# Patient Record
Sex: Male | Born: 1965 | Race: Black or African American | Hispanic: No | Marital: Single | Smoking: Former smoker
Health system: Southern US, Community
[De-identification: ages and names within clinical notes are randomized; demographics above are authoritative.]

## PROBLEM LIST (undated history)

## (undated) DIAGNOSIS — M1990 Unspecified osteoarthritis, unspecified site: Secondary | ICD-10-CM

## (undated) DIAGNOSIS — E119 Type 2 diabetes mellitus without complications: Secondary | ICD-10-CM

## (undated) DIAGNOSIS — M109 Gout, unspecified: Secondary | ICD-10-CM

## (undated) DIAGNOSIS — I10 Essential (primary) hypertension: Secondary | ICD-10-CM

## (undated) DIAGNOSIS — G4730 Sleep apnea, unspecified: Secondary | ICD-10-CM

## (undated) HISTORY — PX: MASS EXCISION: SHX2000

---

## 2000-11-27 ENCOUNTER — Emergency Department (HOSPITAL_COMMUNITY): Admission: EM | Admit: 2000-11-27 | Discharge: 2000-11-27 | Payer: Self-pay | Admitting: Emergency Medicine

## 2001-06-06 ENCOUNTER — Emergency Department (HOSPITAL_COMMUNITY): Admission: EM | Admit: 2001-06-06 | Discharge: 2001-06-06 | Payer: Self-pay | Admitting: Internal Medicine

## 2003-01-07 ENCOUNTER — Encounter: Payer: Self-pay | Admitting: *Deleted

## 2003-01-07 ENCOUNTER — Emergency Department (HOSPITAL_COMMUNITY): Admission: EM | Admit: 2003-01-07 | Discharge: 2003-01-07 | Payer: Self-pay | Admitting: *Deleted

## 2003-10-27 ENCOUNTER — Emergency Department (HOSPITAL_COMMUNITY): Admission: EM | Admit: 2003-10-27 | Discharge: 2003-10-27 | Payer: Self-pay | Admitting: Emergency Medicine

## 2004-01-07 ENCOUNTER — Emergency Department (HOSPITAL_COMMUNITY): Admission: EM | Admit: 2004-01-07 | Discharge: 2004-01-07 | Payer: Self-pay | Admitting: Emergency Medicine

## 2004-01-15 ENCOUNTER — Ambulatory Visit: Admission: RE | Admit: 2004-01-15 | Discharge: 2004-01-15 | Payer: Self-pay | Admitting: Internal Medicine

## 2004-01-16 ENCOUNTER — Ambulatory Visit (HOSPITAL_COMMUNITY): Admission: RE | Admit: 2004-01-16 | Discharge: 2004-01-16 | Payer: Self-pay | Admitting: Internal Medicine

## 2005-01-22 ENCOUNTER — Ambulatory Visit (HOSPITAL_COMMUNITY): Admission: RE | Admit: 2005-01-22 | Discharge: 2005-01-22 | Payer: Self-pay | Admitting: General Surgery

## 2005-06-14 ENCOUNTER — Ambulatory Visit: Payer: Self-pay | Admitting: Pulmonary Disease

## 2005-06-16 ENCOUNTER — Ambulatory Visit (HOSPITAL_BASED_OUTPATIENT_CLINIC_OR_DEPARTMENT_OTHER): Admission: RE | Admit: 2005-06-16 | Discharge: 2005-06-16 | Payer: Self-pay | Admitting: Pulmonary Disease

## 2005-06-21 ENCOUNTER — Ambulatory Visit: Payer: Self-pay | Admitting: Pulmonary Disease

## 2005-08-24 ENCOUNTER — Emergency Department (HOSPITAL_COMMUNITY): Admission: EM | Admit: 2005-08-24 | Discharge: 2005-08-24 | Payer: Self-pay | Admitting: Emergency Medicine

## 2005-11-17 ENCOUNTER — Emergency Department (HOSPITAL_COMMUNITY): Admission: EM | Admit: 2005-11-17 | Discharge: 2005-11-17 | Payer: Self-pay | Admitting: Emergency Medicine

## 2006-05-19 ENCOUNTER — Emergency Department (HOSPITAL_COMMUNITY): Admission: EM | Admit: 2006-05-19 | Discharge: 2006-05-19 | Payer: Self-pay | Admitting: Emergency Medicine

## 2006-09-06 HISTORY — PX: HERNIA REPAIR: SHX51

## 2007-01-05 ENCOUNTER — Emergency Department (HOSPITAL_COMMUNITY): Admission: EM | Admit: 2007-01-05 | Discharge: 2007-01-05 | Payer: Self-pay | Admitting: Emergency Medicine

## 2008-04-10 ENCOUNTER — Encounter: Admission: RE | Admit: 2008-04-10 | Discharge: 2008-04-10 | Payer: Self-pay | Admitting: Family Medicine

## 2008-05-24 ENCOUNTER — Ambulatory Visit (HOSPITAL_COMMUNITY): Admission: RE | Admit: 2008-05-24 | Discharge: 2008-05-24 | Payer: Self-pay | Admitting: Orthopaedic Surgery

## 2008-05-31 ENCOUNTER — Observation Stay (HOSPITAL_COMMUNITY): Admission: RE | Admit: 2008-05-31 | Discharge: 2008-06-01 | Payer: Self-pay | Admitting: General Surgery

## 2009-08-05 ENCOUNTER — Emergency Department (HOSPITAL_COMMUNITY): Admission: EM | Admit: 2009-08-05 | Discharge: 2009-08-06 | Payer: Self-pay | Admitting: Emergency Medicine

## 2010-03-07 ENCOUNTER — Emergency Department (HOSPITAL_COMMUNITY): Admission: EM | Admit: 2010-03-07 | Discharge: 2010-03-07 | Payer: Self-pay | Admitting: Emergency Medicine

## 2010-09-16 ENCOUNTER — Emergency Department (HOSPITAL_COMMUNITY)
Admission: EM | Admit: 2010-09-16 | Discharge: 2010-09-16 | Payer: Self-pay | Source: Home / Self Care | Admitting: Emergency Medicine

## 2011-01-19 NOTE — H&P (Signed)
[name], [name]                   ACCOUNT NO.:  [id number]   MEDICAL RECORD NO.:  [id number]          PATIENT TYPE:  AMB   LOCATION:  DAY                           FACILITY:  [facility]   PHYSICIAN:  Dalia Heading, M.D.  DATE OF BIRTH:  [birth year]   DATE OF ADMISSION:  DATE OF DISCHARGE:  LH                              HISTORY & PHYSICAL   CHIEF COMPLAINT:  Incisional hernia.   HISTORY:  The patient is a 45 year old black male who is referred for  evaluation and treatment of an incisional hernia.  It has been present  for some time.  He had a double-walled mass removed by Dr. Elpidio Anis  in the past.  No fever or chills have been noted.  It is made worse with  straining.  It is enlarging.   PAST MEDICAL HISTORY:  Includes sleep apnea, though he does not use his  CPAP machine all the time, and morbid obesity.   PAST SURGICAL HISTORY:  As noted above.   CURRENT MEDICATIONS:  None.   ALLERGIES:  No known drug allergies.   SOCIAL HISTORY:  The patient smokes half a pack of cigarettes a day.  He  drinks alcohol socially.   REVIEW OF SYSTEMS:  He denies any chest pain, MI, CVA, and diabetes  mellitus.  He denies any bleeding disorders.   PHYSICAL EXAMINATION:  GENERAL:  The patient is a morbidly obese black  male in no acute distress.  LUNGS:  Clear to auscultation with equal breath sounds bilaterally.  HEART:  Regular rate and rhythm without S3, S4 or murmurs.  ABDOMEN:  Soft, nontender, and nondistended.  No hepatosplenomegaly or  masses noted.  A paraumbilical hernia is noted below a surgical scar.  It is greater than 6 cm in size.  It is reducible.   IMPRESSION:  Incisional hernia.   PLAN:  The patient is scheduled for a laparoscopic incisional  herniorrhaphy with mesh on May 31, 2008.  The risks and benefits  of the procedure including bleeding, infection, recurrence of the  hernia, and the possibility of an open procedure were fully explained to  the patient, gave  informed consent.      Dalia Heading, M.D.  Electronically Signed     MAJ/MEDQ  D:  05/16/2008  T:  05/17/2008  Job:  [id number]   cc:   Annia Friendly. Loleta Chance, MD  Fax: [phone]

## 2011-01-19 NOTE — Op Note (Signed)
[name], [name]                   ACCOUNT NO.:  [id number]   MEDICAL RECORD NO.:  [id number]          PATIENT TYPE:  OBV   LOCATION:  IC12                          FACILITY:  [facility]   PHYSICIAN:  Dalia Heading, M.D.  DATE OF BIRTH:  06/03/1966   DATE OF PROCEDURE:  05/31/2008  DATE OF DISCHARGE:                               OPERATIVE REPORT   PREOPERATIVE DIAGNOSIS:  Incisional hernia.   POSTOPERATIVE DIAGNOSIS:  Incisional hernia.   PROCEDURE:  Laparoscopic incisional herniorrhaphy with mesh.   SURGEON:  Dalia Heading, MD   ANESTHESIA:  General endotracheal.   INDICATIONS:  The patient is a 45 year old morbidly obese black male who  presents with an incisional hernia around the umbilicus.  The risks and  benefits of the procedure including bleeding, infection, bowel injury,  and the possibly of recurrence of the hernia were fully explained to the  patient, gave informed consent.   PROCEDURE NOTE:  The patient was placed in the supine position.  After  induction of general endotracheal anesthesia, the abdomen was prepped  and draped using the usual sterile technique with Betadine.  Surgical  site confirmation was performed.   A Veress needle was inserted into the epigastric region without  difficulty.  The abdomen was then insufflated to 15 mmHg pressure.  An  11-mm trocar was placed in the left upper quadrant under direct  visualization without difficulty.  Additional 5-mm trocar was placed in  the right lower quadrant and left lower quadrants as well as a 10-mm  placed in the right upper quadrant.  The patient did have a loop of  small bowel within the hernia.  Omentum was also reduced from the hernia  sac.  A significant amount of greater omentum was excised and removed  from the abdominal cavity using an EndoCatch bag without difficulty.  Once the hernia contents were reduced, the actual diameter of the hernia  was approximately 7-8 cm in its greatest diameter.  A 15-  x 20-cm  Proceed mesh was then inserted.  0-Prolene stay sutures were placed in  four quadrants.  The mesh was then tacked to the abdominal wall using a  protractor.  The bowel was then inspected and no abnormal bleeding or  evidence of perforation was noted.  All air was then evacuated from the  abdominal cavity prior to removal of the trocars.   All wounds were irrigated with normal saline.  All wounds were injected  with 0.5 mL Sensorcaine.  These stab wounds were closed using Dermabond.  The trocar sites were closed using staples.  Betadine ointment and dry  sterile dressings were applied.   All tape and needle counts were correct at the end of the procedure.  The patient was extubated in the operating room and went back to  recovery room awake in stable condition.   COMPLICATIONS:  None.   SPECIMEN:  None.   BLOOD LOSS:  Minimal.      Dalia Heading, M.D.  Electronically Signed     MAJ/MEDQ  D:  05/31/2008  T:  06/01/2008  Job:  [id number]   cc:   Annia Friendly. Loleta Chance, MD  Fax: [phone]

## 2011-01-22 NOTE — H&P (Signed)
[name], [name]                   ACCOUNT NO.:  [id number]   MEDICAL RECORD NO.:  [id number]          PATIENT TYPE:  AMB   LOCATION:  DAY                           FACILITY:  [facility]   PHYSICIAN:  Jerolyn Shin C. Katrinka Blazing, M.D.   DATE OF BIRTH:  [birth year]   DATE OF ADMISSION:  DATE OF DISCHARGE:  LH                                HISTORY & PHYSICAL   HISTORY OF PRESENT ILLNESS:  A 45 year old male with history of a chronic  mass of the left medial thigh which has gradually increased in size.  The  patient has been followed with this for many months.  He was felt to have  significant inflammation and he has been treated with cephalosporins with  some improvement.  The major inflammation has dissolved.  The patient is  scheduled to have the mass primarily excised and the area closed.  The  irritation that he has is felt to be caused by his large size and the  pressure from his thighs rubbing together.   PAST MEDICAL HISTORY:  1.  Hypertension.  2.  Gouty arthritis.  3.  Morbid obesity.   MEDICATIONS:  1.  Lotrel 5/10 daily.  2.  Indocin 25 mg t.i.d.  3.  Vicodin 5/500 q.i.d. p.r.n.   His hypertension has been poorly controlled because of patient non-  compliance.   PAST SURGICAL HISTORY:  None.   ALLERGIES:  None.   PHYSICAL EXAMINATION:  VITAL SIGNS:  Blood pressure 148/100, pulse 96,  respirations 20.  HEENT:  Unremarkable.  NECK:  Supple.  No JVD, bruits, adenopathy, or thyromegaly.  CHEST:  Clear.  HEART:  Regular rate and rhythm without murmur, gallop or rub.  ABDOMEN:  Obese.  Soft, nontender.  No masses.  EXTREMITIES:  Large fleshy mass of the left medial thigh with irritation of  the tip but without cellulitis.  2+ edema of the legs.   IMPRESSION:  1.  Soft tissue mass, left medial thigh.  2.  Hypertension.  3.  Gouty arthritis.   PLAN:  Excision of mass under anesthesia.      LCS/MEDQ  D:  01/21/2005  T:  01/22/2005  Job:  [id number]

## 2011-01-22 NOTE — Op Note (Signed)
NAMEJATIN, NAUMANN                   ACCOUNT NO.:  [id number]   MEDICAL RECORD NO.:  [id number]          PATIENT TYPE:  AMB   LOCATION:  DAY                           FACILITY:  [facility]   PHYSICIAN:  Jerolyn Shin C. Katrinka Blazing, M.D.   DATE OF BIRTH:  [birth year]   DATE OF PROCEDURE:  01/22/2005  DATE OF DISCHARGE:                                 OPERATIVE REPORT   PREOPERATIVE DIAGNOSIS:  Soft tissue mass, left upper medial thigh with  secondary inflammation.   POSTOPERATIVE DIAGNOSIS:  Soft tissue mass, left upper medial thigh with  secondary inflammation.   PROCEDURE:  Wide excision and primary closure of soft tissue mass, left  upper medial thigh, 4 x 5 cm.   SURGEON:  Dr. Katrinka Blazing.   DESCRIPTION:  The patient was ______________ with monitored anesthesia care  with heavy IV sedation. He was placed in high lithotomy position and was  prepped and draped in a sterile field. Local infiltration with 1% Xylocaine  with epinephrine was carried out. Wide excision around the mass was carried  out in an elliptical pattern, extending into the deep subcutaneous tissue.  Subcutaneous tissue was then closed using running 3-0 Monocryl in layers.  The skin was closed with 2-0 chromic because of the need to not have sutures  in this area because of the maceration from sweat. The patient tolerated the  procedure well. Dressing was placed. He was transferred to a bed and taken  to the post-anesthesia care unit for monitoring.      LCS/MEDQ  D:  01/22/2005  T:  01/22/2005  Job:  [id number]

## 2011-01-22 NOTE — Procedures (Signed)
NAME:  [name], [name] NO.:  [id number]   MEDICAL RECORD NO.:  [id number]          PATIENT TYPE:  OUT   LOCATION:  SLEEP CENTER                 FACILITY:  [facility]   PHYSICIAN:  Coralyn Helling, M.D.      DATE OF BIRTH:  Jan 22, 1966   DATE OF STUDY:  06/16/2005                          MAINTENANCE OF WAKEFULNESS TEST   INDICATION FOR STUDY:  The patient is here with severe obstructive sleep  apnea on CPAP therapy to document any evidence of residual excessive daytime  somnolence.   The patient underwent Maintenance Wakefulness Test which consisted of a  series of 4 nap sessions which were each 20 minutes in duration.   Nap #1 was from 8 a.m. to 8:20 a.m.: No sleep was observed.   Nap #2 was from 10 a.m. to 10:20 a.m.: No sleep was observed.   Nap #3 was from 12 p.m. to 12:20 p.m.: No sleep was observed.   Nap #4 was from 2 p.m. to 2:20 p.m.: No sleep was observed.   IMPRESSION:  This Maintenance Wakefulness Test does not show evidence of  excessive daytime sleep as no sleep was observed in any of the nap sessions.  Clinical correlation will be required.      Coralyn Helling, M.D.  Diplomat, Biomedical engineer of Sleep Medicine  Electronically Signed     VS/MEDQ  D:  06/21/2005 14:50:36  T:  06/21/2005 15:20:25  Job:  [id number]

## 2011-06-07 LAB — CBC
HCT: 42.2
Hemoglobin: 13.8
Hemoglobin: 15
RBC: 4.94
RBC: 5.41
RDW: 15.3
RDW: 15.5

## 2011-06-07 LAB — BASIC METABOLIC PANEL
Calcium: 9.2
GFR calc Af Amer: >60
GFR calc Af Amer: >60
GFR calc non Af Amer: >60
GFR calc non Af Amer: >60
Glucose, Bld: 112 — ABNORMAL HIGH
Glucose, Bld: 93
Potassium: 4
Sodium: 138
Sodium: 138

## 2011-06-07 LAB — DIFFERENTIAL
Basophils Absolute: 0
Eosinophils Relative: 1
Lymphocytes Relative: 19
Lymphs Abs: 2.9
Monocytes Absolute: 1.8 — ABNORMAL HIGH
Monocytes Relative: 12

## 2012-10-16 ENCOUNTER — Other Ambulatory Visit (HOSPITAL_COMMUNITY): Payer: Self-pay | Admitting: Family Medicine

## 2012-10-16 ENCOUNTER — Ambulatory Visit (HOSPITAL_COMMUNITY)
Admission: RE | Admit: 2012-10-16 | Discharge: 2012-10-16 | Disposition: A | Discharge status: Home / Self Care | Payer: BC Managed Care – PPO | Source: Ambulatory Visit | Attending: Family Medicine | Admitting: Family Medicine

## 2012-10-16 DIAGNOSIS — R0602 Shortness of breath: Secondary | ICD-10-CM

## 2012-10-16 LAB — BLOOD GAS, ARTERIAL
Acid-base deficit: 1.5 mmol/L (ref 0.0–2.0)
Bicarbonate: 22.6 mEq/L (ref 20.0–24.0)
O2 Saturation: 95.3 %
TCO2: 19.8 mmol/L (ref 0–100)
pCO2 arterial: 37.4 mmHg (ref 35.0–45.0)
pO2, Arterial: 75.8 mmHg — ABNORMAL LOW (ref 80.0–100.0)

## 2013-05-24 ENCOUNTER — Encounter (HOSPITAL_COMMUNITY): Payer: Self-pay | Admitting: *Deleted

## 2013-05-24 ENCOUNTER — Emergency Department (HOSPITAL_COMMUNITY): Payer: BC Managed Care – PPO

## 2013-05-24 ENCOUNTER — Emergency Department (HOSPITAL_COMMUNITY)
Admission: EM | Admit: 2013-05-24 | Discharge: 2013-05-24 | Disposition: A | Discharge status: Home / Self Care | Payer: BC Managed Care – PPO | Attending: Emergency Medicine | Admitting: Emergency Medicine

## 2013-05-24 DIAGNOSIS — Z862 Personal history of diseases of the blood and blood-forming organs and certain disorders involving the immune mechanism: Secondary | ICD-10-CM | POA: Insufficient documentation

## 2013-05-24 DIAGNOSIS — Z8639 Personal history of other endocrine, nutritional and metabolic disease: Secondary | ICD-10-CM | POA: Insufficient documentation

## 2013-05-24 DIAGNOSIS — M25569 Pain in unspecified knee: Secondary | ICD-10-CM | POA: Insufficient documentation

## 2013-05-24 DIAGNOSIS — M2559 Pain in other specified joint: Secondary | ICD-10-CM | POA: Insufficient documentation

## 2013-05-24 DIAGNOSIS — I10 Essential (primary) hypertension: Secondary | ICD-10-CM | POA: Insufficient documentation

## 2013-05-24 DIAGNOSIS — IMO0001 Reserved for inherently not codable concepts without codable children: Secondary | ICD-10-CM | POA: Insufficient documentation

## 2013-05-24 DIAGNOSIS — G8929 Other chronic pain: Secondary | ICD-10-CM | POA: Insufficient documentation

## 2013-05-24 DIAGNOSIS — E669 Obesity, unspecified: Secondary | ICD-10-CM | POA: Insufficient documentation

## 2013-05-24 DIAGNOSIS — Z79899 Other long term (current) drug therapy: Secondary | ICD-10-CM | POA: Insufficient documentation

## 2013-05-24 HISTORY — DX: Unspecified osteoarthritis, unspecified site: M19.90

## 2013-05-24 HISTORY — DX: Essential (primary) hypertension: I10

## 2013-05-24 HISTORY — DX: Gout, unspecified: M10.9

## 2013-05-24 LAB — CBC WITH DIFFERENTIAL/PLATELET
Basophils Relative: 0 % (ref 0–1)
Eosinophils Relative: 1 % (ref 0–5)
Hemoglobin: 14.7 g/dL (ref 13.0–17.0)
Lymphs Abs: 3.6 10*3/uL (ref 0.7–4.0)
MCH: 27.6 pg (ref 26.0–34.0)
MCV: 86.5 fL (ref 78.0–100.0)
Monocytes Absolute: 1.1 10*3/uL — ABNORMAL HIGH (ref 0.1–1.0)
Monocytes Relative: 8 % (ref 3–12)
RBC: 5.33 MIL/uL (ref 4.22–5.81)
WBC: 13.2 10*3/uL — ABNORMAL HIGH (ref 4.0–10.5)

## 2013-05-24 LAB — BASIC METABOLIC PANEL
CO2: 29 mEq/L (ref 19–32)
Calcium: 9.5 mg/dL (ref 8.4–10.5)
Chloride: 101 mEq/L (ref 96–112)
Creatinine, Ser: 0.83 mg/dL (ref 0.50–1.35)
Glucose, Bld: 132 mg/dL — ABNORMAL HIGH (ref 70–99)
Sodium: 138 mEq/L (ref 135–145)

## 2013-05-24 LAB — URIC ACID: Uric Acid, Serum: 8.6 mg/dL — ABNORMAL HIGH (ref 4.0–7.8)

## 2013-05-24 MED ORDER — IBUPROFEN 800 MG PO TABS: 800.0000 mg | ORAL_TABLET | Freq: Three times a day (TID) | ORAL | Status: DC

## 2013-05-24 MED ORDER — IBUPROFEN 800 MG PO TABS
800.0000 mg | ORAL_TABLET | Freq: Once | ORAL | Status: AC
  Administered 2013-05-24: 800 mg via ORAL
  Filled 2013-05-24: qty 1

## 2013-05-24 MED ORDER — OXYCODONE-ACETAMINOPHEN 5-325 MG PO TABS: 2.0000 | ORAL_TABLET | ORAL | Status: DC | PRN

## 2013-05-24 MED ORDER — OXYCODONE-ACETAMINOPHEN 5-325 MG PO TABS
2.0000 | ORAL_TABLET | Freq: Once | ORAL | Status: AC
  Administered 2013-05-24: 2 via ORAL
  Filled 2013-05-24: qty 2

## 2013-05-24 NOTE — ED Notes (Signed)
Pt states right knee pain x 2 days with hx of arthritis and gout to the knee.

## 2013-05-24 NOTE — ED Provider Notes (Signed)
CSN: [id number]     Arrival date & time 05/24/13  1512 History   First MD Initiated Contact with Patient 05/24/13 1551     Chief Complaint  Patient presents with  . Knee Pain  . Hypertension   (Consider location/radiation/quality/duration/timing/severity/associated sxs/prior Treatment) HPI Comments: R knee pain x 3 days without fall or injury.  Patient is obese, states he has gout and arthritis and this feels similar.  Last "flare" was several months ago when he got a cortisone shot.  Has pain with movement and weight bearing.  No fever or vomiting.  No chest pain, SOB.  States has "borderline" hypertension and has never been on BP meds.  Denies headache or visual changes.  The history is provided by the patient.    Past Medical History  Diagnosis Date  . Gout   . Arthritis   . Hypertension     boarderline   Past Surgical History  Procedure Laterality Date  . Hernia repair     No family history on file. History  Substance Use Topics  . Smoking status: Never Smoker   . Smokeless tobacco: Not on file  . Alcohol Use: No    Review of Systems  Constitutional: Negative for fever, activity change and appetite change.  HENT: Negative for congestion and rhinorrhea.   Respiratory: Negative for cough, chest tightness and shortness of breath.   Cardiovascular: Negative for chest pain.  Gastrointestinal: Negative for nausea, vomiting and abdominal pain.  Genitourinary: Negative for dysuria.  Musculoskeletal: Positive for myalgias and arthralgias. Negative for back pain.  Skin: Negative for rash.  Neurological: Negative for dizziness, weakness and headaches.  A complete 10 system review of systems was obtained and all systems are negative except as noted in the HPI and PMH.    Allergies  Review of patient's allergies indicates no known allergies.  Home Medications   Current Outpatient Rx  Name  Route  Sig  Dispense  Refill  . ibuprofen (ADVIL,MOTRIN) 800 MG tablet   Oral    Take 1 tablet (800 mg total) by mouth 3 (three) times daily.   21 tablet   0   . oxyCODONE-acetaminophen (PERCOCET/ROXICET) 5-325 MG per tablet   Oral   Take 2 tablets by mouth every 4 (four) hours as needed for pain.   15 tablet   0    BP 166/90  Pulse 80  Temp(Src) 98.3 F (36.8 C) (Oral)  Resp 20  Ht 5\' 8"  (1.727 m)  Wt 370 lb (167.831 kg)  BMI 56.27 kg/m2  SpO2 99% Physical Exam  Constitutional: He is oriented to person, place, and time. He appears well-developed and well-nourished. No distress.  HENT:  Head: Normocephalic and atraumatic.  Mouth/Throat: Oropharynx is clear and moist. No oropharyngeal exudate.  Eyes: EOM are normal. Pupils are equal, round, and reactive to light.  Neck: Normal range of motion. Neck supple.  Cardiovascular: Normal rate, regular rhythm and normal heart sounds.   No murmur heard. Pulmonary/Chest: Effort normal and breath sounds normal. No respiratory distress.  Abdominal: Soft. There is no tenderness. There is no rebound and no guarding.  Musculoskeletal: He exhibits no edema and no tenderness.  No warmth or erythema to R knee.  Slightly reduced ROM 2/2 pain.  Intact DP and PT pulses.  Ankle flexion and extension intact.  Neurological: He is oriented to person, place, and time. No cranial nerve deficit. He exhibits normal muscle tone. Coordination normal.  Skin: Skin is warm.    ED Course  Procedures (including critical care time) Labs Review Labs Reviewed  CBC WITH DIFFERENTIAL - Abnormal; Notable for the following:    WBC 13.2 (*)    Neutro Abs 8.4 (*)    Monocytes Absolute 1.1 (*)    All other components within normal limits  BASIC METABOLIC PANEL - Abnormal; Notable for the following:    Glucose, Bld 132 (*)    All other components within normal limits  URIC ACID - Abnormal; Notable for the following:    Uric Acid, Serum 8.6 (*)    All other components within normal limits   Imaging Review Dg Knee Complete 4 Views  Right  05/24/2013   CLINICAL DATA:  Knee pain  EXAM: RIGHT KNEE - COMPLETE 4+ VIEW  COMPARISON:  None.  FINDINGS: Negative for fracture. Medial lateral joint spaces are normal. There is patellar spurring without joint space narrowing. Negative for effusion.  IMPRESSION: Mild patellar spurring. No acute abnormality.   Electronically Signed   By: Marlan Palau M.D.   On: 05/24/2013 16:34    MDM   1. Knee pain, chronic, right   2. Hypertension    Knee pain x 3 days, history of gout and arthritis. Denies trauma, fever, vomiting.  Feels similar to previous gout attacks.  X-rays negative for effusion. Patient's exam shows no warmth, erythema or cellulitis. Range of motion of the knee is mildly reduced. Do not suspect septic joint.  Patient's blood pressure has improved spontaneously to 160/90 systolic. Advised to followup with PCP regarding elevated blood pressure. We'll treat knee pain with short course of pain medication and anti-inflammatories. Return precautions discussed   Glynn Octave, MD 05/24/13 1730

## 2014-05-28 ENCOUNTER — Emergency Department (HOSPITAL_COMMUNITY)
Admission: EM | Admit: 2014-05-28 | Discharge: 2014-05-28 | Disposition: A | Discharge status: Home / Self Care | Payer: 59 | Attending: Emergency Medicine | Admitting: Emergency Medicine

## 2014-05-28 ENCOUNTER — Emergency Department (HOSPITAL_COMMUNITY): Payer: 59

## 2014-05-28 ENCOUNTER — Encounter (HOSPITAL_COMMUNITY): Payer: Self-pay | Admitting: Emergency Medicine

## 2014-05-28 DIAGNOSIS — M10072 Idiopathic gout, left ankle and foot: Secondary | ICD-10-CM

## 2014-05-28 DIAGNOSIS — Z79899 Other long term (current) drug therapy: Secondary | ICD-10-CM | POA: Insufficient documentation

## 2014-05-28 DIAGNOSIS — M109 Gout, unspecified: Secondary | ICD-10-CM | POA: Insufficient documentation

## 2014-05-28 DIAGNOSIS — M129 Arthropathy, unspecified: Secondary | ICD-10-CM | POA: Diagnosis not present

## 2014-05-28 DIAGNOSIS — M79609 Pain in unspecified limb: Secondary | ICD-10-CM | POA: Insufficient documentation

## 2014-05-28 DIAGNOSIS — I10 Essential (primary) hypertension: Secondary | ICD-10-CM | POA: Diagnosis not present

## 2014-05-28 MED ORDER — HYDROMORPHONE HCL 1 MG/ML IJ SOLN
1.0000 mg | Freq: Once | INTRAMUSCULAR | Status: AC
  Administered 2014-05-28: 1 mg via INTRAMUSCULAR
  Filled 2014-05-28: qty 1

## 2014-05-28 MED ORDER — LISINOPRIL 20 MG PO TABS: 20.0000 mg | ORAL_TABLET | Freq: Every day | ORAL | Status: DC

## 2014-05-28 MED ORDER — LISINOPRIL 10 MG PO TABS
20.0000 mg | ORAL_TABLET | Freq: Once | ORAL | Status: AC
  Administered 2014-05-28: 20 mg via ORAL
  Filled 2014-05-28: qty 2

## 2014-05-28 MED ORDER — PREDNISONE 10 MG PO TABS: 20.0000 mg | ORAL_TABLET | Freq: Every day | ORAL | Status: DC

## 2014-05-28 MED ORDER — PREDNISONE 50 MG PO TABS
60.0000 mg | ORAL_TABLET | Freq: Once | ORAL | Status: AC
  Administered 2014-05-28: 60 mg via ORAL
  Filled 2014-05-28 (×2): qty 1

## 2014-05-28 MED ORDER — OXYCODONE-ACETAMINOPHEN 5-325 MG PO TABS: 1.0000 | ORAL_TABLET | Freq: Four times a day (QID) | ORAL | Status: DC | PRN

## 2014-05-28 NOTE — ED Notes (Addendum)
Explained importance of prompt follow up with pcp concerning bp.  Pt verbalized understanding.  Discussed risks of untreated high bp and discussed symptoms that would mean pt needs to return to ER immediately.  EDP aware of bp.

## 2014-05-28 NOTE — ED Notes (Addendum)
Patient complaining of right foot pain since yesterday. States "I think it is gout." Patient's B/P 141/124 at triage.

## 2014-05-28 NOTE — ED Provider Notes (Signed)
CSN: [id number]     Arrival date & time 05/28/14  1324 History   First MD Initiated Contact with Patient 05/28/14 1347     Chief Complaint  Patient presents with  . Foot Pain     (Consider location/radiation/quality/duration/timing/severity/associated sxs/prior Treatment) Patient is a 48 y.o. male presenting with lower extremity pain. The history is provided by the patient (pt complains of left foot pain).  Foot Pain This is a recurrent problem. The current episode started more than 2 days ago. The problem occurs constantly. The problem has not changed since onset.Pertinent negatives include no chest pain, no abdominal pain and no headaches. Nothing aggravates the symptoms. Nothing relieves the symptoms.    Past Medical History  Diagnosis Date  . Gout   . Arthritis   . Hypertension     boarderline   Past Surgical History  Procedure Laterality Date  . Hernia repair     History reviewed. No pertinent family history. History  Substance Use Topics  . Smoking status: Never Smoker   . Smokeless tobacco: Not on file  . Alcohol Use: No    Review of Systems  Constitutional: Negative for appetite change and fatigue.  HENT: Negative for congestion, ear discharge and sinus pressure.   Eyes: Negative for discharge.  Respiratory: Negative for cough.   Cardiovascular: Negative for chest pain.  Gastrointestinal: Negative for abdominal pain and diarrhea.  Genitourinary: Negative for frequency and hematuria.  Musculoskeletal: Negative for back pain.       Pain left foot  Skin: Negative for rash.  Neurological: Negative for seizures and headaches.  Psychiatric/Behavioral: Negative for hallucinations.      Allergies  Review of patient's allergies indicates no known allergies.  Home Medications   Prior to Admission medications   Medication Sig Start Date End Date Taking? Authorizing Provider  allopurinol (ZYLOPRIM) 300 MG tablet Take 300 mg by mouth daily.   Yes Historical  Provider, MD  indomethacin (INDOCIN) 50 MG capsule Take 50 mg by mouth 2 (two) times daily with a meal.   Yes Historical Provider, MD  lisinopril (PRINIVIL,ZESTRIL) 20 MG tablet Take 1 tablet (20 mg total) by mouth daily. 05/28/14   Maudry Diego, MD  oxyCODONE-acetaminophen (PERCOCET/ROXICET) 5-325 MG per tablet Take 1 tablet by mouth every 6 (six) hours as needed. 05/28/14   Maudry Diego, MD  predniSONE (DELTASONE) 10 MG tablet Take 2 tablets (20 mg total) by mouth daily. 05/28/14   Maudry Diego, MD   BP 181/123  Pulse 81  Temp(Src) 97.8 F (36.6 C) (Oral)  Resp 16  Ht 5\' 8"  (1.727 m)  Wt 350 lb (158.759 kg)  BMI 53.23 kg/m2  SpO2 98% Physical Exam  Constitutional: He is oriented to person, place, and time. He appears well-developed.  HENT:  Head: Normocephalic.  Eyes: Conjunctivae are normal.  Neck: No tracheal deviation present.  Cardiovascular:  No murmur heard. Musculoskeletal:  Tender right ankle  Neurological: He is oriented to person, place, and time.  Skin: Skin is warm.  Psychiatric: He has a normal mood and affect.    ED Course  Procedures (including critical care time) Labs Review Labs Reviewed - No data to display  Imaging Review Dg Ankle Complete Right  05/28/2014   CLINICAL DATA:  Right ankle pain and swelling.  EXAM: RIGHT ANKLE - COMPLETE 3+ VIEW  COMPARISON:  None.  FINDINGS: There is no evidence of fracture, dislocation, or joint effusion. No significant joint space narrowing is noted. Minimal osteophyte formation  is noted posteriorly in the talotibial joint. Talar dome appears intact. Soft tissues are unremarkable.  IMPRESSION: Minimal degenerative changes are noted. No acute abnormality seen in the right ankle.   Electronically Signed   By: Sabino Dick M.D.   On: 05/28/2014 15:03     EKG Interpretation None      MDM   Final diagnoses:  Acute idiopathic gout of left ankle  Essential hypertension    Gout,   Prednisone,  Percocet       Maudry Diego, MD 05/28/14 1554

## 2014-05-28 NOTE — Discharge Instructions (Signed)
Follow up with your md in one week.   Get seen sooner if problems

## 2014-08-06 ENCOUNTER — Encounter (HOSPITAL_COMMUNITY): Payer: Self-pay | Admitting: Emergency Medicine

## 2014-08-06 ENCOUNTER — Emergency Department (HOSPITAL_COMMUNITY)
Admission: EM | Admit: 2014-08-06 | Discharge: 2014-08-06 | Disposition: A | Discharge status: Home / Self Care | Payer: 59 | Attending: Emergency Medicine | Admitting: Emergency Medicine

## 2014-08-06 DIAGNOSIS — I10 Essential (primary) hypertension: Secondary | ICD-10-CM | POA: Insufficient documentation

## 2014-08-06 DIAGNOSIS — M1990 Unspecified osteoarthritis, unspecified site: Secondary | ICD-10-CM | POA: Diagnosis not present

## 2014-08-06 DIAGNOSIS — M10072 Idiopathic gout, left ankle and foot: Secondary | ICD-10-CM

## 2014-08-06 DIAGNOSIS — M109 Gout, unspecified: Secondary | ICD-10-CM | POA: Diagnosis present

## 2014-08-06 DIAGNOSIS — Z792 Long term (current) use of antibiotics: Secondary | ICD-10-CM | POA: Insufficient documentation

## 2014-08-06 MED ORDER — INDOMETHACIN 50 MG PO CAPS: 50.0000 mg | ORAL_CAPSULE | Freq: Three times a day (TID) | ORAL | Status: DC

## 2014-08-06 MED ORDER — OXYCODONE-ACETAMINOPHEN 5-325 MG PO TABS: 1.0000 | ORAL_TABLET | Freq: Three times a day (TID) | ORAL | Status: DC | PRN

## 2014-08-06 NOTE — ED Notes (Addendum)
Pt reports history of gout. Pt denies any new or recent injury. Pt reports "it feels like my gout flaring up." pt reports bilateral knee pain and right ankle pain.moderate swelling noted to right ankle.

## 2014-08-06 NOTE — Discharge Instructions (Signed)
Gout °Gout is when your joints become red, sore, and swell (inflamed). This is caused by the buildup of uric acid crystals in the joints. Uric acid is a chemical that is normally in the blood. If the level of uric acid gets too high in the blood, these crystals form in your joints and tissues. Over time, these crystals can form into masses near the joints and tissues. These masses can destroy bone and cause the bone to look misshapen (deformed). °HOME CARE  °· Do not take aspirin for pain. °· Only take medicine as told by your doctor. °· Rest the joint as much as you can. When in bed, keep sheets and blankets off painful areas. °· Keep the sore joints raised (elevated). °· Put warm or cold packs on painful joints. Use of warm or cold packs depends on which works best for you. °· Use crutches if the painful joint is in your leg. °· Drink enough fluids to keep your pee (urine) clear or pale yellow. Limit alcohol, sugary drinks, and drinks with fructose in them. °· Follow your diet instructions. Pay careful attention to how much protein you eat. Include fruits, vegetables, whole grains, and fat-free or low-fat milk products in your daily diet. Talk to your doctor or dietitian about the use of coffee, vitamin C, and cherries. These may help lower uric acid levels. °· Keep a healthy body weight. °GET HELP RIGHT AWAY IF:  °· You have watery poop (diarrhea), throw up (vomit), or have any side effects from medicines. °· You do not feel better in 24 hours, or you are getting worse. °· Your joint becomes suddenly more tender, and you have chills or a fever. °MAKE SURE YOU:  °· Understand these instructions. °· Will watch your condition. °· Will get help right away if you are not doing well or get worse. °Document Released: 06/01/2008 Document Revised: 01/07/2014 Document Reviewed: 04/05/2012 °[employer]® Patient Information ©2015 [employer]. This information is not intended to replace advice given to you by your health care  provider. Make sure you discuss any questions you have with your health care provider. ° °

## 2014-08-06 NOTE — ED Provider Notes (Signed)
CSN: [id number]     Arrival date & time 08/06/14  1400 History   First MD Initiated Contact with Patient 08/06/14 1434     Chief Complaint  Patient presents with  . Gout     (Consider location/radiation/quality/duration/timing/severity/associated sxs/prior Treatment) HPI Comments: Pt comes in today with complaint of flare up of gout in the right ankle. Pt states that he has been having pain and swelling to the area times a couple of days. Hasn't taken anything for the symptoms. Pt is also complaining of bilateral knee pain. He doesn't have a pcp.  The history is provided by the patient. No language interpreter was used.    Past Medical History  Diagnosis Date  . Gout   . Arthritis   . Hypertension     boarderline   Past Surgical History  Procedure Laterality Date  . Hernia repair     History reviewed. No pertinent family history. History  Substance Use Topics  . Smoking status: Never Smoker   . Smokeless tobacco: Not on file  . Alcohol Use: No    Review of Systems  All other systems reviewed and are negative.     Allergies  Review of patient's allergies indicates no known allergies.  Home Medications   Prior to Admission medications   Medication Sig Start Date End Date Taking? Authorizing Provider  indomethacin (INDOCIN) 50 MG capsule Take 1 capsule (50 mg total) by mouth 3 (three) times daily with meals. 08/06/14   Glendell Docker, NP  oxyCODONE-acetaminophen (PERCOCET/ROXICET) 5-325 MG per tablet Take 1-2 tablets by mouth every 8 (eight) hours as needed for moderate pain or severe pain. 08/06/14   Glendell Docker, NP   BP 161/103 mmHg  Pulse 98  Temp(Src) 97.9 F (36.6 C) (Oral)  Resp 20  Ht 5\' 8"  (1.727 m)  Wt 360 lb (163.295 kg)  BMI 54.75 kg/m2  SpO2 98% Physical Exam  Constitutional: He is oriented to person, place, and time. He appears well-developed and well-nourished.  Cardiovascular: Normal rate and regular rhythm.   Musculoskeletal:  Swelling  noted to the right ankle. No redness or warmth noted. No swelling redness or warmth noted to knees bilaterally  Neurological: He is alert and oriented to person, place, and time.  Skin: Skin is warm and dry.  Nursing note and vitals reviewed.   ED Course  Procedures (including critical care time) Labs Review Labs Reviewed - No data to display  Imaging Review No results found.   EKG Interpretation None      MDM   Final diagnoses:  Acute idiopathic gout of left ankle    Pt treated for gout with indocin and hydrocodone;no fevers:discussed high bp with pt    Glendell Docker, NP 08/06/14 [place], MD 08/06/14 1610

## 2014-11-15 ENCOUNTER — Emergency Department (HOSPITAL_COMMUNITY)
Admission: EM | Admit: 2014-11-15 | Discharge: 2014-11-15 | Disposition: A | Discharge status: Home / Self Care | Payer: 59 | Attending: Emergency Medicine | Admitting: Emergency Medicine

## 2014-11-15 ENCOUNTER — Encounter (HOSPITAL_COMMUNITY): Payer: Self-pay | Admitting: *Deleted

## 2014-11-15 DIAGNOSIS — M1990 Unspecified osteoarthritis, unspecified site: Secondary | ICD-10-CM | POA: Insufficient documentation

## 2014-11-15 DIAGNOSIS — W458XXA Other foreign body or object entering through skin, initial encounter: Secondary | ICD-10-CM | POA: Insufficient documentation

## 2014-11-15 DIAGNOSIS — S60351A Superficial foreign body of right thumb, initial encounter: Secondary | ICD-10-CM | POA: Diagnosis not present

## 2014-11-15 DIAGNOSIS — Y9389 Activity, other specified: Secondary | ICD-10-CM | POA: Insufficient documentation

## 2014-11-15 DIAGNOSIS — I10 Essential (primary) hypertension: Secondary | ICD-10-CM | POA: Diagnosis not present

## 2014-11-15 DIAGNOSIS — Y998 Other external cause status: Secondary | ICD-10-CM | POA: Diagnosis not present

## 2014-11-15 DIAGNOSIS — Z79899 Other long term (current) drug therapy: Secondary | ICD-10-CM | POA: Diagnosis not present

## 2014-11-15 DIAGNOSIS — Y9289 Other specified places as the place of occurrence of the external cause: Secondary | ICD-10-CM | POA: Insufficient documentation

## 2014-11-15 DIAGNOSIS — M795 Residual foreign body in soft tissue: Secondary | ICD-10-CM

## 2014-11-15 MED ORDER — LIDOCAINE HCL (PF) 1 % IJ SOLN
INTRAMUSCULAR | Status: AC
  Filled 2014-11-15: qty 5

## 2014-11-15 MED ORDER — CEPHALEXIN 500 MG PO CAPS
500.0000 mg | ORAL_CAPSULE | Freq: Once | ORAL | Status: AC
  Administered 2014-11-15: 500 mg via ORAL

## 2014-11-15 MED ORDER — CEPHALEXIN 500 MG PO CAPS
ORAL_CAPSULE | ORAL | Status: AC
  Filled 2014-11-15: qty 1

## 2014-11-15 MED ORDER — CEPHALEXIN 250 MG PO CAPS: 250.0000 mg | ORAL_CAPSULE | Freq: Two times a day (BID) | ORAL | Status: DC

## 2014-11-15 MED ORDER — CEPHALEXIN 250 MG PO CAPS
250.0000 mg | ORAL_CAPSULE | ORAL | Status: DC
Start: 2014-11-15 — End: 2014-11-15
  Filled 2014-11-15: qty 1

## 2014-11-15 MED ORDER — LIDOCAINE HCL (PF) 1 % IJ SOLN
5.0000 mL | Freq: Once | INTRAMUSCULAR | Status: AC
  Administered 2014-11-15: 5 mL

## 2014-11-15 NOTE — Discharge Instructions (Signed)
Small superficial abscess where the splinter was festering this is been opened and drained been started on antibiotic to prevent further infection.  Please follow-up with your primary care physician Please soaking finger as follows take 1/4 teaspoon of salt and placed in a cup of warm water when the water is typical.  Please soaking finger for approximately 10 minutes, 3-4 times a day

## 2014-11-15 NOTE — ED Notes (Signed)
Pt states splinter to right thumb on Tuesday. Pt states increased pain to the thumb.

## 2014-11-15 NOTE — ED Notes (Signed)
Wooden splinter of rt thumb since Tuesday.  Pt tried to remove without success.

## 2014-11-15 NOTE — ED Provider Notes (Signed)
CSN: [id number]     Arrival date & time 11/15/14  1516 History   First MD Initiated Contact with Patient 11/15/14 1559     Chief Complaint  Patient presents with  . Finger Injury     (Consider location/radiation/quality/duration/timing/severity/associated sxs/prior Treatment) HPI Comments: This patient has a wood splinter in the DIP joint of his right thumb since Tuesday.  He did initially try to remove the splinter with tweezers but was unsuccessful.  Since that time, no treatment has been tried  The history is provided by the patient.    Past Medical History  Diagnosis Date  . Gout   . Arthritis   . Hypertension     boarderline   Past Surgical History  Procedure Laterality Date  . Hernia repair     No family history on file. History  Substance Use Topics  . Smoking status: Never Smoker   . Smokeless tobacco: Not on file  . Alcohol Use: No    Review of Systems  Constitutional: Negative for fever.  Musculoskeletal: Negative for joint swelling.  Skin: Positive for wound.  All other systems reviewed and are negative.     Allergies  Review of patient's allergies indicates no known allergies.  Home Medications   Prior to Admission medications   Medication Sig Start Date End Date Taking? Authorizing Provider  traMADol (ULTRAM) 50 MG tablet Take 50 mg by mouth 2 (two) times daily. 11/07/14  Yes Historical Provider, MD  cephALEXin (KEFLEX) 250 MG capsule Take 1 capsule (250 mg total) by mouth 2 (two) times daily. 11/15/14   Junius Creamer, NP  indomethacin (INDOCIN) 50 MG capsule Take 1 capsule (50 mg total) by mouth 3 (three) times daily with meals. Patient not taking: Reported on 11/15/2014 08/06/14   Glendell Docker, NP  lisinopril (PRINIVIL,ZESTRIL) 5 MG tablet Take 5 mg by mouth daily. 11/07/14   Historical Provider, MD  oxyCODONE-acetaminophen (PERCOCET) 7.5-325 MG per tablet Take 1 tablet by mouth every 4 (four) hours as needed. 10/18/14   Historical Provider, MD   oxyCODONE-acetaminophen (PERCOCET/ROXICET) 5-325 MG per tablet Take 1-2 tablets by mouth every 8 (eight) hours as needed for moderate pain or severe pain. Patient not taking: Reported on 11/15/2014 08/06/14   Glendell Docker, NP  zolpidem (AMBIEN) 10 MG tablet Take 10 mg by mouth at bedtime. 7 day prescribed on 11/07/2014 11/07/14   Historical Provider, MD   BP 133/86 mmHg  Pulse 110  Temp(Src) 98.4 F (36.9 C) (Oral)  Resp 20  Ht 5\' 7"  (1.702 m)  Wt 378 lb (171.46 kg)  BMI 59.19 kg/m2  SpO2 99% Physical Exam  Constitutional: He appears well-developed and well-nourished.  Morbidly obese  HENT:  Head: Normocephalic.  Eyes: Pupils are equal, round, and reactive to light.  Neck: Normal range of motion.  Cardiovascular: Normal rate and regular rhythm.   Pulmonary/Chest: Effort normal.  Musculoskeletal: He exhibits tenderness. He exhibits no edema.  Small festering area medial palmar surface of the right thumb in the DIP joint  Neurological: He is alert.  Skin: Skin is warm. There is erythema.  Nursing note and vitals reviewed.   ED Course  INCISION AND DRAINAGE Date/Time: 11/15/2014 4:48 PM Performed by: Junius Creamer Authorized by: Junius Creamer Consent: Verbal consent obtained. Written consent not obtained. Risks and benefits: risks, benefits and alternatives were discussed Consent given by: patient Patient understanding: patient states understanding of the procedure being performed Patient identity confirmed: verbally with patient Time out: Immediately prior to procedure a "time  out" was called to verify the correct patient, procedure, equipment, support staff and site/side marked as required. Type: abscess Body area: upper extremity Location details: right thumb Anesthesia: local infiltration Local anesthetic: lidocaine 1% without epinephrine Anesthetic total: 1 ml Patient sedated: no Scalpel size: 11 Incision type: single straight Complexity: simple Drainage:  purulent Drainage amount: moderate Wound treatment: wound left open   (including critical care time) Labs Review Labs Reviewed - No data to display  Imaging Review No results found.   EKG Interpretation None     Patient small abscess of his IND with moderate amount of purulent drainage.  He has been started on Keflex, abscess.  Has been present for 5 days after a foreign body splinter.  The patient tried to remove initially after the incident with a straight pin MDM   Final diagnoses:  Foreign body (FB) in soft tissue       Junius Creamer, NP 11/15/14 [place], MD 11/15/14 2004

## 2015-02-12 ENCOUNTER — Emergency Department (HOSPITAL_COMMUNITY)
Admission: EM | Admit: 2015-02-12 | Discharge: 2015-02-12 | Disposition: A | Discharge status: Home / Self Care | Payer: 59 | Attending: Emergency Medicine | Admitting: Emergency Medicine

## 2015-02-12 ENCOUNTER — Encounter (HOSPITAL_COMMUNITY): Payer: Self-pay | Admitting: *Deleted

## 2015-02-12 DIAGNOSIS — Z79899 Other long term (current) drug therapy: Secondary | ICD-10-CM | POA: Insufficient documentation

## 2015-02-12 DIAGNOSIS — M109 Gout, unspecified: Secondary | ICD-10-CM | POA: Insufficient documentation

## 2015-02-12 DIAGNOSIS — M79605 Pain in left leg: Secondary | ICD-10-CM | POA: Diagnosis present

## 2015-02-12 DIAGNOSIS — Y9389 Activity, other specified: Secondary | ICD-10-CM | POA: Insufficient documentation

## 2015-02-12 DIAGNOSIS — M7052 Other bursitis of knee, left knee: Secondary | ICD-10-CM | POA: Diagnosis not present

## 2015-02-12 DIAGNOSIS — I10 Essential (primary) hypertension: Secondary | ICD-10-CM | POA: Insufficient documentation

## 2015-02-12 DIAGNOSIS — M719 Bursopathy, unspecified: Secondary | ICD-10-CM

## 2015-02-12 DIAGNOSIS — M1990 Unspecified osteoarthritis, unspecified site: Secondary | ICD-10-CM | POA: Insufficient documentation

## 2015-02-12 MED ORDER — IBUPROFEN 600 MG PO TABS: 600.0000 mg | ORAL_TABLET | Freq: Four times a day (QID) | ORAL | Status: DC | PRN

## 2015-02-12 MED ORDER — IBUPROFEN 400 MG PO TABS
800.0000 mg | ORAL_TABLET | Freq: Once | ORAL | Status: AC
  Administered 2015-02-12: 800 mg via ORAL
  Filled 2015-02-12: qty 2

## 2015-02-12 NOTE — ED Notes (Signed)
Pain to left leg x past week. No known injury. Pain radiating from lateral side of knee down shin to ankle. Reports as sharp pain.

## 2015-02-12 NOTE — Discharge Instructions (Signed)
Bursitis Bursitis is a swelling and soreness (inflammation) of a fluid-filled sac (bursa) that overlies and protects a joint. It can be caused by injury, overuse of the joint, arthritis or infection. The joints most likely to be affected are the elbows, shoulders, hips and knees. HOME CARE INSTRUCTIONS   Apply ice to the affected area for 15-20 minutes each hour while awake for 2 days. Put the ice in a plastic bag and place a towel between the bag of ice and your skin.  Rest the injured joint as much as possible, but continue to put the joint through a full range of motion, 4 times per day. (The shoulder joint especially becomes rapidly "frozen" if not used.) When the pain lessens, begin normal slow movements and usual activities.  Only take over-the-counter or prescription medicines for pain, discomfort or fever as directed by your caregiver.  Your caregiver may recommend draining the bursa and injecting medicine into the bursa. This may help the healing process.  Follow all instructions for follow-up with your caregiver. This includes any orthopedic referrals, physical therapy and rehabilitation. Any delay in obtaining necessary care could result in a delay or failure of the bursitis to heal and chronic pain. SEEK IMMEDIATE MEDICAL CARE IF:   Your pain increases even during treatment.  You develop an oral temperature above 102 F (38.9 C) and have heat and inflammation over the involved bursa. MAKE SURE YOU:   Understand these instructions.  Will watch your condition.  Will get help right away if you are not doing well or get worse. Document Released: 08/20/2000 Document Revised: 11/15/2011 Document Reviewed: 11/12/2013 [facility] Patient Information 2015 [place], [place]. This information is not intended to replace advice given to you by your health care provider. Make sure you discuss any questions you have with your health care provider.  Please follow instructions  as stated above,  return if new worsening signs or symptoms present. Symptoms continue to persist please follow-up with your primary care provider in one week.

## 2015-02-12 NOTE — ED Provider Notes (Signed)
CSN: [id number]     Arrival date & time 02/12/15  1746 History  This chart was scribed for [name] Sink, PA-C working with No att. providers found by Mercy Moore, ED Scribe. This patient was seen in room TR09C/TR09C and the patient's care was started at 7:26 PM.   Chief Complaint  Patient presents with  . Leg Pain   The history is provided by the patient. No language interpreter was used.   HPI Comments: [name] is a 49 y.o. male with PMHx of gout, arthritis, and Hypertension who presents to the Emergency Department complaining of sharp lower left leg pain ongoing for one week. Patient locates pain at lateral knee which radiates inferiorly to mid lateral calf. Patient reports pain specially with ambulation/bearing weight stating he feels sharp shooting pain on each step. Patient reports pain in his knee with plantar and dorsiflexion of foot. Patient denies direct injury or trauma as causation. Patient reports history of gout in his feet, never in his knee; his last flare was one month ago. Patient states he takes allopurinol for his gout; he admits that he does not take it as directed, but consistently enough. Patient reports that his current knee pain his not consistent with his typical pain. Patient receives steriodal injections in his knees, but states he has not had treatment in approximately four months.  Patient denies fever, chills, warmth or redness at the site, or back pain.   Past Medical History  Diagnosis Date  . Gout   . Arthritis   . Hypertension     boarderline   Past Surgical History  Procedure Laterality Date  . Hernia repair     No family history on file. History  Substance Use Topics  . Smoking status: Never Smoker   . Smokeless tobacco: Not on file  . Alcohol Use: No    Review of Systems  All other systems reviewed and are negative.  Allergies  Review of patient's allergies indicates no known allergies.  Home Medications   Prior to Admission medications    Medication Sig Start Date End Date Taking? Authorizing Provider  ibuprofen (ADVIL,MOTRIN) 600 MG tablet Take 1 tablet (600 mg total) by mouth every 6 (six) hours as needed. 02/12/15   Okey Regal, PA-C  indomethacin (INDOCIN) 50 MG capsule Take 1 capsule (50 mg total) by mouth 3 (three) times daily with meals. Patient not taking: Reported on 11/15/2014 08/06/14   Glendell Docker, NP  lisinopril (PRINIVIL,ZESTRIL) 5 MG tablet Take 5 mg by mouth daily. 11/07/14   Historical Provider, MD  oxyCODONE-acetaminophen (PERCOCET) 7.5-325 MG per tablet Take 1 tablet by mouth every 4 (four) hours as needed. 10/18/14   Historical Provider, MD  oxyCODONE-acetaminophen (PERCOCET/ROXICET) 5-325 MG per tablet Take 1-2 tablets by mouth every 8 (eight) hours as needed for moderate pain or severe pain. Patient not taking: Reported on 11/15/2014 08/06/14   Glendell Docker, NP  traMADol (ULTRAM) 50 MG tablet Take 50 mg by mouth 2 (two) times daily. 11/07/14   Historical Provider, MD  zolpidem (AMBIEN) 10 MG tablet Take 10 mg by mouth at bedtime. 7 day prescribed on 11/07/2014 11/07/14   Historical Provider, MD   Triage Vitals: BP 158/98 mmHg  Pulse 95  Temp(Src) 98 F (36.7 C) (Oral)  Resp 18  Ht 5\' 8"  (1.727 m)  Wt 404 lb (183.253 kg)  BMI 61.44 kg/m2  SpO2 98% Physical Exam  Constitutional: He is oriented to person, place, and time. He appears well-developed and well-nourished.  No distress.  HENT:  Head: Normocephalic and atraumatic.  Eyes: EOM are normal.  Neck: Neck supple. No tracheal deviation present.  Cardiovascular: Normal rate.   Pulmonary/Chest: Effort normal. No respiratory distress.  Musculoskeletal: Normal range of motion.  Left knee tender to palpation lateral aspect at the insertion of the biceps femoris. Morbidly obese difficult exam. Full active range of motion to the left knee, equal bilateral, no numbness or tingling to the distal aspect of the leg knee. No obvious joint effusions. No signs of  infection, knee nontender to palpation. Patient able to ambulate with limited difficulty  Neurological: He is alert and oriented to person, place, and time.  Skin: Skin is warm and dry.  Psychiatric: He has a normal mood and affect. His behavior is normal.  Nursing note and vitals reviewed.   ED Course  Procedures (including critical care time)  COORDINATION OF CARE: 7:43 PM- Patient advised to follow up with PCP if his pain has improved after treatment at home. Discussed treatment plan with patient at bedside and patient agreed to plan.   Labs Review Labs Reviewed - No data to display  Imaging Review No results found.   EKG Interpretation None      MDM   Final diagnoses:  Bursitis    Labs: None indicated  Imaging: None indicated  Consults: None indicated  Therapeutics:  ibuprofen  Assessment: Bursitis  Plan: Patient presented with bursitis to the lateral aspect of his left knee. Patient is morbidly obese making physical exam difficult, but is point tender over the insertion of his biceps. No obvious signs of swelling, redness, decreased range of motion of the joint that would indicate either gout or septic arthritis at this time. No history of trauma that would indicate diagnostic imaging. Patient able to ambulate with limited difficulty or pain. Patient is instructed to use ibuprofen for the next 5 days, follow-up with [facility] her primary care provider for further evaluation if symptoms continue to persist. Instructed to return to the ED if new or worsening signs or symptoms present.    I personally performed the services described in this documentation, which was scribed in my presence. The recorded information has been reviewed and is accurate.    Okey Regal, PA-C 02/13/15 0010  Leonard Schwartz, MD 02/13/15 [phone]

## 2015-02-12 NOTE — ED Notes (Signed)
Pt A&OX4, ambulatory at d/c with steady gait, NAD 

## 2015-10-23 ENCOUNTER — Telehealth: Payer: Self-pay | Admitting: Orthopaedic Surgery

## 2015-10-23 MED ORDER — OXYCODONE HCL 10 MG PO TABS: ORAL_TABLET | ORAL | Status: DC

## 2015-10-23 NOTE — Telephone Encounter (Signed)
[facility] called and states patient just filled oxy 10/325 #150 on 1/23 and brought in oxy 10 script today... Advised to hold script until due date

## 2015-10-23 NOTE — Telephone Encounter (Signed)
Prescription available, patient aware  

## 2015-10-23 NOTE — Telephone Encounter (Signed)
Rx printed

## 2015-10-23 NOTE — Telephone Encounter (Signed)
Patient requesting refill on Percocet 10/325mg  Qty 150 Tablets

## 2015-11-24 ENCOUNTER — Telehealth: Payer: Self-pay | Admitting: Orthopaedic Surgery

## 2015-11-24 MED ORDER — OXYCODONE HCL 10 MG PO TABS: ORAL_TABLET | ORAL | Status: DC

## 2015-11-24 NOTE — Telephone Encounter (Signed)
Patient requesting refill of Oxycodone HCI Qty 150 Tablets

## 2015-11-24 NOTE — Telephone Encounter (Signed)
Rx done. 

## 2015-12-03 ENCOUNTER — Other Ambulatory Visit (HOSPITAL_COMMUNITY): Payer: Self-pay | Admitting: Family Medicine

## 2015-12-03 ENCOUNTER — Ambulatory Visit (HOSPITAL_COMMUNITY)
Admission: RE | Admit: 2015-12-03 | Discharge: 2015-12-03 | Disposition: A | Discharge status: Home / Self Care | Payer: BLUE CROSS/BLUE SHIELD | Source: Ambulatory Visit | Attending: Family Medicine | Admitting: Family Medicine

## 2015-12-03 DIAGNOSIS — R05 Cough: Secondary | ICD-10-CM

## 2015-12-03 DIAGNOSIS — R059 Cough, unspecified: Secondary | ICD-10-CM

## 2015-12-03 DIAGNOSIS — R062 Wheezing: Secondary | ICD-10-CM

## 2015-12-24 ENCOUNTER — Telehealth: Payer: Self-pay | Admitting: Orthopaedic Surgery

## 2015-12-24 MED ORDER — OXYCODONE HCL 10 MG PO TABS: ORAL_TABLET | ORAL | Status: DC

## 2015-12-24 NOTE — Telephone Encounter (Signed)
Oxycodone HCI  10mg   Qty 150 Tablets

## 2015-12-24 NOTE — Telephone Encounter (Signed)
Rx done. 

## 2015-12-31 ENCOUNTER — Ambulatory Visit: Payer: Self-pay | Admitting: Orthopaedic Surgery

## 2016-01-08 ENCOUNTER — Ambulatory Visit (INDEPENDENT_AMBULATORY_CARE_PROVIDER_SITE_OTHER): Payer: BLUE CROSS/BLUE SHIELD | Admitting: Orthopaedic Surgery

## 2016-01-08 ENCOUNTER — Encounter: Payer: Self-pay | Admitting: Orthopaedic Surgery

## 2016-01-08 VITALS — BP 143/102 | HR 98 | Temp 97.0°F | Ht 68.0 in | Wt >= 6400 oz

## 2016-01-08 DIAGNOSIS — M25562 Pain in left knee: Secondary | ICD-10-CM | POA: Diagnosis not present

## 2016-01-08 DIAGNOSIS — M25561 Pain in right knee: Secondary | ICD-10-CM

## 2016-01-08 NOTE — Progress Notes (Signed)
Patient [name]:[id number] [name], male DOB:[birth year], 50 y.o. [id number]  Chief Complaint  Patient presents with  . Follow-up    Bilateral knee pain    HPI  [name] is a 50 y.o. male who is having bilateral knee pain.  He says he has about the same pain as he had had. He has swelling and popping but no new trauma.  He has no giving way and no locking.  He is trying to be active.  He has been taking his medicine.    We had a good talk about his consideration of bariatric surgery for his massive obesity.  His BMI is over 62.  He has pain in his back and his hips.  He has contacted the surgery group in [place] and is signing up for classes.  His family support this.  He has tried all other things and he cannot get his weight down.  I told him the surgery has complications which he needs to be aware of.  But if he sincerely and honestly wants to try it and he has a good attitude and family support, he should go to the classes and then re-assess things.  He will take weight off his knees if he loses the weight.  His blood pressure is well controlled.  He may not need much medicine if he loses the weight.  His diabetes is under control and again it would improve if he loses the weight.   HPI  Body mass index is 62.96 kg/(m^2).  Review of Systems  HENT: Negative for congestion.   Respiratory: Positive for shortness of breath. Negative for cough.   Cardiovascular: Positive for leg swelling. Negative for chest pain.  Endocrine: Negative for cold intolerance.  Musculoskeletal: Positive for myalgias, back pain, joint swelling, arthralgias and gait problem.  Allergic/Immunologic: Positive for environmental allergies.  Neurological: Negative for numbness.    Past Medical History  Diagnosis Date  . Gout   . Arthritis   . Hypertension     boarderline    Past Surgical History  Procedure Laterality Date  . Hernia repair      History reviewed. No pertinent family history.  Social  History Social History  Substance Use Topics  . Smoking status: Never Smoker   . Smokeless tobacco: None  . Alcohol Use: No    No Known Allergies  Current Outpatient Prescriptions  Medication Sig Dispense Refill  . cyclobenzaprine (FLEXERIL) 10 MG tablet Take 10 mg by mouth 3 (three) times daily as needed for muscle spasms.    [name] ibuprofen (ADVIL,MOTRIN) 600 MG tablet Take 1 tablet (600 mg total) by mouth every 6 (six) hours as needed. 30 tablet 0  . indomethacin (INDOCIN) 50 MG capsule Take 1 capsule (50 mg total) by mouth 3 (three) times daily with meals. 21 capsule 0  . lisinopril (PRINIVIL,ZESTRIL) 5 MG tablet Take 5 mg by mouth daily.  0  . metFORMIN (GLUCOPHAGE) 500 MG tablet   2  . naproxen (NAPROSYN) 500 MG tablet Take 500 mg by mouth 2 (two) times daily with a meal.    . Oxycodone HCl 10 MG TABS One every four to six hours as need for pain.  Must last 30 days.  Do not drive a car or operate machinery when taking this medicine. 150 tablet 0  . zolpidem (AMBIEN) 10 MG tablet Take 10 mg by mouth at bedtime. 7 day prescribed on 11/07/2014  0   No current facility-administered medications for this visit.  Physical Exam  Blood pressure 143/102, pulse 98, temperature 97 F (36.1 C), height 5\' 8"  (1.727 m), weight 414 lb (187.789 kg).  Constitutional: overall normal hygiene, normal nutrition, well developed, normal grooming, normal body habitus. Assistive device:none  Musculoskeletal: gait and station Limp left, muscle tone and strength are normal, no tremors or atrophy is present.  .  Neurological: coordination overall normal.  Deep tendon reflex/nerve stretch intact.  Sensation normal.  Cranial nerves II-XII intact.   Skin:   normal overall no scars, lesions, ulcers or rashes. No psoriasis.  Psychiatric: Alert and oriented x 3.  Recent memory intact, remote memory unclear.  Normal mood and affect. Well groomed.  Good eye contact.  Cardiovascular: overall no swelling, no  varicosities, no edema bilaterally, normal temperatures of the legs and arms, no clubbing, cyanosis and good capillary refill.  Lymphatic: palpation is normal. Right Knee Exam   Tenderness  The patient is experiencing tenderness in the medial joint line.  Range of Motion  Extension: 0  Flexion: 100   Muscle Strength   The patient has normal right knee strength.  Tests  McMurray:  Medial - negative  Lachman:  Anterior - negative      Other  Erythema: absent Scars: absent Sensation: normal Pulse: present Swelling: mild   Left Knee Exam   Tenderness  The patient is experiencing tenderness in the medial joint line.  Range of Motion  Extension: 0  Flexion: 90   Muscle Strength   The patient has normal left knee strength.  Tests  McMurray:  Medial - negative  Lachman:  Anterior - negative      Other  Erythema: absent Scars: absent Sensation: normal Pulse: present Swelling: mild      The patient has been educated about the nature of the problem(s) and counseled on treatment options.  The patient appeared to understand what I have discussed and is in agreement with it.  Encounter Diagnoses  Name Primary?  . Right knee pain Yes  . Left knee pain   . Morbid obesity due to excess calories [facility])     PLAN Call if any problems.  Precautions discussed.  Continue current medications.   Return to clinic 3 months

## 2016-01-21 ENCOUNTER — Telehealth: Payer: Self-pay | Admitting: Orthopaedic Surgery

## 2016-01-21 MED ORDER — OXYCODONE HCL 10 MG PO TABS: ORAL_TABLET | ORAL | Status: DC

## 2016-01-21 NOTE — Telephone Encounter (Signed)
Patient called and requested a refill on Oxycodone HCI 10 mgs.  Qty 150   Oxycodone HCI 10 mgs.  Qty   150

## 2016-01-21 NOTE — Telephone Encounter (Signed)
Rx done. 

## 2016-02-18 ENCOUNTER — Telehealth: Payer: Self-pay | Admitting: Orthopaedic Surgery

## 2016-02-18 MED ORDER — OXYCODONE HCL 10 MG PO TABS: ORAL_TABLET | ORAL | Status: DC

## 2016-02-18 NOTE — Telephone Encounter (Signed)
Rx done. 

## 2016-02-18 NOTE — Telephone Encounter (Signed)
Oxycodone HCI 10 mg   Qty 150 Tablets

## 2016-03-16 ENCOUNTER — Ambulatory Visit (HOSPITAL_COMMUNITY)
Admission: RE | Admit: 2016-03-16 | Discharge: 2016-03-16 | Disposition: A | Discharge status: Home / Self Care | Payer: BLUE CROSS/BLUE SHIELD | Source: Ambulatory Visit | Attending: Orthopaedic Surgery | Admitting: Orthopaedic Surgery

## 2016-03-16 ENCOUNTER — Encounter: Payer: Self-pay | Admitting: Orthopaedic Surgery

## 2016-03-16 ENCOUNTER — Ambulatory Visit (INDEPENDENT_AMBULATORY_CARE_PROVIDER_SITE_OTHER): Payer: BLUE CROSS/BLUE SHIELD | Admitting: Orthopaedic Surgery

## 2016-03-16 VITALS — BP 160/103 | HR 92 | Temp 96.8°F | Ht 66.0 in | Wt 365.0 lb

## 2016-03-16 DIAGNOSIS — M25551 Pain in right hip: Secondary | ICD-10-CM | POA: Insufficient documentation

## 2016-03-16 NOTE — Progress Notes (Signed)
Patient AL:[id number] [name], male DOB:02-14-1966, 50 y.o. [id number]  Chief Complaint  Patient presents with  . New Problem    Right Hip pain    HPI  GEDDY PALUZZI is a 50 y.o. male who has right hip pain.  It is over the right trochanteric area and hurts when standing and rolling on it in bed.  He has no redness, no trauma, no paresthesias.  He has used ice and heat and pain medicine with only slight help.  HPI  Body mass index is 58.94 kg/(m^2).  ROS  Review of Systems  HENT: Negative for congestion.   Respiratory: Positive for shortness of breath. Negative for cough.   Cardiovascular: Positive for leg swelling. Negative for chest pain.  Endocrine: Negative for cold intolerance.  Musculoskeletal: Positive for myalgias, back pain, joint swelling, arthralgias and gait problem.  Allergic/Immunologic: Positive for environmental allergies.  Neurological: Negative for numbness.    Past Medical History  Diagnosis Date  . Gout   . Arthritis   . Hypertension     boarderline    Past Surgical History  Procedure Laterality Date  . Hernia repair      No family history on file.  Social History Social History  Substance Use Topics  . Smoking status: Never Smoker   . Smokeless tobacco: Not on file  . Alcohol Use: No    No Known Allergies  Current Outpatient Prescriptions  Medication Sig Dispense Refill  . cyclobenzaprine (FLEXERIL) 10 MG tablet Take 10 mg by mouth 3 (three) times daily as needed for muscle spasms.    [name] lisinopril (PRINIVIL,ZESTRIL) 5 MG tablet Take 5 mg by mouth daily.  0  . metFORMIN (GLUCOPHAGE) 500 MG tablet   2  . naproxen (NAPROSYN) 500 MG tablet Take 500 mg by mouth 2 (two) times daily with a meal.    . Oxycodone HCl 10 MG TABS One every four to six hours as need for pain.  Must last 30 days.  Do not drive a car or operate machinery when taking this medicine. 150 tablet 0   No current facility-administered medications for this visit.     Physical  Exam  Blood pressure 160/103, pulse 92, temperature 96.8 F (36 C), height 5\' 6"  (1.676 m), weight 365 lb (165.563 kg).  Constitutional: overall normal hygiene, normal nutrition, well developed, normal grooming, normal body habitus. Assistive device:none  Musculoskeletal: gait and station Limp right, muscle tone and strength are normal, no tremors or atrophy is present.  .  Neurological: coordination overall normal.  Deep tendon reflex/nerve stretch intact.  Sensation normal.  Cranial nerves II-XII intact.   Skin:   normal overall no scars, lesions, ulcers or rashes. No psoriasis.  Psychiatric: Alert and oriented x 3.  Recent memory intact, remote memory unclear.  Normal mood and affect. Well groomed.  Good eye contact.  Cardiovascular: overall no swelling, no varicosities, no edema bilaterally, normal temperatures of the legs and arms, no clubbing, cyanosis and good capillary refill.  Lymphatic: palpation is normal.  The right hip is tender over the lateral trochanteric bursa area.  He has no redness.  NV intact.  ROM of the hip is full.  Left hip negative.  The patient has been educated about the nature of the problem(s) and counseled on treatment options.  The patient appeared to understand what I have discussed and is in agreement with it.  Encounter Diagnosis  Name Primary?  . Right hip pain Yes   PROCEDURE NOTE:  The  patient request injection, verbal consent was obtained.  The right trochanteric area of the hip was prepped appropriately after time out was performed.   Sterile technique was observed and injection of 1 cc of Depo-Medrol 40 mg with several cc's of plain xylocaine. Anesthesia was provided by ethyl chloride and a 20-gauge needle was used to inject the hip area. The injection was tolerated well.  A band aid dressing was applied.  The patient was advised to apply ice later today and tomorrow to the injection sight as needed.  PLAN Call if any problems.   Precautions discussed.  Continue current medications.   Return to clinic tomorrow.  I will get x-rays from the hospital.  His body habitus limits x-rays here in the office with the portable machine.  He understands.   Electronically Signed Sanjuana Kava, MD 7/11/20172:49 PM

## 2016-03-17 ENCOUNTER — Encounter: Payer: Self-pay | Admitting: Orthopaedic Surgery

## 2016-03-17 ENCOUNTER — Ambulatory Visit (INDEPENDENT_AMBULATORY_CARE_PROVIDER_SITE_OTHER): Payer: BLUE CROSS/BLUE SHIELD | Admitting: Orthopaedic Surgery

## 2016-03-17 VITALS — BP 164/85 | HR 87 | Temp 96.8°F | Ht 66.0 in | Wt 365.0 lb

## 2016-03-17 DIAGNOSIS — M25551 Pain in right hip: Secondary | ICD-10-CM

## 2016-03-17 MED ORDER — OXYCODONE HCL 10 MG PO TABS: ORAL_TABLET | ORAL | Status: DC

## 2016-03-17 NOTE — Progress Notes (Signed)
Patient [name], male DOB:November 19, 1965, 50 y.o. [id number]  Chief Complaint  Patient presents with  . Follow-up    right hip pain    HPI  [name] is a 50 y.o. male who has right hip trochanteric bursitis.  He had injection yesterday and is better today. I sent him to the hospital for x-rays of the right hip which are negative.  I have informed him of the findings.  He is to use ice to the hip and I will see him in one week.  HPI  Body mass index is 58.94 kg/(m^2).  ROS  Review of Systems  HENT: Negative for congestion.   Respiratory: Positive for shortness of breath. Negative for cough.   Cardiovascular: Positive for leg swelling. Negative for chest pain.  Endocrine: Negative for cold intolerance.  Musculoskeletal: Positive for myalgias, back pain, joint swelling, arthralgias and gait problem.  Allergic/Immunologic: Positive for environmental allergies.  Neurological: Negative for numbness.    Past Medical History  Diagnosis Date  . Gout   . Arthritis   . Hypertension     boarderline    Past Surgical History  Procedure Laterality Date  . Hernia repair      History reviewed. No pertinent family history.  Social History Social History  Substance Use Topics  . Smoking status: Never Smoker   . Smokeless tobacco: None  . Alcohol Use: No    No Known Allergies  Current Outpatient Prescriptions  Medication Sig Dispense Refill  . cyclobenzaprine (FLEXERIL) 10 MG tablet Take 10 mg by mouth 3 (three) times daily as needed for muscle spasms.    [name] lisinopril (PRINIVIL,ZESTRIL) 5 MG tablet Take 5 mg by mouth daily.  0  . metFORMIN (GLUCOPHAGE) 500 MG tablet   2  . naproxen (NAPROSYN) 500 MG tablet Take 500 mg by mouth 2 (two) times daily with a meal.    . Oxycodone HCl 10 MG TABS One every four to six hours as need for pain.  Must last 30 days.  Do not drive a car or operate machinery when taking this medicine. 150 tablet 0   No current facility-administered  medications for this visit.     Physical Exam  Blood pressure 164/85, pulse 87, temperature 96.8 F (36 C), height 5\' 6"  (1.676 m), weight 365 lb (165.563 kg).  Constitutional: overall normal hygiene, normal nutrition, well developed, normal grooming, normal body habitus. Assistive device:none  Musculoskeletal: gait and station Limp right, muscle tone and strength are normal, no tremors or atrophy is present.  .  Neurological: coordination overall normal.  Deep tendon reflex/nerve stretch intact.  Sensation normal.  Cranial nerves II-XII intact.   Skin:   normal overall no scars, lesions, ulcers or rashes. No psoriasis.  Psychiatric: Alert and oriented x 3.  Recent memory intact, remote memory unclear.  Normal mood and affect. Well groomed.  Good eye contact.  Cardiovascular: overall no swelling, no varicosities, no edema bilaterally, normal temperatures of the legs and arms, no clubbing, cyanosis and good capillary refill.  Lymphatic: palpation is normal.  His right hip trochanteric area laterally is tender but not as much as yesterday.  ROM of the right hip is full. Left hip negative.  The patient has been educated about the nature of the problem(s) and counseled on treatment options.  The patient appeared to understand what I have discussed and is in agreement with it.  Encounter Diagnoses  Name Primary?  . Right hip pain Yes  . Morbid obesity  due to excess calories [facility])     PLAN Call if any problems.  Precautions discussed.  Continue current medications.   Return to clinic 1 week   Electronically Signed Sanjuana Kava, MD 7/12/201710:58 AM

## 2016-03-24 ENCOUNTER — Ambulatory Visit: Payer: BLUE CROSS/BLUE SHIELD | Admitting: Orthopaedic Surgery

## 2016-03-28 ENCOUNTER — Encounter (HOSPITAL_COMMUNITY): Payer: Self-pay | Admitting: *Deleted

## 2016-03-28 ENCOUNTER — Emergency Department (HOSPITAL_COMMUNITY)
Admission: EM | Admit: 2016-03-28 | Discharge: 2016-03-29 | Disposition: A | Discharge status: Home / Self Care | Payer: BLUE CROSS/BLUE SHIELD | Attending: Emergency Medicine | Admitting: Emergency Medicine

## 2016-03-28 DIAGNOSIS — Z791 Long term (current) use of non-steroidal anti-inflammatories (NSAID): Secondary | ICD-10-CM | POA: Diagnosis not present

## 2016-03-28 DIAGNOSIS — R42 Dizziness and giddiness: Secondary | ICD-10-CM | POA: Diagnosis not present

## 2016-03-28 DIAGNOSIS — Z7984 Long term (current) use of oral hypoglycemic drugs: Secondary | ICD-10-CM | POA: Diagnosis not present

## 2016-03-28 DIAGNOSIS — M1990 Unspecified osteoarthritis, unspecified site: Secondary | ICD-10-CM | POA: Insufficient documentation

## 2016-03-28 DIAGNOSIS — I10 Essential (primary) hypertension: Secondary | ICD-10-CM | POA: Insufficient documentation

## 2016-03-28 DIAGNOSIS — E1165 Type 2 diabetes mellitus with hyperglycemia: Secondary | ICD-10-CM | POA: Diagnosis not present

## 2016-03-28 DIAGNOSIS — Z79899 Other long term (current) drug therapy: Secondary | ICD-10-CM | POA: Diagnosis not present

## 2016-03-28 DIAGNOSIS — R739 Hyperglycemia, unspecified: Secondary | ICD-10-CM

## 2016-03-28 DIAGNOSIS — R197 Diarrhea, unspecified: Secondary | ICD-10-CM | POA: Insufficient documentation

## 2016-03-28 HISTORY — DX: Type 2 diabetes mellitus without complications: E11.9

## 2016-03-28 NOTE — ED Triage Notes (Signed)
Pt c/o dizziness, blurred vision, high blood sugar, reports that symptoms started two days ago and his cbg machine was reading high yesterday,

## 2016-03-29 LAB — COMPREHENSIVE METABOLIC PANEL
ALBUMIN: 3.1 g/dL — AB (ref 3.5–5.0)
ALT: 22 U/L (ref 17–63)
ANION GAP: 5 (ref 5–15)
AST: 16 U/L (ref 15–41)
Alkaline Phosphatase: 113 U/L (ref 38–126)
BILIRUBIN TOTAL: 0.4 mg/dL (ref 0.3–1.2)
BUN: 7 mg/dL (ref 6–20)
CHLORIDE: 103 mmol/L (ref 101–111)
CO2: 24 mmol/L (ref 22–32)
Calcium: 8.3 mg/dL — ABNORMAL LOW (ref 8.9–10.3)
Creatinine, Ser: 0.66 mg/dL (ref 0.61–1.24)
GFR calc Af Amer: >60 mL/min (ref >60)
GLUCOSE: 277 mg/dL — AB (ref 65–99)
POTASSIUM: 3.5 mmol/L (ref 3.5–5.1)
Sodium: 132 mmol/L — ABNORMAL LOW (ref 135–145)
TOTAL PROTEIN: 7 g/dL (ref 6.5–8.1)

## 2016-03-29 LAB — CBC WITH DIFFERENTIAL/PLATELET
BASOS ABS: 0 10*3/uL (ref 0.0–0.1)
BASOS PCT: 0 %
EOS PCT: 1 %
Eosinophils Absolute: 0.1 10*3/uL (ref 0.0–0.7)
HCT: 43.1 % (ref 39.0–52.0)
Hemoglobin: 13.9 g/dL (ref 13.0–17.0)
Lymphocytes Relative: 24 %
Lymphs Abs: 3.2 10*3/uL (ref 0.7–4.0)
MCH: 26.8 pg (ref 26.0–34.0)
MCHC: 32.3 g/dL (ref 30.0–36.0)
MCV: 83.2 fL (ref 78.0–100.0)
MONO ABS: 1.2 10*3/uL — AB (ref 0.1–1.0)
MONOS PCT: 9 %
Neutro Abs: 8.8 10*3/uL — ABNORMAL HIGH (ref 1.7–7.7)
Neutrophils Relative %: 66 %
PLATELETS: 270 10*3/uL (ref 150–400)
RBC: 5.18 MIL/uL (ref 4.22–5.81)
RDW: 14.3 % (ref 11.5–15.5)
WBC: 13.4 10*3/uL — ABNORMAL HIGH (ref 4.0–10.5)

## 2016-03-29 LAB — CBG MONITORING, ED
GLUCOSE-CAPILLARY: 273 mg/dL — AB (ref 65–99)
Glucose-Capillary: 219 mg/dL — ABNORMAL HIGH (ref 65–99)

## 2016-03-29 LAB — URINALYSIS, ROUTINE W REFLEX MICROSCOPIC
Glucose, UA: >1000 mg/dL — AB
LEUKOCYTES UA: NEGATIVE
Nitrite: NEGATIVE
SPECIFIC GRAVITY, URINE: 1.025 (ref 1.005–1.030)
pH: 5.5 (ref 5.0–8.0)

## 2016-03-29 LAB — URINE MICROSCOPIC-ADD ON

## 2016-03-29 MED ORDER — SODIUM CHLORIDE 0.9 % IV BOLUS (SEPSIS)
1000.0000 mL | Freq: Once | INTRAVENOUS | Status: AC
  Administered 2016-03-29: 1000 mL via INTRAVENOUS

## 2016-03-29 NOTE — ED Provider Notes (Signed)
TIME SEEN: 12:20 AM 03/29/2016  CHIEF COMPLAINT:  Chief Complaint  Patient presents with  . Dizziness    HPI:  HPI Comments: [name] is a 50 y.o. male with h/o DM and HTN who presents to the Emergency Department complaining of sudden onset, constant lightheadedness onset two days ago. Pt also has associated blurred vision, diarrhea, and diaphoresis.  He also reports high blood sugar and notes that his CBG machine was reading "high" Intermittently over the past 3 days. Pt has took Metformin PTA with mild relief. He is not on insulin. States he took a dose of metformin at 9 PM. Does report he has had polydipsia, urinary frequency. No dysuria. Pt denies fever, cough, vomiting, nausea, hematochezia, numbness, weakness, abdominal pain, chest pain and shortness of breath.   ROS: See HPI Constitutional: no fever  Eyes: no drainage  ENT: no runny nose   Cardiovascular:  no chest pain  Resp: no SOB  GI: no vomiting GU: no dysuria Integumentary: no rash  Allergy: no hives  Musculoskeletal: no leg swelling  Neurological: no slurred speech ROS otherwise negative  PAST MEDICAL HISTORY/PAST SURGICAL HISTORY:  Past Medical History:  Diagnosis Date  . Arthritis   . Diabetes mellitus without complication ([place])   . Gout   . Hypertension    boarderline    MEDICATIONS:  Prior to Admission medications   Medication Sig Start Date End Date Taking? Authorizing Provider  cyclobenzaprine (FLEXERIL) 10 MG tablet Take 10 mg by mouth 3 (three) times daily as needed for muscle spasms.   Yes Historical Provider, MD  lisinopril (PRINIVIL,ZESTRIL) 5 MG tablet Take 5 mg by mouth daily. 11/07/14  Yes Historical Provider, MD  metFORMIN (GLUCOPHAGE) 500 MG tablet 500 mg 2 (two) times daily with a meal.  01/05/16  Yes Historical Provider, MD  naproxen (NAPROSYN) 500 MG tablet Take 500 mg by mouth 2 (two) times daily with a meal.   Yes Historical Provider, MD  Oxycodone HCl 10 MG TABS One every four to six hours  as need for pain.  Must last 30 days.  Do not drive a car or operate machinery when taking this medicine. 03/17/16  Yes Sanjuana Kava, MD    ALLERGIES:  No Known Allergies  SOCIAL HISTORY:  Social History  Substance Use Topics  . Smoking status: Never Smoker  . Smokeless tobacco: Never Used  . Alcohol use No    FAMILY HISTORY: No family history on file.  EXAM: BP (!) 145/101   Pulse 85   Temp 97.6 F (36.4 C)   Resp 20   Ht 5\' 8"  (1.727 m)   Wt (!) 360 lb (163.3 kg)   SpO2 98%   BMI 54.74 kg/m  CONSTITUTIONAL: Alert and oriented and responds appropriately to questions. Well-appearing; well-nourished. Morbid obesity. HEAD: Normocephalic EYES: Conjunctivae clear, PERRL ENT: normal nose; no rhinorrhea; moist mucous membranes NECK: Supple, no meningismus, no LAD  CARD: RRR; S1 and S2 appreciated; no murmurs, no clicks, no rubs, no gallops RESP: Normal chest excursion without splinting or tachypnea; breath sounds clear and equal bilaterally; no wheezes, no rhonchi, no rales, no hypoxia or respiratory distress, speaking full sentences ABD/GI: Normal bowel sounds; non-distended; soft, non-tender, no rebound, no guarding, no peritoneal signs BACK:  The back appears normal and is non-tender to palpation, there is no CVA tenderness EXT: Normal ROM in all joints; non-tender to palpation; no edema; normal capillary refill; no cyanosis, no calf tenderness or swelling    SKIN: Normal color  for age and race; warm; no rash NEURO: Moves all extremities equally, sensation to light touch intact diffusely, cranial nerves II through XII intact PSYCH: The patient's mood and manner are appropriate. Grooming and personal hygiene are appropriate.  MEDICAL DECISION MAKING: Patient here with blurred vision, lightheadedness, hyperglycemia. Symptoms improving since taking his metformin. Does report 2-3 days of diarrhea but no abdominal pain or vomiting. Denies to me any chest pain or shortness of breath.  No neurologic deficits. Reports he's had similar symptoms with hyperglycemia in the past. Will check labs, urine. EKG shows no ischemic abnormality, arrhythmia, interval changes. We'll check orthostatic vital signs and give IV fluids.  ED PROGRESS: Patient's labs are unremarkable other than hyperglycemia without DKA. Urine shows no sign of infection. Orthostatic vital signs normal.   Patient reports feeling better after IV fluids. No further episodes of diarrhea while in the emergency department. Blood sugar has improved. I recommend close follow-up with his PCP and foramina regular check his blood sugars that he can keep a log of this so his PCP can help decide if he needs to be on anything other than metformin. Have recommended diet changes. He is comfortable with this plan.   At this time, I do not feel there is any life-threatening condition present. I have reviewed and discussed all results (EKG, imaging, lab, urine as appropriate), exam findings with patient. I have reviewed nursing notes and appropriate previous records.  I feel the patient is safe to be discharged home without further emergent workup. Discussed usual and customary return precautions. Patient and family (if present) verbalize understanding and are comfortable with this plan.  Patient will follow-up with their primary care provider. If they do not have a primary care provider, information for follow-up has been provided to them. All questions have been answered.     EKG Interpretation  Date/Time:  Monday March 29 2016 00:02:30 EDT Ventricular Rate:  85 PR Interval:    QRS Duration: 93 QT Interval:  352 QTC Calculation: 419 R Axis:   76 Text Interpretation:  Sinus rhythm Prominent P waves, nondiagnostic Borderline repolarization abnormality Borderline ST elevation, anterior leads No significant change since last tracing since 2008 Confirmed by Tylesha Gibeault,  DO, Lexiana Spindel [phone]) on 03/29/2016 12:14:25 AM        I personally  performed the services described in this documentation, which was scribed in my presence. The recorded information has been reviewed and is accurate.    [place], DO 03/29/16 [phone]

## 2016-03-29 NOTE — Discharge Instructions (Signed)
You may use over-the-counter Imodium for diarrhea. Please return the emergency department if you began having blood in your stool, black stools, abdominal pain, fever.   I recommend that you obtain a new glucometer and check your blood sugar regularly and keep a log of this for your primary care physician so they can decide if you need to be on a new medication or if you need to have your metformin increase. Please avoid fatty meals, foods with a large amount of sugar, sodas.

## 2016-04-13 ENCOUNTER — Encounter: Payer: Self-pay | Admitting: Orthopaedic Surgery

## 2016-04-13 ENCOUNTER — Ambulatory Visit (INDEPENDENT_AMBULATORY_CARE_PROVIDER_SITE_OTHER): Payer: BLUE CROSS/BLUE SHIELD | Admitting: Orthopaedic Surgery

## 2016-04-13 DIAGNOSIS — M25562 Pain in left knee: Secondary | ICD-10-CM | POA: Diagnosis not present

## 2016-04-13 DIAGNOSIS — M25561 Pain in right knee: Secondary | ICD-10-CM

## 2016-04-13 MED ORDER — OXYCODONE HCL 10 MG PO TABS: ORAL_TABLET | ORAL | 0 refills | Status: DC

## 2016-04-13 NOTE — Progress Notes (Signed)
Patient [name], male DOB:06-30-1966, 50 y.o. [name]  Chief Complaint  Patient presents with  . Follow-up    Bilateral knee pain    HPI  [name] is a 50 y.o. male who has chronic pain in both knees.  He has no locking or giving way.  He has no trauma.  He has swelling and popping.  He is severely obese. HPI  Body mass index is 61.18 kg/m.  ROS  Review of Systems  HENT: Negative for congestion.   Respiratory: Positive for shortness of breath. Negative for cough.   Cardiovascular: Positive for leg swelling. Negative for chest pain.  Endocrine: Negative for cold intolerance.  Musculoskeletal: Positive for arthralgias, back pain, gait problem, joint swelling and myalgias.  Allergic/Immunologic: Positive for environmental allergies.  Neurological: Negative for numbness.    Past Medical History:  Diagnosis Date  . Arthritis   . Diabetes mellitus without complication ([place])   . Gout   . Hypertension    boarderline    Past Surgical History:  Procedure Laterality Date  . HERNIA REPAIR      History reviewed. No pertinent family history.  Social History Social History  Substance Use Topics  . Smoking status: Never Smoker  . Smokeless tobacco: Never Used  . Alcohol use No    No Known Allergies  Current Outpatient Prescriptions  Medication Sig Dispense Refill  . cyclobenzaprine (FLEXERIL) 10 MG tablet Take 10 mg by mouth 3 (three) times daily as needed for muscle spasms.    [name] lisinopril (PRINIVIL,ZESTRIL) 5 MG tablet Take 5 mg by mouth daily.  0  . metFORMIN (GLUCOPHAGE) 500 MG tablet 500 mg 2 (two) times daily with a meal.   2  . naproxen (NAPROSYN) 500 MG tablet Take 500 mg by mouth 2 (two) times daily with a meal.    . Oxycodone HCl 10 MG TABS One every four to six hours as need for pain.  Must last 30 days.  Do not drive a car or operate machinery when taking this medicine. 150 tablet 0   No current facility-administered medications for this visit.       Physical Exam  Blood pressure (!) 163/109, pulse (!) 121, temperature 97.3 F (36.3 C), height 5\' 8"  (1.727 m), weight (!) 402 lb 6.4 oz (182.5 kg).  Constitutional: overall normal hygiene, normal nutrition, well developed, normal grooming, normal body habitus. Assistive device:none  Musculoskeletal: gait and station Limp right, muscle tone and strength are normal, no tremors or atrophy is present.  .  Neurological: coordination overall normal.  Deep tendon reflex/nerve stretch intact.  Sensation normal.  Cranial nerves II-XII intact.   Skin:   normal overall no scars, lesions, ulcers or rashes. No psoriasis.  Psychiatric: Alert and oriented x 3.  Recent memory intact, remote memory unclear.  Normal mood and affect. Well groomed.  Good eye contact.  Cardiovascular: overall no swelling, no varicosities, no edema bilaterally, normal temperatures of the legs and arms, no clubbing, cyanosis and good capillary refill.  Lymphatic: palpation is normal.  The bilateral lower extremity is examined:  Inspection:  Thigh:  Non-tender and no defects  Knee has swelling 1+ effusion.                        Joint tenderness is present                        Patient is tender over the medial  joint line  Lower Leg:  Has normal appearance and no tenderness or defects  Ankle:  Non-tender and no defects  Foot:  Non-tender and no defects Range of Motion:  Knee:  Range of motion is: 0-95 right, 0 to 100 left                        Crepitus is  present  Ankle:  Range of motion is normal. Strength and Tone:  The bilateral lower extremity has normal strength and tone. Stability:  Knee:  The knee is stable.  Ankle:  The ankle is stable.    The patient has been educated about the nature of the problem(s) and counseled on treatment options.  The patient appeared to understand what I have discussed and is in agreement with it.  Encounter Diagnoses  Name Primary?  . Morbid obesity due to excess  calories ([place]) Yes  . Right knee pain   . Left knee pain     PLAN Call if any problems.  Precautions discussed.  Continue current medications.   Return to clinic 1 month   Electronically Signed Sanjuana Kava, MD 8/8/20172:47 PM

## 2016-04-23 ENCOUNTER — Ambulatory Visit (INDEPENDENT_AMBULATORY_CARE_PROVIDER_SITE_OTHER): Payer: BLUE CROSS/BLUE SHIELD | Admitting: Urology

## 2016-04-23 DIAGNOSIS — R350 Frequency of micturition: Secondary | ICD-10-CM

## 2016-04-23 DIAGNOSIS — N476 Balanoposthitis: Secondary | ICD-10-CM | POA: Diagnosis not present

## 2016-04-23 DIAGNOSIS — N3941 Urge incontinence: Secondary | ICD-10-CM

## 2016-05-11 ENCOUNTER — Encounter: Payer: Self-pay | Admitting: Orthopaedic Surgery

## 2016-05-11 ENCOUNTER — Ambulatory Visit (INDEPENDENT_AMBULATORY_CARE_PROVIDER_SITE_OTHER): Payer: BLUE CROSS/BLUE SHIELD | Admitting: Orthopaedic Surgery

## 2016-05-11 DIAGNOSIS — M25562 Pain in left knee: Secondary | ICD-10-CM

## 2016-05-11 DIAGNOSIS — M25561 Pain in right knee: Secondary | ICD-10-CM

## 2016-05-11 MED ORDER — OXYCODONE HCL 10 MG PO TABS: ORAL_TABLET | ORAL | 0 refills | Status: DC

## 2016-05-11 NOTE — Progress Notes (Signed)
Patient AL:[id number] [name], male DOB:[birth year], 50 y.o. [id number]  Chief Complaint  Patient presents with  . Follow-up    Bilateral knee pain    HPI  [name] is a 50 y.o. male who has chronic pain of the knees bilaterally.  He has more pain on the right knee.  He has no giving way, no locking.  He has swelling and popping.  He is markedly overweight.  I have talked to him about losing weight gradually.  He says he has an appointment with his family doctor soon for possible gastric bypass evaluation. HPI  Body mass index is 58.54 kg/m.  ROS  Review of Systems  HENT: Negative for congestion.   Respiratory: Positive for shortness of breath. Negative for cough.   Cardiovascular: Positive for leg swelling. Negative for chest pain.  Endocrine: Negative for cold intolerance.  Musculoskeletal: Positive for arthralgias, back pain, gait problem, joint swelling and myalgias.  Allergic/Immunologic: Positive for environmental allergies.  Neurological: Negative for numbness.    Past Medical History:  Diagnosis Date  . Arthritis   . Diabetes mellitus without complication ([place])   . Gout   . Hypertension    boarderline    Past Surgical History:  Procedure Laterality Date  . HERNIA REPAIR      History reviewed. No pertinent family history.  Social History Social History  Substance Use Topics  . Smoking status: Never Smoker  . Smokeless tobacco: Never Used  . Alcohol use No    No Known Allergies  Current Outpatient Prescriptions  Medication Sig Dispense Refill  . cyclobenzaprine (FLEXERIL) 10 MG tablet Take 10 mg by mouth 3 (three) times daily as needed for muscle spasms.    [name] lisinopril (PRINIVIL,ZESTRIL) 5 MG tablet Take 5 mg by mouth daily.  0  . metFORMIN (GLUCOPHAGE) 500 MG tablet 500 mg 2 (two) times daily with a meal.   2  . naproxen (NAPROSYN) 500 MG tablet Take 500 mg by mouth 2 (two) times daily with a meal.    . Oxycodone HCl 10 MG TABS One every four to six  hours as need for pain.  Must last 30 days.  Do not drive a car or operate machinery when taking this medicine. 150 tablet 0   No current facility-administered medications for this visit.      Physical Exam  Blood pressure (!) 178/113, pulse 82, height 5\' 8"  (1.727 m), weight (!) 385 lb (174.6 kg).  Constitutional: overall normal hygiene, normal nutrition, well developed, normal grooming, normal body habitus. Assistive device:none  Musculoskeletal: gait and station Limp right, muscle tone and strength are normal, no tremors or atrophy is present.  .  Neurological: coordination overall normal.  Deep tendon reflex/nerve stretch intact.  Sensation normal.  Cranial nerves II-XII intact.   Skin:   normal overall no scars, lesions, ulcers or rashes. No psoriasis.  Psychiatric: Alert and oriented x 3.  Recent memory intact, remote memory unclear.  Normal mood and affect. Well groomed.  Good eye contact.  Cardiovascular: overall no swelling, no varicosities, no edema bilaterally, normal temperatures of the legs and arms, no clubbing, cyanosis and good capillary refill.  Lymphatic: palpation is normal.  The right lower extremity is examined:  Inspection:  Thigh:  Non-tender and no defects  Knee has swelling 1+ effusion.                        Joint tenderness is present  Patient is tender over the medial joint line  Lower Leg:  Has normal appearance and no tenderness or defects  Ankle:  Non-tender and no defects  Foot:  Non-tender and no defects Range of Motion:  Knee:  Range of motion is: 0-100                        Crepitus is  present  Ankle:  Range of motion is normal. Strength and Tone:  The right lower extremity has normal strength and tone. Stability:  Knee:  The knee is stable.  Ankle:  The ankle is stable.    The patient has been educated about the nature of the problem(s) and counseled on treatment options.  The patient appeared to understand  what I have discussed and is in agreement with it.  Encounter Diagnoses  Name Primary?  . Morbid obesity due to excess calories ([place]) Yes  . Right knee pain   . Left knee pain     PLAN Call if any problems.  Precautions discussed.  Continue current medications.   Return to clinic 1 month   Electronically Signed Sanjuana Kava, MD 9/5/20173:36 PM

## 2016-05-28 ENCOUNTER — Ambulatory Visit (INDEPENDENT_AMBULATORY_CARE_PROVIDER_SITE_OTHER): Payer: BLUE CROSS/BLUE SHIELD | Admitting: Urology

## 2016-05-28 DIAGNOSIS — N5201 Erectile dysfunction due to arterial insufficiency: Secondary | ICD-10-CM | POA: Diagnosis not present

## 2016-05-28 DIAGNOSIS — R350 Frequency of micturition: Secondary | ICD-10-CM

## 2016-05-28 DIAGNOSIS — N481 Balanitis: Secondary | ICD-10-CM | POA: Diagnosis not present

## 2016-06-02 ENCOUNTER — Ambulatory Visit (INDEPENDENT_AMBULATORY_CARE_PROVIDER_SITE_OTHER): Payer: BLUE CROSS/BLUE SHIELD | Admitting: Orthopaedic Surgery

## 2016-06-02 ENCOUNTER — Encounter: Payer: Self-pay | Admitting: Orthopaedic Surgery

## 2016-06-02 DIAGNOSIS — M25562 Pain in left knee: Secondary | ICD-10-CM

## 2016-06-02 DIAGNOSIS — M25561 Pain in right knee: Secondary | ICD-10-CM | POA: Diagnosis not present

## 2016-06-02 MED ORDER — OXYCODONE HCL 10 MG PO TABS: ORAL_TABLET | ORAL | 0 refills | Status: DC

## 2016-06-02 NOTE — Progress Notes (Signed)
Patient [name]:[id number] QUINTARIUS DRYSDALE, male DOB:12-May-1966, 50 y.o. [id number]  Chief Complaint  Patient presents with  . Follow-up    bilateral knee pain    HPI  [name] is a 50 y.o. male who has bilateral knee pain.  He has no new trauma.  He has popping and swelling but no giving way or locking. He is very morbidly obese.  He declines to get on scale today. HPI  There is no height or weight on file to calculate BMI.  ROS  Review of Systems  Past Medical History:  Diagnosis Date  . Arthritis   . Diabetes mellitus without complication ([place])   . Gout   . Hypertension    boarderline    Past Surgical History:  Procedure Laterality Date  . HERNIA REPAIR      No family history on file.  Social History Social History  Substance Use Topics  . Smoking status: Never Smoker  . Smokeless tobacco: Never Used  . Alcohol use No    No Known Allergies  Current Outpatient Prescriptions  Medication Sig Dispense Refill  . cyclobenzaprine (FLEXERIL) 10 MG tablet Take 10 mg by mouth 3 (three) times daily as needed for muscle spasms.    [name] lisinopril (PRINIVIL,ZESTRIL) 5 MG tablet Take 5 mg by mouth daily.  0  . metFORMIN (GLUCOPHAGE) 500 MG tablet 500 mg 2 (two) times daily with a meal.   2  . naproxen (NAPROSYN) 500 MG tablet Take 500 mg by mouth 2 (two) times daily with a meal.    . Oxycodone HCl 10 MG TABS One every four to six hours as need for pain.  Must last 30 days.  Do not drive a car or operate machinery when taking this medicine. 150 tablet 0   No current facility-administered medications for this visit.      Physical Exam  Blood pressure 126/77, pulse 86, temperature (!) 96.8 F (36 C), height 5\' 8"  (1.727 m).  Constitutional: overall normal hygiene, normal nutrition, well developed, normal grooming, normal body habitus. Assistive device:none  Musculoskeletal: gait and station Limp more to the right with a waddling type gait, muscle tone and strength are normal, no  tremors or atrophy is present.  .  Neurological: coordination overall normal.  Deep tendon reflex/nerve stretch intact.  Sensation normal.  Cranial nerves II-XII intact.   Skin:   Normal overall no scars, lesions, ulcers or rashes. No psoriasis.  Psychiatric: Alert and oriented x 3.  Recent memory intact, remote memory unclear.  Normal mood and affect. Well groomed.  Good eye contact.  Cardiovascular: overall no swelling, no varicosities, no edema bilaterally, normal temperatures of the legs and arms, no clubbing, cyanosis and good capillary refill.  Lymphatic: palpation is normal.  The bilateral lower extremity is examined:  Inspection:  Thigh:  Non-tender and no defects  Knee has swelling 1+ effusion.                        Joint tenderness is present                        Patient is tender over the medial joint line  Lower Leg:  Has normal appearance and no tenderness or defects  Ankle:  Non-tender and no defects  Foot:  Non-tender and no defects Range of Motion:  Knee:  Range of motion is: 0-100 right, 0 to 105 left  Crepitus is  present  Ankle:  Range of motion is normal. Strength and Tone:  The bilateral lower extremity has normal strength and tone. Stability:  Knee:  The knee is stable.  Ankle:  The ankle is stable.    The patient has been educated about the nature of the problem(s) and counseled on treatment options.  The patient appeared to understand what I have discussed and is in agreement with it.  Encounter Diagnoses  Name Primary?  . Right knee pain   . Left knee pain   . Morbid obesity due to excess calories ([place]) Yes    PLAN Call if any problems.  Precautions discussed.  Continue current medications.   Return to clinic 1 month   Electronically [place], MD 9/27/20172:38 PM

## 2016-06-03 ENCOUNTER — Ambulatory Visit: Payer: BLUE CROSS/BLUE SHIELD | Admitting: Orthopaedic Surgery

## 2016-06-09 ENCOUNTER — Ambulatory Visit: Payer: BLUE CROSS/BLUE SHIELD | Admitting: Orthopaedic Surgery

## 2016-06-30 ENCOUNTER — Encounter: Payer: Self-pay | Admitting: Orthopaedic Surgery

## 2016-06-30 ENCOUNTER — Ambulatory Visit (INDEPENDENT_AMBULATORY_CARE_PROVIDER_SITE_OTHER): Payer: BLUE CROSS/BLUE SHIELD | Admitting: Orthopaedic Surgery

## 2016-06-30 VITALS — BP 148/98 | HR 87 | Temp 97.5°F | Ht 68.0 in

## 2016-06-30 DIAGNOSIS — G8929 Other chronic pain: Secondary | ICD-10-CM

## 2016-06-30 DIAGNOSIS — M25562 Pain in left knee: Secondary | ICD-10-CM

## 2016-06-30 DIAGNOSIS — M25561 Pain in right knee: Secondary | ICD-10-CM | POA: Diagnosis not present

## 2016-06-30 MED ORDER — OXYCODONE HCL 10 MG PO TABS: ORAL_TABLET | ORAL | 0 refills | Status: DC

## 2016-06-30 NOTE — Progress Notes (Signed)
Patient [name], male DOB:[birth year], 50 y.o. [id number]  Chief Complaint  Patient presents with  . Knee Pain    bilateral knee pain    HPI  [name] is a 50 y.o. male who has chronic pain of both knees.  He has no new trauma.  He has no giving way.  He has swelling and popping.  He has no redness.  He wants some knee braces and I have given Rx for it. HPI  There is no height or weight on file to calculate BMI.  ROS  Review of Systems  HENT: Negative for congestion.   Respiratory: Positive for shortness of breath. Negative for cough.   Cardiovascular: Positive for leg swelling. Negative for chest pain.  Endocrine: Negative for cold intolerance.  Musculoskeletal: Positive for arthralgias, back pain, gait problem, joint swelling and myalgias.  Allergic/Immunologic: Positive for environmental allergies.  Neurological: Negative for numbness.    Past Medical History:  Diagnosis Date  . Arthritis   . Diabetes mellitus without complication ([place])   . Gout   . Hypertension    boarderline    Past Surgical History:  Procedure Laterality Date  . HERNIA REPAIR      No family history on file.  Social History Social History  Substance Use Topics  . Smoking status: Never Smoker  . Smokeless tobacco: Never Used  . Alcohol use No    No Known Allergies  Current Outpatient Prescriptions  Medication Sig Dispense Refill  . cyclobenzaprine (FLEXERIL) 10 MG tablet Take 10 mg by mouth 3 (three) times daily as needed for muscle spasms.    [name] lisinopril (PRINIVIL,ZESTRIL) 5 MG tablet Take 5 mg by mouth daily.  0  . metFORMIN (GLUCOPHAGE) 500 MG tablet 500 mg 2 (two) times daily with a meal.   2  . naproxen (NAPROSYN) 500 MG tablet Take 500 mg by mouth 2 (two) times daily with a meal.    . Oxycodone HCl 10 MG TABS One every four to six hours as need for pain.  Must last 30 days.  Do not drive a car or operate machinery when taking this medicine. 150 tablet 0   No current  facility-administered medications for this visit.      Physical Exam  Blood pressure (!) 148/98, pulse 87, temperature 97.5 F (36.4 C), height 5\' 8"  (1.727 m).  Constitutional: overall normal hygiene, normal nutrition, well developed, normal grooming, normal body habitus. Assistive device:none  Musculoskeletal: gait and station Limp right, muscle tone and strength are normal, no tremors or atrophy is present.  .  Neurological: coordination overall normal.  Deep tendon reflex/nerve stretch intact.  Sensation normal.  Cranial nerves II-XII intact.   Skin:   Normal overall no scars, lesions, ulcers or rashes. No psoriasis.  Psychiatric: Alert and oriented x 3.  Recent memory intact, remote memory unclear.  Normal mood and affect. Well groomed.  Good eye contact.  Cardiovascular: overall no swelling, no varicosities, no edema bilaterally, normal temperatures of the legs and arms, no clubbing, cyanosis and good capillary refill.  Lymphatic: palpation is normal.  The bilateral lower extremity is examined:  Inspection:  Thigh:  Non-tender and no defects  Knee has swelling 1+ effusion.                        Joint tenderness is present  Patient is tender over the medial joint line  Lower Leg:  Has normal appearance and no tenderness or defects  Ankle:  Non-tender and no defects  Foot:  Non-tender and no defects Range of Motion:  Knee:  Range of motion is: 0-95 right, 0-100 left                        Crepitus is  present  Ankle:  Range of motion is normal. Strength and Tone:  The bilateral lower extremity has normal strength and tone. Stability:  Knee:  The knee is stable.  Ankle:  The ankle is stable.    The patient has been educated about the nature of the problem(s) and counseled on treatment options.  The patient appeared to understand what I have discussed and is in agreement with it.  Encounter Diagnoses  Name Primary?  . Chronic pain of right  knee Yes  . Chronic pain of left knee   . Morbid obesity due to excess calories [facility])     PLAN Call if any problems.  Precautions discussed.  Continue current medications.   Return to clinic 1 month   Electronically Signed Sanjuana Kava, MD 10/25/20172:30 PM

## 2016-08-04 ENCOUNTER — Encounter: Payer: Self-pay | Admitting: Orthopaedic Surgery

## 2016-08-04 ENCOUNTER — Ambulatory Visit (INDEPENDENT_AMBULATORY_CARE_PROVIDER_SITE_OTHER): Payer: BLUE CROSS/BLUE SHIELD | Admitting: Orthopaedic Surgery

## 2016-08-04 VITALS — BP 133/86 | HR 107 | Temp 97.5°F

## 2016-08-04 DIAGNOSIS — M25561 Pain in right knee: Secondary | ICD-10-CM

## 2016-08-04 DIAGNOSIS — M25562 Pain in left knee: Secondary | ICD-10-CM | POA: Diagnosis not present

## 2016-08-04 DIAGNOSIS — G8929 Other chronic pain: Secondary | ICD-10-CM

## 2016-08-04 MED ORDER — OXYCODONE HCL 10 MG PO TABS: ORAL_TABLET | ORAL | 0 refills | Status: DC

## 2016-08-04 NOTE — Progress Notes (Signed)
Patient [name], male DOB:20-Aug-1966, 50 y.o. [id number]  Chief Complaint  Patient presents with  . Follow-up    chronic knee pain    HPI  [name] is a 50 y.o. male who has bilateral knee pain. He has swelling and popping but no giving way or locking.  He has no redness or numbness.  He is markedly obese. HPI  There is no height or weight on file to calculate BMI.  ROS  Review of Systems  HENT: Negative for congestion.   Respiratory: Positive for shortness of breath. Negative for cough.   Cardiovascular: Positive for leg swelling. Negative for chest pain.  Endocrine: Negative for cold intolerance.  Musculoskeletal: Positive for arthralgias, back pain, gait problem, joint swelling and myalgias.  Allergic/Immunologic: Positive for environmental allergies.  Neurological: Negative for numbness.    Past Medical History:  Diagnosis Date  . Arthritis   . Diabetes mellitus without complication ([place])   . Gout   . Hypertension    boarderline    Past Surgical History:  Procedure Laterality Date  . HERNIA REPAIR      No family history on file.  Social History Social History  Substance Use Topics  . Smoking status: Never Smoker  . Smokeless tobacco: Never Used  . Alcohol use No    No Known Allergies  Current Outpatient Prescriptions  Medication Sig Dispense Refill  . cyclobenzaprine (FLEXERIL) 10 MG tablet Take 10 mg by mouth 3 (three) times daily as needed for muscle spasms.    [name] lisinopril (PRINIVIL,ZESTRIL) 5 MG tablet Take 5 mg by mouth daily.  0  . metFORMIN (GLUCOPHAGE) 500 MG tablet 500 mg 2 (two) times daily with a meal.   2  . naproxen (NAPROSYN) 500 MG tablet Take 500 mg by mouth 2 (two) times daily with a meal.    . Oxycodone HCl 10 MG TABS One every four to six hours as need for pain.  Must last 30 days.  Do not drive a car or operate machinery when taking this medicine. 150 tablet 0   No current facility-administered medications for this  visit.      Physical Exam  Blood pressure 133/86, pulse (!) 107, temperature 97.5 F (36.4 C).  Constitutional: overall normal hygiene, normal nutrition, well developed, normal grooming, normal body habitus. Assistive device:none  Musculoskeletal: gait and station Limp right, muscle tone and strength are normal, no tremors or atrophy is present.  .  Neurological: coordination overall normal.  Deep tendon reflex/nerve stretch intact.  Sensation normal.  Cranial nerves II-XII intact.   Skin:   Normal overall no scars, lesions, ulcers or rashes. No psoriasis.  Psychiatric: Alert and oriented x 3.  Recent memory intact, remote memory unclear.  Normal mood and affect. Well groomed.  Good eye contact.  Cardiovascular: overall no swelling, no varicosities, no edema bilaterally, normal temperatures of the legs and arms, no clubbing, cyanosis and good capillary refill.  Lymphatic: palpation is normal.  The bilateral lower extremity is examined:  Inspection:  Thigh:  Non-tender and no defects  Knee has swelling 1+ effusion.                        Joint tenderness is present                        Patient is tender over the medial joint line  Lower Leg:  Has normal appearance and no tenderness or  defects  Ankle:  Non-tender and no defects  Foot:  Non-tender and no defects Range of Motion:  Knee:  Range of motion is: 0-100 right, 0 to 105 left                        Crepitus is  present  Ankle:  Range of motion is normal. Strength and Tone:  The bilateral lower extremity has normal strength and tone. Stability:  Knee:  The knee is stable.  Ankle:  The ankle is stable.    The patient has been educated about the nature of the problem(s) and counseled on treatment options.  The patient appeared to understand what I have discussed and is in agreement with it.  Encounter Diagnoses  Name Primary?  . Chronic pain of right knee Yes  . Chronic pain of left knee   . Morbid obesity due  to excess calories [facility])     PLAN Call if any problems.  Precautions discussed.  Continue current medications.   Return to clinic 6 weeks   Electronically Signed Sanjuana Kava, MD 11/29/20172:36 PM

## 2016-08-24 ENCOUNTER — Ambulatory Visit (INDEPENDENT_AMBULATORY_CARE_PROVIDER_SITE_OTHER): Payer: BLUE CROSS/BLUE SHIELD

## 2016-08-24 ENCOUNTER — Ambulatory Visit (INDEPENDENT_AMBULATORY_CARE_PROVIDER_SITE_OTHER): Payer: BLUE CROSS/BLUE SHIELD | Admitting: Orthopaedic Surgery

## 2016-08-24 VITALS — BP 154/99 | HR 98 | Temp 97.7°F | Ht 68.0 in

## 2016-08-24 DIAGNOSIS — M25562 Pain in left knee: Secondary | ICD-10-CM | POA: Diagnosis not present

## 2016-08-24 DIAGNOSIS — G8929 Other chronic pain: Secondary | ICD-10-CM | POA: Diagnosis not present

## 2016-08-24 DIAGNOSIS — M25561 Pain in right knee: Secondary | ICD-10-CM | POA: Diagnosis not present

## 2016-08-24 DIAGNOSIS — M542 Cervicalgia: Secondary | ICD-10-CM

## 2016-08-24 MED ORDER — PREDNISONE 10 MG (21) PO TBPK: ORAL_TABLET | ORAL | 1 refills | Status: DC

## 2016-08-24 MED ORDER — OXYCODONE HCL 10 MG PO TABS: ORAL_TABLET | ORAL | 0 refills | Status: DC

## 2016-08-24 NOTE — Progress Notes (Signed)
Patient [name], male DOB:August 01, 1966, 50 y.o. [id number]  Chief Complaint  Patient presents with  . Follow-up    Left Arm and shoulder pain    HPI  [name] is a 50 y.o. male who has developed paresthesias of the left hand and arm.  He has no new trauma.  He has slight weakness at times he says of the left hand.  He has taken his pain medicine and Naprosyn with no help. I will have him stop the Naprosyn and begin prednisone dose pack.  His knee pain is the same with no new events.  He has chronic pain of both knees. HPI  There is no height or weight on file to calculate BMI.  ROS  Review of Systems  HENT: Negative for congestion.   Respiratory: Positive for shortness of breath. Negative for cough.   Cardiovascular: Positive for leg swelling. Negative for chest pain.  Endocrine: Negative for cold intolerance.  Musculoskeletal: Positive for arthralgias, back pain, gait problem, joint swelling and myalgias.  Allergic/Immunologic: Positive for environmental allergies.  Neurological: Negative for numbness.    Past Medical History:  Diagnosis Date  . Arthritis   . Diabetes mellitus without complication ([place])   . Gout   . Hypertension    boarderline    Past Surgical History:  Procedure Laterality Date  . HERNIA REPAIR      No family history on file.  Social History Social History  Substance Use Topics  . Smoking status: Never Smoker  . Smokeless tobacco: Never Used  . Alcohol use No    No Known Allergies  Current Outpatient Prescriptions  Medication Sig Dispense Refill  . cyclobenzaprine (FLEXERIL) 10 MG tablet Take 10 mg by mouth 3 (three) times daily as needed for muscle spasms.    [name] lisinopril (PRINIVIL,ZESTRIL) 5 MG tablet Take 5 mg by mouth daily.  0  . metFORMIN (GLUCOPHAGE) 500 MG tablet 500 mg 2 (two) times daily with a meal.   2  . naproxen (NAPROSYN) 500 MG tablet Take 500 mg by mouth 2 (two) times daily with a meal.    . Oxycodone HCl 10 MG  TABS One every four to six hours as need for pain.  Must last 30 days.  Do not drive a car or operate machinery when taking this medicine. 150 tablet 0  . predniSONE (STERAPRED UNI-PAK 21 TAB) 10 MG (21) TBPK tablet Take six pills the first day;5 pills the next day;4 pills the next day;3 pills the next day; 2 pills the next day,one the final day. 21 tablet 1   No current facility-administered medications for this visit.      Physical Exam  Blood pressure (!) 154/99, pulse 98, temperature 97.7 F (36.5 C), height 5\' 8"  (1.727 m).  Constitutional: overall normal hygiene, normal nutrition, well developed, normal grooming, normal body habitus. Assistive device:none  Musculoskeletal: gait and station Limp right, muscle tone and strength are normal, no tremors or atrophy is present.  .  Neurological: coordination overall normal.  Deep tendon reflex/nerve stretch intact.  Sensation normal.  Cranial nerves II-XII intact.   Skin:   Normal overall no scars, lesions, ulcers or rashes. No psoriasis.  Psychiatric: Alert and oriented x 3.  Recent memory intact, remote memory unclear.  Normal mood and affect. Well groomed.  Good eye contact.  Cardiovascular: overall no swelling, no varicosities, no edema bilaterally, normal temperatures of the legs and arms, no clubbing, cyanosis and good capillary refill.  Lymphatic: palpation is  normal.  He has full motion of the left shoulder put has pain running down to the index finger and thumb from his neck and shoulder area.  Grip is good.  NV intact.  The bilateral lower extremity is examined:  Inspection:  Thigh:  Non-tender and no defects  Knee has swelling 1+ effusion.                        Joint tenderness is present                        Patient is tender over the medial joint line  Lower Leg:  Has normal appearance and no tenderness or defects  Ankle:  Non-tender and no defects  Foot:  Non-tender and no defects Range of Motion:  Knee:  Range  of motion is: 0-100 left and 0 to 95 right                        Crepitus is  present  Ankle:  Range of motion is normal. Strength and Tone:  The bilateral lower extremity has normal strength and tone. Stability:  Knee:  The knee is stable.  Ankle:  The ankle is stable.    The patient has been educated about the nature of the problem(s) and counseled on treatment options.  The patient appeared to understand what I have discussed and is in agreement with it.  Encounter Diagnoses  Name Primary?  . Cervicalgia Yes  . Chronic pain of right knee   . Chronic pain of left knee     PLAN Call if any problems.  Precautions discussed.  Continue current medications.   Return to clinic 2 weeks   Electronically [place], MD 12/19/20172:38 PM

## 2016-08-26 ENCOUNTER — Other Ambulatory Visit (HOSPITAL_COMMUNITY): Payer: Self-pay | Admitting: Family Medicine

## 2016-08-26 DIAGNOSIS — R109 Unspecified abdominal pain: Secondary | ICD-10-CM

## 2016-08-27 ENCOUNTER — Ambulatory Visit (INDEPENDENT_AMBULATORY_CARE_PROVIDER_SITE_OTHER): Payer: BLUE CROSS/BLUE SHIELD | Admitting: Urology

## 2016-08-27 DIAGNOSIS — N481 Balanitis: Secondary | ICD-10-CM

## 2016-08-27 DIAGNOSIS — N3941 Urge incontinence: Secondary | ICD-10-CM | POA: Diagnosis not present

## 2016-08-27 DIAGNOSIS — N5201 Erectile dysfunction due to arterial insufficiency: Secondary | ICD-10-CM

## 2016-08-31 ENCOUNTER — Ambulatory Visit (HOSPITAL_COMMUNITY)
Admission: RE | Admit: 2016-08-31 | Discharge: 2016-08-31 | Disposition: A | Discharge status: Home / Self Care | Payer: BLUE CROSS/BLUE SHIELD | Source: Ambulatory Visit | Attending: Family Medicine | Admitting: Family Medicine

## 2016-08-31 DIAGNOSIS — K760 Fatty (change of) liver, not elsewhere classified: Secondary | ICD-10-CM | POA: Insufficient documentation

## 2016-08-31 DIAGNOSIS — R109 Unspecified abdominal pain: Secondary | ICD-10-CM | POA: Diagnosis present

## 2016-09-07 ENCOUNTER — Ambulatory Visit: Payer: BLUE CROSS/BLUE SHIELD | Admitting: Orthopaedic Surgery

## 2016-09-07 ENCOUNTER — Encounter: Payer: Self-pay | Admitting: Orthopaedic Surgery

## 2016-09-10 ENCOUNTER — Other Ambulatory Visit: Payer: Self-pay | Admitting: Orthopaedic Surgery

## 2016-09-13 NOTE — Telephone Encounter (Signed)
Denied.  Long term use of anti-spasm medicine not recommended.

## 2016-09-15 ENCOUNTER — Ambulatory Visit: Payer: BLUE CROSS/BLUE SHIELD | Admitting: Orthopaedic Surgery

## 2016-09-22 ENCOUNTER — Ambulatory Visit: Payer: BLUE CROSS/BLUE SHIELD | Admitting: Orthopaedic Surgery

## 2016-09-23 ENCOUNTER — Ambulatory Visit: Payer: BLUE CROSS/BLUE SHIELD | Admitting: Orthopaedic Surgery

## 2016-09-24 ENCOUNTER — Ambulatory Visit: Payer: BLUE CROSS/BLUE SHIELD | Admitting: Orthopaedic Surgery

## 2016-09-28 ENCOUNTER — Encounter: Payer: Self-pay | Admitting: Orthopaedic Surgery

## 2016-09-28 ENCOUNTER — Ambulatory Visit (INDEPENDENT_AMBULATORY_CARE_PROVIDER_SITE_OTHER): Payer: BLUE CROSS/BLUE SHIELD | Admitting: Orthopaedic Surgery

## 2016-09-28 VITALS — BP 156/85 | HR 92 | Temp 97.5°F | Ht 68.0 in | Wt 387.0 lb

## 2016-09-28 DIAGNOSIS — G5602 Carpal tunnel syndrome, left upper limb: Secondary | ICD-10-CM | POA: Diagnosis not present

## 2016-09-28 DIAGNOSIS — M542 Cervicalgia: Secondary | ICD-10-CM

## 2016-09-28 MED ORDER — OXYCODONE HCL 10 MG PO TABS: ORAL_TABLET | ORAL | 0 refills | Status: DC

## 2016-09-28 NOTE — Progress Notes (Signed)
Patient [name], male DOB:Dec 09, 1965, 51 y.o. [id number]  Chief Complaint  Patient presents with  . Follow-up    neck and bilateral knee pain    HPI  [name] is a 51 y.o. male who has chronic pain of his neck and knees.  He has developed pain now of the left hand with nocturnal numbness and pain in the median nerve distribution.  I will get EMG  I renewed his pain medicine after first checking the state narcotic site. HPI  Body mass index is 58.84 kg/m.  ROS  Review of Systems  HENT: Negative for congestion.   Respiratory: Positive for shortness of breath. Negative for cough.   Cardiovascular: Positive for leg swelling. Negative for chest pain.  Endocrine: Negative for cold intolerance.  Musculoskeletal: Positive for arthralgias, back pain, gait problem, joint swelling and myalgias.  Allergic/Immunologic: Positive for environmental allergies.  Neurological: Negative for numbness.    Past Medical History:  Diagnosis Date  . Arthritis   . Diabetes mellitus without complication ([place])   . Gout   . Hypertension    boarderline    Past Surgical History:  Procedure Laterality Date  . HERNIA REPAIR      No family history on file.  Social History Social History  Substance Use Topics  . Smoking status: Never Smoker  . Smokeless tobacco: Never Used  . Alcohol use No    No Known Allergies  Current Outpatient Prescriptions  Medication Sig Dispense Refill  . cyclobenzaprine (FLEXERIL) 10 MG tablet Take 10 mg by mouth 3 (three) times daily as needed for muscle spasms.    [name] lisinopril (PRINIVIL,ZESTRIL) 5 MG tablet Take 5 mg by mouth daily.  0  . metFORMIN (GLUCOPHAGE) 500 MG tablet 500 mg 2 (two) times daily with a meal.   2  . naproxen (NAPROSYN) 500 MG tablet Take 500 mg by mouth 2 (two) times daily with a meal.    . Oxycodone HCl 10 MG TABS One every four to six hours as need for pain.  Must last 30 days.  Do not drive a car or operate machinery when  taking this medicine. 150 tablet 0  . predniSONE (STERAPRED UNI-PAK 21 TAB) 10 MG (21) TBPK tablet Take six pills the first day;5 pills the next day;4 pills the next day;3 pills the next day; 2 pills the next day,one the final day. 21 tablet 1   No current facility-administered medications for this visit.      Physical Exam  Blood pressure (!) 156/85, pulse 92, temperature 97.5 F (36.4 C), height 5\' 8"  (1.727 m), weight (!) 387 lb (175.5 kg).  Constitutional: overall normal hygiene, normal nutrition, well developed, normal grooming, normal body habitus. Assistive device:none  Musculoskeletal: gait and station Limp right, muscle tone and strength are normal, no tremors or atrophy is present.  .  Neurological: coordination overall normal.  Deep tendon reflex/nerve stretch intact.  Sensation normal.  Cranial nerves II-XII intact.   Skin:   Normal overall no scars, lesions, ulcers or rashes. No psoriasis.  Psychiatric: Alert and oriented x 3.  Recent memory intact, remote memory unclear.  Normal mood and affect. Well groomed.  Good eye contact.  Cardiovascular: overall no swelling, no varicosities, no edema bilaterally, normal temperatures of the legs and arms, no clubbing, cyanosis and good capillary refill.  Lymphatic: palpation is normal.  The right lower extremity is examined:  Inspection:  Thigh:  Non-tender and no defects  Knee has swelling 1+ effusion.  Joint tenderness is present                        Patient is tender over the medial joint line  Lower Leg:  Has normal appearance and no tenderness or defects  Ankle:  Non-tender and no defects  Foot:  Non-tender and no defects Range of Motion:  Knee:  Range of motion is: 0-100                        Crepitus is  present  Ankle:  Range of motion is normal. Strength and Tone:  The right lower extremity has normal strength and tone. Stability:  Knee:  The knee is stable.  Ankle:  The ankle is  stable.  His left hand has positive Phalen and Positive Tinels with median nerve decreased sensitivity.  The patient has been educated about the nature of the problem(s) and counseled on treatment options.  The patient appeared to understand what I have discussed and is in agreement with it.  Encounter Diagnoses  Name Primary?  . Cervicalgia Yes  . Morbid obesity due to excess calories ([name])   . Carpal tunnel syndrome on left     PLAN Call if any problems.  Precautions discussed.  Continue current medications.   Return to clinic 1 month   Get EMGs.  Electronically Signed Sanjuana Kava, MD 1/23/20183:41 PM

## 2016-09-28 NOTE — Addendum Note (Signed)
Addended by: Glory Buff on: 09/28/2016 03:47 PM   Modules accepted: Orders

## 2016-10-06 ENCOUNTER — Other Ambulatory Visit (HOSPITAL_COMMUNITY): Payer: Self-pay | Admitting: Family Medicine

## 2016-10-06 DIAGNOSIS — R109 Unspecified abdominal pain: Secondary | ICD-10-CM

## 2016-10-12 ENCOUNTER — Ambulatory Visit: Payer: BLUE CROSS/BLUE SHIELD | Admitting: Orthopaedic Surgery

## 2016-10-13 ENCOUNTER — Encounter: Payer: Self-pay | Admitting: Orthopaedic Surgery

## 2016-10-13 ENCOUNTER — Telehealth: Payer: Self-pay | Admitting: Orthopedic Surgery

## 2016-10-13 ENCOUNTER — Ambulatory Visit (INDEPENDENT_AMBULATORY_CARE_PROVIDER_SITE_OTHER): Payer: BLUE CROSS/BLUE SHIELD | Admitting: Orthopaedic Surgery

## 2016-10-13 VITALS — BP 153/105 | HR 84 | Temp 97.7°F | Ht 68.0 in | Wt 387.0 lb

## 2016-10-13 DIAGNOSIS — G5602 Carpal tunnel syndrome, left upper limb: Secondary | ICD-10-CM

## 2016-10-13 DIAGNOSIS — G5601 Carpal tunnel syndrome, right upper limb: Secondary | ICD-10-CM

## 2016-10-13 NOTE — Progress Notes (Signed)
Patient [name], male DOB:11/13/1965, 51 y.o. [id number]  Chief Complaint  Patient presents with  . Follow-up    EMG RESULT REVIEW    HPI  [name] is a 51 y.o. male who has had carpal tunnel symptoms bilaterally getting worse.  He had EMGs done which show bilateral carpal tunnel syndrome, worse on the right.  These were done 10-05-16 in [place].  I have explained the findings to him and recommended surgery.  He agrees and will see Dr. Aline Brochure for the procedure. HPI  Body mass index is 58.84 kg/m.  ROS  Review of Systems  HENT: Negative for congestion.   Respiratory: Positive for shortness of breath. Negative for cough.   Cardiovascular: Positive for leg swelling. Negative for chest pain.  Endocrine: Negative for cold intolerance.  Musculoskeletal: Positive for arthralgias, back pain, gait problem, joint swelling and myalgias.  Allergic/Immunologic: Positive for environmental allergies.  Neurological: Negative for numbness.    Past Medical History:  Diagnosis Date  . Arthritis   . Diabetes mellitus without complication ([place])   . Gout   . Hypertension    boarderline    Past Surgical History:  Procedure Laterality Date  . HERNIA REPAIR      No family history on file.  Social History Social History  Substance Use Topics  . Smoking status: Never Smoker  . Smokeless tobacco: Never Used  . Alcohol use No    No Known Allergies  Current Outpatient Prescriptions  Medication Sig Dispense Refill  . cyclobenzaprine (FLEXERIL) 10 MG tablet Take 10 mg by mouth 3 (three) times daily as needed for muscle spasms.    [name] lisinopril (PRINIVIL,ZESTRIL) 5 MG tablet Take 5 mg by mouth daily.  0  . metFORMIN (GLUCOPHAGE) 500 MG tablet 500 mg 2 (two) times daily with a meal.   2  . naproxen (NAPROSYN) 500 MG tablet Take 500 mg by mouth 2 (two) times daily with a meal.    . Oxycodone HCl 10 MG TABS One every four to six hours as need for pain.  Must last 30 days.  Do  not drive a car or operate machinery when taking this medicine. 150 tablet 0  . predniSONE (STERAPRED UNI-PAK 21 TAB) 10 MG (21) TBPK tablet Take six pills the first day;5 pills the next day;4 pills the next day;3 pills the next day; 2 pills the next day,one the final day. 21 tablet 1   No current facility-administered medications for this visit.      Physical Exam  Blood pressure (!) 153/105, pulse 84, temperature 97.7 F (36.5 C), height 5\' 8"  (1.727 m), weight (!) 387 lb (175.5 kg).  Constitutional: overall normal hygiene, normal nutrition, well developed, normal grooming, normal body habitus. Assistive device:none  Musculoskeletal: gait and station Limp none, muscle tone and strength are normal, no tremors or atrophy is present.  .  Neurological: coordination overall normal.  Deep tendon reflex/nerve stretch intact.  Sensation normal.  Cranial nerves II-XII intact.   Skin:   Normal overall no scars, lesions, ulcers or rashes. No psoriasis.  Psychiatric: Alert and oriented x 3.  Recent memory intact, remote memory unclear.  Normal mood and affect. Well groomed.  Good eye contact.  Cardiovascular: overall no swelling, no varicosities, no edema bilaterally, normal temperatures of the legs and arms, no clubbing, cyanosis and good capillary refill.  Lymphatic: palpation is normal.  He has bilateral positive Tinel and Phalen signs of both wrists and numbness in the median nerve distributions.  The patient has been educated about the nature of the problem(s) and counseled on treatment options.  The patient appeared to understand what I have discussed and is in agreement with it.  Encounter Diagnoses  Name Primary?  . Carpal tunnel syndrome on left Yes  . Carpal tunnel syndrome on right     PLAN Call if any problems.  Precautions discussed.  Continue current medications.   Return to clinic to see Dr. Aline Brochure   Electronically Signed [name] Kava, MD 2/7/20184:05 PM

## 2016-10-13 NOTE — Telephone Encounter (Signed)
Patient of Dr Brooke Bonito, per office visit 10/13/16 - please advise of  appiontment to discuss CT surgery

## 2016-10-14 NOTE — Telephone Encounter (Signed)
Called patient; scheduled accordingly.

## 2016-10-14 NOTE — Telephone Encounter (Signed)
Feb 26th

## 2016-10-15 ENCOUNTER — Encounter (HOSPITAL_COMMUNITY): Payer: Self-pay

## 2016-10-15 ENCOUNTER — Ambulatory Visit (HOSPITAL_COMMUNITY): Payer: BLUE CROSS/BLUE SHIELD

## 2016-10-22 ENCOUNTER — Ambulatory Visit (INDEPENDENT_AMBULATORY_CARE_PROVIDER_SITE_OTHER): Payer: BLUE CROSS/BLUE SHIELD | Admitting: Urology

## 2016-10-22 DIAGNOSIS — N5201 Erectile dysfunction due to arterial insufficiency: Secondary | ICD-10-CM

## 2016-10-22 DIAGNOSIS — E291 Testicular hypofunction: Secondary | ICD-10-CM

## 2016-10-26 ENCOUNTER — Ambulatory Visit: Payer: BLUE CROSS/BLUE SHIELD | Admitting: Orthopaedic Surgery

## 2016-10-27 ENCOUNTER — Telehealth: Payer: Self-pay | Admitting: Orthopaedic Surgery

## 2016-10-27 MED ORDER — OXYCODONE HCL 10 MG PO TABS: ORAL_TABLET | ORAL | 0 refills | Status: DC

## 2016-10-27 NOTE — Telephone Encounter (Signed)
Patient called (states still working on MyChart access) to request refill:  Oxycodone HCl 10 MG TABS  0 09/28/2016    Sig: One every four to six hours as need for pain.     - states to see Dr Aline Brochure forconsult 11/01/16.

## 2016-10-29 ENCOUNTER — Telehealth: Payer: Self-pay | Admitting: Orthopedic Surgery

## 2016-10-29 NOTE — Telephone Encounter (Signed)
[name] of Dr. Luna Glasgow) was scheduled for Monday, 11-01-16 to be seen for discussion of CT surgery.  He called today and had to cancel this appointment due to a death in his family.  He said the funeral is on Monday and he needed to attend that.  When can he be rescheduled to see you?

## 2016-10-29 NOTE — Telephone Encounter (Signed)
Wednesday 28TH

## 2016-11-01 ENCOUNTER — Ambulatory Visit: Payer: BLUE CROSS/BLUE SHIELD | Admitting: Orthopedic Surgery

## 2016-11-03 ENCOUNTER — Encounter: Payer: Self-pay | Admitting: Orthopedic Surgery

## 2016-11-03 ENCOUNTER — Ambulatory Visit (INDEPENDENT_AMBULATORY_CARE_PROVIDER_SITE_OTHER): Payer: BLUE CROSS/BLUE SHIELD | Admitting: Orthopedic Surgery

## 2016-11-03 VITALS — BP 130/81 | HR 95 | Ht 68.0 in | Wt 387.0 lb

## 2016-11-03 DIAGNOSIS — G5602 Carpal tunnel syndrome, left upper limb: Secondary | ICD-10-CM

## 2016-11-03 DIAGNOSIS — G5601 Carpal tunnel syndrome, right upper limb: Secondary | ICD-10-CM | POA: Diagnosis not present

## 2016-11-03 MED ORDER — GABAPENTIN 100 MG PO CAPS: 100.0000 mg | ORAL_CAPSULE | Freq: Every day | ORAL | 0 refills | Status: DC

## 2016-11-03 NOTE — Progress Notes (Signed)
Patient ID: [name], male   DOB: Feb 28, 1966, 51 y.o.   MRN: [id number]  Chief Complaint  Patient presents with  . Follow-up    Discuss CT surgery, Dr. Brooke Bonito patient.    HPI [name] is a 51 y.o. male.  PRESENTS FOR POSSIBLE CARPAL TUNNEL RELEASE  Carpal Tunnel Syndrome: Patient presents for presents evaluation of pain in hands, hand paresthesias and possible carpal tunnel syndrome.  Onset of the symptoms was several months ago. Prior symptoms include tingling involving the left thumb index long and part of the ring finger. However, he says that since he took the dose pack and some time is gone by that his symptoms are not as bad. He initially had some neck symptoms which suggested a lesion in the cervical spine but that proved out negative and he would like to try an alternative treatment    Review of Systems Review of Systems  Respiratory: Negative for shortness of breath.   Cardiovascular: Negative for chest pain.   (2 MINIMUM)  Past Medical History:  Diagnosis Date  . Arthritis   . Diabetes mellitus without complication ([place])   . Gout   . Hypertension    boarderline    Past Surgical History:  Procedure Laterality Date  . HERNIA REPAIR      Social History Social History  Substance Use Topics  . Smoking status: Never Smoker  . Smokeless tobacco: Never Used  . Alcohol use No    No Known Allergies  No outpatient prescriptions have been marked as taking for the 11/03/16 encounter (Appointment) with Carole Civil, MD.      Physical Exam Physical Exam 1.There were no vitals taken for this visit.  2. Gen. appearance. The patient is well-developed and well-nourished, grooming and hygiene are normal. There are no gross congenital abnormalities  3. The patient is alert and oriented to person place and time  4. Mood and affect are normal  5. Ambulation Normal ambulation   Examination reveals the following: 6. On inspection we find no tenderness over  the carpal tunnel no swelling of the hand  He continues to have full range of motion  Strength tests were normal. 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

## 2016-11-23 ENCOUNTER — Ambulatory Visit (INDEPENDENT_AMBULATORY_CARE_PROVIDER_SITE_OTHER): Payer: BLUE CROSS/BLUE SHIELD | Admitting: Orthopaedic Surgery

## 2016-11-23 VITALS — BP 153/99 | HR 84 | Temp 97.3°F | Ht 68.0 in | Wt 373.0 lb

## 2016-11-23 DIAGNOSIS — G8929 Other chronic pain: Secondary | ICD-10-CM

## 2016-11-23 DIAGNOSIS — M25562 Pain in left knee: Secondary | ICD-10-CM

## 2016-11-23 DIAGNOSIS — M25561 Pain in right knee: Secondary | ICD-10-CM

## 2016-11-23 MED ORDER — OXYCODONE HCL 10 MG PO TABS: ORAL_TABLET | ORAL | 0 refills | Status: DC

## 2016-11-23 NOTE — Progress Notes (Signed)
Patient [name], male DOB:1966-01-26, 51 y.o. [id number]  Chief Complaint  Patient presents with  . Follow-up    Bilateral knee pain    HPI  [name] is a 51 y.o. male who has chronic bilateral knee pain.  He has swelling and popping but no locking or giving way.  He is trying to be active.   HPI  Body mass index is 56.71 kg/m.  ROS  Review of Systems  HENT: Negative for congestion.   Respiratory: Positive for shortness of breath. Negative for cough.   Cardiovascular: Positive for leg swelling. Negative for chest pain.  Endocrine: Negative for cold intolerance.  Musculoskeletal: Positive for arthralgias, back pain, gait problem, joint swelling and myalgias.  Allergic/Immunologic: Positive for environmental allergies.  Neurological: Negative for numbness.    Past Medical History:  Diagnosis Date  . Arthritis   . Diabetes mellitus without complication ([place])   . Gout   . Hypertension    boarderline    Past Surgical History:  Procedure Laterality Date  . HERNIA REPAIR      No family history on file.  Social History Social History  Substance Use Topics  . Smoking status: Never Smoker  . Smokeless tobacco: Never Used  . Alcohol use No    No Known Allergies  Current Outpatient Prescriptions  Medication Sig Dispense Refill  . cyclobenzaprine (FLEXERIL) 10 MG tablet Take 10 mg by mouth 3 (three) times daily as needed for muscle spasms.    [name] gabapentin (NEURONTIN) 100 MG capsule Take 1 capsule (100 mg total) by mouth at bedtime. 42 capsule 0  . lisinopril (PRINIVIL,ZESTRIL) 5 MG tablet Take 5 mg by mouth daily.  0  . metFORMIN (GLUCOPHAGE) 500 MG tablet 500 mg 2 (two) times daily with a meal.   2  . naproxen (NAPROSYN) 500 MG tablet Take 500 mg by mouth 2 (two) times daily with a meal.    . Oxycodone HCl 10 MG TABS One every four to six hours as need for pain.  Must last 30 days.  Do not drive a car or operate machinery when taking this medicine. 150  tablet 0  . predniSONE (STERAPRED UNI-PAK 21 TAB) 10 MG (21) TBPK tablet Take six pills the first day;5 pills the next day;4 pills the next day;3 pills the next day; 2 pills the next day,one the final day. 21 tablet 1   No current facility-administered medications for this visit.      Physical Exam  Blood pressure (!) 153/99, pulse 84, temperature 97.3 F (36.3 C), height 5\' 8"  (1.727 m), weight (!) 373 lb (169.2 kg).  Constitutional: overall normal hygiene, normal nutrition, well developed, normal grooming, normal body habitus. Assistive device:none  Musculoskeletal: gait and station Limp right, muscle tone and strength are normal, no tremors or atrophy is present.  .  Neurological: coordination overall normal.  Deep tendon reflex/nerve stretch intact.  Sensation normal.  Cranial nerves II-XII intact.   Skin:   Normal overall no scars, lesions, ulcers or rashes. No psoriasis.  Psychiatric: Alert and oriented x 3.  Recent memory intact, remote memory unclear.  Normal mood and affect. Well groomed.  Good eye contact.  Cardiovascular: overall no swelling, no varicosities, no edema bilaterally, normal temperatures of the legs and arms, no clubbing, cyanosis and good capillary refill.  Lymphatic: palpation is normal.  The bilateral lower extremity is examined:  Inspection:  Thigh:  Non-tender and no defects  Knee has swelling 1+ effusion.  Joint tenderness is present                        Patient is tender over the medial joint line  Lower Leg:  Has normal appearance and no tenderness or defects  Ankle:  Non-tender and no defects  Foot:  Non-tender and no defects Range of Motion:  Knee:  Range of motion is: 0-100 right, 0 to 105 left                        Crepitus is  present  Ankle:  Range of motion is normal. Strength and Tone:  The bilateral lower extremity has normal strength and tone. Stability:  Knee:  The knee is stable.  Ankle:  The ankle is  stable.    The patient has been educated about the nature of the problem(s) and counseled on treatment options.  The patient appeared to understand what I have discussed and is in agreement with it.  Encounter Diagnoses  Name Primary?  . Chronic pain of right knee Yes  . Chronic pain of left knee   . Morbid obesity due to excess calories [facility])     PLAN Call if any problems.  Precautions discussed.  Continue current medications.   Return to clinic 1 month   I have reviewed the [place] web site prior to prescribing narcotic medicine for this patient.  Electronically Signed Sanjuana Kava, MD 3/20/20183:11 PM

## 2016-12-21 ENCOUNTER — Ambulatory Visit (INDEPENDENT_AMBULATORY_CARE_PROVIDER_SITE_OTHER): Payer: BLUE CROSS/BLUE SHIELD | Admitting: Orthopaedic Surgery

## 2016-12-21 ENCOUNTER — Encounter: Payer: Self-pay | Admitting: Orthopaedic Surgery

## 2016-12-21 VITALS — BP 155/96 | HR 104 | Temp 97.9°F | Ht 68.0 in | Wt 374.0 lb

## 2016-12-21 DIAGNOSIS — G8929 Other chronic pain: Secondary | ICD-10-CM

## 2016-12-21 DIAGNOSIS — M25562 Pain in left knee: Secondary | ICD-10-CM | POA: Diagnosis not present

## 2016-12-21 DIAGNOSIS — M25561 Pain in right knee: Secondary | ICD-10-CM

## 2016-12-21 MED ORDER — OXYCODONE HCL 10 MG PO TABS: ORAL_TABLET | ORAL | 0 refills | Status: DC

## 2016-12-21 NOTE — Progress Notes (Signed)
Patient [name], male DOB:[birth year], 51 y.o. [id number]  Chief Complaint  Patient presents with  . Follow-up    bilateral knee pain    HPI  [name] is a 51 y.o. male who has bilateral knee pain.  He has no giving way, no locking.  He has swelling and popping.  He is massively overweight. HPI  Body mass index is 56.87 kg/m.  ROS  Review of Systems  HENT: Negative for congestion.   Respiratory: Positive for shortness of breath. Negative for cough.   Cardiovascular: Positive for leg swelling. Negative for chest pain.  Endocrine: Negative for cold intolerance.  Musculoskeletal: Positive for arthralgias, back pain, gait problem, joint swelling and myalgias.  Allergic/Immunologic: Positive for environmental allergies.  Neurological: Negative for numbness.    Past Medical History:  Diagnosis Date  . Arthritis   . Diabetes mellitus without complication ([place])   . Gout   . Hypertension    boarderline    Past Surgical History:  Procedure Laterality Date  . HERNIA REPAIR      No family history on file.  Social History Social History  Substance Use Topics  . Smoking status: Never Smoker  . Smokeless tobacco: Never Used  . Alcohol use No    No Known Allergies  Current Outpatient Prescriptions  Medication Sig Dispense Refill  . cyclobenzaprine (FLEXERIL) 10 MG tablet Take 10 mg by mouth 3 (three) times daily as needed for muscle spasms.    [name] gabapentin (NEURONTIN) 100 MG capsule Take 1 capsule (100 mg total) by mouth at bedtime. 42 capsule 0  . lisinopril (PRINIVIL,ZESTRIL) 5 MG tablet Take 5 mg by mouth daily.  0  . metFORMIN (GLUCOPHAGE) 500 MG tablet 500 mg 2 (two) times daily with a meal.   2  . naproxen (NAPROSYN) 500 MG tablet Take 500 mg by mouth 2 (two) times daily with a meal.    . Oxycodone HCl 10 MG TABS One every four to six hours as need for pain.  Must last 30 days.  Do not drive a car or operate machinery when taking this medicine. 150 tablet  0  . predniSONE (STERAPRED UNI-PAK 21 TAB) 10 MG (21) TBPK tablet Take six pills the first day;5 pills the next day;4 pills the next day;3 pills the next day; 2 pills the next day,one the final day. 21 tablet 1   No current facility-administered medications for this visit.      Physical Exam  Blood pressure (!) 155/96, pulse (!) 104, temperature 97.9 F (36.6 C), height 5\' 8"  (1.727 m), weight (!) 374 lb (169.6 kg).  Constitutional: overall normal hygiene, normal nutrition, well developed, normal grooming, normal body habitus. Assistive device:none  Musculoskeletal: gait and station Limp right, muscle tone and strength are normal, no tremors or atrophy is present.  .  Neurological: coordination overall normal.  Deep tendon reflex/nerve stretch intact.  Sensation normal.  Cranial nerves II-XII intact.   Skin:   Normal overall no scars, lesions, ulcers or rashes. No psoriasis.  Psychiatric: Alert and oriented x 3.  Recent memory intact, remote memory unclear.  Normal mood and affect. Well groomed.  Good eye contact.  Cardiovascular: overall no swelling, no varicosities, no edema bilaterally, normal temperatures of the legs and arms, no clubbing, cyanosis and good capillary refill.  Lymphatic: palpation is normal.  The bilateral lower extremity is examined:  Inspection:  Thigh:  Non-tender and no defects  Knee has swelling 1+ effusion.  Joint tenderness is present                        Patient is tender over the medial joint line  Lower Leg:  Has normal appearance and no tenderness or defects  Ankle:  Non-tender and no defects  Foot:  Non-tender and no defects Range of Motion:  Knee:  Range of motion is: 0-100 right; 0 to 105 left                        Crepitus is  present  Ankle:  Range of motion is normal. Strength and Tone:  The bilateral lower extremity has normal strength and tone. Stability:  Knee:  The knee is stable.  Ankle:  The ankle is  stable.    The patient has been educated about the nature of the problem(s) and counseled on treatment options.  The patient appeared to understand what I have discussed and is in agreement with it.  Encounter Diagnoses  Name Primary?  . Chronic pain of right knee Yes  . Chronic pain of left knee   . Morbid obesity due to excess calories [facility])     PLAN Call if any problems.  Precautions discussed.  Continue current medications.   Return to clinic 1 month   I have reviewed the [place] web site prior to prescribing narcotic medicine for this patient.  Electronically Signed Sanjuana Kava, MD 4/17/20183:59 PM

## 2017-01-14 ENCOUNTER — Ambulatory Visit: Payer: BLUE CROSS/BLUE SHIELD | Admitting: Urology

## 2017-01-18 ENCOUNTER — Ambulatory Visit (INDEPENDENT_AMBULATORY_CARE_PROVIDER_SITE_OTHER): Payer: BLUE CROSS/BLUE SHIELD | Admitting: Orthopaedic Surgery

## 2017-01-18 ENCOUNTER — Encounter: Payer: Self-pay | Admitting: Orthopaedic Surgery

## 2017-01-18 VITALS — BP 163/103 | HR 85 | Ht 68.0 in

## 2017-01-18 DIAGNOSIS — M25562 Pain in left knee: Secondary | ICD-10-CM

## 2017-01-18 DIAGNOSIS — M25561 Pain in right knee: Secondary | ICD-10-CM | POA: Diagnosis not present

## 2017-01-18 DIAGNOSIS — G8929 Other chronic pain: Secondary | ICD-10-CM

## 2017-01-18 MED ORDER — OXYCODONE HCL 10 MG PO TABS: ORAL_TABLET | ORAL | 0 refills | Status: DC

## 2017-01-18 NOTE — Progress Notes (Signed)
Patient [name], male DOB:December 01, 1965, 51 y.o. [name]  Chief Complaint  Patient presents with  . Follow-up    Chronic pain bilateral knees    HPI  [name] is a 51 y.o. male who has chronic knee pain.  He is stable.  He has no giving way or locking.  He has swelling and popping.  He is taking his medicine and being active.  He is morbidly obese. HPI  There is no height or weight on file to calculate BMI.  ROS  Review of Systems  HENT: Negative for congestion.   Respiratory: Positive for shortness of breath. Negative for cough.   Cardiovascular: Positive for leg swelling. Negative for chest pain.  Endocrine: Negative for cold intolerance.  Musculoskeletal: Positive for arthralgias, back pain, gait problem, joint swelling and myalgias.  Allergic/Immunologic: Positive for environmental allergies.  Neurological: Negative for numbness.    Past Medical History:  Diagnosis Date  . Arthritis   . Diabetes mellitus without complication ([place])   . Gout   . Hypertension    boarderline    Past Surgical History:  Procedure Laterality Date  . HERNIA REPAIR      History reviewed. No pertinent family history.  Social History Social History  Substance Use Topics  . Smoking status: Never Smoker  . Smokeless tobacco: Never Used  . Alcohol use No    No Known Allergies  Current Outpatient Prescriptions  Medication Sig Dispense Refill  . cyclobenzaprine (FLEXERIL) 10 MG tablet Take 10 mg by mouth 3 (three) times daily as needed for muscle spasms.    [name] gabapentin (NEURONTIN) 100 MG capsule Take 1 capsule (100 mg total) by mouth at bedtime. 42 capsule 0  . lisinopril (PRINIVIL,ZESTRIL) 5 MG tablet Take 5 mg by mouth daily.  0  . metFORMIN (GLUCOPHAGE) 500 MG tablet 500 mg 2 (two) times daily with a meal.   2  . naproxen (NAPROSYN) 500 MG tablet Take 500 mg by mouth 2 (two) times daily with a meal.    . Oxycodone HCl 10 MG TABS One every four to six hours as need for  pain.  Must last 30 days.  Do not drive a car or operate machinery when taking this medicine. 150 tablet 0  . predniSONE (STERAPRED UNI-PAK 21 TAB) 10 MG (21) TBPK tablet Take six pills the first day;5 pills the next day;4 pills the next day;3 pills the next day; 2 pills the next day,one the final day. 21 tablet 1   No current facility-administered medications for this visit.      Physical Exam  Blood pressure (!) 163/103, pulse 85, height 5\' 8"  (1.727 m).  He refused to use scales.  Constitutional: overall normal hygiene, normal nutrition, well developed, normal grooming, normal body habitus. Assistive device:none  Musculoskeletal: gait and station Limp right, muscle tone and strength are normal, no tremors or atrophy is present.  .  Neurological: coordination overall normal.  Deep tendon reflex/nerve stretch intact.  Sensation normal.  Cranial nerves II-XII intact.   Skin:   Normal overall no scars, lesions, ulcers or rashes. No psoriasis.  Psychiatric: Alert and oriented x 3.  Recent memory intact, remote memory unclear.  Normal mood and affect. Well groomed.  Good eye contact.  Cardiovascular: overall no swelling, no varicosities, no edema bilaterally, normal temperatures of the legs and arms, no clubbing, cyanosis and good capillary refill.  Lymphatic: palpation is normal.  The bilateral lower extremity is examined:  Inspection:  Thigh:  Non-tender  and no defects  Knee has swelling 1+ effusion.                        Joint tenderness is present                        Patient is tender over the medial joint line  Lower Leg:  Has normal appearance and no tenderness or defects  Ankle:  Non-tender and no defects  Foot:  Non-tender and no defects Range of Motion:  Knee:  Range of motion is: 0-105 right, 0 - 110 left                        Crepitus is  present  Ankle:  Range of motion is normal. Strength and Tone:  The bilateral lower extremity has normal strength and  tone. Stability:  Knee:  The knee is stable.  Ankle:  The ankle is stable.    The patient has been educated about the nature of the problem(s) and counseled on treatment options.  The patient appeared to understand what I have discussed and is in agreement with it.  Encounter Diagnoses  Name Primary?  . Chronic pain of right knee Yes  . Chronic pain of left knee   . Morbid obesity due to excess calories [facility])     PLAN Call if any problems.  Precautions discussed.  Continue current medications.   Return to clinic 2 months   I have reviewed the [place] web site prior to prescribing narcotic medicine for this patient.  Electronically Signed Sanjuana Kava, MD 5/15/20183:22 PM

## 2017-02-16 ENCOUNTER — Telehealth: Payer: Self-pay | Admitting: Orthopaedic Surgery

## 2017-02-16 MED ORDER — OXYCODONE HCL 10 MG PO TABS: ORAL_TABLET | ORAL | 0 refills | Status: DC

## 2017-02-16 NOTE — Telephone Encounter (Signed)
Patient called for refill:  Oxycodone HCl 10 MG TABS 150 tablet

## 2017-03-22 ENCOUNTER — Ambulatory Visit (INDEPENDENT_AMBULATORY_CARE_PROVIDER_SITE_OTHER): Payer: Self-pay | Admitting: Orthopaedic Surgery

## 2017-03-22 ENCOUNTER — Encounter: Payer: Self-pay | Admitting: Orthopaedic Surgery

## 2017-03-22 VITALS — BP 145/93 | HR 76 | Temp 97.1°F | Ht 68.0 in | Wt 390.0 lb

## 2017-03-22 DIAGNOSIS — M25562 Pain in left knee: Secondary | ICD-10-CM

## 2017-03-22 DIAGNOSIS — G8929 Other chronic pain: Secondary | ICD-10-CM

## 2017-03-22 DIAGNOSIS — M25561 Pain in right knee: Secondary | ICD-10-CM

## 2017-03-22 MED ORDER — OXYCODONE HCL 10 MG PO TABS: ORAL_TABLET | ORAL | 0 refills | Status: DC

## 2017-03-22 NOTE — Progress Notes (Signed)
Patient [name], male DOB:1965/11/14, 51 y.o. YIF:[id number]  Chief Complaint  Patient presents with  . Follow-up    Knee Pain    HPI  [name] is a 51 y.o. male who has bilateral knee pain.  He has popping and swelling but no giving way. He is morbidly obese.  He has no new trauma, no locking.  He is active. HPI  Body mass index is 59.3 kg/m.  ROS  Review of Systems  HENT: Negative for congestion.   Respiratory: Positive for shortness of breath. Negative for cough.   Cardiovascular: Positive for leg swelling. Negative for chest pain.  Endocrine: Negative for cold intolerance.  Musculoskeletal: Positive for arthralgias, back pain, gait problem, joint swelling and myalgias.  Allergic/Immunologic: Positive for environmental allergies.  Neurological: Negative for numbness.    Past Medical History:  Diagnosis Date  . Arthritis   . Diabetes mellitus without complication ([place])   . Gout   . Hypertension    boarderline    Past Surgical History:  Procedure Laterality Date  . HERNIA REPAIR      History reviewed. No pertinent family history.  Social History Social History  Substance Use Topics  . Smoking status: Never Smoker  . Smokeless tobacco: Never Used  . Alcohol use No    No Known Allergies  Current Outpatient Prescriptions  Medication Sig Dispense Refill  . cyclobenzaprine (FLEXERIL) 10 MG tablet Take 10 mg by mouth 3 (three) times daily as needed for muscle spasms.    [name] gabapentin (NEURONTIN) 100 MG capsule Take 1 capsule (100 mg total) by mouth at bedtime. 42 capsule 0  . lisinopril (PRINIVIL,ZESTRIL) 5 MG tablet Take 5 mg by mouth daily.  0  . metFORMIN (GLUCOPHAGE) 500 MG tablet 500 mg 2 (two) times daily with a meal.   2  . naproxen (NAPROSYN) 500 MG tablet Take 500 mg by mouth 2 (two) times daily with a meal.    . Oxycodone HCl 10 MG TABS One every four to six hours as need for pain.  Must last 30 days.  Do not drive a car or operate machinery  when taking this medicine. 150 tablet 0  . predniSONE (STERAPRED UNI-PAK 21 TAB) 10 MG (21) TBPK tablet Take six pills the first day;5 pills the next day;4 pills the next day;3 pills the next day; 2 pills the next day,one the final day. 21 tablet 1   No current facility-administered medications for this visit.      Physical Exam  Blood pressure (!) 145/93, pulse 76, temperature (!) 97.1 F (36.2 C), height 5\' 8"  (1.727 m), weight (!) 390 lb (176.9 kg).  Constitutional: overall normal hygiene, normal nutrition, well developed, normal grooming, normal body habitus. Assistive device:none  Musculoskeletal: gait and station Limp right, muscle tone and strength are normal, no tremors or atrophy is present.  .  Neurological: coordination overall normal.  Deep tendon reflex/nerve stretch intact.  Sensation normal.  Cranial nerves II-XII intact.   Skin:   Normal overall no scars, lesions, ulcers or rashes. No psoriasis.  Psychiatric: Alert and oriented x 3.  Recent memory intact, remote memory unclear.  Normal mood and affect. Well groomed.  Good eye contact.  Cardiovascular: overall no swelling, no varicosities, no edema bilaterally, normal temperatures of the legs and arms, no clubbing, cyanosis and good capillary refill.  Lymphatic: palpation is normal.  The bilateral lower extremity is examined:  Inspection:  Thigh:  Non-tender and no defects  Knee has swelling  1+ effusion.                        Joint tenderness is present                        Patient is tender over the medial joint line  Lower Leg:  Has normal appearance and no tenderness or defects  Ankle:  Non-tender and no defects  Foot:  Non-tender and no defects Range of Motion:  Knee:  Range of motion is: 0-100 right, 0 to 110 left                        Crepitus is  present  Ankle:  Range of motion is normal. Strength and Tone:  The bilateral lower extremity has normal strength and tone. Stability:  Knee:  The knee  is stable.  Ankle:  The ankle is stable.    The patient has been educated about the nature of the problem(s) and counseled on treatment options.  The patient appeared to understand what I have discussed and is in agreement with it.  Encounter Diagnoses  Name Primary?  . Chronic pain of right knee Yes  . Chronic pain of left knee   . Morbid obesity due to excess calories [facility])     PLAN Call if any problems.  Precautions discussed.  Continue current medications.   Return to clinic 3 months   I have reviewed the [place] web site prior to prescribing narcotic medicine for this patient.  Electronically Signed Sanjuana Kava, MD 7/17/20183:06 PM

## 2017-04-19 ENCOUNTER — Telehealth: Payer: Self-pay | Admitting: Orthopaedic Surgery

## 2017-04-19 MED ORDER — OXYCODONE HCL 10 MG PO TABS: ORAL_TABLET | ORAL | 0 refills | Status: DC

## 2017-04-19 NOTE — Telephone Encounter (Signed)
Patient requests refill on Oxycodone HCI 10 mgs.    Qty  150    Sig: One every four to six hours as need for pain. Must last 30 days. Do not drive a car or operate machinery when taking this medicine.

## 2017-05-17 MED ORDER — OXYCODONE HCL 10 MG PO TABS: ORAL_TABLET | ORAL | 0 refills | Status: DC

## 2017-05-17 NOTE — Telephone Encounter (Signed)
Patient called today requesting his prescription to be refilled.

## 2017-06-10 ENCOUNTER — Encounter (HOSPITAL_COMMUNITY): Payer: Self-pay | Admitting: Emergency Medicine

## 2017-06-10 ENCOUNTER — Emergency Department (HOSPITAL_COMMUNITY): Payer: Self-pay

## 2017-06-10 ENCOUNTER — Emergency Department (HOSPITAL_COMMUNITY)
Admission: EM | Admit: 2017-06-10 | Discharge: 2017-06-10 | Disposition: A | Discharge status: Home / Self Care | Payer: Self-pay | Attending: Emergency Medicine | Admitting: Emergency Medicine

## 2017-06-10 DIAGNOSIS — I10 Essential (primary) hypertension: Secondary | ICD-10-CM | POA: Insufficient documentation

## 2017-06-10 DIAGNOSIS — R05 Cough: Secondary | ICD-10-CM | POA: Insufficient documentation

## 2017-06-10 DIAGNOSIS — Z79899 Other long term (current) drug therapy: Secondary | ICD-10-CM | POA: Insufficient documentation

## 2017-06-10 DIAGNOSIS — E119 Type 2 diabetes mellitus without complications: Secondary | ICD-10-CM | POA: Insufficient documentation

## 2017-06-10 DIAGNOSIS — R059 Cough, unspecified: Secondary | ICD-10-CM

## 2017-06-10 LAB — COMPREHENSIVE METABOLIC PANEL
ALBUMIN: 2.9 g/dL — AB (ref 3.5–5.0)
ALK PHOS: 150 U/L — AB (ref 38–126)
ALT: 32 U/L (ref 17–63)
AST: 23 U/L (ref 15–41)
Anion gap: 9 (ref 5–15)
BILIRUBIN TOTAL: 0.9 mg/dL (ref 0.3–1.2)
BUN: 5 mg/dL — AB (ref 6–20)
CALCIUM: 8.7 mg/dL — AB (ref 8.9–10.3)
CO2: 25 mmol/L (ref 22–32)
Chloride: 97 mmol/L — ABNORMAL LOW (ref 101–111)
Creatinine, Ser: 0.81 mg/dL (ref 0.61–1.24)
GFR calc Af Amer: >60 mL/min (ref >60)
GLUCOSE: 349 mg/dL — AB (ref 65–99)
POTASSIUM: 4.1 mmol/L (ref 3.5–5.1)
Sodium: 131 mmol/L — ABNORMAL LOW (ref 135–145)
TOTAL PROTEIN: 7.7 g/dL (ref 6.5–8.1)

## 2017-06-10 LAB — URINALYSIS, ROUTINE W REFLEX MICROSCOPIC
BACTERIA UA: NONE SEEN
Bilirubin Urine: NEGATIVE
Glucose, UA: >=500 mg/dL — AB
KETONES UR: NEGATIVE mg/dL
Leukocytes, UA: NEGATIVE
Nitrite: NEGATIVE
PH: 5 (ref 5.0–8.0)
Protein, ur: 30 mg/dL — AB
SPECIFIC GRAVITY, URINE: 1.023 (ref 1.005–1.030)

## 2017-06-10 LAB — CBC WITH DIFFERENTIAL/PLATELET
BASOS ABS: 0 10*3/uL (ref 0.0–0.1)
BASOS PCT: 0 %
EOS PCT: 0 %
Eosinophils Absolute: 0 10*3/uL (ref 0.0–0.7)
HEMATOCRIT: 42.5 % (ref 39.0–52.0)
Hemoglobin: 13.8 g/dL (ref 13.0–17.0)
Lymphocytes Relative: 11 %
Lymphs Abs: 1.9 10*3/uL (ref 0.7–4.0)
MCH: 27.3 pg (ref 26.0–34.0)
MCHC: 32.5 g/dL (ref 30.0–36.0)
MCV: 84.2 fL (ref 78.0–100.0)
MONOS PCT: 14 %
Monocytes Absolute: 2.4 10*3/uL — ABNORMAL HIGH (ref 0.1–1.0)
NEUTROS ABS: 12.8 10*3/uL — AB (ref 1.7–7.7)
NEUTROS PCT: 75 %
PLATELETS: 261 10*3/uL (ref 150–400)
RBC: 5.05 MIL/uL (ref 4.22–5.81)
RDW: 14.1 % (ref 11.5–15.5)
WBC: 17.1 10*3/uL — AB (ref 4.0–10.5)

## 2017-06-10 LAB — CBG MONITORING, ED: Glucose-Capillary: 289 mg/dL — ABNORMAL HIGH (ref 65–99)

## 2017-06-10 MED ORDER — DOXYCYCLINE HYCLATE 100 MG PO CAPS: 100.0000 mg | ORAL_CAPSULE | Freq: Two times a day (BID) | ORAL | 0 refills | Status: DC

## 2017-06-10 MED ORDER — TRAMADOL HCL 50 MG PO TABS: 50.0000 mg | ORAL_TABLET | Freq: Four times a day (QID) | ORAL | 0 refills | Status: DC | PRN

## 2017-06-10 MED ORDER — ONDANSETRON HCL 4 MG/2ML IJ SOLN
4.0000 mg | Freq: Once | INTRAMUSCULAR | Status: AC
  Administered 2017-06-10: 4 mg via INTRAVENOUS
  Filled 2017-06-10: qty 2

## 2017-06-10 MED ORDER — SODIUM CHLORIDE 0.9 % IV BOLUS (SEPSIS)
1000.0000 mL | Freq: Once | INTRAVENOUS | Status: AC
  Administered 2017-06-10: 1000 mL via INTRAVENOUS

## 2017-06-10 MED ORDER — KETOROLAC TROMETHAMINE 30 MG/ML IJ SOLN
30.0000 mg | Freq: Once | INTRAMUSCULAR | Status: AC
  Administered 2017-06-10: 30 mg via INTRAVENOUS
  Filled 2017-06-10: qty 1

## 2017-06-10 NOTE — ED Triage Notes (Addendum)
Patient c/o cough with body aches and fevers x3 days. Per patient productive cough with thick clear sputum. Patient states short of breath with cough and exertion. Patient reports "coughing so hard he is vomiting." Denies any chest or abd pain.

## 2017-06-10 NOTE — ED Provider Notes (Signed)
[facility] DEPT Provider Note   CSN: [id number] Arrival date & time: 06/10/17  1417     History   Chief Complaint Chief Complaint  Patient presents with  . Cough    HPI [name] is a 51 y.o. male.  Patient complains of cough congestion fevers and aches   The history is provided by the patient.  Cough  This is a new problem. The problem occurs constantly. The cough is non-productive. The maximum temperature recorded prior to his arrival was 100 to 100.9 F. Associated symptoms include chills. Pertinent negatives include no chest pain and no headaches. He has tried nothing for the symptoms. The treatment provided no relief.    Past Medical History:  Diagnosis Date  . Arthritis   . Diabetes mellitus without complication ([place])   . Gout   . Hypertension    boarderline    Patient Active Problem List   Diagnosis Date Noted  . Right knee pain 01/08/2016  . Left knee pain 01/08/2016  . Morbid obesity due to excess calories ([place]) 01/08/2016    Past Surgical History:  Procedure Laterality Date  . HERNIA REPAIR         Home Medications    Prior to Admission medications   Medication Sig Start Date End Date Taking? Authorizing Provider  ibuprofen (ADVIL,MOTRIN) 200 MG tablet Take 200 mg by mouth every 6 (six) hours as needed for mild pain or moderate pain.   Yes [provider]  doxycycline (VIBRAMYCIN) 100 MG capsule Take 1 capsule (100 mg total) by mouth 2 (two) times daily. One po bid x 7 days 06/10/17   Milton Ferguson, MD  traMADol (ULTRAM) 50 MG tablet Take 1 tablet (50 mg total) by mouth every 6 (six) hours as needed. 06/10/17   Milton Ferguson, MD    Family History No family history on file.  Social History Social History  Substance Use Topics  . Smoking status: Never Smoker  . Smokeless tobacco: Never Used  . Alcohol use No     Allergies   Patient has no known allergies.   Review of Systems Review of Systems  Constitutional: Positive  for chills. Negative for appetite change and fatigue.  HENT: Negative for congestion, ear discharge and sinus pressure.   Eyes: Negative for discharge.  Respiratory: Positive for cough.   Cardiovascular: Negative for chest pain.  Gastrointestinal: Negative for abdominal pain and diarrhea.  Genitourinary: Negative for frequency and hematuria.  Musculoskeletal: Negative for back pain.  Skin: Negative for rash.  Neurological: Negative for seizures and headaches.  Psychiatric/Behavioral: Negative for hallucinations.     Physical Exam Updated Vital Signs BP 117/83 (BP Location: Left Wrist)   Pulse (!) 106   Temp 99.8 F (37.7 C) (Oral)   Resp (!) 24   Ht 5\' 8"  (1.727 m)   Wt (!) 165.6 kg (365 lb)   SpO2 98%   BMI 55.50 kg/m   Physical Exam  Constitutional: He is oriented to person, place, and time. He appears well-developed.  HENT:  Head: Normocephalic.  Eyes: Conjunctivae and EOM are normal. No scleral icterus.  Neck: Neck supple. No thyromegaly present.  Cardiovascular: Normal rate and regular rhythm.  Exam reveals no gallop and no friction rub.   No murmur heard. Pulmonary/Chest: No stridor. He has no wheezes. He has no rales. He exhibits no tenderness.  Abdominal: He exhibits no distension. There is no tenderness. There is no rebound.  Musculoskeletal: Normal range of motion. He exhibits no edema.  Lymphadenopathy:    He has no cervical adenopathy.  Neurological: He is oriented to person, place, and time. He exhibits normal muscle tone. Coordination normal.  Skin: No rash noted. No erythema.  Psychiatric: He has a normal mood and affect. His behavior is normal.     ED Treatments / Results  Labs (all labs ordered are listed, but only abnormal results are displayed) Labs Reviewed  CBC WITH DIFFERENTIAL/PLATELET - Abnormal; Notable for the following:       Result Value   WBC 17.1 (*)    Neutro Abs 12.8 (*)    Monocytes Absolute 2.4 (*)    All other components within  normal limits  COMPREHENSIVE METABOLIC PANEL - Abnormal; Notable for the following:    Sodium 131 (*)    Chloride 97 (*)    Glucose, Bld 349 (*)    BUN 5 (*)    Calcium 8.7 (*)    Albumin 2.9 (*)    Alkaline Phosphatase 150 (*)    All other components within normal limits  URINALYSIS, ROUTINE W REFLEX MICROSCOPIC - Abnormal; Notable for the following:    Color, Urine AMBER (*)    Glucose, UA >=500 (*)    Hgb urine dipstick SMALL (*)    Protein, ur 30 (*)    Squamous Epithelial / LPF 0-5 (*)    All other components within normal limits  CBG MONITORING, ED - Abnormal; Notable for the following:    Glucose-Capillary 289 (*)    All other components within normal limits    EKG  EKG Interpretation None       Radiology Dg Chest 2 View  Result Date: 06/10/2017 CLINICAL DATA:  Cough and congestion. EXAM: CHEST  2 VIEW COMPARISON:  Two-view chest x-ray 12/03/2015 FINDINGS: The heart size is exaggerated by low lung volumes. The visualized soft tissues and bony thorax are unremarkable. The lungs are poorly inflated on the lateral view. The visualized soft tissues and bony thorax are on remarkable. IMPRESSION: 1. Low lung volumes. 2. No acute cardiopulmonary disease peer Electronically Signed   By: San Morelle M.D.   On: 06/10/2017 14:52    Procedures Procedures (including critical care time)  Medications Ordered in ED Medications  sodium chloride 0.9 % bolus 1,000 mL (1,000 mLs Intravenous New Bag/Given 06/10/17 1725)  ondansetron (ZOFRAN) injection 4 mg (4 mg Intravenous Given 06/10/17 1725)  ketorolac (TORADOL) 30 MG/ML injection 30 mg (30 mg Intravenous Given 06/10/17 1725)     Initial Impression / Assessment and Plan / ED Course  I have reviewed the triage vital signs and the nursing notes.  Pertinent labs & imaging results that were available during my care of the patient were reviewed by me and considered in my medical decision making (see chart for details).      Patient with upper respiratory infection. Patient also dehydrated with poorly controlled diabetes. He was placed on doxycycline and Ultram will drink plenty of fluids rest follow-up with PCP  Final Clinical Impressions(s) / ED Diagnoses   Final diagnoses:  Cough    New Prescriptions New Prescriptions   DOXYCYCLINE (VIBRAMYCIN) 100 MG CAPSULE    Take 1 capsule (100 mg total) by mouth 2 (two) times daily. One po bid x 7 days   TRAMADOL (ULTRAM) 50 MG TABLET    Take 1 tablet (50 mg total) by mouth every 6 (six) hours as needed.     Milton Ferguson, MD 06/10/17 [phone]

## 2017-06-10 NOTE — Discharge Instructions (Signed)
Drink plenty of fluids. Take Tylenol for fever. Follow-up with your family doctor next week not improving

## 2017-06-15 ENCOUNTER — Encounter: Payer: Self-pay | Admitting: Orthopaedic Surgery

## 2017-06-15 ENCOUNTER — Ambulatory Visit (INDEPENDENT_AMBULATORY_CARE_PROVIDER_SITE_OTHER): Payer: Self-pay | Admitting: Orthopaedic Surgery

## 2017-06-15 VITALS — BP 137/89 | HR 88 | Temp 97.6°F

## 2017-06-15 DIAGNOSIS — G8929 Other chronic pain: Secondary | ICD-10-CM

## 2017-06-15 DIAGNOSIS — M25562 Pain in left knee: Secondary | ICD-10-CM

## 2017-06-15 DIAGNOSIS — M25561 Pain in right knee: Secondary | ICD-10-CM

## 2017-06-15 MED ORDER — NAPROXEN 500 MG PO TABS: 500.0000 mg | ORAL_TABLET | Freq: Two times a day (BID) | ORAL | 5 refills | Status: DC

## 2017-06-15 MED ORDER — OXYCODONE-ACETAMINOPHEN 10-325 MG PO TABS: 1.0000 | ORAL_TABLET | ORAL | 0 refills | Status: DC | PRN

## 2017-06-15 NOTE — Addendum Note (Signed)
Addended by: Willette Pa on: 06/15/2017 04:28 PM   Modules accepted: Orders

## 2017-06-15 NOTE — Progress Notes (Signed)
Patient [name], male DOB:Oct 04, 1965, 51 y.o. FAO:[id number]  Chief Complaint  Patient presents with  . Follow-up    bilateral knee pain    HPI  [name] is a 51 y.o. male who has chronic bilateral knee pain.  He has no giving way.  He has swelling and popping and more pain in the right knee.  He has no trauma.  He is active.  He is not losing weight. HPI  There is no height or weight on file to calculate BMI.  ROS  Review of Systems  HENT: Negative for congestion.   Respiratory: Positive for shortness of breath. Negative for cough.   Cardiovascular: Positive for leg swelling. Negative for chest pain.  Endocrine: Negative for cold intolerance.  Musculoskeletal: Positive for arthralgias, back pain, gait problem, joint swelling and myalgias.  Allergic/Immunologic: Positive for environmental allergies.  Neurological: Negative for numbness.    Past Medical History:  Diagnosis Date  . Arthritis   . Diabetes mellitus without complication ([place])   . Gout   . Hypertension    boarderline    Past Surgical History:  Procedure Laterality Date  . HERNIA REPAIR      No family history on file.  Social History Social History  Substance Use Topics  . Smoking status: Never Smoker  . Smokeless tobacco: Never Used  . Alcohol use No    No Known Allergies  Current Outpatient Prescriptions  Medication Sig Dispense Refill  . doxycycline (VIBRAMYCIN) 100 MG capsule Take 1 capsule (100 mg total) by mouth 2 (two) times daily. One po bid x 7 days 14 capsule 0  . ibuprofen (ADVIL,MOTRIN) 200 MG tablet Take 200 mg by mouth every 6 (six) hours as needed for mild pain or moderate pain.    [name] oxyCODONE-acetaminophen (PERCOCET) 10-325 MG tablet Take 1 tablet by mouth every 4 (four) hours as needed for pain. 150 tablet 0   No current facility-administered medications for this visit.      Physical Exam  Blood pressure 137/89, pulse 88, temperature 97.6 F (36.4  C).  Constitutional: overall normal hygiene, normal nutrition, well developed, normal grooming, normal body habitus. Assistive device:none  Musculoskeletal: gait and station Limp right, muscle tone and strength are normal, no tremors or atrophy is present.  .  Neurological: coordination overall normal.  Deep tendon reflex/nerve stretch intact.  Sensation normal.  Cranial nerves II-XII intact.   Skin:   Normal overall no scars, lesions, ulcers or rashes. No psoriasis.  Psychiatric: Alert and oriented x 3.  Recent memory intact, remote memory unclear.  Normal mood and affect. Well groomed.  Good eye contact.  Cardiovascular: overall no swelling, no varicosities, no edema bilaterally, normal temperatures of the legs and arms, no clubbing, cyanosis and good capillary refill.  Lymphatic: palpation is normal.  All other systems reviewed and are negative   Both knees have effusion of 1+.  Right knee ROM is 0 to 100 and the left is 0 to 110.  Crepitus is present in both knees.  Both knees have medial joint line pain. Both are stable.  NV intact.  The patient has been educated about the nature of the problem(s) and counseled on treatment options.  The patient appeared to understand what I have discussed and is in agreement with it.  Encounter Diagnoses  Name Primary?  . Chronic pain of right knee Yes  . Chronic pain of left knee   . Morbid obesity due to excess calories ([place])  PLAN Call if any problems.  Precautions discussed.  Continue current medications.   Return to clinic 3 months   I have reviewed the [place] web site prior to prescribing narcotic medicine for this patient.  Electronically Signed Sanjuana Kava, MD 10/10/20184:24 PM

## 2017-06-21 ENCOUNTER — Ambulatory Visit: Payer: Self-pay | Admitting: Orthopaedic Surgery

## 2017-06-22 DIAGNOSIS — Z139 Encounter for screening, unspecified: Secondary | ICD-10-CM

## 2017-06-22 LAB — POCT URINALYSIS DIPSTICK
GLUCOSE UA: 100
Ketones, UA: NEGATIVE
Leukocytes, UA: NEGATIVE
Nitrite, UA: NEGATIVE
Protein, UA: 100
SPEC GRAV UA: 1.025 (ref 1.010–1.025)
Urobilinogen, UA: 2 E.U./dL — AB
pH, UA: 6 (ref 5.0–8.0)

## 2017-06-22 LAB — POCT GLYCOSYLATED HEMOGLOBIN (HGB A1C): Hemoglobin A1C: 13.4

## 2017-06-22 LAB — GLUCOSE, POCT (MANUAL RESULT ENTRY): POC GLUCOSE: 251 mg/dL — AB (ref 70–99)

## 2017-06-23 NOTE — Congregational Nurse Program (Signed)
Congregational Nurse Program Note  Date of Encounter: 06/22/2017  Past Medical History: Past Medical History:  Diagnosis Date  . Arthritis   . Diabetes mellitus without complication ([place])   . Gout   . Hypertension    boarderline    Encounter Details:     CNP Questionnaire - 06/22/17 1430      Patient Demographics   Is this a new or existing patient? New   Patient is considered a/an Not Applicable   Race African-American/Black     Patient Assistance   Location of Patient [place]   Patient's financial/insurance status Low Income;Self-Pay (Uninsured)   Uninsured Patient ([place]) Yes   Interventions Counseled to make appt. with provider;Assisted patient in making appt.   Patient referred to apply for the following financial assistance [place] insecurities addressed Referred to food bank or resource   Transportation assistance No   Assistance securing medications No   Educational health offerings Hypertension;Navigating the healthcare system;Diabetes;Chronic disease;Nutrition     Encounter Details   Primary purpose of visit Chronic Illness/Condition Visit;Education/Health Concerns;Navigating the Healthcare System   Was an Emergency Department visit averted? Not Applicable   Does patient have a medical provider? No   Patient referred to Area Agency;Clinic;Establish PCP   Was a mental health screening completed? (GAINS tool) No   Does patient have dental issues? No   Does patient have vision issues? Yes   Was a vision referral made? No   Does your patient have an abnormal blood pressure today? Yes   Since previous encounter, have you referred patient for abnormal blood pressure that resulted in a new diagnosis or medication change? No   Does your patient have an abnormal blood glucose today? Yes   Since previous encounter, have you referred patient for abnormal blood glucose that resulted in a new diagnosis or  medication change? No   Was there a life-saving intervention made? No     New Client to [employer] today. Client seeking assistance with navigation into a primary care provider. Client currently has no source of income and has no health insurance. He has a brother that lives with him that helps with the bills and also a sister that helps out occasionally with financial needs. Client is currently paying out of pocket to see Dr. Luna Glasgow for arthritis and bilateral knee issues. He has been receiving injections there as well as medications for pain as needed. Client was previously a patient of Dr Iona Beard in [place], but states he has not seen him since March and has not had diabetes medications nor antihypertensives in about 9 months.   Past Medical History: Arthritis Obesity Diabetes Hypertension ? Cholesterol elevation  Current Medications: OxyContin 10 mg/325 mg 1 tab every 4 hours as needed.   Alert and oriented to person and place, answers questions appropriately. Complains of bilateral knee pain. Client states he has been on an unknown blood pressure medication and is "borderline diabetic" complains of blurry vision, dark colored urine, excessive thirst and frequent night time urination. Client states he was recently treated for upper respiratory infection in [place] and was treated with antibiotics. Today lungs are clear, pulse rapid and regular 112, respirations 12 saturations 99%. Random glucose 251, A1C performed in office 13.4%. RN asked client about his diet and activity, he states he drinks regular sodas, not a lot of water, and states he eats several small meals a day. Client states he was supposed  to see Jearld Fenton for diabetes education and was not able to attend due to loss of income and insurance. RN educated client on cutting out all sugary beverages and sodas and following a more nutritious diet, such as increasing green vegetables, lean baked or grilled meats,  decreasing amounts of breads, pastas and rice. Talked to client about what considered as borderline diabetes as far as A1C and where he is and how uncontrolled diabetes affects his organs, heart, eyes, kidneys, circulation. Client states understanding. Client complained of "orange urine" no pain or burning. Dipstick urine performed: Urine dark amber, glucose 100 mg/dl, Bil moderate, Ketones negative, sp gravity 1.025, blood small amt, pH 6.0, Protein 100 mg/dl, Uro 2.0 E.U./dl, Nitrates Negative, LEU Negative. Discussed with client importance of drinking water 6 to 8 ounces of water 4 to 6 times a day, avoid caffeine an sugary drinks and the reasons increased sugar affect hydration. Handouts given on diabetes, blood pressure, healthy eating and my plate. Client denies suicidal thoughts and no prior thoughts or attempts. Discussed with client regarding options for care and that any narcotic pain medications will need to continue through his orthopedist. He states understanding.  Client referred into the [facility] and appointment secured for 06/28/17 at 11:00 am.   Referral into [facility] [facility] diabetic nutrition classes. Sheet given with dates and times.  Referral given to food pantrys in area with names and locations  Reviewed again, importance of stopping intake of sugary beverages, increasing water intake as well as a balanced diet, handouts given.   Will follow up with client after initial primary care visit and as needed.

## 2017-06-28 ENCOUNTER — Emergency Department (HOSPITAL_COMMUNITY): Payer: Self-pay

## 2017-06-28 ENCOUNTER — Encounter: Payer: Self-pay | Admitting: Physician Assistant

## 2017-06-28 ENCOUNTER — Other Ambulatory Visit (HOSPITAL_COMMUNITY)
Admission: RE | Admit: 2017-06-28 | Discharge: 2017-06-28 | Disposition: A | Discharge status: Home / Self Care | Payer: Self-pay | Source: Ambulatory Visit | Attending: Physician Assistant | Admitting: Physician Assistant

## 2017-06-28 ENCOUNTER — Encounter (HOSPITAL_COMMUNITY): Payer: Self-pay | Admitting: *Deleted

## 2017-06-28 ENCOUNTER — Inpatient Hospital Stay (HOSPITAL_COMMUNITY)
Admission: EM | Admit: 2017-06-28 | Discharge: 2017-07-13 | DRG: 871 | Disposition: A | Discharge status: Home Health | Payer: Self-pay | Attending: Family Medicine | Admitting: Family Medicine

## 2017-06-28 ENCOUNTER — Ambulatory Visit (HOSPITAL_COMMUNITY)
Admission: RE | Admit: 2017-06-28 | Discharge: 2017-06-28 | Disposition: A | Discharge status: Home / Self Care | Payer: Self-pay | Source: Ambulatory Visit | Attending: Physician Assistant | Admitting: Physician Assistant

## 2017-06-28 ENCOUNTER — Ambulatory Visit: Payer: Self-pay | Admitting: Physician Assistant

## 2017-06-28 VITALS — BP 136/70 | HR 90 | Temp 96.7°F | Ht 65.5 in | Wt 375.8 lb

## 2017-06-28 DIAGNOSIS — Z833 Family history of diabetes mellitus: Secondary | ICD-10-CM

## 2017-06-28 DIAGNOSIS — K521 Toxic gastroenteritis and colitis: Secondary | ICD-10-CM | POA: Diagnosis not present

## 2017-06-28 DIAGNOSIS — I10 Essential (primary) hypertension: Secondary | ICD-10-CM

## 2017-06-28 DIAGNOSIS — I878 Other specified disorders of veins: Secondary | ICD-10-CM | POA: Insufficient documentation

## 2017-06-28 DIAGNOSIS — R05 Cough: Secondary | ICD-10-CM | POA: Insufficient documentation

## 2017-06-28 DIAGNOSIS — E1165 Type 2 diabetes mellitus with hyperglycemia: Secondary | ICD-10-CM

## 2017-06-28 DIAGNOSIS — N3090 Cystitis, unspecified without hematuria: Secondary | ICD-10-CM

## 2017-06-28 DIAGNOSIS — T3695XA Adverse effect of unspecified systemic antibiotic, initial encounter: Secondary | ICD-10-CM | POA: Diagnosis not present

## 2017-06-28 DIAGNOSIS — A408 Other streptococcal sepsis: Principal | ICD-10-CM | POA: Diagnosis present

## 2017-06-28 DIAGNOSIS — R945 Abnormal results of liver function studies: Secondary | ICD-10-CM | POA: Diagnosis present

## 2017-06-28 DIAGNOSIS — Z8249 Family history of ischemic heart disease and other diseases of the circulatory system: Secondary | ICD-10-CM

## 2017-06-28 DIAGNOSIS — A419 Sepsis, unspecified organism: Secondary | ICD-10-CM

## 2017-06-28 DIAGNOSIS — R42 Dizziness and giddiness: Secondary | ICD-10-CM

## 2017-06-28 DIAGNOSIS — I5189 Other ill-defined heart diseases: Secondary | ICD-10-CM

## 2017-06-28 DIAGNOSIS — Z6841 Body Mass Index (BMI) 40.0 and over, adult: Secondary | ICD-10-CM | POA: Insufficient documentation

## 2017-06-28 DIAGNOSIS — I517 Cardiomegaly: Secondary | ICD-10-CM | POA: Insufficient documentation

## 2017-06-28 DIAGNOSIS — J9811 Atelectasis: Secondary | ICD-10-CM | POA: Insufficient documentation

## 2017-06-28 DIAGNOSIS — R5383 Other fatigue: Secondary | ICD-10-CM

## 2017-06-28 DIAGNOSIS — G8918 Other acute postprocedural pain: Secondary | ICD-10-CM

## 2017-06-28 DIAGNOSIS — K439 Ventral hernia without obstruction or gangrene: Secondary | ICD-10-CM | POA: Diagnosis present

## 2017-06-28 DIAGNOSIS — R188 Other ascites: Secondary | ICD-10-CM

## 2017-06-28 DIAGNOSIS — Z23 Encounter for immunization: Secondary | ICD-10-CM

## 2017-06-28 DIAGNOSIS — Z87891 Personal history of nicotine dependence: Secondary | ICD-10-CM

## 2017-06-28 DIAGNOSIS — K750 Abscess of liver: Secondary | ICD-10-CM

## 2017-06-28 DIAGNOSIS — D509 Iron deficiency anemia, unspecified: Secondary | ICD-10-CM | POA: Diagnosis present

## 2017-06-28 DIAGNOSIS — T383X6A Underdosing of insulin and oral hypoglycemic [antidiabetic] drugs, initial encounter: Secondary | ICD-10-CM | POA: Diagnosis present

## 2017-06-28 DIAGNOSIS — Z87898 Personal history of other specified conditions: Secondary | ICD-10-CM

## 2017-06-28 DIAGNOSIS — D62 Acute posthemorrhagic anemia: Secondary | ICD-10-CM

## 2017-06-28 DIAGNOSIS — M109 Gout, unspecified: Secondary | ICD-10-CM | POA: Diagnosis present

## 2017-06-28 DIAGNOSIS — R059 Cough, unspecified: Secondary | ICD-10-CM

## 2017-06-28 DIAGNOSIS — E785 Hyperlipidemia, unspecified: Secondary | ICD-10-CM

## 2017-06-28 DIAGNOSIS — R7881 Bacteremia: Secondary | ICD-10-CM

## 2017-06-28 DIAGNOSIS — K82A1 Gangrene of gallbladder in cholecystitis: Secondary | ICD-10-CM | POA: Diagnosis present

## 2017-06-28 DIAGNOSIS — Z91128 Patient's intentional underdosing of medication regimen for other reason: Secondary | ICD-10-CM

## 2017-06-28 DIAGNOSIS — L0291 Cutaneous abscess, unspecified: Secondary | ICD-10-CM

## 2017-06-28 DIAGNOSIS — K810 Acute cholecystitis: Secondary | ICD-10-CM

## 2017-06-28 DIAGNOSIS — Z8739 Personal history of other diseases of the musculoskeletal system and connective tissue: Secondary | ICD-10-CM

## 2017-06-28 DIAGNOSIS — Z1629 Resistance to other single specified antibiotic: Secondary | ICD-10-CM | POA: Diagnosis present

## 2017-06-28 DIAGNOSIS — E119 Type 2 diabetes mellitus without complications: Secondary | ICD-10-CM

## 2017-06-28 DIAGNOSIS — Y92009 Unspecified place in unspecified non-institutional (private) residence as the place of occurrence of the external cause: Secondary | ICD-10-CM

## 2017-06-28 DIAGNOSIS — D72829 Elevated white blood cell count, unspecified: Secondary | ICD-10-CM

## 2017-06-28 DIAGNOSIS — R197 Diarrhea, unspecified: Secondary | ICD-10-CM

## 2017-06-28 LAB — COMPREHENSIVE METABOLIC PANEL
ALK PHOS: 474 U/L — AB (ref 38–126)
ALT: 73 U/L — AB (ref 17–63)
AST: 115 U/L — AB (ref 15–41)
Albumin: 2.2 g/dL — ABNORMAL LOW (ref 3.5–5.0)
Anion gap: 11 (ref 5–15)
BUN: 7 mg/dL (ref 6–20)
CALCIUM: 8.9 mg/dL (ref 8.9–10.3)
CO2: 26 mmol/L (ref 22–32)
CREATININE: 0.71 mg/dL (ref 0.61–1.24)
Chloride: 101 mmol/L (ref 101–111)
GFR calc Af Amer: >60 mL/min (ref >60)
GFR calc non Af Amer: >60 mL/min (ref >60)
GLUCOSE: 239 mg/dL — AB (ref 65–99)
Potassium: 4.2 mmol/L (ref 3.5–5.1)
SODIUM: 138 mmol/L (ref 135–145)
Total Bilirubin: 3.3 mg/dL — ABNORMAL HIGH (ref 0.3–1.2)
Total Protein: 7.9 g/dL (ref 6.5–8.1)

## 2017-06-28 LAB — LIPID PANEL
Cholesterol: 154 mg/dL (ref 0–200)
HDL: 10 mg/dL — ABNORMAL LOW (ref >40)
LDL CALC: 96 mg/dL (ref 0–99)
Total CHOL/HDL Ratio: 15.4 RATIO
Triglycerides: 238 mg/dL — ABNORMAL HIGH (ref <150)
VLDL: 48 mg/dL — AB (ref 0–40)

## 2017-06-28 LAB — POCT URINALYSIS DIPSTICK
Ketones, UA: 15
Leukocytes, UA: NEGATIVE
NITRITE UA: POSITIVE
PH UA: 5.5 (ref 5.0–8.0)
Protein, UA: 100
Spec Grav, UA: 1.02 (ref 1.010–1.025)
UROBILINOGEN UA: 2 U/dL — AB

## 2017-06-28 LAB — CBC WITH DIFFERENTIAL/PLATELET
BASOS PCT: 0 %
Basophils Absolute: 0 10*3/uL (ref 0.0–0.1)
EOS ABS: 0 10*3/uL (ref 0.0–0.7)
Eosinophils Relative: 0 %
HCT: 36.8 % — ABNORMAL LOW (ref 39.0–52.0)
HEMOGLOBIN: 11.9 g/dL — AB (ref 13.0–17.0)
LYMPHS ABS: 2.6 10*3/uL (ref 0.7–4.0)
Lymphocytes Relative: 12 %
MCH: 26.7 pg (ref 26.0–34.0)
MCHC: 32.3 g/dL (ref 30.0–36.0)
MCV: 82.5 fL (ref 78.0–100.0)
Monocytes Absolute: 2.5 10*3/uL — ABNORMAL HIGH (ref 0.1–1.0)
Monocytes Relative: 11 %
NEUTROS PCT: 77 %
Neutro Abs: 16.7 10*3/uL — ABNORMAL HIGH (ref 1.7–7.7)
Platelets: 491 10*3/uL — ABNORMAL HIGH (ref 150–400)
RBC: 4.46 MIL/uL (ref 4.22–5.81)
RDW: 14.4 % (ref 11.5–15.5)
WBC: 21.9 10*3/uL — AB (ref 4.0–10.5)

## 2017-06-28 LAB — INFLUENZA PANEL BY PCR (TYPE A & B)
Influenza A By PCR: NEGATIVE
Influenza B By PCR: NEGATIVE

## 2017-06-28 LAB — URINALYSIS, ROUTINE W REFLEX MICROSCOPIC
Glucose, UA: 50 mg/dL — AB
Hgb urine dipstick: NEGATIVE
Ketones, ur: NEGATIVE mg/dL
Leukocytes, UA: NEGATIVE
Nitrite: NEGATIVE
Protein, ur: 30 mg/dL — AB
Specific Gravity, Urine: 1.04 — ABNORMAL HIGH (ref 1.005–1.030)
Squamous Epithelial / LPF: NONE SEEN
pH: 6 (ref 5.0–8.0)

## 2017-06-28 LAB — LACTIC ACID, PLASMA
Lactic Acid, Venous: 1.4 mmol/L (ref 0.5–1.9)
Lactic Acid, Venous: 1.5 mmol/L (ref 0.5–1.9)

## 2017-06-28 LAB — CBG MONITORING, ED: Glucose-Capillary: 214 mg/dL — ABNORMAL HIGH (ref 65–99)

## 2017-06-28 LAB — GLUCOSE, POCT (MANUAL RESULT ENTRY): POC Glucose: 260 mg/dl — AB (ref 70–99)

## 2017-06-28 LAB — TSH: TSH: 0.532 u[IU]/mL (ref 0.350–4.500)

## 2017-06-28 LAB — LIPASE, BLOOD: Lipase: 20 U/L (ref 11–51)

## 2017-06-28 MED ORDER — PIPERACILLIN-TAZOBACTAM 3.375 G IVPB 30 MIN
3.3750 g | Freq: Once | INTRAVENOUS | Status: AC
Start: 2017-06-28 — End: 2017-06-28
  Administered 2017-06-28: 3.375 g via INTRAVENOUS
  Filled 2017-06-28: qty 50

## 2017-06-28 MED ORDER — ONDANSETRON HCL 4 MG/2ML IJ SOLN
4.0000 mg | Freq: Once | INTRAMUSCULAR | Status: AC
  Administered 2017-06-28: 4 mg via INTRAVENOUS
  Filled 2017-06-28: qty 2

## 2017-06-28 MED ORDER — METFORMIN HCL 1000 MG PO TABS: 1000.0000 mg | ORAL_TABLET | Freq: Two times a day (BID) | ORAL | 3 refills | Status: DC

## 2017-06-28 MED ORDER — CEPHALEXIN 500 MG PO CAPS: 500.0000 mg | ORAL_CAPSULE | Freq: Four times a day (QID) | ORAL | 0 refills | Status: DC

## 2017-06-28 MED ORDER — IOPAMIDOL (ISOVUE-300) INJECTION 61%
120.0000 mL | Freq: Once | INTRAVENOUS | Status: AC | PRN
  Administered 2017-06-28: 120 mL via INTRAVENOUS

## 2017-06-28 MED ORDER — SODIUM CHLORIDE 0.9 % IV SOLN: INTRAVENOUS | Status: DC

## 2017-06-28 MED ORDER — ALBUTEROL SULFATE HFA 108 (90 BASE) MCG/ACT IN AERS
2.0000 | INHALATION_SPRAY | Freq: Four times a day (QID) | RESPIRATORY_TRACT | 0 refills | Status: DC | PRN
Start: 2017-06-28 — End: 2017-07-21

## 2017-06-28 MED ORDER — KETOROLAC TROMETHAMINE 30 MG/ML IJ SOLN
15.0000 mg | Freq: Once | INTRAMUSCULAR | Status: AC
  Administered 2017-06-28: 15 mg via INTRAVENOUS
  Filled 2017-06-28: qty 1

## 2017-06-28 MED ORDER — METRONIDAZOLE IN NACL 5-0.79 MG/ML-% IV SOLN
500.0000 mg | Freq: Once | INTRAVENOUS | Status: AC
  Administered 2017-06-28: 500 mg via INTRAVENOUS
  Filled 2017-06-28: qty 100

## 2017-06-28 MED ORDER — BENZONATATE 100 MG PO CAPS: ORAL_CAPSULE | ORAL | 3 refills | Status: DC

## 2017-06-28 MED ORDER — HYDROMORPHONE HCL 1 MG/ML IJ SOLN
0.7500 mg | Freq: Once | INTRAMUSCULAR | Status: AC
  Administered 2017-06-28: 0.75 mg via INTRAVENOUS
  Filled 2017-06-28: qty 1

## 2017-06-28 MED ORDER — SODIUM CHLORIDE 0.9 % IV BOLUS (SEPSIS)
1000.0000 mL | Freq: Once | INTRAVENOUS | Status: AC
  Administered 2017-06-28: 1000 mL via INTRAVENOUS

## 2017-06-28 NOTE — ED Notes (Signed)
MD at bedside. 

## 2017-06-28 NOTE — ED Triage Notes (Signed)
Pt sent here from PCP. Pt unable to state why, just instructed to come to ED ASAP. Pt denies any pain.

## 2017-06-28 NOTE — ED Notes (Signed)
Pt stated that he is very cold and lethargic.

## 2017-06-28 NOTE — ED Notes (Signed)
Per bed control, no stepdown bed available now at [facility].

## 2017-06-28 NOTE — Progress Notes (Signed)
BP 136/70 (BP Location: Left Arm, Patient Position: Sitting, Cuff Size: Normal)   Pulse 90   Temp (!) 96.7 F (35.9 C) (Oral)   Ht 5' 5.5" (1.664 m)   Wt (!) 375 lb 12 oz (170.4 kg)   SpO2 97%   BMI 61.58 kg/m    Subjective:    Patient ID: [name], male    DOB: 15-Aug-1966, 51 y.o.   MRN: [id number]  HPI: [name] is a 51 y.o. male presenting on 06/28/2017 for New Patient (Initial Visit) (last seen PCP since Feb 2018. pt went to Dr. Berdine Addison [facility] in [place])   HPI Chief Complaint  Patient presents with  . New Patient (Initial Visit)    last seen PCP since Feb 2018. pt went to Dr. Berdine Addison [facility] in [place] lost his job and his insurance earlier this year.   Pt worked [employer].   Pt feeling poorly recently- Lightheaded, fatigue, wheezing.  He denies cp. He also complains of Sweating, "vomiting" post-tussive emesis for about a week. He went to ER 06/10/17.  He says it was for same symptoms.  He got antibiotics- doxycycline.  He felt like he was getting better by the time he finished his abx but then started getting worse again  Emesis after coughing, always  Pt has required inhaler in the past, but hasn't been on one regularly.  In the ER on 10/5- WBC was 17.1  Pt never checks his bs at home.   He Was on janumet in the past  Relevant past medical, surgical, family and social history reviewed and updated as indicated. Interim medical history since our last visit reviewed. Allergies and medications reviewed and updated.   Current Outpatient Prescriptions:  .  oxyCODONE-acetaminophen (PERCOCET) 10-325 MG tablet, Take 1 tablet by mouth every 4 (four) hours as needed for pain., Disp: 150 tablet, Rfl: 0   Review of Systems  Constitutional: Positive for appetite change, diaphoresis, fatigue and unexpected weight change.  Respiratory: Positive for cough and wheezing.   Cardiovascular: Negative for chest pain and leg swelling.   Gastrointestinal: Positive for abdominal pain, constipation and vomiting. Negative for blood in stool and diarrhea.  Genitourinary: Negative for dysuria.  Psychiatric/Behavioral: Negative for dysphoric mood and suicidal ideas. The patient is not nervous/anxious.     Per HPI unless specifically indicated above     Objective:    BP 136/70 (BP Location: Left Arm, Patient Position: Sitting, Cuff Size: Normal)   Pulse 90   Temp (!) 96.7 F (35.9 C) (Oral)   Ht 5' 5.5" (1.664 m)   Wt (!) 375 lb 12 oz (170.4 kg)   SpO2 97%   BMI 61.58 kg/m   Wt Readings from Last 3 Encounters:  06/28/17 (!) 375 lb 12 oz (170.4 kg)  06/22/17 (!) 374 lb 3.2 oz (169.7 kg)  06/10/17 (!) 365 lb (165.6 kg)    Physical Exam  Constitutional: He is oriented to person, place, and time. He appears well-developed and well-nourished.  HENT:  Head: Normocephalic and atraumatic.  Right Ear: A foreign body is present.  Left Ear: A foreign body is present.  Mouth/Throat: Oropharynx is clear and moist. No oropharyngeal exudate.  Cerumen B  Eyes: Pupils are equal, round, and reactive to light. EOM are normal.  Jaundiced B  Neck: Neck supple. No thyromegaly present.  Cardiovascular: Normal rate and regular rhythm.   Pulmonary/Chest: Effort normal. No tachypnea and no bradypnea. No  respiratory distress. He has no decreased breath sounds. He has wheezes. He has no rhonchi. He has no rales.  Abdominal: Soft. Bowel sounds are normal. He exhibits no mass. There is no hepatosplenomegaly. There is no tenderness.  Musculoskeletal: He exhibits edema (trace BLE).  Difficulty moving around, getting onto exam table  Lymphadenopathy:    He has no cervical adenopathy.  Neurological: He is alert and oriented to person, place, and time.  Skin: Skin is warm and dry. No rash noted.  Psychiatric: He has a normal mood and affect. His behavior is normal. Thought content normal.  Vitals reviewed.    fbs -260 UA -tea-like, +  nit      Assessment & Plan:    Encounter Diagnoses  Name Primary?  [name] Uncontrolled type 2 diabetes mellitus with hyperglycemia ([place]) Yes  . Essential hypertension   . Hyperlipidemia, unspecified hyperlipidemia type   . Cough   . Light headedness   . Fatigue, unspecified type   . Morbid obesity ([place])   . BMI 60.0-69.9, adult ([place])   . Cystitis     -Labs today -CXR today -rx keflex (for urine and lungs) -rx tessalon, metformin, albuterol MDI -pt was given [facility] discount application -pt counseled to Drink more water -pt to follow up 2 weeks.  RTO sooner prn any worsening or new symptoms

## 2017-06-28 NOTE — ED Notes (Signed)
Attempted to draw bloodwork,but unsuccessful IV attempt. Pt states clinic PA told him to come to the ED because he had a life threatening virus. Have closed doors to room

## 2017-06-28 NOTE — ED Notes (Signed)
Pt sleeping peacefully at this time.

## 2017-06-28 NOTE — ED Notes (Signed)
hospitalist Provider at bedside. 

## 2017-06-28 NOTE — H&P (Signed)
TRH H&P    Patient Demographics:    [name], is a 51 y.o. male  MRN: [id number]  DOB - 1966-03-11  Admit Date - 06/28/2017  Referring MD/NP/PA: *Dr. Wilson Singer  Outpatient Primary MD for the patient is [name], PA-C  Patient coming from: *home  Chief Complaint  Patient presents with  . Abnormal Lab      HPI:    [name]  is a 51 y.o. male, with history of diabetes mellitus, gout, hypertensionwho was referred to ER from PCP clinic. He was seen as a new patient in the clinic following up from ER visit on 06/10/2017. At that time, he was diagnosed with upper respiratory infection and started on doxycycline. Patient symptoms improved somewhat after which started again worsening. Patient was complaining of fatigue, chills, sweating. Patient also reports 30 pound unintentional weight loss over last several weeks to months. Patient complained of abdominal pain. He denies chest pain,  no shortness of breath. No nausea vomiting or diarrhea. No history of stroke or TIA.  In the ED, CT scan of the abdomen and pelvis was done which showed multiple intrahepaticlesions, likely liver abscesses. Also showed fluid collection around the gallbladder, with thickening of the gallbladder wall concerning for cholecystitis. Gen. Surgery at [facility] was consulted by ED physician, Dr Molli Posey recommended that patient be transferred to [facility].    Review of systems:      All other systems reviewed and are negative.   With Past History of the following :    Past Medical History:  Diagnosis Date  . Arthritis   . Diabetes mellitus without complication ([place])    diagnosed about age 40  . Gout   . Hypertension    boarderline      Past Surgical History:  Procedure Laterality Date  . HERNIA REPAIR  2008  . MASS EXCISION Left age 43   L thigh- benign      Social History:      Social History    Substance Use Topics  . Smoking status: Former Smoker    Packs/day: 0.50    Years: 5.00    Types: Cigarettes    Quit date: 55  . Smokeless tobacco: Never Used  . Alcohol use No       Family History :     Family History  Problem Relation Age of Onset  . Cancer Mother   . Diabetes Mother   . Heart failure Father   . Hypertension Father   . Diabetes Father       Home Medications:   Prior to Admission medications   Medication Sig Start Date End Date Taking? Authorizing Provider  oxyCODONE-acetaminophen (PERCOCET) 10-325 MG tablet Take 1 tablet by mouth every 4 (four) hours as needed for pain. 06/15/17  Yes Sanjuana Kava, MD  albuterol (PROVENTIL HFA;VENTOLIN HFA) 108 (90 Base) MCG/ACT inhaler Inhale 2 puffs into the lungs every 6 (six) hours as needed for wheezing or shortness of breath. 06/28/17   [name], PA-C  benzonatate (TESSALON PERLES) 100 MG  capsule 1-2 po q 8 hour prn cough Patient taking differently: Take 100-200 mg by mouth 3 (three) times daily as needed for cough. 1-2 po q 8 hour prn cough 06/28/17   [name], PA-C  cephALEXin (KEFLEX) 500 MG capsule Take 1 capsule (500 mg total) by mouth 4 (four) times daily. 06/28/17   [name], PA-C  metFORMIN (GLUCOPHAGE) 1000 MG tablet Take 1 tablet (1,000 mg total) by mouth 2 (two) times daily with a meal. 06/28/17   [name], PA-C     Allergies:    No Known Allergies   Physical Exam:   Vitals  Blood pressure (!) 97/55, pulse 94, temperature 98.5 F (36.9 C), temperature source Oral, resp. rate 20, height 5\' 7"  (1.702 m), weight (!) 170.1 kg (375 lb), SpO2 97 %.  1.  General: Appears in no acute distress  2. Psychiatric:  Intact judgement and  insight, awake alert, oriented x 3.  3. Neurologic: No focal neurological deficits, all cranial nerves intact.Strength 5/5 all 4 extremities, sensation intact all 4 extremities, plantars down going.  4. Eyes :  anicteric sclerae, moist  conjunctivae with no lid lag. PERRLA.  5. ENMT:  Oropharynx clear with moist mucous membranes and good dentition  6. Neck:  supple, no cervical lymphadenopathy appriciated, No thyromegaly  7. Respiratory : Normal respiratory effort, good air movement bilaterally,clear to  auscultation bilaterally  8. Cardiovascular : RRR, no gallops, rubs or murmurs, no leg edema  9. Gastrointestinal:  Positive bowel sounds, abdomen soft, non-tender to palpation,no hepatosplenomegaly, no rigidity or guarding       10. Skin:  No cyanosis, normal texture and turgor, no rash, lesions or ulcers  11.Musculoskeletal:  Good muscle tone,  joints appear normal , no effusions,  normal range of motion    Data Review:    CBC  Recent Labs Lab 06/28/17 1232  WBC 21.9*  HGB 11.9*  HCT 36.8*  PLT 491*  MCV 82.5  MCH 26.7  MCHC 32.3  RDW 14.4  LYMPHSABS 2.6  MONOABS 2.5*  EOSABS 0.0  BASOSABS 0.0   ------------------------------------------------------------------------------------------------------------------  Chemistries   Recent Labs Lab 06/28/17 1232  NA 138  K 4.2  CL 101  CO2 26  GLUCOSE 239*  BUN 7  CREATININE 0.71  CALCIUM 8.9  AST 115*  ALT 73*  ALKPHOS 474*  BILITOT 3.3*   ------------------------------------------------------------------------------------------------------------------  ------------------------------------------------------------------------------------------------------------------ GFR: Estimated Creatinine Clearance: 164.5 mL/min (by C-G formula based on SCr of 0.71 mg/dL). Liver Function Tests:  Recent Labs Lab 06/28/17 1232  AST 115*  ALT 73*  ALKPHOS 474*  BILITOT 3.3*  PROT 7.9  ALBUMIN 2.2*    Recent Labs Lab 06/28/17 1849  LIPASE 20   No results for input(s): AMMONIA in the last 168 hours. Coagulation Profile: No results for input(s): INR, PROTIME in the last 168 hours. Cardiac Enzymes: No results for input(s): CKTOTAL,  CKMB, CKMBINDEX, TROPONINI in the last 168 hours. BNP (last 3 results) No results for input(s): PROBNP in the last 8760 hours. HbA1C: No results for input(s): HGBA1C in the last 72 hours. CBG:  Recent Labs Lab 06/28/17 2210  GLUCAP 214*   Lipid Profile:  Recent Labs  06/28/17 1232  CHOL 154  HDL 10*  LDLCALC 96  TRIG 238*  CHOLHDL 15.4   Thyroid Function Tests:  Recent Labs  06/28/17 1232  TSH 0.532   Anemia Panel: No results for input(s): VITAMINB12, FOLATE, FERRITIN, TIBC, IRON, RETICCTPCT in the last 72 hours.  --------------------------------------------------------------------------------------------------------------- Urine  analysis:    Component Value Date/Time   COLORURINE AMBER (A) 06/28/2017 1828   APPEARANCEUR CLEAR 06/28/2017 1828   LABSPEC 1.040 (H) 06/28/2017 1828   PHURINE 6.0 06/28/2017 1828   GLUCOSEU 50 (A) 06/28/2017 1828   HGBUR NEGATIVE 06/28/2017 1828   BILIRUBINUR SMALL (A) 06/28/2017 1828   BILIRUBINUR LARGE 06/28/2017 1155   KETONESUR NEGATIVE 06/28/2017 1828   PROTEINUR 30 (A) 06/28/2017 1828   UROBILINOGEN 2.0 (A) 06/28/2017 1155   NITRITE NEGATIVE 06/28/2017 1828   LEUKOCYTESUR NEGATIVE 06/28/2017 1828      Imaging Results:    Dg Chest 2 View  Result Date: 06/28/2017 CLINICAL DATA:  Shortness of breath.  Cough . EXAM: CHEST  2 VIEW COMPARISON:  06/10/2017. FINDINGS: Mediastinum hilar structures normal. Cardiomegaly with mild pulmonary vascular congestion. No overt pulmonary edema. Mild left base subsegmental atelectasis. No prominent pleural effusion. Pleural thickening noted bilaterally unchanged consistent scarring . IMPRESSION: 1. Cardiomegaly with mild pulmonary venous congestion. 2. Mild left base subsegmental atelectasis. Electronically Signed   By: Marcello Moores  Register   On: 06/28/2017 14:29   Ct Abdomen Pelvis W Contrast  Result Date: 06/28/2017 CLINICAL DATA:  Initial evaluation for acute abdominal infection, right upper  quadrant tenderness. EXAM: CT ABDOMEN AND PELVIS WITH CONTRAST TECHNIQUE: Multidetector CT imaging of the abdomen and pelvis was performed using the standard protocol following bolus administration of intravenous contrast. CONTRAST:  171mL ISOVUE-300 IOPAMIDOL (ISOVUE-300) INJECTION 61% COMPARISON:  Prior ultrasound 08/31/2016. FINDINGS: Lower chest: Scattered bibasilar atelectatic changes. Visualized lungs otherwise clear. Hepatobiliary: Multiple ill-defined hypodense lesions seen within the liver, predominantly involving the right hepatic lobe. These are predominantly subcapsular in location. For reference purposes, a discrete lesion positioned within the right hepatic lobe measures approximately 4.0 x 3.7 cm in size (series 2, image 40). Associated bulging of the hepatic contour at a few areas with prominent perihepatic inflammatory stranding. Findings favored to reflect intrahepatic abscess ease, although metastatic disease could also be considered. Additionally, inflammatory stranding seen about the gallbladder in the gallbladder fossa. The gallbladder itself appears to be partially contracted. There is an adjacent oblong collection/lesion that measures approximately 8.2 x 3.5 x 5.8 cm (series 2, image 42), positioned adjacent to the gallbladder. This appears to be contiguous with several of the intrahepatic lesions (series 6, image 51, 46, 43). Finding also favored to reflect abscess, although mass or adenopathy could be considered as well. Hazy inflammatory stranding extends inferiorly and laterally about the liver. No appreciable biliary dilatation. Pancreas: Pancreas within normal limits. Spleen: Spleen within normal limits. Adrenals/Urinary Tract: Adrenal glands are normal. Kidneys equal in size with symmetric enhancement. Probable punctate nonobstructive left renal nephrolithiasis noted. No hydronephrosis or focal enhancing renal mass. No hydroureter. Partially distended bladder within normal limits.  Stomach/Bowel: Stomach within normal limits. Hazy inflammatory stranding about the second- third portion of the duodenum related to the inflammatory process about the gallbladder an liver. No evidence for bowel obstruction. Appendix is normal. No other acute inflammatory changes about the bowels. Specifically, no or significant inflammatory changes about the bowels seen to serve as a source for intrahepatic seeding. Vascular/Lymphatic: Normal intravascular enhancement seen throughout the intra-abdominal aorta and its branch vessels. No adenopathy. Reproductive: Prostate normal. Other: No free intraperitoneal air. No free fluid. Sequelae of prior ventral hernia repair noted. There is a recurrent mildly complex fat containing ventral hernia at the lower margin of the mesh. Loop of small bowel closely approximates the hernia neck. Musculoskeletal: No acute osseus abnormality. No worrisome lytic or blastic  osseous lesions. IMPRESSION: 1. Multiple abnormal intrahepatic lesions as above with adjacent perihepatic inflammatory stranding. Given the adjacent inflammatory changes, findings favored to reflect sequelae of infection with intrahepatic abscesses. Exact source not definitively identified on this exam, as no significant inflammatory changes are seen about the bowels themselves. Metastatic disease could also be considered, although is perhaps less favored. Correlation with percutaneous sampling would likely be helpful for further evaluation. 2. Additional 8.2 x 3.5 x 5.8 cm collection/lesion adjacent to the gallbladder as above, favored to be contiguous with a few of the peripheral intrahepatic lesions. Finding also favored to reflect abscess. The adjacent gallbladder is contracted with hazy inflammatory stranding in the gallbladder fossa. Under clear whether these inflammatory changes are related to adjacent infection/abscess or possibly concomitant cholecystitis. No biliary dilatation identified. 3. Sequelae of  prior hernia repair with recurrent fat containing ventral hernia. Electronically Signed   By: Jeannine Boga M.D.   On: 06/28/2017 20:39       Assessment & Plan:    Active Problems:   Hepatic abscess   Liver abscess   1. Liver abscess- CT scan shows multiple liver abscesses, unclear etiology. Could be secondary to biliary infection. Patient will transfer to [place] surgery was consulted by ED physician. Will start patient on Flagyl as well as [place] consultation.Blood cultures 2 have been obtained. 2. ? Cholecystitis- CT scan also shows thickening of the gallbladder with fluid collection around the gallbladder, concerning for abscess. Lab shows elevated alkaline phosphatase, AST and ALT. General surgery has been consulted as above. Continue IV antibiotics. Will keep patient nothing by mouth. Dilaudid 1 mg IV every 4 hours when necessary for pain 3. UTI- patient has ab normal UA,urine culture has been obtained. Continue IV antibiotics as above. 4. Diabetes mellitus-will keep patient nothing by mouth nothing by mouth, check CBG every 6 hours. Initiate sliding scale insulin with NovoLog.   DVT Prophylaxis-   Lovenox   AM Labs Ordered, also please review Full Orders  Family Communication: Admission, patients condition and plan of care including tests being ordered have been discussed with the patient  who indicate understanding and agree with the plan and Code Status.  Code Status:  Full code  Admission status: inpatient  Time spent in minutes : 55 minutes   Coty Larsh S M.D on 06/28/2017 at 11:43 PM  Between 7am to 7pm - Pager - [phone]. After 7pm go to [email or web address] - password [facility]  [facility] - Office  [phone]

## 2017-06-28 NOTE — ED Notes (Signed)
Per hospitalist, pt can have ice chips

## 2017-06-28 NOTE — ED Notes (Signed)
Checked on pt for urine sample,pt can't go right now,will try back in 30 minutes. 

## 2017-06-28 NOTE — ED Notes (Signed)
Patient transported to CT 

## 2017-06-28 NOTE — ED Provider Notes (Signed)
[facility] EMERGENCY DEPARTMENT Provider Note   CSN: [id number] Arrival date & time: 06/28/17  1522     History   Chief Complaint Chief Complaint  Patient presents with  . Abnormal Lab    HPI [name] is a 51 y.o. male.  HPI   51 year old male referred to the emergency room after having abnormal blood work earlier today.  He was seen as a new patient in clinic following up from an ER evaluation on 10/5.  He had URI type symptoms at that time and was started on doxycycline.  He reports symptoms improved somewhat while being on antibiotics but worsening again once finished.  He is primarily complaining of a cough.  On review of systems, he reports approximately 30 pound unintentional weight loss over the last several weeks to month.  He attributes this to anorexia and nausea.  Some episodes of posttussive emesis.  Diffuse abdominal pain, somewhat worse around the umbilicus.  States that this is been intermittent for the past several weeks as well.  In the past day he has developed some mid to upper back pain.  No respiratory complaints aside from the cough.  No fever but repeatedly stating that he is very cold and requesting more blankets. "If you could imagine yourself naked in [place], that's how I feel."   Occasionally drinks beer, otherwise denies past history of significant alcohol use.  Denies past history of drug use.  Labs from earlier today were reviewed.  Significant for a leukocytosis of 21,000 with ANC of 16.7.  Mild anemia with a hemoglobin of 11.9.  LFTs were abnormal.  AST 115.  ALT 73.  Alk phos 474.  Total bili 3.3.  Albumin 2.2.  Glucose 239.  Urine dip significant for large bilirubin, large glucose, small ketones, elevated urobilinogen and nitrite positive.  Past Medical History:  Diagnosis Date  . Arthritis   . Diabetes mellitus without complication ([place])    diagnosed about age 30  . Gout   . Hypertension    board[place]line    Patient Active Problem List   Diagnosis Date Noted  . Right knee pain 01/08/2016  . Left knee pain 01/08/2016  . Morbid obesity due to excess calories ([place]) 01/08/2016    Past Surgical History:  Procedure Laterality Date  . HERNIA REPAIR  2008  . MASS EXCISION Left age 4   L thigh- benign       Home Medications    Prior to Admission medications   Medication Sig Start Date End Date Taking? Authorizing Provider  oxyCODONE-acetaminophen (PERCOCET) 10-325 MG tablet Take 1 tablet by mouth every 4 (four) hours as needed for pain. 06/15/17  Yes Sanjuana Kava, MD  albuterol (PROVENTIL HFA;VENTOLIN HFA) 108 (90 Base) MCG/ACT inhaler Inhale 2 puffs into the lungs every 6 (six) hours as needed for wheezing or shortness of breath. 06/28/17   [name], PA-C  benzonatate (TESSALON PERLES) 100 MG capsule 1-2 po q 8 hour prn cough Patient taking differently: Take 100-200 mg by mouth 3 (three) times daily as needed for cough. 1-2 po q 8 hour prn cough 06/28/17   [name], PA-C  cephALEXin (KEFLEX) 500 MG capsule Take 1 capsule (500 mg total) by mouth 4 (four) times daily. 06/28/17   [name], PA-C  metFORMIN (GLUCOPHAGE) 1000 MG tablet Take 1 tablet (1,000 mg total) by mouth 2 (two) times daily with a meal. 06/28/17   [name], PA-C    Family History Family History  Problem Relation  Age of Onset  . Cancer Mother   . Diabetes Mother   . Heart failure Father   . Hypertension Father   . Diabetes Father     Social History Social History  Substance Use Topics  . Smoking status: Former Smoker    Packs/day: 0.50    Years: 5.00    Types: Cigarettes    Quit date: 19  . Smokeless tobacco: Never Used  . Alcohol use No     Allergies   Patient has no known allergies.   Review of Systems Review of Systems  All systems reviewed and negative, other than as noted in HPI.  Physical Exam Updated Vital Signs BP (!) 157/89 (BP Location: Left Arm)   Pulse (!) 106   Temp 98.2 F (36.8  C) (Oral)   Resp 19   Ht 5' 7"  (1.702 m)   Wt (!) 170.1 kg (375 lb)   SpO2 97%   BMI 58.73 kg/m   Physical Exam  Constitutional:  Laying in bed. Appears uncomfortable.   HENT:  Head: Normocephalic and atraumatic.  Eyes: Conjunctivae are normal. Right eye exhibits no discharge. Left eye exhibits no discharge.  Neck: Neck supple.  Cardiovascular: Regular rhythm and normal heart sounds.  Exam reveals no gallop and no friction rub.   No murmur heard. Mild tachycardia  Pulmonary/Chest: Effort normal. No respiratory distress.  Decreased b/l  Abdominal: Soft. He exhibits no distension. There is tenderness.  Diffuse tenderness. Focally worse in RUQ with voluntary guarding.   Musculoskeletal: He exhibits no edema or tenderness.  Neurological: He is alert.  Skin: Skin is warm and dry.  Psychiatric: He has a normal mood and affect. His behavior is normal. Thought content normal.  Nursing note and vitals reviewed.    ED Treatments / Results  Labs (all labs ordered are listed, but only abnormal results are displayed) Labs Reviewed  URINALYSIS, ROUTINE W REFLEX MICROSCOPIC - Abnormal; Notable for the following:       Result Value   Color, Urine AMBER (*)    Specific Gravity, Urine 1.040 (*)    Glucose, UA 50 (*)    Bilirubin Urine SMALL (*)    Protein, ur 30 (*)    Bacteria, UA RARE (*)    All other components within normal limits  CULTURE, BLOOD (ROUTINE X 2)  CULTURE, BLOOD (ROUTINE X 2)  URINE CULTURE  LACTIC ACID, PLASMA  LACTIC ACID, PLASMA  LIPASE, BLOOD  INFLUENZA PANEL BY PCR (TYPE A & B)  HEPATITIS PANEL, ACUTE    EKG  EKG Interpretation None       Radiology Dg Chest 2 View  Result Date: 06/28/2017 CLINICAL DATA:  Shortness of breath.  Cough . EXAM: CHEST  2 VIEW COMPARISON:  06/10/2017. FINDINGS: Mediastinum hilar structures normal. Cardiomegaly with mild pulmonary vascular congestion. No overt pulmonary edema. Mild left base subsegmental atelectasis. No  prominent pleural effusion. Pleural thickening noted bilaterally unchanged consistent scarring . IMPRESSION: 1. Cardiomegaly with mild pulmonary venous congestion. 2. Mild left base subsegmental atelectasis. Electronically Signed   By: Marcello Moores  Register   On: 06/28/2017 14:29   Ct Abdomen Pelvis W Contrast  Result Date: 06/28/2017 CLINICAL DATA:  Initial evaluation for acute abdominal infection, right upper quadrant tenderness. EXAM: CT ABDOMEN AND PELVIS WITH CONTRAST TECHNIQUE: Multidetector CT imaging of the abdomen and pelvis was performed using the standard protocol following bolus administration of intravenous contrast. CONTRAST:  19m ISOVUE-300 IOPAMIDOL (ISOVUE-300) INJECTION 61% COMPARISON:  Prior ultrasound 08/31/2016. FINDINGS: Lower  chest: Scattered bibasilar atelectatic changes. Visualized lungs otherwise clear. Hepatobiliary: Multiple ill-defined hypodense lesions seen within the liver, predominantly involving the right hepatic lobe. These are predominantly subcapsular in location. For reference purposes, a discrete lesion positioned within the right hepatic lobe measures approximately 4.0 x 3.7 cm in size (series 2, image 40). Associated bulging of the hepatic contour at a few areas with prominent perihepatic inflammatory stranding. Findings favored to reflect intrahepatic abscess ease, although metastatic disease could also be considered. Additionally, inflammatory stranding seen about the gallbladder in the gallbladder fossa. The gallbladder itself appears to be partially contracted. There is an adjacent oblong collection/lesion that measures approximately 8.2 x 3.5 x 5.8 cm (series 2, image 42), positioned adjacent to the gallbladder. This appears to be contiguous with several of the intrahepatic lesions (series 6, image 51, 46, 43). Finding also favored to reflect abscess, although mass or adenopathy could be considered as well. Hazy inflammatory stranding extends inferiorly and laterally  about the liver. No appreciable biliary dilatation. Pancreas: Pancreas within normal limits. Spleen: Spleen within normal limits. Adrenals/Urinary Tract: Adrenal glands are normal. Kidneys equal in size with symmetric enhancement. Probable punctate nonobstructive left renal nephrolithiasis noted. No hydronephrosis or focal enhancing renal mass. No hydroureter. Partially distended bladder within normal limits. Stomach/Bowel: Stomach within normal limits. Hazy inflammatory stranding about the second- third portion of the duodenum related to the inflammatory process about the gallbladder an liver. No evidence for bowel obstruction. Appendix is normal. No other acute inflammatory changes about the bowels. Specifically, no or significant inflammatory changes about the bowels seen to serve as a source for intrahepatic seeding. Vascular/Lymphatic: Normal intravascular enhancement seen throughout the intra-abdominal aorta and its branch vessels. No adenopathy. Reproductive: Prostate normal. Other: No free intraperitoneal air. No free fluid. Sequelae of prior ventral hernia repair noted. There is a recurrent mildly complex fat containing ventral hernia at the lower margin of the mesh. Loop of small bowel closely approximates the hernia neck. Musculoskeletal: No acute osseus abnormality. No worrisome lytic or blastic osseous lesions. IMPRESSION: 1. Multiple abnormal intrahepatic lesions as above with adjacent perihepatic inflammatory stranding. Given the adjacent inflammatory changes, findings favored to reflect sequelae of infection with intrahepatic abscesses. Exact source not definitively identified on this exam, as no significant inflammatory changes are seen about the bowels themselves. Metastatic disease could also be considered, although is perhaps less favored. Correlation with percutaneous sampling would likely be helpful for further evaluation. 2. Additional 8.2 x 3.5 x 5.8 cm collection/lesion adjacent to the  gallbladder as above, favored to be contiguous with a few of the peripheral intrahepatic lesions. Finding also favored to reflect abscess. The adjacent gallbladder is contracted with hazy inflammatory stranding in the gallbladder fossa. Under clear whether these inflammatory changes are related to adjacent infection/abscess or possibly concomitant cholecystitis. No biliary dilatation identified. 3. Sequelae of prior hernia repair with recurrent fat containing ventral hernia. Electronically Signed   By: Jeannine Boga M.D.   On: 06/28/2017 20:39    Procedures Procedures (including critical care time)  Medications Ordered in ED Medications - No data to display   Initial Impression / Assessment and Plan / ED Course  I have reviewed the triage vital signs and the nursing notes.  Pertinent labs & imaging results that were available during my care of the patient were reviewed by me and considered in my medical decision making (see chart for details).     51 year old male with a constellation of symptoms and labs which are concerning to  me.  He has diffuse abdominal tenderness on exam.  It is somewhat limited by his body habitus, but he does seem focally more tender in his right upper quadrant.  He is afebrile but repeatedly telling me that he feels cold and has a white count of greater than 20,000.  Consider cholecystitis, cholangitis, acute hepatitis, etc. Needs abdominal imaging. [place] not available and body habitus may make difficult anyways. Will CT if scanner can accomodate him.  9:10 PM Imaging as above. Back/shoulder pain likely from same process. Referred from diaphragmic irritation? Discussed with Dr Arnoldo Morale, general surgery. He feels that the scope of care he will require is beyond capabilities at [facility]. Discussed with Dr Georgette Dover, general surgery, at [facility]. Will see in consultation. Admission to hospitalist service. Transfer. Pt updated on CT findings, need for hospitalization and  probably IR drainage sometime tomorrow.    Final Clinical Impressions(s) / ED Diagnoses   Final diagnoses:  Sepsis, due to unspecified organism [facility])  Abscess, hepatic    New Prescriptions New Prescriptions   No medications on file     Virgel Manifold, MD 06/28/17 2128

## 2017-06-29 ENCOUNTER — Telehealth: Payer: Self-pay

## 2017-06-29 DIAGNOSIS — E1165 Type 2 diabetes mellitus with hyperglycemia: Secondary | ICD-10-CM

## 2017-06-29 DIAGNOSIS — E119 Type 2 diabetes mellitus without complications: Secondary | ICD-10-CM

## 2017-06-29 DIAGNOSIS — K810 Acute cholecystitis: Secondary | ICD-10-CM

## 2017-06-29 LAB — CBC
HEMATOCRIT: 33.4 % — AB (ref 39.0–52.0)
HEMOGLOBIN: 10.6 g/dL — AB (ref 13.0–17.0)
MCH: 25.9 pg — ABNORMAL LOW (ref 26.0–34.0)
MCHC: 31.7 g/dL (ref 30.0–36.0)
MCV: 81.5 fL (ref 78.0–100.0)
Platelets: 487 10*3/uL — ABNORMAL HIGH (ref 150–400)
RBC: 4.1 MIL/uL — AB (ref 4.22–5.81)
RDW: 14.9 % (ref 11.5–15.5)
WBC: 17.7 10*3/uL — AB (ref 4.0–10.5)

## 2017-06-29 LAB — GLUCOSE, CAPILLARY
GLUCOSE-CAPILLARY: 155 mg/dL — AB (ref 65–99)
GLUCOSE-CAPILLARY: 159 mg/dL — AB (ref 65–99)
GLUCOSE-CAPILLARY: 224 mg/dL — AB (ref 65–99)
Glucose-Capillary: 155 mg/dL — ABNORMAL HIGH (ref 65–99)
Glucose-Capillary: 231 mg/dL — ABNORMAL HIGH (ref 65–99)

## 2017-06-29 LAB — MRSA PCR SCREENING: MRSA by PCR: NEGATIVE

## 2017-06-29 LAB — MICROALBUMIN, URINE: Microalb, Ur: 26.8 ug/mL — ABNORMAL HIGH

## 2017-06-29 LAB — HEMOGLOBIN A1C
HEMOGLOBIN A1C: 13.4 % — AB (ref 4.8–5.6)
Mean Plasma Glucose: 338 mg/dL

## 2017-06-29 LAB — CREATININE, SERUM
Creatinine, Ser: 0.69 mg/dL (ref 0.61–1.24)
GFR calc non Af Amer: >60 mL/min (ref >60)

## 2017-06-29 MED ORDER — INFLUENZA VAC SPLIT QUAD 0.5 ML IM SUSY
0.5000 mL | PREFILLED_SYRINGE | INTRAMUSCULAR | Status: AC
  Administered 2017-06-30: 0.5 mL via INTRAMUSCULAR
  Filled 2017-06-29: qty 0.5

## 2017-06-29 MED ORDER — METRONIDAZOLE IN NACL 5-0.79 MG/ML-% IV SOLN
500.0000 mg | Freq: Three times a day (TID) | INTRAVENOUS | Status: DC
  Administered 2017-06-29: 500 mg via INTRAVENOUS
  Filled 2017-06-29: qty 100

## 2017-06-29 MED ORDER — SODIUM CHLORIDE 0.9 % IV SOLN
INTRAVENOUS | Status: DC
  Administered 2017-06-29: via INTRAVENOUS
  Administered 2017-07-01: 50 mL/h via INTRAVENOUS

## 2017-06-29 MED ORDER — HYDROMORPHONE HCL 1 MG/ML IJ SOLN
1.0000 mg | INTRAMUSCULAR | Status: DC | PRN
  Administered 2017-06-29 – 2017-07-05 (×26): 1 mg via INTRAVENOUS
  Filled 2017-06-29 (×27): qty 1

## 2017-06-29 MED ORDER — VANCOMYCIN HCL 10 G IV SOLR
1500.0000 mg | Freq: Two times a day (BID) | INTRAVENOUS | Status: DC
  Administered 2017-06-30: 1500 mg via INTRAVENOUS
  Filled 2017-06-29 (×2): qty 1500

## 2017-06-29 MED ORDER — ACETAMINOPHEN 325 MG PO TABS: 650.0000 mg | ORAL_TABLET | Freq: Four times a day (QID) | ORAL | Status: DC | PRN

## 2017-06-29 MED ORDER — LIVING WELL WITH DIABETES BOOK
Freq: Once | Status: AC
  Administered 2017-06-29: 17:00:00
  Filled 2017-06-29: qty 1

## 2017-06-29 MED ORDER — ENOXAPARIN SODIUM 40 MG/0.4ML ~~LOC~~ SOLN: 40.0000 mg | SUBCUTANEOUS | Status: DC

## 2017-06-29 MED ORDER — ENOXAPARIN SODIUM 40 MG/0.4ML ~~LOC~~ SOLN
40.0000 mg | SUBCUTANEOUS | Status: DC
  Administered 2017-06-29: 40 mg via SUBCUTANEOUS
  Filled 2017-06-29: qty 0.4

## 2017-06-29 MED ORDER — VANCOMYCIN HCL 10 G IV SOLR
2500.0000 mg | Freq: Once | INTRAVENOUS | Status: AC
  Administered 2017-06-29: 2500 mg via INTRAVENOUS
  Filled 2017-06-29 (×2): qty 2500

## 2017-06-29 MED ORDER — ACETAMINOPHEN 650 MG RE SUPP: 650.0000 mg | Freq: Four times a day (QID) | RECTAL | Status: DC | PRN

## 2017-06-29 MED ORDER — PIPERACILLIN-TAZOBACTAM 3.375 G IVPB
3.3750 g | Freq: Three times a day (TID) | INTRAVENOUS | Status: DC
  Administered 2017-06-29 – 2017-06-30 (×5): 3.375 g via INTRAVENOUS
  Filled 2017-06-29 (×6): qty 50

## 2017-06-29 MED ORDER — INSULIN ASPART 100 UNIT/ML ~~LOC~~ SOLN
0.0000 [IU] | Freq: Three times a day (TID) | SUBCUTANEOUS | Status: DC
  Administered 2017-06-29: 3 [IU] via SUBCUTANEOUS
  Administered 2017-06-29 – 2017-06-30 (×4): 2 [IU] via SUBCUTANEOUS
  Administered 2017-06-30 – 2017-07-01 (×3): 3 [IU] via SUBCUTANEOUS
  Administered 2017-07-01: 2 [IU] via SUBCUTANEOUS
  Administered 2017-07-02: 5 [IU] via SUBCUTANEOUS
  Administered 2017-07-02: 3 [IU] via SUBCUTANEOUS
  Administered 2017-07-02 – 2017-07-03 (×3): 2 [IU] via SUBCUTANEOUS
  Administered 2017-07-03: 3 [IU] via SUBCUTANEOUS
  Administered 2017-07-04 (×3): 2 [IU] via SUBCUTANEOUS
  Administered 2017-07-05: 1 [IU] via SUBCUTANEOUS
  Administered 2017-07-05 – 2017-07-06 (×3): 2 [IU] via SUBCUTANEOUS
  Administered 2017-07-06 – 2017-07-07 (×3): 1 [IU] via SUBCUTANEOUS
  Administered 2017-07-07: 2 [IU] via SUBCUTANEOUS
  Administered 2017-07-08: 1 [IU] via SUBCUTANEOUS
  Administered 2017-07-08 – 2017-07-09 (×2): 2 [IU] via SUBCUTANEOUS
  Administered 2017-07-09 (×2): 1 [IU] via SUBCUTANEOUS
  Administered 2017-07-10: 2 [IU] via SUBCUTANEOUS
  Administered 2017-07-10: 1 [IU] via SUBCUTANEOUS
  Administered 2017-07-11: 2 [IU] via SUBCUTANEOUS
  Administered 2017-07-11 – 2017-07-12 (×2): 1 [IU] via SUBCUTANEOUS
  Administered 2017-07-13: 2 [IU] via SUBCUTANEOUS
  Administered 2017-07-13: 1 [IU] via SUBCUTANEOUS

## 2017-06-29 NOTE — Consult Note (Signed)
[facility] Surgery Consult Note  [name] September 14, 1965  [id number].    Requesting MD: Carolin Sicks Chief Complaint/Reason for Consult: Abdominal abscesses HPI:  Patient is a 51 y/o male who presented to [facility] ED after being referred by PCP. Patient reported abdominal pain, fatigue, chills, sweating, ~30 lb unintentional weight loss over last few weeks-months. Abdominal pain is in the RUQ and radiates to the back and shoulders, sharp, constant, has been worsening over the last 3 weeks. Denies chest pain, SOB, n/v, diarrhea.  PMH significant for diabetes, HTN, gout, arthritis. Reports he does not take anything for his diabetes. Past abdominal surgeries include a ventral hernia repair with mesh in 2008. Does not take any blood thinning medications. Quit smoking ~30 yrs ago, denies alcohol or illicit drug use.   ROS: Review of Systems  Constitutional: Positive for chills, fever, malaise/fatigue and weight loss.  Respiratory: Positive for cough. Negative for sputum production and shortness of breath.   Cardiovascular: Negative for chest pain and palpitations.  Gastrointestinal: Positive for abdominal pain. Negative for constipation, diarrhea, nausea and vomiting.  Genitourinary: Positive for dysuria and urgency. Negative for hematuria.  Musculoskeletal: Positive for back pain.  Neurological: Negative for seizures, loss of consciousness and headaches.  All other systems reviewed and are negative.   Family History  Problem Relation Age of Onset  . Cancer Mother   . Diabetes Mother   . Heart failure Father   . Hypertension Father   . Diabetes Father     Past Medical History:  Diagnosis Date  . Arthritis   . Diabetes mellitus without complication ([place])    diagnosed about age 50  . Gout   . Hypertension    boarderline    Past Surgical History:  Procedure Laterality Date  . HERNIA REPAIR  2008  . MASS EXCISION Left age 4   L thigh- benign    Social History:  reports that  he quit smoking about 28 years ago. His smoking use included Cigarettes. He has a 2.50 pack-year smoking history. He has never used smokeless tobacco. He reports that he does not drink alcohol or use drugs.  Allergies: No Known Allergies  Medications Prior to Admission  Medication Sig Dispense Refill  . oxyCODONE-acetaminophen (PERCOCET) 10-325 MG tablet Take 1 tablet by mouth every 4 (four) hours as needed for pain. 150 tablet 0  . albuterol (PROVENTIL HFA;VENTOLIN HFA) 108 (90 Base) MCG/ACT inhaler Inhale 2 puffs into the lungs every 6 (six) hours as needed for wheezing or shortness of breath. 1 Inhaler 0  . benzonatate (TESSALON PERLES) 100 MG capsule 1-2 po q 8 hour prn cough (Patient taking differently: Take 100-200 mg by mouth 3 (three) times daily as needed for cough. 1-2 po q 8 hour prn cough) 20 capsule 3  . cephALEXin (KEFLEX) 500 MG capsule Take 1 capsule (500 mg total) by mouth 4 (four) times daily. 28 capsule 0  . metFORMIN (GLUCOPHAGE) 1000 MG tablet Take 1 tablet (1,000 mg total) by mouth 2 (two) times daily with a meal. 180 tablet 3    Blood pressure 132/85, pulse 87, temperature 98.2 F (36.8 C), temperature source Oral, resp. rate (!) 22, height _0  (1.702 m), weight (!) 170.5 kg (375 lb 12.8 oz), SpO2 98 %. Physical Exam: Physical Exam  Constitutional: He is oriented to person, place, and time. He appears well-developed. He is cooperative.  Non-toxic appearance. No distress.  HENT:  Head: Normocephalic and atraumatic.  Right Ear: External ear normal.  Left Ear: External ear normal.  Nose: Nose normal.  Mouth/Throat: Oropharynx is clear and moist and mucous membranes are normal.  Eyes: Pupils are equal, round, and reactive to light. Conjunctivae and lids are normal. No scleral icterus.  Neck: Normal range of motion and phonation normal. Neck supple. No thyromegaly present.  Cardiovascular: Normal rate and regular rhythm.   Pulses:      Radial pulses are 2+ on the right  side, and 2+ on the left side.       Dorsalis pedis pulses are 2+ on the right side, and 2+ on the left side.  BL LE edema  Pulmonary/Chest: Effort normal. No tachypnea.  Breath sounds slightly diminished bilaterally  Abdominal: Soft. He exhibits distension (mild). Bowel sounds are decreased. There is tenderness in the right upper quadrant and epigastric area. There is no rigidity, no rebound and no guarding.  Morbidly obese  Musculoskeletal:  ROM intact in BL upper and lower extremities  Neurological: He is alert and oriented to person, place, and time. No sensory deficit.  Skin: Skin is warm, dry and intact. No rash noted. He is not diaphoretic.  Psychiatric: He has a normal mood and affect. His speech is normal and behavior is normal.    Results for orders placed or performed during the hospital encounter of 06/28/17 (from the past 48 hour(s))  Urinalysis, Routine w reflex microscopic     Status: Abnormal   Collection Time: 06/28/17  6:28 PM  Result Value Ref Range   Color, Urine AMBER (A) YELLOW    Comment: BIOCHEMICALS MAY BE AFFECTED BY COLOR   APPearance CLEAR CLEAR   Specific Gravity, Urine 1.040 (H) 1.005 - 1.030   pH 6.0 5.0 - 8.0   Glucose, UA 50 (A) NEGATIVE mg/dL   Hgb urine dipstick NEGATIVE NEGATIVE   Bilirubin Urine SMALL (A) NEGATIVE   Ketones, ur NEGATIVE NEGATIVE mg/dL   Protein, ur 30 (A) NEGATIVE mg/dL   Nitrite NEGATIVE NEGATIVE   Leukocytes, UA NEGATIVE NEGATIVE   RBC / HPF 0-5 0 - 5 RBC/hpf   WBC, UA 0-5 0 - 5 WBC/hpf   Bacteria, UA RARE (A) NONE SEEN   Squamous Epithelial / LPF NONE SEEN NONE SEEN   Mucus PRESENT   Influenza panel by PCR (type A & B)     Status: None   Collection Time: 06/28/17  6:32 PM  Result Value Ref Range   Influenza A By PCR NEGATIVE NEGATIVE   Influenza B By PCR NEGATIVE NEGATIVE    Comment: (NOTE) The Xpert Xpress Flu assay is intended as an aid in the diagnosis of  influenza and should not be used as a sole basis for  treatment.  This  assay is FDA approved for nasopharyngeal swab specimens only. Nasal  washings and aspirates are unacceptable for Xpert Xpress Flu testing.   Lactic acid, plasma     Status: None   Collection Time: 06/28/17  6:49 PM  Result Value Ref Range   Lactic Acid, Venous 1.4 0.5 - 1.9 mmol/L  Blood culture (routine x 2)     Status: None (Preliminary result)   Collection Time: 06/28/17  6:49 PM  Result Value Ref Range   Specimen Description RIGHT ANTECUBITAL    Special Requests      BOTTLES DRAWN AEROBIC AND ANAEROBIC Blood Culture adequate volume   Culture PENDING    Report Status PENDING   Blood culture (routine x 2)     Status: None (Preliminary result)  Collection Time: 06/28/17  6:49 PM  Result Value Ref Range   Specimen Description BLOOD RIGHT HAND    Special Requests      BOTTLES DRAWN AEROBIC AND ANAEROBIC Blood Culture adequate volume   Culture PENDING    Report Status PENDING   Lipase, blood     Status: None   Collection Time: 06/28/17  6:49 PM  Result Value Ref Range   Lipase 20 11 - 51 U/L  Lactic acid, plasma     Status: None   Collection Time: 06/28/17  8:17 PM  Result Value Ref Range   Lactic Acid, Venous 1.5 0.5 - 1.9 mmol/L  CBG monitoring, ED     Status: Abnormal   Collection Time: 06/28/17 10:10 PM  Result Value Ref Range   Glucose-Capillary 214 (H) 65 - 99 mg/dL  MRSA PCR Screening     Status: None   Collection Time: 06/29/17  4:12 AM  Result Value Ref Range   MRSA by PCR NEGATIVE NEGATIVE    Comment:        The GeneXpert MRSA Assay (FDA approved for NASAL specimens only), is one component of a comprehensive MRSA colonization surveillance program. It is not intended to diagnose MRSA infection nor to guide or monitor treatment for MRSA infections.   Glucose, capillary     Status: Abnormal   Collection Time: 06/29/17  5:04 AM  Result Value Ref Range   Glucose-Capillary 155 (H) 65 - 99 mg/dL   Dg Chest 2 View  Result Date:  06/28/2017 CLINICAL DATA:  Shortness of breath.  Cough . EXAM: CHEST  2 VIEW COMPARISON:  06/10/2017. FINDINGS: Mediastinum hilar structures normal. Cardiomegaly with mild pulmonary vascular congestion. No overt pulmonary edema. Mild left base subsegmental atelectasis. No prominent pleural effusion. Pleural thickening noted bilaterally unchanged consistent scarring . IMPRESSION: 1. Cardiomegaly with mild pulmonary venous congestion. 2. Mild left base subsegmental atelectasis. Electronically Signed   By: Marcello Moores  Register   On: 06/28/2017 14:29   Ct Abdomen Pelvis W Contrast  Result Date: 06/28/2017 CLINICAL DATA:  Initial evaluation for acute abdominal infection, right upper quadrant tenderness. EXAM: CT ABDOMEN AND PELVIS WITH CONTRAST TECHNIQUE: Multidetector CT imaging of the abdomen and pelvis was performed using the standard protocol following bolus administration of intravenous contrast. CONTRAST:  147m ISOVUE-300 IOPAMIDOL (ISOVUE-300) INJECTION 61% COMPARISON:  Prior ultrasound 08/31/2016. FINDINGS: Lower chest: Scattered bibasilar atelectatic changes. Visualized lungs otherwise clear. Hepatobiliary: Multiple ill-defined hypodense lesions seen within the liver, predominantly involving the right hepatic lobe. These are predominantly subcapsular in location. For reference purposes, a discrete lesion positioned within the right hepatic lobe measures approximately 4.0 x 3.7 cm in size (series 2, image 40). Associated bulging of the hepatic contour at a few areas with prominent perihepatic inflammatory stranding. Findings favored to reflect intrahepatic abscess ease, although metastatic disease could also be considered. Additionally, inflammatory stranding seen about the gallbladder in the gallbladder fossa. The gallbladder itself appears to be partially contracted. There is an adjacent oblong collection/lesion that measures approximately 8.2 x 3.5 x 5.8 cm (series 2, image 42), positioned adjacent to the  gallbladder. This appears to be contiguous with several of the intrahepatic lesions (series 6, image 51, 46, 43). Finding also favored to reflect abscess, although mass or adenopathy could be considered as well. Hazy inflammatory stranding extends inferiorly and laterally about the liver. No appreciable biliary dilatation. Pancreas: Pancreas within normal limits. Spleen: Spleen within normal limits. Adrenals/Urinary Tract: Adrenal glands are normal. Kidneys equal in size with  symmetric enhancement. Probable punctate nonobstructive left renal nephrolithiasis noted. No hydronephrosis or focal enhancing renal mass. No hydroureter. Partially distended bladder within normal limits. Stomach/Bowel: Stomach within normal limits. Hazy inflammatory stranding about the second- third portion of the duodenum related to the inflammatory process about the gallbladder an liver. No evidence for bowel obstruction. Appendix is normal. No other acute inflammatory changes about the bowels. Specifically, no or significant inflammatory changes about the bowels seen to serve as a source for intrahepatic seeding. Vascular/Lymphatic: Normal intravascular enhancement seen throughout the intra-abdominal aorta and its branch vessels. No adenopathy. Reproductive: Prostate normal. Other: No free intraperitoneal air. No free fluid. Sequelae of prior ventral hernia repair noted. There is a recurrent mildly complex fat containing ventral hernia at the lower margin of the mesh. Loop of small bowel closely approximates the hernia neck. Musculoskeletal: No acute osseus abnormality. No worrisome lytic or blastic osseous lesions. IMPRESSION: 1. Multiple abnormal intrahepatic lesions as above with adjacent perihepatic inflammatory stranding. Given the adjacent inflammatory changes, findings favored to reflect sequelae of infection with intrahepatic abscesses. Exact source not definitively identified on this exam, as no significant inflammatory changes are  seen about the bowels themselves. Metastatic disease could also be considered, although is perhaps less favored. Correlation with percutaneous sampling would likely be helpful for further evaluation. 2. Additional 8.2 x 3.5 x 5.8 cm collection/lesion adjacent to the gallbladder as above, favored to be contiguous with a few of the peripheral intrahepatic lesions. Finding also favored to reflect abscess. The adjacent gallbladder is contracted with hazy inflammatory stranding in the gallbladder fossa. Under clear whether these inflammatory changes are related to adjacent infection/abscess or possibly concomitant cholecystitis. No biliary dilatation identified. 3. Sequelae of prior hernia repair with recurrent fat containing ventral hernia. Electronically Signed   By: Jeannine Boga M.D.   On: 06/28/2017 20:39      Assessment/Plan HTN Diabetes mellitus - A1c 13.4, glucose in the 200s ?UTI - per primary team  Liver abscess Questionable Cholecystitis - CT: multiple intrahepatic lesions with inflammatory stranding, abscess adjacent to the gallbladder 8x3.5x6 cm approx., recurrent fat containing ventral hernia - WBC 21.9, afebrile - AST 115, ALT 73, Alk Phos 474, Tbili 3.3 - no indications for emergent surgical intervention - Continue IV abx - have consulted IR for percutaneous drainage, keep patient NPO until IR evaluates  FEN: NPO  VTE: SCDs, lovenox ID: Zosyn (10/23>>), Flagyl (10/23>>)  Plan: IR consulted for drainage.Continue IV abx. AM labs  Brigid Re, [facility] Surgery 06/29/2017, 7:36 AM Pager: [phone] Consults: [phone] Mon-Fri 7:00 am-4:30 pm Sat-Sun 7:00 am-11:30 am

## 2017-06-29 NOTE — Progress Notes (Signed)
ANTIBIOTIC CONSULT NOTE-Preliminary  Pharmacy Consult for zosyn Indication: intra-abdominal infection  No Known Allergies  Patient Measurements: Height: 5\' 7"  (170.2 cm) Weight: (!) 375 lb (170.1 kg) IBW/kg (Calculated) : 66.1 Adjusted Body Weight:   Vital Signs: Temp: 98.5 F (36.9 C) (10/23 2320) Temp Source: Oral (10/23 2320) BP: 132/76 (10/24 0000) Pulse Rate: 91 (10/24 0000)  Labs:  Recent Labs  06/28/17 1232  WBC 21.9*  HGB 11.9*  PLT 491*  CREATININE 0.71    Estimated Creatinine Clearance: 164.5 mL/min (by C-G formula based on SCr of 0.71 mg/dL).  No results for input(s): VANCOTROUGH, VANCOPEAK, VANCORANDOM, GENTTROUGH, GENTPEAK, GENTRANDOM, TOBRATROUGH, TOBRAPEAK, TOBRARND, AMIKACINPEAK, AMIKACINTROU, AMIKACIN in the last 72 hours.   Microbiology: Recent Results (from the past 720 hour(s))  Blood culture (routine x 2)     Status: None (Preliminary result)   Collection Time: 06/28/17  6:49 PM  Result Value Ref Range Status   Specimen Description RIGHT ANTECUBITAL  Final   Special Requests   Final    BOTTLES DRAWN AEROBIC AND ANAEROBIC Blood Culture adequate volume   Culture PENDING  Incomplete   Report Status PENDING  Incomplete  Blood culture (routine x 2)     Status: None (Preliminary result)   Collection Time: 06/28/17  6:49 PM  Result Value Ref Range Status   Specimen Description BLOOD RIGHT HAND  Final   Special Requests   Final    BOTTLES DRAWN AEROBIC AND ANAEROBIC Blood Culture adequate volume   Culture PENDING  Incomplete   Report Status PENDING  Incomplete    Medical History: Past Medical History:  Diagnosis Date  . Arthritis   . Diabetes mellitus without complication ([place])    diagnosed about age 43  . Gout   . Hypertension    boarderline    Medications:  Anti-infectives    Start     Dose/Rate Route Frequency Ordered Stop   06/29/17 0430  piperacillin-tazobactam (ZOSYN) IVPB 3.375 g     3.375 g 12.5 mL/hr over 240 Minutes  Intravenous Every 8 hours 06/29/17 0042     06/29/17 0013  metroNIDAZOLE (FLAGYL) IVPB 500 mg     500 mg 100 mL/hr over 60 Minutes Intravenous Every 8 hours 06/29/17 0013     06/28/17 2100  metroNIDAZOLE (FLAGYL) IVPB 500 mg     500 mg 100 mL/hr over 60 Minutes Intravenous  Once 06/28/17 2049 06/28/17 2214   06/28/17 2015  piperacillin-tazobactam (ZOSYN) IVPB 3.375 g     3.375 g 100 mL/hr over 30 Minutes Intravenous  Once 06/28/17 2008 06/28/17 2101      Assessment: 51 yo male in ED c/o abdominal pain, weight loss, fatigue, chills.  Hx DM, gout, HTN.  CT showed several liver abscesses and fluid around gallbladder. Starting flagyl and zosyn.    Plan:  Preliminary review of pertinent patient information completed.  Protocol will be initiated with dose(s) of zosyn 3.375 grams Q8 hours.  Forestine Na clinical pharmacist will complete review during morning rounds to assess patient and finalize treatment regimen if needed.  Sofia Vanmeter, [place], RPH 06/29/2017,12:44 AM

## 2017-06-29 NOTE — Progress Notes (Signed)
Pharmacy Antibiotic Note  [name] is a 51 y.o. male on zosyn for cholecystitis now with blood cultures 2/2 growing gram + cocci. He will begin vancomycin.   Vancomycin trough goal 15-20  Plan: 1) Vancomycin 2500mg  IV x 1 then 1500mg  IV q12 2) Follow renal function, cultures, LOT, level if needed  Height: 5\' 7"  (170.2 cm) Weight: (!) 375 lb 12.8 oz (170.5 kg) IBW/kg (Calculated) : 66.1  Temp (24hrs), Avg:98.4 F (36.9 C), Min:97.5 F (36.4 C), Max:99.6 F (37.6 C)   Recent Labs Lab 06/28/17 1232 06/28/17 1849 06/28/17 2017 06/29/17 0730  WBC 21.9*  --   --  17.7*  CREATININE 0.71  --   --  0.69  LATICACIDVEN  --  1.4 1.5  --     Estimated Creatinine Clearance: 164.8 mL/min (by C-G formula based on SCr of 0.69 mg/dL).    No Known Allergies  Antimicrobials this admission: 10/23 Flagyl >>10/24 10/23 Zosyn >> 10/24 Vancomycin >>  Dose adjustments this admission: n/a  Microbiology results: 10/23 urine cx >> 10/23 blood x2 >> gram + cocci  Thank you for allowing pharmacy to be a part of this patient's care.  Deboraha Sprang 06/29/2017 4:57 PM

## 2017-06-29 NOTE — Progress Notes (Signed)
PROGRESS NOTE    [name]  ZOX:[id number] DOB: [birth year] DOA: 06/28/2017 PCP: [name], PA-C  Outpatient Specialists:     Brief Narrative:  Patient is 51 y.o. male who presented to the [facility] ED with abdominal pain, fatigue, and weight loss.  Labs were significant for leukocytosis, mild anemia, abnormal LFTs, and hyperglycemia.  CXR show mild pulmonary venous congestion and cardiomegaly.  CT abdomen/pelvis showed multiple intrahepatic lesions with perihepatic inflammatory stranding and fluid collection around the gallbladder with thickening of the gallbladder wall.  General surgery was consulted who recommended patient be transferred to [facility].  Started flagyl and zosyn on 10/23 per pharmacy consult.  General surgery evaluated patient 10/24: emergent cholecystectomy not currently indicated.  IR was consulted: percutaneous catheter to be placed 10/25.     Assessment & Plan:   Active Problems:   Hepatic abscess   Liver abscess   1. Hepatic Abscess -CT abdomen/pelvis showed multiple intrahepatic lesions with perihepatic inflammatory stranding -Discontinue IV Flagyl as Zosyn provide anaerobic coverage -Continue IV Zosyn -Hepatic panel pending -Continue to monitor LFTs -IR to perform CT-guided percutaneous catheter placement tomorrow  2. Type 2 Diabetes Mellitus -Hemoglobin A1c 13.4 on 10/23 -CBG 159 this morning -Continue sliding scale Novolog -Continue CBG monitoring  3. Anemia -Hemoglobin 10.6 this morning, receiving IV fluids -Continue to monitor  4. Cholecystitis -Per general surgery, emergent surgery not indicated at this time -Continue to monitor    DVT prophylaxis: Lovenox (hold for IR procedure), SCDs Code Status: Full Family Communication: Spoke with patient Disposition Plan: Discharge home when stable s/p drain placement   Consultants:   General surgery  Interventional radiology  Procedures:    None  Antimicrobials:   IV Zosyn  (10/23 - )  IV Flagyl (10/23 - )    Subjective: Patient continues to have abdominal pain, worse at umbilicus.  States he is thirsty and hungry.  Would like to eat.  Denies nausea or vomiting.  Objective: Vitals:   06/29/17 0251 06/29/17 0252 06/29/17 0403 06/29/17 0759  BP:  119/80 132/85 128/83  Pulse: 82  87 82  Resp: 17 19 (!) 22 19  Temp:   98.2 F (36.8 C) 97.6 F (36.4 C)  TempSrc:   Oral Oral  SpO2: 98%  98% 98%  Weight:   (!) 170.5 kg (375 lb 12.8 oz)   Height:   5\' 7"  (1.702 m)     Intake/Output Summary (Last 24 hours) at 06/29/17 1036 Last data filed at 06/29/17 8119  Gross per 24 hour  Intake             2850 ml  Output                0 ml  Net             2850 ml   Filed Weights   06/28/17 1535 06/29/17 0403  Weight: (!) 170.1 kg (375 lb) (!) 170.5 kg (375 lb 12.8 oz)    Examination:  General exam: Laying in hospital bed in no acute distress.  Respiratory system: Clear to auscultation bilaterally. Respiratory effort normal. Cardiovascular system: S1 & S2 heard, RRR. No JVD, murmurs, rubs, gallops or clicks. No pedal edema. Gastrointestinal system: Abdomen is obese, mildly distended.  Diffusely tender to palpation, worse at umbilicus and RUQ.  No organomegaly or masses felt. Normal bowel sounds heard. Central nervous system: Alert and oriented. No focal neurological deficits. Extremities: Symmetric 5 x 5 power. Skin: No rashes, lesions or  ulcers Psychiatry: Judgement and insight appear normal. Mood & affect appropriate.     Data Reviewed: I have personally reviewed following labs and imaging studies  CBC:  Recent Labs Lab 06/28/17 1232 06/29/17 0730  WBC 21.9* 17.7*  NEUTROABS 16.7*  --   HGB 11.9* 10.6*  HCT 36.8* 33.4*  MCV 82.5 81.5  PLT 491* 572*   Basic Metabolic Panel:  Recent Labs Lab 06/28/17 1232 06/29/17 0730  NA 138  --   K 4.2  --   CL 101  --   CO2 26  --   GLUCOSE 239*  --   BUN 7  --   CREATININE 0.71 0.69  CALCIUM  8.9  --    GFR: Estimated Creatinine Clearance: 164.8 mL/min (by C-G formula based on SCr of 0.69 mg/dL). Liver Function Tests:  Recent Labs Lab 06/28/17 1232  AST 115*  ALT 73*  ALKPHOS 474*  BILITOT 3.3*  PROT 7.9  ALBUMIN 2.2*    Recent Labs Lab 06/28/17 1849  LIPASE 20   No results for input(s): AMMONIA in the last 168 hours. Coagulation Profile: No results for input(s): INR, PROTIME in the last 168 hours. Cardiac Enzymes: No results for input(s): CKTOTAL, CKMB, CKMBINDEX, TROPONINI in the last 168 hours. BNP (last 3 results) No results for input(s): PROBNP in the last 8760 hours. HbA1C:  Recent Labs  06/28/17 1232  HGBA1C 13.4*   CBG:  Recent Labs Lab 06/28/17 2210 06/29/17 0504 06/29/17 0754  GLUCAP 214* 155* 159*   Lipid Profile:  Recent Labs  06/28/17 1232  CHOL 154  HDL 10*  LDLCALC 96  TRIG 238*  CHOLHDL 15.4   Thyroid Function Tests:  Recent Labs  06/28/17 1232  TSH 0.532   Anemia Panel: No results for input(s): VITAMINB12, FOLATE, FERRITIN, TIBC, IRON, RETICCTPCT in the last 72 hours. Urine analysis:    Component Value Date/Time   COLORURINE AMBER (A) 06/28/2017 1828   APPEARANCEUR CLEAR 06/28/2017 1828   LABSPEC 1.040 (H) 06/28/2017 1828   PHURINE 6.0 06/28/2017 1828   GLUCOSEU 50 (A) 06/28/2017 1828   HGBUR NEGATIVE 06/28/2017 1828   BILIRUBINUR SMALL (A) 06/28/2017 1828   BILIRUBINUR LARGE 06/28/2017 1155   KETONESUR NEGATIVE 06/28/2017 1828   PROTEINUR 30 (A) 06/28/2017 1828   UROBILINOGEN 2.0 (A) 06/28/2017 1155   NITRITE NEGATIVE 06/28/2017 1828   LEUKOCYTESUR NEGATIVE 06/28/2017 1828   Sepsis Labs: @LABRCNTIP (procalcitonin:4,lacticidven:4)  ) Recent Results (from the past 240 hour(s))  Blood culture (routine x 2)     Status: None (Preliminary result)   Collection Time: 06/28/17  6:49 PM  Result Value Ref Range Status   Specimen Description RIGHT ANTECUBITAL  Final   Special Requests   Final    BOTTLES DRAWN  AEROBIC AND ANAEROBIC Blood Culture adequate volume   Culture NO GROWTH < 12 HOURS  Final   Report Status PENDING  Incomplete  Blood culture (routine x 2)     Status: None (Preliminary result)   Collection Time: 06/28/17  6:49 PM  Result Value Ref Range Status   Specimen Description BLOOD RIGHT HAND  Final   Special Requests   Final    BOTTLES DRAWN AEROBIC AND ANAEROBIC Blood Culture adequate volume   Culture NO GROWTH < 12 HOURS  Final   Report Status PENDING  Incomplete  MRSA PCR Screening     Status: None   Collection Time: 06/29/17  4:12 AM  Result Value Ref Range Status   MRSA by PCR NEGATIVE NEGATIVE  Final    Comment:        The GeneXpert MRSA Assay (FDA approved for NASAL specimens only), is one component of a comprehensive MRSA colonization surveillance program. It is not intended to diagnose MRSA infection nor to guide or monitor treatment for MRSA infections.          Radiology Studies: Dg Chest 2 View  Result Date: 06/28/2017 CLINICAL DATA:  Shortness of breath.  Cough . EXAM: CHEST  2 VIEW COMPARISON:  06/10/2017. FINDINGS: Mediastinum hilar structures normal. Cardiomegaly with mild pulmonary vascular congestion. No overt pulmonary edema. Mild left base subsegmental atelectasis. No prominent pleural effusion. Pleural thickening noted bilaterally unchanged consistent scarring . IMPRESSION: 1. Cardiomegaly with mild pulmonary venous congestion. 2. Mild left base subsegmental atelectasis. Electronically Signed   By: Marcello Moores  Register   On: 06/28/2017 14:29   Ct Abdomen Pelvis W Contrast  Result Date: 06/28/2017 CLINICAL DATA:  Initial evaluation for acute abdominal infection, right upper quadrant tenderness. EXAM: CT ABDOMEN AND PELVIS WITH CONTRAST TECHNIQUE: Multidetector CT imaging of the abdomen and pelvis was performed using the standard protocol following bolus administration of intravenous contrast. CONTRAST:  168mL ISOVUE-300 IOPAMIDOL (ISOVUE-300) INJECTION  61% COMPARISON:  Prior ultrasound 08/31/2016. FINDINGS: Lower chest: Scattered bibasilar atelectatic changes. Visualized lungs otherwise clear. Hepatobiliary: Multiple ill-defined hypodense lesions seen within the liver, predominantly involving the right hepatic lobe. These are predominantly subcapsular in location. For reference purposes, a discrete lesion positioned within the right hepatic lobe measures approximately 4.0 x 3.7 cm in size (series 2, image 40). Associated bulging of the hepatic contour at a few areas with prominent perihepatic inflammatory stranding. Findings favored to reflect intrahepatic abscess ease, although metastatic disease could also be considered. Additionally, inflammatory stranding seen about the gallbladder in the gallbladder fossa. The gallbladder itself appears to be partially contracted. There is an adjacent oblong collection/lesion that measures approximately 8.2 x 3.5 x 5.8 cm (series 2, image 42), positioned adjacent to the gallbladder. This appears to be contiguous with several of the intrahepatic lesions (series 6, image 51, 46, 43). Finding also favored to reflect abscess, although mass or adenopathy could be considered as well. Hazy inflammatory stranding extends inferiorly and laterally about the liver. No appreciable biliary dilatation. Pancreas: Pancreas within normal limits. Spleen: Spleen within normal limits. Adrenals/Urinary Tract: Adrenal glands are normal. Kidneys equal in size with symmetric enhancement. Probable punctate nonobstructive left renal nephrolithiasis noted. No hydronephrosis or focal enhancing renal mass. No hydroureter. Partially distended bladder within normal limits. Stomach/Bowel: Stomach within normal limits. Hazy inflammatory stranding about the second- third portion of the duodenum related to the inflammatory process about the gallbladder an liver. No evidence for bowel obstruction. Appendix is normal. No other acute inflammatory changes about  the bowels. Specifically, no or significant inflammatory changes about the bowels seen to serve as a source for intrahepatic seeding. Vascular/Lymphatic: Normal intravascular enhancement seen throughout the intra-abdominal aorta and its branch vessels. No adenopathy. Reproductive: Prostate normal. Other: No free intraperitoneal air. No free fluid. Sequelae of prior ventral hernia repair noted. There is a recurrent mildly complex fat containing ventral hernia at the lower margin of the mesh. Loop of small bowel closely approximates the hernia neck. Musculoskeletal: No acute osseus abnormality. No worrisome lytic or blastic osseous lesions. IMPRESSION: 1. Multiple abnormal intrahepatic lesions as above with adjacent perihepatic inflammatory stranding. Given the adjacent inflammatory changes, findings favored to reflect sequelae of infection with intrahepatic abscesses. Exact source not definitively identified on this exam, as no significant  inflammatory changes are seen about the bowels themselves. Metastatic disease could also be considered, although is perhaps less favored. Correlation with percutaneous sampling would likely be helpful for further evaluation. 2. Additional 8.2 x 3.5 x 5.8 cm collection/lesion adjacent to the gallbladder as above, favored to be contiguous with a few of the peripheral intrahepatic lesions. Finding also favored to reflect abscess. The adjacent gallbladder is contracted with hazy inflammatory stranding in the gallbladder fossa. Under clear whether these inflammatory changes are related to adjacent infection/abscess or possibly concomitant cholecystitis. No biliary dilatation identified. 3. Sequelae of prior hernia repair with recurrent fat containing ventral hernia. Electronically Signed   By: Jeannine Boga M.D.   On: 06/28/2017 20:39        Scheduled Meds: . [START ON 06/30/2017] enoxaparin (LOVENOX) injection  40 mg Subcutaneous Q24H  . [START ON 06/30/2017] Influenza  vac split quadrivalent PF  0.5 mL Intramuscular Tomorrow-1000  . insulin aspart  0-9 Units Subcutaneous TID WC   Continuous Infusions: . sodium chloride 100 mL/hr at 06/29/17 0024  . metronidazole Stopped (06/29/17 2637)  . piperacillin-tazobactam (ZOSYN)  IV 3.375 g (06/29/17 0528)     LOS: 1 day     T[place]ania Hall-Potvin, PA-S [facility] Pager [phone]  If 7PM-7AM, please contact night-coverage [email or web address] Password [email or web address] 06/29/2017, 10:36 AM

## 2017-06-29 NOTE — Care Management Note (Signed)
Case Management Note  Patient Details  Name: [name] MRN: [id number] Date of Birth: 1965/09/17  Subjective/Objective:   From home with brother, [name].  PTA indep,  Patient states he has a follow up apt with [facility] next week and he will be going to the free clinic in [facility] for medication ast.  Depending on when he is discharged, if over the weekend he may need ast with [facility] because clinic will not be open.  He states he has transport by his cousin at discharge.  He presents with Hepatic abscess and liver abscess, he is for percutaneous drain by IR tomorrow.  Patient states he may need a cane at discharge.                   Action/Plan: NCM will follow for dc needs.   Expected Discharge Date:                  Expected Discharge Plan:  Home/Self Care  In-House Referral:     Discharge planning Services  CM Consult  Post Acute Care Choice:    Choice offered to:     DME Arranged:    DME Agency:     HH Arranged:    HH Agency:     Status of Service:  In process, will continue to follow  If discussed at Long Length of Stay Meetings, dates discussed:    Additional Comments:  Zenon Mayo, RN 06/29/2017, 4:18 PM

## 2017-06-29 NOTE — Consult Note (Signed)
Chief Complaint: Patient was seen in consultation today for intrahepatic abscess  Referring Physician(s): Dr. Georgette Dover  Supervising Physician: Daryll Brod  Patient Status: [facility] - In-pt  History of Present Illness: [name] is a 51 y.o. male with past medical history of DM, gout, HTN who presented to ED from his PCP office after he failed outpatient management of suspected respiratory infection.  He continued to experience fatigue, chills, sweating, and weight loss.  Labwork showed elevated AST/ALT, Alk phos.   CT Abdomen/Pelvis 10/23 shows: 1. Multiple abnormal intrahepatic lesions as above with adjacent perihepatic inflammatory stranding. Given the adjacent inflammatory changes, findings favored to reflect sequelae of infection with intrahepatic abscesses. Exact source not definitively identified on this exam, as no significant inflammatory changes are seen about the bowels themselves. Metastatic disease could also be considered, although is perhaps less favored. Correlation with percutaneous sampling would likely be helpful for further evaluation. 2. Additional 8.2 x 3.5 x 5.8 cm collection/lesion adjacent to the gallbladder as above, favored to be contiguous with a few of the peripheral intrahepatic lesions. Finding also favored to reflect abscess. The adjacent gallbladder is contracted with hazy inflammatory stranding in the gallbladder fossa. Under clear whether these inflammatory changes are related to adjacent infection/abscess or possibly concomitant cholecystitis. No biliary dilatation identified. 3. Sequelae of prior hernia repair with recurrent fat containing ventral hernia.  IR consulted for aspiration and drainage of abdominal fluid collections.  Patient received lovenox overnight.   Past Medical History:  Diagnosis Date  . Arthritis   . Diabetes mellitus without complication ([place])    diagnosed about age 2  . Gout   . Hypertension    boarderline     Past Surgical History:  Procedure Laterality Date  . HERNIA REPAIR  2008  . MASS EXCISION Left age 53   L thigh- benign    Allergies: Patient has no known allergies.  Medications: Prior to Admission medications   Medication Sig Start Date End Date Taking? Authorizing Provider  oxyCODONE-acetaminophen (PERCOCET) 10-325 MG tablet Take 1 tablet by mouth every 4 (four) hours as needed for pain. 06/15/17  Yes Sanjuana Kava, MD  albuterol (PROVENTIL HFA;VENTOLIN HFA) 108 (90 Base) MCG/ACT inhaler Inhale 2 puffs into the lungs every 6 (six) hours as needed for wheezing or shortness of breath. 06/28/17   [name], PA-C  benzonatate (TESSALON PERLES) 100 MG capsule 1-2 po q 8 hour prn cough Patient taking differently: Take 100-200 mg by mouth 3 (three) times daily as needed for cough. 1-2 po q 8 hour prn cough 06/28/17   [name], PA-C  cephALEXin (KEFLEX) 500 MG capsule Take 1 capsule (500 mg total) by mouth 4 (four) times daily. 06/28/17   [name], PA-C  metFORMIN (GLUCOPHAGE) 1000 MG tablet Take 1 tablet (1,000 mg total) by mouth 2 (two) times daily with a meal. 06/28/17   [name], PA-C     Family History  Problem Relation Age of Onset  . Cancer Mother   . Diabetes Mother   . Heart failure Father   . Hypertension Father   . Diabetes Father     Social History   Social History  . Marital status: Single    Spouse name: N/A  . Number of children: N/A  . Years of education: N/A   Social History Main Topics  . Smoking status: Former Smoker    Packs/day: 0.50    Years: 5.00    Types: Cigarettes    Quit date: 57  .  Smokeless tobacco: Never Used  . Alcohol use No  . Drug use: No  . Sexual activity: Not Asked   Other Topics Concern  . None   Social History Narrative  . None    Review of Systems  Constitutional: Negative for fatigue and fever.  Respiratory: Negative for cough and shortness of breath.   Cardiovascular: Negative for  chest pain.  Gastrointestinal: Negative for abdominal pain.  Psychiatric/Behavioral: Negative for behavioral problems and confusion.    Vital Signs: BP 128/83 (BP Location: Right Arm)   Pulse 82   Temp 97.6 F (36.4 C) (Oral)   Resp 19   Ht 5' 7" (1.702 m)   Wt (!) 375 lb 12.8 oz (170.5 kg)   SpO2 98%   BMI 58.86 kg/m   Physical Exam  Constitutional: He is oriented to person, place, and time. He appears well-developed.  Cardiovascular: Normal rate, regular rhythm and normal heart sounds.   Pulmonary/Chest: Effort normal and breath sounds normal. No respiratory distress.  Abdominal: Soft. There is tenderness (epigastric).  Neurological: He is alert and oriented to person, place, and time.  Skin: Skin is warm and dry.  Psychiatric: He has a normal mood and affect. His behavior is normal. Judgment and thought content normal.  Nursing note and vitals reviewed.   Imaging: Dg Chest 2 View  Result Date: 06/28/2017 CLINICAL DATA:  Shortness of breath.  Cough . EXAM: CHEST  2 VIEW COMPARISON:  06/10/2017. FINDINGS: Mediastinum hilar structures normal. Cardiomegaly with mild pulmonary vascular congestion. No overt pulmonary edema. Mild left base subsegmental atelectasis. No prominent pleural effusion. Pleural thickening noted bilaterally unchanged consistent scarring . IMPRESSION: 1. Cardiomegaly with mild pulmonary venous congestion. 2. Mild left base subsegmental atelectasis. Electronically Signed   By: Marcello Moores  Register   On: 06/28/2017 14:29   Dg Chest 2 View  Result Date: 06/10/2017 CLINICAL DATA:  Cough and congestion. EXAM: CHEST  2 VIEW COMPARISON:  Two-view chest x-ray 12/03/2015 FINDINGS: The heart size is exaggerated by low lung volumes. The visualized soft tissues and bony thorax are unremarkable. The lungs are poorly inflated on the lateral view. The visualized soft tissues and bony thorax are on remarkable. IMPRESSION: 1. Low lung volumes. 2. No acute cardiopulmonary disease  peer Electronically Signed   By: San Morelle M.D.   On: 06/10/2017 14:52   Ct Abdomen Pelvis W Contrast  Result Date: 06/28/2017 CLINICAL DATA:  Initial evaluation for acute abdominal infection, right upper quadrant tenderness. EXAM: CT ABDOMEN AND PELVIS WITH CONTRAST TECHNIQUE: Multidetector CT imaging of the abdomen and pelvis was performed using the standard protocol following bolus administration of intravenous contrast. CONTRAST:  15m ISOVUE-300 IOPAMIDOL (ISOVUE-300) INJECTION 61% COMPARISON:  Prior ultrasound 08/31/2016. FINDINGS: Lower chest: Scattered bibasilar atelectatic changes. Visualized lungs otherwise clear. Hepatobiliary: Multiple ill-defined hypodense lesions seen within the liver, predominantly involving the right hepatic lobe. These are predominantly subcapsular in location. For reference purposes, a discrete lesion positioned within the right hepatic lobe measures approximately 4.0 x 3.7 cm in size (series 2, image 40). Associated bulging of the hepatic contour at a few areas with prominent perihepatic inflammatory stranding. Findings favored to reflect intrahepatic abscess ease, although metastatic disease could also be considered. Additionally, inflammatory stranding seen about the gallbladder in the gallbladder fossa. The gallbladder itself appears to be partially contracted. There is an adjacent oblong collection/lesion that measures approximately 8.2 x 3.5 x 5.8 cm (series 2, image 42), positioned adjacent to the gallbladder. This appears to be contiguous with several  of the intrahepatic lesions (series 6, image 51, 46, 43). Finding also favored to reflect abscess, although mass or adenopathy could be considered as well. Hazy inflammatory stranding extends inferiorly and laterally about the liver. No appreciable biliary dilatation. Pancreas: Pancreas within normal limits. Spleen: Spleen within normal limits. Adrenals/Urinary Tract: Adrenal glands are normal. Kidneys equal  in size with symmetric enhancement. Probable punctate nonobstructive left renal nephrolithiasis noted. No hydronephrosis or focal enhancing renal mass. No hydroureter. Partially distended bladder within normal limits. Stomach/Bowel: Stomach within normal limits. Hazy inflammatory stranding about the second- third portion of the duodenum related to the inflammatory process about the gallbladder an liver. No evidence for bowel obstruction. Appendix is normal. No other acute inflammatory changes about the bowels. Specifically, no or significant inflammatory changes about the bowels seen to serve as a source for intrahepatic seeding. Vascular/Lymphatic: Normal intravascular enhancement seen throughout the intra-abdominal aorta and its branch vessels. No adenopathy. Reproductive: Prostate normal. Other: No free intraperitoneal air. No free fluid. Sequelae of prior ventral hernia repair noted. There is a recurrent mildly complex fat containing ventral hernia at the lower margin of the mesh. Loop of small bowel closely approximates the hernia neck. Musculoskeletal: No acute osseus abnormality. No worrisome lytic or blastic osseous lesions. IMPRESSION: 1. Multiple abnormal intrahepatic lesions as above with adjacent perihepatic inflammatory stranding. Given the adjacent inflammatory changes, findings favored to reflect sequelae of infection with intrahepatic abscesses. Exact source not definitively identified on this exam, as no significant inflammatory changes are seen about the bowels themselves. Metastatic disease could also be considered, although is perhaps less favored. Correlation with percutaneous sampling would likely be helpful for further evaluation. 2. Additional 8.2 x 3.5 x 5.8 cm collection/lesion adjacent to the gallbladder as above, favored to be contiguous with a few of the peripheral intrahepatic lesions. Finding also favored to reflect abscess. The adjacent gallbladder is contracted with hazy inflammatory  stranding in the gallbladder fossa. Under clear whether these inflammatory changes are related to adjacent infection/abscess or possibly concomitant cholecystitis. No biliary dilatation identified. 3. Sequelae of prior hernia repair with recurrent fat containing ventral hernia. Electronically Signed   By: Jeannine Boga M.D.   On: 06/28/2017 20:39    Labs:  CBC:  Recent Labs  06/10/17 1650 06/28/17 1232 06/29/17 0730  WBC 17.1* 21.9* 17.7*  HGB 13.8 11.9* 10.6*  HCT 42.5 36.8* 33.4*  PLT 261 491* 487*    COAGS: No results for input(s): INR, APTT in the last 8760 hours.  BMP:  Recent Labs  06/10/17 1650 06/28/17 1232 06/29/17 0730  NA 131* 138  --   K 4.1 4.2  --   CL 97* 101  --   CO2 25 26  --   GLUCOSE 349* 239*  --   BUN 5* 7  --   CALCIUM 8.7* 8.9  --   CREATININE 0.81 0.71 0.69  GFRNONAA >60 >60 >60  GFRAA >60 >60 >60    LIVER FUNCTION TESTS:  Recent Labs  06/10/17 1650 06/28/17 1232  BILITOT 0.9 3.3*  AST 23 115*  ALT 32 73*  ALKPHOS 150* 474*  PROT 7.7 7.9  ALBUMIN 2.9* 2.2*    TUMOR MARKERS: No results for input(s): AFPTM, CEA, CA199, CHROMGRNA in the last 8760 hours.  Assessment and Plan: Abdominal fluid collections, intrahepatic abscesses Patient presented to ED with worsening infection and elevated LFTs.  CT Abdomen demonstrates multiple intrahepatic abscesses as well as collection adjacent to the gall bladder. IR consulted for aspiration and  drainage of these collections.  Reviewed imaging with Dr. Annamaria Boots who approves patient for procedure.  Note patient received lovenox overnight.  He is currently stable, afebrile, on IV antibiotics.  Given recent anticoagulation dosing, will plan to proceed with aspiration and drainage tomorrow, 10/25.  Patient will be made NPO after midnight and blood thinners held.  Risks and benefits discussed with the patient including bleeding, infection, damage to adjacent structures, bowel perforation/fistula  connection, and sepsis. All of the patient's questions were answered, patient is agreeable to proceed. Consent signed and in chart.  Thank you for this interesting consult.  I greatly enjoyed meeting [name] and look forward to participating in their care.  A copy of this report was sent to the requesting provider on this date.  Electronically Signed: Docia Barrier, PA 06/29/2017, 10:01 AM   I spent a total of 40 Minutes in face to face in clinical consultation, greater than 50% of which was counseling/coordinating care for intrahepatic fluid collection.

## 2017-06-29 NOTE — Progress Notes (Addendum)
Nutrition Brief Note  Patient identified on the Malnutrition Screening Tool (MST) Report.  Per weight readings below, pt with 4% weight loss since July 2018. Not significant for time frame.  Wt Readings from Last 15 Encounters:  06/29/17 (!) 375 lb 12.8 oz (170.5 kg)  06/28/17 (!) 375 lb 12 oz (170.4 kg)  06/22/17 (!) 374 lb 3.2 oz (169.7 kg)  06/10/17 (!) 365 lb (165.6 kg)  03/22/17 (!) 390 lb (176.9 kg)  12/21/16 (!) 374 lb (169.6 kg)  11/23/16 (!) 373 lb (169.2 kg)  11/03/16 (!) 387 lb (175.5 kg)  10/13/16 (!) 387 lb (175.5 kg)  09/28/16 (!) 387 lb (175.5 kg)  05/11/16 (!) 385 lb (174.6 kg)  04/13/16 (!) 402 lb 6.4 oz (182.5 kg)  03/28/16 (!) 360 lb (163.3 kg)  03/17/16 (!) 365 lb (165.6 kg)  03/16/16 (!) 365 lb (165.6 kg)   Pt reports a good appetite PTA. States "they're not feeding me". Wants a cheeseburger.  Body mass index is 58.86 kg/m. Patient meets criteria for Obesity Class III based on current BMI.   Current diet order is Clear Liquids. NPO after MN for procedure. Labs and medications reviewed.   No nutrition interventions warranted at this time. If nutrition issues arise, please consult RD.   Arthur Holms, RD, LDN Pager #: [phone] After-Hours Pager #: [phone]

## 2017-06-29 NOTE — Telephone Encounter (Signed)
Routine call to follow up with client after initial visit with Primary care provider client was referred into the free clinic of [facility] and appointment was 06/28/17. Client states after seeing [place] at [facility] he began feeling worse and kept his appointment at the Free clinic. He states that he was sent for lab work yesterday after his office visit and was called back while at the pharmacy to pick up his medications. Client was referred to the [facility] Emergency room by the primary care office and is currently at [facility].  He states he has been told he has infection in his liver and gallbladder.  Client thankful for the referral made by Reva Bores and for the primary care office of [facility] [place]'s provider for their care.  Will follow up with client at a later date and client gives verbal permission to do so.

## 2017-06-29 NOTE — Progress Notes (Signed)
Inpatient Diabetes Program Recommendations  AACE/ADA: New Consensus Statement on Inpatient Glycemic Control (2015)  Target Ranges:  Prepandial:   less than 140 mg/dL      Peak postprandial:   less than 180 mg/dL (1-2 hours)      Critically ill patients:  140 - 180 mg/dL   Lab Results  Component Value Date   GLUCAP 155 (H) 06/29/2017   HGBA1C 13.4 (H) 06/28/2017   Review of Glycemic Control  Diabetes history: DM 2 diagnosed 2015 saw Dr. [place] Crane until insurance fell through  Inpatient Diabetes Program Recommendations:    A1c 13.4% patient will most likely need insulin at time of d/c. I do not recommend Metformin with current diagnosis. Patient is agreeable to insulin for now with hopes of eventually coming off.   Patient reports going to a clinic yesterday and was placed on metformin which he never had a chance to get filled. Patient was diagnosed with DM in 2015 was placed on metformin and was going to give exercise a chance.  Patient is well versed in things he should and should not be eating. Spoke with patient about checking his glucose levels. Discussed A1c and glucose goals.  Thanks,  Tama Headings RN, MSN, [facility] Inpatient Diabetes Coordinator Team Pager [phone] (8a-5p)

## 2017-06-30 ENCOUNTER — Inpatient Hospital Stay (HOSPITAL_COMMUNITY): Payer: Self-pay

## 2017-06-30 DIAGNOSIS — Z87891 Personal history of nicotine dependence: Secondary | ICD-10-CM

## 2017-06-30 DIAGNOSIS — D72829 Elevated white blood cell count, unspecified: Secondary | ICD-10-CM

## 2017-06-30 DIAGNOSIS — Z809 Family history of malignant neoplasm, unspecified: Secondary | ICD-10-CM

## 2017-06-30 DIAGNOSIS — R7881 Bacteremia: Secondary | ICD-10-CM

## 2017-06-30 DIAGNOSIS — B9561 Methicillin susceptible Staphylococcus aureus infection as the cause of diseases classified elsewhere: Secondary | ICD-10-CM

## 2017-06-30 DIAGNOSIS — Z833 Family history of diabetes mellitus: Secondary | ICD-10-CM

## 2017-06-30 DIAGNOSIS — Z8249 Family history of ischemic heart disease and other diseases of the circulatory system: Secondary | ICD-10-CM

## 2017-06-30 DIAGNOSIS — E1165 Type 2 diabetes mellitus with hyperglycemia: Secondary | ICD-10-CM

## 2017-06-30 DIAGNOSIS — E1169 Type 2 diabetes mellitus with other specified complication: Secondary | ICD-10-CM

## 2017-06-30 DIAGNOSIS — K750 Abscess of liver: Secondary | ICD-10-CM

## 2017-06-30 DIAGNOSIS — I10 Essential (primary) hypertension: Secondary | ICD-10-CM

## 2017-06-30 DIAGNOSIS — R634 Abnormal weight loss: Secondary | ICD-10-CM

## 2017-06-30 DIAGNOSIS — B954 Other streptococcus as the cause of diseases classified elsewhere: Secondary | ICD-10-CM

## 2017-06-30 DIAGNOSIS — R5081 Fever presenting with conditions classified elsewhere: Secondary | ICD-10-CM

## 2017-06-30 LAB — BLOOD CULTURE ID PANEL (REFLEXED)

## 2017-06-30 LAB — COMPREHENSIVE METABOLIC PANEL
ALT: 40 U/L (ref 17–63)
AST: 38 U/L (ref 15–41)
Albumin: 1.7 g/dL — ABNORMAL LOW (ref 3.5–5.0)
Alkaline Phosphatase: 406 U/L — ABNORMAL HIGH (ref 38–126)
Anion gap: 10 (ref 5–15)
BILIRUBIN TOTAL: 1.7 mg/dL — AB (ref 0.3–1.2)
CALCIUM: 8.2 mg/dL — AB (ref 8.9–10.3)
CHLORIDE: 96 mmol/L — AB (ref 101–111)
CO2: 27 mmol/L (ref 22–32)
CREATININE: 0.8 mg/dL (ref 0.61–1.24)
Glucose, Bld: 219 mg/dL — ABNORMAL HIGH (ref 65–99)
Potassium: 3.7 mmol/L (ref 3.5–5.1)
Sodium: 133 mmol/L — ABNORMAL LOW (ref 135–145)
TOTAL PROTEIN: 7.5 g/dL (ref 6.5–8.1)

## 2017-06-30 LAB — URINE CULTURE: Culture: NO GROWTH

## 2017-06-30 LAB — CBC
HCT: 36.2 % — ABNORMAL LOW (ref 39.0–52.0)
Hemoglobin: 11.8 g/dL — ABNORMAL LOW (ref 13.0–17.0)
MCH: 26.8 pg (ref 26.0–34.0)
MCHC: 32.6 g/dL (ref 30.0–36.0)
MCV: 82.3 fL (ref 78.0–100.0)
PLATELETS: 584 10*3/uL — AB (ref 150–400)
RBC: 4.4 MIL/uL (ref 4.22–5.81)
RDW: 14.7 % (ref 11.5–15.5)
WBC: 18.8 10*3/uL — AB (ref 4.0–10.5)

## 2017-06-30 LAB — IRON AND TIBC
IRON: 11 ug/dL — AB (ref 45–182)
SATURATION RATIOS: 8 % — AB (ref 17.9–39.5)
TIBC: 130 ug/dL — ABNORMAL LOW (ref 250–450)
UIBC: 119 ug/dL

## 2017-06-30 LAB — GLUCOSE, CAPILLARY
GLUCOSE-CAPILLARY: 181 mg/dL — AB (ref 65–99)
Glucose-Capillary: 204 mg/dL — ABNORMAL HIGH (ref 65–99)
Glucose-Capillary: 174 mg/dL — ABNORMAL HIGH (ref 65–99)
Glucose-Capillary: 205 mg/dL — ABNORMAL HIGH (ref 65–99)

## 2017-06-30 LAB — HIV ANTIBODY (ROUTINE TESTING W REFLEX): HIV SCREEN 4TH GENERATION: NONREACTIVE

## 2017-06-30 LAB — PROTIME-INR
INR: 1.24
PROTHROMBIN TIME: 15.5 s — AB (ref 11.4–15.2)

## 2017-06-30 LAB — FERRITIN: Ferritin: 1155 ng/mL — ABNORMAL HIGH (ref 24–336)

## 2017-06-30 LAB — HEPATITIS PANEL, ACUTE
HCV Ab: 0.2 s/co ratio (ref 0.0–0.9)
Hep A IgM: NEGATIVE
Hep B C IgM: NEGATIVE
Hepatitis B Surface Ag: NEGATIVE

## 2017-06-30 MED ORDER — FENTANYL CITRATE (PF) 100 MCG/2ML IJ SOLN
INTRAMUSCULAR | Status: AC | PRN
  Administered 2017-06-30: 50 ug via INTRAVENOUS
  Administered 2017-06-30: 100 ug via INTRAVENOUS
  Administered 2017-06-30 (×4): 50 ug via INTRAVENOUS

## 2017-06-30 MED ORDER — MIDAZOLAM HCL 2 MG/2ML IJ SOLN
INTRAMUSCULAR | Status: AC
  Filled 2017-06-30: qty 4

## 2017-06-30 MED ORDER — FENTANYL CITRATE (PF) 100 MCG/2ML IJ SOLN
INTRAMUSCULAR | Status: AC
  Filled 2017-06-30: qty 4

## 2017-06-30 MED ORDER — METRONIDAZOLE 500 MG PO TABS
500.0000 mg | ORAL_TABLET | Freq: Three times a day (TID) | ORAL | Status: DC
  Administered 2017-06-30 – 2017-07-05 (×14): 500 mg via ORAL
  Filled 2017-06-30 (×13): qty 1

## 2017-06-30 MED ORDER — DEXTROSE 5 % IV SOLN
2.0000 g | INTRAVENOUS | Status: DC
  Administered 2017-06-30 – 2017-07-04 (×5): 2 g via INTRAVENOUS
  Filled 2017-06-30 (×5): qty 2

## 2017-06-30 MED ORDER — MEPERIDINE HCL 50 MG/ML IJ SOLN
INTRAMUSCULAR | Status: AC
  Filled 2017-06-30: qty 2

## 2017-06-30 MED ORDER — MEPERIDINE HCL 25 MG/ML IJ SOLN
INTRAMUSCULAR | Status: AC | PRN
  Administered 2017-06-30: 50 mg via INTRAVENOUS

## 2017-06-30 MED ORDER — HYDROMORPHONE HCL 1 MG/ML IJ SOLN
INTRAMUSCULAR | Status: AC | PRN
  Administered 2017-06-30: 0.5 mg via INTRAVENOUS
  Administered 2017-06-30: 1 mg via INTRAVENOUS
  Administered 2017-06-30: 0.5 mg via INTRAVENOUS

## 2017-06-30 MED ORDER — LIDOCAINE-EPINEPHRINE 1 %-1:100000 IJ SOLN
INTRAMUSCULAR | Status: AC
  Filled 2017-06-30: qty 2

## 2017-06-30 MED ORDER — INSULIN GLARGINE 100 UNIT/ML ~~LOC~~ SOLN
10.0000 [IU] | Freq: Every day | SUBCUTANEOUS | Status: DC
  Administered 2017-06-30: 10 [IU] via SUBCUTANEOUS
  Filled 2017-06-30 (×2): qty 0.1

## 2017-06-30 MED ORDER — MIDAZOLAM HCL 2 MG/2ML IJ SOLN
INTRAMUSCULAR | Status: AC | PRN
  Administered 2017-06-30 (×3): 1 mg via INTRAVENOUS
  Administered 2017-06-30 (×2): 0.5 mg via INTRAVENOUS
  Administered 2017-06-30 (×2): 1 mg via INTRAVENOUS
  Administered 2017-06-30: 0.5 mg via INTRAVENOUS

## 2017-06-30 MED ORDER — LIDOCAINE-EPINEPHRINE 1 %-1:100000 IJ SOLN
INTRAMUSCULAR | Status: AC
  Filled 2017-06-30: qty 1

## 2017-06-30 MED ORDER — HYDROMORPHONE HCL 1 MG/ML IJ SOLN
INTRAMUSCULAR | Status: AC
  Administered 2017-07-01: 1 mg via INTRAVENOUS
  Filled 2017-06-30: qty 2

## 2017-06-30 MED ORDER — METOPROLOL TARTRATE 25 MG PO TABS
25.0000 mg | ORAL_TABLET | Freq: Two times a day (BID) | ORAL | Status: DC
  Administered 2017-06-30 (×2): 25 mg via ORAL
  Filled 2017-06-30 (×2): qty 1

## 2017-06-30 NOTE — Sedation Documentation (Signed)
Patient is resting comfortably. 

## 2017-06-30 NOTE — Sedation Documentation (Addendum)
Patient trembling-MD aware.  States he is cold.  BP and HR elevated.  Patient alert and oriented at present

## 2017-06-30 NOTE — Progress Notes (Signed)
Subjective/Chief Complaint: Comfortable Awaiting IR drainage today   Objective: Vital signs in last 24 hours: Temp:  [97.5 F (36.4 C)-98.1 F (36.7 C)] 98.1 F (36.7 C) (10/25 0816) Pulse Rate:  [82-99] 82 (10/25 0816) Resp:  [19-38] 21 (10/25 0816) BP: (125-147)/(76-99) 146/91 (10/25 0816) SpO2:  [93 %-98 %] 94 % (10/25 0816) Last BM Date: 06/27/17  Intake/Output from previous day: 10/24 0701 - 10/25 0700 In: 1180 [P.O.:480; I.V.:600; IV Piggyback:100] Out: 750 [Urine:750] Intake/Output this shift: Total I/O In: 600 [IV Piggyback:600] Out: -   Abd - tender in RUQ and epigastrium; distended Obese  Lab Results:   Recent Labs  06/29/17 0730 06/30/17 0709  WBC 17.7* 18.8*  HGB 10.6* 11.8*  HCT 33.4* 36.2*  PLT 487* 584*   BMET  Recent Labs  06/28/17 1232 06/29/17 0730 06/30/17 0709  NA 138  --  133*  K 4.2  --  3.7  CL 101  --  96*  CO2 26  --  27  GLUCOSE 239*  --  219*  BUN 7  --  <5*  CREATININE 0.71 0.69 0.80  CALCIUM 8.9  --  8.2*   Hepatic Function Latest Ref Rng & Units 06/30/2017 06/28/2017 06/10/2017  Total Protein 6.5 - 8.1 g/dL 7.5 7.9 7.7  Albumin 3.5 - 5.0 g/dL 1.7(L) 2.2(L) 2.9(L)  AST 15 - 41 U/L 38 115(H) 23  ALT 17 - 63 U/L 40 73(H) 32  Alk Phosphatase 38 - 126 U/L 406(H) 474(H) 150(H)  Total Bilirubin 0.3 - 1.2 mg/dL 1.7(H) 3.3(H) 0.9    PT/INR  Recent Labs  06/30/17 0709  LABPROT 15.5*  INR 1.24   ABG No results for input(s): PHART, HCO3 in the last 72 hours.  Invalid input(s): PCO2, PO2  Studies/Results: Dg Chest 2 View  Result Date: 06/28/2017 CLINICAL DATA:  Shortness of breath.  Cough . EXAM: CHEST  2 VIEW COMPARISON:  06/10/2017. FINDINGS: Mediastinum hilar structures normal. Cardiomegaly with mild pulmonary vascular congestion. No overt pulmonary edema. Mild left base subsegmental atelectasis. No prominent pleural effusion. Pleural thickening noted bilaterally unchanged consistent scarring . IMPRESSION: 1.  Cardiomegaly with mild pulmonary venous congestion. 2. Mild left base subsegmental atelectasis. Electronically Signed   By: Marcello Moores  Register   On: 06/28/2017 14:29   Ct Abdomen Pelvis W Contrast  Result Date: 06/28/2017 CLINICAL DATA:  Initial evaluation for acute abdominal infection, right upper quadrant tenderness. EXAM: CT ABDOMEN AND PELVIS WITH CONTRAST TECHNIQUE: Multidetector CT imaging of the abdomen and pelvis was performed using the standard protocol following bolus administration of intravenous contrast. CONTRAST:  198m ISOVUE-300 IOPAMIDOL (ISOVUE-300) INJECTION 61% COMPARISON:  Prior ultrasound 08/31/2016. FINDINGS: Lower chest: Scattered bibasilar atelectatic changes. Visualized lungs otherwise clear. Hepatobiliary: Multiple ill-defined hypodense lesions seen within the liver, predominantly involving the right hepatic lobe. These are predominantly subcapsular in location. For reference purposes, a discrete lesion positioned within the right hepatic lobe measures approximately 4.0 x 3.7 cm in size (series 2, image 40). Associated bulging of the hepatic contour at a few areas with prominent perihepatic inflammatory stranding. Findings favored to reflect intrahepatic abscess ease, although metastatic disease could also be considered. Additionally, inflammatory stranding seen about the gallbladder in the gallbladder fossa. The gallbladder itself appears to be partially contracted. There is an adjacent oblong collection/lesion that measures approximately 8.2 x 3.5 x 5.8 cm (series 2, image 42), positioned adjacent to the gallbladder. This appears to be contiguous with several of the intrahepatic lesions (series 6, image 51, 46, 43).  Finding also favored to reflect abscess, although mass or adenopathy could be considered as well. Hazy inflammatory stranding extends inferiorly and laterally about the liver. No appreciable biliary dilatation. Pancreas: Pancreas within normal limits. Spleen: Spleen within  normal limits. Adrenals/Urinary Tract: Adrenal glands are normal. Kidneys equal in size with symmetric enhancement. Probable punctate nonobstructive left renal nephrolithiasis noted. No hydronephrosis or focal enhancing renal mass. No hydroureter. Partially distended bladder within normal limits. Stomach/Bowel: Stomach within normal limits. Hazy inflammatory stranding about the second- third portion of the duodenum related to the inflammatory process about the gallbladder an liver. No evidence for bowel obstruction. Appendix is normal. No other acute inflammatory changes about the bowels. Specifically, no or significant inflammatory changes about the bowels seen to serve as a source for intrahepatic seeding. Vascular/Lymphatic: Normal intravascular enhancement seen throughout the intra-abdominal aorta and its branch vessels. No adenopathy. Reproductive: Prostate normal. Other: No free intraperitoneal air. No free fluid. Sequelae of prior ventral hernia repair noted. There is a recurrent mildly complex fat containing ventral hernia at the lower margin of the mesh. Loop of small bowel closely approximates the hernia neck. Musculoskeletal: No acute osseus abnormality. No worrisome lytic or blastic osseous lesions. IMPRESSION: 1. Multiple abnormal intrahepatic lesions as above with adjacent perihepatic inflammatory stranding. Given the adjacent inflammatory changes, findings favored to reflect sequelae of infection with intrahepatic abscesses. Exact source not definitively identified on this exam, as no significant inflammatory changes are seen about the bowels themselves. Metastatic disease could also be considered, although is perhaps less favored. Correlation with percutaneous sampling would likely be helpful for further evaluation. 2. Additional 8.2 x 3.5 x 5.8 cm collection/lesion adjacent to the gallbladder as above, favored to be contiguous with a few of the peripheral intrahepatic lesions. Finding also favored  to reflect abscess. The adjacent gallbladder is contracted with hazy inflammatory stranding in the gallbladder fossa. Under clear whether these inflammatory changes are related to adjacent infection/abscess or possibly concomitant cholecystitis. No biliary dilatation identified. 3. Sequelae of prior hernia repair with recurrent fat containing ventral hernia. Electronically Signed   By: Jeannine Boga M.D.   On: 06/28/2017 20:39    Anti-infectives: Anti-infectives    Start     Dose/Rate Route Frequency Ordered Stop   06/30/17 0500  vancomycin (VANCOCIN) 1,500 mg in sodium chloride 0.9 % 500 mL IVPB     1,500 mg 250 mL/hr over 120 Minutes Intravenous Every 12 hours 06/29/17 1657     06/29/17 1700  vancomycin (VANCOCIN) 2,500 mg in sodium chloride 0.9 % 500 mL IVPB     2,500 mg 250 mL/hr over 120 Minutes Intravenous  Once 06/29/17 1654 06/29/17 1900   06/29/17 0430  piperacillin-tazobactam (ZOSYN) IVPB 3.375 g     3.375 g 12.5 mL/hr over 240 Minutes Intravenous Every 8 hours 06/29/17 0042     06/29/17 0013  metroNIDAZOLE (FLAGYL) IVPB 500 mg  Status:  Discontinued     500 mg 100 mL/hr over 60 Minutes Intravenous Every 8 hours 06/29/17 0013 06/29/17 1146   06/28/17 2100  metroNIDAZOLE (FLAGYL) IVPB 500 mg     500 mg 100 mL/hr over 60 Minutes Intravenous  Once 06/28/17 2049 06/28/17 2214   06/28/17 2015  piperacillin-tazobactam (ZOSYN) IVPB 3.375 g     3.375 g 100 mL/hr over 30 Minutes Intravenous  Once 06/28/17 2008 06/28/17 2101      Assessment/Plan: HTN Diabetes mellitus - A1c 13.4, glucose in the 200s ?UTI - per primary team  Liver abscess Questionable Cholecystitis -  CT: multiple intrahepatic lesions with inflammatory stranding, abscess adjacent to the gallbladder 8x3.5x6 cm approx., recurrent fat containing ventral hernia - WBC 18.8, afebrile - LFT's slightly improved - no indications for emergent surgical intervention - Continue IV abx - have consulted IR for  percutaneous drainage  FEN: NPO  VTE: SCDs, lovenox ID: Zosyn (10/23>>), Flagyl (10/23>>)  Plan: IR consulted for drainage.Continue IV abx. AM labs   LOS: 2 days    Shakeisha Horine K. 06/30/2017

## 2017-06-30 NOTE — Consult Note (Signed)
[facility]    Date of Admission:  06/28/2017   Total days of antibiotics 3        Day 3 Zosyn        Day 1 Vanc        Reason for Consult: Bacteremia / Liver abscess   Referring Provider: Carolin Sicks Primary Care Provider: [name], PA-C  Assessment/Plan: 51 y/o male with Strep bacteremia on 2/2 blood cultures and 2 hepatic abscesses with possible cholecystitis s/p percutaneous drain placement. Purulent drainage noted during drain insertion. Repeat blood cultures from 10/24 are in process.   - Discontinue vancomycin and Zoysn. Start ceftriaxone and oral metronidazole which will cover for strep bacteremia and anaerobes. Will likely need 2 weeks of IV antibiotics for bacteremia.   - Health maintenance -  HIV / Hep C is negative.   Diabetes - A1c of 13.1 indicating poor control. Continue management per primary.  Active Problems:   Hepatic abscess   Abscess, hepatic   Acute cholecystitis   Type 2 diabetes mellitus without complication, without long-term current use of insulin (HCC)   Bacteremia due to Gram-positive bacteria   Scheduled Meds: . fentaNYL      . fentaNYL      . HYDROmorphone      . insulin aspart  0-9 Units Subcutaneous TID WC  . insulin glargine  10 Units Subcutaneous QHS  . lidocaine-EPINEPHrine      . lidocaine-EPINEPHrine      . meperidine      . metoprolol tartrate  25 mg Oral BID  . metroNIDAZOLE  500 mg Oral Q8H  . midazolam      . midazolam       Continuous Infusions: . sodium chloride 1 mL (06/30/17 1153)  . cefTRIAXone (ROCEPHIN)  IV     PRN Meds:.acetaminophen **OR** acetaminophen, HYDROmorphone (DILAUDID) injection  HPI: [name] is a 51 y.o. male with PMH of hypertension, gout and diabetes who presented to the ED on 10/23 following an appointment with his PCP for abnormal blood work. Experiencing abdominal pain, fatigue, chills, sweating and about 30 pounds of unintentional weight loss over the past few  weeks-months. Pain located in the RUQ with radiation to the back and shoulders described as sharp and constant with worsening of symptoms in the last 3 weeks. No chest pain or shortness of breath. Denies fevers. Labs showed leukocytosis of 21,000 with ANC of 16.7. AST was elevated at 115 and ALT of 73. Alk phosphatase was 474 and glucose was 239. Chest x-ray with cardiomegaly with mild pulmonary venous congestion and mild base subsegmental atelectasis. CT abdomen and pelvis with multiple abnormal intrahepatic lesions involving the right hepatic lobe. Questionable sequela of infection with intrahepatic abscesses with the exact source unknown. Lesions measure 4.0 x 3.7 cm in size and a lesion adjacent to the gall bladder measuring 8.2x3.6x5.8 cm were noted. Findings per radiology favored abscess. He was empirically started on Zosyn. General surgery recommends percutaneous drain and antibiotics. IR planned aspiration and drainage today. WBC count today down to 18.8 with LFTs improving with AST 38 and ALT 40. Alk phos remains elevated at 406. Blood cultures on 10/23 positive for Streptococcus species. Repeat cultures on 10/24 in process. Has remained afebrile.   Review of Systems: Review of Systems  Constitutional: Positive for chills. Negative for fever.  Cardiovascular: Negative for chest pain.  Gastrointestinal: Positive for abdominal pain, constipation and nausea. Negative for diarrhea and vomiting.  Musculoskeletal: Negative for  back pain.  Neurological: Negative for weakness and headaches.    Past Medical History:  Diagnosis Date  . Arthritis   . Diabetes mellitus without complication ([place])    diagnosed about age 71  . Gout   . Hypertension    boarderline    Social History  Substance Use Topics  . Smoking status: Former Smoker    Packs/day: 0.50    Years: 5.00    Types: Cigarettes    Quit date: 8  . Smokeless tobacco: Never Used  . Alcohol use No    Family History  Problem  Relation Age of Onset  . Cancer Mother   . Diabetes Mother   . Heart failure Father   . Hypertension Father   . Diabetes Father    No Known Allergies  OBJECTIVE: Blood pressure (!) 178/104, pulse (!) 127, temperature 98.6 F (37 C), temperature source Oral, resp. rate (!) 28, height _0  (1.702 m), weight (!) 375 lb 12.8 oz (170.5 kg), SpO2 97 %.  Physical Exam  Constitutional: He is well-developed, well-nourished, and in no distress. No distress.  Cardiovascular: Normal rate, regular rhythm, normal heart sounds and intact distal pulses.  Exam reveals no gallop and no friction rub.   No murmur heard. Pulmonary/Chest: Effort normal and breath sounds normal. No respiratory distress. He has no wheezes. He has no rales.  Abdominal: Soft. Bowel sounds are normal. There is tenderness. There is no rebound and no guarding.  Percutaneous drains in place and patent.  Skin: Skin is warm and dry. No rash noted.    Lab Results Lab Results  Component Value Date   WBC 18.8 (H) 06/30/2017   HGB 11.8 (L) 06/30/2017   HCT 36.2 (L) 06/30/2017   MCV 82.3 06/30/2017   PLT 584 (H) 06/30/2017    Lab Results  Component Value Date   CREATININE 0.80 06/30/2017   BUN <5 (L) 06/30/2017   NA 133 (L) 06/30/2017   K 3.7 06/30/2017   CL 96 (L) 06/30/2017   CO2 27 06/30/2017    Lab Results  Component Value Date   ALT 40 06/30/2017   AST 38 06/30/2017   ALKPHOS 406 (H) 06/30/2017   BILITOT 1.7 (H) 06/30/2017     Microbiology: Recent Results (from the past 240 hour(s))  Urine culture     Status: None   Collection Time: 06/28/17  6:28 PM  Result Value Ref Range Status   Specimen Description URINE, RANDOM  Final   Special Requests NONE  Final   Culture   Final    NO GROWTH Performed at [facility], [place] [address] [place], [place] [address]    Report Status 06/30/2017 FINAL  Final  Blood culture (routine x 2)     Status: None (Preliminary result)   Collection Time: 06/28/17  6:49 PM    Result Value Ref Range Status   Specimen Description RIGHT ANTECUBITAL  Final   Special Requests   Final    BOTTLES DRAWN AEROBIC AND ANAEROBIC Blood Culture adequate volume   Culture  Setup Time   Final    GRAM POSITIVE COCCI Gram Stain Report Called to,Read Back By and Verified With: ANGELICA DAVIS. @ 0929 ON [id number] BY HENDERSON L. ANAEROBIC AEROBIC Gram Stain Report Called to,Read Back By and Verified With: MIZE,J @ 1930 ON 10.24.18 BY BOWMAN,L CRITICAL RESULT CALLED TO, READ BACK BY AND VERIFIED WITH: J.LEDFORD PHARMD 06/30/17 0207 L.CHAMPION Performed at [facility], [place],  [place] [address]    Culture GRAM POSITIVE COCCI  Final   Report Status PENDING  Incomplete  Blood culture (routine x 2)     Status: None (Preliminary result)   Collection Time: 06/28/17  6:49 PM  Result Value Ref Range Status   Specimen Description BLOOD RIGHT HAND  Final   Special Requests   Final    BOTTLES DRAWN AEROBIC AND ANAEROBIC Blood Culture adequate volume   Culture  Setup Time   Final    GRAM POSITIVE COCCI Gram Stain Report Called to,Read Back By and Verified With: ANGELICA DAVIS @ 7989 ON [id number] BY HENDERSON L. ANAEROBIC AEROBIC Gram Stain Report Called to,Read Back By and Verified With: MIZE, J @ 1930 ON 10.24.18 BY BOWMAN,L    Culture NO GROWTH 2 DAYS  Final   Report Status PENDING  Incomplete  Blood Culture ID Panel (Reflexed)     Status: Abnormal   Collection Time: 06/28/17  6:49 PM  Result Value Ref Range Status   Enterococcus species NOT DETECTED NOT DETECTED Final   Listeria monocytogenes NOT DETECTED NOT DETECTED Final   Staphylococcus species NOT DETECTED NOT DETECTED Final   Staphylococcus aureus NOT DETECTED NOT DETECTED Final   Streptococcus species DETECTED (A) NOT DETECTED Final    Comment: Not Enterococcus species, Streptococcus agalactiae, Streptococcus pyogenes, or Streptococcus pneumoniae. CRITICAL RESULT CALLED TO, READ BACK BY AND VERIFIED  WITH: J.LEDFORD PHARMD 06/30/17 0207 L.CHAMPION    Streptococcus agalactiae NOT DETECTED NOT DETECTED Final   Streptococcus pneumoniae NOT DETECTED NOT DETECTED Final   Streptococcus pyogenes NOT DETECTED NOT DETECTED Final   Acinetobacter baumannii NOT DETECTED NOT DETECTED Final   Enterobacteriaceae species NOT DETECTED NOT DETECTED Final   Enterobacter cloacae complex NOT DETECTED NOT DETECTED Final   Escherichia coli NOT DETECTED NOT DETECTED Final   Klebsiella oxytoca NOT DETECTED NOT DETECTED Final   Klebsiella pneumoniae NOT DETECTED NOT DETECTED Final   Proteus species NOT DETECTED NOT DETECTED Final   Serratia marcescens NOT DETECTED NOT DETECTED Final   Haemophilus influenzae NOT DETECTED NOT DETECTED Final   Neisseria meningitidis NOT DETECTED NOT DETECTED Final   Pseudomonas aeruginosa NOT DETECTED NOT DETECTED Final   Candida albicans NOT DETECTED NOT DETECTED Final   Candida glabrata NOT DETECTED NOT DETECTED Final   Candida krusei NOT DETECTED NOT DETECTED Final   Candida parapsilosis NOT DETECTED NOT DETECTED Final   Candida tropicalis NOT DETECTED NOT DETECTED Final    Comment: Performed at [facility], [place] [address] [place], [place] [address]  MRSA PCR Screening     Status: None   Collection Time: 06/29/17  4:12 AM  Result Value Ref Range Status   MRSA by PCR NEGATIVE NEGATIVE Final    Comment:        The GeneXpert MRSA Assay (FDA approved for NASAL specimens only), is one component of a comprehensive MRSA colonization surveillance program. It is not intended to diagnose MRSA infection nor to guide or monitor treatment for MRSA infections.    Terri Piedra, NP [facility] Pager [phone] Cell  06/30/2017, 1:20 PM

## 2017-06-30 NOTE — Progress Notes (Signed)
PROGRESS NOTE    [name]  DTO:[id number] DOB: 05-Apr-1966 DOA: 06/28/2017 PCP: [name], PA-C  Outpatient Specialists:     Brief Narrative:  Patient is 51 y.o. male who presented to the [facility] ED with abdominal pain, fatigue, and weight loss.  Labs were significant for leukocytosis, mild anemia, abnormal LFTs, and hyperglycemia.  CXR show mild pulmonary venous congestion and cardiomegaly.  CT abdomen/pelvis showed multiple intrahepatic lesions with perihepatic inflammatory stranding and fluid collection around the gallbladder with thickening of the gallbladder wall.  General surgery was consulted who recommended patient be transferred to [facility].  Started flagyl and zosyn on 10/23 per pharmacy consult, flagyl discontinued 10/24.  General surgery evaluated patient 10/24: emergent cholecystectomy not currently indicated, will follow clinically.  IR was consulted: percutaneous catheter placed today.  Hep panel negative.   Assessment & Plan:   Active Problems:   Hepatic abscess   Abscess, hepatic   Acute cholecystitis   Type 2 diabetes mellitus without complication, without long-term current use of insulin ([place])   1. Hepatic Abscess -CT abdomen/pelvis showed multiple intrahepatic lesions with perihepatic inflammatory stranding - Alk phos 406 and T bili 1.7 on 10/25 -Hepatic panel (10/23) negative -Blood culture (10/23) grows gram positive cocci, sensitivity report pending -Continue IV Zosyn -Percutaneous catheter placed today by IR -Continue to monitor LFTs  2. Type 2 Diabetes Mellitus -Hemoglobin A1c 13.4 on 10/23 -CBG 204 this morning.  Home metformin (1043m BID) held due to hepatic injury. -Continue sliding scale Novolog -Continue carb modified diet -Continue CBG monitoring  3. Anemia -Hemoglobin 10.6 this morning, receiving IV fluids -Continue to monitor  4. Cholecystitis -Per general surgery, emergent surgery not indicated at this time -Continue to  monitor clinically    DVT prophylaxis: Lovenox, SCDs Code Status: Full Family Communication: Spoke with patient Disposition Plan: Discharge home when stable s/p drain placement   Consultants:   General surgery  Interventional radiology  Procedures:    CT guided percutaneous catheter placement (10/25)  Antimicrobials:   IV Flagyl (10/23 - 10/24)   IV Zosyn (10/23 - )   Subjective: Patient states abdominal pain, worse at umbilicus, is a little better today.  Continues to be hungry and would like to eat.  Denies nausea/vomiting, chest pain, SOB.  Objective: Vitals:   06/30/17 1105 06/30/17 1110 06/30/17 1115 06/30/17 1121  BP: (!) 177/117 (!) 214/182 (!) 233/171 (!) 156/117  Pulse: (!) 105 (!) 111 (!) 115 (!) 113  Resp: (!) 35 (!) 39 (!) 39 18  Temp:      TempSrc:      SpO2: 96% 100% 96% 96%  Weight:      Height:        Intake/Output Summary (Last 24 hours) at 06/30/17 1124 Last data filed at 06/30/17 0734  Gross per 24 hour  Intake             1730 ml  Output              750 ml  Net              980 ml   Filed Weights   06/28/17 1535 06/29/17 0403  Weight: (!) 170.1 kg (375 lb) (!) 170.5 kg (375 lb 12.8 oz)    Examination:  General exam: Laying in hospital bed in no acute distress.  Respiratory system: Clear to auscultation bilaterally, decreased BS due to body habitus.  Respiratory effort normal. Cardiovascular system: S1 & S2 heard, RRR. No JVD, murmurs,  rubs, gallops or clicks. No pedal edema. Gastrointestinal system: Abdomen is obese, mildly distended.  Tender to palpation, worse at umbilicus and RUQ.  No organomegaly or masses felt. Normal bowel sounds heard. Central nervous system: Alert and oriented. No focal neurological deficits. Extremities: Symmetric 5 x 5 power. Skin: No rashes, lesions or ulcers Psychiatry: Judgement and insight appear normal. Mood & affect appropriate.     Data Reviewed: I have personally reviewed following labs and  imaging studies  CBC:  Recent Labs Lab 06/28/17 1232 06/29/17 0730 06/30/17 0709  WBC 21.9* 17.7* 18.8*  NEUTROABS 16.7*  --   --   HGB 11.9* 10.6* 11.8*  HCT 36.8* 33.4* 36.2*  MCV 82.5 81.5 82.3  PLT 491* 487* 409*   Basic Metabolic Panel:  Recent Labs Lab 06/28/17 1232 06/29/17 0730 06/30/17 0709  NA 138  --  133*  K 4.2  --  3.7  CL 101  --  96*  CO2 26  --  27  GLUCOSE 239*  --  219*  BUN 7  --  <5*  CREATININE 0.71 0.69 0.80  CALCIUM 8.9  --  8.2*   GFR: Estimated Creatinine Clearance: 164.8 mL/min (by C-G formula based on SCr of 0.8 mg/dL). Liver Function Tests:  Recent Labs Lab 06/28/17 1232 06/30/17 0709  AST 115* 38  ALT 73* 40  ALKPHOS 474* 406*  BILITOT 3.3* 1.7*  PROT 7.9 7.5  ALBUMIN 2.2* 1.7*    Recent Labs Lab 06/28/17 1849  LIPASE 20   No results for input(s): AMMONIA in the last 168 hours. Coagulation Profile:  Recent Labs Lab 06/30/17 0709  INR 1.24   Cardiac Enzymes: No results for input(s): CKTOTAL, CKMB, CKMBINDEX, TROPONINI in the last 168 hours. BNP (last 3 results) No results for input(s): PROBNP in the last 8760 hours. HbA1C:  Recent Labs  06/28/17 1232  HGBA1C 13.4*   CBG:  Recent Labs Lab 06/29/17 0754 06/29/17 1229 06/29/17 1650 06/29/17 2103 06/30/17 0812  GLUCAP 159* 155* 231* 224* 204*   Lipid Profile:  Recent Labs  06/28/17 1232  CHOL 154  HDL 10*  LDLCALC 96  TRIG 238*  CHOLHDL 15.4   Thyroid Function Tests:  Recent Labs  06/28/17 1232  TSH 0.532   Anemia Panel: No results for input(s): VITAMINB12, FOLATE, FERRITIN, TIBC, IRON, RETICCTPCT in the last 72 hours. Urine analysis:    Component Value Date/Time   COLORURINE AMBER (A) 06/28/2017 1828   APPEARANCEUR CLEAR 06/28/2017 1828   LABSPEC 1.040 (H) 06/28/2017 1828   PHURINE 6.0 06/28/2017 1828   GLUCOSEU 50 (A) 06/28/2017 1828   HGBUR NEGATIVE 06/28/2017 1828   BILIRUBINUR SMALL (A) 06/28/2017 1828   BILIRUBINUR LARGE  06/28/2017 1155   [place] 06/28/2017 1828   PROTEINUR 30 (A) 06/28/2017 1828   UROBILINOGEN 2.0 (A) 06/28/2017 1155   NITRITE NEGATIVE 06/28/2017 1828   LEUKOCYTESUR NEGATIVE 06/28/2017 1828   Sepsis Labs: '@LABRCNTIP'$ (procalcitonin:4,lacticidven:4)  ) Recent Results (from the past 240 hour(s))  Urine culture     Status: None   Collection Time: 06/28/17  6:28 PM  Result Value Ref Range Status   Specimen Description URINE, RANDOM  Final   Special Requests NONE  Final   Culture   Final    NO GROWTH Performed at [facility], [place] [address], [place], [place] [address]    Report Status 06/30/2017 FINAL  Final  Blood culture (routine x 2)     Status: None (Preliminary result)   Collection  Time: 06/28/17  6:49 PM  Result Value Ref Range Status   Specimen Description RIGHT ANTECUBITAL  Final   Special Requests   Final    BOTTLES DRAWN AEROBIC AND ANAEROBIC Blood Culture adequate volume   Culture  Setup Time   Final    GRAM POSITIVE COCCI Gram Stain Report Called to,Read Back By and Verified With: ANGELICA DAVIS. @ 0093 ON [id number] BY HENDERSON L. ANAEROBIC AEROBIC Gram Stain Report Called to,Read Back By and Verified With: MIZE,J @ 1930 ON 10.24.18 BY BOWMAN,L CRITICAL RESULT CALLED TO, READ BACK BY AND VERIFIED WITH: J.LEDFORD PHARMD 06/30/17 0207 L.CHAMPION Performed at [facility], [place] [address] [place], [place] [address]    Culture GRAM POSITIVE COCCI  Final   Report Status PENDING  Incomplete  Blood culture (routine x 2)     Status: None (Preliminary result)   Collection Time: 06/28/17  6:49 PM  Result Value Ref Range Status   Specimen Description BLOOD RIGHT HAND  Final   Special Requests   Final    BOTTLES DRAWN AEROBIC AND ANAEROBIC Blood Culture adequate volume   Culture  Setup Time   Final    GRAM POSITIVE COCCI Gram Stain Report Called to,Read Back By and Verified With: ANGELICA DAVIS @ 8938 ON [id number] BY HENDERSON L. ANAEROBIC AEROBIC Gram  Stain Report Called to,Read Back By and Verified With: MIZE, J @ 1930 ON 10.24.18 BY BOWMAN,L    Culture NO GROWTH 2 DAYS  Final   Report Status PENDING  Incomplete  Blood Culture ID Panel (Reflexed)     Status: Abnormal   Collection Time: 06/28/17  6:49 PM  Result Value Ref Range Status   Enterococcus species NOT DETECTED NOT DETECTED Final   Listeria monocytogenes NOT DETECTED NOT DETECTED Final   Staphylococcus species NOT DETECTED NOT DETECTED Final   Staphylococcus aureus NOT DETECTED NOT DETECTED Final   Streptococcus species DETECTED (A) NOT DETECTED Final    Comment: Not Enterococcus species, Streptococcus agalactiae, Streptococcus pyogenes, or Streptococcus pneumoniae. CRITICAL RESULT CALLED TO, READ BACK BY AND VERIFIED WITH: J.LEDFORD PHARMD 06/30/17 0207 L.CHAMPION    Streptococcus agalactiae NOT DETECTED NOT DETECTED Final   Streptococcus pneumoniae NOT DETECTED NOT DETECTED Final   Streptococcus pyogenes NOT DETECTED NOT DETECTED Final   Acinetobacter baumannii NOT DETECTED NOT DETECTED Final   Enterobacteriaceae species NOT DETECTED NOT DETECTED Final   Enterobacter cloacae complex NOT DETECTED NOT DETECTED Final   Escherichia coli NOT DETECTED NOT DETECTED Final   Klebsiella oxytoca NOT DETECTED NOT DETECTED Final   Klebsiella pneumoniae NOT DETECTED NOT DETECTED Final   Proteus species NOT DETECTED NOT DETECTED Final   Serratia marcescens NOT DETECTED NOT DETECTED Final   Haemophilus influenzae NOT DETECTED NOT DETECTED Final   Neisseria meningitidis NOT DETECTED NOT DETECTED Final   Pseudomonas aeruginosa NOT DETECTED NOT DETECTED Final   Candida albicans NOT DETECTED NOT DETECTED Final   Candida glabrata NOT DETECTED NOT DETECTED Final   Candida krusei NOT DETECTED NOT DETECTED Final   Candida parapsilosis NOT DETECTED NOT DETECTED Final   Candida tropicalis NOT DETECTED NOT DETECTED Final    Comment: Performed at [facility], [place] [address]  [place], [place] [address]  MRSA PCR Screening     Status: None   Collection Time: 06/29/17  4:12 AM  Result Value Ref Range Status   MRSA by PCR NEGATIVE NEGATIVE Final    Comment:        The GeneXpert MRSA  Assay (FDA approved for NASAL specimens only), is one component of a comprehensive MRSA colonization surveillance program. It is not intended to diagnose MRSA infection nor to guide or monitor treatment for MRSA infections.          Radiology Studies: Dg Chest 2 View  Result Date: 06/28/2017 CLINICAL DATA:  Shortness of breath.  Cough . EXAM: CHEST  2 VIEW COMPARISON:  06/10/2017. FINDINGS: Mediastinum hilar structures normal. Cardiomegaly with mild pulmonary vascular congestion. No overt pulmonary edema. Mild left base subsegmental atelectasis. No prominent pleural effusion. Pleural thickening noted bilaterally unchanged consistent scarring . IMPRESSION: 1. Cardiomegaly with mild pulmonary venous congestion. 2. Mild left base subsegmental atelectasis. Electronically Signed   By: Marcello Moores  Register   On: 06/28/2017 14:29   Ct Abdomen Pelvis W Contrast  Result Date: 06/28/2017 CLINICAL DATA:  Initial evaluation for acute abdominal infection, right upper quadrant tenderness. EXAM: CT ABDOMEN AND PELVIS WITH CONTRAST TECHNIQUE: Multidetector CT imaging of the abdomen and pelvis was performed using the standard protocol following bolus administration of intravenous contrast. CONTRAST:  144m ISOVUE-300 IOPAMIDOL (ISOVUE-300) INJECTION 61% COMPARISON:  Prior ultrasound 08/31/2016. FINDINGS: Lower chest: Scattered bibasilar atelectatic changes. Visualized lungs otherwise clear. Hepatobiliary: Multiple ill-defined hypodense lesions seen within the liver, predominantly involving the right hepatic lobe. These are predominantly subcapsular in location. For reference purposes, a discrete lesion positioned within the right hepatic lobe measures approximately 4.0 x 3.7 cm in size (series 2, image 40).  Associated bulging of the hepatic contour at a few areas with prominent perihepatic inflammatory stranding. Findings favored to reflect intrahepatic abscess ease, although metastatic disease could also be considered. Additionally, inflammatory stranding seen about the gallbladder in the gallbladder fossa. The gallbladder itself appears to be partially contracted. There is an adjacent oblong collection/lesion that measures approximately 8.2 x 3.5 x 5.8 cm (series 2, image 42), positioned adjacent to the gallbladder. This appears to be contiguous with several of the intrahepatic lesions (series 6, image 51, 46, 43). Finding also favored to reflect abscess, although mass or adenopathy could be considered as well. Hazy inflammatory stranding extends inferiorly and laterally about the liver. No appreciable biliary dilatation. Pancreas: Pancreas within normal limits. Spleen: Spleen within normal limits. Adrenals/Urinary Tract: Adrenal glands are normal. Kidneys equal in size with symmetric enhancement. Probable punctate nonobstructive left renal nephrolithiasis noted. No hydronephrosis or focal enhancing renal mass. No hydroureter. Partially distended bladder within normal limits. Stomach/Bowel: Stomach within normal limits. Hazy inflammatory stranding about the second- third portion of the duodenum related to the inflammatory process about the gallbladder an liver. No evidence for bowel obstruction. Appendix is normal. No other acute inflammatory changes about the bowels. Specifically, no or significant inflammatory changes about the bowels seen to serve as a source for intrahepatic seeding. Vascular/Lymphatic: Normal intravascular enhancement seen throughout the intra-abdominal aorta and its branch vessels. No adenopathy. Reproductive: Prostate normal. Other: No free intraperitoneal air. No free fluid. Sequelae of prior ventral hernia repair noted. There is a recurrent mildly complex fat containing ventral hernia at the  lower margin of the mesh. Loop of small bowel closely approximates the hernia neck. Musculoskeletal: No acute osseus abnormality. No worrisome lytic or blastic osseous lesions. IMPRESSION: 1. Multiple abnormal intrahepatic lesions as above with adjacent perihepatic inflammatory stranding. Given the adjacent inflammatory changes, findings favored to reflect sequelae of infection with intrahepatic abscesses. Exact source not definitively identified on this exam, as no significant inflammatory changes are seen about the bowels themselves. Metastatic disease could also be considered, although  is perhaps less favored. Correlation with percutaneous sampling would likely be helpful for further evaluation. 2. Additional 8.2 x 3.5 x 5.8 cm collection/lesion adjacent to the gallbladder as above, favored to be contiguous with a few of the peripheral intrahepatic lesions. Finding also favored to reflect abscess. The adjacent gallbladder is contracted with hazy inflammatory stranding in the gallbladder fossa. Under clear whether these inflammatory changes are related to adjacent infection/abscess or possibly concomitant cholecystitis. No biliary dilatation identified. 3. Sequelae of prior hernia repair with recurrent fat containing ventral hernia. Electronically Signed   By: Jeannine Boga M.D.   On: 06/28/2017 20:39        Scheduled Meds: . fentaNYL      . fentaNYL      . HYDROmorphone      . Influenza vac split quadrivalent PF  0.5 mL Intramuscular Tomorrow-1000  . insulin aspart  0-9 Units Subcutaneous TID WC  . lidocaine-EPINEPHrine      . lidocaine-EPINEPHrine      . meperidine      . midazolam      . midazolam      . midazolam       Continuous Infusions: . sodium chloride Stopped (06/29/17 1311)  . piperacillin-tazobactam (ZOSYN)  IV Stopped (06/30/17 0734)  . vancomycin Stopped (06/30/17 0708)     LOS: 2 days     [place]ania Hall-Potvin, PA-S [facility] Pager [phone]  If  7PM-7AM, please contact night-coverage [email or web address] Password [email or web address] 06/30/2017, 11:24 AM

## 2017-06-30 NOTE — Procedures (Signed)
Pre procedural Dx: Hepatic Abscesses Post procedural Dx: Same  Technically successful CT guided placed of a 10 Fr drainage catheter placement into the dominant abscess within the superior lateral aspect of the right lobe of the liver yielding 15 cc of purulent material.   Technically successful CT guided placed of a 10 Fr drainage catheter placement into the dominant abscess within the more inferior anterior aspect of the right lobe of the liver yielding 35 cc of purulent material.    A sample of aspirated fluid was sent to the laboratory for analysis.    EBL: Minimal  Complications: Patient developed rigors following placement of both drains.   Ronny Bacon, MD Pager #: [phone]  '

## 2017-06-30 NOTE — Sedation Documentation (Signed)
Reports pain 8/10

## 2017-06-30 NOTE — Progress Notes (Signed)
Inpatient Diabetes Program Recommendations  AACE/ADA: New Consensus Statement on Inpatient Glycemic Control (2015)  Target Ranges:  Prepandial:   less than 140 mg/dL      Peak postprandial:   less than 180 mg/dL (1-2 hours)      Critically ill patients:  140 - 180 mg/dL   Consult for patient being new to insulin at time of d/c. Patient not feeling well at the time of my visit, patient says he is very tired groggy almost. Will see patient tomorrow and walk through insulin pen and vial/syringe methods of insulin administration. Will also give patient [employer] handout of [employer] products. Saw patient yesterday and talked at length with him and is willing to do insulin and what is necessary right now in order to get better.  Thanks,  Tama Headings RN, MSN, [facility] Inpatient Diabetes Coordinator Team Pager [phone] (8a-5p)

## 2017-06-30 NOTE — Progress Notes (Signed)
PHARMACY - PHYSICIAN COMMUNICATION CRITICAL VALUE ALERT - BLOOD CULTURE IDENTIFICATION (BCID)  Results for orders placed or performed during the hospital encounter of 06/28/17  Blood Culture ID Panel (Reflexed) (Collected: 06/28/2017  6:49 PM)  Result Value Ref Range   Enterococcus species NOT DETECTED NOT DETECTED   Listeria monocytogenes NOT DETECTED NOT DETECTED   Staphylococcus species NOT DETECTED NOT DETECTED   Staphylococcus aureus NOT DETECTED NOT DETECTED   Streptococcus species DETECTED (A) NOT DETECTED   Streptococcus agalactiae NOT DETECTED NOT DETECTED   Streptococcus pneumoniae NOT DETECTED NOT DETECTED   Streptococcus pyogenes NOT DETECTED NOT DETECTED   Acinetobacter baumannii NOT DETECTED NOT DETECTED   Enterobacteriaceae species NOT DETECTED NOT DETECTED   Enterobacter cloacae complex NOT DETECTED NOT DETECTED   Escherichia coli NOT DETECTED NOT DETECTED   Klebsiella oxytoca NOT DETECTED NOT DETECTED   Klebsiella pneumoniae NOT DETECTED NOT DETECTED   Proteus species NOT DETECTED NOT DETECTED   Serratia marcescens NOT DETECTED NOT DETECTED   Haemophilus influenzae NOT DETECTED NOT DETECTED   Neisseria meningitidis NOT DETECTED NOT DETECTED   Pseudomonas aeruginosa NOT DETECTED NOT DETECTED   Candida albicans NOT DETECTED NOT DETECTED   Candida glabrata NOT DETECTED NOT DETECTED   Candida krusei NOT DETECTED NOT DETECTED   Candida parapsilosis NOT DETECTED NOT DETECTED   Candida tropicalis NOT DETECTED NOT DETECTED    Name of physician (or Provider) Contacted: Dr. Hal Hope   Changes to prescribed antibiotics required: Cont vancomycin/zosyn for now, consider de-escalation to ceftriaxone soon  Narda Bonds 06/30/2017  2:19 AM

## 2017-07-01 ENCOUNTER — Inpatient Hospital Stay (HOSPITAL_COMMUNITY): Payer: Self-pay

## 2017-07-01 DIAGNOSIS — R197 Diarrhea, unspecified: Secondary | ICD-10-CM

## 2017-07-01 DIAGNOSIS — R7881 Bacteremia: Secondary | ICD-10-CM

## 2017-07-01 LAB — CBC
HCT: 34 % — ABNORMAL LOW (ref 39.0–52.0)
Hemoglobin: 10.7 g/dL — ABNORMAL LOW (ref 13.0–17.0)
MCH: 25.7 pg — ABNORMAL LOW (ref 26.0–34.0)
MCHC: 31.5 g/dL (ref 30.0–36.0)
MCV: 81.7 fL (ref 78.0–100.0)
PLATELETS: 517 10*3/uL — AB (ref 150–400)
RBC: 4.16 MIL/uL — AB (ref 4.22–5.81)
RDW: 15.1 % (ref 11.5–15.5)
WBC: 33.1 10*3/uL — AB (ref 4.0–10.5)

## 2017-07-01 LAB — MAGNESIUM: MAGNESIUM: 1.9 mg/dL (ref 1.7–2.4)

## 2017-07-01 LAB — GLUCOSE, CAPILLARY
GLUCOSE-CAPILLARY: 208 mg/dL — AB (ref 65–99)
GLUCOSE-CAPILLARY: 218 mg/dL — AB (ref 65–99)
GLUCOSE-CAPILLARY: 198 mg/dL — AB (ref 65–99)
Glucose-Capillary: 213 mg/dL — ABNORMAL HIGH (ref 65–99)

## 2017-07-01 LAB — BASIC METABOLIC PANEL
ANION GAP: 11 (ref 5–15)
BUN: 8 mg/dL (ref 6–20)
CALCIUM: 8.1 mg/dL — AB (ref 8.9–10.3)
CO2: 23 mmol/L (ref 22–32)
Chloride: 101 mmol/L (ref 101–111)
Creatinine, Ser: 0.86 mg/dL (ref 0.61–1.24)
Glucose, Bld: 232 mg/dL — ABNORMAL HIGH (ref 65–99)
POTASSIUM: 3.7 mmol/L (ref 3.5–5.1)
SODIUM: 135 mmol/L (ref 135–145)

## 2017-07-01 LAB — C DIFFICILE QUICK SCREEN W PCR REFLEX
C DIFFICILE (CDIFF) INTERP: NOT DETECTED
C DIFFICILE (CDIFF) TOXIN: NEGATIVE
C DIFFICLE (CDIFF) ANTIGEN: NEGATIVE

## 2017-07-01 LAB — ECHOCARDIOGRAM COMPLETE
Height: 67 in
WEIGHTICAEL: 6012.8 [oz_av]

## 2017-07-01 MED ORDER — HYDROMORPHONE HCL 1 MG/ML IJ SOLN
1.0000 mg | Freq: Once | INTRAMUSCULAR | Status: DC
  Filled 2017-07-01: qty 1

## 2017-07-01 MED ORDER — ENOXAPARIN SODIUM 40 MG/0.4ML ~~LOC~~ SOLN
40.0000 mg | Freq: Every day | SUBCUTANEOUS | Status: AC
  Administered 2017-07-01 – 2017-07-10 (×10): 40 mg via SUBCUTANEOUS
  Filled 2017-07-01 (×10): qty 0.4

## 2017-07-01 MED ORDER — INSULIN GLARGINE 100 UNIT/ML ~~LOC~~ SOLN
15.0000 [IU] | Freq: Every day | SUBCUTANEOUS | Status: DC
  Administered 2017-07-01: 15 [IU] via SUBCUTANEOUS
  Filled 2017-07-01: qty 0.15

## 2017-07-01 MED ORDER — PERFLUTREN LIPID MICROSPHERE
1.0000 mL | INTRAVENOUS | Status: AC | PRN
  Administered 2017-07-01: 4.5 mL via INTRAVENOUS
  Filled 2017-07-01: qty 10

## 2017-07-01 NOTE — Progress Notes (Signed)
[facility]    Date of Admission:  06/28/2017   Total days of antibiotics 4   ID: [name] is a 51 y.o. male with hepatic abscess c/b strep group F bacteremia Active Problems:   Hepatic abscess   Abscess, hepatic   Acute cholecystitis   Type 2 diabetes mellitus without complication, without long-term current use of insulin (HCC)   Bacteremia due to Gram-positive bacteria    Subjective: "feeling rough" he reports chills. Some right sided abdominal pain with his drains  Medications:  .  HYDROmorphone (DILAUDID) injection  1 mg Intravenous Once  . insulin aspart  0-9 Units Subcutaneous TID WC  . insulin glargine  10 Units Subcutaneous QHS  . metroNIDAZOLE  500 mg Oral Q8H    Objective: Vital signs in last 24 hours: Temp:  [97.1 F (36.2 C)-99.2 F (37.3 C)] 97.7 F (36.5 C) (10/26 1154) Pulse Rate:  [47-115] 82 (10/26 1154) Resp:  [19-35] 19 (10/26 1154) BP: (83-124)/(52-76) 119/74 (10/26 1154) SpO2:  [94 %-98 %] 97 % (10/26 1154) Physical Exam  Constitutional: He is oriented to person, place, and time. He appears well-developed and well-nourished. No distress.  HENT:  Mouth/Throat: Oropharynx is clear and moist. No oropharyngeal exudate.  Cardiovascular: Normal rate, regular rhythm and normal heart sounds. Exam reveals no gallop and no friction rub.  No murmur heard.  Pulmonary/Chest: Effort normal and breath sounds normal. No respiratory distress. He has no wheezes.  Abdominal: Soft. Bowel sounds are normal. mildly distension. There is no tenderness. 2 drains in place with serosanginous fluid in jp drains Neurological: He is alert and oriented to person, place, and time.  Skin: Skin is warm and dry. No rash noted. No erythema.  Psychiatric: He has a normal mood and affect. His behavior is normal.    Lab Results  Recent Labs  06/30/17 0709 07/01/17 0318  WBC 18.8* 33.1*  HGB 11.8* 10.7*  HCT 36.2* 34.0*  NA 133* 135  K 3.7 3.7    CL 96* 101  CO2 27 23  BUN <5* 8  CREATININE 0.80 0.86   Liver Panel  Recent Labs  06/30/17 0709  PROT 7.5  ALBUMIN 1.7*  AST 38  ALT 40  ALKPHOS 406*  BILITOT 1.7*   No results found for: ESRSEDRATE, POCTSEDRATE  Microbiology: 10/23 blood cx x 2 sets strep+ 10/24 blood cx pending Studies/Results: Ct Image Guided Drainage By Percutaneous Catheter  Result Date: 06/30/2017 INDICATION: History of hepatic abscesses. Please perform ultrasound/CT-guided hepatic abscess drainage catheter placement. EXAM: ULTRASOUND AND CT-GUIDED HEPATIC ABSCESS DRAINAGE CATHETER PLACEMENT x2 COMPARISON:  CT abdomen pelvis - 06/28/2017 MEDICATIONS: The patient is currently admitted to the hospital and receiving intravenous antibiotics. The antibiotics were administered within an appropriate time frame prior to the initiation of the procedure. ANESTHESIA/SEDATION: Moderate (conscious) sedation was employed during this procedure. A total of Versed 7 mg and Fentanyl 400 mcg, Dilaudid 2 mg IV and Demerol 50 mg IV was administered intravenously. Moderate Sedation Time: 25 minutes. The patient's level of consciousness and vital signs were monitored continuously by radiology nursing throughout the procedure under my direct supervision. CONTRAST:  None COMPLICATIONS: SIR LEVEL B - Normal therapy, includes overnight admission for observation. The patient developed rigors following placement of both hepatic abscess drainage catheters which was treated with the administration of 50 mg of Demerol IV. PROCEDURE: Informed written consent was obtained from the patient after a discussion of the risks, benefits and alternatives to treatment.  The patient was placed supine on the CT gantry and a pre procedural CT was performed re-demonstrating the known hepatic abscesses with dominant ill-defined component within the superolateral aspect the right lobe of the liver measuring approximately 5.3 x 4.9 cm (image 40, series 2),  additional ill-defined component with the subcapsular inferior aspect of the right lobe of the liver measuring approximately 5.2 x 4.4 cm (image 63, series 2) an ill-defined slightly hyperattenuating fluid collection within the porta hepatis measuring approximately 8.5 x 4.4 cm (image 66, series 2). All 3 of these fluid collections were identified sonographically. The right superolateral abdomen as well as the midline of the upper abdomen was prepped and draped in usual sterile fashion. The overlying soft tissues were anesthetized with 1% lidocaine with epinephrine. Under direct ultrasound guidance, each hypoechoic collection were sequentially accessed with an 18 gauge trocar needle. Both hepatic samples yielded the aspiration of a small amount of purulent fluid, while the porta hepatis collection yielded a small amount of bilious appearing fluid. Short Amplatz wires were placed at all locations. CT scanning demonstrated appropriate positioning of the Amplatz wires within both hepatic abscesses, however the wire was not appropriately coiled within the porta hepatis collection. Next, both hepatic abscess drainage catheters were placed after the tracks were serially dilated allowing placement of a 10 [place] all-purpose drainage catheters. As patient was complaining of discomfort lying on the table and then developed rigors, additional intervention was not performed at this time to attempt placement of a drainage catheter into the perihepatic abscess. Limited postprocedural scanning was performed demonstrating appropriate positioning of both hepatic abscess drainage catheters. Approximately 15 cc of purulent fluid was aspirated from the superolateral abscess while approximately 35 cc was aspirated from the abscess within the anterior aspect the right lobe of the liver. Both drainage catheters were secured at the skin entrance site within interrupted suture and connected to JP bulbs. Dressings were placed. IMPRESSION:  1. Successful ultrasound and CT-guided drainage catheter placement into dominant abscess within the superolateral aspect the right lobe of the liver yielding 15 cc of purulent material. 2. Successful ultrasound and CT-guided drainage catheter placement into dominant abscess within the anterior inferior aspect the right lobe of the liver yielding 35 cc of purulent material. 3. Attempted though ultimately unsuccessful placement of a percutaneous drainage catheter into the ill-defined suspected abscess within the porta hepatis abscess, secondary to patient's inability to tolerate the procedure as well as development of rigors following placement of the 2 additional hepatic abscess drainage catheters. 4. A representative sample of aspirated purulent material was sent to the laboratory for analysis. PLAN: - As the suspected abscess within the dome of the right lobe of the liver and potential abscess within the porta hepatis remain undrained, would have a low threshold to repeat an IV only abdominal CT if patient has infectious symptoms do not defervesce within the next several days, however note, both collections may be NOT amenable to image guided drainage secondary to location and patient body habitus. Electronically Signed   By: Sandi Mariscal M.D.   On: 06/30/2017 14:55   Ct Image Guided Drainage By Percutaneous Catheter  Result Date: 06/30/2017 INDICATION: History of hepatic abscesses. Please perform ultrasound/CT-guided hepatic abscess drainage catheter placement. EXAM: ULTRASOUND AND CT-GUIDED HEPATIC ABSCESS DRAINAGE CATHETER PLACEMENT x2 COMPARISON:  CT abdomen pelvis - 06/28/2017 MEDICATIONS: The patient is currently admitted to the hospital and receiving intravenous antibiotics. The antibiotics were administered within an appropriate time frame prior to the initiation of the  procedure. ANESTHESIA/SEDATION: Moderate (conscious) sedation was employed during this procedure. A total of Versed 7 mg and Fentanyl  400 mcg, Dilaudid 2 mg IV and Demerol 50 mg IV was administered intravenously. Moderate Sedation Time: 25 minutes. The patient's level of consciousness and vital signs were monitored continuously by radiology nursing throughout the procedure under my direct supervision. CONTRAST:  None COMPLICATIONS: SIR LEVEL B - Normal therapy, includes overnight admission for observation. The patient developed rigors following placement of both hepatic abscess drainage catheters which was treated with the administration of 50 mg of Demerol IV. PROCEDURE: Informed written consent was obtained from the patient after a discussion of the risks, benefits and alternatives to treatment. The patient was placed supine on the CT gantry and a pre procedural CT was performed re-demonstrating the known hepatic abscesses with dominant ill-defined component within the superolateral aspect the right lobe of the liver measuring approximately 5.3 x 4.9 cm (image 40, series 2), additional ill-defined component with the subcapsular inferior aspect of the right lobe of the liver measuring approximately 5.2 x 4.4 cm (image 63, series 2) an ill-defined slightly hyperattenuating fluid collection within the porta hepatis measuring approximately 8.5 x 4.4 cm (image 66, series 2). All 3 of these fluid collections were identified sonographically. The right superolateral abdomen as well as the midline of the upper abdomen was prepped and draped in usual sterile fashion. The overlying soft tissues were anesthetized with 1% lidocaine with epinephrine. Under direct ultrasound guidance, each hypoechoic collection were sequentially accessed with an 18 gauge trocar needle. Both hepatic samples yielded the aspiration of a small amount of purulent fluid, while the porta hepatis collection yielded a small amount of bilious appearing fluid. Short Amplatz wires were placed at all locations. CT scanning demonstrated appropriate positioning of the Amplatz wires within  both hepatic abscesses, however the wire was not appropriately coiled within the porta hepatis collection. Next, both hepatic abscess drainage catheters were placed after the tracks were serially dilated allowing placement of a 10 [place] all-purpose drainage catheters. As patient was complaining of discomfort lying on the table and then developed rigors, additional intervention was not performed at this time to attempt placement of a drainage catheter into the perihepatic abscess. Limited postprocedural scanning was performed demonstrating appropriate positioning of both hepatic abscess drainage catheters. Approximately 15 cc of purulent fluid was aspirated from the superolateral abscess while approximately 35 cc was aspirated from the abscess within the anterior aspect the right lobe of the liver. Both drainage catheters were secured at the skin entrance site within interrupted suture and connected to JP bulbs. Dressings were placed. IMPRESSION: 1. Successful ultrasound and CT-guided drainage catheter placement into dominant abscess within the superolateral aspect the right lobe of the liver yielding 15 cc of purulent material. 2. Successful ultrasound and CT-guided drainage catheter placement into dominant abscess within the anterior inferior aspect the right lobe of the liver yielding 35 cc of purulent material. 3. Attempted though ultimately unsuccessful placement of a percutaneous drainage catheter into the ill-defined suspected abscess within the porta hepatis abscess, secondary to patient's inability to tolerate the procedure as well as development of rigors following placement of the 2 additional hepatic abscess drainage catheters. 4. A representative sample of aspirated purulent material was sent to the laboratory for analysis. PLAN: - As the suspected abscess within the dome of the right lobe of the liver and potential abscess within the porta hepatis remain undrained, would have a low threshold to repeat  an IV only abdominal  CT if patient has infectious symptoms do not defervesce within the next several days, however note, both collections may be NOT amenable to image guided drainage secondary to location and patient body habitus. Electronically Signed   By: Sandi Mariscal M.D.   On: 06/30/2017 14:55     Assessment/Plan:  Hepatic abscess with secondary streptococcal bacteremia = continue with ceftriaxone 2gm IV daily. Await culture ID from the hepatic abscess fluid  Strep bacteremia = likely related to #1. Recommend to get TTE to start evaluation for endocarditis. Has repeat blood cx pending  Leukocytosis = likely reactive from IR drainage. Continue to monitor. Would repeat blood cx if he spikes fever.  Dr Linus Salmons to see tomorrow  Baxter Flattery [facility] Cell: [phone] Pager: [phone]  07/01/2017, 1:32 PM

## 2017-07-01 NOTE — Progress Notes (Signed)
Subjective/Chief Complaint: Feeling somewhat better, tol PO   Objective: Vital signs in last 24 hours: Temp:  [97.1 F (36.2 C)-99.2 F (37.3 C)] 98.1 F (36.7 C) (10/26 0400) Pulse Rate:  [47-132] 47 (10/26 0700) Resp:  [18-39] 19 (10/26 0700) BP: (83-233)/(52-182) 124/76 (10/26 0700) SpO2:  [94 %-100 %] 98 % (10/26 0700) Last BM Date: 07/01/17  Intake/Output from previous day: 10/25 0701 - 10/26 0700 In: 1080 [P.O.:480; IV Piggyback:600] Out: 1315 [Urine:1275; Drains:40] Intake/Output this shift: Total I/O In: -  Out: 150 [Urine:150]  General appearance: alert and cooperative Resp: clear to auscultation bilaterally Cardio: S1, S2 normal GI: soft, tender RUQ, JP purulent SS  Lab Results:   Recent Labs  06/30/17 0709 07/01/17 0318  WBC 18.8* 33.1*  HGB 11.8* 10.7*  HCT 36.2* 34.0*  PLT 584* 517*   BMET  Recent Labs  06/30/17 0709 07/01/17 0318  NA 133* 135  K 3.7 3.7  CL 96* 101  CO2 27 23  GLUCOSE 219* 232*  BUN <5* 8  CREATININE 0.80 0.86  CALCIUM 8.2* 8.1*   PT/INR  Recent Labs  06/30/17 0709  LABPROT 15.5*  INR 1.24   ABG No results for input(s): PHART, HCO3 in the last 72 hours.  Invalid input(s): PCO2, PO2  Studies/Results: Ct Image Guided Drainage By Percutaneous Catheter  Result Date: 06/30/2017 INDICATION: History of hepatic abscesses. Please perform ultrasound/CT-guided hepatic abscess drainage catheter placement. EXAM: ULTRASOUND AND CT-GUIDED HEPATIC ABSCESS DRAINAGE CATHETER PLACEMENT x2 COMPARISON:  CT abdomen pelvis - 06/28/2017 MEDICATIONS: The patient is currently admitted to the hospital and receiving intravenous antibiotics. The antibiotics were administered within an appropriate time frame prior to the initiation of the procedure. ANESTHESIA/SEDATION: Moderate (conscious) sedation was employed during this procedure. A total of Versed 7 mg and Fentanyl 400 mcg, Dilaudid 2 mg IV and Demerol 50 mg IV was administered  intravenously. Moderate Sedation Time: 25 minutes. The patient's level of consciousness and vital signs were monitored continuously by radiology nursing throughout the procedure under my direct supervision. CONTRAST:  None COMPLICATIONS: SIR LEVEL B - Normal therapy, includes overnight admission for observation. The patient developed rigors following placement of both hepatic abscess drainage catheters which was treated with the administration of 50 mg of Demerol IV. PROCEDURE: Informed written consent was obtained from the patient after a discussion of the risks, benefits and alternatives to treatment. The patient was placed supine on the CT gantry and a pre procedural CT was performed re-demonstrating the known hepatic abscesses with dominant ill-defined component within the superolateral aspect the right lobe of the liver measuring approximately 5.3 x 4.9 cm (image 40, series 2), additional ill-defined component with the subcapsular inferior aspect of the right lobe of the liver measuring approximately 5.2 x 4.4 cm (image 63, series 2) an ill-defined slightly hyperattenuating fluid collection within the porta hepatis measuring approximately 8.5 x 4.4 cm (image 66, series 2). All 3 of these fluid collections were identified sonographically. The right superolateral abdomen as well as the midline of the upper abdomen was prepped and draped in usual sterile fashion. The overlying soft tissues were anesthetized with 1% lidocaine with epinephrine. Under direct ultrasound guidance, each hypoechoic collection were sequentially accessed with an 18 gauge trocar needle. Both hepatic samples yielded the aspiration of a small amount of purulent fluid, while the porta hepatis collection yielded a small amount of bilious appearing fluid. Short Amplatz wires were placed at all locations. CT scanning demonstrated appropriate positioning of the Amplatz wires within  both hepatic abscesses, however the wire was not appropriately  coiled within the porta hepatis collection. Next, both hepatic abscess drainage catheters were placed after the tracks were serially dilated allowing placement of a 10 [place] all-purpose drainage catheters. As patient was complaining of discomfort lying on the table and then developed rigors, additional intervention was not performed at this time to attempt placement of a drainage catheter into the perihepatic abscess. Limited postprocedural scanning was performed demonstrating appropriate positioning of both hepatic abscess drainage catheters. Approximately 15 cc of purulent fluid was aspirated from the superolateral abscess while approximately 35 cc was aspirated from the abscess within the anterior aspect the right lobe of the liver. Both drainage catheters were secured at the skin entrance site within interrupted suture and connected to JP bulbs. Dressings were placed. IMPRESSION: 1. Successful ultrasound and CT-guided drainage catheter placement into dominant abscess within the superolateral aspect the right lobe of the liver yielding 15 cc of purulent material. 2. Successful ultrasound and CT-guided drainage catheter placement into dominant abscess within the anterior inferior aspect the right lobe of the liver yielding 35 cc of purulent material. 3. Attempted though ultimately unsuccessful placement of a percutaneous drainage catheter into the ill-defined suspected abscess within the porta hepatis abscess, secondary to patient's inability to tolerate the procedure as well as development of rigors following placement of the 2 additional hepatic abscess drainage catheters. 4. A representative sample of aspirated purulent material was sent to the laboratory for analysis. PLAN: - As the suspected abscess within the dome of the right lobe of the liver and potential abscess within the porta hepatis remain undrained, would have a low threshold to repeat an IV only abdominal CT if patient has infectious symptoms do  not defervesce within the next several days, however note, both collections may be NOT amenable to image guided drainage secondary to location and patient body habitus. Electronically Signed   By: Sandi Mariscal M.D.   On: 06/30/2017 14:55   Ct Image Guided Drainage By Percutaneous Catheter  Result Date: 06/30/2017 INDICATION: History of hepatic abscesses. Please perform ultrasound/CT-guided hepatic abscess drainage catheter placement. EXAM: ULTRASOUND AND CT-GUIDED HEPATIC ABSCESS DRAINAGE CATHETER PLACEMENT x2 COMPARISON:  CT abdomen pelvis - 06/28/2017 MEDICATIONS: The patient is currently admitted to the hospital and receiving intravenous antibiotics. The antibiotics were administered within an appropriate time frame prior to the initiation of the procedure. ANESTHESIA/SEDATION: Moderate (conscious) sedation was employed during this procedure. A total of Versed 7 mg and Fentanyl 400 mcg, Dilaudid 2 mg IV and Demerol 50 mg IV was administered intravenously. Moderate Sedation Time: 25 minutes. The patient's level of consciousness and vital signs were monitored continuously by radiology nursing throughout the procedure under my direct supervision. CONTRAST:  None COMPLICATIONS: SIR LEVEL B - Normal therapy, includes overnight admission for observation. The patient developed rigors following placement of both hepatic abscess drainage catheters which was treated with the administration of 50 mg of Demerol IV. PROCEDURE: Informed written consent was obtained from the patient after a discussion of the risks, benefits and alternatives to treatment. The patient was placed supine on the CT gantry and a pre procedural CT was performed re-demonstrating the known hepatic abscesses with dominant ill-defined component within the superolateral aspect the right lobe of the liver measuring approximately 5.3 x 4.9 cm (image 40, series 2), additional ill-defined component with the subcapsular inferior aspect of the right lobe of  the liver measuring approximately 5.2 x 4.4 cm (image 63, series 2) an ill-defined slightly  hyperattenuating fluid collection within the porta hepatis measuring approximately 8.5 x 4.4 cm (image 66, series 2). All 3 of these fluid collections were identified sonographically. The right superolateral abdomen as well as the midline of the upper abdomen was prepped and draped in usual sterile fashion. The overlying soft tissues were anesthetized with 1% lidocaine with epinephrine. Under direct ultrasound guidance, each hypoechoic collection were sequentially accessed with an 18 gauge trocar needle. Both hepatic samples yielded the aspiration of a small amount of purulent fluid, while the porta hepatis collection yielded a small amount of bilious appearing fluid. Short Amplatz wires were placed at all locations. CT scanning demonstrated appropriate positioning of the Amplatz wires within both hepatic abscesses, however the wire was not appropriately coiled within the porta hepatis collection. Next, both hepatic abscess drainage catheters were placed after the tracks were serially dilated allowing placement of a 10 [place] all-purpose drainage catheters. As patient was complaining of discomfort lying on the table and then developed rigors, additional intervention was not performed at this time to attempt placement of a drainage catheter into the perihepatic abscess. Limited postprocedural scanning was performed demonstrating appropriate positioning of both hepatic abscess drainage catheters. Approximately 15 cc of purulent fluid was aspirated from the superolateral abscess while approximately 35 cc was aspirated from the abscess within the anterior aspect the right lobe of the liver. Both drainage catheters were secured at the skin entrance site within interrupted suture and connected to JP bulbs. Dressings were placed. IMPRESSION: 1. Successful ultrasound and CT-guided drainage catheter placement into dominant abscess  within the superolateral aspect the right lobe of the liver yielding 15 cc of purulent material. 2. Successful ultrasound and CT-guided drainage catheter placement into dominant abscess within the anterior inferior aspect the right lobe of the liver yielding 35 cc of purulent material. 3. Attempted though ultimately unsuccessful placement of a percutaneous drainage catheter into the ill-defined suspected abscess within the porta hepatis abscess, secondary to patient's inability to tolerate the procedure as well as development of rigors following placement of the 2 additional hepatic abscess drainage catheters. 4. A representative sample of aspirated purulent material was sent to the laboratory for analysis. PLAN: - As the suspected abscess within the dome of the right lobe of the liver and potential abscess within the porta hepatis remain undrained, would have a low threshold to repeat an IV only abdominal CT if patient has infectious symptoms do not defervesce within the next several days, however note, both collections may be NOT amenable to image guided drainage secondary to location and patient body habitus. Electronically Signed   By: Sandi Mariscal M.D.   On: 06/30/2017 14:55    Anti-infectives: Anti-infectives    Start     Dose/Rate Route Frequency Ordered Stop   06/30/17 2000  cefTRIAXone (ROCEPHIN) 2 g in dextrose 5 % 50 mL IVPB     2 g 100 mL/hr over 30 Minutes Intravenous Every 24 hours 06/30/17 1304     06/30/17 2000  metroNIDAZOLE (FLAGYL) tablet 500 mg     500 mg Oral Every 8 hours 06/30/17 1304     06/30/17 0500  vancomycin (VANCOCIN) 1,500 mg in sodium chloride 0.9 % 500 mL IVPB  Status:  Discontinued     1,500 mg 250 mL/hr over 120 Minutes Intravenous Every 12 hours 06/29/17 1657 06/30/17 1304   06/29/17 1700  vancomycin (VANCOCIN) 2,500 mg in sodium chloride 0.9 % 500 mL IVPB     2,500 mg 250 mL/hr over 120 Minutes  Intravenous  Once 06/29/17 1654 06/29/17 1900   06/29/17 0430   piperacillin-tazobactam (ZOSYN) IVPB 3.375 g  Status:  Discontinued     3.375 g 12.5 mL/hr over 240 Minutes Intravenous Every 8 hours 06/29/17 0042 06/30/17 1304   06/29/17 0013  metroNIDAZOLE (FLAGYL) IVPB 500 mg  Status:  Discontinued     500 mg 100 mL/hr over 60 Minutes Intravenous Every 8 hours 06/29/17 0013 06/29/17 1146   06/28/17 2100  metroNIDAZOLE (FLAGYL) IVPB 500 mg     500 mg 100 mL/hr over 60 Minutes Intravenous  Once 06/28/17 2049 06/28/17 2214   06/28/17 2015  piperacillin-tazobactam (ZOSYN) IVPB 3.375 g     3.375 g 100 mL/hr over 30 Minutes Intravenous  Once 06/28/17 2008 06/28/17 2101      Assessment/Plan: HTN Diabetes mellitus - A1c 13.4, glucose in the 200s ?UTI - per primary team  Liver abscess Questionable Cholecystitis - CT: multiple intrahepatic lesions with inflammatory stranding, abscess adjacent to the gallbladder 8x3.5x6 cm approx., recurrent fat containing ventral hernia - S/P perc drain by IR Dr. Pascal Lux 10/25 - appreciate his help - WBC up to 33.1 (not unexpected post drain) - re-check LFT's 10/27 - no indications for emergent surgical intervention - Continue IV abx   FEN: NPO  VTE: SCDs, lovenox ID: Zosyn (10/23>>), Flagyl (10/23>>)  Plan: IR consulted for drainage.Continue IV abx. AM labs  LOS: 3 days    Danean Marner E 07/01/2017

## 2017-07-01 NOTE — Progress Notes (Signed)
Inpatient Diabetes Program Recommendations  AACE/ADA: New Consensus Statement on Inpatient Glycemic Control (2015)  Target Ranges:  Prepandial:   less than 140 mg/dL      Peak postprandial:   less than 180 mg/dL (1-2 hours)      Critically ill patients:  140 - 180 mg/dL   Spoke with patient today and showed him how to draw up insulin and administer insulin injections via Vial/Syringe. Patient return demonstrated. Patient is practicing technique with vial of saline at bedside. Spoke with patient about continuing to monitor glucose levels and based on glucose trend may need to call PCP for adjustments.   Discussed hypoglycemia and treatment.  Thanks,  Tama Headings RN, MSN, [facility] Inpatient Diabetes Coordinator Team Pager [phone] (8a-5p)

## 2017-07-01 NOTE — Progress Notes (Signed)
PROGRESS NOTE    [name]  RWE:[id number] DOB: 1966/08/01 DOA: 06/28/2017 PCP: [name], PA-C  Outpatient Specialists:     Brief Narrative:  Patient is 51 y.o. male w/ PMHx significant for T2DM, HTN, arthritis presented to [facility] ED with abdominal pain, fatigue, and weight loss.  Labs were significant for leukocytosis, mild anemia, abnormal LFTs, and hyperglycemia.  CXR show mild pulmonary venous congestion and cardiomegaly.  CT abdomen/pelvis showed multiple intrahepatic lesions with perihepatic inflammatory stranding and fluid collection around the gallbladder with thickening of the gallbladder wall.  General surgery was consulted who recommended patient be transferred to [facility].  Started flagyl and zosyn on 10/23 per pharmacy consult, flagyl discontinued 10/24.  General surgery evaluated patient 10/24 - felt emergent cholecystectomy was not indicated at that time, recommended following clinically.  IR was consulted - two percutaneous drains placed yesterday.  Hep panel negative.   Assessment & Plan:   Active Problems:   Hepatic abscess   Abscess, hepatic   Acute cholecystitis   Type 2 diabetes mellitus without complication, without long-term current use of insulin (HCC)   Bacteremia due to Gram-positive bacteria   1. Intrahepatic Abscesses -CT abdomen/pelvis showed multiple intrahepatic lesions with perihepatic inflammatory stranding -Blood culture (10/23) grows gram positive cocci, sensitivity report pending.  Discontinued IV Zosyn and IV ceftriaxone started on 10/25. -Percutaneous drains (2) placed on 10/25 by IR -Alk phos 406 and T bili 1.7 on 10/25 -Hepatic panel (10/23) negative  -Continue to monitor LFTs  2. Streptococcus bacteremia -Blood culture (10/23) growing streptococcus group F.  IV antibiotics switched from Zosyn to ceftriaxone. -Stool study due to WBC spike (33.1 today) and recent diarrhea.  PO metronidazole started 10/25.  3. Type 2 Diabetes  Mellitus -Hemoglobin A1c 13.4 on 10/23.  CBG 218 this morning.  -Continue sliding scale Novolog and carb modified diet -Continue CBG monitoring  3. Normocytic anemia of unknown etiology -Iron profile (10/25) - low iron and TIBC, increased ferritin.  Iron deficient - question of nutritional vs. infectious status.  Avoiding supplemental iron in setting of bacteremia. -Hemoglobin 10.7 this morning -Continue to monitor  4. Acute cholecystitis on [place] -Per general surgery consult emergent cholecystectomy not warranted. -Will continue to monitor clinically    DVT prophylaxis: SCDs, ambulation Code Status: Full Family Communication: Spoke with patient Disposition Plan: Discharge home when stable    Consultants:   General surgery  Interventional radiology  Procedures:    CT guided percutaneous catheter placement (10/25)  Antimicrobials:   IV Flagyl (10/23 - 10/24)   IV Zosyn (10/23 - 10/25)  IV Vancomycin (10/24)  PO Metronidazole (10/25 - )  IV Ceftriaxone (10/25 - )   Subjective: Patient feels better today.  Sore at drain sites.  Patient states he is ready to go home.  Eating breakfast during exam.  Tolerating food/liquids well.  Reports two episodes of diarrhea today.  Denies nausea/vomiting, chest pain, SOB.  Objective: Vitals:   07/01/17 0000 07/01/17 0300 07/01/17 0400 07/01/17 0700  BP: (!) 110/55 116/66 121/69 124/76  Pulse: 83 77 79 (!) 47  Resp: (!) 28 (!) 24 (!) 23 19  Temp: 99.2 F (37.3 C) 98 F (36.7 C) 98.1 F (36.7 C)   TempSrc:  Oral Oral   SpO2:  97% 97% 98%  Weight:      Height:        Intake/Output Summary (Last 24 hours) at 07/01/17 1142 Last data filed at 07/01/17 0749  Gross per 24 hour  Intake              480 ml  Output             1465 ml  Net             -985 ml   Filed Weights   06/28/17 1535 06/29/17 0403  Weight: (!) 170.1 kg (375 lb) (!) 170.5 kg (375 lb 12.8 oz)    Examination:  General exam: Sitting in hospital chair  in no acute distress.  Respiratory system: Clear to auscultation bilaterally, diminished BS bilaterally due to body habitus.  Respiratory effort normal. Cardiovascular system: S1 & S2 heard, RRR. No JVD, murmurs, rubs, gallops or clicks. No pedal edema. Gastrointestinal system: Abdomen is obese, mildly distended.  Tenderness around both drain sites in RUQ.  Drain exit sites are dressed - clean, dry, intact.  Normal bowel sounds heard. Central nervous system: Alert and oriented. No focal neurological deficits. Extremities: Symmetric 5 x 5 power. Skin: No rashes, lesions or ulcers Psychiatry: Judgement and insight appear normal. Mood & affect appropriate.     Data Reviewed: I have personally reviewed following labs and imaging studies  CBC:  Recent Labs Lab 06/28/17 1232 06/29/17 0730 06/30/17 0709 07/01/17 0318  WBC 21.9* 17.7* 18.8* 33.1*  NEUTROABS 16.7*  --   --   --   HGB 11.9* 10.6* 11.8* 10.7*  HCT 36.8* 33.4* 36.2* 34.0*  MCV 82.5 81.5 82.3 81.7  PLT 491* 487* 584* 570*   Basic Metabolic Panel:  Recent Labs Lab 06/28/17 1232 06/29/17 0730 06/30/17 0709 07/01/17 0318  NA 138  --  133* 135  K 4.2  --  3.7 3.7  CL 101  --  96* 101  CO2 26  --  27 23  GLUCOSE 239*  --  219* 232*  BUN 7  --  <5* 8  CREATININE 0.71 0.69 0.80 0.86  CALCIUM 8.9  --  8.2* 8.1*  MG  --   --   --  1.9   GFR: Estimated Creatinine Clearance: 153.3 mL/min (by C-G formula based on SCr of 0.86 mg/dL). Liver Function Tests:  Recent Labs Lab 06/28/17 1232 06/30/17 0709  AST 115* 38  ALT 73* 40  ALKPHOS 474* 406*  BILITOT 3.3* 1.7*  PROT 7.9 7.5  ALBUMIN 2.2* 1.7*    Recent Labs Lab 06/28/17 1849  LIPASE 20   No results for input(s): AMMONIA in the last 168 hours. Coagulation Profile:  Recent Labs Lab 06/30/17 0709  INR 1.24   Cardiac Enzymes: No results for input(s): CKTOTAL, CKMB, CKMBINDEX, TROPONINI in the last 168 hours. BNP (last 3 results) No results for  input(s): PROBNP in the last 8760 hours. HbA1C:  Recent Labs  06/28/17 1232  HGBA1C 13.4*   CBG:  Recent Labs Lab 06/30/17 1201 06/30/17 1627 06/30/17 2001 07/01/17 0348 07/01/17 0848  GLUCAP 181* 174* 205* 208* 218*   Lipid Profile:  Recent Labs  06/28/17 1232  CHOL 154  HDL 10*  LDLCALC 96  TRIG 238*  CHOLHDL 15.4   Thyroid Function Tests:  Recent Labs  06/28/17 1232  TSH 0.532   Anemia Panel:  Recent Labs  06/30/17 1605  FERRITIN 1,155*  TIBC 130*  IRON 11*   Urine analysis:    Component Value Date/Time   COLORURINE AMBER (A) 06/28/2017 1828   APPEARANCEUR CLEAR 06/28/2017 1828   LABSPEC 1.040 (H) 06/28/2017 1828   PHURINE 6.0 06/28/2017 1828   GLUCOSEU 50 (A) 06/28/2017 1828  HGBUR NEGATIVE 06/28/2017 1828   BILIRUBINUR SMALL (A) 06/28/2017 1828   BILIRUBINUR LARGE 06/28/2017 [place] 06/28/2017 1828   PROTEINUR 30 (A) 06/28/2017 1828   UROBILINOGEN 2.0 (A) 06/28/2017 1155   NITRITE NEGATIVE 06/28/2017 1828   LEUKOCYTESUR NEGATIVE 06/28/2017 1828   Sepsis Labs: '@LABRCNTIP'$ (procalcitonin:4,lacticidven:4)  ) Recent Results (from the past 240 hour(s))  Urine culture     Status: None   Collection Time: 06/28/17  6:28 PM  Result Value Ref Range Status   Specimen Description URINE, RANDOM  Final   Special Requests NONE  Final   Culture   Final    NO GROWTH Performed at [facility], [place] [address], [place], [place] [address]    Report Status 06/30/2017 FINAL  Final  Blood culture (routine x 2)     Status: Abnormal (Preliminary result)   Collection Time: 06/28/17  6:49 PM  Result Value Ref Range Status   Specimen Description RIGHT ANTECUBITAL  Final   Special Requests   Final    BOTTLES DRAWN AEROBIC AND ANAEROBIC Blood Culture adequate volume   Culture  Setup Time   Final    GRAM POSITIVE COCCI Gram Stain Report Called to,Read Back By and Verified With: ANGELICA DAVIS. @ 6433 ON [id number] BY HENDERSON  L. ANAEROBIC AEROBIC Gram Stain Report Called to,Read Back By and Verified With: MIZE,J @ 1930 ON 10.24.18 BY BOWMAN,L CRITICAL RESULT CALLED TO, READ BACK BY AND VERIFIED WITH: J.LEDFORD PHARMD 06/30/17 0207 L.CHAMPION    Culture (A)  Final    STREPTOCOCCUS GROUP F CULTURE REINCUBATED FOR BETTER GROWTH Performed at [facility], [place] [address], [place], [place] [address]    Report Status PENDING  Incomplete  Blood culture (routine x 2)     Status: Abnormal (Preliminary result)   Collection Time: 06/28/17  6:49 PM  Result Value Ref Range Status   Specimen Description BLOOD RIGHT HAND  Final   Special Requests   Final    BOTTLES DRAWN AEROBIC AND ANAEROBIC Blood Culture adequate volume   Culture  Setup Time   Final    GRAM POSITIVE COCCI Gram Stain Report Called to,Read Back By and Verified With: ANGELICA DAVIS @ 0160 ON [id number] BY HENDERSON L. ANAEROBIC AEROBIC Gram Stain Report Called to,Read Back By and Verified With: MIZE, J @ 1930 ON 10.24.18 BY BOWMAN,L    Culture (A)  Final    STREPTOCOCCUS GROUP F CULTURE REINCUBATED FOR BETTER GROWTH Performed at [facility], [place] [address], [place], [place] [address]    Report Status PENDING  Incomplete  Blood Culture ID Panel (Reflexed)     Status: Abnormal   Collection Time: 06/28/17  6:49 PM  Result Value Ref Range Status   Enterococcus species NOT DETECTED NOT DETECTED Final   Listeria monocytogenes NOT DETECTED NOT DETECTED Final   Staphylococcus species NOT DETECTED NOT DETECTED Final   Staphylococcus aureus NOT DETECTED NOT DETECTED Final   Streptococcus species DETECTED (A) NOT DETECTED Final    Comment: Not Enterococcus species, Streptococcus agalactiae, Streptococcus pyogenes, or Streptococcus pneumoniae. CRITICAL RESULT CALLED TO, READ BACK BY AND VERIFIED WITH: J.LEDFORD PHARMD 06/30/17 0207 L.CHAMPION    Streptococcus agalactiae NOT DETECTED NOT DETECTED Final   Streptococcus pneumoniae NOT DETECTED NOT  DETECTED Final   Streptococcus pyogenes NOT DETECTED NOT DETECTED Final   Acinetobacter baumannii NOT DETECTED NOT DETECTED Final   Enterobacteriaceae species NOT DETECTED NOT DETECTED Final   Enterobacter cloacae complex NOT DETECTED  NOT DETECTED Final   Escherichia coli NOT DETECTED NOT DETECTED Final   Klebsiella oxytoca NOT DETECTED NOT DETECTED Final   Klebsiella pneumoniae NOT DETECTED NOT DETECTED Final   Proteus species NOT DETECTED NOT DETECTED Final   Serratia marcescens NOT DETECTED NOT DETECTED Final   Haemophilus influenzae NOT DETECTED NOT DETECTED Final   Neisseria meningitidis NOT DETECTED NOT DETECTED Final   Pseudomonas aeruginosa NOT DETECTED NOT DETECTED Final   Candida albicans NOT DETECTED NOT DETECTED Final   Candida glabrata NOT DETECTED NOT DETECTED Final   Candida krusei NOT DETECTED NOT DETECTED Final   Candida parapsilosis NOT DETECTED NOT DETECTED Final   Candida tropicalis NOT DETECTED NOT DETECTED Final    Comment: Performed at [facility], [place] [address] [place], [place] [address]  MRSA PCR Screening     Status: None   Collection Time: 06/29/17  4:12 AM  Result Value Ref Range Status   MRSA by PCR NEGATIVE NEGATIVE Final    Comment:        The GeneXpert MRSA Assay (FDA approved for NASAL specimens only), is one component of a comprehensive MRSA colonization surveillance program. It is not intended to diagnose MRSA infection nor to guide or monitor treatment for MRSA infections.   Culture, blood (routine x 2)     Status: None (Preliminary result)   Collection Time: 06/29/17  6:58 PM  Result Value Ref Range Status   Specimen Description BLOOD RIGHT HAND  Final   Special Requests   Final    BOTTLES DRAWN AEROBIC ONLY Blood Culture adequate volume   Culture NO GROWTH 2 DAYS  Final   Report Status PENDING  Incomplete  Culture, blood (routine x 2)     Status: None (Preliminary result)   Collection Time: 06/29/17  7:09 PM  Result Value Ref  Range Status   Specimen Description BLOOD RIGHT ANTECUBITAL  Final   Special Requests   Final    BOTTLES DRAWN AEROBIC AND ANAEROBIC Blood Culture adequate volume   Culture NO GROWTH 2 DAYS  Final   Report Status PENDING  Incomplete  Aerobic/Anaerobic Culture (surgical/deep wound)     Status: None (Preliminary result)   Collection Time: 06/30/17  1:31 PM  Result Value Ref Range Status   Specimen Description ABSCESS  Final   Special Requests HEPATIC ABSCESS DRAINAGE  Final   Gram Stain   Final    ABUNDANT WBC PRESENT, PREDOMINANTLY PMN ABUNDANT GRAM POSITIVE COCCI ABUNDANT GRAM POSITIVE RODS    Culture TOO YOUNG TO READ  Final   Report Status PENDING  Incomplete         Radiology Studies: Ct Image Guided Drainage By Percutaneous Catheter  Result Date: 06/30/2017 INDICATION: History of hepatic abscesses. Please perform ultrasound/CT-guided hepatic abscess drainage catheter placement. EXAM: ULTRASOUND AND CT-GUIDED HEPATIC ABSCESS DRAINAGE CATHETER PLACEMENT x2 COMPARISON:  CT abdomen pelvis - 06/28/2017 MEDICATIONS: The patient is currently admitted to the hospital and receiving intravenous antibiotics. The antibiotics were administered within an appropriate time frame prior to the initiation of the procedure. ANESTHESIA/SEDATION: Moderate (conscious) sedation was employed during this procedure. A total of Versed 7 mg and Fentanyl 400 mcg, Dilaudid 2 mg IV and Demerol 50 mg IV was administered intravenously. Moderate Sedation Time: 25 minutes. The patient's level of consciousness and vital signs were monitored continuously by radiology nursing throughout the procedure under my direct supervision. CONTRAST:  None COMPLICATIONS: SIR LEVEL B - Normal therapy, includes overnight admission for observation. The patient developed  rigors following placement of both hepatic abscess drainage catheters which was treated with the administration of 50 mg of Demerol IV. PROCEDURE: Informed written  consent was obtained from the patient after a discussion of the risks, benefits and alternatives to treatment. The patient was placed supine on the CT gantry and a pre procedural CT was performed re-demonstrating the known hepatic abscesses with dominant ill-defined component within the superolateral aspect the right lobe of the liver measuring approximately 5.3 x 4.9 cm (image 40, series 2), additional ill-defined component with the subcapsular inferior aspect of the right lobe of the liver measuring approximately 5.2 x 4.4 cm (image 63, series 2) an ill-defined slightly hyperattenuating fluid collection within the porta hepatis measuring approximately 8.5 x 4.4 cm (image 66, series 2). All 3 of these fluid collections were identified sonographically. The right superolateral abdomen as well as the midline of the upper abdomen was prepped and draped in usual sterile fashion. The overlying soft tissues were anesthetized with 1% lidocaine with epinephrine. Under direct ultrasound guidance, each hypoechoic collection were sequentially accessed with an 18 gauge trocar needle. Both hepatic samples yielded the aspiration of a small amount of purulent fluid, while the porta hepatis collection yielded a small amount of bilious appearing fluid. Short Amplatz wires were placed at all locations. CT scanning demonstrated appropriate positioning of the Amplatz wires within both hepatic abscesses, however the wire was not appropriately coiled within the porta hepatis collection. Next, both hepatic abscess drainage catheters were placed after the tracks were serially dilated allowing placement of a 10 [place] all-purpose drainage catheters. As patient was complaining of discomfort lying on the table and then developed rigors, additional intervention was not performed at this time to attempt placement of a drainage catheter into the perihepatic abscess. Limited postprocedural scanning was performed demonstrating appropriate  positioning of both hepatic abscess drainage catheters. Approximately 15 cc of purulent fluid was aspirated from the superolateral abscess while approximately 35 cc was aspirated from the abscess within the anterior aspect the right lobe of the liver. Both drainage catheters were secured at the skin entrance site within interrupted suture and connected to JP bulbs. Dressings were placed. IMPRESSION: 1. Successful ultrasound and CT-guided drainage catheter placement into dominant abscess within the superolateral aspect the right lobe of the liver yielding 15 cc of purulent material. 2. Successful ultrasound and CT-guided drainage catheter placement into dominant abscess within the anterior inferior aspect the right lobe of the liver yielding 35 cc of purulent material. 3. Attempted though ultimately unsuccessful placement of a percutaneous drainage catheter into the ill-defined suspected abscess within the porta hepatis abscess, secondary to patient's inability to tolerate the procedure as well as development of rigors following placement of the 2 additional hepatic abscess drainage catheters. 4. A representative sample of aspirated purulent material was sent to the laboratory for analysis. PLAN: - As the suspected abscess within the dome of the right lobe of the liver and potential abscess within the porta hepatis remain undrained, would have a low threshold to repeat an IV only abdominal CT if patient has infectious symptoms do not defervesce within the next several days, however note, both collections may be NOT amenable to image guided drainage secondary to location and patient body habitus. Electronically Signed   By: Sandi Mariscal M.D.   On: 06/30/2017 14:55   Ct Image Guided Drainage By Percutaneous Catheter  Result Date: 06/30/2017 INDICATION: History of hepatic abscesses. Please perform ultrasound/CT-guided hepatic abscess drainage catheter placement. EXAM: ULTRASOUND AND CT-GUIDED  HEPATIC ABSCESS  DRAINAGE CATHETER PLACEMENT x2 COMPARISON:  CT abdomen pelvis - 06/28/2017 MEDICATIONS: The patient is currently admitted to the hospital and receiving intravenous antibiotics. The antibiotics were administered within an appropriate time frame prior to the initiation of the procedure. ANESTHESIA/SEDATION: Moderate (conscious) sedation was employed during this procedure. A total of Versed 7 mg and Fentanyl 400 mcg, Dilaudid 2 mg IV and Demerol 50 mg IV was administered intravenously. Moderate Sedation Time: 25 minutes. The patient's level of consciousness and vital signs were monitored continuously by radiology nursing throughout the procedure under my direct supervision. CONTRAST:  None COMPLICATIONS: SIR LEVEL B - Normal therapy, includes overnight admission for observation. The patient developed rigors following placement of both hepatic abscess drainage catheters which was treated with the administration of 50 mg of Demerol IV. PROCEDURE: Informed written consent was obtained from the patient after a discussion of the risks, benefits and alternatives to treatment. The patient was placed supine on the CT gantry and a pre procedural CT was performed re-demonstrating the known hepatic abscesses with dominant ill-defined component within the superolateral aspect the right lobe of the liver measuring approximately 5.3 x 4.9 cm (image 40, series 2), additional ill-defined component with the subcapsular inferior aspect of the right lobe of the liver measuring approximately 5.2 x 4.4 cm (image 63, series 2) an ill-defined slightly hyperattenuating fluid collection within the porta hepatis measuring approximately 8.5 x 4.4 cm (image 66, series 2). All 3 of these fluid collections were identified sonographically. The right superolateral abdomen as well as the midline of the upper abdomen was prepped and draped in usual sterile fashion. The overlying soft tissues were anesthetized with 1% lidocaine with epinephrine. Under  direct ultrasound guidance, each hypoechoic collection were sequentially accessed with an 18 gauge trocar needle. Both hepatic samples yielded the aspiration of a small amount of purulent fluid, while the porta hepatis collection yielded a small amount of bilious appearing fluid. Short Amplatz wires were placed at all locations. CT scanning demonstrated appropriate positioning of the Amplatz wires within both hepatic abscesses, however the wire was not appropriately coiled within the porta hepatis collection. Next, both hepatic abscess drainage catheters were placed after the tracks were serially dilated allowing placement of a 10 [place] all-purpose drainage catheters. As patient was complaining of discomfort lying on the table and then developed rigors, additional intervention was not performed at this time to attempt placement of a drainage catheter into the perihepatic abscess. Limited postprocedural scanning was performed demonstrating appropriate positioning of both hepatic abscess drainage catheters. Approximately 15 cc of purulent fluid was aspirated from the superolateral abscess while approximately 35 cc was aspirated from the abscess within the anterior aspect the right lobe of the liver. Both drainage catheters were secured at the skin entrance site within interrupted suture and connected to JP bulbs. Dressings were placed. IMPRESSION: 1. Successful ultrasound and CT-guided drainage catheter placement into dominant abscess within the superolateral aspect the right lobe of the liver yielding 15 cc of purulent material. 2. Successful ultrasound and CT-guided drainage catheter placement into dominant abscess within the anterior inferior aspect the right lobe of the liver yielding 35 cc of purulent material. 3. Attempted though ultimately unsuccessful placement of a percutaneous drainage catheter into the ill-defined suspected abscess within the porta hepatis abscess, secondary to patient's inability to  tolerate the procedure as well as development of rigors following placement of the 2 additional hepatic abscess drainage catheters. 4. A representative sample of aspirated purulent material was sent  to the laboratory for analysis. PLAN: - As the suspected abscess within the dome of the right lobe of the liver and potential abscess within the porta hepatis remain undrained, would have a low threshold to repeat an IV only abdominal CT if patient has infectious symptoms do not defervesce within the next several days, however note, both collections may be NOT amenable to image guided drainage secondary to location and patient body habitus. Electronically Signed   By: Sandi Mariscal M.D.   On: 06/30/2017 14:55        Scheduled Meds: .  HYDROmorphone (DILAUDID) injection  1 mg Intravenous Once  . insulin aspart  0-9 Units Subcutaneous TID WC  . insulin glargine  10 Units Subcutaneous QHS  . metroNIDAZOLE  500 mg Oral Q8H   Continuous Infusions: . sodium chloride 1 mL (06/30/17 1153)  . cefTRIAXone (ROCEPHIN)  IV Stopped (06/30/17 2030)     LOS: 3 days     T[place]ania Hall-Potvin, PA-S [facility] Pager [phone]  If 7PM-7AM, please contact night-coverage [email or web address] Password [email or web address] 07/01/2017, 11:42 AM

## 2017-07-01 NOTE — Progress Notes (Signed)
  Echocardiogram 2D Echocardiogram with definity has been performed.  [name] M 07/01/2017, 3:47 PM

## 2017-07-01 NOTE — Progress Notes (Signed)
Referring Physician(s): Dr Jake Samples  Supervising Physician: Arne Cleveland  Patient Status:  [facility] - In-pt  Chief Complaint:  10/25: Pre procedural Dx: Hepatic Abscesses Post procedural Dx: Same Technically successful CT guided placed of a 10 Fr drainage catheter placement into the dominant abscess within the superior lateral aspect of the right lobe of the liver yielding 15 cc of purulent material.   Technically successful CT guided placed of a 10 Fr drainage catheter placement into the dominant abscess within the more inferior anterior aspect of the right lobe of the liver yielding 35 cc of purulent material.    Subjective:  Liver abscess Drains placed 10/25 Pt feels no better today OP is bloody   Allergies: Patient has no known allergies.  Medications: Prior to Admission medications   Medication Sig Start Date End Date Taking? Authorizing Provider  oxyCODONE-acetaminophen (PERCOCET) 10-325 MG tablet Take 1 tablet by mouth every 4 (four) hours as needed for pain. 06/15/17  Yes Sanjuana Kava, MD  albuterol (PROVENTIL HFA;VENTOLIN HFA) 108 (90 Base) MCG/ACT inhaler Inhale 2 puffs into the lungs every 6 (six) hours as needed for wheezing or shortness of breath. 06/28/17   [name], PA-C  benzonatate (TESSALON PERLES) 100 MG capsule 1-2 po q 8 hour prn cough Patient taking differently: Take 100-200 mg by mouth 3 (three) times daily as needed for cough. 1-2 po q 8 hour prn cough 06/28/17   [name], PA-C  cephALEXin (KEFLEX) 500 MG capsule Take 1 capsule (500 mg total) by mouth 4 (four) times daily. 06/28/17   [name], PA-C  metFORMIN (GLUCOPHAGE) 1000 MG tablet Take 1 tablet (1,000 mg total) by mouth 2 (two) times daily with a meal. 06/28/17   [name], PA-C     Vital Signs: BP 119/74 (BP Location: Right Arm)   Pulse 82   Temp 97.7 F (36.5 C) (Oral)   Resp 19   Ht 5\' 7"  (1.702 m)   Wt (!) 375 lb 12.8 oz (170.5 kg)   SpO2 97%   BMI  58.86 kg/m   Physical Exam  Constitutional: He is oriented to person, place, and time.  Abdominal: Soft. There is tenderness.  Musculoskeletal: Normal range of motion.  Neurological: He is alert and oriented to person, place, and time.  Skin: Skin is warm and dry.  Sites are clean and dry NT no bleeding OP bloody 20 cc ea yesterday; 10 cc in each JP now  Psychiatric: He has a normal mood and affect. His behavior is normal.  Nursing note and vitals reviewed.  Cx abundant wbcs and gram pos  Imaging: Dg Chest 2 View  Result Date: 06/28/2017 CLINICAL DATA:  Shortness of breath.  Cough . EXAM: CHEST  2 VIEW COMPARISON:  06/10/2017. FINDINGS: Mediastinum hilar structures normal. Cardiomegaly with mild pulmonary vascular congestion. No overt pulmonary edema. Mild left base subsegmental atelectasis. No prominent pleural effusion. Pleural thickening noted bilaterally unchanged consistent scarring . IMPRESSION: 1. Cardiomegaly with mild pulmonary venous congestion. 2. Mild left base subsegmental atelectasis. Electronically Signed   By: Marcello Moores  Register   On: 06/28/2017 14:29   Ct Abdomen Pelvis W Contrast  Result Date: 06/28/2017 CLINICAL DATA:  Initial evaluation for acute abdominal infection, right upper quadrant tenderness. EXAM: CT ABDOMEN AND PELVIS WITH CONTRAST TECHNIQUE: Multidetector CT imaging of the abdomen and pelvis was performed using the standard protocol following bolus administration of intravenous contrast. CONTRAST:  138mL ISOVUE-300 IOPAMIDOL (ISOVUE-300) INJECTION 61% COMPARISON:  Prior ultrasound 08/31/2016. FINDINGS: Lower  chest: Scattered bibasilar atelectatic changes. Visualized lungs otherwise clear. Hepatobiliary: Multiple ill-defined hypodense lesions seen within the liver, predominantly involving the right hepatic lobe. These are predominantly subcapsular in location. For reference purposes, a discrete lesion positioned within the right hepatic lobe measures  approximately 4.0 x 3.7 cm in size (series 2, image 40). Associated bulging of the hepatic contour at a few areas with prominent perihepatic inflammatory stranding. Findings favored to reflect intrahepatic abscess ease, although metastatic disease could also be considered. Additionally, inflammatory stranding seen about the gallbladder in the gallbladder fossa. The gallbladder itself appears to be partially contracted. There is an adjacent oblong collection/lesion that measures approximately 8.2 x 3.5 x 5.8 cm (series 2, image 42), positioned adjacent to the gallbladder. This appears to be contiguous with several of the intrahepatic lesions (series 6, image 51, 46, 43). Finding also favored to reflect abscess, although mass or adenopathy could be considered as well. Hazy inflammatory stranding extends inferiorly and laterally about the liver. No appreciable biliary dilatation. Pancreas: Pancreas within normal limits. Spleen: Spleen within normal limits. Adrenals/Urinary Tract: Adrenal glands are normal. Kidneys equal in size with symmetric enhancement. Probable punctate nonobstructive left renal nephrolithiasis noted. No hydronephrosis or focal enhancing renal mass. No hydroureter. Partially distended bladder within normal limits. Stomach/Bowel: Stomach within normal limits. Hazy inflammatory stranding about the second- third portion of the duodenum related to the inflammatory process about the gallbladder an liver. No evidence for bowel obstruction. Appendix is normal. No other acute inflammatory changes about the bowels. Specifically, no or significant inflammatory changes about the bowels seen to serve as a source for intrahepatic seeding. Vascular/Lymphatic: Normal intravascular enhancement seen throughout the intra-abdominal aorta and its branch vessels. No adenopathy. Reproductive: Prostate normal. Other: No free intraperitoneal air. No free fluid. Sequelae of prior ventral hernia repair noted. There is a  recurrent mildly complex fat containing ventral hernia at the lower margin of the mesh. Loop of small bowel closely approximates the hernia neck. Musculoskeletal: No acute osseus abnormality. No worrisome lytic or blastic osseous lesions. IMPRESSION: 1. Multiple abnormal intrahepatic lesions as above with adjacent perihepatic inflammatory stranding. Given the adjacent inflammatory changes, findings favored to reflect sequelae of infection with intrahepatic abscesses. Exact source not definitively identified on this exam, as no significant inflammatory changes are seen about the bowels themselves. Metastatic disease could also be considered, although is perhaps less favored. Correlation with percutaneous sampling would likely be helpful for further evaluation. 2. Additional 8.2 x 3.5 x 5.8 cm collection/lesion adjacent to the gallbladder as above, favored to be contiguous with a few of the peripheral intrahepatic lesions. Finding also favored to reflect abscess. The adjacent gallbladder is contracted with hazy inflammatory stranding in the gallbladder fossa. Under clear whether these inflammatory changes are related to adjacent infection/abscess or possibly concomitant cholecystitis. No biliary dilatation identified. 3. Sequelae of prior hernia repair with recurrent fat containing ventral hernia. Electronically Signed   By: Jeannine Boga M.D.   On: 06/28/2017 20:39   Ct Image Guided Drainage By Percutaneous Catheter  Result Date: 06/30/2017 INDICATION: History of hepatic abscesses. Please perform ultrasound/CT-guided hepatic abscess drainage catheter placement. EXAM: ULTRASOUND AND CT-GUIDED HEPATIC ABSCESS DRAINAGE CATHETER PLACEMENT x2 COMPARISON:  CT abdomen pelvis - 06/28/2017 MEDICATIONS: The patient is currently admitted to the hospital and receiving intravenous antibiotics. The antibiotics were administered within an appropriate time frame prior to the initiation of the procedure.  ANESTHESIA/SEDATION: Moderate (conscious) sedation was employed during this procedure. A total of Versed 7 mg and Fentanyl  400 mcg, Dilaudid 2 mg IV and Demerol 50 mg IV was administered intravenously. Moderate Sedation Time: 25 minutes. The patient's level of consciousness and vital signs were monitored continuously by radiology nursing throughout the procedure under my direct supervision. CONTRAST:  None COMPLICATIONS: SIR LEVEL B - Normal therapy, includes overnight admission for observation. The patient developed rigors following placement of both hepatic abscess drainage catheters which was treated with the administration of 50 mg of Demerol IV. PROCEDURE: Informed written consent was obtained from the patient after a discussion of the risks, benefits and alternatives to treatment. The patient was placed supine on the CT gantry and a pre procedural CT was performed re-demonstrating the known hepatic abscesses with dominant ill-defined component within the superolateral aspect the right lobe of the liver measuring approximately 5.3 x 4.9 cm (image 40, series 2), additional ill-defined component with the subcapsular inferior aspect of the right lobe of the liver measuring approximately 5.2 x 4.4 cm (image 63, series 2) an ill-defined slightly hyperattenuating fluid collection within the porta hepatis measuring approximately 8.5 x 4.4 cm (image 66, series 2). All 3 of these fluid collections were identified sonographically. The right superolateral abdomen as well as the midline of the upper abdomen was prepped and draped in usual sterile fashion. The overlying soft tissues were anesthetized with 1% lidocaine with epinephrine. Under direct ultrasound guidance, each hypoechoic collection were sequentially accessed with an 18 gauge trocar needle. Both hepatic samples yielded the aspiration of a small amount of purulent fluid, while the porta hepatis collection yielded a small amount of bilious appearing fluid. Short  Amplatz wires were placed at all locations. CT scanning demonstrated appropriate positioning of the Amplatz wires within both hepatic abscesses, however the wire was not appropriately coiled within the porta hepatis collection. Next, both hepatic abscess drainage catheters were placed after the tracks were serially dilated allowing placement of a 10 [place] all-purpose drainage catheters. As patient was complaining of discomfort lying on the table and then developed rigors, additional intervention was not performed at this time to attempt placement of a drainage catheter into the perihepatic abscess. Limited postprocedural scanning was performed demonstrating appropriate positioning of both hepatic abscess drainage catheters. Approximately 15 cc of purulent fluid was aspirated from the superolateral abscess while approximately 35 cc was aspirated from the abscess within the anterior aspect the right lobe of the liver. Both drainage catheters were secured at the skin entrance site within interrupted suture and connected to JP bulbs. Dressings were placed. IMPRESSION: 1. Successful ultrasound and CT-guided drainage catheter placement into dominant abscess within the superolateral aspect the right lobe of the liver yielding 15 cc of purulent material. 2. Successful ultrasound and CT-guided drainage catheter placement into dominant abscess within the anterior inferior aspect the right lobe of the liver yielding 35 cc of purulent material. 3. Attempted though ultimately unsuccessful placement of a percutaneous drainage catheter into the ill-defined suspected abscess within the porta hepatis abscess, secondary to patient's inability to tolerate the procedure as well as development of rigors following placement of the 2 additional hepatic abscess drainage catheters. 4. A representative sample of aspirated purulent material was sent to the laboratory for analysis. PLAN: - As the suspected abscess within the dome of the right  lobe of the liver and potential abscess within the porta hepatis remain undrained, would have a low threshold to repeat an IV only abdominal CT if patient has infectious symptoms do not defervesce within the next several days, however note, both collections may  be NOT amenable to image guided drainage secondary to location and patient body habitus. Electronically Signed   By: Sandi Mariscal M.D.   On: 06/30/2017 14:55   Ct Image Guided Drainage By Percutaneous Catheter  Result Date: 06/30/2017 INDICATION: History of hepatic abscesses. Please perform ultrasound/CT-guided hepatic abscess drainage catheter placement. EXAM: ULTRASOUND AND CT-GUIDED HEPATIC ABSCESS DRAINAGE CATHETER PLACEMENT x2 COMPARISON:  CT abdomen pelvis - 06/28/2017 MEDICATIONS: The patient is currently admitted to the hospital and receiving intravenous antibiotics. The antibiotics were administered within an appropriate time frame prior to the initiation of the procedure. ANESTHESIA/SEDATION: Moderate (conscious) sedation was employed during this procedure. A total of Versed 7 mg and Fentanyl 400 mcg, Dilaudid 2 mg IV and Demerol 50 mg IV was administered intravenously. Moderate Sedation Time: 25 minutes. The patient's level of consciousness and vital signs were monitored continuously by radiology nursing throughout the procedure under my direct supervision. CONTRAST:  None COMPLICATIONS: SIR LEVEL B - Normal therapy, includes overnight admission for observation. The patient developed rigors following placement of both hepatic abscess drainage catheters which was treated with the administration of 50 mg of Demerol IV. PROCEDURE: Informed written consent was obtained from the patient after a discussion of the risks, benefits and alternatives to treatment. The patient was placed supine on the CT gantry and a pre procedural CT was performed re-demonstrating the known hepatic abscesses with dominant ill-defined component within the superolateral  aspect the right lobe of the liver measuring approximately 5.3 x 4.9 cm (image 40, series 2), additional ill-defined component with the subcapsular inferior aspect of the right lobe of the liver measuring approximately 5.2 x 4.4 cm (image 63, series 2) an ill-defined slightly hyperattenuating fluid collection within the porta hepatis measuring approximately 8.5 x 4.4 cm (image 66, series 2). All 3 of these fluid collections were identified sonographically. The right superolateral abdomen as well as the midline of the upper abdomen was prepped and draped in usual sterile fashion. The overlying soft tissues were anesthetized with 1% lidocaine with epinephrine. Under direct ultrasound guidance, each hypoechoic collection were sequentially accessed with an 18 gauge trocar needle. Both hepatic samples yielded the aspiration of a small amount of purulent fluid, while the porta hepatis collection yielded a small amount of bilious appearing fluid. Short Amplatz wires were placed at all locations. CT scanning demonstrated appropriate positioning of the Amplatz wires within both hepatic abscesses, however the wire was not appropriately coiled within the porta hepatis collection. Next, both hepatic abscess drainage catheters were placed after the tracks were serially dilated allowing placement of a 10 [place] all-purpose drainage catheters. As patient was complaining of discomfort lying on the table and then developed rigors, additional intervention was not performed at this time to attempt placement of a drainage catheter into the perihepatic abscess. Limited postprocedural scanning was performed demonstrating appropriate positioning of both hepatic abscess drainage catheters. Approximately 15 cc of purulent fluid was aspirated from the superolateral abscess while approximately 35 cc was aspirated from the abscess within the anterior aspect the right lobe of the liver. Both drainage catheters were secured at the skin entrance  site within interrupted suture and connected to JP bulbs. Dressings were placed. IMPRESSION: 1. Successful ultrasound and CT-guided drainage catheter placement into dominant abscess within the superolateral aspect the right lobe of the liver yielding 15 cc of purulent material. 2. Successful ultrasound and CT-guided drainage catheter placement into dominant abscess within the anterior inferior aspect the right lobe of the liver yielding 35 cc of  purulent material. 3. Attempted though ultimately unsuccessful placement of a percutaneous drainage catheter into the ill-defined suspected abscess within the porta hepatis abscess, secondary to patient's inability to tolerate the procedure as well as development of rigors following placement of the 2 additional hepatic abscess drainage catheters. 4. A representative sample of aspirated purulent material was sent to the laboratory for analysis. PLAN: - As the suspected abscess within the dome of the right lobe of the liver and potential abscess within the porta hepatis remain undrained, would have a low threshold to repeat an IV only abdominal CT if patient has infectious symptoms do not defervesce within the next several days, however note, both collections may be NOT amenable to image guided drainage secondary to location and patient body habitus. Electronically Signed   By: Sandi Mariscal M.D.   On: 06/30/2017 14:55    Labs:  CBC:  Recent Labs  06/28/17 1232 06/29/17 0730 06/30/17 0709 07/01/17 0318  WBC 21.9* 17.7* 18.8* 33.1*  HGB 11.9* 10.6* 11.8* 10.7*  HCT 36.8* 33.4* 36.2* 34.0*  PLT 491* 487* 584* 517*    COAGS:  Recent Labs  06/30/17 0709  INR 1.24    BMP:  Recent Labs  06/10/17 1650 06/28/17 1232 06/29/17 0730 06/30/17 0709 07/01/17 0318  NA 131* 138  --  133* 135  K 4.1 4.2  --  3.7 3.7  CL 97* 101  --  96* 101  CO2 25 26  --  27 23  GLUCOSE 349* 239*  --  219* 232*  BUN 5* 7  --  <5* 8  CALCIUM 8.7* 8.9  --  8.2* 8.1*    CREATININE 0.81 0.71 0.69 0.80 0.86  GFRNONAA >60 >60 >60 >60 >60  GFRAA >60 >60 >60 >60 >60    LIVER FUNCTION TESTS:  Recent Labs  06/10/17 1650 06/28/17 1232 06/30/17 0709  BILITOT 0.9 3.3* 1.7*  AST 23 115* 38  ALT 32 73* 40  ALKPHOS 150* 474* 406*  PROT 7.7 7.9 7.5  ALBUMIN 2.9* 2.2* 1.7*    Assessment and Plan:  2 intrahepatic abscess drains placed 10/25 OP +wbc and Gram+ Will follow  Electronically Signed: Nemesis Rainwater A, PA-C 07/01/2017, 11:56 AM   I spent a total of 15 Minutes at the the patient's bedside AND on the patient's hospital floor or unit, greater than 50% of which was counseling/coordinating care for intrahep abscess drains

## 2017-07-02 DIAGNOSIS — Z931 Gastrostomy status: Secondary | ICD-10-CM

## 2017-07-02 DIAGNOSIS — K819 Cholecystitis, unspecified: Secondary | ICD-10-CM

## 2017-07-02 LAB — CBC
HEMATOCRIT: 35.3 % — AB (ref 39.0–52.0)
HEMOGLOBIN: 11.4 g/dL — AB (ref 13.0–17.0)
MCH: 26.2 pg (ref 26.0–34.0)
MCHC: 32.3 g/dL (ref 30.0–36.0)
MCV: 81.1 fL (ref 78.0–100.0)
Platelets: 520 10*3/uL — ABNORMAL HIGH (ref 150–400)
RBC: 4.35 MIL/uL (ref 4.22–5.81)
RDW: 14.7 % (ref 11.5–15.5)
WBC: 28.3 10*3/uL — ABNORMAL HIGH (ref 4.0–10.5)

## 2017-07-02 LAB — HEPATIC FUNCTION PANEL
ALBUMIN: 1.6 g/dL — AB (ref 3.5–5.0)
ALK PHOS: 337 U/L — AB (ref 38–126)
ALT: 27 U/L (ref 17–63)
AST: 25 U/L (ref 15–41)
BILIRUBIN DIRECT: 0.4 mg/dL (ref 0.1–0.5)
BILIRUBIN TOTAL: 0.9 mg/dL (ref 0.3–1.2)
Indirect Bilirubin: 0.5 mg/dL (ref 0.3–0.9)
Total Protein: 7.8 g/dL (ref 6.5–8.1)

## 2017-07-02 LAB — RENAL FUNCTION PANEL
ANION GAP: 11 (ref 5–15)
Albumin: 1.6 g/dL — ABNORMAL LOW (ref 3.5–5.0)
BUN: 7 mg/dL (ref 6–20)
CO2: 23 mmol/L (ref 22–32)
Calcium: 8.4 mg/dL — ABNORMAL LOW (ref 8.9–10.3)
Chloride: 101 mmol/L (ref 101–111)
Creatinine, Ser: 0.71 mg/dL (ref 0.61–1.24)
GFR calc Af Amer: >60 mL/min (ref >60)
GFR calc non Af Amer: >60 mL/min (ref >60)
GLUCOSE: 221 mg/dL — AB (ref 65–99)
POTASSIUM: 3.5 mmol/L (ref 3.5–5.1)
Phosphorus: 3.5 mg/dL (ref 2.5–4.6)
Sodium: 135 mmol/L (ref 135–145)

## 2017-07-02 LAB — GLUCOSE, CAPILLARY
GLUCOSE-CAPILLARY: 198 mg/dL — AB (ref 65–99)
Glucose-Capillary: 212 mg/dL — ABNORMAL HIGH (ref 65–99)
Glucose-Capillary: 223 mg/dL — ABNORMAL HIGH (ref 65–99)
Glucose-Capillary: 232 mg/dL — ABNORMAL HIGH (ref 65–99)
Glucose-Capillary: 254 mg/dL — ABNORMAL HIGH (ref 65–99)

## 2017-07-02 MED ORDER — INSULIN GLARGINE 100 UNIT/ML ~~LOC~~ SOLN
20.0000 [IU] | Freq: Every day | SUBCUTANEOUS | Status: DC
  Administered 2017-07-02 – 2017-07-05 (×4): 20 [IU] via SUBCUTANEOUS
  Filled 2017-07-02 (×4): qty 0.2

## 2017-07-02 MED ORDER — LOPERAMIDE HCL 2 MG PO CAPS
2.0000 mg | ORAL_CAPSULE | ORAL | Status: DC | PRN
  Administered 2017-07-02: 2 mg via ORAL
  Filled 2017-07-02: qty 1

## 2017-07-02 MED ORDER — ALUM & MAG HYDROXIDE-SIMETH 200-200-20 MG/5ML PO SUSP
30.0000 mL | Freq: Three times a day (TID) | ORAL | Status: DC | PRN
  Administered 2017-07-02 – 2017-07-03 (×2): 30 mL via ORAL
  Filled 2017-07-02 (×2): qty 30

## 2017-07-02 MED ORDER — SODIUM CHLORIDE 0.9% FLUSH
5.0000 mL | Freq: Three times a day (TID) | INTRAVENOUS | Status: DC
  Administered 2017-07-02 – 2017-07-13 (×21): 5 mL via INTRAVENOUS

## 2017-07-02 NOTE — Progress Notes (Signed)
Patient ID: [name], male   DOB: [birth year], 51 y.o.   MRN: [id number] [facility] Surgery Progress Note:   * No surgery found *  Subjective: Mental status is alert;  Appropriate questions asked of me.   Objective: Vital signs in last 24 hours: Temp:  [97.6 F (36.4 C)-97.9 F (36.6 C)] 97.8 F (36.6 C) (10/27 0759) Pulse Rate:  [83-106] 87 (10/27 0800) Resp:  [17-26] 18 (10/27 0800) BP: (113-134)/(69-82) 113/82 (10/27 0800) SpO2:  [92 %-98 %] 95 % (10/27 0800)  Intake/Output from previous day: 10/26 0701 - 10/27 0700 In: 1990.8 [P.O.:840; I.V.:1040.8; IV Piggyback:100] Out: 1000 [Urine:1000] Intake/Output this shift: Total I/O In: 236 [P.O.:236] Out: 250 [Urine:250]  Physical Exam: Work of breathing is not labored;  Large man with two drains in the right upper quadrant-both with clear drainage  Lab Results:  Results for orders placed or performed during the hospital encounter of 06/28/17 (from the past 48 hour(s))  Glucose, capillary     Status: Abnormal   Collection Time: 06/30/17 12:01 PM  Result Value Ref Range   Glucose-Capillary 181 (H) 65 - 99 mg/dL  Aerobic/Anaerobic Culture (surgical/deep wound)     Status: None (Preliminary result)   Collection Time: 06/30/17  1:31 PM  Result Value Ref Range   Specimen Description ABSCESS    Special Requests HEPATIC ABSCESS DRAINAGE    Gram Stain      ABUNDANT WBC PRESENT, PREDOMINANTLY PMN ABUNDANT GRAM POSITIVE COCCI ABUNDANT GRAM POSITIVE RODS    Culture TOO YOUNG TO READ    Report Status PENDING   Iron and TIBC     Status: Abnormal   Collection Time: 06/30/17  4:05 PM  Result Value Ref Range   Iron 11 (L) 45 - 182 ug/dL   TIBC 130 (L) 250 - 450 ug/dL   Saturation Ratios 8 (L) 17.9 - 39.5 %   UIBC 119 ug/dL  Ferritin     Status: Abnormal   Collection Time: 06/30/17  4:05 PM  Result Value Ref Range   Ferritin 1,155 (H) 24 - 336 ng/mL  Glucose, capillary     Status: Abnormal   Collection Time: 06/30/17  4:27  PM  Result Value Ref Range   Glucose-Capillary 174 (H) 65 - 99 mg/dL  Glucose, capillary     Status: Abnormal   Collection Time: 06/30/17  8:01 PM  Result Value Ref Range   Glucose-Capillary 205 (H) 65 - 99 mg/dL  CBC     Status: Abnormal   Collection Time: 07/01/17  3:18 AM  Result Value Ref Range   WBC 33.1 (H) 4.0 - 10.5 K/uL   RBC 4.16 (L) 4.22 - 5.81 MIL/uL   Hemoglobin 10.7 (L) 13.0 - 17.0 g/dL   HCT 34.0 (L) 39.0 - 52.0 %   MCV 81.7 78.0 - 100.0 fL   MCH 25.7 (L) 26.0 - 34.0 pg   MCHC 31.5 30.0 - 36.0 g/dL   RDW 15.1 11.5 - 15.5 %   Platelets 517 (H) 150 - 400 K/uL  Basic metabolic panel     Status: Abnormal   Collection Time: 07/01/17  3:18 AM  Result Value Ref Range   Sodium 135 135 - 145 mmol/L   Potassium 3.7 3.5 - 5.1 mmol/L   Chloride 101 101 - 111 mmol/L   CO2 23 22 - 32 mmol/L   Glucose, Bld 232 (H) 65 - 99 mg/dL   BUN 8 6 - 20 mg/dL   Creatinine, Ser 0.86 0.61 -  1.24 mg/dL   Calcium 8.1 (L) 8.9 - 10.3 mg/dL   GFR calc non Af Amer >60 >60 mL/min   GFR calc Af Amer >60 >60 mL/min    Comment: (NOTE) The eGFR has been calculated using the CKD EPI equation. This calculation has not been validated in all clinical situations. eGFR's persistently <60 mL/min signify possible Chronic Kidney Disease.    Anion gap 11 5 - 15  Magnesium     Status: None   Collection Time: 07/01/17  3:18 AM  Result Value Ref Range   Magnesium 1.9 1.7 - 2.4 mg/dL  Glucose, capillary     Status: Abnormal   Collection Time: 07/01/17  3:48 AM  Result Value Ref Range   Glucose-Capillary 208 (H) 65 - 99 mg/dL  Glucose, capillary     Status: Abnormal   Collection Time: 07/01/17  8:48 AM  Result Value Ref Range   Glucose-Capillary 218 (H) 65 - 99 mg/dL   Comment 1 Notify RN    Comment 2 Document in Chart   Glucose, capillary     Status: Abnormal   Collection Time: 07/01/17 11:58 AM  Result Value Ref Range   Glucose-Capillary 213 (H) 65 - 99 mg/dL  Glucose, capillary     Status:  Abnormal   Collection Time: 07/01/17  4:43 PM  Result Value Ref Range   Glucose-Capillary 198 (H) 65 - 99 mg/dL  C difficile quick scan w PCR reflex     Status: None   Collection Time: 07/01/17  8:00 PM  Result Value Ref Range   C Diff antigen NEGATIVE NEGATIVE   C Diff toxin NEGATIVE NEGATIVE   C Diff interpretation No C. difficile detected.   Glucose, capillary     Status: Abnormal   Collection Time: 07/02/17 12:46 AM  Result Value Ref Range   Glucose-Capillary 232 (H) 65 - 99 mg/dL  Glucose, capillary     Status: Abnormal   Collection Time: 07/02/17  5:20 AM  Result Value Ref Range   Glucose-Capillary 212 (H) 65 - 99 mg/dL  CBC     Status: Abnormal   Collection Time: 07/02/17  5:22 AM  Result Value Ref Range   WBC 28.3 (H) 4.0 - 10.5 K/uL   RBC 4.35 4.22 - 5.81 MIL/uL   Hemoglobin 11.4 (L) 13.0 - 17.0 g/dL   HCT 35.3 (L) 39.0 - 52.0 %   MCV 81.1 78.0 - 100.0 fL   MCH 26.2 26.0 - 34.0 pg   MCHC 32.3 30.0 - 36.0 g/dL   RDW 14.7 11.5 - 15.5 %   Platelets 520 (H) 150 - 400 K/uL  Hepatic function panel     Status: Abnormal   Collection Time: 07/02/17  5:22 AM  Result Value Ref Range   Total Protein 7.8 6.5 - 8.1 g/dL   Albumin 1.6 (L) 3.5 - 5.0 g/dL   AST 25 15 - 41 U/L   ALT 27 17 - 63 U/L   Alkaline Phosphatase 337 (H) 38 - 126 U/L   Total Bilirubin 0.9 0.3 - 1.2 mg/dL   Bilirubin, Direct 0.4 0.1 - 0.5 mg/dL   Indirect Bilirubin 0.5 0.3 - 0.9 mg/dL  Renal function panel     Status: Abnormal   Collection Time: 07/02/17  5:22 AM  Result Value Ref Range   Sodium 135 135 - 145 mmol/L   Potassium 3.5 3.5 - 5.1 mmol/L   Chloride 101 101 - 111 mmol/L   CO2 23 22 - 32 mmol/L  Glucose, Bld 221 (H) 65 - 99 mg/dL   BUN 7 6 - 20 mg/dL   Creatinine, Ser 0.71 0.61 - 1.24 mg/dL   Calcium 8.4 (L) 8.9 - 10.3 mg/dL   Phosphorus 3.5 2.5 - 4.6 mg/dL   Albumin 1.6 (L) 3.5 - 5.0 g/dL   GFR calc non Af Amer >60 >60 mL/min   GFR calc Af Amer >60 >60 mL/min    Comment: (NOTE) The  eGFR has been calculated using the CKD EPI equation. This calculation has not been validated in all clinical situations. eGFR's persistently <60 mL/min signify possible Chronic Kidney Disease.    Anion gap 11 5 - 15    Radiology/Results: Ct Image Guided Drainage By Percutaneous Catheter  Result Date: 06/30/2017 INDICATION: History of hepatic abscesses. Please perform ultrasound/CT-guided hepatic abscess drainage catheter placement. EXAM: ULTRASOUND AND CT-GUIDED HEPATIC ABSCESS DRAINAGE CATHETER PLACEMENT x2 COMPARISON:  CT abdomen pelvis - 06/28/2017 MEDICATIONS: The patient is currently admitted to the hospital and receiving intravenous antibiotics. The antibiotics were administered within an appropriate time frame prior to the initiation of the procedure. ANESTHESIA/SEDATION: Moderate (conscious) sedation was employed during this procedure. A total of Versed 7 mg and Fentanyl 400 mcg, Dilaudid 2 mg IV and Demerol 50 mg IV was administered intravenously. Moderate Sedation Time: 25 minutes. The patient's level of consciousness and vital signs were monitored continuously by radiology nursing throughout the procedure under my direct supervision. CONTRAST:  None COMPLICATIONS: SIR LEVEL B - Normal therapy, includes overnight admission for observation. The patient developed rigors following placement of both hepatic abscess drainage catheters which was treated with the administration of 50 mg of Demerol IV. PROCEDURE: Informed written consent was obtained from the patient after a discussion of the risks, benefits and alternatives to treatment. The patient was placed supine on the CT gantry and a pre procedural CT was performed re-demonstrating the known hepatic abscesses with dominant ill-defined component within the superolateral aspect the right lobe of the liver measuring approximately 5.3 x 4.9 cm (image 40, series 2), additional ill-defined component with the subcapsular inferior aspect of the right  lobe of the liver measuring approximately 5.2 x 4.4 cm (image 63, series 2) an ill-defined slightly hyperattenuating fluid collection within the porta hepatis measuring approximately 8.5 x 4.4 cm (image 66, series 2). All 3 of these fluid collections were identified sonographically. The right superolateral abdomen as well as the midline of the upper abdomen was prepped and draped in usual sterile fashion. The overlying soft tissues were anesthetized with 1% lidocaine with epinephrine. Under direct ultrasound guidance, each hypoechoic collection were sequentially accessed with an 18 gauge trocar needle. Both hepatic samples yielded the aspiration of a small amount of purulent fluid, while the porta hepatis collection yielded a small amount of bilious appearing fluid. Short Amplatz wires were placed at all locations. CT scanning demonstrated appropriate positioning of the Amplatz wires within both hepatic abscesses, however the wire was not appropriately coiled within the porta hepatis collection. Next, both hepatic abscess drainage catheters were placed after the tracks were serially dilated allowing placement of a 10 [place] all-purpose drainage catheters. As patient was complaining of discomfort lying on the table and then developed rigors, additional intervention was not performed at this time to attempt placement of a drainage catheter into the perihepatic abscess. Limited postprocedural scanning was performed demonstrating appropriate positioning of both hepatic abscess drainage catheters. Approximately 15 cc of purulent fluid was aspirated from the superolateral abscess while approximately 35 cc was aspirated from  the abscess within the anterior aspect the right lobe of the liver. Both drainage catheters were secured at the skin entrance site within interrupted suture and connected to JP bulbs. Dressings were placed. IMPRESSION: 1. Successful ultrasound and CT-guided drainage catheter placement into dominant  abscess within the superolateral aspect the right lobe of the liver yielding 15 cc of purulent material. 2. Successful ultrasound and CT-guided drainage catheter placement into dominant abscess within the anterior inferior aspect the right lobe of the liver yielding 35 cc of purulent material. 3. Attempted though ultimately unsuccessful placement of a percutaneous drainage catheter into the ill-defined suspected abscess within the porta hepatis abscess, secondary to patient's inability to tolerate the procedure as well as development of rigors following placement of the 2 additional hepatic abscess drainage catheters. 4. A representative sample of aspirated purulent material was sent to the laboratory for analysis. PLAN: - As the suspected abscess within the dome of the right lobe of the liver and potential abscess within the porta hepatis remain undrained, would have a low threshold to repeat an IV only abdominal CT if patient has infectious symptoms do not defervesce within the next several days, however note, both collections may be NOT amenable to image guided drainage secondary to location and patient body habitus. Electronically Signed   By: Sandi Mariscal M.D.   On: 06/30/2017 14:55   Ct Image Guided Drainage By Percutaneous Catheter  Result Date: 06/30/2017 INDICATION: History of hepatic abscesses. Please perform ultrasound/CT-guided hepatic abscess drainage catheter placement. EXAM: ULTRASOUND AND CT-GUIDED HEPATIC ABSCESS DRAINAGE CATHETER PLACEMENT x2 COMPARISON:  CT abdomen pelvis - 06/28/2017 MEDICATIONS: The patient is currently admitted to the hospital and receiving intravenous antibiotics. The antibiotics were administered within an appropriate time frame prior to the initiation of the procedure. ANESTHESIA/SEDATION: Moderate (conscious) sedation was employed during this procedure. A total of Versed 7 mg and Fentanyl 400 mcg, Dilaudid 2 mg IV and Demerol 50 mg IV was administered intravenously.  Moderate Sedation Time: 25 minutes. The patient's level of consciousness and vital signs were monitored continuously by radiology nursing throughout the procedure under my direct supervision. CONTRAST:  None COMPLICATIONS: SIR LEVEL B - Normal therapy, includes overnight admission for observation. The patient developed rigors following placement of both hepatic abscess drainage catheters which was treated with the administration of 50 mg of Demerol IV. PROCEDURE: Informed written consent was obtained from the patient after a discussion of the risks, benefits and alternatives to treatment. The patient was placed supine on the CT gantry and a pre procedural CT was performed re-demonstrating the known hepatic abscesses with dominant ill-defined component within the superolateral aspect the right lobe of the liver measuring approximately 5.3 x 4.9 cm (image 40, series 2), additional ill-defined component with the subcapsular inferior aspect of the right lobe of the liver measuring approximately 5.2 x 4.4 cm (image 63, series 2) an ill-defined slightly hyperattenuating fluid collection within the porta hepatis measuring approximately 8.5 x 4.4 cm (image 66, series 2). All 3 of these fluid collections were identified sonographically. The right superolateral abdomen as well as the midline of the upper abdomen was prepped and draped in usual sterile fashion. The overlying soft tissues were anesthetized with 1% lidocaine with epinephrine. Under direct ultrasound guidance, each hypoechoic collection were sequentially accessed with an 18 gauge trocar needle. Both hepatic samples yielded the aspiration of a small amount of purulent fluid, while the porta hepatis collection yielded a small amount of bilious appearing fluid. Short Amplatz wires were placed  at all locations. CT scanning demonstrated appropriate positioning of the Amplatz wires within both hepatic abscesses, however the wire was not appropriately coiled within the  porta hepatis collection. Next, both hepatic abscess drainage catheters were placed after the tracks were serially dilated allowing placement of a 10 [place] all-purpose drainage catheters. As patient was complaining of discomfort lying on the table and then developed rigors, additional intervention was not performed at this time to attempt placement of a drainage catheter into the perihepatic abscess. Limited postprocedural scanning was performed demonstrating appropriate positioning of both hepatic abscess drainage catheters. Approximately 15 cc of purulent fluid was aspirated from the superolateral abscess while approximately 35 cc was aspirated from the abscess within the anterior aspect the right lobe of the liver. Both drainage catheters were secured at the skin entrance site within interrupted suture and connected to JP bulbs. Dressings were placed. IMPRESSION: 1. Successful ultrasound and CT-guided drainage catheter placement into dominant abscess within the superolateral aspect the right lobe of the liver yielding 15 cc of purulent material. 2. Successful ultrasound and CT-guided drainage catheter placement into dominant abscess within the anterior inferior aspect the right lobe of the liver yielding 35 cc of purulent material. 3. Attempted though ultimately unsuccessful placement of a percutaneous drainage catheter into the ill-defined suspected abscess within the porta hepatis abscess, secondary to patient's inability to tolerate the procedure as well as development of rigors following placement of the 2 additional hepatic abscess drainage catheters. 4. A representative sample of aspirated purulent material was sent to the laboratory for analysis. PLAN: - As the suspected abscess within the dome of the right lobe of the liver and potential abscess within the porta hepatis remain undrained, would have a low threshold to repeat an IV only abdominal CT if patient has infectious symptoms do not defervesce  within the next several days, however note, both collections may be NOT amenable to image guided drainage secondary to location and patient body habitus. Electronically Signed   By: Sandi Mariscal M.D.   On: 06/30/2017 14:55    Anti-infectives: Anti-infectives    Start     Dose/Rate Route Frequency Ordered Stop   06/30/17 2000  cefTRIAXone (ROCEPHIN) 2 g in dextrose 5 % 50 mL IVPB     2 g 100 mL/hr over 30 Minutes Intravenous Every 24 hours 06/30/17 1304     06/30/17 2000  metroNIDAZOLE (FLAGYL) tablet 500 mg     500 mg Oral Every 8 hours 06/30/17 1304     06/30/17 0500  vancomycin (VANCOCIN) 1,500 mg in sodium chloride 0.9 % 500 mL IVPB  Status:  Discontinued     1,500 mg 250 mL/hr over 120 Minutes Intravenous Every 12 hours 06/29/17 1657 06/30/17 1304   06/29/17 1700  vancomycin (VANCOCIN) 2,500 mg in sodium chloride 0.9 % 500 mL IVPB     2,500 mg 250 mL/hr over 120 Minutes Intravenous  Once 06/29/17 1654 06/29/17 1900   06/29/17 0430  piperacillin-tazobactam (ZOSYN) IVPB 3.375 g  Status:  Discontinued     3.375 g 12.5 mL/hr over 240 Minutes Intravenous Every 8 hours 06/29/17 0042 06/30/17 1304   06/29/17 0013  metroNIDAZOLE (FLAGYL) IVPB 500 mg  Status:  Discontinued     500 mg 100 mL/hr over 60 Minutes Intravenous Every 8 hours 06/29/17 0013 06/29/17 1146   06/28/17 2100  metroNIDAZOLE (FLAGYL) IVPB 500 mg     500 mg 100 mL/hr over 60 Minutes Intravenous  Once 06/28/17 2049 06/28/17 2214   06/28/17  2015  piperacillin-tazobactam (ZOSYN) IVPB 3.375 g     3.375 g 100 mL/hr over 30 Minutes Intravenous  Once 06/28/17 2008 06/28/17 2101      Assessment/Plan: Problem List: Patient Active Problem List   Diagnosis Date Noted  . Diarrhea   . Bacteremia due to Gram-positive bacteria   . Acute cholecystitis   . Type 2 diabetes mellitus without complication, without long-term current use of insulin ([place])   . Hepatic abscess 06/28/2017  . Abscess, hepatic 06/28/2017  . Right knee pain  01/08/2016  . Left knee pain 01/08/2016  . Morbid obesity due to excess calories ([place]) 01/08/2016    Managing abscess sepsis from necrotic GB in obese diabetic.  Stable * No surgery found *    LOS: 4 days   Matt B. Hassell Done, MD, [facility] [phone] beeper [phone]  07/02/2017 11:56 AM

## 2017-07-02 NOTE — Progress Notes (Signed)
PROGRESS NOTE    [name]  WCB:[id number] DOB: [birth year] DOA: 06/28/2017 PCP: [name], PA-C   Brief Narrative: 51 year old morbidly obese male with history of hypertension, diabetes, gout who was sent from [facility] for the management of intra-abdominal abscess. Patient reported that he has not been feeling well for about a month. He was prescribed antibiotics by his PCP for upper respiratory tract infection and took for a week without any improvement.he presented with nausea, abdominal pain and not feeling well. In the ER CT scan consistent with multiple liver abscesses and possible cholecystitis. General surgeon was contacted and patient was transferred to [facility] for further evaluation. S/p RUQ 2 perc drain placed by IR on 10/25.  Assessment & Plan:   # Multiple intrahepatic abscesses and possible cholecystitis: -Patient was evaluated by general surgeon, IR s/p  percutaneous drainage x2 .  -drain management as per IR and surgeon. -ID consult appreciated.Currently on ceftriaxone and Flagyl. Follow up final culture result. Leukocytosis improving. Monitor CBC.  #Group F streptococcal bacteremia: -Follow-up repeat culture, negative so far. -continue current antibiotics. -echocardiogram with no vegetation.  -Follow up ID for antibiotic splan  # Diarrhea: Likely due to antibiotics. C diff. Negative.  Imodium prn.  # type 2 diabetes: Patient will go home with insulin.  -Blood sugar level is still elevated. Increase the dose of Lantus to 20 units. Continue sliding scale. Continue diabetic teaching.  # normocytic anemia/iron deficiency anemia: No sign of bleeding. Hold on iron supplement because of bacteremia. Monitor CBC.  DVT prophylaxis:lovenox sq Code Status:full code Family Communication:no family at bedside Disposition Plan:admitted    Consultants:   Surgery  IR  ID  Procedures:2 abdo drain Antimicrobials:ceftriaxone and flagyl  Subjective: She and  examined at bedside. Still complaining of right-sided abdominal pain. No fever, chills, nausea vomiting chest pain or shortness of breath.  Objective: Vitals:   07/02/17 0430 07/02/17 0505 07/02/17 0759 07/02/17 0800  BP: 126/76  113/82 113/82  Pulse: 94 (!) 106 88 87  Resp: 19 (!) 26 19 18   Temp:   97.8 F (36.6 C)   TempSrc:   Oral   SpO2: 96% 95% 95% 95%  Weight:      Height:        Intake/Output Summary (Last 24 hours) at 07/02/17 1108 Last data filed at 07/02/17 0900  Gross per 24 hour  Intake          1986.83 ml  Output             1100 ml  Net           886.83 ml   Filed Weights   06/28/17 1535 06/29/17 0403  Weight: (!) 170.1 kg (375 lb) (!) 170.5 kg (375 lb 12.8 oz)    Examination:  General exam: Appears calm and comfortable  Respiratory system: bibasal decreased breath sound, no wheezing Cardiovascular system: S1 & S2 heard, RRR.  No pedal edema. Gastrointestinal system: Abdomen is nondistended, soft and nontender. Normal bowel sounds heard.right-sided abdominal 2 drains, site looks clean. Central nervous system: Alert and oriented. No focal neurological deficits. Extremities: Symmetric 5 x 5 power. Skin: No rashes, lesions or ulcers Psychiatry: Judgement and insight appear normal. Mood & affect appropriate.     Data Reviewed: I have personally reviewed following labs and imaging studies  CBC:  Recent Labs Lab 06/28/17 1232 06/29/17 0730 06/30/17 0709 07/01/17 0318 07/02/17 0522  WBC 21.9* 17.7* 18.8* 33.1* 28.3*  NEUTROABS 16.7*  --   --   --   --  HGB 11.9* 10.6* 11.8* 10.7* 11.4*  HCT 36.8* 33.4* 36.2* 34.0* 35.3*  MCV 82.5 81.5 82.3 81.7 81.1  PLT 491* 487* 584* 517* 412*   Basic Metabolic Panel:  Recent Labs Lab 06/28/17 1232 06/29/17 0730 06/30/17 0709 07/01/17 0318 07/02/17 0522  NA 138  --  133* 135 135  K 4.2  --  3.7 3.7 3.5  CL 101  --  96* 101 101  CO2 26  --  27 23 23   GLUCOSE 239*  --  219* 232* 221*  BUN 7  --  <5* 8 7   CREATININE 0.71 0.69 0.80 0.86 0.71  CALCIUM 8.9  --  8.2* 8.1* 8.4*  MG  --   --   --  1.9  --   PHOS  --   --   --   --  3.5   GFR: Estimated Creatinine Clearance: 164.8 mL/min (by C-G formula based on SCr of 0.71 mg/dL). Liver Function Tests:  Recent Labs Lab 06/28/17 1232 06/30/17 0709 07/02/17 0522  AST 115* 38 25  ALT 73* 40 27  ALKPHOS 474* 406* 337*  BILITOT 3.3* 1.7* 0.9  PROT 7.9 7.5 7.8  ALBUMIN 2.2* 1.7* 1.6*  1.6*    Recent Labs Lab 06/28/17 1849  LIPASE 20   No results for input(s): AMMONIA in the last 168 hours. Coagulation Profile:  Recent Labs Lab 06/30/17 0709  INR 1.24   Cardiac Enzymes: No results for input(s): CKTOTAL, CKMB, CKMBINDEX, TROPONINI in the last 168 hours. BNP (last 3 results) No results for input(s): PROBNP in the last 8760 hours. HbA1C: No results for input(s): HGBA1C in the last 72 hours. CBG:  Recent Labs Lab 07/01/17 0848 07/01/17 1158 07/01/17 1643 07/02/17 0046 07/02/17 0520  GLUCAP 218* 213* 198* 232* 212*   Lipid Profile: No results for input(s): CHOL, HDL, LDLCALC, TRIG, CHOLHDL, LDLDIRECT in the last 72 hours. Thyroid Function Tests: No results for input(s): TSH, T4TOTAL, FREET4, T3FREE, THYROIDAB in the last 72 hours. Anemia Panel:  Recent Labs  06/30/17 1605  FERRITIN 1,155*  TIBC 130*  IRON 11*   Sepsis Labs:  Recent Labs Lab 06/28/17 1849 06/28/17 2017  LATICACIDVEN 1.4 1.5    Recent Results (from the past 240 hour(s))  Urine culture     Status: None   Collection Time: 06/28/17  6:28 PM  Result Value Ref Range Status   Specimen Description URINE, RANDOM  Final   Special Requests NONE  Final   Culture   Final    NO GROWTH Performed at [facility], [place] [address], [place],  [address]    Report Status 06/30/2017 FINAL  Final  Blood culture (routine x 2)     Status: Abnormal (Preliminary result)   Collection Time: 06/28/17  6:49 PM  Result Value Ref Range Status    Specimen Description RIGHT ANTECUBITAL  Final   Special Requests   Final    BOTTLES DRAWN AEROBIC AND ANAEROBIC Blood Culture adequate volume   Culture  Setup Time   Final    GRAM POSITIVE COCCI Gram Stain Report Called to,Read Back By and Verified With: ANGELICA DAVIS. @ 6720 ON [id number] BY HENDERSON L. ANAEROBIC AEROBIC Gram Stain Report Called to,Read Back By and Verified With: MIZE,J @ 1930 ON 10.24.18 BY BOWMAN,L CRITICAL RESULT CALLED TO, READ BACK BY AND VERIFIED WITH: J.LEDFORD PHARMD 06/30/17 0207 L.CHAMPION    Culture (A)  Final    STREPTOCOCCUS GROUP F SUSCEPTIBILITIES TO FOLLOW Performed at [facility]  [facility], [place] [address] [place], [place] [address]    Report Status PENDING  Incomplete  Blood culture (routine x 2)     Status: Abnormal (Preliminary result)   Collection Time: 06/28/17  6:49 PM  Result Value Ref Range Status   Specimen Description BLOOD RIGHT HAND  Final   Special Requests   Final    BOTTLES DRAWN AEROBIC AND ANAEROBIC Blood Culture adequate volume   Culture  Setup Time   Final    GRAM POSITIVE COCCI Gram Stain Report Called to,Read Back By and Verified With: ANGELICA DAVIS @ 2423 ON [id number] BY HENDERSON L. ANAEROBIC AEROBIC Gram Stain Report Called to,Read Back By and Verified With: MIZE, J @ 1930 ON 10.24.18 BY BOWMAN,L    Culture STREPTOCOCCUS GROUP F (A)  Final   Report Status PENDING  Incomplete  Blood Culture ID Panel (Reflexed)     Status: Abnormal   Collection Time: 06/28/17  6:49 PM  Result Value Ref Range Status   Enterococcus species NOT DETECTED NOT DETECTED Final   Listeria monocytogenes NOT DETECTED NOT DETECTED Final   Staphylococcus species NOT DETECTED NOT DETECTED Final   Staphylococcus aureus NOT DETECTED NOT DETECTED Final   Streptococcus species DETECTED (A) NOT DETECTED Final    Comment: Not Enterococcus species, Streptococcus agalactiae, Streptococcus pyogenes, or Streptococcus pneumoniae. CRITICAL RESULT CALLED TO, READ BACK  BY AND VERIFIED WITH: J.LEDFORD PHARMD 06/30/17 0207 L.CHAMPION    Streptococcus agalactiae NOT DETECTED NOT DETECTED Final   Streptococcus pneumoniae NOT DETECTED NOT DETECTED Final   Streptococcus pyogenes NOT DETECTED NOT DETECTED Final   Acinetobacter baumannii NOT DETECTED NOT DETECTED Final   Enterobacteriaceae species NOT DETECTED NOT DETECTED Final   Enterobacter cloacae complex NOT DETECTED NOT DETECTED Final   Escherichia coli NOT DETECTED NOT DETECTED Final   Klebsiella oxytoca NOT DETECTED NOT DETECTED Final   Klebsiella pneumoniae NOT DETECTED NOT DETECTED Final   Proteus species NOT DETECTED NOT DETECTED Final   Serratia marcescens NOT DETECTED NOT DETECTED Final   Haemophilus influenzae NOT DETECTED NOT DETECTED Final   Neisseria meningitidis NOT DETECTED NOT DETECTED Final   Pseudomonas aeruginosa NOT DETECTED NOT DETECTED Final   Candida albicans NOT DETECTED NOT DETECTED Final   Candida glabrata NOT DETECTED NOT DETECTED Final   Candida krusei NOT DETECTED NOT DETECTED Final   Candida parapsilosis NOT DETECTED NOT DETECTED Final   Candida tropicalis NOT DETECTED NOT DETECTED Final    Comment: Performed at [facility], [place] [address], [place], [place] [address]  MRSA PCR Screening     Status: None   Collection Time: 06/29/17  4:12 AM  Result Value Ref Range Status   MRSA by PCR NEGATIVE NEGATIVE Final    Comment:        The GeneXpert MRSA Assay (FDA approved for NASAL specimens only), is one component of a comprehensive MRSA colonization surveillance program. It is not intended to diagnose MRSA infection nor to guide or monitor treatment for MRSA infections.   Culture, blood (routine x 2)     Status: None (Preliminary result)   Collection Time: 06/29/17  6:58 PM  Result Value Ref Range Status   Specimen Description BLOOD RIGHT HAND  Final   Special Requests   Final    BOTTLES DRAWN AEROBIC ONLY Blood Culture adequate volume   Culture NO GROWTH 3  DAYS  Final   Report Status PENDING  Incomplete  Culture, blood (routine x 2)     Status:  None (Preliminary result)   Collection Time: 06/29/17  7:09 PM  Result Value Ref Range Status   Specimen Description BLOOD RIGHT ANTECUBITAL  Final   Special Requests   Final    BOTTLES DRAWN AEROBIC AND ANAEROBIC Blood Culture adequate volume   Culture NO GROWTH 3 DAYS  Final   Report Status PENDING  Incomplete  Aerobic/Anaerobic Culture (surgical/deep wound)     Status: None (Preliminary result)   Collection Time: 06/30/17  1:31 PM  Result Value Ref Range Status   Specimen Description ABSCESS  Final   Special Requests HEPATIC ABSCESS DRAINAGE  Final   Gram Stain   Final    ABUNDANT WBC PRESENT, PREDOMINANTLY PMN ABUNDANT GRAM POSITIVE COCCI ABUNDANT GRAM POSITIVE RODS    Culture TOO YOUNG TO READ  Final   Report Status PENDING  Incomplete  C difficile quick scan w PCR reflex     Status: None   Collection Time: 07/01/17  8:00 PM  Result Value Ref Range Status   C Diff antigen NEGATIVE NEGATIVE Final   C Diff toxin NEGATIVE NEGATIVE Final   C Diff interpretation No C. difficile detected.  Final         Radiology Studies: Ct Image Guided Drainage By Percutaneous Catheter  Result Date: 06/30/2017 INDICATION: History of hepatic abscesses. Please perform ultrasound/CT-guided hepatic abscess drainage catheter placement. EXAM: ULTRASOUND AND CT-GUIDED HEPATIC ABSCESS DRAINAGE CATHETER PLACEMENT x2 COMPARISON:  CT abdomen pelvis - 06/28/2017 MEDICATIONS: The patient is currently admitted to the hospital and receiving intravenous antibiotics. The antibiotics were administered within an appropriate time frame prior to the initiation of the procedure. ANESTHESIA/SEDATION: Moderate (conscious) sedation was employed during this procedure. A total of Versed 7 mg and Fentanyl 400 mcg, Dilaudid 2 mg IV and Demerol 50 mg IV was administered intravenously. Moderate Sedation Time: 25 minutes. The patient's  level of consciousness and vital signs were monitored continuously by radiology nursing throughout the procedure under my direct supervision. CONTRAST:  None COMPLICATIONS: SIR LEVEL B - Normal therapy, includes overnight admission for observation. The patient developed rigors following placement of both hepatic abscess drainage catheters which was treated with the administration of 50 mg of Demerol IV. PROCEDURE: Informed written consent was obtained from the patient after a discussion of the risks, benefits and alternatives to treatment. The patient was placed supine on the CT gantry and a pre procedural CT was performed re-demonstrating the known hepatic abscesses with dominant ill-defined component within the superolateral aspect the right lobe of the liver measuring approximately 5.3 x 4.9 cm (image 40, series 2), additional ill-defined component with the subcapsular inferior aspect of the right lobe of the liver measuring approximately 5.2 x 4.4 cm (image 63, series 2) an ill-defined slightly hyperattenuating fluid collection within the porta hepatis measuring approximately 8.5 x 4.4 cm (image 66, series 2). All 3 of these fluid collections were identified sonographically. The right superolateral abdomen as well as the midline of the upper abdomen was prepped and draped in usual sterile fashion. The overlying soft tissues were anesthetized with 1% lidocaine with epinephrine. Under direct ultrasound guidance, each hypoechoic collection were sequentially accessed with an 18 gauge trocar needle. Both hepatic samples yielded the aspiration of a small amount of purulent fluid, while the porta hepatis collection yielded a small amount of bilious appearing fluid. Short Amplatz wires were placed at all locations. CT scanning demonstrated appropriate positioning of the Amplatz wires within both hepatic abscesses, however the wire was not appropriately coiled within the porta  hepatis collection. Next, both hepatic  abscess drainage catheters were placed after the tracks were serially dilated allowing placement of a 10 [place] all-purpose drainage catheters. As patient was complaining of discomfort lying on the table and then developed rigors, additional intervention was not performed at this time to attempt placement of a drainage catheter into the perihepatic abscess. Limited postprocedural scanning was performed demonstrating appropriate positioning of both hepatic abscess drainage catheters. Approximately 15 cc of purulent fluid was aspirated from the superolateral abscess while approximately 35 cc was aspirated from the abscess within the anterior aspect the right lobe of the liver. Both drainage catheters were secured at the skin entrance site within interrupted suture and connected to JP bulbs. Dressings were placed. IMPRESSION: 1. Successful ultrasound and CT-guided drainage catheter placement into dominant abscess within the superolateral aspect the right lobe of the liver yielding 15 cc of purulent material. 2. Successful ultrasound and CT-guided drainage catheter placement into dominant abscess within the anterior inferior aspect the right lobe of the liver yielding 35 cc of purulent material. 3. Attempted though ultimately unsuccessful placement of a percutaneous drainage catheter into the ill-defined suspected abscess within the porta hepatis abscess, secondary to patient's inability to tolerate the procedure as well as development of rigors following placement of the 2 additional hepatic abscess drainage catheters. 4. A representative sample of aspirated purulent material was sent to the laboratory for analysis. PLAN: - As the suspected abscess within the dome of the right lobe of the liver and potential abscess within the porta hepatis remain undrained, would have a low threshold to repeat an IV only abdominal CT if patient has infectious symptoms do not defervesce within the next several days, however note, both  collections may be NOT amenable to image guided drainage secondary to location and patient body habitus. Electronically Signed   By: Sandi Mariscal M.D.   On: 06/30/2017 14:55   Ct Image Guided Drainage By Percutaneous Catheter  Result Date: 06/30/2017 INDICATION: History of hepatic abscesses. Please perform ultrasound/CT-guided hepatic abscess drainage catheter placement. EXAM: ULTRASOUND AND CT-GUIDED HEPATIC ABSCESS DRAINAGE CATHETER PLACEMENT x2 COMPARISON:  CT abdomen pelvis - 06/28/2017 MEDICATIONS: The patient is currently admitted to the hospital and receiving intravenous antibiotics. The antibiotics were administered within an appropriate time frame prior to the initiation of the procedure. ANESTHESIA/SEDATION: Moderate (conscious) sedation was employed during this procedure. A total of Versed 7 mg and Fentanyl 400 mcg, Dilaudid 2 mg IV and Demerol 50 mg IV was administered intravenously. Moderate Sedation Time: 25 minutes. The patient's level of consciousness and vital signs were monitored continuously by radiology nursing throughout the procedure under my direct supervision. CONTRAST:  None COMPLICATIONS: SIR LEVEL B - Normal therapy, includes overnight admission for observation. The patient developed rigors following placement of both hepatic abscess drainage catheters which was treated with the administration of 50 mg of Demerol IV. PROCEDURE: Informed written consent was obtained from the patient after a discussion of the risks, benefits and alternatives to treatment. The patient was placed supine on the CT gantry and a pre procedural CT was performed re-demonstrating the known hepatic abscesses with dominant ill-defined component within the superolateral aspect the right lobe of the liver measuring approximately 5.3 x 4.9 cm (image 40, series 2), additional ill-defined component with the subcapsular inferior aspect of the right lobe of the liver measuring approximately 5.2 x 4.4 cm (image 63, series  2) an ill-defined slightly hyperattenuating fluid collection within the porta hepatis measuring approximately 8.5 x 4.4 cm (  image 66, series 2). All 3 of these fluid collections were identified sonographically. The right superolateral abdomen as well as the midline of the upper abdomen was prepped and draped in usual sterile fashion. The overlying soft tissues were anesthetized with 1% lidocaine with epinephrine. Under direct ultrasound guidance, each hypoechoic collection were sequentially accessed with an 18 gauge trocar needle. Both hepatic samples yielded the aspiration of a small amount of purulent fluid, while the porta hepatis collection yielded a small amount of bilious appearing fluid. Short Amplatz wires were placed at all locations. CT scanning demonstrated appropriate positioning of the Amplatz wires within both hepatic abscesses, however the wire was not appropriately coiled within the porta hepatis collection. Next, both hepatic abscess drainage catheters were placed after the tracks were serially dilated allowing placement of a 10 [place] all-purpose drainage catheters. As patient was complaining of discomfort lying on the table and then developed rigors, additional intervention was not performed at this time to attempt placement of a drainage catheter into the perihepatic abscess. Limited postprocedural scanning was performed demonstrating appropriate positioning of both hepatic abscess drainage catheters. Approximately 15 cc of purulent fluid was aspirated from the superolateral abscess while approximately 35 cc was aspirated from the abscess within the anterior aspect the right lobe of the liver. Both drainage catheters were secured at the skin entrance site within interrupted suture and connected to JP bulbs. Dressings were placed. IMPRESSION: 1. Successful ultrasound and CT-guided drainage catheter placement into dominant abscess within the superolateral aspect the right lobe of the liver yielding  15 cc of purulent material. 2. Successful ultrasound and CT-guided drainage catheter placement into dominant abscess within the anterior inferior aspect the right lobe of the liver yielding 35 cc of purulent material. 3. Attempted though ultimately unsuccessful placement of a percutaneous drainage catheter into the ill-defined suspected abscess within the porta hepatis abscess, secondary to patient's inability to tolerate the procedure as well as development of rigors following placement of the 2 additional hepatic abscess drainage catheters. 4. A representative sample of aspirated purulent material was sent to the laboratory for analysis. PLAN: - As the suspected abscess within the dome of the right lobe of the liver and potential abscess within the porta hepatis remain undrained, would have a low threshold to repeat an IV only abdominal CT if patient has infectious symptoms do not defervesce within the next several days, however note, both collections may be NOT amenable to image guided drainage secondary to location and patient body habitus. Electronically Signed   By: Sandi Mariscal M.D.   On: 06/30/2017 14:55        Scheduled Meds: . enoxaparin (LOVENOX) injection  40 mg Subcutaneous Daily  .  HYDROmorphone (DILAUDID) injection  1 mg Intravenous Once  . insulin aspart  0-9 Units Subcutaneous TID WC  . insulin glargine  20 Units Subcutaneous QHS  . metroNIDAZOLE  500 mg Oral Q8H  . sodium chloride flush  5 mL Intravenous Q8H   Continuous Infusions: . sodium chloride Stopped (07/02/17 0343)  . cefTRIAXone (ROCEPHIN)  IV Stopped (07/01/17 2102)     LOS: 4 days    Lyndia Bury Tanna Furry, MD [facility] Pager [phone]  If 7PM-7AM, please contact night-coverage [email or web address] Password [email or web address] 07/02/2017, 11:08 AM

## 2017-07-02 NOTE — Evaluation (Signed)
Physical Therapy Evaluation Patient Details Name: [name] MRN: [id number] DOB: 10-08-1965 Today's Date: 07/02/2017   History of Present Illness  Patient is a 51 yo male admitted 06/28/17 with RUQ abdominal pain.  Patient with hepatic abscess and cholecystitis, with percutaneous drains placed 06/30/17.     PMH:  obesity, DM, HTN, gout, OA  Clinical Impression  Patient presents with problems listed below.  Will benefit from acute PT to maximize functional mobility prior to discharge.  Patient was independent pta.  Today patient required max assist for bed mobility, and assist for gait.  Recommend Inpatient Rehab consult with goal to optimize functional mobility prior to returning home.  Patient motivated to work with therapies.    Follow Up Recommendations CIR    Equipment Recommendations  Rolling walker with 5" wheels;3in1 (PT)    Recommendations for Other Services Rehab consult     Precautions / Restrictions Precautions Precautions: None Precaution Comments: 2 percutaneous drains Restrictions Weight Bearing Restrictions: No      Mobility  Bed Mobility Overal bed mobility: Needs Assistance Bed Mobility: Supine to Sit;Sit to Supine     Supine to sit: Mod assist Sit to supine: Max assist   General bed mobility comments: Verbal cues for use of UE's to assist.  Mod assist to raise trunk to sitting, with increased time.  Good sitting balance.   Assist to bring BLE's onto bed to return to supine.  Transfers Overall transfer level: Needs assistance Equipment used: Rolling walker (2 wheeled) Transfers: Sit to/from Stand Sit to Stand: Min guard         General transfer comment: Verbal cues for hand placement.  Assist for safety/balance.  Managed drains.  Ambulation/Gait Ambulation/Gait assistance: Min guard Ambulation Distance (Feet): 8 Feet Assistive device: Rolling walker (2 wheeled) Gait Pattern/deviations: Step-through pattern;Decreased step length -  right;Decreased step length - left;Decreased stride length;Shuffle;Trunk flexed Gait velocity: decreased Gait velocity interpretation: Below normal speed for age/gender General Gait Details: Verbal cues for safe use of RW.  Patient able to ambulate 4' forward and 4' backward near bed for safety.  Patient with increased pain in stance/gait.    Stairs            Wheelchair Mobility    Modified Rankin (Stroke Patients Only)       Balance Overall balance assessment: Needs assistance         Standing balance support: Bilateral upper extremity supported Standing balance-Leahy Scale: Poor                               Pertinent Vitals/Pain Pain Assessment: 0-10 Pain Score: 8  Pain Location: Near drain insertion sites Pain Descriptors / Indicators: Sharp Pain Intervention(s): Monitored during session;Repositioned    Home Living Family/patient expects to be discharged to:: Private residence Living Arrangements: Other relatives (Brother) Available Help at Discharge: Family;Available 24 hours/day Type of Home: House Home Access: Level entry     Home Layout: One level Home Equipment: None      Prior Function Level of Independence: Independent         Comments: Drives     Hand Dominance        Extremity/Trunk Assessment   Upper Extremity Assessment Upper Extremity Assessment: Overall WFL for tasks assessed    Lower Extremity Assessment Lower Extremity Assessment: Generalized weakness       Communication   Communication: No difficulties  Cognition Arousal/Alertness: Awake/alert Behavior During Therapy: [facility]  for tasks assessed/performed Overall Cognitive Status: Within Functional Limits for tasks assessed                                        General Comments      Exercises     Assessment/Plan    PT Assessment Patient needs continued PT services  PT Problem List Decreased strength;Decreased activity  tolerance;Decreased balance;Decreased mobility;Decreased knowledge of use of DME;Obesity;Pain       PT Treatment Interventions DME instruction;Gait training;Functional mobility training;Therapeutic activities;Therapeutic exercise;Patient/family education    PT Goals (Current goals can be found in the Care Plan section)  Acute Rehab PT Goals Patient Stated Goal: To get stronger PT Goal Formulation: With patient Time For Goal Achievement: 07/09/17 Potential to Achieve Goals: Good    Frequency Min 3X/week   Barriers to discharge        Co-evaluation               AM-PAC PT "6 Clicks" Daily Activity  Outcome Measure Difficulty turning over in bed (including adjusting bedclothes, sheets and blankets)?: A Little Difficulty moving from lying on back to sitting on the side of the bed? : Unable Difficulty sitting down on and standing up from a chair with arms (e.g., wheelchair, bedside commode, etc,.)?: A Little Help needed moving to and from a bed to chair (including a wheelchair)?: A Little Help needed walking in hospital room?: A Little Help needed climbing 3-5 steps with a railing? : A Lot 6 Click Score: 15    End of Session   Activity Tolerance: Patient limited by fatigue;Patient limited by pain Patient left: in bed;with call bell/phone within reach;with family/visitor present Nurse Communication: Mobility status PT Visit Diagnosis: Unsteadiness on feet (R26.81);Muscle weakness (generalized) (M62.81);Pain Pain - Right/Left: Right Pain - part of body:  (RUQ of abdomen)    Time: 5597-4163 PT Time Calculation (min) (ACUTE ONLY): 19 min   Charges:   PT Evaluation $PT Eval Moderate Complexity: 1 Mod     PT G Codes:        Carita Pian. Sanjuana Kava, [facility] Acute Rehab Services Pager [place] 07/02/2017, 3:58 PM

## 2017-07-02 NOTE — Progress Notes (Signed)
[name] for Infectious Disease   Reason for visit: Follow up on liver abscess  Interval History: feels better, WBC down to 28, repeat blood cultures ngtd, TTE without vegetation.  No chills.  No associated n/v/d.  Total days of antibiotics 5 Day 3 ceftriaxone/metronidazole  Physical Exam: Constitutional:  Vitals:   07/02/17 0800 07/02/17 1200  BP: 113/82   Pulse: 87   Resp: 18   Temp:  97.8 F (36.6 C)  SpO2: 95%    patient appears in NAD Eyes: anicteric HENT: no thrush Respiratory: Normal respiratory effort; CTA B Cardiovascular: RRR GI: soft, nt, nd; JP in place  Review of Systems: Constitutional: negative for chills and malaise Gastrointestinal: negative for nausea and diarrhea Integument/breast: negative for rash  Lab Results  Component Value Date   WBC 28.3 (H) 07/02/2017   HGB 11.4 (L) 07/02/2017   HCT 35.3 (L) 07/02/2017   MCV 81.1 07/02/2017   PLT 520 (H) 07/02/2017    Lab Results  Component Value Date   CREATININE 0.71 07/02/2017   BUN 7 07/02/2017   NA 135 07/02/2017   K 3.5 07/02/2017   CL 101 07/02/2017   CO2 23 07/02/2017    Lab Results  Component Value Date   ALT 27 07/02/2017   AST 25 07/02/2017   ALKPHOS 337 (H) 07/02/2017     Microbiology: Recent Results (from the past 240 hour(s))  Urine culture     Status: None   Collection Time: 06/28/17  6:28 PM  Result Value Ref Range Status   Specimen Description URINE, RANDOM  Final   Special Requests NONE  Final   Culture   Final    NO GROWTH Performed at [facility], [address], [place], [place] [address]    Report Status 06/30/2017 FINAL  Final  Blood culture (routine x 2)     Status: Abnormal (Preliminary result)   Collection Time: 06/28/17  6:49 PM  Result Value Ref Range Status   Specimen Description RIGHT ANTECUBITAL  Final   Special Requests   Final    BOTTLES DRAWN AEROBIC AND ANAEROBIC Blood Culture adequate volume   Culture  Setup Time   Final    GRAM  POSITIVE COCCI Gram Stain Report Called to,Read Back By and Verified With: ANGELICA DAVIS. @ 6160 ON [id number] BY HENDERSON L. ANAEROBIC AEROBIC Gram Stain Report Called to,Read Back By and Verified With: MIZE,J @ 1930 ON 10.24.18 BY BOWMAN,L CRITICAL RESULT CALLED TO, READ BACK BY AND VERIFIED WITH: J.LEDFORD PHARMD 06/30/17 0207 L.CHAMPION    Culture (A)  Final    STREPTOCOCCUS GROUP F SUSCEPTIBILITIES TO FOLLOW Performed at [facility], [place] [address] [place], [place] [address]    Report Status PENDING  Incomplete  Blood culture (routine x 2)     Status: Abnormal (Preliminary result)   Collection Time: 06/28/17  6:49 PM  Result Value Ref Range Status   Specimen Description BLOOD RIGHT HAND  Final   Special Requests   Final    BOTTLES DRAWN AEROBIC AND ANAEROBIC Blood Culture adequate volume   Culture  Setup Time   Final    GRAM POSITIVE COCCI Gram Stain Report Called to,Read Back By and Verified With: ANGELICA DAVIS @ 6270 ON [id number] BY HENDERSON L. ANAEROBIC AEROBIC Gram Stain Report Called to,Read Back By and Verified With: MIZE, J @ 1930 ON 10.24.18 BY BOWMAN,L    Culture STREPTOCOCCUS GROUP F (A)  Final   Report Status PENDING  Incomplete  Blood Culture ID Panel (Reflexed)     Status: Abnormal   Collection Time: 06/28/17  6:49 PM  Result Value Ref Range Status   Enterococcus species NOT DETECTED NOT DETECTED Final   Listeria monocytogenes NOT DETECTED NOT DETECTED Final   Staphylococcus species NOT DETECTED NOT DETECTED Final   Staphylococcus aureus NOT DETECTED NOT DETECTED Final   Streptococcus species DETECTED (A) NOT DETECTED Final    Comment: Not Enterococcus species, Streptococcus agalactiae, Streptococcus pyogenes, or Streptococcus pneumoniae. CRITICAL RESULT CALLED TO, READ BACK BY AND VERIFIED WITH: J.LEDFORD PHARMD 06/30/17 0207 L.CHAMPION    Streptococcus agalactiae NOT DETECTED NOT DETECTED Final   Streptococcus pneumoniae NOT DETECTED NOT DETECTED  Final   Streptococcus pyogenes NOT DETECTED NOT DETECTED Final   Acinetobacter baumannii NOT DETECTED NOT DETECTED Final   Enterobacteriaceae species NOT DETECTED NOT DETECTED Final   Enterobacter cloacae complex NOT DETECTED NOT DETECTED Final   Escherichia coli NOT DETECTED NOT DETECTED Final   Klebsiella oxytoca NOT DETECTED NOT DETECTED Final   Klebsiella pneumoniae NOT DETECTED NOT DETECTED Final   Proteus species NOT DETECTED NOT DETECTED Final   Serratia marcescens NOT DETECTED NOT DETECTED Final   Haemophilus influenzae NOT DETECTED NOT DETECTED Final   Neisseria meningitidis NOT DETECTED NOT DETECTED Final   Pseudomonas aeruginosa NOT DETECTED NOT DETECTED Final   Candida albicans NOT DETECTED NOT DETECTED Final   Candida glabrata NOT DETECTED NOT DETECTED Final   Candida krusei NOT DETECTED NOT DETECTED Final   Candida parapsilosis NOT DETECTED NOT DETECTED Final   Candida tropicalis NOT DETECTED NOT DETECTED Final    Comment: Performed at [facility], [place] [address], [place], [place] [address]  MRSA PCR Screening     Status: None   Collection Time: 06/29/17  4:12 AM  Result Value Ref Range Status   MRSA by PCR NEGATIVE NEGATIVE Final    Comment:        The GeneXpert MRSA Assay (FDA approved for NASAL specimens only), is one component of a comprehensive MRSA colonization surveillance program. It is not intended to diagnose MRSA infection nor to guide or monitor treatment for MRSA infections.   Culture, blood (routine x 2)     Status: None (Preliminary result)   Collection Time: 06/29/17  6:58 PM  Result Value Ref Range Status   Specimen Description BLOOD RIGHT HAND  Final   Special Requests   Final    BOTTLES DRAWN AEROBIC ONLY Blood Culture adequate volume   Culture NO GROWTH 3 DAYS  Final   Report Status PENDING  Incomplete  Culture, blood (routine x 2)     Status: None (Preliminary result)   Collection Time: 06/29/17  7:09 PM  Result Value Ref Range  Status   Specimen Description BLOOD RIGHT ANTECUBITAL  Final   Special Requests   Final    BOTTLES DRAWN AEROBIC AND ANAEROBIC Blood Culture adequate volume   Culture NO GROWTH 3 DAYS  Final   Report Status PENDING  Incomplete  Aerobic/Anaerobic Culture (surgical/deep wound)     Status: None (Preliminary result)   Collection Time: 06/30/17  1:31 PM  Result Value Ref Range Status   Specimen Description ABSCESS  Final   Special Requests HEPATIC ABSCESS DRAINAGE  Final   Gram Stain   Final    ABUNDANT WBC PRESENT, PREDOMINANTLY PMN ABUNDANT GRAM POSITIVE COCCI ABUNDANT GRAM POSITIVE RODS    Culture TOO YOUNG TO READ  Final   Report Status PENDING  Incomplete  C difficile quick scan w PCR reflex     Status: None   Collection Time: 07/01/17  8:00 PM  Result Value Ref Range Status   C Diff antigen NEGATIVE NEGATIVE Final   C Diff toxin NEGATIVE NEGATIVE Final   C Diff interpretation No C. difficile detected.  Final    Impression/Plan:  1. Liver abscess - draining.  Cultures with nothing positive yet to date.  On ceftriaxone and metronidazole.   Will continue and will need prolonged IV ceftraixone and oral flagyl.   Ok from [facility] standpoint for picc line for above. Duration depends on draining, 2 weeks likely minimum.    2.  Cholecystitis - per surgery, no emergent surgical indication.  Following.  3.  DM - counseled on the need for tight blood sugar control.  4.  Leukocytosis - improved from yesterday, continuing to monitor

## 2017-07-03 LAB — CBC
HEMATOCRIT: 36.5 % — AB (ref 39.0–52.0)
HEMOGLOBIN: 11.6 g/dL — AB (ref 13.0–17.0)
MCH: 26.1 pg (ref 26.0–34.0)
MCHC: 31.8 g/dL (ref 30.0–36.0)
MCV: 82.2 fL (ref 78.0–100.0)
Platelets: 557 10*3/uL — ABNORMAL HIGH (ref 150–400)
RBC: 4.44 MIL/uL (ref 4.22–5.81)
RDW: 15 % (ref 11.5–15.5)
WBC: 20.9 10*3/uL — ABNORMAL HIGH (ref 4.0–10.5)

## 2017-07-03 LAB — RENAL FUNCTION PANEL
ALBUMIN: 1.6 g/dL — AB (ref 3.5–5.0)
ANION GAP: 9 (ref 5–15)
BUN: 5 mg/dL — ABNORMAL LOW (ref 6–20)
CALCIUM: 8.5 mg/dL — AB (ref 8.9–10.3)
CO2: 26 mmol/L (ref 22–32)
Chloride: 102 mmol/L (ref 101–111)
Creatinine, Ser: 0.74 mg/dL (ref 0.61–1.24)
GLUCOSE: 192 mg/dL — AB (ref 65–99)
PHOSPHORUS: 4.4 mg/dL (ref 2.5–4.6)
Potassium: 3.9 mmol/L (ref 3.5–5.1)
Sodium: 137 mmol/L (ref 135–145)

## 2017-07-03 LAB — GLUCOSE, CAPILLARY
GLUCOSE-CAPILLARY: 175 mg/dL — AB (ref 65–99)
GLUCOSE-CAPILLARY: 235 mg/dL — AB (ref 65–99)
GLUCOSE-CAPILLARY: 165 mg/dL — AB (ref 65–99)
GLUCOSE-CAPILLARY: 163 mg/dL — AB (ref 65–99)

## 2017-07-03 LAB — CULTURE, BLOOD (ROUTINE X 2): Special Requests: ADEQUATE

## 2017-07-03 MED ORDER — LIVING WELL WITH DIABETES BOOK
Freq: Once | Status: AC
  Administered 2017-07-04
  Filled 2017-07-03 (×2): qty 1

## 2017-07-03 MED ORDER — FLUCONAZOLE 200 MG PO TABS
200.0000 mg | ORAL_TABLET | Freq: Every day | ORAL | Status: DC
  Administered 2017-07-03 – 2017-07-04 (×2): 200 mg via ORAL
  Filled 2017-07-03 (×3): qty 1

## 2017-07-03 MED ORDER — BACID PO TABS
2.0000 | ORAL_TABLET | Freq: Three times a day (TID) | ORAL | Status: DC
  Administered 2017-07-03 – 2017-07-13 (×29): 2 via ORAL
  Filled 2017-07-03 (×34): qty 2

## 2017-07-03 MED ORDER — PANTOPRAZOLE SODIUM 40 MG PO TBEC
40.0000 mg | DELAYED_RELEASE_TABLET | Freq: Every day | ORAL | Status: DC
  Administered 2017-07-03 – 2017-07-13 (×11): 40 mg via ORAL
  Filled 2017-07-03 (×11): qty 1

## 2017-07-03 MED ORDER — CHLORHEXIDINE GLUCONATE CLOTH 2 % EX PADS
6.0000 | MEDICATED_PAD | Freq: Every day | CUTANEOUS | Status: DC
  Administered 2017-07-03: 6 via TOPICAL

## 2017-07-03 MED ORDER — SODIUM CHLORIDE 0.9% FLUSH: 10.0000 mL | INTRAVENOUS | Status: DC | PRN

## 2017-07-03 MED ORDER — SODIUM CHLORIDE 0.9% FLUSH
10.0000 mL | Freq: Two times a day (BID) | INTRAVENOUS | Status: DC
  Administered 2017-07-03 – 2017-07-06 (×6): 10 mL
  Administered 2017-07-06: 30 mL
  Administered 2017-07-07 – 2017-07-13 (×7): 10 mL

## 2017-07-03 NOTE — Progress Notes (Signed)
Referring Physician(s): Dr Jake Samples  Supervising Physician: Arne Cleveland  Patient Status:  [facility] - In-pt  Chief Complaint:  10/25: Technically successful CT guided placed of a 10 Fr drainage catheter placement into the dominant abscess within the superior lateral aspect of the right lobe of the liver. Technically successful CT guided placed of a 10 Fr drainage catheter placement into the dominant abscess within the more inferior anterior aspect of the right lobe of the liver  Subjective:  Feeling some better Less pain OP minimal x 48 hrs   Allergies: Patient has no known allergies.  Medications: Prior to Admission medications   Medication Sig Start Date End Date Taking? Authorizing Provider  oxyCODONE-acetaminophen (PERCOCET) 10-325 MG tablet Take 1 tablet by mouth every 4 (four) hours as needed for pain. 06/15/17  Yes Sanjuana Kava, MD  albuterol (PROVENTIL HFA;VENTOLIN HFA) 108 (90 Base) MCG/ACT inhaler Inhale 2 puffs into the lungs every 6 (six) hours as needed for wheezing or shortness of breath. 06/28/17   [name], PA-C  benzonatate (TESSALON PERLES) 100 MG capsule 1-2 po q 8 hour prn cough Patient taking differently: Take 100-200 mg by mouth 3 (three) times daily as needed for cough. 1-2 po q 8 hour prn cough 06/28/17   [name], PA-C  cephALEXin (KEFLEX) 500 MG capsule Take 1 capsule (500 mg total) by mouth 4 (four) times daily. 06/28/17   [name], PA-C  metFORMIN (GLUCOPHAGE) 1000 MG tablet Take 1 tablet (1,000 mg total) by mouth 2 (two) times daily with a meal. 06/28/17   [name], PA-C     Vital Signs: BP (!) 142/96 (BP Location: Left Wrist)   Pulse 80   Temp 97.9 F (36.6 C) (Oral)   Resp 16   Ht 5\' 7"  (1.702 m)   Wt (!) 375 lb 12.8 oz (170.5 kg)   SpO2 96%   BMI 58.86 kg/m   Physical Exam  Constitutional: He is oriented to person, place, and time.  Abdominal: Soft. There is tenderness.  Musculoskeletal: Normal range  of motion.  Neurological: He is alert and oriented to person, place, and time.  Skin: Skin is warm and dry.  Wbc trending down OP minimal from both drains Bloody  Fluid   FEW CANDIDA ALBICANS  MODERATE STREPTOCOCCUS GROUP C  MODERATE STREPTOCOCCUS GROUP F  NO ANAEROBES ISOLATED; CULTURE IN PROGRESS FOR 5 DAYS     Nursing note and vitals reviewed.   Imaging: Ct Image Guided Drainage By Percutaneous Catheter  Result Date: 06/30/2017 INDICATION: History of hepatic abscesses. Please perform ultrasound/CT-guided hepatic abscess drainage catheter placement. EXAM: ULTRASOUND AND CT-GUIDED HEPATIC ABSCESS DRAINAGE CATHETER PLACEMENT x2 COMPARISON:  CT abdomen pelvis - 06/28/2017 MEDICATIONS: The patient is currently admitted to the hospital and receiving intravenous antibiotics. The antibiotics were administered within an appropriate time frame prior to the initiation of the procedure. ANESTHESIA/SEDATION: Moderate (conscious) sedation was employed during this procedure. A total of Versed 7 mg and Fentanyl 400 mcg, Dilaudid 2 mg IV and Demerol 50 mg IV was administered intravenously. Moderate Sedation Time: 25 minutes. The patient's level of consciousness and vital signs were monitored continuously by radiology nursing throughout the procedure under my direct supervision. CONTRAST:  None COMPLICATIONS: SIR LEVEL B - Normal therapy, includes overnight admission for observation. The patient developed rigors following placement of both hepatic abscess drainage catheters which was treated with the administration of 50 mg of Demerol IV. PROCEDURE: Informed written consent was obtained from the patient after a discussion  of the risks, benefits and alternatives to treatment. The patient was placed supine on the CT gantry and a pre procedural CT was performed re-demonstrating the known hepatic abscesses with dominant ill-defined component within the superolateral aspect the right lobe of the liver measuring  approximately 5.3 x 4.9 cm (image 40, series 2), additional ill-defined component with the subcapsular inferior aspect of the right lobe of the liver measuring approximately 5.2 x 4.4 cm (image 63, series 2) an ill-defined slightly hyperattenuating fluid collection within the porta hepatis measuring approximately 8.5 x 4.4 cm (image 66, series 2). All 3 of these fluid collections were identified sonographically. The right superolateral abdomen as well as the midline of the upper abdomen was prepped and draped in usual sterile fashion. The overlying soft tissues were anesthetized with 1% lidocaine with epinephrine. Under direct ultrasound guidance, each hypoechoic collection were sequentially accessed with an 18 gauge trocar needle. Both hepatic samples yielded the aspiration of a small amount of purulent fluid, while the porta hepatis collection yielded a small amount of bilious appearing fluid. Short Amplatz wires were placed at all locations. CT scanning demonstrated appropriate positioning of the Amplatz wires within both hepatic abscesses, however the wire was not appropriately coiled within the porta hepatis collection. Next, both hepatic abscess drainage catheters were placed after the tracks were serially dilated allowing placement of a 10 [place] all-purpose drainage catheters. As patient was complaining of discomfort lying on the table and then developed rigors, additional intervention was not performed at this time to attempt placement of a drainage catheter into the perihepatic abscess. Limited postprocedural scanning was performed demonstrating appropriate positioning of both hepatic abscess drainage catheters. Approximately 15 cc of purulent fluid was aspirated from the superolateral abscess while approximately 35 cc was aspirated from the abscess within the anterior aspect the right lobe of the liver. Both drainage catheters were secured at the skin entrance site within interrupted suture and connected  to JP bulbs. Dressings were placed. IMPRESSION: 1. Successful ultrasound and CT-guided drainage catheter placement into dominant abscess within the superolateral aspect the right lobe of the liver yielding 15 cc of purulent material. 2. Successful ultrasound and CT-guided drainage catheter placement into dominant abscess within the anterior inferior aspect the right lobe of the liver yielding 35 cc of purulent material. 3. Attempted though ultimately unsuccessful placement of a percutaneous drainage catheter into the ill-defined suspected abscess within the porta hepatis abscess, secondary to patient's inability to tolerate the procedure as well as development of rigors following placement of the 2 additional hepatic abscess drainage catheters. 4. A representative sample of aspirated purulent material was sent to the laboratory for analysis. PLAN: - As the suspected abscess within the dome of the right lobe of the liver and potential abscess within the porta hepatis remain undrained, would have a low threshold to repeat an IV only abdominal CT if patient has infectious symptoms do not defervesce within the next several days, however note, both collections may be NOT amenable to image guided drainage secondary to location and patient body habitus. Electronically Signed   By: Sandi Mariscal M.D.   On: 06/30/2017 14:55   Ct Image Guided Drainage By Percutaneous Catheter  Result Date: 06/30/2017 INDICATION: History of hepatic abscesses. Please perform ultrasound/CT-guided hepatic abscess drainage catheter placement. EXAM: ULTRASOUND AND CT-GUIDED HEPATIC ABSCESS DRAINAGE CATHETER PLACEMENT x2 COMPARISON:  CT abdomen pelvis - 06/28/2017 MEDICATIONS: The patient is currently admitted to the hospital and receiving intravenous antibiotics. The antibiotics were administered within an appropriate  time frame prior to the initiation of the procedure. ANESTHESIA/SEDATION: Moderate (conscious) sedation was employed during this  procedure. A total of Versed 7 mg and Fentanyl 400 mcg, Dilaudid 2 mg IV and Demerol 50 mg IV was administered intravenously. Moderate Sedation Time: 25 minutes. The patient's level of consciousness and vital signs were monitored continuously by radiology nursing throughout the procedure under my direct supervision. CONTRAST:  None COMPLICATIONS: SIR LEVEL B - Normal therapy, includes overnight admission for observation. The patient developed rigors following placement of both hepatic abscess drainage catheters which was treated with the administration of 50 mg of Demerol IV. PROCEDURE: Informed written consent was obtained from the patient after a discussion of the risks, benefits and alternatives to treatment. The patient was placed supine on the CT gantry and a pre procedural CT was performed re-demonstrating the known hepatic abscesses with dominant ill-defined component within the superolateral aspect the right lobe of the liver measuring approximately 5.3 x 4.9 cm (image 40, series 2), additional ill-defined component with the subcapsular inferior aspect of the right lobe of the liver measuring approximately 5.2 x 4.4 cm (image 63, series 2) an ill-defined slightly hyperattenuating fluid collection within the porta hepatis measuring approximately 8.5 x 4.4 cm (image 66, series 2). All 3 of these fluid collections were identified sonographically. The right superolateral abdomen as well as the midline of the upper abdomen was prepped and draped in usual sterile fashion. The overlying soft tissues were anesthetized with 1% lidocaine with epinephrine. Under direct ultrasound guidance, each hypoechoic collection were sequentially accessed with an 18 gauge trocar needle. Both hepatic samples yielded the aspiration of a small amount of purulent fluid, while the porta hepatis collection yielded a small amount of bilious appearing fluid. Short Amplatz wires were placed at all locations. CT scanning demonstrated  appropriate positioning of the Amplatz wires within both hepatic abscesses, however the wire was not appropriately coiled within the porta hepatis collection. Next, both hepatic abscess drainage catheters were placed after the tracks were serially dilated allowing placement of a 10 [place] all-purpose drainage catheters. As patient was complaining of discomfort lying on the table and then developed rigors, additional intervention was not performed at this time to attempt placement of a drainage catheter into the perihepatic abscess. Limited postprocedural scanning was performed demonstrating appropriate positioning of both hepatic abscess drainage catheters. Approximately 15 cc of purulent fluid was aspirated from the superolateral abscess while approximately 35 cc was aspirated from the abscess within the anterior aspect the right lobe of the liver. Both drainage catheters were secured at the skin entrance site within interrupted suture and connected to JP bulbs. Dressings were placed. IMPRESSION: 1. Successful ultrasound and CT-guided drainage catheter placement into dominant abscess within the superolateral aspect the right lobe of the liver yielding 15 cc of purulent material. 2. Successful ultrasound and CT-guided drainage catheter placement into dominant abscess within the anterior inferior aspect the right lobe of the liver yielding 35 cc of purulent material. 3. Attempted though ultimately unsuccessful placement of a percutaneous drainage catheter into the ill-defined suspected abscess within the porta hepatis abscess, secondary to patient's inability to tolerate the procedure as well as development of rigors following placement of the 2 additional hepatic abscess drainage catheters. 4. A representative sample of aspirated purulent material was sent to the laboratory for analysis. PLAN: - As the suspected abscess within the dome of the right lobe of the liver and potential abscess within the porta hepatis  remain undrained, would have a  low threshold to repeat an IV only abdominal CT if patient has infectious symptoms do not defervesce within the next several days, however note, both collections may be NOT amenable to image guided drainage secondary to location and patient body habitus. Electronically Signed   By: Sandi Mariscal M.D.   On: 06/30/2017 14:55    Labs:  CBC:  Recent Labs  06/30/17 0709 07/01/17 0318 07/02/17 0522 07/03/17 0235  WBC 18.8* 33.1* 28.3* 20.9*  HGB 11.8* 10.7* 11.4* 11.6*  HCT 36.2* 34.0* 35.3* 36.5*  PLT 584* 517* 520* 557*    COAGS:  Recent Labs  06/30/17 0709  INR 1.24    BMP:  Recent Labs  06/30/17 0709 07/01/17 0318 07/02/17 0522 07/03/17 0235  NA 133* 135 135 137  K 3.7 3.7 3.5 3.9  CL 96* 101 101 102  CO2 27 23 23 26   GLUCOSE 219* 232* 221* 192*  BUN <5* 8 7 5*  CALCIUM 8.2* 8.1* 8.4* 8.5*  CREATININE 0.80 0.86 0.71 0.74  GFRNONAA >60 >60 >60 >60  GFRAA >60 >60 >60 >60    LIVER FUNCTION TESTS:  Recent Labs  06/10/17 1650 06/28/17 1232 06/30/17 0709 07/02/17 0522 07/03/17 0235  BILITOT 0.9 3.3* 1.7* 0.9  --   AST 23 115* 38 25  --   ALT 32 73* 40 27  --   ALKPHOS 150* 474* 406* 337*  --   PROT 7.7 7.9 7.5 7.8  --   ALBUMIN 2.9* 2.2* 1.7* 1.6*  1.6* 1.6*    Assessment and Plan:  Right intra hepatic and periGb abscess drains placed 10/25 Minimal OP now Wbc trending down Will follow  Electronically Signed: Kane Kusek A, PA-C 07/03/2017, 10:31 AM   I spent a total of 15 Minutes at the the patient's bedside AND on the patient's hospital floor or unit, greater than 50% of which was counseling/coordinating care for right abd abscesses drains

## 2017-07-03 NOTE — Progress Notes (Addendum)
PROGRESS NOTE    [name]  WRU:[id number] DOB: [birth year] DOA: 06/28/2017 PCP: [name], PA-C   Brief Narrative: 51 year old morbidly obese male with history of hypertension, diabetes, gout who was sent from [facility] for the management of intra-abdominal abscess. Patient reported that he has not been feeling well for about a month. He was prescribed antibiotics by his PCP for upper respiratory tract infection and took for a week without any improvement.he presented with nausea, abdominal pain and not feeling well. In the ER CT scan consistent with multiple liver abscesses and possible cholecystitis. General surgeon was contacted and patient was transferred to [facility] for further evaluation. S/p RUQ 2 perc drain placed by IR on 10/25.  Assessment & Plan:   # Sepsis due to multiple intrahepatic abscesses: -s/p IR s/p  percutaneous drainage x2 .  -drain management as per IR and surgeon. Draining well and abd pain is improving per patient -ID consult appreciated.Currently on ceftriaxone and Flagyl. Follow up final culture result. Leukocytosis improving.  -plan for at least 2 weeks of IV antibiotics per ID, PICC line ordered. Discussed with the patient and he agreed with the plan.  #Group F streptococcal bacteremia: -Follow-up repeat culture, negative so far. -continue current antibiotics. -echocardiogram with no vegetation.  -IV Ceftriaxone. -probiotics ordered  # Diarrhea: Likely due to antibiotics. C diff. Negative.  Imodium prn.  # type 2 diabetes: Patient will go home with insulin.  -continue Lantus to 20 units. Continue sliding scale. Continue diabetic teaching.monitor blood sugar level  # normocytic anemia/iron deficiency anemia: No sign of bleeding. Hold on iron supplement because of bacteremia. Monitor CBC.  PT/OT recommended CIR. I discussed with the patient and he agreed. Inpatient rehabilitation consult requested.  DVT prophylaxis:lovenox sq Code Status:full  code Family Communication:no family at bedside Disposition Plan:admitted    Consultants:   Surgery  IR  ID  Procedures:2 abdo drain Antimicrobials:ceftriaxone and flagyl  Subjective: She and examined at bedside.abdomen pain is better but is still having some discomfort. Draining well. No nausea vomiting. Denied chest pain or shortness of breath. Objective: Vitals:   07/03/17 0000 07/03/17 0322 07/03/17 0409 07/03/17 0732  BP: 137/86  134/83 (!) 142/96  Pulse: 84  80   Resp: 18  16   Temp:  98 F (36.7 C)  97.9 F (36.6 C)  TempSrc:  Oral  Oral  SpO2: 96%  96%   Weight:      Height:        Intake/Output Summary (Last 24 hours) at 07/03/17 1017 Last data filed at 07/03/17 0930  Gross per 24 hour  Intake             1090 ml  Output             1675 ml  Net             -585 ml   Filed Weights   06/28/17 1535 06/29/17 0403  Weight: (!) 170.1 kg (375 lb) (!) 170.5 kg (375 lb 12.8 oz)    Examination:  General exam: please male lying in bed Respiratory system: clear bilateral, no wheezing or crackle Cardiovascular system: regular rate rhythm, S1 and S2 normal. No pedal edema. Gastrointestinal system: abdomen soft, nontender. Right-sided drains draining well. Bowel sound positive. Central nervous system: Alert and oriented. No focal neurological deficits. Skin: No rashes, lesions or ulcers Psychiatry: Judgement and insight appear normal. Mood & affect appropriate.     Data Reviewed: I have personally reviewed following  labs and imaging studies  CBC:  Recent Labs Lab 06/28/17 1232 06/29/17 0730 06/30/17 0709 07/01/17 0318 07/02/17 0522 07/03/17 0235  WBC 21.9* 17.7* 18.8* 33.1* 28.3* 20.9*  NEUTROABS 16.7*  --   --   --   --   --   HGB 11.9* 10.6* 11.8* 10.7* 11.4* 11.6*  HCT 36.8* 33.4* 36.2* 34.0* 35.3* 36.5*  MCV 82.5 81.5 82.3 81.7 81.1 82.2  PLT 491* 487* 584* 517* 520* 846*   Basic Metabolic Panel:  Recent Labs Lab 06/28/17 1232  06/29/17 0730 06/30/17 0709 07/01/17 0318 07/02/17 0522 07/03/17 0235  NA 138  --  133* 135 135 137  K 4.2  --  3.7 3.7 3.5 3.9  CL 101  --  96* 101 101 102  CO2 26  --  27 23 23 26   GLUCOSE 239*  --  219* 232* 221* 192*  BUN 7  --  <5* 8 7 5*  CREATININE 0.71 0.69 0.80 0.86 0.71 0.74  CALCIUM 8.9  --  8.2* 8.1* 8.4* 8.5*  MG  --   --   --  1.9  --   --   PHOS  --   --   --   --  3.5 4.4   GFR: Estimated Creatinine Clearance: 164.8 mL/min (by C-G formula based on SCr of 0.74 mg/dL). Liver Function Tests:  Recent Labs Lab 06/28/17 1232 06/30/17 0709 07/02/17 0522 07/03/17 0235  AST 115* 38 25  --   ALT 73* 40 27  --   ALKPHOS 474* 406* 337*  --   BILITOT 3.3* 1.7* 0.9  --   PROT 7.9 7.5 7.8  --   ALBUMIN 2.2* 1.7* 1.6*  1.6* 1.6*    Recent Labs Lab 06/28/17 1849  LIPASE 20   No results for input(s): AMMONIA in the last 168 hours. Coagulation Profile:  Recent Labs Lab 06/30/17 0709  INR 1.24   Cardiac Enzymes: No results for input(s): CKTOTAL, CKMB, CKMBINDEX, TROPONINI in the last 168 hours. BNP (last 3 results) No results for input(s): PROBNP in the last 8760 hours. HbA1C: No results for input(s): HGBA1C in the last 72 hours. CBG:  Recent Labs Lab 07/02/17 0046 07/02/17 0520 07/02/17 1212 07/02/17 2037 07/02/17 2353  GLUCAP 232* 212* 254* 198* 223*   Lipid Profile: No results for input(s): CHOL, HDL, LDLCALC, TRIG, CHOLHDL, LDLDIRECT in the last 72 hours. Thyroid Function Tests: No results for input(s): TSH, T4TOTAL, FREET4, T3FREE, THYROIDAB in the last 72 hours. Anemia Panel:  Recent Labs  06/30/17 1605  FERRITIN 1,155*  TIBC 130*  IRON 11*   Sepsis Labs:  Recent Labs Lab 06/28/17 1849 06/28/17 2017  LATICACIDVEN 1.4 1.5    Recent Results (from the past 240 hour(s))  Urine culture     Status: None   Collection Time: 06/28/17  6:28 PM  Result Value Ref Range Status   Specimen Description URINE, RANDOM  Final   Special  Requests NONE  Final   Culture   Final    NO GROWTH Performed at [facility], [place] [address], [place], [place] [address]    Report Status 06/30/2017 FINAL  Final  Blood culture (routine x 2)     Status: Abnormal (Preliminary result)   Collection Time: 06/28/17  6:49 PM  Result Value Ref Range Status   Specimen Description RIGHT ANTECUBITAL  Final   Special Requests   Final    BOTTLES DRAWN AEROBIC AND ANAEROBIC Blood Culture adequate volume  Culture  Setup Time   Final    GRAM POSITIVE COCCI Gram Stain Report Called to,Read Back By and Verified With: ANGELICA DAVIS. @ 4098 ON [id number] BY HENDERSON L. ANAEROBIC AEROBIC Gram Stain Report Called to,Read Back By and Verified With: MIZE,J @ 1930 ON 10.24.18 BY BOWMAN,L CRITICAL RESULT CALLED TO, READ BACK BY AND VERIFIED WITH: J.LEDFORD PHARMD 06/30/17 0207 L.CHAMPION    Culture (A)  Final    STREPTOCOCCUS GROUP F SUSCEPTIBILITIES TO FOLLOW Performed at [facility], [place] [address], [place], [place] [address]    Report Status PENDING  Incomplete  Blood culture (routine x 2)     Status: Abnormal (Preliminary result)   Collection Time: 06/28/17  6:49 PM  Result Value Ref Range Status   Specimen Description BLOOD RIGHT HAND  Final   Special Requests   Final    BOTTLES DRAWN AEROBIC AND ANAEROBIC Blood Culture adequate volume   Culture  Setup Time   Final    GRAM POSITIVE COCCI Gram Stain Report Called to,Read Back By and Verified With: ANGELICA DAVIS @ 3086 ON [id number] BY HENDERSON L. ANAEROBIC AEROBIC Gram Stain Report Called to,Read Back By and Verified With: MIZE, J @ 1930 ON 10.24.18 BY BOWMAN,L    Culture STREPTOCOCCUS GROUP F (A)  Final   Report Status PENDING  Incomplete  Blood Culture ID Panel (Reflexed)     Status: Abnormal   Collection Time: 06/28/17  6:49 PM  Result Value Ref Range Status   Enterococcus species NOT DETECTED NOT DETECTED Final   Listeria monocytogenes NOT DETECTED NOT DETECTED Final    Staphylococcus species NOT DETECTED NOT DETECTED Final   Staphylococcus aureus NOT DETECTED NOT DETECTED Final   Streptococcus species DETECTED (A) NOT DETECTED Final    Comment: Not Enterococcus species, Streptococcus agalactiae, Streptococcus pyogenes, or Streptococcus pneumoniae. CRITICAL RESULT CALLED TO, READ BACK BY AND VERIFIED WITH: J.LEDFORD PHARMD 06/30/17 0207 L.CHAMPION    Streptococcus agalactiae NOT DETECTED NOT DETECTED Final   Streptococcus pneumoniae NOT DETECTED NOT DETECTED Final   Streptococcus pyogenes NOT DETECTED NOT DETECTED Final   Acinetobacter baumannii NOT DETECTED NOT DETECTED Final   Enterobacteriaceae species NOT DETECTED NOT DETECTED Final   Enterobacter cloacae complex NOT DETECTED NOT DETECTED Final   Escherichia coli NOT DETECTED NOT DETECTED Final   Klebsiella oxytoca NOT DETECTED NOT DETECTED Final   Klebsiella pneumoniae NOT DETECTED NOT DETECTED Final   Proteus species NOT DETECTED NOT DETECTED Final   Serratia marcescens NOT DETECTED NOT DETECTED Final   Haemophilus influenzae NOT DETECTED NOT DETECTED Final   Neisseria meningitidis NOT DETECTED NOT DETECTED Final   Pseudomonas aeruginosa NOT DETECTED NOT DETECTED Final   Candida albicans NOT DETECTED NOT DETECTED Final   Candida glabrata NOT DETECTED NOT DETECTED Final   Candida krusei NOT DETECTED NOT DETECTED Final   Candida parapsilosis NOT DETECTED NOT DETECTED Final   Candida tropicalis NOT DETECTED NOT DETECTED Final    Comment: Performed at [facility], [place] [address], [place], [place] [address]  MRSA PCR Screening     Status: None   Collection Time: 06/29/17  4:12 AM  Result Value Ref Range Status   MRSA by PCR NEGATIVE NEGATIVE Final    Comment:        The GeneXpert MRSA Assay (FDA approved for NASAL specimens only), is one component of a comprehensive MRSA colonization surveillance program. It is not intended to diagnose MRSA infection nor to guide or monitor treatment  for  MRSA infections.   Culture, blood (routine x 2)     Status: None (Preliminary result)   Collection Time: 06/29/17  6:58 PM  Result Value Ref Range Status   Specimen Description BLOOD RIGHT HAND  Final   Special Requests   Final    BOTTLES DRAWN AEROBIC ONLY Blood Culture adequate volume   Culture NO GROWTH 3 DAYS  Final   Report Status PENDING  Incomplete  Culture, blood (routine x 2)     Status: None (Preliminary result)   Collection Time: 06/29/17  7:09 PM  Result Value Ref Range Status   Specimen Description BLOOD RIGHT ANTECUBITAL  Final   Special Requests   Final    BOTTLES DRAWN AEROBIC AND ANAEROBIC Blood Culture adequate volume   Culture NO GROWTH 3 DAYS  Final   Report Status PENDING  Incomplete  Aerobic/Anaerobic Culture (surgical/deep wound)     Status: None (Preliminary result)   Collection Time: 06/30/17  1:31 PM  Result Value Ref Range Status   Specimen Description ABSCESS  Final   Special Requests HEPATIC ABSCESS DRAINAGE  Final   Gram Stain   Final    ABUNDANT WBC PRESENT, PREDOMINANTLY PMN ABUNDANT GRAM POSITIVE COCCI ABUNDANT GRAM POSITIVE RODS    Culture   Final    FEW CANDIDA ALBICANS MODERATE STREPTOCOCCUS GROUP C MODERATE STREPTOCOCCUS GROUP F NO ANAEROBES ISOLATED; CULTURE IN PROGRESS FOR 5 DAYS    Report Status PENDING  Incomplete  C difficile quick scan w PCR reflex     Status: None   Collection Time: 07/01/17  8:00 PM  Result Value Ref Range Status   C Diff antigen NEGATIVE NEGATIVE Final   C Diff toxin NEGATIVE NEGATIVE Final   C Diff interpretation No C. difficile detected.  Final         Radiology Studies: No results found.      Scheduled Meds: . enoxaparin (LOVENOX) injection  40 mg Subcutaneous Daily  .  HYDROmorphone (DILAUDID) injection  1 mg Intravenous Once  . insulin aspart  0-9 Units Subcutaneous TID WC  . insulin glargine  20 Units Subcutaneous QHS  . lactobacillus acidophilus  2 tablet Oral TID  . metroNIDAZOLE   500 mg Oral Q8H  . sodium chloride flush  5 mL Intravenous Q8H   Continuous Infusions: . sodium chloride Stopped (07/02/17 0343)  . cefTRIAXone (ROCEPHIN)  IV Stopped (07/02/17 2057)     LOS: 5 days    Dron Tanna Furry, MD [facility] Pager [phone]  If 7PM-7AM, please contact night-coverage [email or web address] Password [email or web address] 07/03/2017, 10:17 AM

## 2017-07-03 NOTE — Progress Notes (Signed)
Physical Therapy Treatment Patient Details Name: [name] MRN: [id number] DOB: Oct 28, 1965 Today's Date: 07/03/2017    History of Present Illness Patient is a 51 yo male admitted 06/28/17 with RUQ abdominal pain.  Patient with hepatic abscess and cholecystitis, with percutaneous drains placed 06/30/17.     PMH:  obesity, DM, HTN, gout, OA    PT Comments    Patient making good gains with mobility today. Continue to recommend Inpatient Rehab stay prior to return home with wife.   Follow Up Recommendations  CIR     Equipment Recommendations  Rolling walker with 5" wheels;3in1 (PT)    Recommendations for Other Services       Precautions / Restrictions Precautions Precautions: None Precaution Comments: 2 percutaneous drains Restrictions Weight Bearing Restrictions: No    Mobility  Bed Mobility Overal bed mobility: Needs Assistance Bed Mobility: Supine to Sit;Sit to Supine     Supine to sit: Min assist Sit to supine: Mod assist   General bed mobility comments: Assist to bring BLE's onto bed.  Transfers Overall transfer level: Needs assistance Equipment used: Rolling walker (2 wheeled) Transfers: Sit to/from Stand Sit to Stand: Min assist;+2 safety/equipment         General transfer comment: for steadying balance due to pain  Ambulation/Gait Ambulation/Gait assistance: Min assist;+2 safety/equipment Ambulation Distance (Feet): 84 Feet Assistive device: Rolling walker (2 wheeled) Gait Pattern/deviations: Step-through pattern;Decreased step length - right;Decreased step length - left;Decreased stride length;Shuffle;Trunk flexed Gait velocity: decreased Gait velocity interpretation: Below normal speed for age/gender General Gait Details: Verbal cues to keep feet inside RW, and to stand upright.  Patient with slow gait speed.  Patient able to stand by sink and wash own bottom with min support for balance.   Stairs            Wheelchair Mobility     Modified Rankin (Stroke Patients Only)       Balance Overall balance assessment: Needs assistance Sitting-balance support: Feet supported;Bilateral upper extremity supported Sitting balance-Leahy Scale: Fair     Standing balance support: Single extremity supported Standing balance-Leahy Scale: Poor                              Cognition Arousal/Alertness: Awake/alert Behavior During Therapy: WFL for tasks assessed/performed Overall Cognitive Status: Within Functional Limits for tasks assessed                                        Exercises      General Comments        Pertinent Vitals/Pain Pain Assessment: 0-10 Pain Score: 8  Pain Location: abdomen, R side Pain Descriptors / Indicators: Aching;Grimacing;Guarding Pain Intervention(s): Monitored during session;Repositioned    Home Living Family/patient expects to be discharged to:: Private residence Living Arrangements: Other (Comment) (brother) Available Help at Discharge: Family;Available 24 hours/day Type of Home: House Home Access: Level entry   Home Layout: One level Home Equipment: None      Prior Function Level of Independence: Independent      Comments: Drives   PT Goals (current goals can now be found in the care plan section) Acute Rehab PT Goals Patient Stated Goal: To get stronger Progress towards PT goals: Progressing toward goals    Frequency    Min 3X/week      PT Plan Current plan remains appropriate  Co-evaluation              AM-PAC PT "6 Clicks" Daily Activity  Outcome Measure  Difficulty turning over in bed (including adjusting bedclothes, sheets and blankets)?: A Little Difficulty moving from lying on back to sitting on the side of the bed? : Unable Difficulty sitting down on and standing up from a chair with arms (e.g., wheelchair, bedside commode, etc,.)?: A Little Help needed moving to and from a bed to chair (including a  wheelchair)?: A Little Help needed walking in hospital room?: A Little Help needed climbing 3-5 steps with a railing? : A Lot 6 Click Score: 15    End of Session   Activity Tolerance: Patient limited by fatigue;Patient limited by pain Patient left: in bed;with call bell/phone within reach Nurse Communication: Mobility status PT Visit Diagnosis: Unsteadiness on feet (R26.81);Muscle weakness (generalized) (M62.81);Pain Pain - Right/Left: Right Pain - part of body:  (RUQ abdomen)     Time: 0973-5329 PT Time Calculation (min) (ACUTE ONLY): 35 min  Charges:  $Gait Training: 8-22 mins $Therapeutic Activity: 8-22 mins                    G Codes:       Carita Pian. Sanjuana Kava, [facility] Acute Rehab Services Pager [place] 07/03/2017, 5:47 PM

## 2017-07-03 NOTE — Evaluation (Signed)
Occupational Therapy Evaluation Patient Details Name: [name] MRN: [id number] DOB: [birth year] Today's Date: 07/03/2017    History of Present Illness Patient is a 51 yo male admitted 06/28/17 with RUQ abdominal pain.  Patient with hepatic abscess and cholecystitis, with percutaneous drains placed 06/30/17.     PMH:  obesity, DM, HTN, gout, OA   Clinical Impression   Pt reports he was independent with ADL PTA. Currently pt requires mod-max assist for ADL due to pain. Pt able to perform sit to stand from EOB with few side step toward [facility] for repositioning with min assist. Pt presenting with impaired balance, generalized weakness, decreased activity tolerance, and pain impacting his independence and safety with ADL and functional mobility. Recommending CIR level therapies to maximize independence and safety with ADL and functional mobility prior to return home. Pt would benefit from continued skilled OT to address established goals.    Follow Up Recommendations  CIR;Supervision/Assistance - 24 hour    Equipment Recommendations  Other (comment) (TBD at next venue)    Recommendations for Other Services       Precautions / Restrictions Precautions Precautions: None Precaution Comments: 2 percutaneous drains Restrictions Weight Bearing Restrictions: No      Mobility Bed Mobility Overal bed mobility: Needs Assistance Bed Mobility: Supine to Sit;Sit to Supine     Supine to sit: Min assist Sit to supine: Min assist   General bed mobility comments: Min hand held assist to boost trunk into upright sitting. Min assist for LEs back to bed. Cues for hand placement and technique. HOB flat with use of bed rails and increased time.  Transfers Overall transfer level: Needs assistance Equipment used: Rolling walker (2 wheeled) Transfers: Sit to/from Stand Sit to Stand: Min assist         General transfer comment: for steadying balance due to pain    Balance Overall balance  assessment: Needs assistance Sitting-balance support: Feet supported;Bilateral upper extremity supported Sitting balance-Leahy Scale: Fair     Standing balance support: Bilateral upper extremity supported Standing balance-Leahy Scale: Poor                             ADL either performed or assessed with clinical judgement   ADL Overall ADL's : Needs assistance/impaired Eating/Feeding: Set up;Sitting   Grooming: Minimal assistance;Sitting   Upper Body Bathing: Moderate assistance;Sitting   Lower Body Bathing: Maximal assistance;Sit to/from stand   Upper Body Dressing : Moderate assistance;Sitting   Lower Body Dressing: Maximal assistance;Sit to/from stand               Functional mobility during ADLs: Minimal assistance;Rolling walker General ADL Comments: Min assist for sit to stand from EOB with few side steps toward [facility] for repositioning. Pt reports he just finished walking with PT     Vision         Perception     Praxis      Pertinent Vitals/Pain Pain Assessment: 0-10 Pain Score: 10-Worst pain ever Pain Location: abdomen, R side Pain Descriptors / Indicators: Aching;Grimacing;Guarding Pain Intervention(s): Monitored during session;Limited activity within patient's tolerance     Hand Dominance     Extremity/Trunk Assessment Upper Extremity Assessment Upper Extremity Assessment: Overall WFL for tasks assessed   Lower Extremity Assessment Lower Extremity Assessment: Defer to PT evaluation       Communication Communication Communication: No difficulties   Cognition Arousal/Alertness: Awake/alert Behavior During Therapy: WFL for tasks assessed/performed Overall Cognitive Status:  Within Functional Limits for tasks assessed                                     General Comments       Exercises     Shoulder Instructions      Home Living Family/patient expects to be discharged to:: Private residence Living  Arrangements: Other (Comment) (brother) Available Help at Discharge: Family;Available 24 hours/day Type of Home: House Home Access: Level entry     Home Layout: One level     Bathroom Shower/Tub: Tub/shower unit;Door   Bathroom Toilet: Handicapped height     Home Equipment: None          Prior Functioning/Environment Level of Independence: Independent        Comments: Drives        OT Problem List: Decreased strength;Decreased activity tolerance;Impaired balance (sitting and/or standing);Decreased knowledge of use of DME or AE;Decreased knowledge of precautions;Obesity;Pain;Increased edema      OT Treatment/Interventions: Self-care/ADL training;Therapeutic exercise;Energy conservation;DME and/or AE instruction;Therapeutic activities;Patient/family education;Balance training    OT Goals(Current goals can be found in the care plan section) Acute Rehab OT Goals Patient Stated Goal: To get stronger OT Goal Formulation: With patient Time For Goal Achievement: 07/17/17 Potential to Achieve Goals: Good ADL Goals Pt Will Perform Grooming: (P) with supervision;standing Pt Will Perform Upper Body Bathing: (P) with supervision;sitting Pt Will Perform Lower Body Bathing: (P) with supervision;sit to/from stand Pt Will Transfer to Toilet: (P) with supervision;ambulating;bedside commode  OT Frequency: Min 2X/week   Barriers to D/C:            Co-evaluation              AM-PAC PT "6 Clicks" Daily Activity     Outcome Measure Help from another person eating meals?: None Help from another person taking care of personal grooming?: A Little Help from another person toileting, which includes using toliet, bedpan, or urinal?: A Lot Help from another person bathing (including washing, rinsing, drying)?: A Lot Help from another person to put on and taking off regular upper body clothing?: A Lot Help from another person to put on and taking off regular lower body clothing?: A  Lot 6 Click Score: 15   End of Session Equipment Utilized During Treatment: Rolling walker  Activity Tolerance: Patient tolerated treatment well;Patient limited by pain Patient left: in bed;with call bell/phone within reach  OT Visit Diagnosis: Unsteadiness on feet (R26.81);Other abnormalities of gait and mobility (R26.89);Muscle weakness (generalized) (M62.81);Pain Pain - part of body:  (abdomen)                Time: 4008-6761 OT Time Calculation (min): 14 min Charges:  OT General Charges $OT Visit: 1 Visit OT Evaluation $OT Eval Moderate Complexity: 1 Mod G-Codes:     Trajon Rosete A. Ulice Brilliant, M.S., OTR/L Pager: [place] 07/03/2017, 2:36 PM

## 2017-07-03 NOTE — Progress Notes (Signed)
Peripherally Inserted Central Catheter/Midline Placement  The IV Nurse has discussed with the patient and/or persons authorized to consent for the patient, the purpose of this procedure and the potential benefits and risks involved with this procedure.  The benefits include less needle sticks, lab draws from the catheter, and the patient may be discharged home with the catheter. Risks include, but not limited to, infection, bleeding, blood clot (thrombus formation), and puncture of an artery; nerve damage and irregular heartbeat and possibility to perform a PICC exchange if needed/ordered by physician.  Alternatives to this procedure were also discussed.  Bard Power PICC patient education guide, fact sheet on infection prevention and patient information card has been provided to patient /or left at bedside.    PICC/Midline Placement Documentation  PICC Single Lumen 07/03/17 PICC Right Cephalic 43 cm 0 cm (Active)  Indication for Insertion or Continuance of Line Home intravenous therapies (PICC only) 07/03/2017 11:11 AM  Exposed Catheter (cm) 0 cm 07/03/2017 11:11 AM  Site Assessment Clean;Dry;Intact 07/03/2017 11:11 AM  Line Status Flushed;Saline locked;Blood return noted 07/03/2017 11:11 AM  Dressing Type Transparent 07/03/2017 11:11 AM  Dressing Status Clean;Dry;Intact;Antimicrobial disc in place 07/03/2017 11:11 AM  Line Care Connections checked and tightened 07/03/2017 11:11 AM  Line Adjustment (NICU/IV Team Only) No 07/03/2017 11:11 AM  Dressing Intervention New dressing 07/03/2017 11:11 AM  Dressing Change Due 07/10/17 07/03/2017 11:11 AM       [name] 07/03/2017, 11:12 AM

## 2017-07-03 NOTE — Progress Notes (Signed)
Subjective/Chief Complaint: Pt doing well Tol PO Some bowel function   Objective: Vital signs in last 24 hours: Temp:  [97.6 F (36.4 C)-98.5 F (36.9 C)] 97.9 F (36.6 C) (10/28 0732) Pulse Rate:  [80-93] 80 (10/28 0409) Resp:  [16-27] 16 (10/28 0409) BP: (131-145)/(83-97) 142/96 (10/28 0732) SpO2:  [95 %-98 %] 96 % (10/28 0409) Last BM Date: 07/02/17  Intake/Output from previous day: 10/27 0701 - 10/28 0700 In: 966 [P.O.:966] Out: 1650 [Urine:1450; Stool:200] Intake/Output this shift: No intake/output data recorded.   Constitutional: No acute distress, conversant, appears states age. Eyes: Anicteric sclerae, moist conjunctiva, no lid lag Lungs: Clear to auscultation bilaterally, normal respiratory effort CV: regular rate and rhythm, no murmurs, no peripheral edema, pedal pulses 2+ GI: Soft, no masses or hepatosplenomegaly, R sided TTP > L side, Jp SS Skin: No rashes, palpation reveals normal turgor Psychiatric: appropriate judgment and insight, oriented to person, place, and time   Lab Results:   Recent Labs  07/02/17 0522 07/03/17 0235  WBC 28.3* 20.9*  HGB 11.4* 11.6*  HCT 35.3* 36.5*  PLT 520* 557*   BMET  Recent Labs  07/02/17 0522 07/03/17 0235  NA 135 137  K 3.5 3.9  CL 101 102  CO2 23 26  GLUCOSE 221* 192*  BUN 7 5*  CREATININE 0.71 0.74  CALCIUM 8.4* 8.5*   Anti-infectives: Anti-infectives    Start     Dose/Rate Route Frequency Ordered Stop   06/30/17 2000  cefTRIAXone (ROCEPHIN) 2 g in dextrose 5 % 50 mL IVPB     2 g 100 mL/hr over 30 Minutes Intravenous Every 24 hours 06/30/17 1304     06/30/17 2000  metroNIDAZOLE (FLAGYL) tablet 500 mg     500 mg Oral Every 8 hours 06/30/17 1304     06/30/17 0500  vancomycin (VANCOCIN) 1,500 mg in sodium chloride 0.9 % 500 mL IVPB  Status:  Discontinued     1,500 mg 250 mL/hr over 120 Minutes Intravenous Every 12 hours 06/29/17 1657 06/30/17 1304   06/29/17 1700  vancomycin (VANCOCIN) 2,500 mg  in sodium chloride 0.9 % 500 mL IVPB     2,500 mg 250 mL/hr over 120 Minutes Intravenous  Once 06/29/17 1654 06/29/17 1900   06/29/17 0430  piperacillin-tazobactam (ZOSYN) IVPB 3.375 g  Status:  Discontinued     3.375 g 12.5 mL/hr over 240 Minutes Intravenous Every 8 hours 06/29/17 0042 06/30/17 1304   06/29/17 0013  metroNIDAZOLE (FLAGYL) IVPB 500 mg  Status:  Discontinued     500 mg 100 mL/hr over 60 Minutes Intravenous Every 8 hours 06/29/17 0013 06/29/17 1146   06/28/17 2100  metroNIDAZOLE (FLAGYL) IVPB 500 mg     500 mg 100 mL/hr over 60 Minutes Intravenous  Once 06/28/17 2049 06/28/17 2214   06/28/17 2015  piperacillin-tazobactam (ZOSYN) IVPB 3.375 g     3.375 g 100 mL/hr over 30 Minutes Intravenous  Once 06/28/17 2008 06/28/17 2101      Assessment/Plan: HTN Diabetes mellitus - A1c 13.4, glucose in the 200s ?UTI - per primary team  Liver abscess Questionable Cholecystitis - CT: multiple intrahepatic lesions with inflammatory stranding, abscess adjacent to the gallbladder 8x3.5x6 cm approx., recurrent fat containing ventral hernia - S/P perc drain by IR Dr. Pascal Lux 10/25 - appreciate his help - WBC trending down - no indications for emergent surgical intervention - Continue IV abx   FEN: NPO  VTE: SCDs, lovenox ID: Zosyn (10/23>>), Flagyl (10/23>>)  Plan:Continue IV abx. No  surgical intervention planned   LOS: 5 days    Rosario Jacks., [facility] 07/03/2017

## 2017-07-04 DIAGNOSIS — I10 Essential (primary) hypertension: Secondary | ICD-10-CM

## 2017-07-04 DIAGNOSIS — E119 Type 2 diabetes mellitus without complications: Secondary | ICD-10-CM

## 2017-07-04 DIAGNOSIS — G8918 Other acute postprocedural pain: Secondary | ICD-10-CM

## 2017-07-04 DIAGNOSIS — D62 Acute posthemorrhagic anemia: Secondary | ICD-10-CM

## 2017-07-04 DIAGNOSIS — D72829 Elevated white blood cell count, unspecified: Secondary | ICD-10-CM

## 2017-07-04 DIAGNOSIS — I5189 Other ill-defined heart diseases: Secondary | ICD-10-CM

## 2017-07-04 DIAGNOSIS — I519 Heart disease, unspecified: Secondary | ICD-10-CM

## 2017-07-04 DIAGNOSIS — R7881 Bacteremia: Secondary | ICD-10-CM

## 2017-07-04 DIAGNOSIS — Z8739 Personal history of other diseases of the musculoskeletal system and connective tissue: Secondary | ICD-10-CM

## 2017-07-04 LAB — CULTURE, BLOOD (ROUTINE X 2)
CULTURE: NO GROWTH
Culture: NO GROWTH
Special Requests: ADEQUATE
Special Requests: ADEQUATE

## 2017-07-04 LAB — CBC
HCT: 36 % — ABNORMAL LOW (ref 39.0–52.0)
Hemoglobin: 11.2 g/dL — ABNORMAL LOW (ref 13.0–17.0)
MCH: 25.5 pg — AB (ref 26.0–34.0)
MCHC: 31.1 g/dL (ref 30.0–36.0)
MCV: 82 fL (ref 78.0–100.0)
PLATELETS: 602 10*3/uL — AB (ref 150–400)
RBC: 4.39 MIL/uL (ref 4.22–5.81)
RDW: 15.4 % (ref 11.5–15.5)
WBC: 22.5 10*3/uL — ABNORMAL HIGH (ref 4.0–10.5)

## 2017-07-04 LAB — GLUCOSE, CAPILLARY
GLUCOSE-CAPILLARY: 142 mg/dL — AB (ref 65–99)
GLUCOSE-CAPILLARY: 162 mg/dL — AB (ref 65–99)
GLUCOSE-CAPILLARY: 186 mg/dL — AB (ref 65–99)
Glucose-Capillary: 229 mg/dL — ABNORMAL HIGH (ref 65–99)
Glucose-Capillary: 175 mg/dL — ABNORMAL HIGH (ref 65–99)
Glucose-Capillary: 181 mg/dL — ABNORMAL HIGH (ref 65–99)

## 2017-07-04 MED ORDER — FLUCONAZOLE 100 MG PO TABS
400.0000 mg | ORAL_TABLET | Freq: Every day | ORAL | Status: DC
  Administered 2017-07-05 – 2017-07-13 (×9): 400 mg via ORAL
  Filled 2017-07-04: qty 4
  Filled 2017-07-04 (×2): qty 2
  Filled 2017-07-04 (×3): qty 4
  Filled 2017-07-04: qty 2
  Filled 2017-07-04: qty 4
  Filled 2017-07-04: qty 2

## 2017-07-04 MED ORDER — MAGNESIUM SULFATE 2 GM/50ML IV SOLN
2.0000 g | Freq: Once | INTRAVENOUS | Status: AC
  Administered 2017-07-04: 2 g via INTRAVENOUS
  Filled 2017-07-04: qty 50

## 2017-07-04 NOTE — Progress Notes (Signed)
[facility] for Infectious Disease    Date of Admission:  06/28/2017   Total days of antibiotics 7        Day 5 ceftriaxone/metro, day 2 fluc   ID: [name] is a 51 y.o. male with  Hepatic abscess and secondary strep bacteremia Active Problems:   Hepatic abscess   Abscess, hepatic   Acute cholecystitis   Type 2 diabetes mellitus without complication, without long-term current use of insulin (HCC)   Bacteremia due to Gram-positive bacteria   Diarrhea   Diastolic dysfunction   Bacteremia   History of gout   Benign essential HTN   Post-operative pain   Acute blood loss anemia   Leukocytosis    Subjective: Still some soreness near his drain sites at RUQ, no longer having fevers  Medications:  . Chlorhexidine Gluconate Cloth  6 each Topical Daily  . enoxaparin (LOVENOX) injection  40 mg Subcutaneous Daily  . [START ON 07/05/2017] fluconazole  400 mg Oral Daily  . insulin aspart  0-9 Units Subcutaneous TID WC  . insulin glargine  20 Units Subcutaneous QHS  . lactobacillus acidophilus  2 tablet Oral TID  . metroNIDAZOLE  500 mg Oral Q8H  . pantoprazole  40 mg Oral Daily  . sodium chloride flush  10-40 mL Intracatheter Q12H  . sodium chloride flush  5 mL Intravenous Q8H    Objective: Vital signs in last 24 hours: Temp:  [98 F (36.7 C)-98.4 F (36.9 C)] 98.4 F (36.9 C) (10/29 1551) Pulse Rate:  [81-88] 88 (10/29 1551) Resp:  [18-21] 19 (10/29 1551) BP: (142-150)/(90-101) 142/93 (10/29 1551) SpO2:  [94 %-97 %] 94 % (10/29 1551) Physical Exam  Constitutional: He is oriented to person, place, and time. He appears well-developed and well-nourished. No distress.  HENT:  Mouth/Throat: Oropharynx is clear and moist. No oropharyngeal exudate.  Cardiovascular: Normal rate, regular rhythm and normal heart sounds. Exam reveals no gallop and no friction rub.  No murmur heard.  Pulmonary/Chest: Effort normal and breath sounds normal. No respiratory distress. He has no  wheezes.  Abdominal: Soft. Bowel sounds are normal. He exhibits no distension. Tenderness inferior to drains x 2 Neurological: He is alert and oriented to person, place, and time.  Skin: Skin is warm and dry. No rash noted. No erythema.  Psychiatric: He has a normal mood and affect. His behavior is normal.     Lab Results  Recent Labs  07/02/17 0522 07/03/17 0235 07/04/17 0505  WBC 28.3* 20.9* 22.5*  HGB 11.4* 11.6* 11.2*  HCT 35.3* 36.5* 36.0*  NA 135 137  --   K 3.5 3.9  --   CL 101 102  --   CO2 23 26  --   BUN 7 5*  --   CREATININE 0.71 0.74  --    Liver Panel  Recent Labs  07/02/17 0522 07/03/17 0235  PROT 7.8  --   ALBUMIN 1.6*  1.6* 1.6*  AST 25  --   ALT 27  --   ALKPHOS 337*  --   BILITOT 0.9  --   BILIDIR 0.4  --   IBILI 0.5  --     Microbiology: 10/25 liver abscess = strep +,candida Studies/Results: No results found.   Assessment/Plan: Liver abscess = continue on ceftriaxone 2gm iv daily plus oral flagyl and oral fluconazole. We will increase fluconazole dose to 400mg  daily  Leukocytosis = would expect that this should be trending downward instead of being flat. Agree  with plan to repeat CT imaging to assess liver abscess size since drainage or to see if any there is any obstruction to biliary system. If no change, may consider changing abtx.  abd pain = defer to primary team for management  [facility] Cell: [phone] Pager: [phone]  07/04/2017, 5:33 PM

## 2017-07-04 NOTE — Progress Notes (Signed)
   Subjective/Chief Complaint: Reports only mild RUQ discomfort Tolerating po No fevers   Objective: Vital signs in last 24 hours: Temp:  [97.9 F (36.6 C)-98.2 F (36.8 C)] 98 F (36.7 C) (10/29 0748) Pulse Rate:  [80-88] 81 (10/29 0748) Resp:  [18-24] 21 (10/29 0748) BP: (139-149)/(78-101) 149/95 (10/29 0748) SpO2:  [95 %-98 %] 95 % (10/29 0748) Last BM Date: 07/03/17  Intake/Output from previous day: 10/28 0701 - 10/29 0700 In: 2610 [P.O.:2510; IV Piggyback:100] Out: 2050 [Urine:2050] Intake/Output this shift: No intake/output data recorded.  Exam: Abdomen soft, obese, drain looks like old blood  Lab Results:   Recent Labs  07/03/17 0235 07/04/17 0505  WBC 20.9* 22.5*  HGB 11.6* 11.2*  HCT 36.5* 36.0*  PLT 557* 602*   BMET  Recent Labs  07/02/17 0522 07/03/17 0235  NA 135 137  K 3.5 3.9  CL 101 102  CO2 23 26  GLUCOSE 221* 192*  BUN 7 5*  CREATININE 0.71 0.74  CALCIUM 8.4* 8.5*   PT/INR No results for input(s): LABPROT, INR in the last 72 hours. ABG No results for input(s): PHART, HCO3 in the last 72 hours.  Invalid input(s): PCO2, PO2  Studies/Results: No results found.  Anti-infectives: Anti-infectives    Start     Dose/Rate Route Frequency Ordered Stop   07/03/17 1530  fluconazole (DIFLUCAN) tablet 200 mg     200 mg Oral Daily 07/03/17 1506     06/30/17 2000  cefTRIAXone (ROCEPHIN) 2 g in dextrose 5 % 50 mL IVPB     2 g 100 mL/hr over 30 Minutes Intravenous Every 24 hours 06/30/17 1304     06/30/17 2000  metroNIDAZOLE (FLAGYL) tablet 500 mg     500 mg Oral Every 8 hours 06/30/17 1304     06/30/17 0500  vancomycin (VANCOCIN) 1,500 mg in sodium chloride 0.9 % 500 mL IVPB  Status:  Discontinued     1,500 mg 250 mL/hr over 120 Minutes Intravenous Every 12 hours 06/29/17 1657 06/30/17 1304   06/29/17 1700  vancomycin (VANCOCIN) 2,500 mg in sodium chloride 0.9 % 500 mL IVPB     2,500 mg 250 mL/hr over 120 Minutes Intravenous  Once  06/29/17 1654 06/29/17 1900   06/29/17 0430  piperacillin-tazobactam (ZOSYN) IVPB 3.375 g  Status:  Discontinued     3.375 g 12.5 mL/hr over 240 Minutes Intravenous Every 8 hours 06/29/17 0042 06/30/17 1304   06/29/17 0013  metroNIDAZOLE (FLAGYL) IVPB 500 mg  Status:  Discontinued     500 mg 100 mL/hr over 60 Minutes Intravenous Every 8 hours 06/29/17 0013 06/29/17 1146   06/28/17 2100  metroNIDAZOLE (FLAGYL) IVPB 500 mg     500 mg 100 mL/hr over 60 Minutes Intravenous  Once 06/28/17 2049 06/28/17 2214   06/28/17 2015  piperacillin-tazobactam (ZOSYN) IVPB 3.375 g     3.375 g 100 mL/hr over 30 Minutes Intravenous  Once 06/28/17 2008 06/28/17 2101      Assessment/Plan: Liver abscess  WBC up today.  If trend continues, will need to repeat his CT scan tomorrow. If gallbladder remains a potential issue, may try to get a HIDA scan.   LOS: 6 days    [name] A 07/04/2017

## 2017-07-04 NOTE — Progress Notes (Signed)
PROGRESS NOTE    [name]  ASN:[id number] DOB: 1966-01-15 DOA: 06/28/2017 PCP: [name], PA-C  Outpatient Specialists:     Brief Narrative:  Patient is 51 y.o. male w/ PMHx significant for T2DM, HTN, arthritis presented to [facility] ED with abdominal pain, fatigue, and weight loss.  Labs were significant for leukocytosis, mild anemia, abnormal LFTs, and hyperglycemia.  CXR show mild pulmonary venous congestion and cardiomegaly.  CT abdomen/pelvis showed multiple intrahepatic lesions with perihepatic inflammatory stranding and fluid collection around the gallbladder with thickening of the gallbladder wall.  General surgery was consulted who recommended patient be transferred to [facility].  Started flagyl and zosyn on 10/23 per pharmacy consult, flagyl discontinued 10/24.  General surgery evaluated patient 10/24 - felt emergent cholecystectomy was not indicated at that time, recommended following clinically.  IR was consulted - two percutaneous drains placed 10/25.  Hep panel negative.  C diff negative.  Right cephalic PICC placed 19/37.  TTE (10/26) - LV EF 60-65%, no emboli.   Assessment & Plan:   Active Problems:   Hepatic abscess   Abscess, hepatic   Acute cholecystitis   Type 2 diabetes mellitus without complication, without long-term current use of insulin (HCC)   Bacteremia due to Gram-positive bacteria   Diarrhea   1. Intrahepatic Abscesses -CT abdomen/pelvis showed multiple intrahepatic lesions with perihepatic inflammatory stranding -Blood culture (10/23) grows gram positive cocci, sensitivity report pending.   -Percutaneous drains (2) placed on 10/25 by IR -Hepatic panel (10/23) negative -WBC increased today (22.5).  Per surgery, repeat CT tomorrow if WBC continues to increase. -Continue IV abx -Continue to monitor LFTs  2. Group F streptococcal bacteremia -Blood culture (10/23) growing streptococcus group F.   - TTE (10/26) - LV EF 60-65%, no emboli -C diff  negative.  -Continue IV abx, monitor  3. Type 2 Diabetes Mellitus -Hemoglobin A1c 13.4 on 10/23.  CBG 175 this morning.  -Continue sliding scale Novolog and carb modified diet -Continue CBG monitoring  3. Normocytic anemia of unknown etiology -Iron profile (10/25) - low iron and TIBC, increased ferritin.  Iron deficient - question of nutritional vs. infectious status.  Avoiding supplemental iron in setting of bacteremia. -Hemoglobin 11.2 this morning -Continue to monitor  4. Acute cholecystitis on [place] -Per general surgery today, if gallbladder remains potential issue in setting of intrahepatic abscesses s/p perc drain placement, and leukocytosis, HIDA scan may be ordered.    DVT prophylaxis: Lovenox Code Status: Full Family Communication: Spoke with patient Disposition Plan: SNF    Consultants:   General surgery  Interventional radiology  Procedures:    CT guided percutaneous catheter placement (10/25)  PICC, right cephalic (90/24)  Antimicrobials:   IV Flagyl (10/23 - 10/24)   IV Zosyn (10/23 - 10/25)  IV Vancomycin (10/24)  PO Metronidazole (10/25 - )  IV Ceftriaxone (10/25 - )   Subjective: Patient is sore at drain sites.  Continues to tolerating food/liquids well.  Denies nausea/vomiting, chest pain, SOB.  Agrees that discharge to SNF is in best interest.  Objective: Vitals:   07/03/17 2100 07/03/17 2300 07/04/17 0300 07/04/17 0748  BP: (!) 143/96 (!) 149/93 (!) 143/101 (!) 149/95  Pulse: 84 88 82 81  Resp: 19 (!) 21 18 (!) 21  Temp: 98.2 F (36.8 C) 98 F (36.7 C) 98 F (36.7 C) 98 F (36.7 C)  TempSrc: Oral Oral Oral Oral  SpO2: 96% 96% 97% 95%  Weight:      Height:  Intake/Output Summary (Last 24 hours) at 07/04/17 1038 Last data filed at 07/04/17 0631  Gross per 24 hour  Intake             2010 ml  Output             1775 ml  Net              235 ml   Filed Weights   06/28/17 1535 06/29/17 0403  Weight: (!) 170.1 kg (375 lb)  (!) 170.5 kg (375 lb 12.8 oz)    Examination:  General exam: Laying in hospital bed in no acute distress  Respiratory system: Diminished BS clear to auscultation bilaterally.  Non-labored respirations.  Cardiovascular system: S1/S2 heard, RRR.  No JVD, murmurs, gallops, rubs.  No pedal edema Gastrointestinal system: Obese abdomen, TTP in RUQ near drain sites.  Drain exit sites are dressed, clean/dry/intact.  + bowel sounds. Central nervous system: Alert and oriented. No focal neurological deficits. Extremities: Symmetric 5 x 5 power. Skin: No rashes, lesions or ulcers Psychiatry: Judgement and insight appear normal. Mood & affect appropriate.     Data Reviewed: I have personally reviewed following labs and imaging studies  CBC:  Recent Labs Lab 06/28/17 1232  06/30/17 0709 07/01/17 0318 07/02/17 0522 07/03/17 0235 07/04/17 0505  WBC 21.9*  < > 18.8* 33.1* 28.3* 20.9* 22.5*  NEUTROABS 16.7*  --   --   --   --   --   --   HGB 11.9*  < > 11.8* 10.7* 11.4* 11.6* 11.2*  HCT 36.8*  < > 36.2* 34.0* 35.3* 36.5* 36.0*  MCV 82.5  < > 82.3 81.7 81.1 82.2 82.0  PLT 491*  < > 584* 517* 520* 557* 602*  < > = values in this interval not displayed. Basic Metabolic Panel:  Recent Labs Lab 06/28/17 1232 06/29/17 0730 06/30/17 0709 07/01/17 0318 07/02/17 0522 07/03/17 0235  NA 138  --  133* 135 135 137  K 4.2  --  3.7 3.7 3.5 3.9  CL 101  --  96* 101 101 102  CO2 26  --  27 23 23 26   GLUCOSE 239*  --  219* 232* 221* 192*  BUN 7  --  <5* 8 7 5*  CREATININE 0.71 0.69 0.80 0.86 0.71 0.74  CALCIUM 8.9  --  8.2* 8.1* 8.4* 8.5*  MG  --   --   --  1.9  --   --   PHOS  --   --   --   --  3.5 4.4   GFR: Estimated Creatinine Clearance: 164.8 mL/min (by C-G formula based on SCr of 0.74 mg/dL). Liver Function Tests:  Recent Labs Lab 06/28/17 1232 06/30/17 0709 07/02/17 0522 07/03/17 0235  AST 115* 38 25  --   ALT 73* 40 27  --   ALKPHOS 474* 406* 337*  --   BILITOT 3.3* 1.7* 0.9   --   PROT 7.9 7.5 7.8  --   ALBUMIN 2.2* 1.7* 1.6*  1.6* 1.6*    Recent Labs Lab 06/28/17 1849  LIPASE 20   No results for input(s): AMMONIA in the last 168 hours. Coagulation Profile:  Recent Labs Lab 06/30/17 0709  INR 1.24   Cardiac Enzymes: No results for input(s): CKTOTAL, CKMB, CKMBINDEX, TROPONINI in the last 168 hours. BNP (last 3 results) No results for input(s): PROBNP in the last 8760 hours. HbA1C: No results for input(s): HGBA1C in the last 72 hours. CBG:  Recent  Labs Lab 07/03/17 0813 07/03/17 1211 07/03/17 1553 07/03/17 2127 07/04/17 0750  GLUCAP 175* 235* 165* 163* 175*   Lipid Profile: No results for input(s): CHOL, HDL, LDLCALC, TRIG, CHOLHDL, LDLDIRECT in the last 72 hours. Thyroid Function Tests: No results for input(s): TSH, T4TOTAL, FREET4, T3FREE, THYROIDAB in the last 72 hours. Anemia Panel: No results for input(s): VITAMINB12, FOLATE, FERRITIN, TIBC, IRON, RETICCTPCT in the last 72 hours. Urine analysis:    Component Value Date/Time   COLORURINE AMBER (A) 06/28/2017 1828   APPEARANCEUR CLEAR 06/28/2017 1828   LABSPEC 1.040 (H) 06/28/2017 1828   PHURINE 6.0 06/28/2017 1828   GLUCOSEU 50 (A) 06/28/2017 1828   HGBUR NEGATIVE 06/28/2017 1828   BILIRUBINUR SMALL (A) 06/28/2017 1828   BILIRUBINUR LARGE 06/28/2017 1155   Percival 06/28/2017 1828   PROTEINUR 30 (A) 06/28/2017 1828   UROBILINOGEN 2.0 (A) 06/28/2017 1155   NITRITE NEGATIVE 06/28/2017 1828   LEUKOCYTESUR NEGATIVE 06/28/2017 1828   Sepsis Labs: @LABRCNTIP (procalcitonin:4,lacticidven:4)  ) Recent Results (from the past 240 hour(s))  Urine culture     Status: None   Collection Time: 06/28/17  6:28 PM  Result Value Ref Range Status   Specimen Description URINE, RANDOM  Final   Special Requests NONE  Final   Culture   Final    NO GROWTH Performed at [facility], [place] [address], [place], [place] [address]    Report Status 06/30/2017 FINAL  Final  Blood  culture (routine x 2)     Status: Abnormal   Collection Time: 06/28/17  6:49 PM  Result Value Ref Range Status   Specimen Description RIGHT ANTECUBITAL  Final   Special Requests   Final    BOTTLES DRAWN AEROBIC AND ANAEROBIC Blood Culture adequate volume   Culture  Setup Time   Final    GRAM POSITIVE COCCI Gram Stain Report Called to,Read Back By and Verified With: ANGELICA DAVIS. @ 9417 ON [id number] BY HENDERSON L. ANAEROBIC AEROBIC Gram Stain Report Called to,Read Back By and Verified With: MIZE,J @ 1930 ON 10.24.18 BY BOWMAN,L CRITICAL RESULT CALLED TO, READ BACK BY AND VERIFIED WITH: J.LEDFORD PHARMD 06/30/17 0207 L.CHAMPION Performed at [facility], [place] [address] [place], [place] [address]    Culture STREPTOCOCCUS GROUP F (A)  Final   Report Status 07/03/2017 FINAL  Final   Organism ID, Bacteria STREPTOCOCCUS GROUP F  Final      Susceptibility   Streptococcus group f - MIC*    CLINDAMYCIN RESISTANT Resistant     AMPICILLIN <=0.25 SENSITIVE Sensitive     VANCOMYCIN 0.5 SENSITIVE Sensitive     CEFTRIAXONE 1      LEVOFLOXACIN <=0.25 SENSITIVE Sensitive     * STREPTOCOCCUS GROUP F  Blood culture (routine x 2)     Status: Abnormal   Collection Time: 06/28/17  6:49 PM  Result Value Ref Range Status   Specimen Description BLOOD RIGHT HAND  Final   Special Requests   Final    BOTTLES DRAWN AEROBIC AND ANAEROBIC Blood Culture adequate volume   Culture  Setup Time   Final    GRAM POSITIVE COCCI Gram Stain Report Called to,Read Back By and Verified With: ANGELICA DAVIS @ 4970 ON [id number] BY HENDERSON L. ANAEROBIC AEROBIC Gram Stain Report Called to,Read Back By and Verified With: MIZE, J @ 1930 ON 10.24.18 BY BOWMAN,L    Culture (A)  Final    STREPTOCOCCUS GROUP F SUSCEPTIBILITIES PERFORMED ON PREVIOUS CULTURE WITHIN THE LAST 5  DAYS. Performed at [facility], [place] [address], [place], [place] [address]    Report Status 07/03/2017 FINAL  Final  Blood Culture ID Panel  (Reflexed)     Status: Abnormal   Collection Time: 06/28/17  6:49 PM  Result Value Ref Range Status   Enterococcus species NOT DETECTED NOT DETECTED Final   Listeria monocytogenes NOT DETECTED NOT DETECTED Final   Staphylococcus species NOT DETECTED NOT DETECTED Final   Staphylococcus aureus NOT DETECTED NOT DETECTED Final   Streptococcus species DETECTED (A) NOT DETECTED Final    Comment: Not Enterococcus species, Streptococcus agalactiae, Streptococcus pyogenes, or Streptococcus pneumoniae. CRITICAL RESULT CALLED TO, READ BACK BY AND VERIFIED WITH: J.LEDFORD PHARMD 06/30/17 0207 L.CHAMPION    Streptococcus agalactiae NOT DETECTED NOT DETECTED Final   Streptococcus pneumoniae NOT DETECTED NOT DETECTED Final   Streptococcus pyogenes NOT DETECTED NOT DETECTED Final   Acinetobacter baumannii NOT DETECTED NOT DETECTED Final   Enterobacteriaceae species NOT DETECTED NOT DETECTED Final   Enterobacter cloacae complex NOT DETECTED NOT DETECTED Final   Escherichia coli NOT DETECTED NOT DETECTED Final   Klebsiella oxytoca NOT DETECTED NOT DETECTED Final   Klebsiella pneumoniae NOT DETECTED NOT DETECTED Final   Proteus species NOT DETECTED NOT DETECTED Final   Serratia marcescens NOT DETECTED NOT DETECTED Final   Haemophilus influenzae NOT DETECTED NOT DETECTED Final   Neisseria meningitidis NOT DETECTED NOT DETECTED Final   Pseudomonas aeruginosa NOT DETECTED NOT DETECTED Final   Candida albicans NOT DETECTED NOT DETECTED Final   Candida glabrata NOT DETECTED NOT DETECTED Final   Candida krusei NOT DETECTED NOT DETECTED Final   Candida parapsilosis NOT DETECTED NOT DETECTED Final   Candida tropicalis NOT DETECTED NOT DETECTED Final    Comment: Performed at [facility], [place] [address], [place], [place] [address]  MRSA PCR Screening     Status: None   Collection Time: 06/29/17  4:12 AM  Result Value Ref Range Status   MRSA by PCR NEGATIVE NEGATIVE Final    Comment:        The  GeneXpert MRSA Assay (FDA approved for NASAL specimens only), is one component of a comprehensive MRSA colonization surveillance program. It is not intended to diagnose MRSA infection nor to guide or monitor treatment for MRSA infections.   Culture, blood (routine x 2)     Status: None (Preliminary result)   Collection Time: 06/29/17  6:58 PM  Result Value Ref Range Status   Specimen Description BLOOD RIGHT HAND  Final   Special Requests   Final    BOTTLES DRAWN AEROBIC ONLY Blood Culture adequate volume   Culture NO GROWTH 4 DAYS  Final   Report Status PENDING  Incomplete  Culture, blood (routine x 2)     Status: None (Preliminary result)   Collection Time: 06/29/17  7:09 PM  Result Value Ref Range Status   Specimen Description BLOOD RIGHT ANTECUBITAL  Final   Special Requests   Final    BOTTLES DRAWN AEROBIC AND ANAEROBIC Blood Culture adequate volume   Culture NO GROWTH 4 DAYS  Final   Report Status PENDING  Incomplete  Aerobic/Anaerobic Culture (surgical/deep wound)     Status: None (Preliminary result)   Collection Time: 06/30/17  1:31 PM  Result Value Ref Range Status   Specimen Description ABSCESS  Final   Special Requests HEPATIC ABSCESS DRAINAGE  Final   Gram Stain   Final    ABUNDANT WBC PRESENT, PREDOMINANTLY PMN ABUNDANT GRAM POSITIVE  COCCI ABUNDANT GRAM POSITIVE RODS    Culture   Final    FEW CANDIDA ALBICANS MODERATE STREPTOCOCCUS GROUP C MODERATE STREPTOCOCCUS GROUP F NO ANAEROBES ISOLATED; CULTURE IN PROGRESS FOR 5 DAYS    Report Status PENDING  Incomplete  C difficile quick scan w PCR reflex     Status: None   Collection Time: 07/01/17  8:00 PM  Result Value Ref Range Status   C Diff antigen NEGATIVE NEGATIVE Final   C Diff toxin NEGATIVE NEGATIVE Final   C Diff interpretation No C. difficile detected.  Final         Radiology Studies: No results found.      Scheduled Meds: . Chlorhexidine Gluconate Cloth  6 each Topical Daily  .  enoxaparin (LOVENOX) injection  40 mg Subcutaneous Daily  . fluconazole  200 mg Oral Daily  .  HYDROmorphone (DILAUDID) injection  1 mg Intravenous Once  . insulin aspart  0-9 Units Subcutaneous TID WC  . insulin glargine  20 Units Subcutaneous QHS  . lactobacillus acidophilus  2 tablet Oral TID  . metroNIDAZOLE  500 mg Oral Q8H  . pantoprazole  40 mg Oral Daily  . sodium chloride flush  10-40 mL Intracatheter Q12H  . sodium chloride flush  5 mL Intravenous Q8H   Continuous Infusions: . sodium chloride Stopped (07/02/17 0343)  . cefTRIAXone (ROCEPHIN)  IV Stopped (07/03/17 2151)     LOS: 6 days     Tanzania Hall-Potvin, PA-S [facility] Pager [phone]  If 7PM-7AM, please contact night-coverage [email or web address] Password [email or web address] 07/04/2017, 10:38 AM

## 2017-07-04 NOTE — Progress Notes (Signed)
Patient ID: [name], male   DOB: 09-15-1965, 51 y.o.   MRN: [id number]    Referring Physician(s): Dr. Donnie Mesa  Supervising Physician: Corrie Mckusick  Patient Status: [facility] - In-pt  Chief Complaint: Hepatic abscesses  Subjective: Patient states his pain is overall better.  Patient states his drains are not being flushed  Allergies: Patient has no known allergies.  Medications: Prior to Admission medications   Medication Sig Start Date End Date Taking? Authorizing Provider  oxyCODONE-acetaminophen (PERCOCET) 10-325 MG tablet Take 1 tablet by mouth every 4 (four) hours as needed for pain. 06/15/17  Yes Sanjuana Kava, MD  albuterol (PROVENTIL HFA;VENTOLIN HFA) 108 (90 Base) MCG/ACT inhaler Inhale 2 puffs into the lungs every 6 (six) hours as needed for wheezing or shortness of breath. 06/28/17   [name], PA-C  benzonatate (TESSALON PERLES) 100 MG capsule 1-2 po q 8 hour prn cough Patient taking differently: Take 100-200 mg by mouth 3 (three) times daily as needed for cough. 1-2 po q 8 hour prn cough 06/28/17   [name], PA-C  cephALEXin (KEFLEX) 500 MG capsule Take 1 capsule (500 mg total) by mouth 4 (four) times daily. 06/28/17   [name], PA-C  metFORMIN (GLUCOPHAGE) 1000 MG tablet Take 1 tablet (1,000 mg total) by mouth 2 (two) times daily with a meal. 06/28/17   [name], PA-C    Vital Signs: BP (!) 149/95 (BP Location: Left Wrist)   Pulse 81   Temp 98 F (36.7 C) (Oral)   Resp (!) 21   Ht 5\' 7"  (1.702 m)   Wt (!) 375 lb 12.8 oz (170.5 kg)   SpO2 95%   BMI 58.86 kg/m   Physical Exam: Abd: both drain sites are c/d/i.  Both drains with bloody/purulent output.  Inferior drain currently with about 10cc present.  Superior drain with about 5 cc present.  Imaging: Ct Image Guided Drainage By Percutaneous Catheter  Result Date: 06/30/2017 INDICATION: History of hepatic abscesses. Please perform ultrasound/CT-guided hepatic abscess  drainage catheter placement. EXAM: ULTRASOUND AND CT-GUIDED HEPATIC ABSCESS DRAINAGE CATHETER PLACEMENT x2 COMPARISON:  CT abdomen pelvis - 06/28/2017 MEDICATIONS: The patient is currently admitted to the hospital and receiving intravenous antibiotics. The antibiotics were administered within an appropriate time frame prior to the initiation of the procedure. ANESTHESIA/SEDATION: Moderate (conscious) sedation was employed during this procedure. A total of Versed 7 mg and Fentanyl 400 mcg, Dilaudid 2 mg IV and Demerol 50 mg IV was administered intravenously. Moderate Sedation Time: 25 minutes. The patient's level of consciousness and vital signs were monitored continuously by radiology nursing throughout the procedure under my direct supervision. CONTRAST:  None COMPLICATIONS: SIR LEVEL B - Normal therapy, includes overnight admission for observation. The patient developed rigors following placement of both hepatic abscess drainage catheters which was treated with the administration of 50 mg of Demerol IV. PROCEDURE: Informed written consent was obtained from the patient after a discussion of the risks, benefits and alternatives to treatment. The patient was placed supine on the CT gantry and a pre procedural CT was performed re-demonstrating the known hepatic abscesses with dominant ill-defined component within the superolateral aspect the right lobe of the liver measuring approximately 5.3 x 4.9 cm (image 40, series 2), additional ill-defined component with the subcapsular inferior aspect of the right lobe of the liver measuring approximately 5.2 x 4.4 cm (image 63, series 2) an ill-defined slightly hyperattenuating fluid collection within the porta hepatis measuring approximately 8.5 x 4.4 cm (image 66, series  2). All 3 of these fluid collections were identified sonographically. The right superolateral abdomen as well as the midline of the upper abdomen was prepped and draped in usual sterile fashion. The  overlying soft tissues were anesthetized with 1% lidocaine with epinephrine. Under direct ultrasound guidance, each hypoechoic collection were sequentially accessed with an 18 gauge trocar needle. Both hepatic samples yielded the aspiration of a small amount of purulent fluid, while the porta hepatis collection yielded a small amount of bilious appearing fluid. Short Amplatz wires were placed at all locations. CT scanning demonstrated appropriate positioning of the Amplatz wires within both hepatic abscesses, however the wire was not appropriately coiled within the porta hepatis collection. Next, both hepatic abscess drainage catheters were placed after the tracks were serially dilated allowing placement of a 10 [place] all-purpose drainage catheters. As patient was complaining of discomfort lying on the table and then developed rigors, additional intervention was not performed at this time to attempt placement of a drainage catheter into the perihepatic abscess. Limited postprocedural scanning was performed demonstrating appropriate positioning of both hepatic abscess drainage catheters. Approximately 15 cc of purulent fluid was aspirated from the superolateral abscess while approximately 35 cc was aspirated from the abscess within the anterior aspect the right lobe of the liver. Both drainage catheters were secured at the skin entrance site within interrupted suture and connected to JP bulbs. Dressings were placed. IMPRESSION: 1. Successful ultrasound and CT-guided drainage catheter placement into dominant abscess within the superolateral aspect the right lobe of the liver yielding 15 cc of purulent material. 2. Successful ultrasound and CT-guided drainage catheter placement into dominant abscess within the anterior inferior aspect the right lobe of the liver yielding 35 cc of purulent material. 3. Attempted though ultimately unsuccessful placement of a percutaneous drainage catheter into the ill-defined suspected  abscess within the porta hepatis abscess, secondary to patient's inability to tolerate the procedure as well as development of rigors following placement of the 2 additional hepatic abscess drainage catheters. 4. A representative sample of aspirated purulent material was sent to the laboratory for analysis. PLAN: - As the suspected abscess within the dome of the right lobe of the liver and potential abscess within the porta hepatis remain undrained, would have a low threshold to repeat an IV only abdominal CT if patient has infectious symptoms do not defervesce within the next several days, however note, both collections may be NOT amenable to image guided drainage secondary to location and patient body habitus. Electronically Signed   By: Sandi Mariscal M.D.   On: 06/30/2017 14:55   Ct Image Guided Drainage By Percutaneous Catheter  Result Date: 06/30/2017 INDICATION: History of hepatic abscesses. Please perform ultrasound/CT-guided hepatic abscess drainage catheter placement. EXAM: ULTRASOUND AND CT-GUIDED HEPATIC ABSCESS DRAINAGE CATHETER PLACEMENT x2 COMPARISON:  CT abdomen pelvis - 06/28/2017 MEDICATIONS: The patient is currently admitted to the hospital and receiving intravenous antibiotics. The antibiotics were administered within an appropriate time frame prior to the initiation of the procedure. ANESTHESIA/SEDATION: Moderate (conscious) sedation was employed during this procedure. A total of Versed 7 mg and Fentanyl 400 mcg, Dilaudid 2 mg IV and Demerol 50 mg IV was administered intravenously. Moderate Sedation Time: 25 minutes. The patient's level of consciousness and vital signs were monitored continuously by radiology nursing throughout the procedure under my direct supervision. CONTRAST:  None COMPLICATIONS: SIR LEVEL B - Normal therapy, includes overnight admission for observation. The patient developed rigors following placement of both hepatic abscess drainage catheters which was treated with  the  administration of 50 mg of Demerol IV. PROCEDURE: Informed written consent was obtained from the patient after a discussion of the risks, benefits and alternatives to treatment. The patient was placed supine on the CT gantry and a pre procedural CT was performed re-demonstrating the known hepatic abscesses with dominant ill-defined component within the superolateral aspect the right lobe of the liver measuring approximately 5.3 x 4.9 cm (image 40, series 2), additional ill-defined component with the subcapsular inferior aspect of the right lobe of the liver measuring approximately 5.2 x 4.4 cm (image 63, series 2) an ill-defined slightly hyperattenuating fluid collection within the porta hepatis measuring approximately 8.5 x 4.4 cm (image 66, series 2). All 3 of these fluid collections were identified sonographically. The right superolateral abdomen as well as the midline of the upper abdomen was prepped and draped in usual sterile fashion. The overlying soft tissues were anesthetized with 1% lidocaine with epinephrine. Under direct ultrasound guidance, each hypoechoic collection were sequentially accessed with an 18 gauge trocar needle. Both hepatic samples yielded the aspiration of a small amount of purulent fluid, while the porta hepatis collection yielded a small amount of bilious appearing fluid. Short Amplatz wires were placed at all locations. CT scanning demonstrated appropriate positioning of the Amplatz wires within both hepatic abscesses, however the wire was not appropriately coiled within the porta hepatis collection. Next, both hepatic abscess drainage catheters were placed after the tracks were serially dilated allowing placement of a 10 [place] all-purpose drainage catheters. As patient was complaining of discomfort lying on the table and then developed rigors, additional intervention was not performed at this time to attempt placement of a drainage catheter into the perihepatic abscess. Limited  postprocedural scanning was performed demonstrating appropriate positioning of both hepatic abscess drainage catheters. Approximately 15 cc of purulent fluid was aspirated from the superolateral abscess while approximately 35 cc was aspirated from the abscess within the anterior aspect the right lobe of the liver. Both drainage catheters were secured at the skin entrance site within interrupted suture and connected to JP bulbs. Dressings were placed. IMPRESSION: 1. Successful ultrasound and CT-guided drainage catheter placement into dominant abscess within the superolateral aspect the right lobe of the liver yielding 15 cc of purulent material. 2. Successful ultrasound and CT-guided drainage catheter placement into dominant abscess within the anterior inferior aspect the right lobe of the liver yielding 35 cc of purulent material. 3. Attempted though ultimately unsuccessful placement of a percutaneous drainage catheter into the ill-defined suspected abscess within the porta hepatis abscess, secondary to patient's inability to tolerate the procedure as well as development of rigors following placement of the 2 additional hepatic abscess drainage catheters. 4. A representative sample of aspirated purulent material was sent to the laboratory for analysis. PLAN: - As the suspected abscess within the dome of the right lobe of the liver and potential abscess within the porta hepatis remain undrained, would have a low threshold to repeat an IV only abdominal CT if patient has infectious symptoms do not defervesce within the next several days, however note, both collections may be NOT amenable to image guided drainage secondary to location and patient body habitus. Electronically Signed   By: Sandi Mariscal M.D.   On: 06/30/2017 14:55    Labs:  CBC:  Recent Labs  07/01/17 0318 07/02/17 0522 07/03/17 0235 07/04/17 0505  WBC 33.1* 28.3* 20.9* 22.5*  HGB 10.7* 11.4* 11.6* 11.2*  HCT 34.0* 35.3* 36.5* 36.0*  PLT  517* 520* 557* 602*  COAGS:  Recent Labs  06/30/17 0709  INR 1.24    BMP:  Recent Labs  06/30/17 0709 07/01/17 0318 07/02/17 0522 07/03/17 0235  NA 133* 135 135 137  K 3.7 3.7 3.5 3.9  CL 96* 101 101 102  CO2 27 23 23 26   GLUCOSE 219* 232* 221* 192*  BUN <5* 8 7 5*  CALCIUM 8.2* 8.1* 8.4* 8.5*  CREATININE 0.80 0.86 0.71 0.74  GFRNONAA >60 >60 >60 >60  GFRAA >60 >60 >60 >60    LIVER FUNCTION TESTS:  Recent Labs  06/10/17 1650 06/28/17 1232 06/30/17 0709 07/02/17 0522 07/03/17 0235  BILITOT 0.9 3.3* 1.7* 0.9  --   AST 23 115* 38 25  --   ALT 32 73* 40 27  --   ALKPHOS 150* 474* 406* 337*  --   PROT 7.7 7.9 7.5 7.8  --   ALBUMIN 2.9* 2.2* 1.7* 1.6*  1.6* 1.6*    Assessment and Plan: 1. Hepatic abscesses, s/p drain placement x 2  Both drains need to be irrigated. Cont placement for now WBC did increase today.  Per [facility] note, if still up tomorrow etc, will plan for repeat CT tomorrow or soon. Will follow  Electronically Signed: Donia Yokum E 07/04/2017, 10:03 AM   I spent a total of 15 Minutes at the the patient's bedside AND on the patient's hospital floor or unit, greater than 50% of which was counseling/coordinating care for hepatic abscesses

## 2017-07-04 NOTE — Progress Notes (Signed)
CSW spoke with pt concerning plan for time of DC.  Patient being considered for CIR- CSW explained SNF as possible alternative option for rehab/IV antibiotics/drain maintenance. Patient not interested in SNF and believes he can manage at home with help from brother and sister if CIR is not an option.  RNCM informed- CSW signing off  [name] Ny, LCSW Clinical Social Worker [phone]

## 2017-07-04 NOTE — Plan of Care (Signed)
Problem: Pain Managment: Goal: General experience of comfort will improve Outcome: Progressing Patient vertebralis understanding pain med needs to be given every 4 hours and movement is helpful for soreness.

## 2017-07-04 NOTE — Consult Note (Signed)
Physical Medicine and Rehabilitation Consult Reason for Consult: Decreased functional mobility Referring Physician: [facility]   HPI: [name] is a 51 y.o. right handed male with history of diabetes mellitus, gout, hypertension. Per chart review and patient, patient lives with brother. Independent prior to admission and works Psychologist, educational. One level home with level entry. Presented 06/28/2017 to [facility] with abdominal pain, fatigue, chills and unintentional weight loss. Abdominal pain radiated to his back and shoulders. WBC of 21,900. Mild hyponatremia 131, LFTs elevated. Urinalysis negative. Lactic acid 1.4. Lipase 20. CT of abdomen and pelvis showed multiple intrahepatic lesions with inflammatory stranding, abscess adjacent to the gallbladder 8 x 3 cm. Placed on broad-spectrum antibiotics. Interventional radiology consult for aspiration and drainage of abdominal fluid collections. Infectious disease consulted after blood culture showed strep bacteremia. Echocardiogram with ejection fraction of 16% grade 1 diastolic dysfunction without vegetation. Plan antibiotic therapy 2 weeks. Subcutaneous Lovenox for DVT prophylaxis. Physical and occupational therapy evaluations completed with recommendations of physical medicine rehabilitation consult.   Review of Systems  Constitutional: Positive for chills, malaise/fatigue and weight loss. Negative for fever.  HENT: Negative for hearing loss.   Eyes: Negative for blurred vision and double vision.  Respiratory: Negative for cough and shortness of breath.   Cardiovascular: Negative for chest pain and palpitations.  Gastrointestinal: Positive for abdominal pain and nausea.  Genitourinary: Negative for dysuria, flank pain and hematuria.  Musculoskeletal: Positive for myalgias.  Skin: Negative for rash.  Neurological: Positive for weakness. Negative for seizures.  All other systems reviewed and are negative.  Past Medical History:    Diagnosis Date  . Arthritis   . Diabetes mellitus without complication ([place])    diagnosed about age 67  . Gout   . Hypertension    boarderline   Past Surgical History:  Procedure Laterality Date  . HERNIA REPAIR  2008  . MASS EXCISION Left age 64   L thigh- benign   Family History  Problem Relation Age of Onset  . Cancer Mother   . Diabetes Mother   . Heart failure Father   . Hypertension Father   . Diabetes Father    Social History:  reports that he quit smoking about 28 years ago. His smoking use included Cigarettes. He has a 2.50 pack-year smoking history. He has never used smokeless tobacco. He reports that he does not drink alcohol or use drugs. Allergies: No Known Allergies Medications Prior to Admission  Medication Sig Dispense Refill  . oxyCODONE-acetaminophen (PERCOCET) 10-325 MG tablet Take 1 tablet by mouth every 4 (four) hours as needed for pain. 150 tablet 0  . albuterol (PROVENTIL HFA;VENTOLIN HFA) 108 (90 Base) MCG/ACT inhaler Inhale 2 puffs into the lungs every 6 (six) hours as needed for wheezing or shortness of breath. 1 Inhaler 0  . benzonatate (TESSALON PERLES) 100 MG capsule 1-2 po q 8 hour prn cough (Patient taking differently: Take 100-200 mg by mouth 3 (three) times daily as needed for cough. 1-2 po q 8 hour prn cough) 20 capsule 3  . cephALEXin (KEFLEX) 500 MG capsule Take 1 capsule (500 mg total) by mouth 4 (four) times daily. 28 capsule 0  . metFORMIN (GLUCOPHAGE) 1000 MG tablet Take 1 tablet (1,000 mg total) by mouth 2 (two) times daily with a meal. 180 tablet 3    Home: Home Living Family/patient expects to be discharged to:: Private residence Living Arrangements: Other (Comment) (brother) Available Help at Discharge: Family, Available 24 hours/day Type  of Home: House Home Access: Level entry Home Layout: One level Bathroom Shower/Tub: Tub/shower unit, Door [employer] Toilet: Handicapped height Home Equipment: None  Functional History: Prior  Function Level of Independence: Independent Comments: Drives Functional Status:  Mobility: Bed Mobility Overal bed mobility: Needs Assistance Bed Mobility: Supine to Sit, Sit to Supine Supine to sit: Min assist Sit to supine: Mod assist General bed mobility comments: Assist to bring BLE's onto bed. Transfers Overall transfer level: Needs assistance Equipment used: Rolling walker (2 wheeled) Transfers: Sit to/from Stand Sit to Stand: Min assist, +2 safety/equipment General transfer comment: for steadying balance due to pain Ambulation/Gait Ambulation/Gait assistance: Min assist, +2 safety/equipment Ambulation Distance (Feet): 84 Feet Assistive device: Rolling walker (2 wheeled) Gait Pattern/deviations: Step-through pattern, Decreased step length - right, Decreased step length - left, Decreased stride length, Shuffle, Trunk flexed General Gait Details: Verbal cues to keep feet inside RW, and to stand upright.  Patient with slow gait speed.  Patient able to stand by sink and wash own bottom with min support for balance. Gait velocity: decreased Gait velocity interpretation: Below normal speed for age/gender    ADL: ADL Overall ADL's : Needs assistance/impaired Eating/Feeding: Set up, Sitting Grooming: Minimal assistance, Sitting Upper Body Bathing: Moderate assistance, Sitting Lower Body Bathing: Maximal assistance, Sit to/from stand Upper Body Dressing : Moderate assistance, Sitting Lower Body Dressing: Maximal assistance, Sit to/from stand Functional mobility during ADLs: Minimal assistance, Rolling walker General ADL Comments: Min assist for sit to stand from EOB with few side steps toward [facility] for repositioning. Pt reports he just finished walking with PT  Cognition: Cognition Overall Cognitive Status: Within Functional Limits for tasks assessed Orientation Level: Oriented X4 Cognition Arousal/Alertness: Awake/alert Behavior During Therapy: WFL for tasks  assessed/performed Overall Cognitive Status: Within Functional Limits for tasks assessed  Blood pressure (!) 143/101, pulse 82, temperature 98 F (36.7 C), temperature source Oral, resp. rate 18, height 5\' 7"  (1.702 m), weight (!) 170.5 kg (375 lb 12.8 oz), SpO2 97 %. Physical Exam  Vitals reviewed. Constitutional: He is oriented to person, place, and time. He appears well-developed.  51 year old right-handed obese male  HENT:  Head: Normocephalic and atraumatic.  Eyes: EOM are normal. Right eye exhibits no discharge. Left eye exhibits no discharge.  Neck: Normal range of motion. Neck supple. No thyromegaly present.  Cardiovascular: Normal rate and regular rhythm.   Respiratory: Effort normal and breath sounds normal. No respiratory distress.  GI: Soft.  Abdomen appears mildly distended with positive bowel sounds  Musculoskeletal: He exhibits no edema or tenderness.  Neurological: He is alert and oriented to person, place, and time.  Motor: LUE/LLE 4-/5 proximal to distal RUE: 4-/5 proximal to distal (weaker than LUE) RLE: HF< KE 2+/5, ADF/PF 4/5 (pain inhibition)  Skin: Skin is warm and dry.  +JPs  Psychiatric: His affect is blunt.    Results for orders placed or performed during the hospital encounter of 06/28/17 (from the past 24 hour(s))  Glucose, capillary     Status: Abnormal   Collection Time: 07/03/17  8:13 AM  Result Value Ref Range   Glucose-Capillary 175 (H) 65 - 99 mg/dL  Glucose, capillary     Status: Abnormal   Collection Time: 07/03/17 12:11 PM  Result Value Ref Range   Glucose-Capillary 235 (H) 65 - 99 mg/dL  Glucose, capillary     Status: Abnormal   Collection Time: 07/03/17  3:53 PM  Result Value Ref Range   Glucose-Capillary 165 (H) 65 - 99 mg/dL  Glucose, capillary     Status: Abnormal   Collection Time: 07/03/17  9:27 PM  Result Value Ref Range   Glucose-Capillary 163 (H) 65 - 99 mg/dL  CBC     Status: Abnormal   Collection Time: 07/04/17  5:05 AM    Result Value Ref Range   WBC 22.5 (H) 4.0 - 10.5 K/uL   RBC 4.39 4.22 - 5.81 MIL/uL   Hemoglobin 11.2 (L) 13.0 - 17.0 g/dL   HCT 36.0 (L) 39.0 - 52.0 %   MCV 82.0 78.0 - 100.0 fL   MCH 25.5 (L) 26.0 - 34.0 pg   MCHC 31.1 30.0 - 36.0 g/dL   RDW 15.4 11.5 - 15.5 %   Platelets 602 (H) 150 - 400 K/uL   No results found.  Assessment/Plan: Diagnosis: Debility Labs independently reviewed.  Records reviewed and summated above.  1. Does the need for close, 24 hr/day medical supervision in concert with the patient's rehab needs make it unreasonable for this patient to be served in a less intensive setting? Yes  2. Co-Morbidities requiring supervision/potential complications: diastolic dysfunction (monitor for signs/symptoms of fluid overload), bacteremia (cont abx), diabetes mellitus (Monitor in accordance with exercise and adjust meds as necessary), gout (monitor for flares), HTN (monitor and provide prns in accordance with increased physical exertion and pain), post-op pain (Biofeedback training with therapies to help reduce reliance on opiate pain medications, particularly IV dilaudid, monitor pain control during therapies, and sedation at rest and titrate to maximum efficacy to ensure participation and gains in therapies), leukocytosis (cont to monitor for signs and symptoms of infection, further workup if indicated), ABLA (transfuse if necessary to ensure appropriate perfusion for increased activity tolerance) 3. Due to safety, skin/wound care, disease management, pain management and patient education, does the patient require 24 hr/day rehab nursing? Yes 4. Does the patient require coordinated care of a physician, rehab nurse, PT (1-2 hrs/day, 5 days/week) and OT (1-2 hrs/day, 5 days/week) to address physical and functional deficits in the context of the above medical diagnosis(es)? Yes Addressing deficits in the following areas: balance, endurance, locomotion, strength, transferring, bathing,  dressing, toileting and psychosocial support 5. Can the patient actively participate in an intensive therapy program of at least 3 hrs of therapy per day at least 5 days per week? Potentially 6. The potential for patient to make measurable gains while on inpatient rehab is excellent 7. Anticipated functional outcomes upon discharge from inpatient rehab are supervision  with PT, supervision with OT, n/a with SLP. 8. Estimated rehab length of stay to reach the above functional goals is: 9-13 days. 9. Anticipated D/C setting: Home 10. Anticipated post D/C treatments: HH therapy and Home excercise program 11. Overall Rehab/Functional Prognosis: excellent  RECOMMENDATIONS: This patient's condition is appropriate for continued rehabilitative care in the following setting: CIR when able to tolerate 3 hours of therapy/day. Patient has agreed to participate in recommended program. Yes Note that insurance prior authorization may be required for reimbursement for recommended care.  Comment: Rehab Admissions Coordinator to follow up.  Delice Lesch, MD, ABPMR Lauraine Rinne J., PA-C 07/04/2017

## 2017-07-05 ENCOUNTER — Inpatient Hospital Stay (HOSPITAL_COMMUNITY): Payer: Self-pay

## 2017-07-05 ENCOUNTER — Encounter (HOSPITAL_COMMUNITY): Payer: Self-pay | Admitting: Radiology

## 2017-07-05 LAB — GLUCOSE, CAPILLARY
GLUCOSE-CAPILLARY: 149 mg/dL — AB (ref 65–99)
GLUCOSE-CAPILLARY: 157 mg/dL — AB (ref 65–99)
GLUCOSE-CAPILLARY: 148 mg/dL — AB (ref 65–99)
Glucose-Capillary: 136 mg/dL — ABNORMAL HIGH (ref 65–99)
Glucose-Capillary: 166 mg/dL — ABNORMAL HIGH (ref 65–99)
Glucose-Capillary: 180 mg/dL — ABNORMAL HIGH (ref 65–99)

## 2017-07-05 LAB — DIFFERENTIAL
BASOS ABS: 0 10*3/uL (ref 0.0–0.1)
Basophils Relative: 0 %
EOS PCT: 1 %
Eosinophils Absolute: 0.2 10*3/uL (ref 0.0–0.7)
LYMPHS PCT: 14 %
Lymphs Abs: 3.4 10*3/uL (ref 0.7–4.0)
MONOS PCT: 11 %
Monocytes Absolute: 2.7 10*3/uL — ABNORMAL HIGH (ref 0.1–1.0)
Neutro Abs: 18.1 10*3/uL — ABNORMAL HIGH (ref 1.7–7.7)
Neutrophils Relative %: 74 %

## 2017-07-05 LAB — CBC
HCT: 39 % (ref 39.0–52.0)
HEMOGLOBIN: 12.4 g/dL — AB (ref 13.0–17.0)
MCH: 26.1 pg (ref 26.0–34.0)
MCHC: 31.8 g/dL (ref 30.0–36.0)
MCV: 81.9 fL (ref 78.0–100.0)
Platelets: 645 10*3/uL — ABNORMAL HIGH (ref 150–400)
RBC: 4.76 MIL/uL (ref 4.22–5.81)
RDW: 15.1 % (ref 11.5–15.5)
WBC: 24.4 10*3/uL — ABNORMAL HIGH (ref 4.0–10.5)

## 2017-07-05 LAB — PHOSPHORUS: Phosphorus: 4 mg/dL (ref 2.5–4.6)

## 2017-07-05 LAB — COMPREHENSIVE METABOLIC PANEL
ALT: 16 U/L — AB (ref 17–63)
AST: 19 U/L (ref 15–41)
Albumin: 1.8 g/dL — ABNORMAL LOW (ref 3.5–5.0)
Alkaline Phosphatase: 294 U/L — ABNORMAL HIGH (ref 38–126)
Anion gap: 8 (ref 5–15)
BUN: 8 mg/dL (ref 6–20)
CHLORIDE: 103 mmol/L (ref 101–111)
CO2: 26 mmol/L (ref 22–32)
CREATININE: 0.79 mg/dL (ref 0.61–1.24)
Calcium: 8.7 mg/dL — ABNORMAL LOW (ref 8.9–10.3)
GFR calc Af Amer: >60 mL/min (ref >60)
GLUCOSE: 156 mg/dL — AB (ref 65–99)
Potassium: 4.2 mmol/L (ref 3.5–5.1)
SODIUM: 137 mmol/L (ref 135–145)
Total Bilirubin: 1 mg/dL (ref 0.3–1.2)
Total Protein: 7.4 g/dL (ref 6.5–8.1)

## 2017-07-05 LAB — AEROBIC/ANAEROBIC CULTURE W GRAM STAIN (SURGICAL/DEEP WOUND)

## 2017-07-05 LAB — CULTURE, BLOOD (ROUTINE X 2): Special Requests: ADEQUATE

## 2017-07-05 LAB — MAGNESIUM: MAGNESIUM: 2.1 mg/dL (ref 1.7–2.4)

## 2017-07-05 MED ORDER — IOPAMIDOL (ISOVUE-300) INJECTION 61%
INTRAVENOUS | Status: AC
  Administered 2017-07-05: 100 mL
  Filled 2017-07-05: qty 100

## 2017-07-05 MED ORDER — SODIUM CHLORIDE 0.9 % IV SOLN
3.0000 g | Freq: Four times a day (QID) | INTRAVENOUS | Status: DC
  Administered 2017-07-05 – 2017-07-13 (×33): 3 g via INTRAVENOUS
  Filled 2017-07-05 (×35): qty 3

## 2017-07-05 MED ORDER — LORATADINE 10 MG PO TABS
10.0000 mg | ORAL_TABLET | Freq: Every day | ORAL | Status: DC
  Administered 2017-07-06 – 2017-07-13 (×8): 10 mg via ORAL
  Filled 2017-07-05 (×9): qty 1

## 2017-07-05 MED ORDER — IOPAMIDOL (ISOVUE-300) INJECTION 61%
INTRAVENOUS | Status: AC
  Filled 2017-07-05: qty 30

## 2017-07-05 MED ORDER — HYDROMORPHONE HCL 1 MG/ML IJ SOLN
1.0000 mg | INTRAMUSCULAR | Status: DC | PRN
  Administered 2017-07-05: 1 mg via INTRAVENOUS
  Filled 2017-07-05: qty 1

## 2017-07-05 MED ORDER — OXYCODONE HCL 5 MG PO TABS
10.0000 mg | ORAL_TABLET | ORAL | Status: DC | PRN
  Administered 2017-07-05 – 2017-07-13 (×33): 10 mg via ORAL
  Filled 2017-07-05 (×33): qty 2

## 2017-07-05 MED ORDER — TECHNETIUM TC 99M MEBROFENIN IV KIT
5.0000 | PACK | Freq: Once | INTRAVENOUS | Status: AC | PRN
  Administered 2017-07-05: 5 via INTRAVENOUS

## 2017-07-05 MED ORDER — ONDANSETRON HCL 4 MG/2ML IJ SOLN
4.0000 mg | Freq: Four times a day (QID) | INTRAMUSCULAR | Status: DC | PRN
  Administered 2017-07-05: 4 mg via INTRAVENOUS
  Filled 2017-07-05: qty 2

## 2017-07-05 NOTE — Progress Notes (Signed)
[facility] for Infectious Disease    Date of Admission:  06/28/2017   Total days of antibiotics 8        Day 6 ceftriaxone/metro, day 2 fluc   ID: [name] is a 51 y.o. male with  Hepatic abscess and secondary strep bacteremia Active Problems:   Hepatic abscess   Abscess, hepatic   Acute cholecystitis   Type 2 diabetes mellitus without complication, without long-term current use of insulin (HCC)   Bacteremia due to Gram-positive bacteria   Diarrhea   Diastolic dysfunction   Bacteremia   History of gout   Benign essential HTN   Post-operative pain   Acute blood loss anemia   Leukocytosis    Subjective: Having chills and ongoing soreness near his drain sites at RUQ,   Underwent hida scan which was negative  Medications:  . iopamidol      . enoxaparin (LOVENOX) injection  40 mg Subcutaneous Daily  . fluconazole  400 mg Oral Daily  . insulin aspart  0-9 Units Subcutaneous TID WC  . insulin glargine  20 Units Subcutaneous QHS  . lactobacillus acidophilus  2 tablet Oral TID  . loratadine  10 mg Oral Daily  . pantoprazole  40 mg Oral Daily  . sodium chloride flush  10-40 mL Intracatheter Q12H  . sodium chloride flush  5 mL Intravenous Q8H    Objective: Vital signs in last 24 hours: Temp:  [97.5 F (36.4 C)-98.7 F (37.1 C)] 97.5 F (36.4 C) (10/30 1600) Pulse Rate:  [85-116] 94 (10/30 1600) Resp:  [18-25] 20 (10/30 1600) BP: (115-158)/(72-102) 123/84 (10/30 1600) SpO2:  [92 %-96 %] 96 % (10/30 1600) Physical Exam  Constitutional: He is oriented to person, place, and time. He appears well-developed and well-nourished. No distress.  HENT:  Mouth/Throat: Oropharynx is clear and moist. No oropharyngeal exudate.  Cardiovascular: Normal rate, regular rhythm and normal heart sounds. Exam reveals no gallop and no friction rub.  No murmur heard.  Pulmonary/Chest: Effort normal and breath sounds normal. No respiratory distress. He has no wheezes.  Abdominal:  Soft. Bowel sounds are normal. Mild distension. Tenderness inferior to drains x 2 Neurological: He is alert and oriented to person, place, and time.  Skin: Skin is warm and dry. No rash noted. No erythema.  Psychiatric: He has a normal mood and affect. His behavior is normal.     Lab Results  Recent Labs  07/03/17 0235 07/04/17 0505 07/05/17 0605  WBC 20.9* 22.5* 24.4*  HGB 11.6* 11.2* 12.4*  HCT 36.5* 36.0* 39.0  NA 137  --  137  K 3.9  --  4.2  CL 102  --  103  CO2 26  --  26  BUN 5*  --  8  CREATININE 0.74  --  0.79   Liver Panel  Recent Labs  07/03/17 0235 07/05/17 0605  PROT  --  7.4  ALBUMIN 1.6* 1.8*  AST  --  19  ALT  --  16*  ALKPHOS  --  294*  BILITOT  --  1.0    Microbiology: 10/25 liver abscess = strep +,candida Studies/Results: Nm Hepato W/eject Fract  Result Date: 07/05/2017 CLINICAL DATA:  Abdominal pain. EXAM: NUCLEAR MEDICINE HEPATOBILIARY IMAGING WITH GALLBLADDER EF TECHNIQUE: Sequential images of the abdomen were obtained out to 60 minutes following intravenous administration of radiopharmaceutical. After oral ingestion of Ensure, gallbladder ejection fraction was determined. At 60 min, normal ejection fraction is greater than 33%. RADIOPHARMACEUTICALS:  5.0  mCi Tc-33m  Choletec IV COMPARISON:  CT 06/30/2017. FINDINGS: Prompt uptake and biliary excretion of activity by the liver is seen. Small photopenic area in the right lobe liver most likely related to a previously identified abscess. Gallbladder activity is visualized, consistent with patency of cystic duct. Biliary activity passes into small bowel, consistent with patent common bile duct. Calculated gallbladder ejection fraction is 61%. (Normal gallbladder ejection fraction with Ensure is greater than 33%.) IMPRESSION: Normal visualization of the gallbladder. Normal gallbladder ejection fraction. Electronically Signed   By: Marcello Moores  Register   On: 07/05/2017 13:59     Assessment/Plan: Liver  abscess = will change abtx to amp/sub and fluconazole dose to 400mg  daily  Leukocytosis = would expect that this should be trending downward. recommend.repeat CT imaging to assess liver abscess size since drainage or to see if any there is any obstruction to biliary system.  Chills/fevers = will repeat blood cx  abd pain = defer to primary team for management  Baxter Flattery [facility] Infectious Diseases Cell: [phone] Pager: [phone]  07/05/2017, 4:10 PM

## 2017-07-05 NOTE — Plan of Care (Signed)
Problem: Health Behavior/Discharge Planning: Goal: Ability to manage health-related needs will improve Outcome: Adequate for Discharge Instructed to patient that he will nedd help with his IV at home.  Patient stated that his sister is a Therapist, sports of 20 plus years willing to come over and assist with any needs,

## 2017-07-05 NOTE — Progress Notes (Signed)
PROGRESS NOTE    [name]  HQI:[id number] DOB: [birth year] DOA: 06/28/2017 PCP: [name], PA-C  Outpatient Specialists:     Brief Narrative:  Patient is 51 y.o. male w/ PMHx significant for T2DM, HTN, arthritis presented to [facility] ED with abdominal pain, fatigue, and weight loss.  Labs were significant for leukocytosis, mild anemia, abnormal LFTs, and hyperglycemia.  CXR show mild pulmonary venous congestion and cardiomegaly.  CT abdomen/pelvis showed multiple intrahepatic lesions with perihepatic inflammatory stranding and fluid collection around the gallbladder with thickening of the gallbladder wall.  General surgery was consulted who recommended patient be transferred to [facility].  Started flagyl and zosyn on 10/23 per pharmacy consult, flagyl discontinued 10/24.  General surgery evaluated patient 10/24 - felt emergent cholecystectomy was not indicated at that time, recommended following clinically.  IR was consulted - two percutaneous drains placed 10/25.  Drain fluid positive for candida, PO fluconazole started.  Hep panel negative.  C diff negative.  Right cephalic PICC placed 13/24.  TTE (10/26) - LV EF 60-65%, no emboli.   Assessment & Plan:   Active Problems:   Hepatic abscess   Abscess, hepatic   Acute cholecystitis   Type 2 diabetes mellitus without complication, without long-term current use of insulin (HCC)   Bacteremia due to Gram-positive bacteria   Diarrhea   Diastolic dysfunction   Bacteremia   History of gout   Benign essential HTN   Post-operative pain   Acute blood loss anemia   Leukocytosis   1. Intrahepatic Abscesses -CT abdomen/pelvis showed multiple intrahepatic lesions w/ perihepatic inflammatory stranding -Blood culture (10/23) - gram positive cocci, resistant to clindamycin.  IV Unasyn started per ID -Percutaneous drains (2) placed on 10/25 by IR.   -Drain culture positive for candida albicans, PO fluconazole initiated 10/28 -Hepatic  panel (10/23) negative -Continue IV abx and monitoring LFTs -Obtain CT abdomen as recommended by surgery  2. Group F streptococcal bacteremia -Blood culture (10/23) growing streptococcus group F.   - TTE (10/26) - LV EF 60-65%, no emboli -C diff negative.  -WBC 24.4 today.  -Continue IV abx, monitor  3. Type 2 Diabetes Mellitus -Hemoglobin A1c 13.4 on 10/23.  CBG 166 this morning.  -Continue sliding scale Novolog and carb modified diet -Continue CBG monitoring  3. Normocytic anemia of unknown etiology -Iron profile (10/25) showed low iron and TIBC, increased ferritin.  Iron deficient - question of nutritional vs. infectious status.  Avoiding supplemental iron in setting of bacteremia. -Hemoglobin 12.4 this morning.  Continue to monitor  4. Suspected acute cholecystitis on CT -Per general surgery today, if gallbladder remains potential issue in setting of intrahepatic abscesses s/p perc drain placement, and leukocytosis, HIDA scan may be ordered. -HIDA scan on 10/30 - pending.    5. Thrombocytosis in the setting of hepatic infection -Plt trending up, 645 today.  Etiologies include infection, anemia, inflammation. -Continue Lovenox for DVT prophylaxis -Continue to monitor  DVT prophylaxis: Lovenox Code Status: Full Family Communication: Spoke with patient Disposition Plan: CIR if insurance covers, otherwise discharge to home with sister   Consultants:   General surgery  Interventional radiology  ID  Procedures:    CT guided percutaneous catheter placement (10/25)  PICC, right cephalic (40/10)  Antimicrobials:   IV Flagyl (10/23 - 10/24)   IV Zosyn (10/23 - 10/25)  IV Vancomycin (10/24)  IV Ceftriaxone (10/25 - 10/30)  IV Metronidazole (10/23 - )  PO Fluconazole (10/28 - )  IV Unasyn (10/30 - )  Subjective: Patient is still experiencing RUQ pain.  States he has started to cough and sneeze "since last night" and has had intermittent dyspnea.  Denies  hemoptysis, chest pain.  He feels he is "not able to cough because of the pain".  Continues to tolerating food/liquids well despite decreased appetite, secondary to pain.  Denies nausea/vomiting.  Feels that he is passing more flatus.  Per RN in patient's room, patient's abdomen is more distended, passed a large volume of stool today.  Objective: Vitals:   07/04/17 2327 07/05/17 0323 07/05/17 0751 07/05/17 1148  BP: (!) 142/89 (!) 149/102 (!) 149/102 115/72  Pulse: (!) 104 (!) 116 85 98  Resp: 18 (!) 22 (!) 22 20  Temp: 97.9 F (36.6 C) 97.6 F (36.4 C) 98.4 F (36.9 C) 98 F (36.7 C)  TempSrc: Oral Oral Oral Oral  SpO2: 92% 94% 94% 94%  Weight:      Height:        Intake/Output Summary (Last 24 hours) at 07/05/17 1237 Last data filed at 07/05/17 0752  Gross per 24 hour  Intake              890 ml  Output             1950 ml  Net            -1060 ml   Filed Weights   06/28/17 1535 06/29/17 0403  Weight: (!) 170.1 kg (375 lb) (!) 170.5 kg (375 lb 12.8 oz)    Examination:  General exam: Laying in hospital bed in no acute distress Respiratory system: CTAB, decreased BS bilaterally.  No wheezing, rhonchi, crackles.  Decreased full respirations secondary to abdominal pain. Cardiovascular system: S1/S2 hear, RRR.  No MGR.  1+ pitting edema bilaterally to knees. Gastrointestinal system: Obese abdomen, distended.  TTP over umbilicus, RUQ.  Drain exit sites in RUQ dressed - clean, dry, intact.  Small amount of serosanguinous fluid in drain bulb.  Hyperactive bowel sounds in all four quadrants. Central nervous system: Alert and oriented. No focal neurological deficits. Extremities: Symmetric 5 x 5 power. Skin: No rashes, lesions or ulcers Psychiatry: Judgement and insight appear normal. Mood & affect appropriate.     Data Reviewed: I have personally reviewed following labs and imaging studies  CBC:  Recent Labs Lab 07/01/17 0318 07/02/17 0522 07/03/17 0235 07/04/17 0505  07/05/17 0605  WBC 33.1* 28.3* 20.9* 22.5* 24.4*  NEUTROABS  --   --   --   --  18.1*  HGB 10.7* 11.4* 11.6* 11.2* 12.4*  HCT 34.0* 35.3* 36.5* 36.0* 39.0  MCV 81.7 81.1 82.2 82.0 81.9  PLT 517* 520* 557* 602* 027*   Basic Metabolic Panel:  Recent Labs Lab 06/30/17 0709 07/01/17 0318 07/02/17 0522 07/03/17 0235 07/05/17 0605  NA 133* 135 135 137 137  K 3.7 3.7 3.5 3.9 4.2  CL 96* 101 101 102 103  CO2 27 23 23 26 26   GLUCOSE 219* 232* 221* 192* 156*  BUN <5* 8 7 5* 8  CREATININE 0.80 0.86 0.71 0.74 0.79  CALCIUM 8.2* 8.1* 8.4* 8.5* 8.7*  MG  --  1.9  --   --  2.1  PHOS  --   --  3.5 4.4 4.0   GFR: Estimated Creatinine Clearance: 164.8 mL/min (by C-G formula based on SCr of 0.79 mg/dL). Liver Function Tests:  Recent Labs Lab 06/30/17 0709 07/02/17 0522 07/03/17 0235 07/05/17 0605  AST 38 25  --  19  ALT  40 27  --  16*  ALKPHOS 406* 337*  --  294*  BILITOT 1.7* 0.9  --  1.0  PROT 7.5 7.8  --  7.4  ALBUMIN 1.7* 1.6*  1.6* 1.6* 1.8*    Recent Labs Lab 06/28/17 1849  LIPASE 20   No results for input(s): AMMONIA in the last 168 hours. Coagulation Profile:  Recent Labs Lab 06/30/17 0709  INR 1.24   Cardiac Enzymes: No results for input(s): CKTOTAL, CKMB, CKMBINDEX, TROPONINI in the last 168 hours. BNP (last 3 results) No results for input(s): PROBNP in the last 8760 hours. HbA1C: No results for input(s): HGBA1C in the last 72 hours. CBG:  Recent Labs Lab 07/04/17 1952 07/04/17 2330 07/05/17 0328 07/05/17 0746 07/05/17 1146  GLUCAP 181* 162* 136* 166* 180*   Lipid Profile: No results for input(s): CHOL, HDL, LDLCALC, TRIG, CHOLHDL, LDLDIRECT in the last 72 hours. Thyroid Function Tests: No results for input(s): TSH, T4TOTAL, FREET4, T3FREE, THYROIDAB in the last 72 hours. Anemia Panel: No results for input(s): VITAMINB12, FOLATE, FERRITIN, TIBC, IRON, RETICCTPCT in the last 72 hours. Urine analysis:    Component Value Date/Time    COLORURINE AMBER (A) 06/28/2017 1828   APPEARANCEUR CLEAR 06/28/2017 1828   LABSPEC 1.040 (H) 06/28/2017 1828   PHURINE 6.0 06/28/2017 1828   GLUCOSEU 50 (A) 06/28/2017 1828   HGBUR NEGATIVE 06/28/2017 1828   BILIRUBINUR SMALL (A) 06/28/2017 1828   BILIRUBINUR LARGE 06/28/2017 1155   [place] 06/28/2017 1828   PROTEINUR 30 (A) 06/28/2017 1828   UROBILINOGEN 2.0 (A) 06/28/2017 1155   NITRITE NEGATIVE 06/28/2017 1828   LEUKOCYTESUR NEGATIVE 06/28/2017 1828   Sepsis Labs: @LABRCNTIP (procalcitonin:4,lacticidven:4)  ) Recent Results (from the past 240 hour(s))  Urine culture     Status: None   Collection Time: 06/28/17  6:28 PM  Result Value Ref Range Status   Specimen Description URINE, RANDOM  Final   Special Requests NONE  Final   Culture   Final    NO GROWTH Performed at [facility], [place] [address], [place], [place] [address]    Report Status 06/30/2017 FINAL  Final  Blood culture (routine x 2)     Status: Abnormal   Collection Time: 06/28/17  6:49 PM  Result Value Ref Range Status   Specimen Description RIGHT ANTECUBITAL  Final   Special Requests   Final    BOTTLES DRAWN AEROBIC AND ANAEROBIC Blood Culture adequate volume   Culture  Setup Time   Final    GRAM POSITIVE COCCI Gram Stain Report Called to,Read Back By and Verified With: ANGELICA DAVIS. @ 0981 ON [id number] BY HENDERSON L. ANAEROBIC AEROBIC Gram Stain Report Called to,Read Back By and Verified With: MIZE,J @ 1930 ON 10.24.18 BY BOWMAN,L CRITICAL RESULT CALLED TO, READ BACK BY AND VERIFIED WITH: J.LEDFORD PHARMD 06/30/17 0207 L.CHAMPION Performed at [facility], [place] [address] [place], [place] [address]    Culture STREPTOCOCCUS GROUP F (A)  Final   Report Status 07/05/2017 FINAL  Final   Organism ID, Bacteria STREPTOCOCCUS GROUP F  Final      Susceptibility   Streptococcus group f - MIC*    CLINDAMYCIN RESISTANT Resistant     AMPICILLIN <=0.25 SENSITIVE Sensitive     VANCOMYCIN 0.5  SENSITIVE Sensitive     LEVOFLOXACIN <=0.25 SENSITIVE Sensitive     PENICILLIN Value in next row Sensitive      SENSITIVE0.12    * STREPTOCOCCUS GROUP F  Blood culture (routine  x 2)     Status: Abnormal   Collection Time: 06/28/17  6:49 PM  Result Value Ref Range Status   Specimen Description BLOOD RIGHT HAND  Final   Special Requests   Final    BOTTLES DRAWN AEROBIC AND ANAEROBIC Blood Culture adequate volume   Culture  Setup Time   Final    GRAM POSITIVE COCCI Gram Stain Report Called to,Read Back By and Verified With: ANGELICA DAVIS @ 6578 ON [id number] BY HENDERSON L. ANAEROBIC AEROBIC Gram Stain Report Called to,Read Back By and Verified With: MIZE, J @ 1930 ON 10.24.18 BY BOWMAN,L    Culture (A)  Final    STREPTOCOCCUS GROUP F SUSCEPTIBILITIES PERFORMED ON PREVIOUS CULTURE WITHIN THE LAST 5 DAYS. Performed at [facility], [place] [address] [place], [place] [address]    Report Status 07/03/2017 FINAL  Final  Blood Culture ID Panel (Reflexed)     Status: Abnormal   Collection Time: 06/28/17  6:49 PM  Result Value Ref Range Status   Enterococcus species NOT DETECTED NOT DETECTED Final   Listeria monocytogenes NOT DETECTED NOT DETECTED Final   Staphylococcus species NOT DETECTED NOT DETECTED Final   Staphylococcus aureus NOT DETECTED NOT DETECTED Final   Streptococcus species DETECTED (A) NOT DETECTED Final    Comment: Not Enterococcus species, Streptococcus agalactiae, Streptococcus pyogenes, or Streptococcus pneumoniae. CRITICAL RESULT CALLED TO, READ BACK BY AND VERIFIED WITH: J.LEDFORD PHARMD 06/30/17 0207 L.CHAMPION    Streptococcus agalactiae NOT DETECTED NOT DETECTED Final   Streptococcus pneumoniae NOT DETECTED NOT DETECTED Final   Streptococcus pyogenes NOT DETECTED NOT DETECTED Final   Acinetobacter baumannii NOT DETECTED NOT DETECTED Final   Enterobacteriaceae species NOT DETECTED NOT DETECTED Final   Enterobacter cloacae complex NOT DETECTED NOT DETECTED Final    Escherichia coli NOT DETECTED NOT DETECTED Final   Klebsiella oxytoca NOT DETECTED NOT DETECTED Final   Klebsiella pneumoniae NOT DETECTED NOT DETECTED Final   Proteus species NOT DETECTED NOT DETECTED Final   Serratia marcescens NOT DETECTED NOT DETECTED Final   Haemophilus influenzae NOT DETECTED NOT DETECTED Final   Neisseria meningitidis NOT DETECTED NOT DETECTED Final   Pseudomonas aeruginosa NOT DETECTED NOT DETECTED Final   Candida albicans NOT DETECTED NOT DETECTED Final   Candida glabrata NOT DETECTED NOT DETECTED Final   Candida krusei NOT DETECTED NOT DETECTED Final   Candida parapsilosis NOT DETECTED NOT DETECTED Final   Candida tropicalis NOT DETECTED NOT DETECTED Final    Comment: Performed at [facility], [place] [address] [place], [place] [address]  MRSA PCR Screening     Status: None   Collection Time: 06/29/17  4:12 AM  Result Value Ref Range Status   MRSA by PCR NEGATIVE NEGATIVE Final    Comment:        The GeneXpert MRSA Assay (FDA approved for NASAL specimens only), is one component of a comprehensive MRSA colonization surveillance program. It is not intended to diagnose MRSA infection nor to guide or monitor treatment for MRSA infections.   Culture, blood (routine x 2)     Status: None   Collection Time: 06/29/17  6:58 PM  Result Value Ref Range Status   Specimen Description BLOOD RIGHT HAND  Final   Special Requests   Final    BOTTLES DRAWN AEROBIC ONLY Blood Culture adequate volume   Culture NO GROWTH 5 DAYS  Final   Report Status 07/04/2017 FINAL  Final  Culture, blood (routine x 2)  Status: None   Collection Time: 06/29/17  7:09 PM  Result Value Ref Range Status   Specimen Description BLOOD RIGHT ANTECUBITAL  Final   Special Requests   Final    BOTTLES DRAWN AEROBIC AND ANAEROBIC Blood Culture adequate volume   Culture NO GROWTH 5 DAYS  Final   Report Status 07/04/2017 FINAL  Final  Aerobic/Anaerobic Culture (surgical/deep wound)      Status: None (Preliminary result)   Collection Time: 06/30/17  1:31 PM  Result Value Ref Range Status   Specimen Description ABSCESS  Final   Special Requests HEPATIC ABSCESS DRAINAGE  Final   Gram Stain   Final    ABUNDANT WBC PRESENT, PREDOMINANTLY PMN ABUNDANT GRAM POSITIVE COCCI ABUNDANT GRAM POSITIVE RODS    Culture   Final    FEW CANDIDA ALBICANS MODERATE STREPTOCOCCUS GROUP C MODERATE STREPTOCOCCUS GROUP F NO ANAEROBES ISOLATED    Report Status PENDING  Incomplete  C difficile quick scan w PCR reflex     Status: None   Collection Time: 07/01/17  8:00 PM  Result Value Ref Range Status   C Diff antigen NEGATIVE NEGATIVE Final   C Diff toxin NEGATIVE NEGATIVE Final   C Diff interpretation No C. difficile detected.  Final         Radiology Studies: No results found.      Scheduled Meds: . enoxaparin (LOVENOX) injection  40 mg Subcutaneous Daily  . fluconazole  400 mg Oral Daily  . insulin aspart  0-9 Units Subcutaneous TID WC  . insulin glargine  20 Units Subcutaneous QHS  . lactobacillus acidophilus  2 tablet Oral TID  . pantoprazole  40 mg Oral Daily  . sodium chloride flush  10-40 mL Intracatheter Q12H  . sodium chloride flush  5 mL Intravenous Q8H   Continuous Infusions: . ampicillin-sulbactam (UNASYN) IV 3 g (07/05/17 1204)     LOS: 7 days     [place] Hall-Potvin, PA-S [facility] Pager [phone]  If 7PM-7AM, please contact night-coverage [email or web address] Password [email or web address] 07/05/2017, 12:37 PM

## 2017-07-05 NOTE — Progress Notes (Signed)
Inpatient Rehabilitation  Received notification from PT that patient progressed well during session today and that they have updated their recommendations for home with home health therapy.  Will follow at a distance, in case needs change.  Call if questions.   Carmelia Roller., CCC/SLP Admission Coordinator  [place]  Cell [phone]

## 2017-07-05 NOTE — Progress Notes (Signed)
Physical Therapy Treatment Patient Details Name: [name] MRN: [id number] DOB: [birth year] Today's Date: 07/05/2017    History of Present Illness Patient is a 51 yo male admitted 06/28/17 with RUQ abdominal pain.  Patient with hepatic abscess and cholecystitis, with percutaneous drains placed 06/30/17.     PMH:  obesity, DM, HTN, gout, OA    PT Comments    Pt is making excellent progress with mobility. Only requiring supervision to amb around the unit. Recommend pt return home with sister and HHPT. Pt's biggest concern is the drains.   Follow Up Recommendations  Home health PT;Supervision - Intermittent     Equipment Recommendations  Rolling walker with 5" wheels;3in1 (PT)    Recommendations for Other Services       Precautions / Restrictions Precautions Precautions: None Precaution Comments: 2 percutaneous drains Restrictions Weight Bearing Restrictions: No    Mobility  Bed Mobility Overal bed mobility: Needs Assistance Bed Mobility: Supine to Sit;Sit to Supine     Supine to sit: Min assist;HOB elevated Sit to supine: Supervision   General bed mobility comments: Assist to elevate trunk into sitting  Transfers Overall transfer level: Needs assistance Equipment used: Rolling walker (2 wheeled) Transfers: Sit to/from Stand Sit to Stand: Supervision         General transfer comment: for lines and safety only  Ambulation/Gait Ambulation/Gait assistance: Supervision Ambulation Distance (Feet): 170 Feet Assistive device: Rolling walker (2 wheeled) Gait Pattern/deviations: Decreased stride length;Step-through pattern Gait velocity: decreased Gait velocity interpretation: Below normal speed for age/gender General Gait Details: supervision for lines/safety   Stairs            Wheelchair Mobility    Modified Rankin (Stroke Patients Only)       Balance Overall balance assessment: Needs assistance Sitting-balance support: No upper extremity  supported;Feet supported Sitting balance-Leahy Scale: Good     Standing balance support: No upper extremity supported;During functional activity Standing balance-Leahy Scale: Fair                              Cognition Arousal/Alertness: Awake/alert Behavior During Therapy: WFL for tasks assessed/performed Overall Cognitive Status: Within Functional Limits for tasks assessed                                        Exercises      General Comments        Pertinent Vitals/Pain Pain Assessment: Faces Faces Pain Scale: Hurts even more Pain Location: abdomen, R side with transitional movements Pain Descriptors / Indicators: Grimacing;Guarding Pain Intervention(s): Limited activity within patient's tolerance    Home Living                      Prior Function            PT Goals (current goals can now be found in the care plan section) Acute Rehab PT Goals PT Goal Formulation: With patient Time For Goal Achievement: 07/12/17 Potential to Achieve Goals: Good Progress towards PT goals: Goals met and updated - see care plan    Frequency    Min 3X/week      PT Plan Discharge plan needs to be updated    Co-evaluation              AM-PAC PT "6 Clicks" Daily Activity  Outcome Measure  Difficulty  turning over in bed (including adjusting bedclothes, sheets and blankets)?: A Little Difficulty moving from lying on back to sitting on the side of the bed? : Unable Difficulty sitting down on and standing up from a chair with arms (e.g., wheelchair, bedside commode, etc,.)?: A Little Help needed moving to and from a bed to chair (including a wheelchair)?: A Little Help needed walking in hospital room?: A Little Help needed climbing 3-5 steps with a railing? : A Little 6 Click Score: 16    End of Session   Activity Tolerance: Patient tolerated treatment well Patient left: in bed;with call bell/phone within reach   PT Visit  Diagnosis: Unsteadiness on feet (R26.81);Pain Pain - Right/Left: Right Pain - part of body:  (RUQ abdomen)     Time: 1610-9604 PT Time Calculation (min) (ACUTE ONLY): 22 min  Charges:  $Gait Training: 8-22 mins                    G Codes:       [facility] PT [place] 07/05/2017, 2:39 PM

## 2017-07-05 NOTE — Progress Notes (Addendum)
Inpatient Rehabilitation  Met with patient to discuss team's recommendation for IP Rehab.  Shared booklets, insurance verification letter, and answered initial questions.  Patient stated that he is hopeful to progress to a level where he can just go home with his sister; however, if he still needs rehab and medical care is agreeable to our program.  I will plan to follow for timing of medical readiness, ongoing therapy needs/tolerance, and IP Rehab bed availability.  Call if questions.   Carmelia Roller., CCC/SLP Admission Coordinator  [place]  Cell [phone]

## 2017-07-05 NOTE — Plan of Care (Signed)
Problem: Health Behavior/Discharge Planning: Goal: Ability to manage health-related needs will improve Outcome: Progressing Patient stated his sister would be able to help manage his Diabetes and meds at home.

## 2017-07-05 NOTE — Progress Notes (Signed)
[facility] Progress Note     Subjective: CC:  Abdominal pain, mostly around drain sites, worse with coughing, unchanged compared to yesterday. Tolerating PO but reports decreased appetite. Had a BM this AM described as loose, not watery. Urinating without issue.   Objective: Vital signs in last 24 hours: Temp:  [97.6 F (36.4 C)-98.7 F (37.1 C)] 98.4 F (36.9 C) (10/30 0751) Pulse Rate:  [81-116] 85 (10/30 0751) Resp:  [18-25] 22 (10/30 0751) BP: (142-158)/(89-102) 149/102 (10/30 0751) SpO2:  [92 %-94 %] 94 % (10/30 0751) Last BM Date: 07/03/17  Intake/Output from previous day: 10/29 0701 - 10/30 0700 In: 1150 [P.O.:1080; IV Piggyback:50] Out: 7672 [Urine:1750; Drains:25] Intake/Output this shift: Total I/O In: -  Out: 200 [Urine:200]  PE: Gen:  Alert, NAD, cooperative Card:  Regular rate and rhythm, pedal pulses 2+ BL Pulm:  Normal effort, clear to auscultation bilaterally  Abd: Soft, obese, mild TTP right hemiabdomen without peritonitis, non-distended, bowel sounds present, JP drains with cloudy, sanguinous output (~25-30 cc/24h) Skin: warm and dry, no rashes  Psych: A&Ox3   Lab Results:   Recent Labs  07/04/17 0505 07/05/17 0605  WBC 22.5* 24.4*  HGB 11.2* 12.4*  HCT 36.0* 39.0  PLT 602* 645*   BMET  Recent Labs  07/03/17 0235 07/05/17 0605  NA 137 137  K 3.9 4.2  CL 102 103  CO2 26 26  GLUCOSE 192* 156*  BUN 5* 8  CREATININE 0.74 0.79  CALCIUM 8.5* 8.7*   PT/INR No results for input(s): LABPROT, INR in the last 72 hours. CMP     Component Value Date/Time   NA 137 07/05/2017 0605   K 4.2 07/05/2017 0605   CL 103 07/05/2017 0605   CO2 26 07/05/2017 0605   GLUCOSE 156 (H) 07/05/2017 0605   BUN 8 07/05/2017 0605   CREATININE 0.79 07/05/2017 0605   CALCIUM 8.7 (L) 07/05/2017 0605   PROT 7.4 07/05/2017 0605   ALBUMIN 1.8 (L) 07/05/2017 0605   AST 19 07/05/2017 0605   ALT 16 (L) 07/05/2017 0605   ALKPHOS 294 (H) 07/05/2017  0605   BILITOT 1.0 07/05/2017 0605   GFRNONAA >60 07/05/2017 0605   GFRAA >60 07/05/2017 0605   Lipase     Component Value Date/Time   LIPASE 20 06/28/2017 1849       Studies/Results: No results found.  Anti-infectives: Anti-infectives    Start     Dose/Rate Route Frequency Ordered Stop   07/05/17 1000  fluconazole (DIFLUCAN) tablet 400 mg     400 mg Oral Daily 07/04/17 1445     07/03/17 1530  fluconazole (DIFLUCAN) tablet 200 mg  Status:  Discontinued     200 mg Oral Daily 07/03/17 1506 07/04/17 1445   06/30/17 2000  cefTRIAXone (ROCEPHIN) 2 g in dextrose 5 % 50 mL IVPB     2 g 100 mL/hr over 30 Minutes Intravenous Every 24 hours 06/30/17 1304     06/30/17 2000  metroNIDAZOLE (FLAGYL) tablet 500 mg     500 mg Oral Every 8 hours 06/30/17 1304     06/30/17 0500  vancomycin (VANCOCIN) 1,500 mg in sodium chloride 0.9 % 500 mL IVPB  Status:  Discontinued     1,500 mg 250 mL/hr over 120 Minutes Intravenous Every 12 hours 06/29/17 1657 06/30/17 1304   06/29/17 1700  vancomycin (VANCOCIN) 2,500 mg in sodium chloride 0.9 % 500 mL IVPB     2,500 mg 250 mL/hr over 120 Minutes Intravenous  Once 06/29/17 1654 06/29/17 1900   06/29/17 0430  piperacillin-tazobactam (ZOSYN) IVPB 3.375 g  Status:  Discontinued     3.375 g 12.5 mL/hr over 240 Minutes Intravenous Every 8 hours 06/29/17 0042 06/30/17 1304   06/29/17 0013  metroNIDAZOLE (FLAGYL) IVPB 500 mg  Status:  Discontinued     500 mg 100 mL/hr over 60 Minutes Intravenous Every 8 hours 06/29/17 0013 06/29/17 1146   06/28/17 2100  metroNIDAZOLE (FLAGYL) IVPB 500 mg     500 mg 100 mL/hr over 60 Minutes Intravenous  Once 06/28/17 2049 06/28/17 2214   06/28/17 2015  piperacillin-tazobactam (ZOSYN) IVPB 3.375 g     3.375 g 100 mL/hr over 30 Minutes Intravenous  Once 06/28/17 2008 06/28/17 2101     Assessment/Plan HTN Diabetes mellitus - A1c 13.4, glucose in the 200s streptococcal bacteremia  Liver abscess Questionable  Cholecystitis - CT: multiple intrahepatic lesions with inflammatory stranding, abscess adjacent to the gallbladder 8x3.5x6 cm approx., recurrent fat containing ventral hernia - S/P perc drain x2 by IR Dr. Pascal Lux 10/25 - appreciate IR assistance. CX growing strep +, candida - WBC trending back up (20 > 22.5 > 24.4 today). HIDA negative/ repeat CT abdomen/pelvis for evaluation of intra-abdominal fluid collections.  - Continue IV abx - appreciate ID assistance.  - no indications for emergent surgical intervention  FEN: NPO  VTE: SCDs, lovenox ID: Zosyn (10/23-10/25), Vanc (10/24-10/25), Flagyl (10/23>>), Ceftriaxone (10/25>>), PO fluconazole 10/28>>  Plan: Continue IV abx. Follow imaging results. Trend CBC.    LOS: 7 days    [facility] 07/05/2017, 10:13 AM Pager: [phone] Consults: [phone] Mon-Fri 7:00 am-4:30 pm Sat-Sun 7:00 am-11:30 am

## 2017-07-06 DIAGNOSIS — E118 Type 2 diabetes mellitus with unspecified complications: Secondary | ICD-10-CM

## 2017-07-06 DIAGNOSIS — D649 Anemia, unspecified: Secondary | ICD-10-CM

## 2017-07-06 DIAGNOSIS — B955 Unspecified streptococcus as the cause of diseases classified elsewhere: Secondary | ICD-10-CM

## 2017-07-06 DIAGNOSIS — D473 Essential (hemorrhagic) thrombocythemia: Secondary | ICD-10-CM

## 2017-07-06 LAB — GLUCOSE, CAPILLARY
GLUCOSE-CAPILLARY: 126 mg/dL — AB (ref 65–99)
GLUCOSE-CAPILLARY: 189 mg/dL — AB (ref 65–99)
GLUCOSE-CAPILLARY: 104 mg/dL — AB (ref 65–99)
Glucose-Capillary: 120 mg/dL — ABNORMAL HIGH (ref 65–99)
Glucose-Capillary: 181 mg/dL — ABNORMAL HIGH (ref 65–99)

## 2017-07-06 LAB — CBC
HCT: 36.1 % — ABNORMAL LOW (ref 39.0–52.0)
Hemoglobin: 11.5 g/dL — ABNORMAL LOW (ref 13.0–17.0)
MCH: 26.1 pg (ref 26.0–34.0)
MCHC: 31.9 g/dL (ref 30.0–36.0)
MCV: 82 fL (ref 78.0–100.0)
PLATELETS: 580 10*3/uL — AB (ref 150–400)
RBC: 4.4 MIL/uL (ref 4.22–5.81)
RDW: 15.3 % (ref 11.5–15.5)
WBC: 20.7 10*3/uL — AB (ref 4.0–10.5)

## 2017-07-06 MED ORDER — INSULIN ASPART PROT & ASPART (70-30 MIX) 100 UNIT/ML ~~LOC~~ SUSP
10.0000 [IU] | Freq: Two times a day (BID) | SUBCUTANEOUS | Status: DC
  Administered 2017-07-06 – 2017-07-13 (×13): 10 [IU] via SUBCUTANEOUS

## 2017-07-06 MED ORDER — INSULIN ASPART 100 UNIT/ML ~~LOC~~ SOLN
5.0000 [IU] | Freq: Three times a day (TID) | SUBCUTANEOUS | Status: DC
  Administered 2017-07-06: 5 [IU] via SUBCUTANEOUS

## 2017-07-06 MED ORDER — INSULIN ASPART PROT & ASPART (70-30 MIX) 100 UNIT/ML ~~LOC~~ SUSP
70.0000 [IU] | Freq: Two times a day (BID) | SUBCUTANEOUS | Status: DC
  Filled 2017-07-06: qty 10

## 2017-07-06 NOTE — Progress Notes (Signed)
1700 Pt self injected insulin several times today- observed good technique - just steered pt away from any scarred areas

## 2017-07-06 NOTE — Progress Notes (Signed)
CC:  Liver abscess  Subjective: He looks good, Drains flushed so the fluid is mostly clear  Objective: Vital signs in last 24 hours: Temp:  [97.5 F (36.4 C)-99 F (37.2 C)] 98.3 F (36.8 C) (10/31 0740) Pulse Rate:  [81-110] 81 (10/31 0740) Resp:  [17-20] 20 (10/31 0740) BP: (115-147)/(72-93) 147/89 (10/31 0740) SpO2:  [93 %-96 %] 93 % (10/31 0740) Last BM Date: 07/06/17 600 PO 110 IV 1875 urine Drain 25 BM x 2 Afebrile, VSS WBC 20.7 - improving Negative HIDA CT 10/30:  Abscesses associated with drainage catheters in right lobe of liver are decreased in size with minimal if any residual fluid. 2. Increased size and organization of 4 subcapsular abscesses in the right inferior lobe of the liver. 3. Increased size of abscess in the gallbladder fossa. Intake/Output from previous day: 10/30 0701 - 10/31 0700 In: 710 [P.O.:600; IV Piggyback:100] Out: 1900 [Urine:1875; Drains:25] Intake/Output this shift: Total I/O In: 15 [I.V.:15] Out: 300 [Urine:300]  General appearance: alert, cooperative and no distress GI: soft, not very tender, drains just flushed.  Lab Results:   Recent Labs  07/05/17 0605 07/06/17 0517  WBC 24.4* 20.7*  HGB 12.4* 11.5*  HCT 39.0 36.1*  PLT 645* 580*    BMET  Recent Labs  07/05/17 0605  NA 137  K 4.2  CL 103  CO2 26  GLUCOSE 156*  BUN 8  CREATININE 0.79  CALCIUM 8.7*   PT/INR No results for input(s): LABPROT, INR in the last 72 hours.   Recent Labs Lab 06/30/17 0709 07/02/17 0522 07/03/17 0235 07/05/17 0605  AST 38 25  --  19  ALT 40 27  --  16*  ALKPHOS 406* 337*  --  294*  BILITOT 1.7* 0.9  --  1.0  PROT 7.5 7.8  --  7.4  ALBUMIN 1.7* 1.6*  1.6* 1.6* 1.8*     Lipase     Component Value Date/Time   LIPASE 20 06/28/2017 1849     Medications: . enoxaparin (LOVENOX) injection  40 mg Subcutaneous Daily  . fluconazole  400 mg Oral Daily  . insulin aspart  0-9 Units Subcutaneous TID WC  . insulin  glargine  20 Units Subcutaneous QHS  . lactobacillus acidophilus  2 tablet Oral TID  . loratadine  10 mg Oral Daily  . pantoprazole  40 mg Oral Daily  . sodium chloride flush  10-40 mL Intracatheter Q12H  . sodium chloride flush  5 mL Intravenous Q8H   Anti-infectives    Start     Dose/Rate Route Frequency Ordered Stop   07/05/17 1200  Ampicillin-Sulbactam (UNASYN) 3 g in sodium chloride 0.9 % 100 mL IVPB     3 g 200 mL/hr over 30 Minutes Intravenous Every 6 hours 07/05/17 1043     07/05/17 1000  fluconazole (DIFLUCAN) tablet 400 mg     400 mg Oral Daily 07/04/17 1445     07/03/17 1530  fluconazole (DIFLUCAN) tablet 200 mg  Status:  Discontinued     200 mg Oral Daily 07/03/17 1506 07/04/17 1445   06/30/17 2000  cefTRIAXone (ROCEPHIN) 2 g in dextrose 5 % 50 mL IVPB  Status:  Discontinued     2 g 100 mL/hr over 30 Minutes Intravenous Every 24 hours 06/30/17 1304 07/05/17 1043   06/30/17 2000  metroNIDAZOLE (FLAGYL) tablet 500 mg  Status:  Discontinued     500 mg Oral Every 8 hours 06/30/17 1304 07/05/17 1043   06/30/17 0500  vancomycin (VANCOCIN) 1,500 mg in sodium chloride 0.9 % 500 mL IVPB  Status:  Discontinued     1,500 mg 250 mL/hr over 120 Minutes Intravenous Every 12 hours 06/29/17 1657 06/30/17 1304   06/29/17 1700  vancomycin (VANCOCIN) 2,500 mg in sodium chloride 0.9 % 500 mL IVPB     2,500 mg 250 mL/hr over 120 Minutes Intravenous  Once 06/29/17 1654 06/29/17 1900   06/29/17 0430  piperacillin-tazobactam (ZOSYN) IVPB 3.375 g  Status:  Discontinued     3.375 g 12.5 mL/hr over 240 Minutes Intravenous Every 8 hours 06/29/17 0042 06/30/17 1304   06/29/17 0013  metroNIDAZOLE (FLAGYL) IVPB 500 mg  Status:  Discontinued     500 mg 100 mL/hr over 60 Minutes Intravenous Every 8 hours 06/29/17 0013 06/29/17 1146   06/28/17 2100  metroNIDAZOLE (FLAGYL) IVPB 500 mg     500 mg 100 mL/hr over 60 Minutes Intravenous  Once 06/28/17 2049 06/28/17 2214   06/28/17 2015   piperacillin-tazobactam (ZOSYN) IVPB 3.375 g     3.375 g 100 mL/hr over 30 Minutes Intravenous  Once 06/28/17 2008 06/28/17 2101     . ampicillin-sulbactam (UNASYN) IV 3 g (07/06/17 0521)     Assessment/Plan Liver abscess - IR drain x 2 06/30/17 Questionable Cholecystitis - Negative HIDA streptococcal bacteremia -  IV Unasyn/ID following  Diabetes mellitus - A1c 13.4, glucose in the 200s HTN - norvasc Body mass index is 58.8  FEN: No IV fluids/Carb Mod VTE: SCDs, lovenox ID: Zosyn (10/23-10/25), Vanc (10/24-10/25), Flagyl (10/23>>), Ceftriaxone (10/25-10/30), PO fluconazole 10/28 =>> day 4, Unasyn IV 10/30 =>> day 2   Plan:  Ongoing conservative management, we will follow with you  LOS: 8 days    Malala Trenkamp 07/06/2017 [phone]

## 2017-07-06 NOTE — Progress Notes (Signed)
Met with patient this afternoon to discuss plans to start insulin for home.  Patient stated he has been practicing giving insulin with RN here in hospital.    Discussed with patient 70/30 insulin and how it works, when to take, the importance of eating three meals a day.  Discussed with patient that he can go to [place] and purchase the insulin for $25 per vial along with low cost insulin syringes.  Will have MD give pt Rx for insulin and Rxs at time of d/c.  Also encouraged pt to purchase a CBG meter OTC at [facility] as well.  Meter is $9 and box of 100 strips is $18.  Patient can also purchase a large box of lancets for low cost as well.  Instructed pt to get all his diabetes meds and supplies at [facility].  Patient stated his brother and sister will be able to help him with medication administration at home.  Discussed the importance of rotation of injection sites and to use abdomen or the back of the arm only.  Encouraged pt to check his CBGs at least BID at home (before breakfast and dinner) and to record all CBG results so the health care provider at the [facility] clinic can help him adjust his insulin as needed.  Reviewed CBG goals at home and also discussed signs and symptoms of high and low CBGs and how to treat both.  RD to visit with patient today to discuss healthy eating at home.  RNs to continue to work with patient on learning insulin admin further.     --Will follow patient during hospitalization--  Wyn Quaker RN, MSN, CDE Diabetes Coordinator Inpatient Glycemic Control Team Team Pager: [phone] (8a-5p)

## 2017-07-06 NOTE — Progress Notes (Signed)
Inpatient Diabetes Program Recommendations  AACE/ADA: New Consensus Statement on Inpatient Glycemic Control (2015)  Target Ranges:  Prepandial:   less than 140 mg/dL      Peak postprandial:   less than 180 mg/dL (1-2 hours)      Critically ill patients:  140 - 180 mg/dL   Results for [name], [name] (MRN [id number]) as of 07/06/2017 09:47  Ref. Range 06/28/2017 12:32  Hemoglobin A1C Latest Ref Range: 4.8 - 5.6 % 13.4 (H)   Results for [name], [name] (MRN [id number]) as of 07/06/2017 09:47  Ref. Range 07/05/2017 07:46 07/05/2017 11:46 07/05/2017 16:02 07/05/2017 20:05  Glucose-Capillary Latest Ref Range: 65 - 99 mg/dL 166 (H) 180 (H) 149 (H) 157 (H)    Home DM Meds: Metformin 1000 mg BID  Current Insulin Orders: Lantus 20 units QHS      Novolog Sensitive Correction Scale/ SSI (0-9 units) TID AC        MD- Note plans to send patient home on insulin when patient ready for discharge.  Patient currently uninsured and will likely not be able to afford Lantus and Novolog.    Please consider converting patient to 70/30 insulin prior to discharge to assess how much 70/30 insulin patient will need at home.  Patient can purchase 70/30 insulin at [facility] for $25 per vial out of pocket.  Patient can also purchase insulin syringes at [facility] for low cost as well.  Please consider the following:  1. Start 70/30 Insulin tonight with supper- Recommend 70/30 Insulin- 12 units BID with meals   2. If converting to 70/30 Insulin, please stop Lantus.       --Will follow patient during hospitalization--  Wyn Quaker RN, MSN, CDE Diabetes Coordinator Inpatient Glycemic Control Team Team Pager: [phone] (8a-5p)

## 2017-07-06 NOTE — Care Management Note (Addendum)
Case Management Note  Patient Details  Name: [name] MRN: [id number] Date of Birth: [birth year]  Subjective/Objective:    From home with brother, [name].  PTA indep,  Patient states he has a follow up apt with [facility]  [phone] ( Dr. [name] PA) next week and he will be going  There for medication ast.  Depending on when he is discharged, if over the weekend he may need ast with [facility] because clinic will not be open.  He states he has transport by his cousin at discharge.  He presents with Hepatic abscess and liver abscess, he is for percutaneous drain by IR tomorrow.  Patient states he may need a cane at discharge.      10/31 Corinne, BSN- per ID patient will need at least 3 weeks of iv abx, they are still trying to decide which abx he will need.  He will have his sister Gretchen Portela [phone]  [phone] and his brother at home to assist him with the iv abx.  Referral made to [facility] for iv abx.  He also has two drains in right now , which will probably remain in at discharge.                              Action/Plan: NCM will follow for dc needs for HH IV ABX and drain care.  Expected Discharge Date:                  Expected Discharge Plan:  [place]  In-House Referral:     Discharge planning Services  CM Consult  Post Acute Care Choice:  Home Health Choice offered to:  Patient  DME Arranged:    DME Agency:     HH Arranged:  RN [place] Agency:  [place]  Status of Service:  In process, will continue to follow  If discussed at Long Length of Stay Meetings, dates discussed:    Additional Comments:  Zenon Mayo, RN 07/06/2017, 1:00 PM

## 2017-07-06 NOTE — Progress Notes (Signed)
Nutrition Education Note  Pt reports having received DM education before and that he knows he should be watching his carbohydrate intake, avoiding sugar, and eating more fresh fruits and vegetables.  Educated pt on why he should monitor his carbohydrate intake and the importance of being mindful of carb servings for each meal (3-4) and snack (1-2). Discussed foods that are high in carbohydrates and should be limited. Educated pt on reading nutrition labels.   Pt typically eats 2 [employer] biscuits for breakfast and grabs fast food for both lunch and dinner - generally burger and fries. He says that he knows how to cook and will start cooking more of his own meals upon discharge. Encouraged pt to choose healthier meals in the drive-through such as salads with grilled chicken or grilled chicken wraps when he eats out.  Discussed and provided pt with Nutrition Care Manual handouts, "Carbohdyrate Counting for People with Diabetes" and "Diabetes Label Reading Tips". Expect fair compliance. Pt plans to cook more, eat less fried foods, and monitor his carbohydrate intake.  Chart reviewed. Labs include CBGs: [facility] Intern Pager: [phone] 07/06/2017 3:08 PM

## 2017-07-06 NOTE — Plan of Care (Signed)
Problem: Nutritional: Goal: Maintenance of adequate nutrition will improve Outcome: Completed/Met Date Met: 07/06/17 Helping pt to make good choices - asking for real soda - talked to pt about diet soda or water and why real soda is not the best choice for a diabetic

## 2017-07-06 NOTE — Progress Notes (Signed)
Rehab admissions - I met with patient.  He plans for his brother to help after discharge.  His sister is in a wheel chair and has an aide who can help the patient as well.  Patient is at supervision level ambulating 170 feet.  He no longer meets criteria for an acute inpatient rehab admission.  Recommend home with [facility] follow up.  Call me for questions.  #[phone]

## 2017-07-06 NOTE — Progress Notes (Addendum)
[facility] for Infectious Disease    Date of Admission:  06/28/2017   Total days of antibiotics 9        Day 4 fluc        Day 2 amp/sub          ID: [name] is a 51 y.o. male with polymicrobial hepatic abscess plus strep bacteremia Active Problems:   Hepatic abscess   Abscess, hepatic   Acute cholecystitis   Type 2 diabetes mellitus without complication, without long-term current use of insulin (HCC)   Bacteremia due to Gram-positive bacteria   Diarrhea   Diastolic dysfunction   Bacteremia   History of gout   Benign essential HTN   Post-operative pain   Acute blood loss anemia   Leukocytosis    Subjective:  had repeat abd CT which showed that the drained placed abscess had improved in size, however newer collections have evolved, including 1 large abscess measuring 8.8 x 4.9cm within GB fossa. Also has smaller ranging from 1.8- 4.5 cm liver abscess within the dome of liver/along capsule  IR felt they are unable to reach larger fluid collection and surgery recommended medical management  He remains afebrile, still having 15 mL of sanguinous purulent fluid in bulb  Medications:  . enoxaparin (LOVENOX) injection  40 mg Subcutaneous Daily  . fluconazole  400 mg Oral Daily  . insulin aspart  0-9 Units Subcutaneous TID WC  . insulin glargine  20 Units Subcutaneous QHS  . lactobacillus acidophilus  2 tablet Oral TID  . loratadine  10 mg Oral Daily  . pantoprazole  40 mg Oral Daily  . sodium chloride flush  10-40 mL Intracatheter Q12H  . sodium chloride flush  5 mL Intravenous Q8H    Objective: Vital signs in last 24 hours: Temp:  [97.5 F (36.4 C)-99 F (37.2 C)] 98.3 F (36.8 C) (10/31 1206) Pulse Rate:  [79-110] 85 (10/31 1206) Resp:  [15-21] 19 (10/31 1206) BP: (110-150)/(81-95) 110/87 (10/31 1206) SpO2:  [93 %-98 %] 96 % (10/31 1206)   Lab Results  Recent Labs  07/05/17 0605 07/06/17 0517  WBC 24.4* 20.7*  HGB 12.4* 11.5*  HCT 39.0 36.1*    NA 137  --   K 4.2  --   CL 103  --   CO2 26  --   BUN 8  --   CREATININE 0.79  --    Liver Panel  Recent Labs  07/05/17 0605  PROT 7.4  ALBUMIN 1.8*  AST 19  ALT 16*  ALKPHOS 294*  BILITOT 1.0    Microbiology: 10/30 blood cx pending 10/24 blood cx ngtd 10/25 hepatic abscess strep group c and group f plus c.albicans 10/23 blood cx strep group F  Studies/Results: Ct Abdomen Pelvis W Contrast  Result Date: 07/05/2017 CLINICAL DATA:  51 y/o M; abdominal infections with worsening leukocytosis. Image guided drainage with catheter 5 days ago. EXAM: CT ABDOMEN AND PELVIS WITH CONTRAST TECHNIQUE: Multidetector CT imaging of the abdomen and pelvis was performed using the standard protocol following bolus administration of intravenous contrast. CONTRAST:  100 cc Isovue-300 COMPARISON:  None. FINDINGS: Lower chest: No acute abnormality. Hepatobiliary: 2 percutaneous drainage catheters are present within abscess in the right lobe of the liver. The abscess with catheters are decreased in size from the prior CT with minimal if any residual fluid collection. Irregular abscess with multiple small satellite foci within the dome of the liver spanning approximately 4.3 cm is stable from  prior CT. Multiple abscesses within the right inferior lobe of the liver along the capsule are decreased in attenuation and increased in size indicating increased organization and fluid components measuring 1.8, 1.9, 3.4, and 4.5 cm (series 3, image 41, 43, 48). Abscess within the gallbladder fossa is increased in size measuring 8.8 x 4.9 cm in axial plane (AP by ML series 3, image 34). The gallbladder is compressed by the gallbladder fossa abscess. Pancreas: Unremarkable. No pancreatic ductal dilatation or surrounding inflammatory changes. Spleen: Normal in size without focal abnormality. Adrenals/Urinary Tract: Adrenal glands are unremarkable. Kidneys are normal, without renal calculi, focal lesion, or hydronephrosis.  Bladder is unremarkable. Stomach/Bowel: Stomach is within normal limits. Appendix appears normal. No evidence of bowel wall thickening, distention, or inflammatory changes. Vascular/Lymphatic: No significant vascular findings are present. No enlarged abdominal or pelvic lymph nodes. Reproductive: Prostate is unremarkable. Other: Stable paraumbilical hernia containing fat. Musculoskeletal: No fracture is seen. IMPRESSION: 1. Abscesses associated with drainage catheters in right lobe of liver are decreased in size with minimal if any residual fluid. 2. Increased size and organization of 4 subcapsular abscesses in the right inferior lobe of the liver. 3. Increased size of abscess in the gallbladder fossa. Electronically Signed   By: Kristine Garbe M.D.   On: 07/05/2017 21:07   Nm Hepato W/eject Fract  Result Date: 07/05/2017 CLINICAL DATA:  Abdominal pain. EXAM: NUCLEAR MEDICINE HEPATOBILIARY IMAGING WITH GALLBLADDER EF TECHNIQUE: Sequential images of the abdomen were obtained out to 60 minutes following intravenous administration of radiopharmaceutical. After oral ingestion of Ensure, gallbladder ejection fraction was determined. At 60 min, normal ejection fraction is greater than 33%. RADIOPHARMACEUTICALS:  5.0 mCi Tc-39m  Choletec IV COMPARISON:  CT 06/30/2017. FINDINGS: Prompt uptake and biliary excretion of activity by the liver is seen. Small photopenic area in the right lobe liver most likely related to a previously identified abscess. Gallbladder activity is visualized, consistent with patency of cystic duct. Biliary activity passes into small bowel, consistent with patent common bile duct. Calculated gallbladder ejection fraction is 61%. (Normal gallbladder ejection fraction with Ensure is greater than 33%.) IMPRESSION: Normal visualization of the gallbladder. Normal gallbladder ejection fraction. Electronically Signed   By: Marcello Moores  Register   On: 07/05/2017 13:59      Assessment/Plan: Hepatic abscess with secondary streptococcal bacteremia = leukocytosis slightly improved with also change to amp/sub. Repeat imaging suggests still large fluid collection unfotunately, the location is not amenable to drainage. Will plan on prolonged IV abtx course.   Strep group F has decreased susceptibility to CTX so amp/sub or penicillin would be drug of choice  Recommend to do 4 wk of amp/sub plus oral fluconazole 400mg  po daily and will plan on repeat CT scan at that time.  Defer pulling drains to IR  Strep bacteremia = resolved. TTE negative. Repeat at 24hrs showed NGTD, likely transient  Will arrange for follow up in the ID clinic  Will sign off    [facility] for Infectious Diseases Cell: [phone] Pager: [phone]  07/06/2017, 12:58 PM

## 2017-07-06 NOTE — Progress Notes (Signed)
PROGRESS NOTE    [name]  IOE:[id number] DOB: [birth year] DOA: 06/28/2017 PCP: [name], PA-C   Brief Narrative:  51 y.o. BM PMHx  Diabetes mellitus uncontrolled with complication, HTN,Gout,   Referred to ER from PCP clinic. He was seen as a new patient in the clinic following up from ER visit on 06/10/2017. At that time, he was diagnosed with upper respiratory infection and started on doxycycline. Patient symptoms improved somewhat after which started again worsening. Patient was complaining of fatigue, chills, sweating. Patient also reports 30 pound unintentional weight loss over last several weeks to months. Patient complained of abdominal pain. He denies chest pain,  no shortness of breath. No nausea vomiting or diarrhea. No history of stroke or TIA.   In the ED, CT scan of the abdomen and pelvis was done which showed multiple intrahepaticlesions, likely liver abscesses. Also showed fluid collection around the gallbladder, with thickening of the gallbladder wall concerning for cholecystitis. Gen. Surgery at [facility] was consulted by ED physician, Dr Molli Posey recommended that patient be transferred to [facility].    Subjective: 10/31 A/O 4, CP negative, negative SOB, abdominal pain improved. Patient states noncompliant with his home medication for diabetes (metformin).    Assessment & Plan:   Active Problems:   Hepatic abscess   Abscess, hepatic   Acute cholecystitis   Type 2 diabetes mellitus without complication, without long-term current use of insulin (HCC)   Bacteremia due to Gram-positive bacteria   Diarrhea   Diastolic dysfunction   Bacteremia   History of gout   Benign essential HTN   Post-operative pain   Acute blood loss anemia   Leukocytosis   Intrahepatic Abscesses -CT abdomen/pelvis showed multiple intrahepatic lesions w/ perihepatic inflammatory stranding -Blood culture (10/23) - gram positive cocci, resistant to clindamycin.  IV  Unasyn started per ID -Percutaneous drains (2) placed on 10/25 by IR. Continue for next 1-2 days and then hopefully can be removed per IR.   -Drain culture positive for candida albicans, PO fluconazole initiated 10/28 -Hepatic panel (10/23) negative -Continue IV abx and monitoring LFTs -10/30 Repeat abdominal CT: Showed additional abscesses not amenable to drainage per IR  -Per ID continue antibiotics for additional 4 weeks -Schedule follow up appointment with Dr. Coralie Keens General Surgery in 1-2 weeks evaluate for removal of right hepatic lobe drainage catheters. Possible surgical intervention or new hepatic abscess.  Suspected acute cholecystitis on CT -Per general surgery today, if gallbladder remains potential issue in setting of intrahepatic abscesses s/p perc drain placement, and leukocytosis, HIDA scan may be ordered. -HIDA scan on 10/30; WNL see results below.      Group F streptococcal bacteremia -Blood culture (10/23) growing streptococcus group F.   - TTE (10/26) - LV EF 60-65%, no emboli -C diff negative.  -WBC 24.4 today.  -Continue IV abx, monitor -Schedule follow-up appointment with Dr. [name] ID in 1-2 weeks evaluate for bacteremia, hepatic abscess   Diabetes Type 2 uncontrolled with complication  -18/29 Hemoglobin A1c= 13.4  -10/31 Discontinue Lantus secondary to patient being unable to pay for medication. -10/31 start NovoLog 70/30, 10 units BID -10/31 start NovoLog 5 units QAC -Sensitive SSI -Patient evaluated by diabetic coordinator -10/31 consulted diabetic nutritionist -Schedule follow-up with PA [name] in 3-4 weeks uncontrolled diabetes, thrombocytosis, bacteremia   Normocytic anemia of unknown etiology -Iron profile (10/25) showed low iron and TIBC, increased ferritin.  Iron deficient - question of nutritional vs. infectious status.  Avoiding supplemental iron  in setting of bacteremia.     Thrombocytosis in the setting of hepatic  infection -Platelets stable.  -.  Etiologies include infection, anemia, inflammation. -Continue Lovenox for DVT prophylaxis -Continue to monitor    DVT prophylaxis: Lovenox Code Status: Full Family Communication: None Disposition Plan: CIR vs SNF   Consultants:  General surgery IR ID   Procedures/Significant Events:  10/25 CT guided percutaneous catheter placement 2 10/28 PICC, right cephalic  62/22 NM HEPATOBILIARY IMAGING WITH GALLBLADDER:Normal visualization of the gallbladder. Normal gallbladder ejection fraction. 10/30 CT abdomen pelvis W contrast:-Abscesses associated with drainage catheters in right lobe of liver are decreased in size with minimal if any residual fluid. 2. Increased size and organization of 4 subcapsular abscesses in the right inferior lobe of the liver. 3. Increased size of abscess in the gallbladder fossa.     I have personally reviewed and interpreted all radiology studies and my findings are as above.  VENTILATOR SETTINGS:    Cultures   Antimicrobials: Anti-infectives    Start     Stop   07/05/17 1200  Ampicillin-Sulbactam (UNASYN) 3 g in sodium chloride 0.9 % 100 mL IVPB         07/05/17 1000  fluconazole (DIFLUCAN) tablet 400 mg         07/03/17 1530  fluconazole (DIFLUCAN) tablet 200 mg  Status:  Discontinued     07/04/17 1445   06/30/17 2000  cefTRIAXone (ROCEPHIN) 2 g in dextrose 5 % 50 mL IVPB  Status:  Discontinued     07/05/17 1043   06/30/17 2000  metroNIDAZOLE (FLAGYL) tablet 500 mg  Status:  Discontinued     07/05/17 1043   06/30/17 0500  vancomycin (VANCOCIN) 1,500 mg in sodium chloride 0.9 % 500 mL IVPB  Status:  Discontinued     06/30/17 1304   06/29/17 1700  vancomycin (VANCOCIN) 2,500 mg in sodium chloride 0.9 % 500 mL IVPB     06/29/17 1900   06/29/17 0430  piperacillin-tazobactam (ZOSYN) IVPB 3.375 g  Status:  Discontinued     06/30/17 1304   06/29/17 0013  metroNIDAZOLE (FLAGYL) IVPB 500 mg  Status:   Discontinued     06/29/17 1146   06/28/17 2100  metroNIDAZOLE (FLAGYL) IVPB 500 mg     06/28/17 2214   06/28/17 2015  piperacillin-tazobactam (ZOSYN) IVPB 3.375 g     06/28/17 2101       Devices   LINES / TUBES:  RIGHT cephalic PICC 97/98>>    Continuous Infusions: . ampicillin-sulbactam (UNASYN) IV 3 g (07/06/17 0521)     Objective: Vitals:   07/05/17 2003 07/05/17 2319 07/06/17 0313 07/06/17 0740  BP: (!) 144/93 (!) 145/83 (!) 143/93 (!) 147/89  Pulse: (!) 110 (!) 101 (!) 109 81  Resp: 18 17 17 20   Temp: 99 F (37.2 C) 98.4 F (36.9 C) 98.4 F (36.9 C) 98.3 F (36.8 C)  TempSrc: Oral Oral Oral Oral  SpO2: 95% 96% 96% 93%  Weight:      Height:        Intake/Output Summary (Last 24 hours) at 07/06/17 0838 Last data filed at 07/06/17 0600  Gross per 24 hour  Intake              700 ml  Output             1700 ml  Net            -1000 ml   Filed Weights   06/28/17 1535 06/29/17  0403  Weight: (!) 375 lb (170.1 kg) (!) 375 lb 12.8 oz (170.5 kg)    Examination:  General: A/O 4, No acute respiratory distress Neck:  Negative scars, masses, torticollis, lymphadenopathy, JVD Lungs: Clear to auscultation bilaterally without wheezes or crackles Cardiovascular: Regular rate and rhythm without murmur gallop or rub normal S1 and S2 Abdomen: MORBIDLY OBESE, positive abdominal pain to palpation RUQ, nondistended, positive soft, bowel sounds, no rebound, no ascites, no appreciable mass, Extremities: No significant cyanosis, clubbing. Does not appear to have bilateral pedal edema (morbidly obese) Skin: Negative rashes, lesions, ulcers Psychiatric:  Negative depression, negative anxiety, negative fatigue, negative mania, EXTREMELY POOR understanding of his health issues.  Central nervous system:  Cranial nerves II through XII intact, tongue/uvula midline, all extremities muscle strength 5/5, sensation intact throughout, negative dysarthria, negative expressive aphasia,  negative receptive aphasia.  .     Data Reviewed: Care during the described time interval was provided by me .  I have reviewed this patient's available data, including medical history, events of note, physical examination, and all test results as part of my evaluation.   CBC:  Recent Labs Lab 07/02/17 0522 07/03/17 0235 07/04/17 0505 07/05/17 0605 07/06/17 0517  WBC 28.3* 20.9* 22.5* 24.4* 20.7*  NEUTROABS  --   --   --  18.1*  --   HGB 11.4* 11.6* 11.2* 12.4* 11.5*  HCT 35.3* 36.5* 36.0* 39.0 36.1*  MCV 81.1 82.2 82.0 81.9 82.0  PLT 520* 557* 602* 645* 462*   Basic Metabolic Panel:  Recent Labs Lab 06/30/17 0709 07/01/17 0318 07/02/17 0522 07/03/17 0235 07/05/17 0605  NA 133* 135 135 137 137  K 3.7 3.7 3.5 3.9 4.2  CL 96* 101 101 102 103  CO2 27 23 23 26 26   GLUCOSE 219* 232* 221* 192* 156*  BUN <5* 8 7 5* 8  CREATININE 0.80 0.86 0.71 0.74 0.79  CALCIUM 8.2* 8.1* 8.4* 8.5* 8.7*  MG  --  1.9  --   --  2.1  PHOS  --   --  3.5 4.4 4.0   GFR: Estimated Creatinine Clearance: 164.8 mL/min (by C-G formula based on SCr of 0.79 mg/dL). Liver Function Tests:  Recent Labs Lab 06/30/17 0709 07/02/17 0522 07/03/17 0235 07/05/17 0605  AST 38 25  --  19  ALT 40 27  --  16*  ALKPHOS 406* 337*  --  294*  BILITOT 1.7* 0.9  --  1.0  PROT 7.5 7.8  --  7.4  ALBUMIN 1.7* 1.6*  1.6* 1.6* 1.8*   No results for input(s): LIPASE, AMYLASE in the last 168 hours. No results for input(s): AMMONIA in the last 168 hours. Coagulation Profile:  Recent Labs Lab 06/30/17 0709  INR 1.24   Cardiac Enzymes: No results for input(s): CKTOTAL, CKMB, CKMBINDEX, TROPONINI in the last 168 hours. BNP (last 3 results) No results for input(s): PROBNP in the last 8760 hours. HbA1C: No results for input(s): HGBA1C in the last 72 hours. CBG:  Recent Labs Lab 07/05/17 1602 07/05/17 2005 07/05/17 2323 07/06/17 0316 07/06/17 0743  GLUCAP 149* 157* 148* 120* 126*   Lipid  Profile: No results for input(s): CHOL, HDL, LDLCALC, TRIG, CHOLHDL, LDLDIRECT in the last 72 hours. Thyroid Function Tests: No results for input(s): TSH, T4TOTAL, FREET4, T3FREE, THYROIDAB in the last 72 hours. Anemia Panel: No results for input(s): VITAMINB12, FOLATE, FERRITIN, TIBC, IRON, RETICCTPCT in the last 72 hours. Urine analysis:    Component Value Date/Time   COLORURINE AMBER (  A) 06/28/2017 1828   APPEARANCEUR CLEAR 06/28/2017 1828   LABSPEC 1.040 (H) 06/28/2017 1828   PHURINE 6.0 06/28/2017 1828   GLUCOSEU 50 (A) 06/28/2017 1828   HGBUR NEGATIVE 06/28/2017 1828   BILIRUBINUR SMALL (A) 06/28/2017 1828   BILIRUBINUR LARGE 06/28/2017 1155   KETONESUR NEGATIVE 06/28/2017 1828   PROTEINUR 30 (A) 06/28/2017 1828   UROBILINOGEN 2.0 (A) 06/28/2017 1155   NITRITE NEGATIVE 06/28/2017 1828   LEUKOCYTESUR NEGATIVE 06/28/2017 1828   Sepsis Labs: @LABRCNTIP (procalcitonin:4,lacticidven:4)  ) Recent Results (from the past 240 hour(s))  Urine culture     Status: None   Collection Time: 06/28/17  6:28 PM  Result Value Ref Range Status   Specimen Description URINE, RANDOM  Final   Special Requests NONE  Final   Culture   Final    NO GROWTH Performed at [facility], [place] [address], [place], [place] [address]    Report Status 06/30/2017 FINAL  Final  Blood culture (routine x 2)     Status: Abnormal   Collection Time: 06/28/17  6:49 PM  Result Value Ref Range Status   Specimen Description RIGHT ANTECUBITAL  Final   Special Requests   Final    BOTTLES DRAWN AEROBIC AND ANAEROBIC Blood Culture adequate volume   Culture  Setup Time   Final    GRAM POSITIVE COCCI Gram Stain Report Called to,Read Back By and Verified With: ANGELICA DAVIS. @ 9371 ON [id number] BY HENDERSON L. ANAEROBIC AEROBIC Gram Stain Report Called to,Read Back By and Verified With: MIZE,J @ 1930 ON 10.24.18 BY BOWMAN,L CRITICAL RESULT CALLED TO, READ BACK BY AND VERIFIED WITH: J.LEDFORD PHARMD 06/30/17 0207  L.CHAMPION Performed at [facility], [place] [address], [place], [place] [address]    Culture STREPTOCOCCUS GROUP F (A)  Final   Report Status 07/05/2017 FINAL  Final   Organism ID, Bacteria STREPTOCOCCUS GROUP F  Final      Susceptibility   Streptococcus group f - MIC*    CLINDAMYCIN RESISTANT Resistant     AMPICILLIN <=0.25 SENSITIVE Sensitive     VANCOMYCIN 0.5 SENSITIVE Sensitive     LEVOFLOXACIN <=0.25 SENSITIVE Sensitive     PENICILLIN Value in next row Sensitive      SENSITIVE0.12    * STREPTOCOCCUS GROUP F  Blood culture (routine x 2)     Status: Abnormal   Collection Time: 06/28/17  6:49 PM  Result Value Ref Range Status   Specimen Description BLOOD RIGHT HAND  Final   Special Requests   Final    BOTTLES DRAWN AEROBIC AND ANAEROBIC Blood Culture adequate volume   Culture  Setup Time   Final    GRAM POSITIVE COCCI Gram Stain Report Called to,Read Back By and Verified With: ANGELICA DAVIS @ 1025 ON [id number] BY HENDERSON L. ANAEROBIC AEROBIC Gram Stain Report Called to,Read Back By and Verified With: MIZE, J @ 1930 ON 10.24.18 BY BOWMAN,L    Culture (A)  Final    STREPTOCOCCUS GROUP F SUSCEPTIBILITIES PERFORMED ON PREVIOUS CULTURE WITHIN THE LAST 5 DAYS. Performed at [facility], [place] [address], [place], [place] [address]    Report Status 07/03/2017 FINAL  Final  Blood Culture ID Panel (Reflexed)     Status: Abnormal   Collection Time: 06/28/17  6:49 PM  Result Value Ref Range Status   Enterococcus species NOT DETECTED NOT DETECTED Final   Listeria monocytogenes NOT DETECTED NOT DETECTED Final   Staphylococcus species NOT DETECTED NOT DETECTED Final  Staphylococcus aureus NOT DETECTED NOT DETECTED Final   Streptococcus species DETECTED (A) NOT DETECTED Final    Comment: Not Enterococcus species, Streptococcus agalactiae, Streptococcus pyogenes, or Streptococcus pneumoniae. CRITICAL RESULT CALLED TO, READ BACK BY AND VERIFIED WITH: J.LEDFORD PHARMD 06/30/17  0207 L.CHAMPION    Streptococcus agalactiae NOT DETECTED NOT DETECTED Final   Streptococcus pneumoniae NOT DETECTED NOT DETECTED Final   Streptococcus pyogenes NOT DETECTED NOT DETECTED Final   Acinetobacter baumannii NOT DETECTED NOT DETECTED Final   Enterobacteriaceae species NOT DETECTED NOT DETECTED Final   Enterobacter cloacae complex NOT DETECTED NOT DETECTED Final   Escherichia coli NOT DETECTED NOT DETECTED Final   Klebsiella oxytoca NOT DETECTED NOT DETECTED Final   Klebsiella pneumoniae NOT DETECTED NOT DETECTED Final   Proteus species NOT DETECTED NOT DETECTED Final   Serratia marcescens NOT DETECTED NOT DETECTED Final   Haemophilus influenzae NOT DETECTED NOT DETECTED Final   Neisseria meningitidis NOT DETECTED NOT DETECTED Final   Pseudomonas aeruginosa NOT DETECTED NOT DETECTED Final   Candida albicans NOT DETECTED NOT DETECTED Final   Candida glabrata NOT DETECTED NOT DETECTED Final   Candida krusei NOT DETECTED NOT DETECTED Final   Candida parapsilosis NOT DETECTED NOT DETECTED Final   Candida tropicalis NOT DETECTED NOT DETECTED Final    Comment: Performed at [facility], [place] [address], [place], [place] [address]  MRSA PCR Screening     Status: None   Collection Time: 06/29/17  4:12 AM  Result Value Ref Range Status   MRSA by PCR NEGATIVE NEGATIVE Final    Comment:        The GeneXpert MRSA Assay (FDA approved for NASAL specimens only), is one component of a comprehensive MRSA colonization surveillance program. It is not intended to diagnose MRSA infection nor to guide or monitor treatment for MRSA infections.   Culture, blood (routine x 2)     Status: None   Collection Time: 06/29/17  6:58 PM  Result Value Ref Range Status   Specimen Description BLOOD RIGHT HAND  Final   Special Requests   Final    BOTTLES DRAWN AEROBIC ONLY Blood Culture adequate volume   Culture NO GROWTH 5 DAYS  Final   Report Status 07/04/2017 FINAL  Final  Culture, blood  (routine x 2)     Status: None   Collection Time: 06/29/17  7:09 PM  Result Value Ref Range Status   Specimen Description BLOOD RIGHT ANTECUBITAL  Final   Special Requests   Final    BOTTLES DRAWN AEROBIC AND ANAEROBIC Blood Culture adequate volume   Culture NO GROWTH 5 DAYS  Final   Report Status 07/04/2017 FINAL  Final  Aerobic/Anaerobic Culture (surgical/deep wound)     Status: None   Collection Time: 06/30/17  1:31 PM  Result Value Ref Range Status   Specimen Description ABSCESS  Final   Special Requests HEPATIC ABSCESS DRAINAGE  Final   Gram Stain   Final    ABUNDANT WBC PRESENT, PREDOMINANTLY PMN ABUNDANT GRAM POSITIVE COCCI ABUNDANT GRAM POSITIVE RODS    Culture   Final    FEW CANDIDA ALBICANS MODERATE STREPTOCOCCUS GROUP C MODERATE STREPTOCOCCUS GROUP F NO ANAEROBES ISOLATED    Report Status 07/05/2017 FINAL  Final  C difficile quick scan w PCR reflex     Status: None   Collection Time: 07/01/17  8:00 PM  Result Value Ref Range Status   C Diff antigen NEGATIVE NEGATIVE Final   C Diff toxin NEGATIVE NEGATIVE Final  C Diff interpretation No C. difficile detected.  Final         Radiology Studies: Ct Abdomen Pelvis W Contrast  Result Date: 07/05/2017 CLINICAL DATA:  51 y/o M; abdominal infections with worsening leukocytosis. Image guided drainage with catheter 5 days ago. EXAM: CT ABDOMEN AND PELVIS WITH CONTRAST TECHNIQUE: Multidetector CT imaging of the abdomen and pelvis was performed using the standard protocol following bolus administration of intravenous contrast. CONTRAST:  100 cc Isovue-300 COMPARISON:  None. FINDINGS: Lower chest: No acute abnormality. Hepatobiliary: 2 percutaneous drainage catheters are present within abscess in the right lobe of the liver. The abscess with catheters are decreased in size from the prior CT with minimal if any residual fluid collection. Irregular abscess with multiple small satellite foci within the dome of the liver spanning  approximately 4.3 cm is stable from prior CT. Multiple abscesses within the right inferior lobe of the liver along the capsule are decreased in attenuation and increased in size indicating increased organization and fluid components measuring 1.8, 1.9, 3.4, and 4.5 cm (series 3, image 41, 43, 48). Abscess within the gallbladder fossa is increased in size measuring 8.8 x 4.9 cm in axial plane (AP by ML series 3, image 34). The gallbladder is compressed by the gallbladder fossa abscess. Pancreas: Unremarkable. No pancreatic ductal dilatation or surrounding inflammatory changes. Spleen: Normal in size without focal abnormality. Adrenals/Urinary Tract: Adrenal glands are unremarkable. Kidneys are normal, without renal calculi, focal lesion, or hydronephrosis. Bladder is unremarkable. Stomach/Bowel: Stomach is within normal limits. Appendix appears normal. No evidence of bowel wall thickening, distention, or inflammatory changes. Vascular/Lymphatic: No significant vascular findings are present. No enlarged abdominal or pelvic lymph nodes. Reproductive: Prostate is unremarkable. Other: Stable paraumbilical hernia containing fat. Musculoskeletal: No fracture is seen. IMPRESSION: 1. Abscesses associated with drainage catheters in right lobe of liver are decreased in size with minimal if any residual fluid. 2. Increased size and organization of 4 subcapsular abscesses in the right inferior lobe of the liver. 3. Increased size of abscess in the gallbladder fossa. Electronically Signed   By: Kristine Garbe M.D.   On: 07/05/2017 21:07   Nm Hepato W/eject Fract  Result Date: 07/05/2017 CLINICAL DATA:  Abdominal pain. EXAM: NUCLEAR MEDICINE HEPATOBILIARY IMAGING WITH GALLBLADDER EF TECHNIQUE: Sequential images of the abdomen were obtained out to 60 minutes following intravenous administration of radiopharmaceutical. After oral ingestion of Ensure, gallbladder ejection fraction was determined. At 60 min, normal  ejection fraction is greater than 33%. RADIOPHARMACEUTICALS:  5.0 mCi Tc-69m  Choletec IV COMPARISON:  CT 06/30/2017. FINDINGS: Prompt uptake and biliary excretion of activity by the liver is seen. Small photopenic area in the right lobe liver most likely related to a previously identified abscess. Gallbladder activity is visualized, consistent with patency of cystic duct. Biliary activity passes into small bowel, consistent with patent common bile duct. Calculated gallbladder ejection fraction is 61%. (Normal gallbladder ejection fraction with Ensure is greater than 33%.) IMPRESSION: Normal visualization of the gallbladder. Normal gallbladder ejection fraction. Electronically Signed   By: Sumas   On: 07/05/2017 13:59        Scheduled Meds: . enoxaparin (LOVENOX) injection  40 mg Subcutaneous Daily  . fluconazole  400 mg Oral Daily  . insulin aspart  0-9 Units Subcutaneous TID WC  . insulin glargine  20 Units Subcutaneous QHS  . lactobacillus acidophilus  2 tablet Oral TID  . loratadine  10 mg Oral Daily  . pantoprazole  40 mg Oral Daily  .  sodium chloride flush  10-40 mL Intracatheter Q12H  . sodium chloride flush  5 mL Intravenous Q8H   Continuous Infusions: . ampicillin-sulbactam (UNASYN) IV 3 g (07/06/17 0521)     LOS: 8 days    Time spent: 40 minutes   WOODS, Geraldo Docker, MD [facility] Pager [phone]   If 7PM-7AM, please contact night-coverage [email or web address] Password [email or web address] 07/06/2017, 8:38 AM

## 2017-07-06 NOTE — Plan of Care (Signed)
Problem: Education: Goal: Ability to describe self-care measures that may prevent or decrease complications (Diabetes Survival Skills Education) will improve Outcome: Progressing Discussed with patient plan of care for the evening and importance of eating three meals a day with roughly the same fat, protein and carbohydrates with some teach back displayed

## 2017-07-06 NOTE — Progress Notes (Signed)
Patient ID: [name], male   DOB: [birth year], 51 y.o.   MRN: [id number]    Referring Physician(s): Dr. Donnie Mesa  Supervising Physician: Aletta Edouard  Patient Status: [facility] - In-pt  Chief Complaint: Multiple liver abscesses  Subjective: Patient with no new complaints today.  He is still sore in his right upper quadrant, but improving.  Allergies: Patient has no known allergies.  Medications: Prior to Admission medications   Medication Sig Start Date End Date Taking? Authorizing Provider  oxyCODONE-acetaminophen (PERCOCET) 10-325 MG tablet Take 1 tablet by mouth every 4 (four) hours as needed for pain. 06/15/17  Yes Sanjuana Kava, MD  albuterol (PROVENTIL HFA;VENTOLIN HFA) 108 (90 Base) MCG/ACT inhaler Inhale 2 puffs into the lungs every 6 (six) hours as needed for wheezing or shortness of breath. 06/28/17   [name], PA-C  benzonatate (TESSALON PERLES) 100 MG capsule 1-2 po q 8 hour prn cough Patient taking differently: Take 100-200 mg by mouth 3 (three) times daily as needed for cough. 1-2 po q 8 hour prn cough 06/28/17   [name], PA-C  cephALEXin (KEFLEX) 500 MG capsule Take 1 capsule (500 mg total) by mouth 4 (four) times daily. 06/28/17   [name], PA-C  metFORMIN (GLUCOPHAGE) 1000 MG tablet Take 1 tablet (1,000 mg total) by mouth 2 (two) times daily with a meal. 06/28/17   [name], PA-C    Vital Signs: BP (!) 147/89   Pulse 81   Temp 98.3 F (36.8 C) (Oral)   Resp 20   Ht 5\' 7"  (1.702 m)   Wt (!) 375 lb 12.8 oz (170.5 kg)   SpO2 93%   BMI 58.86 kg/m   Physical Exam: Abd: soft, tender in the right upper quadrant. 2 drains in place.  Both drains are more thin than when last seen.  Both drains with still some purulent output, but minimal.  One drain with 10 cc out yesterday and the other with 15 cc out yesterday.  Drain sites are clean, dry, and intact.    Imaging: Ct Abdomen Pelvis W Contrast  Result Date:  07/05/2017 CLINICAL DATA:  51 y/o M; abdominal infections with worsening leukocytosis. Image guided drainage with catheter 5 days ago. EXAM: CT ABDOMEN AND PELVIS WITH CONTRAST TECHNIQUE: Multidetector CT imaging of the abdomen and pelvis was performed using the standard protocol following bolus administration of intravenous contrast. CONTRAST:  100 cc Isovue-300 COMPARISON:  None. FINDINGS: Lower chest: No acute abnormality. Hepatobiliary: 2 percutaneous drainage catheters are present within abscess in the right lobe of the liver. The abscess with catheters are decreased in size from the prior CT with minimal if any residual fluid collection. Irregular abscess with multiple small satellite foci within the dome of the liver spanning approximately 4.3 cm is stable from prior CT. Multiple abscesses within the right inferior lobe of the liver along the capsule are decreased in attenuation and increased in size indicating increased organization and fluid components measuring 1.8, 1.9, 3.4, and 4.5 cm (series 3, image 41, 43, 48). Abscess within the gallbladder fossa is increased in size measuring 8.8 x 4.9 cm in axial plane (AP by ML series 3, image 34). The gallbladder is compressed by the gallbladder fossa abscess. Pancreas: Unremarkable. No pancreatic ductal dilatation or surrounding inflammatory changes. Spleen: Normal in size without focal abnormality. Adrenals/Urinary Tract: Adrenal glands are unremarkable. Kidneys are normal, without renal calculi, focal lesion, or hydronephrosis. Bladder is unremarkable. Stomach/Bowel: Stomach is within normal limits. Appendix appears normal. No evidence  of bowel wall thickening, distention, or inflammatory changes. Vascular/Lymphatic: No significant vascular findings are present. No enlarged abdominal or pelvic lymph nodes. Reproductive: Prostate is unremarkable. Other: Stable paraumbilical hernia containing fat. Musculoskeletal: No fracture is seen. IMPRESSION: 1. Abscesses  associated with drainage catheters in right lobe of liver are decreased in size with minimal if any residual fluid. 2. Increased size and organization of 4 subcapsular abscesses in the right inferior lobe of the liver. 3. Increased size of abscess in the gallbladder fossa. Electronically Signed   By: Kristine Garbe M.D.   On: 07/05/2017 21:07   Nm Hepato W/eject Fract  Result Date: 07/05/2017 CLINICAL DATA:  Abdominal pain. EXAM: NUCLEAR MEDICINE HEPATOBILIARY IMAGING WITH GALLBLADDER EF TECHNIQUE: Sequential images of the abdomen were obtained out to 60 minutes following intravenous administration of radiopharmaceutical. After oral ingestion of Ensure, gallbladder ejection fraction was determined. At 60 min, normal ejection fraction is greater than 33%. RADIOPHARMACEUTICALS:  5.0 mCi Tc-77m  Choletec IV COMPARISON:  CT 06/30/2017. FINDINGS: Prompt uptake and biliary excretion of activity by the liver is seen. Small photopenic area in the right lobe liver most likely related to a previously identified abscess. Gallbladder activity is visualized, consistent with patency of cystic duct. Biliary activity passes into small bowel, consistent with patent common bile duct. Calculated gallbladder ejection fraction is 61%. (Normal gallbladder ejection fraction with Ensure is greater than 33%.) IMPRESSION: Normal visualization of the gallbladder. Normal gallbladder ejection fraction. Electronically Signed   By: Marcello Moores  Register   On: 07/05/2017 13:59    Labs:  CBC:  Recent Labs  07/03/17 0235 07/04/17 0505 07/05/17 0605 07/06/17 0517  WBC 20.9* 22.5* 24.4* 20.7*  HGB 11.6* 11.2* 12.4* 11.5*  HCT 36.5* 36.0* 39.0 36.1*  PLT 557* 602* 645* 580*    COAGS:  Recent Labs  06/30/17 0709  INR 1.24    BMP:  Recent Labs  07/01/17 0318 07/02/17 0522 07/03/17 0235 07/05/17 0605  NA 135 135 137 137  K 3.7 3.5 3.9 4.2  CL 101 101 102 103  CO2 23 23 26 26   GLUCOSE 232* 221* 192* 156*   BUN 8 7 5* 8  CALCIUM 8.1* 8.4* 8.5* 8.7*  CREATININE 0.86 0.71 0.74 0.79  GFRNONAA >60 >60 >60 >60  GFRAA >60 >60 >60 >60    LIVER FUNCTION TESTS:  Recent Labs  06/28/17 1232 06/30/17 0709 07/02/17 0522 07/03/17 0235 07/05/17 0605  BILITOT 3.3* 1.7* 0.9  --  1.0  AST 115* 38 25  --  19  ALT 73* 40 27  --  16*  ALKPHOS 474* 406* 337*  --  294*  PROT 7.9 7.5 7.8  --  7.4  ALBUMIN 2.2* 1.7* 1.6*  1.6* 1.6* 1.8*    Assessment and Plan: 1.  Multiple hepatic abscesses, status post percutaneous drainage  Repeat CT scan shows improvement/resolution of both drained hepatic fluid collections.  However, he still has some purulent drainage between 10 and 15 cc a day from each drain.  We would recommend continuing these drains until his output continues to decrease in his drainage changes to less purulent.  Once this occurs, hopefully over the next 1-2 days, these drains can then be removed.  His repeat CT scan has also been reviewed by Dr. Kathlene Cote.  Other collections he has his liver are not amendable to drainage.  Recommend continued antibiotic therapy.  We will continue to follow the patient.  Electronically Signed: Henreitta Cea 07/06/2017, 10:24 AM   I spent a total  of 15 Minutes at the the patient's bedside AND on the patient's hospital floor or unit, greater than 50% of which was counseling/coordinating care for hepatic abscesses x 2

## 2017-07-07 LAB — GLUCOSE, CAPILLARY
GLUCOSE-CAPILLARY: 164 mg/dL — AB (ref 65–99)
GLUCOSE-CAPILLARY: 138 mg/dL — AB (ref 65–99)
Glucose-Capillary: 140 mg/dL — ABNORMAL HIGH (ref 65–99)
Glucose-Capillary: 133 mg/dL — ABNORMAL HIGH (ref 65–99)

## 2017-07-07 NOTE — Progress Notes (Signed)
PROGRESS NOTE [facility]   [name]   FIE:[id number] DOB: 1966/04/27  DOA: 06/28/2017 PCP: [name], PA-C   Brief Narrative:  [name] is a 51 year old male with medical history of hypertension, diabetes, gout who was sent from [place] for management of intra-abdominal abscess.  CT scan was consistent with multiple liver abscesses and possible cholecystitis.  General surgery was consulted and recommended transfer to [facility] for further evaluation.  Patient was evaluated by IR who placed to percutaneous drain on the right upper quadrant 10/25.   Subjective: Patient seen and examined, doing well, only mild soreness/tenderness over the right upper quadrant area.  Denies any chest pain, shortness of breath dizziness.  Patient remains afebrile.  No acute events overnight. Output from the drains minimal overnight.  Assessment & Plan: Multiple intrahepatic abscesses, and concerns of cholecystitis due to strep bacteremia Status post IR percutaneous drainage x2 IR and surgery managing drainage.  Leukocytosis persistent although now trending down.  ID recommending 4 weeks of Unasyn plus fluconazole for total of 4-week and repeat CT scan at that time. HIDA scan was negative for cholecystitis Continue pain medication as needed, d/c IV dilaudid   Group F streptococcal bacteremia  Treatment per ID.  ECHO no vegetations  Follow up blood cultures, negative thus fay    Diarrhea - felt to be abx related  C diff negative  Improved    DM Type 2  A1C 13.4  Patient started on 70/30 Diabetic coordinator recommendations appreciated  D/c Novolog TID  Continue SSI  Continue to monitor   Leukocytosis and thrombocytosis  Felt to be reactive to infectious process, will check CBC in AM  If WBC improving possible d/c in AM   Iron deficiency anemia  No overt bleeding, Iron supplement on hold due to bacteremia, will resume on upon discharge  Check CBC in AM   Physical  deconditioning  PT recommending HH PT - improving   DVT prophylaxis: lovenox  Code Status: Full  Family Communication: None at bedside  Disposition Plan: home in the next 24-48 Hrs   Consultants:   IR  Surgery   ID   Procedures:   CT guided perc cath x 2  PICC R cephalic   Antimicrobials: Anti-infectives    Start     Dose/Rate Route Frequency Ordered Stop   07/05/17 1200  Ampicillin-Sulbactam (UNASYN) 3 g in sodium chloride 0.9 % 100 mL IVPB     3 g 200 mL/hr over 30 Minutes Intravenous Every 6 hours 07/05/17 1043     07/05/17 1000  fluconazole (DIFLUCAN) tablet 400 mg     400 mg Oral Daily 07/04/17 1445     07/03/17 1530  fluconazole (DIFLUCAN) tablet 200 mg  Status:  Discontinued     200 mg Oral Daily 07/03/17 1506 07/04/17 1445   06/30/17 2000  cefTRIAXone (ROCEPHIN) 2 g in dextrose 5 % 50 mL IVPB  Status:  Discontinued     2 g 100 mL/hr over 30 Minutes Intravenous Every 24 hours 06/30/17 1304 07/05/17 1043   06/30/17 2000  metroNIDAZOLE (FLAGYL) tablet 500 mg  Status:  Discontinued     500 mg Oral Every 8 hours 06/30/17 1304 07/05/17 1043   06/30/17 0500  vancomycin (VANCOCIN) 1,500 mg in sodium chloride 0.9 % 500 mL IVPB  Status:  Discontinued     1,500 mg 250 mL/hr over 120 Minutes Intravenous Every 12 hours 06/29/17 1657 06/30/17 1304   06/29/17 1700  vancomycin (VANCOCIN)  2,500 mg in sodium chloride 0.9 % 500 mL IVPB     2,500 mg 250 mL/hr over 120 Minutes Intravenous  Once 06/29/17 1654 06/29/17 1900   06/29/17 0430  piperacillin-tazobactam (ZOSYN) IVPB 3.375 g  Status:  Discontinued     3.375 g 12.5 mL/hr over 240 Minutes Intravenous Every 8 hours 06/29/17 0042 06/30/17 1304   06/29/17 0013  metroNIDAZOLE (FLAGYL) IVPB 500 mg  Status:  Discontinued     500 mg 100 mL/hr over 60 Minutes Intravenous Every 8 hours 06/29/17 0013 06/29/17 1146   06/28/17 2100  metroNIDAZOLE (FLAGYL) IVPB 500 mg     500 mg 100 mL/hr over 60 Minutes Intravenous  Once 06/28/17 2049  06/28/17 2214   06/28/17 2015  piperacillin-tazobactam (ZOSYN) IVPB 3.375 g     3.375 g 100 mL/hr over 30 Minutes Intravenous  Once 06/28/17 2008 06/28/17 2101       Objective: Vitals:   07/07/17 0500 07/07/17 0521 07/07/17 0600 07/07/17 1218  BP: 127/84  123/80   Pulse: 85  82   Resp: 16  17   Temp:  99.2 F (37.3 C)  98.7 F (37.1 C)  TempSrc:  Oral  Oral  SpO2: 92%  93%   Weight:      Height:        Intake/Output Summary (Last 24 hours) at 07/07/17 1505 Last data filed at 07/07/17 1217  Gross per 24 hour  Intake             1225 ml  Output             1350 ml  Net             -125 ml   Filed Weights   06/28/17 1535 06/29/17 0403  Weight: (!) 170.1 kg (375 lb) (!) 170.5 kg (375 lb 12.8 oz)    Examination:  General exam: NAD  Respiratory system: Clear to auscultation. No wheezes,crackle or rhonchi Cardiovascular system: S1 & S2 heard, RRR. No JVD, murmurs, rubs or gallops Gastrointestinal system: Obese, abdomen is nondistended, soft and nontender. Normal bowel sounds heard. Central nervous system: Alert and oriented. No focal neurological deficits. Extremities: No pedal edema.  Skin: No rashes, lesions or ulcers Psychiatry: Judgement and insight appear poor.   Data Reviewed: I have personally reviewed following labs and imaging studies  CBC:  Recent Labs Lab 07/02/17 0522 07/03/17 0235 07/04/17 0505 07/05/17 0605 07/06/17 0517  WBC 28.3* 20.9* 22.5* 24.4* 20.7*  NEUTROABS  --   --   --  18.1*  --   HGB 11.4* 11.6* 11.2* 12.4* 11.5*  HCT 35.3* 36.5* 36.0* 39.0 36.1*  MCV 81.1 82.2 82.0 81.9 82.0  PLT 520* 557* 602* 645* 161*   Basic Metabolic Panel:  Recent Labs Lab 07/01/17 0318 07/02/17 0522 07/03/17 0235 07/05/17 0605  NA 135 135 137 137  K 3.7 3.5 3.9 4.2  CL 101 101 102 103  CO2 23 23 26 26   GLUCOSE 232* 221* 192* 156*  BUN 8 7 5* 8  CREATININE 0.86 0.71 0.74 0.79  CALCIUM 8.1* 8.4* 8.5* 8.7*  MG 1.9  --   --  2.1  PHOS  --  3.5  4.4 4.0   GFR: Estimated Creatinine Clearance: 164.8 mL/min (by C-G formula based on SCr of 0.79 mg/dL). Liver Function Tests:  Recent Labs Lab 07/02/17 0522 07/03/17 0235 07/05/17 0605  AST 25  --  19  ALT 27  --  16*  ALKPHOS 337*  --  294*  BILITOT 0.9  --  1.0  PROT 7.8  --  7.4  ALBUMIN 1.6*  1.6* 1.6* 1.8*   No results for input(s): LIPASE, AMYLASE in the last 168 hours. No results for input(s): AMMONIA in the last 168 hours. Coagulation Profile: No results for input(s): INR, PROTIME in the last 168 hours. Cardiac Enzymes: No results for input(s): CKTOTAL, CKMB, CKMBINDEX, TROPONINI in the last 168 hours. BNP (last 3 results) No results for input(s): PROBNP in the last 8760 hours. HbA1C: No results for input(s): HGBA1C in the last 72 hours. CBG:  Recent Labs Lab 07/06/17 1208 07/06/17 1610 07/06/17 1946 07/07/17 0836 07/07/17 1210  GLUCAP 189* 104* 181* 140* 164*   Lipid Profile: No results for input(s): CHOL, HDL, LDLCALC, TRIG, CHOLHDL, LDLDIRECT in the last 72 hours. Thyroid Function Tests: No results for input(s): TSH, T4TOTAL, FREET4, T3FREE, THYROIDAB in the last 72 hours. Anemia Panel: No results for input(s): VITAMINB12, FOLATE, FERRITIN, TIBC, IRON, RETICCTPCT in the last 72 hours. Sepsis Labs: No results for input(s): PROCALCITON, LATICACIDVEN in the last 168 hours.  Recent Results (from the past 240 hour(s))  Urine culture     Status: None   Collection Time: 06/28/17  6:28 PM  Result Value Ref Range Status   Specimen Description URINE, RANDOM  Final   Special Requests NONE  Final   Culture   Final    NO GROWTH Performed at [facility], [address], [place], [place] [address]    Report Status 06/30/2017 FINAL  Final  Blood culture (routine x 2)     Status: Abnormal   Collection Time: 06/28/17  6:49 PM  Result Value Ref Range Status   Specimen Description RIGHT ANTECUBITAL  Final   Special Requests   Final    BOTTLES DRAWN  AEROBIC AND ANAEROBIC Blood Culture adequate volume   Culture  Setup Time   Final    GRAM POSITIVE COCCI Gram Stain Report Called to,Read Back By and Verified With: ANGELICA DAVIS. @ [id number] ON [id number] BY HENDERSON L. ANAEROBIC AEROBIC Gram Stain Report Called to,Read Back By and Verified With: MIZE,J @ 1930 ON 10.24.18 BY BOWMAN,L CRITICAL RESULT CALLED TO, READ BACK BY AND VERIFIED WITH: J.LEDFORD PHARMD 06/30/17 0207 L.CHAMPION Performed at [facility], [place] [address] [place], [place] [address]    Culture STREPTOCOCCUS GROUP F (A)  Final   Report Status 07/05/2017 FINAL  Final   Organism ID, Bacteria STREPTOCOCCUS GROUP F  Final      Susceptibility   Streptococcus group f - MIC*    CLINDAMYCIN RESISTANT Resistant     AMPICILLIN <=0.25 SENSITIVE Sensitive     VANCOMYCIN 0.5 SENSITIVE Sensitive     LEVOFLOXACIN <=0.25 SENSITIVE Sensitive     PENICILLIN Value in next row Sensitive      SENSITIVE0.12    * STREPTOCOCCUS GROUP F  Blood culture (routine x 2)     Status: Abnormal   Collection Time: 06/28/17  6:49 PM  Result Value Ref Range Status   Specimen Description BLOOD RIGHT HAND  Final   Special Requests   Final    BOTTLES DRAWN AEROBIC AND ANAEROBIC Blood Culture adequate volume   Culture  Setup Time   Final    GRAM POSITIVE COCCI Gram Stain Report Called to,Read Back By and Verified With: ANGELICA DAVIS @ 3557 ON [id number] BY HENDERSON L. ANAEROBIC AEROBIC Gram Stain Report Called to,Read Back By and Verified With: MIZE, J @ 1930 ON 10.24.18  BY BOWMAN,L    Culture (A)  Final    STREPTOCOCCUS GROUP F SUSCEPTIBILITIES PERFORMED ON PREVIOUS CULTURE WITHIN THE LAST 5 DAYS. Performed at [facility], [name] [address] [place], [place] [address]    Report Status 07/03/2017 FINAL  Final  Blood Culture ID Panel (Reflexed)     Status: Abnormal   Collection Time: 06/28/17  6:49 PM  Result Value Ref Range Status   Enterococcus species NOT DETECTED NOT DETECTED Final    Listeria monocytogenes NOT DETECTED NOT DETECTED Final   Staphylococcus species NOT DETECTED NOT DETECTED Final   Staphylococcus aureus NOT DETECTED NOT DETECTED Final   Streptococcus species DETECTED (A) NOT DETECTED Final    Comment: Not Enterococcus species, Streptococcus agalactiae, Streptococcus pyogenes, or Streptococcus pneumoniae. CRITICAL RESULT CALLED TO, READ BACK BY AND VERIFIED WITH: J.LEDFORD PHARMD 06/30/17 0207 L.CHAMPION    Streptococcus agalactiae NOT DETECTED NOT DETECTED Final   Streptococcus pneumoniae NOT DETECTED NOT DETECTED Final   Streptococcus pyogenes NOT DETECTED NOT DETECTED Final   Acinetobacter baumannii NOT DETECTED NOT DETECTED Final   Enterobacteriaceae species NOT DETECTED NOT DETECTED Final   Enterobacter cloacae complex NOT DETECTED NOT DETECTED Final   Escherichia coli NOT DETECTED NOT DETECTED Final   Klebsiella oxytoca NOT DETECTED NOT DETECTED Final   Klebsiella pneumoniae NOT DETECTED NOT DETECTED Final   Proteus species NOT DETECTED NOT DETECTED Final   Serratia marcescens NOT DETECTED NOT DETECTED Final   Haemophilus influenzae NOT DETECTED NOT DETECTED Final   Neisseria meningitidis NOT DETECTED NOT DETECTED Final   Pseudomonas aeruginosa NOT DETECTED NOT DETECTED Final   Candida albicans NOT DETECTED NOT DETECTED Final   Candida glabrata NOT DETECTED NOT DETECTED Final   Candida krusei NOT DETECTED NOT DETECTED Final   Candida parapsilosis NOT DETECTED NOT DETECTED Final   Candida tropicalis NOT DETECTED NOT DETECTED Final    Comment: Performed at [facility], [place] [address] [place], [place] [address]  MRSA PCR Screening     Status: None   Collection Time: 06/29/17  4:12 AM  Result Value Ref Range Status   MRSA by PCR NEGATIVE NEGATIVE Final    Comment:        The GeneXpert MRSA Assay (FDA approved for NASAL specimens only), is one component of a comprehensive MRSA colonization surveillance program. It is not intended to  diagnose MRSA infection nor to guide or monitor treatment for MRSA infections.   Culture, blood (routine x 2)     Status: None   Collection Time: 06/29/17  6:58 PM  Result Value Ref Range Status   Specimen Description BLOOD RIGHT HAND  Final   Special Requests   Final    BOTTLES DRAWN AEROBIC ONLY Blood Culture adequate volume   Culture NO GROWTH 5 DAYS  Final   Report Status 07/04/2017 FINAL  Final  Culture, blood (routine x 2)     Status: None   Collection Time: 06/29/17  7:09 PM  Result Value Ref Range Status   Specimen Description BLOOD RIGHT ANTECUBITAL  Final   Special Requests   Final    BOTTLES DRAWN AEROBIC AND ANAEROBIC Blood Culture adequate volume   Culture NO GROWTH 5 DAYS  Final   Report Status 07/04/2017 FINAL  Final  Aerobic/Anaerobic Culture (surgical/deep wound)     Status: None   Collection Time: 06/30/17  1:31 PM  Result Value Ref Range Status   Specimen Description ABSCESS  Final   Special Requests HEPATIC ABSCESS DRAINAGE  Final   Gram Stain   Final    ABUNDANT WBC PRESENT, PREDOMINANTLY PMN ABUNDANT GRAM POSITIVE COCCI ABUNDANT GRAM POSITIVE RODS    Culture   Final    FEW CANDIDA ALBICANS MODERATE STREPTOCOCCUS GROUP C MODERATE STREPTOCOCCUS GROUP F NO ANAEROBES ISOLATED    Report Status 07/05/2017 FINAL  Final  C difficile quick scan w PCR reflex     Status: None   Collection Time: 07/01/17  8:00 PM  Result Value Ref Range Status   C Diff antigen NEGATIVE NEGATIVE Final   C Diff toxin NEGATIVE NEGATIVE Final   C Diff interpretation No C. difficile detected.  Final  Culture, blood (routine x 2)     Status: None (Preliminary result)   Collection Time: 07/05/17  4:52 PM  Result Value Ref Range Status   Specimen Description BLOOD LEFT HAND  Final   Special Requests IN PEDIATRIC BOTTLE Blood Culture adequate volume  Final   Culture NO GROWTH < 24 HOURS  Final   Report Status PENDING  Incomplete  Culture, blood (routine x 2)     Status: None  (Preliminary result)   Collection Time: 07/05/17  4:55 PM  Result Value Ref Range Status   Specimen Description BLOOD LEFT ARM  Final   Special Requests IN PEDIATRIC BOTTLE Blood Culture adequate volume  Final   Culture NO GROWTH < 24 HOURS  Final   Report Status PENDING  Incomplete      Radiology Studies: Ct Abdomen Pelvis W Contrast  Result Date: 07/05/2017 CLINICAL DATA:  51 y/o M; abdominal infections with worsening leukocytosis. Image guided drainage with catheter 5 days ago. EXAM: CT ABDOMEN AND PELVIS WITH CONTRAST TECHNIQUE: Multidetector CT imaging of the abdomen and pelvis was performed using the standard protocol following bolus administration of intravenous contrast. CONTRAST:  100 cc Isovue-300 COMPARISON:  None. FINDINGS: Lower chest: No acute abnormality. Hepatobiliary: 2 percutaneous drainage catheters are present within abscess in the right lobe of the liver. The abscess with catheters are decreased in size from the prior CT with minimal if any residual fluid collection. Irregular abscess with multiple small satellite foci within the dome of the liver spanning approximately 4.3 cm is stable from prior CT. Multiple abscesses within the right inferior lobe of the liver along the capsule are decreased in attenuation and increased in size indicating increased organization and fluid components measuring 1.8, 1.9, 3.4, and 4.5 cm (series 3, image 41, 43, 48). Abscess within the gallbladder fossa is increased in size measuring 8.8 x 4.9 cm in axial plane (AP by ML series 3, image 34). The gallbladder is compressed by the gallbladder fossa abscess. Pancreas: Unremarkable. No pancreatic ductal dilatation or surrounding inflammatory changes. Spleen: Normal in size without focal abnormality. Adrenals/Urinary Tract: Adrenal glands are unremarkable. Kidneys are normal, without renal calculi, focal lesion, or hydronephrosis. Bladder is unremarkable. Stomach/Bowel: Stomach is within normal limits.  Appendix appears normal. No evidence of bowel wall thickening, distention, or inflammatory changes. Vascular/Lymphatic: No significant vascular findings are present. No enlarged abdominal or pelvic lymph nodes. Reproductive: Prostate is unremarkable. Other: Stable paraumbilical hernia containing fat. Musculoskeletal: No fracture is seen. IMPRESSION: 1. Abscesses associated with drainage catheters in right lobe of liver are decreased in size with minimal if any residual fluid. 2. Increased size and organization of 4 subcapsular abscesses in the right inferior lobe of the liver. 3. Increased size of abscess in the gallbladder fossa. Electronically Signed   By: Kristine Garbe M.D.   On: 07/05/2017  21:07      Scheduled Meds: . enoxaparin (LOVENOX) injection  40 mg Subcutaneous Daily  . fluconazole  400 mg Oral Daily  . insulin aspart  0-9 Units Subcutaneous TID WC  . insulin aspart protamine- aspart  10 Units Subcutaneous BID WC  . lactobacillus acidophilus  2 tablet Oral TID  . loratadine  10 mg Oral Daily  . pantoprazole  40 mg Oral Daily  . sodium chloride flush  10-40 mL Intracatheter Q12H  . sodium chloride flush  5 mL Intravenous Q8H   Continuous Infusions: . ampicillin-sulbactam (UNASYN) IV 3 g (07/07/17 1300)     LOS: 9 days    Time spent: Total of 25 minutes spent with pt, greater than 50% of which was spent in discussion of  treatment, counseling and coordination of care    Chipper Oman, MD Pager: Text Page via [email or web address]   If 7PM-7AM, please contact night-coverage [email or web address] 07/07/2017, 3:05 PM

## 2017-07-07 NOTE — Progress Notes (Signed)
Call to [facility] [phone] and left message for return cal reference to patience JP drain site #2 red swollen and draining serious drainage tender to flush.  Different from Tuesday's dressing change. Waiting return call.  Area around drains are taunt.

## 2017-07-07 NOTE — Progress Notes (Signed)
Patient ID: [name], male   DOB: [birth year], 51 y.o.   MRN: [id number]    Referring Physician(s): Dr. Donnie Mesa  Supervising Physician: Aletta Edouard  Patient Status: [facility] - In-pt  Chief Complaint: Multiple liver abscesses  Subjective: Hand cramping today.  No complaints related to drain  Allergies: Patient has no known allergies.  Medications: Prior to Admission medications   Medication Sig Start Date End Date Taking? Authorizing Provider  oxyCODONE-acetaminophen (PERCOCET) 10-325 MG tablet Take 1 tablet by mouth every 4 (four) hours as needed for pain. 06/15/17  Yes Sanjuana Kava, MD  albuterol (PROVENTIL HFA;VENTOLIN HFA) 108 (90 Base) MCG/ACT inhaler Inhale 2 puffs into the lungs every 6 (six) hours as needed for wheezing or shortness of breath. 06/28/17   [name], PA-C  benzonatate (TESSALON PERLES) 100 MG capsule 1-2 po q 8 hour prn cough Patient taking differently: Take 100-200 mg by mouth 3 (three) times daily as needed for cough. 1-2 po q 8 hour prn cough 06/28/17   [name], PA-C  cephALEXin (KEFLEX) 500 MG capsule Take 1 capsule (500 mg total) by mouth 4 (four) times daily. 06/28/17   [name], PA-C  metFORMIN (GLUCOPHAGE) 1000 MG tablet Take 1 tablet (1,000 mg total) by mouth 2 (two) times daily with a meal. 06/28/17   [name], PA-C    Vital Signs: BP 123/80   Pulse 82   Temp 99.2 F (37.3 C) (Oral)   Resp 17   Ht 5\' 7"  (1.702 m)   Wt (!) 375 lb 12.8 oz (170.5 kg)   SpO2 93%   BMI 58.86 kg/m   Physical Exam: Abd: Soft, sore.  Drains intact.  Old, dried blood at insertion site but otherwise intact.  No output recorded overnight.  Small amount of purulent material in bulb.  Flushes easily per RN.   Imaging: Ct Abdomen Pelvis W Contrast  Result Date: 07/05/2017 CLINICAL DATA:  51 y/o M; abdominal infections with worsening leukocytosis. Image guided drainage with catheter 5 days ago. EXAM: CT ABDOMEN AND PELVIS WITH  CONTRAST TECHNIQUE: Multidetector CT imaging of the abdomen and pelvis was performed using the standard protocol following bolus administration of intravenous contrast. CONTRAST:  100 cc Isovue-300 COMPARISON:  None. FINDINGS: Lower chest: No acute abnormality. Hepatobiliary: 2 percutaneous drainage catheters are present within abscess in the right lobe of the liver. The abscess with catheters are decreased in size from the prior CT with minimal if any residual fluid collection. Irregular abscess with multiple small satellite foci within the dome of the liver spanning approximately 4.3 cm is stable from prior CT. Multiple abscesses within the right inferior lobe of the liver along the capsule are decreased in attenuation and increased in size indicating increased organization and fluid components measuring 1.8, 1.9, 3.4, and 4.5 cm (series 3, image 41, 43, 48). Abscess within the gallbladder fossa is increased in size measuring 8.8 x 4.9 cm in axial plane (AP by ML series 3, image 34). The gallbladder is compressed by the gallbladder fossa abscess. Pancreas: Unremarkable. No pancreatic ductal dilatation or surrounding inflammatory changes. Spleen: Normal in size without focal abnormality. Adrenals/Urinary Tract: Adrenal glands are unremarkable. Kidneys are normal, without renal calculi, focal lesion, or hydronephrosis. Bladder is unremarkable. Stomach/Bowel: Stomach is within normal limits. Appendix appears normal. No evidence of bowel wall thickening, distention, or inflammatory changes. Vascular/Lymphatic: No significant vascular findings are present. No enlarged abdominal or pelvic lymph nodes. Reproductive: Prostate is unremarkable. Other: Stable paraumbilical hernia containing fat. Musculoskeletal:  No fracture is seen. IMPRESSION: 1. Abscesses associated with drainage catheters in right lobe of liver are decreased in size with minimal if any residual fluid. 2. Increased size and organization of 4 subcapsular  abscesses in the right inferior lobe of the liver. 3. Increased size of abscess in the gallbladder fossa. Electronically Signed   By: Kristine Garbe M.D.   On: 07/05/2017 21:07   Nm Hepato W/eject Fract  Result Date: 07/05/2017 CLINICAL DATA:  Abdominal pain. EXAM: NUCLEAR MEDICINE HEPATOBILIARY IMAGING WITH GALLBLADDER EF TECHNIQUE: Sequential images of the abdomen were obtained out to 60 minutes following intravenous administration of radiopharmaceutical. After oral ingestion of Ensure, gallbladder ejection fraction was determined. At 60 min, normal ejection fraction is greater than 33%. RADIOPHARMACEUTICALS:  5.0 mCi Tc-27m  Choletec IV COMPARISON:  CT 06/30/2017. FINDINGS: Prompt uptake and biliary excretion of activity by the liver is seen. Small photopenic area in the right lobe liver most likely related to a previously identified abscess. Gallbladder activity is visualized, consistent with patency of cystic duct. Biliary activity passes into small bowel, consistent with patent common bile duct. Calculated gallbladder ejection fraction is 61%. (Normal gallbladder ejection fraction with Ensure is greater than 33%.) IMPRESSION: Normal visualization of the gallbladder. Normal gallbladder ejection fraction. Electronically Signed   By: Marcello Moores  Register   On: 07/05/2017 13:59    Labs:  CBC:  Recent Labs  07/03/17 0235 07/04/17 0505 07/05/17 0605 07/06/17 0517  WBC 20.9* 22.5* 24.4* 20.7*  HGB 11.6* 11.2* 12.4* 11.5*  HCT 36.5* 36.0* 39.0 36.1*  PLT 557* 602* 645* 580*    COAGS:  Recent Labs  06/30/17 0709  INR 1.24    BMP:  Recent Labs  07/01/17 0318 07/02/17 0522 07/03/17 0235 07/05/17 0605  NA 135 135 137 137  K 3.7 3.5 3.9 4.2  CL 101 101 102 103  CO2 23 23 26 26   GLUCOSE 232* 221* 192* 156*  BUN 8 7 5* 8  CALCIUM 8.1* 8.4* 8.5* 8.7*  CREATININE 0.86 0.71 0.74 0.79  GFRNONAA >60 >60 >60 >60  GFRAA >60 >60 >60 >60    LIVER FUNCTION TESTS:  Recent  Labs  06/28/17 1232 06/30/17 0709 07/02/17 0522 07/03/17 0235 07/05/17 0605  BILITOT 3.3* 1.7* 0.9  --  1.0  AST 115* 38 25  --  19  ALT 73* 40 27  --  16*  ALKPHOS 474* 406* 337*  --  294*  PROT 7.9 7.5 7.8  --  7.4  ALBUMIN 2.2* 1.7* 1.6*  1.6* 1.6* 1.8*    Assessment and Plan: Multiple hepatic abscesses, status post percutaneous drainage 10/25 Only a small amount of output in bulb today.  Will discuss with MD.  WBC remains elevated. Afebrile.  Continue drain care for now.   Electronically Signed: Docia Barrier 07/07/2017, 11:12 AM   I spent a total of 15 Minutes at the the patient's bedside AND on the patient's hospital floor or unit, greater than 50% of which was counseling/coordinating care for hepatic abscesses

## 2017-07-07 NOTE — Progress Notes (Signed)
Inpatient Diabetes Program Recommendations  AACE/ADA: New Consensus Statement on Inpatient Glycemic Control (2015)  Target Ranges:  Prepandial:   less than 140 mg/dL      Peak postprandial:   less than 180 mg/dL (1-2 hours)      Critically ill patients:  140 - 180 mg/dL   Results for TIMONTHY, HOVATER (MRN [id number]) as of 07/07/2017 09:14  Ref. Range 07/06/2017 07:43 07/06/2017 12:08 07/06/2017 16:10 07/06/2017 19:46  Glucose-Capillary Latest Ref Range: 65 - 99 mg/dL 126 (H) 189 (H) 104 (H) 181 (H)   Results for SAHEJ, SCHRIEBER (MRN [id number]) as of 07/07/2017 09:14  Ref. Range 07/07/2017 08:36  Glucose-Capillary Latest Ref Range: 65 - 99 mg/dL 140 (H)    Home DM Meds: Metformin 1000 mg BID  Current Insulin Orders: Novolog 5 units TID with meals                                       Novolog Sensitive Correction Scale/ SSI (0-9 units) TID AC      70/30 Insulin- 10 units BID      MD- Please consider discontinuation of Novolog 5 units TID with meals.  Extra Novolog Meal Coverage not recommended to be given with 70/30 Insulin.     At time of discharge, please make sure to give patient Rxs for the following:  Reli-On 70/30 Insulin- order # [id number] Insulin Syringes- order # [id number]      --Will follow patient during hospitalization--  Wyn Quaker RN, MSN, CDE Diabetes Coordinator Inpatient Glycemic Control Team Team Pager: [phone] (8a-5p)

## 2017-07-07 NOTE — Progress Notes (Signed)
Occupational Therapy Treatment Patient Details Name: CHRISTYAN REGER MRN: [id number] DOB: 11-Nov-1965 Today's Date: 07/07/2017    History of present illness Patient is a 51 yo male admitted 06/28/17 with RUQ abdominal pain.  Patient with hepatic abscess and cholecystitis, with percutaneous drains placed 06/30/17.     PMH:  obesity, DM, HTN, gout, OA   OT comments  Pt making good progress toward goals. Educated on use of toilet aid to assist with pericare. Pt will benefit from further AE for LB ADL. Will follow to maximize functional level of independence with goal to DC home.   Follow Up Recommendations  Supervision/Assistance - 24 hour;Home health OT    Equipment Recommendations  Tub/shower bench    Recommendations for Other Services      Precautions / Restrictions Precautions Precautions: None Precaution Comments: 2 percutaneous drains; watch HR and BP Restrictions Weight Bearing Restrictions: No       Mobility Bed Mobility Overal bed mobility: Needs Assistance Bed Mobility: Sit to Supine       Sit to supine: Min assist   General bed mobility comments: Min A to lift RLE due to pain  Transfers Overall transfer level: Needs assistance Equipment used: Straight cane Transfers: Sit to/from Stand Sit to Stand: Supervision         General transfer comment: for lines and safety only.    Balance Overall balance assessment: Needs assistance Sitting-balance support: Feet supported;No upper extremity supported Sitting balance-Leahy Scale: Good     Standing balance support: During functional activity;Single extremity supported Standing balance-Leahy Scale: Fair Standing balance comment: Requires UE support for dynamic standing tasks.                            ADL either performed or assessed with clinical judgement   ADL                                       Functional mobility during ADLs: Min guard General ADL Comments: Pt staes at  baseline he is able to complete his LB ADL, slthough at times it is difficult. Pt educated on use of toilet aid for pericare. Pt given toilet aid. Pt verbalized understanding uof use. Nsg aware. Pt will benefit from further education on AE for ADL.      Vision       Perception     Praxis      Cognition Arousal/Alertness: Awake/alert Behavior During Therapy: WFL for tasks assessed/performed Overall Cognitive Status: Within Functional Limits for tasks assessed                                          Exercises     Shoulder Instructions       General Comments Pt very appreciative and wants to be independent; expressing desire to eat healthier and lose weight    Pertinent Vitals/ Pain       Pain Assessment: Faces Pain Score: 10-Worst pain ever Faces Pain Scale: Hurts even more Pain Location: abdomen, R side with transitional movements Pain Descriptors / Indicators: Grimacing;Guarding;Sore Pain Intervention(s): Limited activity within patient's tolerance  Home Living  Prior Functioning/Environment              Frequency  Min 2X/week        Progress Toward Goals  OT Goals(current goals can now be found in the care plan section)  Progress towards OT goals: Progressing toward goals  Acute Rehab OT Goals Patient Stated Goal: To get stronger OT Goal Formulation: With patient Time For Goal Achievement: 07/17/17 Potential to Achieve Goals: Good ADL Goals Pt Will Perform Grooming: with supervision;standing Pt Will Perform Upper Body Bathing: with supervision;sitting Pt Will Perform Lower Body Bathing: with supervision;sit to/from stand Pt Will Transfer to Toilet: with supervision;ambulating;bedside commode Pt Will Perform Toileting - Clothing Manipulation and hygiene: with supervision;sit to/from stand  Plan Discharge plan needs to be updated    Co-evaluation                  AM-PAC PT "6 Clicks" Daily Activity     Outcome Measure   Help from another person eating meals?: None Help from another person taking care of personal grooming?: None Help from another person toileting, which includes using toliet, bedpan, or urinal?: A Lot Help from another person bathing (including washing, rinsing, drying)?: A Lot Help from another person to put on and taking off regular upper body clothing?: A Little Help from another person to put on and taking off regular lower body clothing?: A Lot 6 Click Score: 17    End of Session    OT Visit Diagnosis: Unsteadiness on feet (R26.81);Other abnormalities of gait and mobility (R26.89);Muscle weakness (generalized) (M62.81);Pain Pain - Right/Left: Right Pain - part of body:  (abdomen)   Activity Tolerance Patient tolerated treatment well   Patient Left in bed;with call bell/phone within reach   Nurse Communication Mobility status;Other (comment) (AE for toileting)        Time: 1450-1505 OT Time Calculation (min): 15 min  Charges: OT General Charges $OT Visit: 1 Visit OT Treatments $Self Care/Home Management : 8-22 mins  [facility], OT/L  [phone] 07/07/2017   Tracy Kinner,HILLARY 07/07/2017, 3:17 PM

## 2017-07-07 NOTE — Progress Notes (Signed)
Dr Grandville Silos returned called stated he would definitely look into this and would not be surprised if al little infection may not not be surrounding site.

## 2017-07-07 NOTE — Progress Notes (Signed)
Physical Therapy Treatment Patient Details Name: [name] MRN: [id number] DOB: February 20, 1966 Today's Date: 07/07/2017    History of Present Illness Patient is a 51 yo male admitted 06/28/17 with RUQ abdominal pain.  Patient with hepatic abscess and cholecystitis, with percutaneous drains placed 06/30/17.     PMH:  obesity, DM, HTN, gout, OA    PT Comments    Patient progressing well towards PT goals. Tolerated gait training with SPC today. Pt exerting more effort and energy using SPC with HR up to 143 bpm and dizziness post ambulation bout. BP dropped as well post activity from 123/89 to 95/83. Encouraged pt to increase activity level and walking while in hospital to improve endurance and mobility. Will follow.    Follow Up Recommendations  Home health PT;Supervision - Intermittent     Equipment Recommendations  Rolling walker with 5" wheels;3in1 (PT)    Recommendations for Other Services       Precautions / Restrictions Precautions Precautions: None Precaution Comments: 2 percutaneous drains; watch HR and BP Restrictions Weight Bearing Restrictions: No    Mobility  Bed Mobility               General bed mobility comments: Sitting EOB upon PT arrival.   Transfers Overall transfer level: Needs assistance Equipment used: Straight cane Transfers: Sit to/from Stand Sit to Stand: Supervision         General transfer comment: for lines and safety only.  Ambulation/Gait Ambulation/Gait assistance: Supervision Ambulation Distance (Feet): 170 Feet Assistive device: Straight cane Gait Pattern/deviations: Decreased stride length;Step-through pattern;Wide base of support Gait velocity: decreased   General Gait Details: Slow, mildly unsteady gait with 2/4 DOE. HR ranged from 115-143 bpm. + dizziness towards end of ambulation.   Stairs            Wheelchair Mobility    Modified Rankin (Stroke Patients Only)       Balance Overall balance assessment: Needs  assistance Sitting-balance support: Feet supported;No upper extremity supported Sitting balance-Leahy Scale: Good     Standing balance support: During functional activity;Single extremity supported Standing balance-Leahy Scale: Fair Standing balance comment: Requires UE support for dynamic standing tasks.                             Cognition Arousal/Alertness: Awake/alert Behavior During Therapy: WFL for tasks assessed/performed Overall Cognitive Status: Within Functional Limits for tasks assessed                                        Exercises      General Comments General comments (skin integrity, edema, etc.): Sitting BP pre activity 123/89, sitting BP post ambulation 95/83, sitting BP ~2 min 103/67.      Pertinent Vitals/Pain Pain Assessment: 0-10 Pain Score: 10-Worst pain ever Pain Location: abdomen, R side with transitional movements Pain Descriptors / Indicators: Grimacing;Guarding;Sore Pain Intervention(s): Monitored during session;Repositioned;Limited activity within patient's tolerance    Home Living                      Prior Function            PT Goals (current goals can now be found in the care plan section) Progress towards PT goals: Progressing toward goals    Frequency    Min 3X/week      PT Plan Current  plan remains appropriate    Co-evaluation              AM-PAC PT "6 Clicks" Daily Activity  Outcome Measure  Difficulty turning over in bed (including adjusting bedclothes, sheets and blankets)?: A Little Difficulty moving from lying on back to sitting on the side of the bed? : Unable Difficulty sitting down on and standing up from a chair with arms (e.g., wheelchair, bedside commode, etc,.)?: A Little Help needed moving to and from a bed to chair (including a wheelchair)?: A Little Help needed walking in hospital room?: A Little Help needed climbing 3-5 steps with a railing? : A Little 6 Click  Score: 16    End of Session   Activity Tolerance: Patient tolerated treatment well;Patient limited by fatigue;Treatment limited secondary to medical complications (Comment) (drop in BP and elevated HR) Patient left: in bed;with call bell/phone within reach;Other (comment) (sitting EOB with OT present in room.) Nurse Communication: Mobility status PT Visit Diagnosis: Unsteadiness on feet (R26.81);Pain Pain - Right/Left: Right Pain - part of body:  (RUQ abdomen)     Time: 0175-1025 PT Time Calculation (min) (ACUTE ONLY): 17 min  Charges:  $Gait Training: 8-22 mins                    G Codes:       Wray Kearns, PT, DPT [phone]     Marguarite Arbour A Nori Winegar 07/07/2017, 2:57 PM

## 2017-07-07 NOTE — Progress Notes (Signed)
[facility] Progress Note     Subjective: CC:  Still with mild RUQ/epigastric pain. Tolerating PO. Having flatus/BMs. Denies fever/chills. Eager to go home.   Afebrile, VSS WBC 20.7 yesterday from 24.4  Blood glucose 140 this morning  Objective: Vital signs in last 24 hours: Temp:  [98 F (36.7 C)-99.2 F (37.3 C)] 99.2 F (37.3 C) (11/01 0521) Pulse Rate:  [80-104] 82 (11/01 0600) Resp:  [15-28] 17 (11/01 0600) BP: (110-154)/(68-109) 123/80 (11/01 0600) SpO2:  [87 %-98 %] 93 % (11/01 0600) Last BM Date: 07/06/17  Intake/Output from previous day: 10/31 0701 - 11/01 0700 In: 1960 [P.O.:1180; I.V.:55; IV Piggyback:700] Out: 1450 [Urine:1450] Intake/Output this shift: No intake/output data recorded.  PE: Gen:  Alert, NAD, pleasant and cooperative  Pulm:  Normal effort, clear to auscultation bilaterally Abd: Soft, minimally tender to palpation right hemiabdomen and epigastrium without peritonitis, drains sites c/d/i with minimal purulent output.  No drain output documented past 24h  Lab Results:   Recent Labs  07/05/17 0605 07/06/17 0517  WBC 24.4* 20.7*  HGB 12.4* 11.5*  HCT 39.0 36.1*  PLT 645* 580*   BMET  Recent Labs  07/05/17 0605  NA 137  K 4.2  CL 103  CO2 26  GLUCOSE 156*  BUN 8  CREATININE 0.79  CALCIUM 8.7*   PT/INR No results for input(s): LABPROT, INR in the last 72 hours. CMP     Component Value Date/Time   NA 137 07/05/2017 0605   K 4.2 07/05/2017 0605   CL 103 07/05/2017 0605   CO2 26 07/05/2017 0605   GLUCOSE 156 (H) 07/05/2017 0605   BUN 8 07/05/2017 0605   CREATININE 0.79 07/05/2017 0605   CALCIUM 8.7 (L) 07/05/2017 0605   PROT 7.4 07/05/2017 0605   ALBUMIN 1.8 (L) 07/05/2017 0605   AST 19 07/05/2017 0605   ALT 16 (L) 07/05/2017 0605   ALKPHOS 294 (H) 07/05/2017 0605   BILITOT 1.0 07/05/2017 0605   GFRNONAA >60 07/05/2017 0605   GFRAA >60 07/05/2017 0605   Lipase     Component Value Date/Time   LIPASE 20  06/28/2017 1849       Studies/Results: Ct Abdomen Pelvis W Contrast  Result Date: 07/05/2017 CLINICAL DATA:  51 y/o M; abdominal infections with worsening leukocytosis. Image guided drainage with catheter 5 days ago. EXAM: CT ABDOMEN AND PELVIS WITH CONTRAST TECHNIQUE: Multidetector CT imaging of the abdomen and pelvis was performed using the standard protocol following bolus administration of intravenous contrast. CONTRAST:  100 cc Isovue-300 COMPARISON:  None. FINDINGS: Lower chest: No acute abnormality. Hepatobiliary: 2 percutaneous drainage catheters are present within abscess in the right lobe of the liver. The abscess with catheters are decreased in size from the prior CT with minimal if any residual fluid collection. Irregular abscess with multiple small satellite foci within the dome of the liver spanning approximately 4.3 cm is stable from prior CT. Multiple abscesses within the right inferior lobe of the liver along the capsule are decreased in attenuation and increased in size indicating increased organization and fluid components measuring 1.8, 1.9, 3.4, and 4.5 cm (series 3, image 41, 43, 48). Abscess within the gallbladder fossa is increased in size measuring 8.8 x 4.9 cm in axial plane (AP by ML series 3, image 34). The gallbladder is compressed by the gallbladder fossa abscess. Pancreas: Unremarkable. No pancreatic ductal dilatation or surrounding inflammatory changes. Spleen: Normal in size without focal abnormality. Adrenals/Urinary Tract: Adrenal glands are unremarkable. Kidneys are normal,  without renal calculi, focal lesion, or hydronephrosis. Bladder is unremarkable. Stomach/Bowel: Stomach is within normal limits. Appendix appears normal. No evidence of bowel wall thickening, distention, or inflammatory changes. Vascular/Lymphatic: No significant vascular findings are present. No enlarged abdominal or pelvic lymph nodes. Reproductive: Prostate is unremarkable. Other: Stable  paraumbilical hernia containing fat. Musculoskeletal: No fracture is seen. IMPRESSION: 1. Abscesses associated with drainage catheters in right lobe of liver are decreased in size with minimal if any residual fluid. 2. Increased size and organization of 4 subcapsular abscesses in the right inferior lobe of the liver. 3. Increased size of abscess in the gallbladder fossa. Electronically Signed   By: Kristine Garbe M.D.   On: 07/05/2017 21:07   Nm Hepato W/eject Fract  Result Date: 07/05/2017 CLINICAL DATA:  Abdominal pain. EXAM: NUCLEAR MEDICINE HEPATOBILIARY IMAGING WITH GALLBLADDER EF TECHNIQUE: Sequential images of the abdomen were obtained out to 60 minutes following intravenous administration of radiopharmaceutical. After oral ingestion of Ensure, gallbladder ejection fraction was determined. At 60 min, normal ejection fraction is greater than 33%. RADIOPHARMACEUTICALS:  5.0 mCi Tc-72m  Choletec IV COMPARISON:  CT 06/30/2017. FINDINGS: Prompt uptake and biliary excretion of activity by the liver is seen. Small photopenic area in the right lobe liver most likely related to a previously identified abscess. Gallbladder activity is visualized, consistent with patency of cystic duct. Biliary activity passes into small bowel, consistent with patent common bile duct. Calculated gallbladder ejection fraction is 61%. (Normal gallbladder ejection fraction with Ensure is greater than 33%.) IMPRESSION: Normal visualization of the gallbladder. Normal gallbladder ejection fraction. Electronically Signed   By: Marcello Moores  Register   On: 07/05/2017 13:59    Anti-infectives: Anti-infectives    Start     Dose/Rate Route Frequency Ordered Stop   07/05/17 1200  Ampicillin-Sulbactam (UNASYN) 3 g in sodium chloride 0.9 % 100 mL IVPB     3 g 200 mL/hr over 30 Minutes Intravenous Every 6 hours 07/05/17 1043     07/05/17 1000  fluconazole (DIFLUCAN) tablet 400 mg     400 mg Oral Daily 07/04/17 1445     07/03/17  1530  fluconazole (DIFLUCAN) tablet 200 mg  Status:  Discontinued     200 mg Oral Daily 07/03/17 1506 07/04/17 1445   06/30/17 2000  cefTRIAXone (ROCEPHIN) 2 g in dextrose 5 % 50 mL IVPB  Status:  Discontinued     2 g 100 mL/hr over 30 Minutes Intravenous Every 24 hours 06/30/17 1304 07/05/17 1043   06/30/17 2000  metroNIDAZOLE (FLAGYL) tablet 500 mg  Status:  Discontinued     500 mg Oral Every 8 hours 06/30/17 1304 07/05/17 1043   06/30/17 0500  vancomycin (VANCOCIN) 1,500 mg in sodium chloride 0.9 % 500 mL IVPB  Status:  Discontinued     1,500 mg 250 mL/hr over 120 Minutes Intravenous Every 12 hours 06/29/17 1657 06/30/17 1304   06/29/17 1700  vancomycin (VANCOCIN) 2,500 mg in sodium chloride 0.9 % 500 mL IVPB     2,500 mg 250 mL/hr over 120 Minutes Intravenous  Once 06/29/17 1654 06/29/17 1900   06/29/17 0430  piperacillin-tazobactam (ZOSYN) IVPB 3.375 g  Status:  Discontinued     3.375 g 12.5 mL/hr over 240 Minutes Intravenous Every 8 hours 06/29/17 0042 06/30/17 1304   06/29/17 0013  metroNIDAZOLE (FLAGYL) IVPB 500 mg  Status:  Discontinued     500 mg 100 mL/hr over 60 Minutes Intravenous Every 8 hours 06/29/17 0013 06/29/17 1146   06/28/17 2100  metroNIDAZOLE (  FLAGYL) IVPB 500 mg     500 mg 100 mL/hr over 60 Minutes Intravenous  Once 06/28/17 2049 06/28/17 2214   06/28/17 2015  piperacillin-tazobactam (ZOSYN) IVPB 3.375 g     3.375 g 100 mL/hr over 30 Minutes Intravenous  Once 06/28/17 2008 06/28/17 2101     Assessment/Plan Liver abscess - IR drain x 2 06/30/17 (CX: strep +, candida); Repeat CT with enlargement perihepatic fluid collections not amenable to IR drainage. Continue non-surgical management with IV abx per ID. Questionable Cholecystitis - Negative HIDA streptococcal bacteremia -  IV Unasyn/ID following  Diabetes mellitus - A1c 13.4, glucose in the 200s HTN - norvasc Body mass index is 58.8  FEN: No IV fluids/Carb Mod VTE: SCDs, lovenox  ID: Zosyn (10/23-10/25),  Vanc (10/24-10/25), Flagyl (10/23>>), Ceftriaxone (10/25-10/30), PO fluconazole 10/28 >> (Day #4), Unasyn IV 10/30 >> (Day #3)   Plan:  Ongoing conservative management, we will follow with you   LOS: 9 days    Jill Alexanders , [facility] 07/07/2017, 9:46 AM Pager: [phone] Consults: [phone] Mon-Fri 7:00 am-4:30 pm Sat-Sun 7:00 am-11:30 am

## 2017-07-07 NOTE — Progress Notes (Signed)
Spoke with Dr. Barbie Banner.  Keep drains for now.   Brynda Greathouse, MS RD PA-C 1:30 PM

## 2017-07-08 DIAGNOSIS — L0291 Cutaneous abscess, unspecified: Secondary | ICD-10-CM

## 2017-07-08 LAB — CBC WITH DIFFERENTIAL/PLATELET
BASOS PCT: 0 %
Basophils Absolute: 0 10*3/uL (ref 0.0–0.1)
EOS PCT: 1 %
Eosinophils Absolute: 0.2 10*3/uL (ref 0.0–0.7)
HEMATOCRIT: 35 % — AB (ref 39.0–52.0)
Hemoglobin: 11 g/dL — ABNORMAL LOW (ref 13.0–17.0)
Lymphocytes Relative: 18 %
Lymphs Abs: 3.2 10*3/uL (ref 0.7–4.0)
MCH: 25.9 pg — AB (ref 26.0–34.0)
MCHC: 31.4 g/dL (ref 30.0–36.0)
MCV: 82.4 fL (ref 78.0–100.0)
MONO ABS: 1.9 10*3/uL — AB (ref 0.1–1.0)
MONOS PCT: 11 %
NEUTROS PCT: 70 %
Neutro Abs: 12.4 10*3/uL — ABNORMAL HIGH (ref 1.7–7.7)
PLATELETS: 535 10*3/uL — AB (ref 150–400)
RBC: 4.25 MIL/uL (ref 4.22–5.81)
RDW: 15.5 % (ref 11.5–15.5)
WBC: 17.7 10*3/uL — ABNORMAL HIGH (ref 4.0–10.5)

## 2017-07-08 LAB — BASIC METABOLIC PANEL
Anion gap: 7 (ref 5–15)
BUN: 7 mg/dL (ref 6–20)
CHLORIDE: 102 mmol/L (ref 101–111)
CO2: 27 mmol/L (ref 22–32)
CREATININE: 0.87 mg/dL (ref 0.61–1.24)
Calcium: 8.5 mg/dL — ABNORMAL LOW (ref 8.9–10.3)
GFR calc Af Amer: >60 mL/min (ref >60)
GFR calc non Af Amer: >60 mL/min (ref >60)
Glucose, Bld: 149 mg/dL — ABNORMAL HIGH (ref 65–99)
POTASSIUM: 4.2 mmol/L (ref 3.5–5.1)
Sodium: 136 mmol/L (ref 135–145)

## 2017-07-08 LAB — GLUCOSE, CAPILLARY
GLUCOSE-CAPILLARY: 128 mg/dL — AB (ref 65–99)
GLUCOSE-CAPILLARY: 117 mg/dL — AB (ref 65–99)
GLUCOSE-CAPILLARY: 134 mg/dL — AB (ref 65–99)
Glucose-Capillary: 161 mg/dL — ABNORMAL HIGH (ref 65–99)
Glucose-Capillary: 129 mg/dL — ABNORMAL HIGH (ref 65–99)

## 2017-07-08 MED ORDER — ENOXAPARIN SODIUM 40 MG/0.4ML ~~LOC~~ SOLN: 40.0000 mg | Freq: Every day | SUBCUTANEOUS | Status: DC

## 2017-07-08 MED ORDER — HYDROCORTISONE 1 % EX CREA
TOPICAL_CREAM | CUTANEOUS | Status: DC | PRN
  Administered 2017-07-09: via TOPICAL
  Filled 2017-07-08: qty 28

## 2017-07-08 NOTE — Progress Notes (Signed)
PROGRESS NOTE [facility]   [name]   YTW:[id number] DOB: Feb 22, 1966  DOA: 06/28/2017 PCP: [name], PA-C   Brief Narrative:  [name] is a 51 year old male with medical history of hypertension, diabetes, gout who was sent from [place] for management of intra-abdominal abscess.  CT scan was consistent with multiple liver abscesses and possible cholecystitis.  General surgery was consulted and recommended transfer to [facility] for further evaluation.  Patient was evaluated by IR who placed to percutaneous drain on the right upper quadrant 10/25.   Subjective: Patient seen and examined, some abdominal pain, remains afebrile, wbc trending down,   Assessment & Plan: Multiple intrahepatic abscesses, and concerns of cholecystitis due to strep bacteremia Status post IR percutaneous drainage x2 Discussed case with IR, there is 2 other abscesses that were not able to be drained, recommending to repeat CT abdomen with contrast on "Sunday 11/4. Plan for possible another drain placement on Monday.  Leukocytosis improving ID recommending Unasyn plus fluconazole for total of 4-week and repeat CT scan at that time. HIDA scan was negative for cholecystitis Continue pain medication as needed  Group F streptococcal bacteremia  Treatment per ID.  ECHO no vegetations  Follow up blood cultures, negative thus fay    Diarrhea - felt to be abx related  - resolved  C diff negative   DM Type 2 uncontrolled with hyperglycemia  CBG's more stable  A1C 13.4  Continue 70/30 Diabetic coordinator recommendations appreciated  Continue SSI  Continue to monitor   Leukocytosis and thrombocytosis  Felt to be reactive to infectious process, will check CBC in AM  Improving   Iron deficiency anemia  No overt bleeding, Iron supplement on hold due to bacteremia, will resume on upon discharge  Hgb stable    Physical deconditioning  PT recommending HH PT - improving   DVT  prophylaxis: lovenox  Code Status: Full  Family Communication: None at bedside  Disposition Plan: Will remain inpatient for possible procedure on Monday   Consultants:   IR  Surgery   ID   Procedures:   CT guided perc cath x 2  PICC R cephalic   Antimicrobials: Anti-infectives    Start     Dose/Rate Route Frequency Ordered Stop   07/05/17 1200  Ampicillin-Sulbactam (UNASYN) 3 g in sodium chloride 0.9 % 100 mL IVPB     3 g 200 mL/hr over 30 Minutes Intravenous Every 6 hours 07/05/17 1043     07/05/17 1000  fluconazole (DIFLUCAN) tablet 400 mg     400 mg Oral Daily 07/04/17 1445     07/03/17 1530  fluconazole (DIFLUCAN) tablet 200 mg  Status:  Discontinued     200 mg Oral Daily 07/03/17 1506 07/04/17 1445   06/30/17 2000  cefTRIAXone (ROCEPHIN) 2 g in dextrose 5 % 50 mL IVPB  Status:  Discontinued     2 g 100 mL/hr over 30 Minutes Intravenous Every 24 hours 06/30/17 1304 07/05/17 1043   06/30/17 2000  metroNIDAZOLE (FLAGYL) tablet 500 mg  Status:  Discontinued     50" 0 mg Oral Every 8 hours 06/30/17 1304 07/05/17 1043   06/30/17 0500  vancomycin (VANCOCIN) 1,500 mg in sodium chloride 0.9 % 500 mL IVPB  Status:  Discontinued     1,500 mg 250 mL/hr over 120 Minutes Intravenous Every 12 hours 06/29/17 1657 06/30/17 1304   06/29/17 1700  vancomycin (VANCOCIN) 2,500 mg in sodium chloride 0.9 % 500 mL IVPB  2,500 mg 250 mL/hr over 120 Minutes Intravenous  Once 06/29/17 1654 06/29/17 1900   06/29/17 0430  piperacillin-tazobactam (ZOSYN) IVPB 3.375 g  Status:  Discontinued     3.375 g 12.5 mL/hr over 240 Minutes Intravenous Every 8 hours 06/29/17 0042 06/30/17 1304   06/29/17 0013  metroNIDAZOLE (FLAGYL) IVPB 500 mg  Status:  Discontinued     500 mg 100 mL/hr over 60 Minutes Intravenous Every 8 hours 06/29/17 0013 06/29/17 1146   06/28/17 2100  metroNIDAZOLE (FLAGYL) IVPB 500 mg     500 mg 100 mL/hr over 60 Minutes Intravenous  Once 06/28/17 2049 06/28/17 2214   06/28/17 2015   piperacillin-tazobactam (ZOSYN) IVPB 3.375 g     3.375 g 100 mL/hr over 30 Minutes Intravenous  Once 06/28/17 2008 06/28/17 2101      Objective: Vitals:   07/07/17 2300 07/08/17 0100 07/08/17 0421 07/08/17 0736  BP:  (!) 147/78 127/79 125/72  Pulse: 94 90 89   Resp: 19 17 15 20   Temp: 98.2 F (36.8 C)  98.4 F (36.9 C) 97.9 F (36.6 C)  TempSrc: Oral  Oral Oral  SpO2: 99% 96% 97%   Weight:      Height:        Intake/Output Summary (Last 24 hours) at 07/08/17 0959 Last data filed at 07/08/17 0936  Gross per 24 hour  Intake              656 ml  Output             2475 ml  Net            -1819 ml   Filed Weights   06/28/17 1535 06/29/17 0403  Weight: (!) 170.1 kg (375 lb) (!) 170.5 kg (375 lb 12.8 oz)    Examination:  General: NAD Cardiovascular: RRR, S1/S2 +, no rubs, no gallops Respiratory: CTA bilaterally, no wheezing, no rhonchi Abdominal: Soft, NT, ND, + bowel sounds. Drain in place x 2 minimal serosanguineous discharge, incision clean, does not look infected.  Extremities: no edema.   Data Reviewed: I have personally reviewed following labs and imaging studies  CBC:  Recent Labs Lab 07/03/17 0235 07/04/17 0505 07/05/17 0605 07/06/17 0517 07/08/17 0541  WBC 20.9* 22.5* 24.4* 20.7* 17.7*  NEUTROABS  --   --  18.1*  --  12.4*  HGB 11.6* 11.2* 12.4* 11.5* 11.0*  HCT 36.5* 36.0* 39.0 36.1* 35.0*  MCV 82.2 82.0 81.9 82.0 82.4  PLT 557* 602* 645* 580* 382*   Basic Metabolic Panel:  Recent Labs Lab 07/02/17 0522 07/03/17 0235 07/05/17 0605 07/08/17 0541  NA 135 137 137 136  K 3.5 3.9 4.2 4.2  CL 101 102 103 102  CO2 23 26 26 27   GLUCOSE 221* 192* 156* 149*  BUN 7 5* 8 7  CREATININE 0.71 0.74 0.79 0.87  CALCIUM 8.4* 8.5* 8.7* 8.5*  MG  --   --  2.1  --   PHOS 3.5 4.4 4.0  --    GFR: Estimated Creatinine Clearance: 151.6 mL/min (by C-G formula based on SCr of 0.87 mg/dL). Liver Function Tests:  Recent Labs Lab 07/02/17 0522 07/03/17 0235  07/05/17 0605  AST 25  --  19  ALT 27  --  16*  ALKPHOS 337*  --  294*  BILITOT 0.9  --  1.0  PROT 7.8  --  7.4  ALBUMIN 1.6*  1.6* 1.6* 1.8*   No results for input(s): LIPASE, AMYLASE in the last 168  hours. No results for input(s): AMMONIA in the last 168 hours. Coagulation Profile: No results for input(s): INR, PROTIME in the last 168 hours. Cardiac Enzymes: No results for input(s): CKTOTAL, CKMB, CKMBINDEX, TROPONINI in the last 168 hours. BNP (last 3 results) No results for input(s): PROBNP in the last 8760 hours. HbA1C: No results for input(s): HGBA1C in the last 72 hours. CBG:  Recent Labs Lab 07/06/17 1946 07/07/17 0836 07/07/17 1210 07/07/17 1629 07/07/17 2026  GLUCAP 181* 140* 164* 138* 133*   Lipid Profile: No results for input(s): CHOL, HDL, LDLCALC, TRIG, CHOLHDL, LDLDIRECT in the last 72 hours. Thyroid Function Tests: No results for input(s): TSH, T4TOTAL, FREET4, T3FREE, THYROIDAB in the last 72 hours. Anemia Panel: No results for input(s): VITAMINB12, FOLATE, FERRITIN, TIBC, IRON, RETICCTPCT in the last 72 hours. Sepsis Labs: No results for input(s): PROCALCITON, LATICACIDVEN in the last 168 hours.  Recent Results (from the past 240 hour(s))  Urine culture     Status: None   Collection Time: 06/28/17  6:28 PM  Result Value Ref Range Status   Specimen Description URINE, RANDOM  Final   Special Requests NONE  Final   Culture   Final    NO GROWTH Performed at [facility], [address] [place], [place] [address]    Report Status 06/30/2017 FINAL  Final  Blood culture (routine x 2)     Status: Abnormal   Collection Time: 06/28/17  6:49 PM  Result Value Ref Range Status   Specimen Description RIGHT ANTECUBITAL  Final   Special Requests   Final    BOTTLES DRAWN AEROBIC AND ANAEROBIC Blood Culture adequate volume   Culture  Setup Time   Final    GRAM POSITIVE COCCI Gram Stain Report Called to,Read Back By and Verified With: ANGELICA DAVIS. @  1287 ON [id number] BY HENDERSON L. ANAEROBIC AEROBIC Gram Stain Report Called to,Read Back By and Verified With: MIZE,J @ 1930 ON 10.24.18 BY BOWMAN,L CRITICAL RESULT CALLED TO, READ BACK BY AND VERIFIED WITH: J.LEDFORD PHARMD 06/30/17 0207 L.CHAMPION Performed at [facility], [place] [address], [place], [place] [address]    Culture STREPTOCOCCUS GROUP F (A)  Final   Report Status 07/05/2017 FINAL  Final   Organism ID, Bacteria STREPTOCOCCUS GROUP F  Final      Susceptibility   Streptococcus group f - MIC*    CLINDAMYCIN RESISTANT Resistant     AMPICILLIN <=0.25 SENSITIVE Sensitive     VANCOMYCIN 0.5 SENSITIVE Sensitive     LEVOFLOXACIN <=0.25 SENSITIVE Sensitive     PENICILLIN Value in next row Sensitive      SENSITIVE0.12    * STREPTOCOCCUS GROUP F  Blood culture (routine x 2)     Status: Abnormal   Collection Time: 06/28/17  6:49 PM  Result Value Ref Range Status   Specimen Description BLOOD RIGHT HAND  Final   Special Requests   Final    BOTTLES DRAWN AEROBIC AND ANAEROBIC Blood Culture adequate volume   Culture  Setup Time   Final    GRAM POSITIVE COCCI Gram Stain Report Called to,Read Back By and Verified With: ANGELICA DAVIS @ 2836 ON [id number] BY HENDERSON L. ANAEROBIC AEROBIC Gram Stain Report Called to,Read Back By and Verified With: MIZE, J @ 1930 ON 10.24.18 BY BOWMAN,L    Culture (A)  Final    STREPTOCOCCUS GROUP F SUSCEPTIBILITIES PERFORMED ON PREVIOUS CULTURE WITHIN THE LAST 5 DAYS. Performed at [facility], [place]  [address] [place], [place] [address]    Report Status 07/03/2017 FINAL  Final  Blood Culture ID Panel (Reflexed)     Status: Abnormal   Collection Time: 06/28/17  6:49 PM  Result Value Ref Range Status   Enterococcus species NOT DETECTED NOT DETECTED Final   Listeria monocytogenes NOT DETECTED NOT DETECTED Final   Staphylococcus species NOT DETECTED NOT DETECTED Final   Staphylococcus aureus NOT DETECTED NOT DETECTED Final   Streptococcus  species DETECTED (A) NOT DETECTED Final    Comment: Not Enterococcus species, Streptococcus agalactiae, Streptococcus pyogenes, or Streptococcus pneumoniae. CRITICAL RESULT CALLED TO, READ BACK BY AND VERIFIED WITH: J.LEDFORD PHARMD 06/30/17 0207 L.CHAMPION    Streptococcus agalactiae NOT DETECTED NOT DETECTED Final   Streptococcus pneumoniae NOT DETECTED NOT DETECTED Final   Streptococcus pyogenes NOT DETECTED NOT DETECTED Final   Acinetobacter baumannii NOT DETECTED NOT DETECTED Final   Enterobacteriaceae species NOT DETECTED NOT DETECTED Final   Enterobacter cloacae complex NOT DETECTED NOT DETECTED Final   Escherichia coli NOT DETECTED NOT DETECTED Final   Klebsiella oxytoca NOT DETECTED NOT DETECTED Final   Klebsiella pneumoniae NOT DETECTED NOT DETECTED Final   Proteus species NOT DETECTED NOT DETECTED Final   Serratia marcescens NOT DETECTED NOT DETECTED Final   Haemophilus influenzae NOT DETECTED NOT DETECTED Final   Neisseria meningitidis NOT DETECTED NOT DETECTED Final   Pseudomonas aeruginosa NOT DETECTED NOT DETECTED Final   Candida albicans NOT DETECTED NOT DETECTED Final   Candida glabrata NOT DETECTED NOT DETECTED Final   Candida krusei NOT DETECTED NOT DETECTED Final   Candida parapsilosis NOT DETECTED NOT DETECTED Final   Candida tropicalis NOT DETECTED NOT DETECTED Final    Comment: Performed at [facility], [place] [address], [place], [place] [address]  MRSA PCR Screening     Status: None   Collection Time: 06/29/17  4:12 AM  Result Value Ref Range Status   MRSA by PCR NEGATIVE NEGATIVE Final    Comment:        The GeneXpert MRSA Assay (FDA approved for NASAL specimens only), is one component of a comprehensive MRSA colonization surveillance program. It is not intended to diagnose MRSA infection nor to guide or monitor treatment for MRSA infections.   Culture, blood (routine x 2)     Status: None   Collection Time: 06/29/17  6:58 PM  Result Value Ref  Range Status   Specimen Description BLOOD RIGHT HAND  Final   Special Requests   Final    BOTTLES DRAWN AEROBIC ONLY Blood Culture adequate volume   Culture NO GROWTH 5 DAYS  Final   Report Status 07/04/2017 FINAL  Final  Culture, blood (routine x 2)     Status: None   Collection Time: 06/29/17  7:09 PM  Result Value Ref Range Status   Specimen Description BLOOD RIGHT ANTECUBITAL  Final   Special Requests   Final    BOTTLES DRAWN AEROBIC AND ANAEROBIC Blood Culture adequate volume   Culture NO GROWTH 5 DAYS  Final   Report Status 07/04/2017 FINAL  Final  Aerobic/Anaerobic Culture (surgical/deep wound)     Status: None   Collection Time: 06/30/17  1:31 PM  Result Value Ref Range Status   Specimen Description ABSCESS  Final   Special Requests HEPATIC ABSCESS DRAINAGE  Final   Gram Stain   Final    ABUNDANT WBC PRESENT, PREDOMINANTLY PMN ABUNDANT GRAM POSITIVE COCCI ABUNDANT GRAM POSITIVE RODS    Culture   Final  FEW CANDIDA ALBICANS MODERATE STREPTOCOCCUS GROUP C MODERATE STREPTOCOCCUS GROUP F NO ANAEROBES ISOLATED    Report Status 07/05/2017 FINAL  Final  C difficile quick scan w PCR reflex     Status: None   Collection Time: 07/01/17  8:00 PM  Result Value Ref Range Status   C Diff antigen NEGATIVE NEGATIVE Final   C Diff toxin NEGATIVE NEGATIVE Final   C Diff interpretation No C. difficile detected.  Final  Culture, blood (routine x 2)     Status: None (Preliminary result)   Collection Time: 07/05/17  4:52 PM  Result Value Ref Range Status   Specimen Description BLOOD LEFT HAND  Final   Special Requests IN PEDIATRIC BOTTLE Blood Culture adequate volume  Final   Culture NO GROWTH 2 DAYS  Final   Report Status PENDING  Incomplete  Culture, blood (routine x 2)     Status: None (Preliminary result)   Collection Time: 07/05/17  4:55 PM  Result Value Ref Range Status   Specimen Description BLOOD LEFT ARM  Final   Special Requests IN PEDIATRIC BOTTLE Blood Culture adequate  volume  Final   Culture NO GROWTH 2 DAYS  Final   Report Status PENDING  Incomplete      Radiology Studies: No results found.    Scheduled Meds: . enoxaparin (LOVENOX) injection  40 mg Subcutaneous Daily  . fluconazole  400 mg Oral Daily  . insulin aspart  0-9 Units Subcutaneous TID WC  . insulin aspart protamine- aspart  10 Units Subcutaneous BID WC  . lactobacillus acidophilus  2 tablet Oral TID  . loratadine  10 mg Oral Daily  . pantoprazole  40 mg Oral Daily  . sodium chloride flush  10-40 mL Intracatheter Q12H  . sodium chloride flush  5 mL Intravenous Q8H   Continuous Infusions: . ampicillin-sulbactam (UNASYN) IV Stopped (07/08/17 0623)     LOS: 10 days    Time spent: Total of 25 minutes spent with pt, greater than 50% of which was spent in discussion of  treatment, counseling and coordination of care    Chipper Oman, MD Pager: Text Page via [email or web address]   If 7PM-7AM, please contact night-coverage [email or web address] 07/08/2017, 9:59 AM

## 2017-07-08 NOTE — Consult Note (Signed)
Chief Complaint: Patient was seen in consultation today for abdominal abscess (es) drain placement Chief Complaint  Patient presents with  . Abnormal Lab   at the request of Dr Patrecia Pour  Referring Physician(s): Dr Jake Samples  Supervising Physician: Sandi Mariscal  Patient Status: [facility] - In-pt  History of Present Illness: [name] is a 51 y.o. male   Liver abscesses Drains placed 10/25 OP minimal CT 10/30: IMPRESSION: 1. Abscesses associated with drainage catheters in right lobe of liver are decreased in size with minimal if any residual fluid. 2. Increased size and organization of 4 subcapsular abscesses in the right inferior lobe of the liver. 3. Increased size of abscess in the gallbladder fossa.  Dr Pascal Lux has discussed case with [facility] MD Plan for CT Abd IV only Sunday 11/4 to fully delineate hepatic and GB foss abscesses Plan for attempt at possible 2 new drains 11/5 Initial drains to remain for now per Dr Pascal Lux  Pt is aware of plan and agreeable  Past Medical History:  Diagnosis Date  . Arthritis   . Diabetes mellitus without complication ([place])    diagnosed about age 67  . Gout   . Hypertension    boarderline    Past Surgical History:  Procedure Laterality Date  . HERNIA REPAIR  2008  . MASS EXCISION Left age 72   L thigh- benign    Allergies: Patient has no known allergies.  Medications: Prior to Admission medications   Medication Sig Start Date End Date Taking? Authorizing Provider  oxyCODONE-acetaminophen (PERCOCET) 10-325 MG tablet Take 1 tablet by mouth every 4 (four) hours as needed for pain. 06/15/17  Yes Sanjuana Kava, MD  albuterol (PROVENTIL HFA;VENTOLIN HFA) 108 (90 Base) MCG/ACT inhaler Inhale 2 puffs into the lungs every 6 (six) hours as needed for wheezing or shortness of breath. 06/28/17   [name], PA-C  benzonatate (TESSALON PERLES) 100 MG capsule 1-2 po q 8 hour prn cough Patient taking differently: Take 100-200 mg by  mouth 3 (three) times daily as needed for cough. 1-2 po q 8 hour prn cough 06/28/17   [name], PA-C  cephALEXin (KEFLEX) 500 MG capsule Take 1 capsule (500 mg total) by mouth 4 (four) times daily. 06/28/17   [name], PA-C  metFORMIN (GLUCOPHAGE) 1000 MG tablet Take 1 tablet (1,000 mg total) by mouth 2 (two) times daily with a meal. 06/28/17   [name], PA-C     Family History  Problem Relation Age of Onset  . Cancer Mother   . Diabetes Mother   . Heart failure Father   . Hypertension Father   . Diabetes Father     Social History   Social History  . Marital status: Single    Spouse name: N/A  . Number of children: N/A  . Years of education: N/A   Social History Main Topics  . Smoking status: Former Smoker    Packs/day: 0.50    Years: 5.00    Types: Cigarettes    Quit date: 55  . Smokeless tobacco: Never Used  . Alcohol use No  . Drug use: No  . Sexual activity: Not Asked   Other Topics Concern  . None   Social History Narrative  . None     Review of Systems: A 12 point ROS discussed and pertinent positives are indicated in the HPI above.  All other systems are negative.  Review of Systems  Constitutional: Positive for fatigue. Negative for activity change and  fever.  Cardiovascular: Negative for chest pain.  Gastrointestinal: Positive for abdominal pain.  Neurological: Positive for weakness.  Psychiatric/Behavioral: Negative for behavioral problems and confusion.    Vital Signs: BP 135/81 (BP Location: Left Wrist)   Pulse 92   Temp 98.9 F (37.2 C) (Oral)   Resp 18   Ht 5\' 7"  (1.702 m)   Wt (!) 375 lb 12.8 oz (170.5 kg)   SpO2 99%   BMI 58.86 kg/m   Physical Exam  Constitutional: He is oriented to person, place, and time.  Cardiovascular: Normal rate and regular rhythm.   Pulmonary/Chest: Effort normal and breath sounds normal.  Abdominal: Soft. There is tenderness.  Musculoskeletal: Normal range of motion.  Neurological:  He is alert and oriented to person, place, and time.  Skin: Skin is warm and dry.  Psychiatric: He has a normal mood and affect. His behavior is normal. Judgment and thought content normal.  Nursing note and vitals reviewed.   Imaging: Dg Chest 2 View  Result Date: 06/28/2017 CLINICAL DATA:  Shortness of breath.  Cough . EXAM: CHEST  2 VIEW COMPARISON:  06/10/2017. FINDINGS: Mediastinum hilar structures normal. Cardiomegaly with mild pulmonary vascular congestion. No overt pulmonary edema. Mild left base subsegmental atelectasis. No prominent pleural effusion. Pleural thickening noted bilaterally unchanged consistent scarring . IMPRESSION: 1. Cardiomegaly with mild pulmonary venous congestion. 2. Mild left base subsegmental atelectasis. Electronically Signed   By: Marcello Moores  Register   On: 06/28/2017 14:29   Dg Chest 2 View  Result Date: 06/10/2017 CLINICAL DATA:  Cough and congestion. EXAM: CHEST  2 VIEW COMPARISON:  Two-view chest x-ray 12/03/2015 FINDINGS: The heart size is exaggerated by low lung volumes. The visualized soft tissues and bony thorax are unremarkable. The lungs are poorly inflated on the lateral view. The visualized soft tissues and bony thorax are on remarkable. IMPRESSION: 1. Low lung volumes. 2. No acute cardiopulmonary disease peer Electronically Signed   By: San Morelle M.D.   On: 06/10/2017 14:52   Ct Abdomen Pelvis W Contrast  Result Date: 07/05/2017 CLINICAL DATA:  51 y/o M; abdominal infections with worsening leukocytosis. Image guided drainage with catheter 5 days ago. EXAM: CT ABDOMEN AND PELVIS WITH CONTRAST TECHNIQUE: Multidetector CT imaging of the abdomen and pelvis was performed using the standard protocol following bolus administration of intravenous contrast. CONTRAST:  100 cc Isovue-300 COMPARISON:  None. FINDINGS: Lower chest: No acute abnormality. Hepatobiliary: 2 percutaneous drainage catheters are present within abscess in the right lobe of the  liver. The abscess with catheters are decreased in size from the prior CT with minimal if any residual fluid collection. Irregular abscess with multiple small satellite foci within the dome of the liver spanning approximately 4.3 cm is stable from prior CT. Multiple abscesses within the right inferior lobe of the liver along the capsule are decreased in attenuation and increased in size indicating increased organization and fluid components measuring 1.8, 1.9, 3.4, and 4.5 cm (series 3, image 41, 43, 48). Abscess within the gallbladder fossa is increased in size measuring 8.8 x 4.9 cm in axial plane (AP by ML series 3, image 34). The gallbladder is compressed by the gallbladder fossa abscess. Pancreas: Unremarkable. No pancreatic ductal dilatation or surrounding inflammatory changes. Spleen: Normal in size without focal abnormality. Adrenals/Urinary Tract: Adrenal glands are unremarkable. Kidneys are normal, without renal calculi, focal lesion, or hydronephrosis. Bladder is unremarkable. Stomach/Bowel: Stomach is within normal limits. Appendix appears normal. No evidence of bowel wall thickening, distention, or inflammatory changes.  Vascular/Lymphatic: No significant vascular findings are present. No enlarged abdominal or pelvic lymph nodes. Reproductive: Prostate is unremarkable. Other: Stable paraumbilical hernia containing fat. Musculoskeletal: No fracture is seen. IMPRESSION: 1. Abscesses associated with drainage catheters in right lobe of liver are decreased in size with minimal if any residual fluid. 2. Increased size and organization of 4 subcapsular abscesses in the right inferior lobe of the liver. 3. Increased size of abscess in the gallbladder fossa. Electronically Signed   By: Kristine Garbe M.D.   On: 07/05/2017 21:07   Ct Abdomen Pelvis W Contrast  Result Date: 06/28/2017 CLINICAL DATA:  Initial evaluation for acute abdominal infection, right upper quadrant tenderness. EXAM: CT ABDOMEN  AND PELVIS WITH CONTRAST TECHNIQUE: Multidetector CT imaging of the abdomen and pelvis was performed using the standard protocol following bolus administration of intravenous contrast. CONTRAST:  166mL ISOVUE-300 IOPAMIDOL (ISOVUE-300) INJECTION 61% COMPARISON:  Prior ultrasound 08/31/2016. FINDINGS: Lower chest: Scattered bibasilar atelectatic changes. Visualized lungs otherwise clear. Hepatobiliary: Multiple ill-defined hypodense lesions seen within the liver, predominantly involving the right hepatic lobe. These are predominantly subcapsular in location. For reference purposes, a discrete lesion positioned within the right hepatic lobe measures approximately 4.0 x 3.7 cm in size (series 2, image 40). Associated bulging of the hepatic contour at a few areas with prominent perihepatic inflammatory stranding. Findings favored to reflect intrahepatic abscess ease, although metastatic disease could also be considered. Additionally, inflammatory stranding seen about the gallbladder in the gallbladder fossa. The gallbladder itself appears to be partially contracted. There is an adjacent oblong collection/lesion that measures approximately 8.2 x 3.5 x 5.8 cm (series 2, image 42), positioned adjacent to the gallbladder. This appears to be contiguous with several of the intrahepatic lesions (series 6, image 51, 46, 43). Finding also favored to reflect abscess, although mass or adenopathy could be considered as well. Hazy inflammatory stranding extends inferiorly and laterally about the liver. No appreciable biliary dilatation. Pancreas: Pancreas within normal limits. Spleen: Spleen within normal limits. Adrenals/Urinary Tract: Adrenal glands are normal. Kidneys equal in size with symmetric enhancement. Probable punctate nonobstructive left renal nephrolithiasis noted. No hydronephrosis or focal enhancing renal mass. No hydroureter. Partially distended bladder within normal limits. Stomach/Bowel: Stomach within normal  limits. Hazy inflammatory stranding about the second- third portion of the duodenum related to the inflammatory process about the gallbladder an liver. No evidence for bowel obstruction. Appendix is normal. No other acute inflammatory changes about the bowels. Specifically, no or significant inflammatory changes about the bowels seen to serve as a source for intrahepatic seeding. Vascular/Lymphatic: Normal intravascular enhancement seen throughout the intra-abdominal aorta and its branch vessels. No adenopathy. Reproductive: Prostate normal. Other: No free intraperitoneal air. No free fluid. Sequelae of prior ventral hernia repair noted. There is a recurrent mildly complex fat containing ventral hernia at the lower margin of the mesh. Loop of small bowel closely approximates the hernia neck. Musculoskeletal: No acute osseus abnormality. No worrisome lytic or blastic osseous lesions. IMPRESSION: 1. Multiple abnormal intrahepatic lesions as above with adjacent perihepatic inflammatory stranding. Given the adjacent inflammatory changes, findings favored to reflect sequelae of infection with intrahepatic abscesses. Exact source not definitively identified on this exam, as no significant inflammatory changes are seen about the bowels themselves. Metastatic disease could also be considered, although is perhaps less favored. Correlation with percutaneous sampling would likely be helpful for further evaluation. 2. Additional 8.2 x 3.5 x 5.8 cm collection/lesion adjacent to the gallbladder as above, favored to be contiguous with a few of  the peripheral intrahepatic lesions. Finding also favored to reflect abscess. The adjacent gallbladder is contracted with hazy inflammatory stranding in the gallbladder fossa. Under clear whether these inflammatory changes are related to adjacent infection/abscess or possibly concomitant cholecystitis. No biliary dilatation identified. 3. Sequelae of prior hernia repair with recurrent fat  containing ventral hernia. Electronically Signed   By: Jeannine Boga M.D.   On: 06/28/2017 20:39   Nm Hepato W/eject Fract  Result Date: 07/05/2017 CLINICAL DATA:  Abdominal pain. EXAM: NUCLEAR MEDICINE HEPATOBILIARY IMAGING WITH GALLBLADDER EF TECHNIQUE: Sequential images of the abdomen were obtained out to 60 minutes following intravenous administration of radiopharmaceutical. After oral ingestion of Ensure, gallbladder ejection fraction was determined. At 60 min, normal ejection fraction is greater than 33%. RADIOPHARMACEUTICALS:  5.0 mCi Tc-27m  Choletec IV COMPARISON:  CT 06/30/2017. FINDINGS: Prompt uptake and biliary excretion of activity by the liver is seen. Small photopenic area in the right lobe liver most likely related to a previously identified abscess. Gallbladder activity is visualized, consistent with patency of cystic duct. Biliary activity passes into small bowel, consistent with patent common bile duct. Calculated gallbladder ejection fraction is 61%. (Normal gallbladder ejection fraction with Ensure is greater than 33%.) IMPRESSION: Normal visualization of the gallbladder. Normal gallbladder ejection fraction. Electronically Signed   By: Marcello Moores  Register   On: 07/05/2017 13:59   Ct Image Guided Drainage By Percutaneous Catheter  Result Date: 06/30/2017 INDICATION: History of hepatic abscesses. Please perform ultrasound/CT-guided hepatic abscess drainage catheter placement. EXAM: ULTRASOUND AND CT-GUIDED HEPATIC ABSCESS DRAINAGE CATHETER PLACEMENT x2 COMPARISON:  CT abdomen pelvis - 06/28/2017 MEDICATIONS: The patient is currently admitted to the hospital and receiving intravenous antibiotics. The antibiotics were administered within an appropriate time frame prior to the initiation of the procedure. ANESTHESIA/SEDATION: Moderate (conscious) sedation was employed during this procedure. A total of Versed 7 mg and Fentanyl 400 mcg, Dilaudid 2 mg IV and Demerol 50 mg IV was  administered intravenously. Moderate Sedation Time: 25 minutes. The patient's level of consciousness and vital signs were monitored continuously by radiology nursing throughout the procedure under my direct supervision. CONTRAST:  None COMPLICATIONS: SIR LEVEL B - Normal therapy, includes overnight admission for observation. The patient developed rigors following placement of both hepatic abscess drainage catheters which was treated with the administration of 50 mg of Demerol IV. PROCEDURE: Informed written consent was obtained from the patient after a discussion of the risks, benefits and alternatives to treatment. The patient was placed supine on the CT gantry and a pre procedural CT was performed re-demonstrating the known hepatic abscesses with dominant ill-defined component within the superolateral aspect the right lobe of the liver measuring approximately 5.3 x 4.9 cm (image 40, series 2), additional ill-defined component with the subcapsular inferior aspect of the right lobe of the liver measuring approximately 5.2 x 4.4 cm (image 63, series 2) an ill-defined slightly hyperattenuating fluid collection within the porta hepatis measuring approximately 8.5 x 4.4 cm (image 66, series 2). All 3 of these fluid collections were identified sonographically. The right superolateral abdomen as well as the midline of the upper abdomen was prepped and draped in usual sterile fashion. The overlying soft tissues were anesthetized with 1% lidocaine with epinephrine. Under direct ultrasound guidance, each hypoechoic collection were sequentially accessed with an 18 gauge trocar needle. Both hepatic samples yielded the aspiration of a small amount of purulent fluid, while the porta hepatis collection yielded a small amount of bilious appearing fluid. Short Amplatz wires were placed at all  locations. CT scanning demonstrated appropriate positioning of the Amplatz wires within both hepatic abscesses, however the wire was not  appropriately coiled within the porta hepatis collection. Next, both hepatic abscess drainage catheters were placed after the tracks were serially dilated allowing placement of a 10 [place] all-purpose drainage catheters. As patient was complaining of discomfort lying on the table and then developed rigors, additional intervention was not performed at this time to attempt placement of a drainage catheter into the perihepatic abscess. Limited postprocedural scanning was performed demonstrating appropriate positioning of both hepatic abscess drainage catheters. Approximately 15 cc of purulent fluid was aspirated from the superolateral abscess while approximately 35 cc was aspirated from the abscess within the anterior aspect the right lobe of the liver. Both drainage catheters were secured at the skin entrance site within interrupted suture and connected to JP bulbs. Dressings were placed. IMPRESSION: 1. Successful ultrasound and CT-guided drainage catheter placement into dominant abscess within the superolateral aspect the right lobe of the liver yielding 15 cc of purulent material. 2. Successful ultrasound and CT-guided drainage catheter placement into dominant abscess within the anterior inferior aspect the right lobe of the liver yielding 35 cc of purulent material. 3. Attempted though ultimately unsuccessful placement of a percutaneous drainage catheter into the ill-defined suspected abscess within the porta hepatis abscess, secondary to patient's inability to tolerate the procedure as well as development of rigors following placement of the 2 additional hepatic abscess drainage catheters. 4. A representative sample of aspirated purulent material was sent to the laboratory for analysis. PLAN: - As the suspected abscess within the dome of the right lobe of the liver and potential abscess within the porta hepatis remain undrained, would have a low threshold to repeat an IV only abdominal CT if patient has infectious  symptoms do not defervesce within the next several days, however note, both collections may be NOT amenable to image guided drainage secondary to location and patient body habitus. Electronically Signed   By: Sandi Mariscal M.D.   On: 06/30/2017 14:55   Ct Image Guided Drainage By Percutaneous Catheter  Result Date: 06/30/2017 INDICATION: History of hepatic abscesses. Please perform ultrasound/CT-guided hepatic abscess drainage catheter placement. EXAM: ULTRASOUND AND CT-GUIDED HEPATIC ABSCESS DRAINAGE CATHETER PLACEMENT x2 COMPARISON:  CT abdomen pelvis - 06/28/2017 MEDICATIONS: The patient is currently admitted to the hospital and receiving intravenous antibiotics. The antibiotics were administered within an appropriate time frame prior to the initiation of the procedure. ANESTHESIA/SEDATION: Moderate (conscious) sedation was employed during this procedure. A total of Versed 7 mg and Fentanyl 400 mcg, Dilaudid 2 mg IV and Demerol 50 mg IV was administered intravenously. Moderate Sedation Time: 25 minutes. The patient's level of consciousness and vital signs were monitored continuously by radiology nursing throughout the procedure under my direct supervision. CONTRAST:  None COMPLICATIONS: SIR LEVEL B - Normal therapy, includes overnight admission for observation. The patient developed rigors following placement of both hepatic abscess drainage catheters which was treated with the administration of 50 mg of Demerol IV. PROCEDURE: Informed written consent was obtained from the patient after a discussion of the risks, benefits and alternatives to treatment. The patient was placed supine on the CT gantry and a pre procedural CT was performed re-demonstrating the known hepatic abscesses with dominant ill-defined component within the superolateral aspect the right lobe of the liver measuring approximately 5.3 x 4.9 cm (image 40, series 2), additional ill-defined component with the subcapsular inferior aspect of the  right lobe of the liver measuring approximately  5.2 x 4.4 cm (image 63, series 2) an ill-defined slightly hyperattenuating fluid collection within the porta hepatis measuring approximately 8.5 x 4.4 cm (image 66, series 2). All 3 of these fluid collections were identified sonographically. The right superolateral abdomen as well as the midline of the upper abdomen was prepped and draped in usual sterile fashion. The overlying soft tissues were anesthetized with 1% lidocaine with epinephrine. Under direct ultrasound guidance, each hypoechoic collection were sequentially accessed with an 18 gauge trocar needle. Both hepatic samples yielded the aspiration of a small amount of purulent fluid, while the porta hepatis collection yielded a small amount of bilious appearing fluid. Short Amplatz wires were placed at all locations. CT scanning demonstrated appropriate positioning of the Amplatz wires within both hepatic abscesses, however the wire was not appropriately coiled within the porta hepatis collection. Next, both hepatic abscess drainage catheters were placed after the tracks were serially dilated allowing placement of a 10 [place] all-purpose drainage catheters. As patient was complaining of discomfort lying on the table and then developed rigors, additional intervention was not performed at this time to attempt placement of a drainage catheter into the perihepatic abscess. Limited postprocedural scanning was performed demonstrating appropriate positioning of both hepatic abscess drainage catheters. Approximately 15 cc of purulent fluid was aspirated from the superolateral abscess while approximately 35 cc was aspirated from the abscess within the anterior aspect the right lobe of the liver. Both drainage catheters were secured at the skin entrance site within interrupted suture and connected to JP bulbs. Dressings were placed. IMPRESSION: 1. Successful ultrasound and CT-guided drainage catheter placement into  dominant abscess within the superolateral aspect the right lobe of the liver yielding 15 cc of purulent material. 2. Successful ultrasound and CT-guided drainage catheter placement into dominant abscess within the anterior inferior aspect the right lobe of the liver yielding 35 cc of purulent material. 3. Attempted though ultimately unsuccessful placement of a percutaneous drainage catheter into the ill-defined suspected abscess within the porta hepatis abscess, secondary to patient's inability to tolerate the procedure as well as development of rigors following placement of the 2 additional hepatic abscess drainage catheters. 4. A representative sample of aspirated purulent material was sent to the laboratory for analysis. PLAN: - As the suspected abscess within the dome of the right lobe of the liver and potential abscess within the porta hepatis remain undrained, would have a low threshold to repeat an IV only abdominal CT if patient has infectious symptoms do not defervesce within the next several days, however note, both collections may be NOT amenable to image guided drainage secondary to location and patient body habitus. Electronically Signed   By: Sandi Mariscal M.D.   On: 06/30/2017 14:55    Labs:  CBC:  Recent Labs  07/04/17 0505 07/05/17 0605 07/06/17 0517 07/08/17 0541  WBC 22.5* 24.4* 20.7* 17.7*  HGB 11.2* 12.4* 11.5* 11.0*  HCT 36.0* 39.0 36.1* 35.0*  PLT 602* 645* 580* 535*    COAGS:  Recent Labs  06/30/17 0709  INR 1.24    BMP:  Recent Labs  07/02/17 0522 07/03/17 0235 07/05/17 0605 07/08/17 0541  NA 135 137 137 136  K 3.5 3.9 4.2 4.2  CL 101 102 103 102  CO2 23 26 26 27   GLUCOSE 221* 192* 156* 149*  BUN 7 5* 8 7  CALCIUM 8.4* 8.5* 8.7* 8.5*  CREATININE 0.71 0.74 0.79 0.87  GFRNONAA >60 >60 >60 >60  GFRAA >60 >60 >60 >60  LIVER FUNCTION TESTS:  Recent Labs  06/28/17 1232 06/30/17 0709 07/02/17 0522 07/03/17 0235 07/05/17 0605  BILITOT 3.3* 1.7*  0.9  --  1.0  AST 115* 38 25  --  19  ALT 73* 40 27  --  16*  ALKPHOS 474* 406* 337*  --  294*  PROT 7.9 7.5 7.8  --  7.4  ALBUMIN 2.2* 1.7* 1.6*  1.6* 1.6* 1.8*    TUMOR MARKERS: No results for input(s): AFPTM, CEA, CA199, CHROMGRNA in the last 8760 hours.  Assessment and Plan:  Hepatic abscess drains placed 10/25 Resolving---drains to remain for now New abscesses noted on 10/30 CT Dr Pascal Lux rec: re CT Sun 11/4----plan for possible 2 new drain placements 11/5 All orders in place Pt is aware Risks and benefits discussed with the patient including bleeding, infection, damage to adjacent structures, bowel perforation/fistula connection, and sepsis. All of the patient's questions were answered, patient is agreeable to proceed. Consent signed and in chart.   Thank you for this interesting consult.  I greatly enjoyed meeting CAMILA NORVILLE and look forward to participating in their care.  A copy of this report was sent to the requesting provider on this date.  Electronically Signed: Lavonia Drafts, PA-C 07/08/2017, 11:46 AM   I spent a total of 40 Minutes    in face to face in clinical consultation, greater than 50% of which was counseling/coordinating care for abscess drain(s) placement 11/5

## 2017-07-08 NOTE — Progress Notes (Signed)
[facility] Progress Note     Subjective: CC:  Upset he isnt going home but understands why he has to stay - IR wants to repeat CT on Monday. Tolerating PO but doesn't like the food he is getting. Still have mild pain around drains. +flatus and BMs. Denies fever/chills.  WBC 17.7 from 20.7, afebrile   Objective: Vital signs in last 24 hours: Temp:  [97.9 F (36.6 C)-99 F (37.2 C)] 97.9 F (36.6 C) (11/02 0736) Pulse Rate:  [89-104] 89 (11/02 0421) Resp:  [15-20] 20 (11/02 0736) BP: (125-150)/(72-103) 125/72 (11/02 0736) SpO2:  [96 %-99 %] 97 % (11/02 0421) Last BM Date: 07/06/17  Intake/Output from previous day: 11/01 0701 - 11/02 0700 In: 646 [P.O.:236; IV Piggyback:400] Out: 2100 [Urine:2100] Intake/Output this shift: Total I/O In: 10 [I.V.:10] Out: 375 [Urine:375]  PE: Gen:  Alert, NAD, pleasant and cooperative  Pulm:  Normal effort, clear to auscultation bilaterally Abd: Soft, minimally tender to palpation right hemiabdomen and epigastrium without peritonitis, drains sites c/d/i with minimal purulent output.              No drain output documented past 24h  Lab Results:   Recent Labs  07/06/17 0517 07/08/17 0541  WBC 20.7* 17.7*  HGB 11.5* 11.0*  HCT 36.1* 35.0*  PLT 580* 535*   BMET  Recent Labs  07/08/17 0541  NA 136  K 4.2  CL 102  CO2 27  GLUCOSE 149*  BUN 7  CREATININE 0.87  CALCIUM 8.5*   PT/INR No results for input(s): LABPROT, INR in the last 72 hours. CMP     Component Value Date/Time   NA 136 07/08/2017 0541   K 4.2 07/08/2017 0541   CL 102 07/08/2017 0541   CO2 27 07/08/2017 0541   GLUCOSE 149 (H) 07/08/2017 0541   BUN 7 07/08/2017 0541   CREATININE 0.87 07/08/2017 0541   CALCIUM 8.5 (L) 07/08/2017 0541   PROT 7.4 07/05/2017 0605   ALBUMIN 1.8 (L) 07/05/2017 0605   AST 19 07/05/2017 0605   ALT 16 (L) 07/05/2017 0605   ALKPHOS 294 (H) 07/05/2017 0605   BILITOT 1.0 07/05/2017 0605   GFRNONAA >60 07/08/2017  0541   GFRAA >60 07/08/2017 0541   Lipase     Component Value Date/Time   LIPASE 20 06/28/2017 1849       Studies/Results: No results found.  Anti-infectives: Anti-infectives    Start     Dose/Rate Route Frequency Ordered Stop   07/05/17 1200  Ampicillin-Sulbactam (UNASYN) 3 g in sodium chloride 0.9 % 100 mL IVPB     3 g 200 mL/hr over 30 Minutes Intravenous Every 6 hours 07/05/17 1043     07/05/17 1000  fluconazole (DIFLUCAN) tablet 400 mg     400 mg Oral Daily 07/04/17 1445     07/03/17 1530  fluconazole (DIFLUCAN) tablet 200 mg  Status:  Discontinued     200 mg Oral Daily 07/03/17 1506 07/04/17 1445   06/30/17 2000  cefTRIAXone (ROCEPHIN) 2 g in dextrose 5 % 50 mL IVPB  Status:  Discontinued     2 g 100 mL/hr over 30 Minutes Intravenous Every 24 hours 06/30/17 1304 07/05/17 1043   06/30/17 2000  metroNIDAZOLE (FLAGYL) tablet 500 mg  Status:  Discontinued     500 mg Oral Every 8 hours 06/30/17 1304 07/05/17 1043   06/30/17 0500  vancomycin (VANCOCIN) 1,500 mg in sodium chloride 0.9 % 500 mL IVPB  Status:  Discontinued  1,500 mg 250 mL/hr over 120 Minutes Intravenous Every 12 hours 06/29/17 1657 06/30/17 1304   06/29/17 1700  vancomycin (VANCOCIN) 2,500 mg in sodium chloride 0.9 % 500 mL IVPB     2,500 mg 250 mL/hr over 120 Minutes Intravenous  Once 06/29/17 1654 06/29/17 1900   06/29/17 0430  piperacillin-tazobactam (ZOSYN) IVPB 3.375 g  Status:  Discontinued     3.375 g 12.5 mL/hr over 240 Minutes Intravenous Every 8 hours 06/29/17 0042 06/30/17 1304   06/29/17 0013  metroNIDAZOLE (FLAGYL) IVPB 500 mg  Status:  Discontinued     500 mg 100 mL/hr over 60 Minutes Intravenous Every 8 hours 06/29/17 0013 06/29/17 1146   06/28/17 2100  metroNIDAZOLE (FLAGYL) IVPB 500 mg     500 mg 100 mL/hr over 60 Minutes Intravenous  Once 06/28/17 2049 06/28/17 2214   06/28/17 2015  piperacillin-tazobactam (ZOSYN) IVPB 3.375 g     3.375 g 100 mL/hr over 30 Minutes Intravenous  Once  06/28/17 2008 06/28/17 2101     Assessment/Plan Liver abscess - HIDA negative for cholecystitis. IR drain x 2 06/30/17 (CX: strep +, candida); Repeat CT with enlargement perihepatic fluid collections not amenable to IR drainage. Continue non-surgical management with IV abx per ID. streptococcal bacteremia - Repeat cultures negative to date; no vegetations on echo. abx per ID Diabetes mellitus -A1c 13.4 HTN  Obestiy - BMI is 58.8  FEN: No IV fluids/Carb Mod VTE: SCDs, lovenox  ID: Zosyn (10/23-10/25), Vanc (10/24-10/25), Flagyl (10/23>>), Ceftriaxone (10/25-10/30), PO fluconazole 10/28 >> , Unasyn IV 10/30 >>   LOS: 10 days    Jill Alexanders , [facility] 07/08/2017, 9:42 AM Pager: [phone] Consults: [phone] Mon-Fri 7:00 am-4:30 pm Sat-Sun 7:00 am-11:30 am

## 2017-07-09 LAB — GLUCOSE, CAPILLARY
GLUCOSE-CAPILLARY: 130 mg/dL — AB (ref 65–99)
GLUCOSE-CAPILLARY: 116 mg/dL — AB (ref 65–99)
Glucose-Capillary: 165 mg/dL — ABNORMAL HIGH (ref 65–99)
Glucose-Capillary: 150 mg/dL — ABNORMAL HIGH (ref 65–99)

## 2017-07-09 MED ORDER — ENOXAPARIN SODIUM 40 MG/0.4ML ~~LOC~~ SOLN: 40.0000 mg | Freq: Every day | SUBCUTANEOUS | Status: DC

## 2017-07-09 MED ORDER — DIPHENHYDRAMINE HCL 25 MG PO CAPS
25.0000 mg | ORAL_CAPSULE | Freq: Four times a day (QID) | ORAL | Status: DC | PRN
  Administered 2017-07-09 – 2017-07-13 (×11): 25 mg via ORAL
  Filled 2017-07-09 (×11): qty 1

## 2017-07-09 MED ORDER — LIVING WELL WITH DIABETES BOOK
Freq: Once | Status: AC
  Filled 2017-07-09: qty 1

## 2017-07-09 NOTE — Progress Notes (Addendum)
PROGRESS NOTE [facility]   [name]   TDV:[id number] DOB: 06/06/1966  DOA: 06/28/2017 PCP: [name], PA-C   Brief Narrative:  [name] is a 51 year old male with medical history of hypertension, diabetes, gout who was sent from [place] for management of intra-abdominal abscess.  CT scan was consistent with multiple liver abscesses and possible cholecystitis.  General surgery was consulted and recommended transfer to [facility] for further evaluation.  Patient was evaluated by IR who placed to percutaneous drain on the right upper quadrant 10/25. ID was consulted and recommended long term abx.   Subjective: Patient seen and examined, continues to do well, no acute events overnight. Pain has improve some.    Assessment & Plan: Multiple intrahepatic abscesses, and concerns of cholecystitis due to strep bacteremia Status post IR percutaneous drainage x2 Discussed case with IR, there is 2 other abscesses that were not able to be drained Patient will go for CT abdomen w contrast tomorrow, and possible new drain placement on Monday 11/5 Leukocytosis improving ID recommending Unasyn plus fluconazole for total of 4-week and repeat CT scan at that time. Check CBC in AM  HIDA scan was negative for cholecystitis Pain meds PRN   Group F streptococcal bacteremia  Treatment per ID.  ECHO no vegetations  Repeated blood cultures negative    Diarrhea - felt to be abx related  - resolved  C diff negative   DM Type 2 uncontrolled with hyperglycemia  CBG's continue to be stable  A1C 13.4  Continue 70/30 Continue SSI   Leukocytosis and thrombocytosis  Felt to be reactive to infectious process, will check CBC in AM  Improving will check cbc in am   Iron deficiency anemia  No overt bleeding, Iron supplement on hold due to bacteremia, will resume on upon discharge  Hgb stable    Physical deconditioning  PT recommending HH PT - improved   DVT prophylaxis: lovenox    Code Status: Full  Family Communication: None at bedside  Disposition Plan: Will remain inpatient for possible procedure on Monday   Consultants:   IR  Surgery   ID   Procedures:   CT guided perc cath x 2  PICC R cephalic   Antimicrobials: Anti-infectives    Start     Dose/Rate Route Frequency Ordered Stop   07/05/17 1200  Ampicillin-Sulbactam (UNASYN) 3 g in sodium chloride 0.9 % 100 mL IVPB     3 g 200 mL/hr over 30 Minutes Intravenous Every 6 hours 07/05/17 1043     07/05/17 1000  fluconazole (DIFLUCAN) tablet 400 mg     400 mg Oral Daily 07/04/17 1445     07/03/17 1530  fluconazole (DIFLUCAN) tablet 200 mg  Status:  Discontinued     200 mg Oral Daily 07/03/17 1506 07/04/17 1445   06/30/17 2000  cefTRIAXone (ROCEPHIN) 2 g in dextrose 5 % 50 mL IVPB  Status:  Discontinued     2 g 100 mL/hr over 30 Minutes Intravenous Every 24 hours 06/30/17 1304 07/05/17 1043   06/30/17 2000  metroNIDAZOLE (FLAGYL) tablet 500 mg  Status:  Discontinued     500 mg Oral Every 8 hours 06/30/17 1304 07/05/17 1043   06/30/17 0500  vancomycin (VANCOCIN) 1,500 mg in sodium chloride 0.9 % 500 mL IVPB  Status:  Discontinued     1,500 mg 250 mL/hr over 120 Minutes Intravenous Every 12 hours 06/29/17 1657 06/30/17 1304   06/29/17 1700  vancomycin (VANCOCIN) 2,500 mg  in sodium chloride 0.9 % 500 mL IVPB     2,500 mg 250 mL/hr over 120 Minutes Intravenous  Once 06/29/17 1654 06/29/17 1900   06/29/17 0430  piperacillin-tazobactam (ZOSYN) IVPB 3.375 g  Status:  Discontinued     3.375 g 12.5 mL/hr over 240 Minutes Intravenous Every 8 hours 06/29/17 0042 06/30/17 1304   06/29/17 0013  metroNIDAZOLE (FLAGYL) IVPB 500 mg  Status:  Discontinued     500 mg 100 mL/hr over 60 Minutes Intravenous Every 8 hours 06/29/17 0013 06/29/17 1146   06/28/17 2100  metroNIDAZOLE (FLAGYL) IVPB 500 mg     500 mg 100 mL/hr over 60 Minutes Intravenous  Once 06/28/17 2049 06/28/17 2214   06/28/17 2015   piperacillin-tazobactam (ZOSYN) IVPB 3.375 g     3.375 g 100 mL/hr over 30 Minutes Intravenous  Once 06/28/17 2008 06/28/17 2101      Objective: Vitals:   07/08/17 1900 07/08/17 2139 07/09/17 0511 07/09/17 1513  BP: 123/80 115/64 (!) 146/81 (!) 150/91  Pulse: (!) 108 (!) 104 86 92  Resp: 20 19 18 18   Temp: 99.7 F (37.6 C) 99.4 F (37.4 C) 98.3 F (36.8 C) 99.1 F (37.3 C)  TempSrc: Oral Oral Oral Oral  SpO2: 95% 95% 95% 97%  Weight:  (!) 171 kg (377 lb)    Height:  5\' 8"  (1.727 m)      Intake/Output Summary (Last 24 hours) at 07/09/17 1532 Last data filed at 07/09/17 1228  Gross per 24 hour  Intake              220 ml  Output              262 ml  Net              -42 ml   Filed Weights   06/28/17 1535 06/29/17 0403 07/08/17 2139  Weight: (!) 170.1 kg (375 lb) (!) 170.5 kg (375 lb 12.8 oz) (!) 171 kg (377 lb)    Examination:  General: standing up Cardiovascular: RRR Respiratory: Good air entry  Abdominal: Soft, NT, ND, + bowel sounds. Drain in place x 2 minimal serosanguineous discharge, incision clean, does not look infected.   Data Reviewed: I have personally reviewed following labs and imaging studies  CBC:  Recent Labs Lab 07/03/17 0235 07/04/17 0505 07/05/17 0605 07/06/17 0517 07/08/17 0541  WBC 20.9* 22.5* 24.4* 20.7* 17.7*  NEUTROABS  --   --  18.1*  --  12.4*  HGB 11.6* 11.2* 12.4* 11.5* 11.0*  HCT 36.5* 36.0* 39.0 36.1* 35.0*  MCV 82.2 82.0 81.9 82.0 82.4  PLT 557* 602* 645* 580* 539*   Basic Metabolic Panel:  Recent Labs Lab 07/03/17 0235 07/05/17 0605 07/08/17 0541  NA 137 137 136  K 3.9 4.2 4.2  CL 102 103 102  CO2 26 26 27   GLUCOSE 192* 156* 149*  BUN 5* 8 7  CREATININE 0.74 0.79 0.87  CALCIUM 8.5* 8.7* 8.5*  MG  --  2.1  --   PHOS 4.4 4.0  --    GFR: Estimated Creatinine Clearance: 153.7 mL/min (by C-G formula based on SCr of 0.87 mg/dL). Liver Function Tests:  Recent Labs Lab 07/03/17 0235 07/05/17 0605  AST  --  19    ALT  --  16*  ALKPHOS  --  294*  BILITOT  --  1.0  PROT  --  7.4  ALBUMIN 1.6* 1.8*   No results for input(s): LIPASE, AMYLASE in the last  168 hours. No results for input(s): AMMONIA in the last 168 hours. Coagulation Profile: No results for input(s): INR, PROTIME in the last 168 hours. Cardiac Enzymes: No results for input(s): CKTOTAL, CKMB, CKMBINDEX, TROPONINI in the last 168 hours. BNP (last 3 results) No results for input(s): PROBNP in the last 8760 hours. HbA1C: No results for input(s): HGBA1C in the last 72 hours. CBG:  Recent Labs Lab 07/08/17 1855 07/08/17 2042 07/08/17 2135 07/09/17 0913 07/09/17 1233  GLUCAP 161* 129* 134* 130* 165*   Lipid Profile: No results for input(s): CHOL, HDL, LDLCALC, TRIG, CHOLHDL, LDLDIRECT in the last 72 hours. Thyroid Function Tests: No results for input(s): TSH, T4TOTAL, FREET4, T3FREE, THYROIDAB in the last 72 hours. Anemia Panel: No results for input(s): VITAMINB12, FOLATE, FERRITIN, TIBC, IRON, RETICCTPCT in the last 72 hours. Sepsis Labs: No results for input(s): PROCALCITON, LATICACIDVEN in the last 168 hours.  Recent Results (from the past 240 hour(s))  Culture, blood (routine x 2)     Status: None   Collection Time: 06/29/17  6:58 PM  Result Value Ref Range Status   Specimen Description BLOOD RIGHT HAND  Final   Special Requests   Final    BOTTLES DRAWN AEROBIC ONLY Blood Culture adequate volume   Culture NO GROWTH 5 DAYS  Final   Report Status 07/04/2017 FINAL  Final  Culture, blood (routine x 2)     Status: None   Collection Time: 06/29/17  7:09 PM  Result Value Ref Range Status   Specimen Description BLOOD RIGHT ANTECUBITAL  Final   Special Requests   Final    BOTTLES DRAWN AEROBIC AND ANAEROBIC Blood Culture adequate volume   Culture NO GROWTH 5 DAYS  Final   Report Status 07/04/2017 FINAL  Final  Aerobic/Anaerobic Culture (surgical/deep wound)     Status: None   Collection Time: 06/30/17  1:31 PM  Result  Value Ref Range Status   Specimen Description ABSCESS  Final   Special Requests HEPATIC ABSCESS DRAINAGE  Final   Gram Stain   Final    ABUNDANT WBC PRESENT, PREDOMINANTLY PMN ABUNDANT GRAM POSITIVE COCCI ABUNDANT GRAM POSITIVE RODS    Culture   Final    FEW CANDIDA ALBICANS MODERATE STREPTOCOCCUS GROUP C MODERATE STREPTOCOCCUS GROUP F NO ANAEROBES ISOLATED    Report Status 07/05/2017 FINAL  Final  C difficile quick scan w PCR reflex     Status: None   Collection Time: 07/01/17  8:00 PM  Result Value Ref Range Status   C Diff antigen NEGATIVE NEGATIVE Final   C Diff toxin NEGATIVE NEGATIVE Final   C Diff interpretation No C. difficile detected.  Final  Culture, blood (routine x 2)     Status: None (Preliminary result)   Collection Time: 07/05/17  4:52 PM  Result Value Ref Range Status   Specimen Description BLOOD LEFT HAND  Final   Special Requests IN PEDIATRIC BOTTLE Blood Culture adequate volume  Final   Culture NO GROWTH 4 DAYS  Final   Report Status PENDING  Incomplete  Culture, blood (routine x 2)     Status: None (Preliminary result)   Collection Time: 07/05/17  4:55 PM  Result Value Ref Range Status   Specimen Description BLOOD LEFT ARM  Final   Special Requests IN PEDIATRIC BOTTLE Blood Culture adequate volume  Final   Culture NO GROWTH 4 DAYS  Final   Report Status PENDING  Incomplete      Radiology Studies: No results found.  Scheduled Meds: . enoxaparin (LOVENOX) injection  40 mg Subcutaneous Daily  . fluconazole  400 mg Oral Daily  . insulin aspart  0-9 Units Subcutaneous TID WC  . insulin aspart protamine- aspart  10 Units Subcutaneous BID WC  . lactobacillus acidophilus  2 tablet Oral TID  . loratadine  10 mg Oral Daily  . pantoprazole  40 mg Oral Daily  . sodium chloride flush  10-40 mL Intracatheter Q12H  . sodium chloride flush  5 mL Intravenous Q8H   Continuous Infusions: . ampicillin-sulbactam (UNASYN) IV Stopped (07/09/17 1258)     LOS:  11 days    Time spent: Total of 15 minutes spent with pt, greater than 50% of which was spent in discussion of  treatment, counseling and coordination of care    Chipper Oman, MD Pager: Text Page via [email or web address]   If 7PM-7AM, please contact night-coverage [email or web address] 07/09/2017, 3:32 PM

## 2017-07-10 ENCOUNTER — Other Ambulatory Visit: Payer: Self-pay

## 2017-07-10 ENCOUNTER — Inpatient Hospital Stay (HOSPITAL_COMMUNITY): Payer: Self-pay

## 2017-07-10 LAB — CBC WITH DIFFERENTIAL/PLATELET
Basophils Absolute: 0 10*3/uL (ref 0.0–0.1)
Basophils Relative: 0 %
EOS PCT: 1 %
Eosinophils Absolute: 0.2 10*3/uL (ref 0.0–0.7)
HEMATOCRIT: 32.6 % — AB (ref 39.0–52.0)
Hemoglobin: 10 g/dL — ABNORMAL LOW (ref 13.0–17.0)
LYMPHS ABS: 3.4 10*3/uL (ref 0.7–4.0)
Lymphocytes Relative: 19 %
MCH: 25.4 pg — ABNORMAL LOW (ref 26.0–34.0)
MCHC: 30.7 g/dL (ref 30.0–36.0)
MCV: 83 fL (ref 78.0–100.0)
MONOS PCT: 7 %
Monocytes Absolute: 1.2 10*3/uL — ABNORMAL HIGH (ref 0.1–1.0)
Neutro Abs: 12.9 10*3/uL — ABNORMAL HIGH (ref 1.7–7.7)
Neutrophils Relative %: 73 %
Platelets: 555 10*3/uL — ABNORMAL HIGH (ref 150–400)
RBC: 3.93 MIL/uL — AB (ref 4.22–5.81)
RDW: 15.7 % — AB (ref 11.5–15.5)
WBC: 17.7 10*3/uL — ABNORMAL HIGH (ref 4.0–10.5)

## 2017-07-10 LAB — BASIC METABOLIC PANEL
ANION GAP: 5 (ref 5–15)
BUN: 5 mg/dL — AB (ref 6–20)
CHLORIDE: 103 mmol/L (ref 101–111)
CO2: 26 mmol/L (ref 22–32)
Calcium: 8.1 mg/dL — ABNORMAL LOW (ref 8.9–10.3)
Creatinine, Ser: 0.9 mg/dL (ref 0.61–1.24)
GFR calc Af Amer: >60 mL/min (ref >60)
GLUCOSE: 137 mg/dL — AB (ref 65–99)
POTASSIUM: 3.9 mmol/L (ref 3.5–5.1)
Sodium: 134 mmol/L — ABNORMAL LOW (ref 135–145)

## 2017-07-10 LAB — CULTURE, BLOOD (ROUTINE X 2)
Culture: NO GROWTH
Culture: NO GROWTH
SPECIAL REQUESTS: ADEQUATE
Special Requests: ADEQUATE

## 2017-07-10 LAB — GLUCOSE, CAPILLARY
GLUCOSE-CAPILLARY: 145 mg/dL — AB (ref 65–99)
GLUCOSE-CAPILLARY: 136 mg/dL — AB (ref 65–99)
Glucose-Capillary: 160 mg/dL — ABNORMAL HIGH (ref 65–99)
Glucose-Capillary: 109 mg/dL — ABNORMAL HIGH (ref 65–99)

## 2017-07-10 MED ORDER — MUSCLE RUB 10-15 % EX CREA
TOPICAL_CREAM | CUTANEOUS | Status: DC | PRN
  Administered 2017-07-10: 1 via TOPICAL
  Filled 2017-07-10: qty 85

## 2017-07-10 MED ORDER — IOPAMIDOL (ISOVUE-300) INJECTION 61%
INTRAVENOUS | Status: AC
  Administered 2017-07-10: 100 mL via INTRAVENOUS
  Filled 2017-07-10: qty 100

## 2017-07-10 MED ORDER — METHOCARBAMOL 750 MG PO TABS
750.0000 mg | ORAL_TABLET | Freq: Once | ORAL | Status: AC
  Administered 2017-07-10: 750 mg via ORAL
  Filled 2017-07-10: qty 1

## 2017-07-10 NOTE — Progress Notes (Signed)
PROGRESS NOTE [facility]   [name]   JME:[id number] DOB: Mar 25, 1966  DOA: 06/28/2017 PCP: [name], PA-C   Brief Narrative:  [name] is a 51 year old male with medical history of hypertension, diabetes, gout who was sent from [place] for management of intra-abdominal abscess.  CT scan was consistent with multiple liver abscesses and possible cholecystitis.  General surgery was consulted and recommended transfer to [facility] for further evaluation.  Patient was evaluated by IR who placed to percutaneous drain on the right upper quadrant 10/25. ID was consulted and recommended long term abx.   Subjective:.   Abdominal pain somewhat improved, c/o cramps today, otherwise stable, no acute events overnight.   Assessment & Plan: Multiple intrahepatic abscesses, and concerns of cholecystitis due to strep bacteremia - stable  Status post IR percutaneous drainage x2 Discussed case with IR, there is 2 other abscesses that were not able to be drained Patient will go for CT abdomen w contrast today and possible new drain placement on Monday 11/5 Leukocytosis improving ID recommending Unasyn plus fluconazole for total of 4-week and repeat CT scan at that time. Check CBC in AM  HIDA scan was negative for cholecystitis Pain meds PRN   Group F streptococcal bacteremia  Treatment per ID.  ECHO no vegetations  Repeated blood cultures negative    Diarrhea - felt to be abx related  - resolved  C diff negative   DM Type 2 uncontrolled with hyperglycemia  CBG's continue to be stable  A1C 13.4  Continue 70/30 Continue SSI   Leukocytosis and thrombocytosis  Felt to be reactive to infectious process WBC stable at 17K  Check CBC in AM   Iron deficiency anemia  No overt bleeding, Iron supplement on hold due to bacteremia, will resume on upon discharge  Hgb stable    Physical deconditioning  PT recommending HH PT - improved   Muscle cramp  Rub Crea, heating pad,  OTO Robaxin 750 mg   DVT prophylaxis: lovenox  Code Status: Full  Family Communication: None at bedside  Disposition Plan: Will remain inpatient for possible procedure on Monday   Consultants:   IR  Surgery   ID   Procedures:   CT guided perc cath x 2  PICC R cephalic   Antimicrobials: Anti-infectives (From admission, onward)   Start     Dose/Rate Route Frequency Ordered Stop   07/05/17 1200  Ampicillin-Sulbactam (UNASYN) 3 g in sodium chloride 0.9 % 100 mL IVPB     3 g 200 mL/hr over 30 Minutes Intravenous Every 6 hours 07/05/17 1043     07/05/17 1000  fluconazole (DIFLUCAN) tablet 400 mg     400 mg Oral Daily 07/04/17 1445     07/03/17 1530  fluconazole (DIFLUCAN) tablet 200 mg  Status:  Discontinued     200 mg Oral Daily 07/03/17 1506 07/04/17 1445   06/30/17 2000  cefTRIAXone (ROCEPHIN) 2 g in dextrose 5 % 50 mL IVPB  Status:  Discontinued     2 g 100 mL/hr over 30 Minutes Intravenous Every 24 hours 06/30/17 1304 07/05/17 1043   06/30/17 2000  metroNIDAZOLE (FLAGYL) tablet 500 mg  Status:  Discontinued     500 mg Oral Every 8 hours 06/30/17 1304 07/05/17 1043   06/30/17 0500  vancomycin (VANCOCIN) 1,500 mg in sodium chloride 0.9 % 500 mL IVPB  Status:  Discontinued     1,500 mg 250 mL/hr over 120 Minutes Intravenous Every 12 hours 06/29/17  1657 06/30/17 1304   06/29/17 1700  vancomycin (VANCOCIN) 2,500 mg in sodium chloride 0.9 % 500 mL IVPB     2,500 mg 250 mL/hr over 120 Minutes Intravenous  Once 06/29/17 1654 06/29/17 1900   06/29/17 0430  piperacillin-tazobactam (ZOSYN) IVPB 3.375 g  Status:  Discontinued     3.375 g 12.5 mL/hr over 240 Minutes Intravenous Every 8 hours 06/29/17 0042 06/30/17 1304   06/29/17 0013  metroNIDAZOLE (FLAGYL) IVPB 500 mg  Status:  Discontinued     500 mg 100 mL/hr over 60 Minutes Intravenous Every 8 hours 06/29/17 0013 06/29/17 1146   06/28/17 2100  metroNIDAZOLE (FLAGYL) IVPB 500 mg     500 mg 100 mL/hr over 60 Minutes Intravenous   Once 06/28/17 2049 06/28/17 2214   06/28/17 2015  piperacillin-tazobactam (ZOSYN) IVPB 3.375 g     3.375 g 100 mL/hr over 30 Minutes Intravenous  Once 06/28/17 2008 06/28/17 2101      Objective: Vitals:   07/09/17 0511 07/09/17 1513 07/09/17 2100 07/10/17 0529  BP: (!) 146/81 (!) 150/91 (!) 142/90 138/74  Pulse: 86 92 (!) 110 81  Resp: 18 18 20 19   Temp: 98.3 F (36.8 C) 99.1 F (37.3 C) 99.9 F (37.7 C) 99 F (37.2 C)  TempSrc: Oral Oral Oral   SpO2: 95% 97% 93% 98%  Weight:      Height:        Intake/Output Summary (Last 24 hours) at 07/10/2017 1305 Last data filed at 07/10/2017 0551 Gross per 24 hour  Intake 1160 ml  Output 1209 ml  Net -49 ml   Filed Weights   06/28/17 1535 06/29/17 0403 07/08/17 2139  Weight: (!) 170.1 kg (375 lb) (!) 170.5 kg (375 lb 12.8 oz) (!) 171 kg (377 lb)    Examination:  General: Pt is alert, awake, not in acute distress Cardiovascular: RRR, S1/S2 +, no rubs, no gallops Respiratory: CTA bilaterally, no wheezing, no rhonchi Abdominal: Soft, NT, ND, bowel sounds +. Drain x 2 in place minimal purulent material  Extremities: no edema  Data Reviewed: I have personally reviewed following labs and imaging studies  CBC: Recent Labs  Lab 07/04/17 0505 07/05/17 0605 07/06/17 0517 07/08/17 0541 07/10/17 0356  WBC 22.5* 24.4* 20.7* 17.7* 17.7*  NEUTROABS  --  18.1*  --  12.4* 12.9*  HGB 11.2* 12.4* 11.5* 11.0* 10.0*  HCT 36.0* 39.0 36.1* 35.0* 32.6*  MCV 82.0 81.9 82.0 82.4 83.0  PLT 602* 645* 580* 535* 482*   Basic Metabolic Panel: Recent Labs  Lab 07/05/17 0605 07/08/17 0541  NA 137 136  K 4.2 4.2  CL 103 102  CO2 26 27  GLUCOSE 156* 149*  BUN 8 7  CREATININE 0.79 0.87  CALCIUM 8.7* 8.5*  MG 2.1  --   PHOS 4.0  --    GFR: Estimated Creatinine Clearance: 153.7 mL/min (by C-G formula based on SCr of 0.87 mg/dL). Liver Function Tests: Recent Labs  Lab 07/05/17 0605  AST 19  ALT 16*  ALKPHOS 294*  BILITOT 1.0  PROT  7.4  ALBUMIN 1.8*   No results for input(s): LIPASE, AMYLASE in the last 168 hours. No results for input(s): AMMONIA in the last 168 hours. Coagulation Profile: No results for input(s): INR, PROTIME in the last 168 hours. Cardiac Enzymes: No results for input(s): CKTOTAL, CKMB, CKMBINDEX, TROPONINI in the last 168 hours. BNP (last 3 results) No results for input(s): PROBNP in the last 8760 hours. HbA1C: No results for input(s):  HGBA1C in the last 72 hours. CBG: Recent Labs  Lab 07/09/17 0913 07/09/17 1233 07/09/17 1738 07/09/17 2116 07/10/17 0747  GLUCAP 130* 165* 150* 116* 145*   Lipid Profile: No results for input(s): CHOL, HDL, LDLCALC, TRIG, CHOLHDL, LDLDIRECT in the last 72 hours. Thyroid Function Tests: No results for input(s): TSH, T4TOTAL, FREET4, T3FREE, THYROIDAB in the last 72 hours. Anemia Panel: No results for input(s): VITAMINB12, FOLATE, FERRITIN, TIBC, IRON, RETICCTPCT in the last 72 hours. Sepsis Labs: No results for input(s): PROCALCITON, LATICACIDVEN in the last 168 hours.  Recent Results (from the past 240 hour(s))  Aerobic/Anaerobic Culture (surgical/deep wound)     Status: None   Collection Time: 06/30/17  1:31 PM  Result Value Ref Range Status   Specimen Description ABSCESS  Final   Special Requests HEPATIC ABSCESS DRAINAGE  Final   Gram Stain   Final    ABUNDANT WBC PRESENT, PREDOMINANTLY PMN ABUNDANT GRAM POSITIVE COCCI ABUNDANT GRAM POSITIVE RODS    Culture   Final    FEW CANDIDA ALBICANS MODERATE STREPTOCOCCUS GROUP C MODERATE STREPTOCOCCUS GROUP F NO ANAEROBES ISOLATED    Report Status 07/05/2017 FINAL  Final  C difficile quick scan w PCR reflex     Status: None   Collection Time: 07/01/17  8:00 PM  Result Value Ref Range Status   C Diff antigen NEGATIVE NEGATIVE Final   C Diff toxin NEGATIVE NEGATIVE Final   C Diff interpretation No C. difficile detected.  Final  Culture, blood (routine x 2)     Status: None (Preliminary result)    Collection Time: 07/05/17  4:52 PM  Result Value Ref Range Status   Specimen Description BLOOD LEFT HAND  Final   Special Requests IN PEDIATRIC BOTTLE Blood Culture adequate volume  Final   Culture NO GROWTH 4 DAYS  Final   Report Status PENDING  Incomplete  Culture, blood (routine x 2)     Status: None (Preliminary result)   Collection Time: 07/05/17  4:55 PM  Result Value Ref Range Status   Specimen Description BLOOD LEFT ARM  Final   Special Requests IN PEDIATRIC BOTTLE Blood Culture adequate volume  Final   Culture NO GROWTH 4 DAYS  Final   Report Status PENDING  Incomplete      Radiology Studies: No results found.    Scheduled Meds: . fluconazole  400 mg Oral Daily  . insulin aspart  0-9 Units Subcutaneous TID WC  . insulin aspart protamine- aspart  10 Units Subcutaneous BID WC  . lactobacillus acidophilus  2 tablet Oral TID  . living well with diabetes book   Does not apply Once  . loratadine  10 mg Oral Daily  . pantoprazole  40 mg Oral Daily  . sodium chloride flush  10-40 mL Intracatheter Q12H  . sodium chloride flush  5 mL Intravenous Q8H   Continuous Infusions: . ampicillin-sulbactam (UNASYN) IV Stopped (07/10/17 1220)     LOS: 12 days    Time spent: Total of 15 minutes spent with pt, greater than 50% of which was spent in discussion of  treatment, counseling and coordination of care   Chipper Oman, MD Pager: Text Page via [email or web address]   If 7PM-7AM, please contact night-coverage [email or web address] 07/10/2017, 1:05 PM

## 2017-07-11 LAB — GLUCOSE, CAPILLARY
GLUCOSE-CAPILLARY: 138 mg/dL — AB (ref 65–99)
GLUCOSE-CAPILLARY: 95 mg/dL (ref 65–99)
GLUCOSE-CAPILLARY: 114 mg/dL — AB (ref 65–99)
Glucose-Capillary: 155 mg/dL — ABNORMAL HIGH (ref 65–99)

## 2017-07-11 LAB — CBC
HEMATOCRIT: 32.3 % — AB (ref 39.0–52.0)
HEMOGLOBIN: 10 g/dL — AB (ref 13.0–17.0)
MCH: 25.6 pg — ABNORMAL LOW (ref 26.0–34.0)
MCHC: 31 g/dL (ref 30.0–36.0)
MCV: 82.6 fL (ref 78.0–100.0)
Platelets: 561 10*3/uL — ABNORMAL HIGH (ref 150–400)
RBC: 3.91 MIL/uL — ABNORMAL LOW (ref 4.22–5.81)
RDW: 15.5 % (ref 11.5–15.5)
WBC: 16.6 10*3/uL — AB (ref 4.0–10.5)

## 2017-07-11 LAB — PROTIME-INR
INR: 1.19
PROTHROMBIN TIME: 15 s (ref 11.4–15.2)

## 2017-07-11 NOTE — Consult Note (Signed)
Chief Complaint: Patient was seen in consultation today for hepatic abscess (es) drain placement Chief Complaint  Patient presents with  . Abnormal Lab   at the request of Dr Georgette Dover  Supervising Physician: Jacqulynn Cadet  Patient Status: [facility] - In-pt  History of Present Illness: [name] is a 51 y.o. male    Hepatic abscesses Previous drains placed 06/30/17 in IR   CT Abd yesterday: 3.6 x 2.7 cm subcapsular fluid collection is again noted and stable. Complex fluid collection is again noted in the dome of the right hepatic lobe now measuring 5.0 x 2.5 cm which is not significantly changed compared to prior exam.  Plan for abscess drain placement today or tomorrow dependent on IR schedule. Pt is aware and agreeable  Past Medical History:  Diagnosis Date  . Arthritis   . Diabetes mellitus without complication ([place])    diagnosed about age 38  . Gout   . Hypertension    boarderline    Past Surgical History:  Procedure Laterality Date  . HERNIA REPAIR  2008  . MASS EXCISION Left age 29   L thigh- benign    Allergies: Patient has no known allergies.  Medications: Prior to Admission medications   Medication Sig Start Date End Date Taking? Authorizing Provider  oxyCODONE-acetaminophen (PERCOCET) 10-325 MG tablet Take 1 tablet by mouth every 4 (four) hours as needed for pain. 06/15/17  Yes Sanjuana Kava, MD  albuterol (PROVENTIL HFA;VENTOLIN HFA) 108 (90 Base) MCG/ACT inhaler Inhale 2 puffs into the lungs every 6 (six) hours as needed for wheezing or shortness of breath. 06/28/17   [name], PA-C  benzonatate (TESSALON PERLES) 100 MG capsule 1-2 po q 8 hour prn cough Patient taking differently: Take 100-200 mg by mouth 3 (three) times daily as needed for cough. 1-2 po q 8 hour prn cough 06/28/17   [name], PA-C  cephALEXin (KEFLEX) 500 MG capsule Take 1 capsule (500 mg total) by mouth 4 (four) times daily. 06/28/17   [name], PA-C    metFORMIN (GLUCOPHAGE) 1000 MG tablet Take 1 tablet (1,000 mg total) by mouth 2 (two) times daily with a meal. 06/28/17   [name], PA-C     Family History  Problem Relation Age of Onset  . Cancer Mother   . Diabetes Mother   . Heart failure Father   . Hypertension Father   . Diabetes Father     Social History   Socioeconomic History  . Marital status: Single    Spouse name: None  . Number of children: None  . Years of education: None  . Highest education level: None  Social Needs  . Financial resource strain: None  . Food insecurity - worry: None  . Food insecurity - inability: None  . Transportation needs - medical: None  . Transportation needs - non-medical: None  Occupational History  . None  Tobacco Use  . Smoking status: Former Smoker    Packs/day: 0.50    Years: 5.00    Pack years: 2.50    Types: Cigarettes    Last attempt to quit: 1990    Years since quitting: 28.8  . Smokeless tobacco: Never Used  Substance and Sexual Activity  . Alcohol use: No  . Drug use: No  . Sexual activity: None  Other Topics Concern  . None  Social History Narrative  . None    Review of Systems: A 12 point ROS discussed and pertinent positives are indicated in the HPI  above.  All other systems are negative.  Review of Systems  Constitutional: Positive for activity change, appetite change and fatigue.  Gastrointestinal: Positive for abdominal pain.  Musculoskeletal: Positive for back pain.  Neurological: Positive for weakness.  Psychiatric/Behavioral: Negative for behavioral problems and confusion.    Vital Signs: BP 140/86 (BP Location: Left Wrist)   Pulse 80   Temp (!) 97.1 F (36.2 C) (Axillary)   Resp 16   Ht 5\' 8"  (1.727 m)   Wt (!) 377 lb (171 kg)   SpO2 99%   BMI 57.32 kg/m   Physical Exam  Constitutional: He is oriented to person, place, and time.  Cardiovascular: Normal rate and regular rhythm.  Pulmonary/Chest: Effort normal and breath sounds  normal.  Abdominal: Soft. There is tenderness.  Musculoskeletal: Normal range of motion.  Neurological: He is alert and oriented to person, place, and time.  Skin: Skin is warm and dry.  Psychiatric: He has a normal mood and affect. His behavior is normal. Judgment and thought content normal.  Nursing note and vitals reviewed.   Imaging: Dg Chest 2 View  Result Date: 06/28/2017 CLINICAL DATA:  Shortness of breath.  Cough . EXAM: CHEST  2 VIEW COMPARISON:  06/10/2017. FINDINGS: Mediastinum hilar structures normal. Cardiomegaly with mild pulmonary vascular congestion. No overt pulmonary edema. Mild left base subsegmental atelectasis. No prominent pleural effusion. Pleural thickening noted bilaterally unchanged consistent scarring . IMPRESSION: 1. Cardiomegaly with mild pulmonary venous congestion. 2. Mild left base subsegmental atelectasis. Electronically Signed   By: Marcello Moores  Register   On: 06/28/2017 14:29   Ct Abdomen W Contrast  Result Date: 07/11/2017 CLINICAL DATA:  Abscesses. EXAM: CT ABDOMEN WITH CONTRAST TECHNIQUE: Multidetector CT imaging of the abdomen was performed using the standard protocol following bolus administration of intravenous contrast. CONTRAST:  162mL ISOVUE-300 IOPAMIDOL (ISOVUE-300) INJECTION 61% COMPARISON:  CT scan of July 05, 2017. FINDINGS: Lower chest: No acute abnormality. Hepatobiliary: No gallstones are noted. Stable position of drainage catheter in the right hepatic lobe adjacent to 2 cm fluid collection containing a small amount of air. Stable position of another drainage catheter seen more inferiorly in right hepatic lobe with no significant fluid collection around it. Just inferior to this catheter, 3.6 x 2.7 cm subcapsular fluid collection is again noted and stable. Complex fluid collection is again noted in the dome of the right hepatic lobe now measuring 5.0 x 2.5 cm which is not significantly changed compared to prior exam. Abscess seen in gallbladder fossa  is significantly smaller compared to prior exam, now measuring 6.8 x 4.6 cm which is decreased compared to prior exam. Pancreas: Unremarkable. No pancreatic ductal dilatation or surrounding inflammatory changes. Spleen: Normal in size without focal abnormality. Adrenals/Urinary Tract: Adrenal glands and kidneys appear normal. No hydronephrosis or renal obstruction is noted. No renal calculi are noted. Stomach/Bowel: The stomach and visualized bowel are unremarkable. Vascular/Lymphatic: No significant vascular abnormality is noted. No significant adenopathy is noted. Other: No definite hernia is noted. No abnormal fluid collection is noted. Musculoskeletal: No acute or significant osseous findings. IMPRESSION: Stable position of 2 drainage catheters in right hepatic lobe. Stable size and appearance of most hepatic abscesses is noted compared to prior exam. Abscess seen in gallbladder fossa is significantly smaller compared to prior exam. Electronically Signed   By: Marijo Conception, M.D.   On: 07/11/2017 07:41   Ct Abdomen Pelvis W Contrast  Result Date: 07/05/2017 CLINICAL DATA:  51 y/o M; abdominal infections with worsening leukocytosis. Image  guided drainage with catheter 5 days ago. EXAM: CT ABDOMEN AND PELVIS WITH CONTRAST TECHNIQUE: Multidetector CT imaging of the abdomen and pelvis was performed using the standard protocol following bolus administration of intravenous contrast. CONTRAST:  100 cc Isovue-300 COMPARISON:  None. FINDINGS: Lower chest: No acute abnormality. Hepatobiliary: 2 percutaneous drainage catheters are present within abscess in the right lobe of the liver. The abscess with catheters are decreased in size from the prior CT with minimal if any residual fluid collection. Irregular abscess with multiple small satellite foci within the dome of the liver spanning approximately 4.3 cm is stable from prior CT. Multiple abscesses within the right inferior lobe of the liver along the capsule are  decreased in attenuation and increased in size indicating increased organization and fluid components measuring 1.8, 1.9, 3.4, and 4.5 cm (series 3, image 41, 43, 48). Abscess within the gallbladder fossa is increased in size measuring 8.8 x 4.9 cm in axial plane (AP by ML series 3, image 34). The gallbladder is compressed by the gallbladder fossa abscess. Pancreas: Unremarkable. No pancreatic ductal dilatation or surrounding inflammatory changes. Spleen: Normal in size without focal abnormality. Adrenals/Urinary Tract: Adrenal glands are unremarkable. Kidneys are normal, without renal calculi, focal lesion, or hydronephrosis. Bladder is unremarkable. Stomach/Bowel: Stomach is within normal limits. Appendix appears normal. No evidence of bowel wall thickening, distention, or inflammatory changes. Vascular/Lymphatic: No significant vascular findings are present. No enlarged abdominal or pelvic lymph nodes. Reproductive: Prostate is unremarkable. Other: Stable paraumbilical hernia containing fat. Musculoskeletal: No fracture is seen. IMPRESSION: 1. Abscesses associated with drainage catheters in right lobe of liver are decreased in size with minimal if any residual fluid. 2. Increased size and organization of 4 subcapsular abscesses in the right inferior lobe of the liver. 3. Increased size of abscess in the gallbladder fossa. Electronically Signed   By: Kristine Garbe M.D.   On: 07/05/2017 21:07   Ct Abdomen Pelvis W Contrast  Result Date: 06/28/2017 CLINICAL DATA:  Initial evaluation for acute abdominal infection, right upper quadrant tenderness. EXAM: CT ABDOMEN AND PELVIS WITH CONTRAST TECHNIQUE: Multidetector CT imaging of the abdomen and pelvis was performed using the standard protocol following bolus administration of intravenous contrast. CONTRAST:  141mL ISOVUE-300 IOPAMIDOL (ISOVUE-300) INJECTION 61% COMPARISON:  Prior ultrasound 08/31/2016. FINDINGS: Lower chest: Scattered bibasilar  atelectatic changes. Visualized lungs otherwise clear. Hepatobiliary: Multiple ill-defined hypodense lesions seen within the liver, predominantly involving the right hepatic lobe. These are predominantly subcapsular in location. For reference purposes, a discrete lesion positioned within the right hepatic lobe measures approximately 4.0 x 3.7 cm in size (series 2, image 40). Associated bulging of the hepatic contour at a few areas with prominent perihepatic inflammatory stranding. Findings favored to reflect intrahepatic abscess ease, although metastatic disease could also be considered. Additionally, inflammatory stranding seen about the gallbladder in the gallbladder fossa. The gallbladder itself appears to be partially contracted. There is an adjacent oblong collection/lesion that measures approximately 8.2 x 3.5 x 5.8 cm (series 2, image 42), positioned adjacent to the gallbladder. This appears to be contiguous with several of the intrahepatic lesions (series 6, image 51, 46, 43). Finding also favored to reflect abscess, although mass or adenopathy could be considered as well. Hazy inflammatory stranding extends inferiorly and laterally about the liver. No appreciable biliary dilatation. Pancreas: Pancreas within normal limits. Spleen: Spleen within normal limits. Adrenals/Urinary Tract: Adrenal glands are normal. Kidneys equal in size with symmetric enhancement. Probable punctate nonobstructive left renal nephrolithiasis noted. No hydronephrosis or focal  enhancing renal mass. No hydroureter. Partially distended bladder within normal limits. Stomach/Bowel: Stomach within normal limits. Hazy inflammatory stranding about the second- third portion of the duodenum related to the inflammatory process about the gallbladder an liver. No evidence for bowel obstruction. Appendix is normal. No other acute inflammatory changes about the bowels. Specifically, no or significant inflammatory changes about the bowels seen to  serve as a source for intrahepatic seeding. Vascular/Lymphatic: Normal intravascular enhancement seen throughout the intra-abdominal aorta and its branch vessels. No adenopathy. Reproductive: Prostate normal. Other: No free intraperitoneal air. No free fluid. Sequelae of prior ventral hernia repair noted. There is a recurrent mildly complex fat containing ventral hernia at the lower margin of the mesh. Loop of small bowel closely approximates the hernia neck. Musculoskeletal: No acute osseus abnormality. No worrisome lytic or blastic osseous lesions. IMPRESSION: 1. Multiple abnormal intrahepatic lesions as above with adjacent perihepatic inflammatory stranding. Given the adjacent inflammatory changes, findings favored to reflect sequelae of infection with intrahepatic abscesses. Exact source not definitively identified on this exam, as no significant inflammatory changes are seen about the bowels themselves. Metastatic disease could also be considered, although is perhaps less favored. Correlation with percutaneous sampling would likely be helpful for further evaluation. 2. Additional 8.2 x 3.5 x 5.8 cm collection/lesion adjacent to the gallbladder as above, favored to be contiguous with a few of the peripheral intrahepatic lesions. Finding also favored to reflect abscess. The adjacent gallbladder is contracted with hazy inflammatory stranding in the gallbladder fossa. Under clear whether these inflammatory changes are related to adjacent infection/abscess or possibly concomitant cholecystitis. No biliary dilatation identified. 3. Sequelae of prior hernia repair with recurrent fat containing ventral hernia. Electronically Signed   By: Jeannine Boga M.D.   On: 06/28/2017 20:39   Nm Hepato W/eject Fract  Result Date: 07/05/2017 CLINICAL DATA:  Abdominal pain. EXAM: NUCLEAR MEDICINE HEPATOBILIARY IMAGING WITH GALLBLADDER EF TECHNIQUE: Sequential images of the abdomen were obtained out to 60 minutes  following intravenous administration of radiopharmaceutical. After oral ingestion of Ensure, gallbladder ejection fraction was determined. At 60 min, normal ejection fraction is greater than 33%. RADIOPHARMACEUTICALS:  5.0 mCi Tc-43m  Choletec IV COMPARISON:  CT 06/30/2017. FINDINGS: Prompt uptake and biliary excretion of activity by the liver is seen. Small photopenic area in the right lobe liver most likely related to a previously identified abscess. Gallbladder activity is visualized, consistent with patency of cystic duct. Biliary activity passes into small bowel, consistent with patent common bile duct. Calculated gallbladder ejection fraction is 61%. (Normal gallbladder ejection fraction with Ensure is greater than 33%.) IMPRESSION: Normal visualization of the gallbladder. Normal gallbladder ejection fraction. Electronically Signed   By: Marcello Moores  Register   On: 07/05/2017 13:59   Ct Image Guided Drainage By Percutaneous Catheter  Result Date: 06/30/2017 INDICATION: History of hepatic abscesses. Please perform ultrasound/CT-guided hepatic abscess drainage catheter placement. EXAM: ULTRASOUND AND CT-GUIDED HEPATIC ABSCESS DRAINAGE CATHETER PLACEMENT x2 COMPARISON:  CT abdomen pelvis - 06/28/2017 MEDICATIONS: The patient is currently admitted to the hospital and receiving intravenous antibiotics. The antibiotics were administered within an appropriate time frame prior to the initiation of the procedure. ANESTHESIA/SEDATION: Moderate (conscious) sedation was employed during this procedure. A total of Versed 7 mg and Fentanyl 400 mcg, Dilaudid 2 mg IV and Demerol 50 mg IV was administered intravenously. Moderate Sedation Time: 25 minutes. The patient's level of consciousness and vital signs were monitored continuously by radiology nursing throughout the procedure under my direct supervision. CONTRAST:  None COMPLICATIONS: SIR  LEVEL B - Normal therapy, includes overnight admission for observation. The patient  developed rigors following placement of both hepatic abscess drainage catheters which was treated with the administration of 50 mg of Demerol IV. PROCEDURE: Informed written consent was obtained from the patient after a discussion of the risks, benefits and alternatives to treatment. The patient was placed supine on the CT gantry and a pre procedural CT was performed re-demonstrating the known hepatic abscesses with dominant ill-defined component within the superolateral aspect the right lobe of the liver measuring approximately 5.3 x 4.9 cm (image 40, series 2), additional ill-defined component with the subcapsular inferior aspect of the right lobe of the liver measuring approximately 5.2 x 4.4 cm (image 63, series 2) an ill-defined slightly hyperattenuating fluid collection within the porta hepatis measuring approximately 8.5 x 4.4 cm (image 66, series 2). All 3 of these fluid collections were identified sonographically. The right superolateral abdomen as well as the midline of the upper abdomen was prepped and draped in usual sterile fashion. The overlying soft tissues were anesthetized with 1% lidocaine with epinephrine. Under direct ultrasound guidance, each hypoechoic collection were sequentially accessed with an 18 gauge trocar needle. Both hepatic samples yielded the aspiration of a small amount of purulent fluid, while the porta hepatis collection yielded a small amount of bilious appearing fluid. Short Amplatz wires were placed at all locations. CT scanning demonstrated appropriate positioning of the Amplatz wires within both hepatic abscesses, however the wire was not appropriately coiled within the porta hepatis collection. Next, both hepatic abscess drainage catheters were placed after the tracks were serially dilated allowing placement of a 10 [place] all-purpose drainage catheters. As patient was complaining of discomfort lying on the table and then developed rigors, additional intervention was not  performed at this time to attempt placement of a drainage catheter into the perihepatic abscess. Limited postprocedural scanning was performed demonstrating appropriate positioning of both hepatic abscess drainage catheters. Approximately 15 cc of purulent fluid was aspirated from the superolateral abscess while approximately 35 cc was aspirated from the abscess within the anterior aspect the right lobe of the liver. Both drainage catheters were secured at the skin entrance site within interrupted suture and connected to JP bulbs. Dressings were placed. IMPRESSION: 1. Successful ultrasound and CT-guided drainage catheter placement into dominant abscess within the superolateral aspect the right lobe of the liver yielding 15 cc of purulent material. 2. Successful ultrasound and CT-guided drainage catheter placement into dominant abscess within the anterior inferior aspect the right lobe of the liver yielding 35 cc of purulent material. 3. Attempted though ultimately unsuccessful placement of a percutaneous drainage catheter into the ill-defined suspected abscess within the porta hepatis abscess, secondary to patient's inability to tolerate the procedure as well as development of rigors following placement of the 2 additional hepatic abscess drainage catheters. 4. A representative sample of aspirated purulent material was sent to the laboratory for analysis. PLAN: - As the suspected abscess within the dome of the right lobe of the liver and potential abscess within the porta hepatis remain undrained, would have a low threshold to repeat an IV only abdominal CT if patient has infectious symptoms do not defervesce within the next several days, however note, both collections may be NOT amenable to image guided drainage secondary to location and patient body habitus. Electronically Signed   By: Sandi Mariscal M.D.   On: 06/30/2017 14:55   Ct Image Guided Drainage By Percutaneous Catheter  Result Date:  06/30/2017 INDICATION: History of  hepatic abscesses. Please perform ultrasound/CT-guided hepatic abscess drainage catheter placement. EXAM: ULTRASOUND AND CT-GUIDED HEPATIC ABSCESS DRAINAGE CATHETER PLACEMENT x2 COMPARISON:  CT abdomen pelvis - 06/28/2017 MEDICATIONS: The patient is currently admitted to the hospital and receiving intravenous antibiotics. The antibiotics were administered within an appropriate time frame prior to the initiation of the procedure. ANESTHESIA/SEDATION: Moderate (conscious) sedation was employed during this procedure. A total of Versed 7 mg and Fentanyl 400 mcg, Dilaudid 2 mg IV and Demerol 50 mg IV was administered intravenously. Moderate Sedation Time: 25 minutes. The patient's level of consciousness and vital signs were monitored continuously by radiology nursing throughout the procedure under my direct supervision. CONTRAST:  None COMPLICATIONS: SIR LEVEL B - Normal therapy, includes overnight admission for observation. The patient developed rigors following placement of both hepatic abscess drainage catheters which was treated with the administration of 50 mg of Demerol IV. PROCEDURE: Informed written consent was obtained from the patient after a discussion of the risks, benefits and alternatives to treatment. The patient was placed supine on the CT gantry and a pre procedural CT was performed re-demonstrating the known hepatic abscesses with dominant ill-defined component within the superolateral aspect the right lobe of the liver measuring approximately 5.3 x 4.9 cm (image 40, series 2), additional ill-defined component with the subcapsular inferior aspect of the right lobe of the liver measuring approximately 5.2 x 4.4 cm (image 63, series 2) an ill-defined slightly hyperattenuating fluid collection within the porta hepatis measuring approximately 8.5 x 4.4 cm (image 66, series 2). All 3 of these fluid collections were identified sonographically. The right superolateral abdomen  as well as the midline of the upper abdomen was prepped and draped in usual sterile fashion. The overlying soft tissues were anesthetized with 1% lidocaine with epinephrine. Under direct ultrasound guidance, each hypoechoic collection were sequentially accessed with an 18 gauge trocar needle. Both hepatic samples yielded the aspiration of a small amount of purulent fluid, while the porta hepatis collection yielded a small amount of bilious appearing fluid. Short Amplatz wires were placed at all locations. CT scanning demonstrated appropriate positioning of the Amplatz wires within both hepatic abscesses, however the wire was not appropriately coiled within the porta hepatis collection. Next, both hepatic abscess drainage catheters were placed after the tracks were serially dilated allowing placement of a 10 [place] all-purpose drainage catheters. As patient was complaining of discomfort lying on the table and then developed rigors, additional intervention was not performed at this time to attempt placement of a drainage catheter into the perihepatic abscess. Limited postprocedural scanning was performed demonstrating appropriate positioning of both hepatic abscess drainage catheters. Approximately 15 cc of purulent fluid was aspirated from the superolateral abscess while approximately 35 cc was aspirated from the abscess within the anterior aspect the right lobe of the liver. Both drainage catheters were secured at the skin entrance site within interrupted suture and connected to JP bulbs. Dressings were placed. IMPRESSION: 1. Successful ultrasound and CT-guided drainage catheter placement into dominant abscess within the superolateral aspect the right lobe of the liver yielding 15 cc of purulent material. 2. Successful ultrasound and CT-guided drainage catheter placement into dominant abscess within the anterior inferior aspect the right lobe of the liver yielding 35 cc of purulent material. 3. Attempted though  ultimately unsuccessful placement of a percutaneous drainage catheter into the ill-defined suspected abscess within the porta hepatis abscess, secondary to patient's inability to tolerate the procedure as well as development of rigors following placement of the 2 additional hepatic  abscess drainage catheters. 4. A representative sample of aspirated purulent material was sent to the laboratory for analysis. PLAN: - As the suspected abscess within the dome of the right lobe of the liver and potential abscess within the porta hepatis remain undrained, would have a low threshold to repeat an IV only abdominal CT if patient has infectious symptoms do not defervesce within the next several days, however note, both collections may be NOT amenable to image guided drainage secondary to location and patient body habitus. Electronically Signed   By: Sandi Mariscal M.D.   On: 06/30/2017 14:55    Labs:  CBC: Recent Labs    07/06/17 0517 07/08/17 0541 07/10/17 0356 07/11/17 0325  WBC 20.7* 17.7* 17.7* 16.6*  HGB 11.5* 11.0* 10.0* 10.0*  HCT 36.1* 35.0* 32.6* 32.3*  PLT 580* 535* 555* 561*    COAGS: Recent Labs    06/30/17 0709 07/11/17 0325  INR 1.24 1.19    BMP: Recent Labs    07/03/17 0235 07/05/17 0605 07/08/17 0541 07/10/17 2005  NA 137 137 136 134*  K 3.9 4.2 4.2 3.9  CL 102 103 102 103  CO2 26 26 27 26   GLUCOSE 192* 156* 149* 137*  BUN 5* 8 7 5*  CALCIUM 8.5* 8.7* 8.5* 8.1*  CREATININE 0.74 0.79 0.87 0.90  GFRNONAA >60 >60 >60 >60  GFRAA >60 >60 >60 >60    LIVER FUNCTION TESTS: Recent Labs    06/28/17 1232 06/30/17 0709 07/02/17 0522 07/03/17 0235 07/05/17 0605  BILITOT 3.3* 1.7* 0.9  --  1.0  AST 115* 38 25  --  19  ALT 73* 40 27  --  16*  ALKPHOS 474* 406* 337*  --  294*  PROT 7.9 7.5 7.8  --  7.4  ALBUMIN 2.2* 1.7* 1.6*  1.6* 1.6* 1.8*    TUMOR MARKERS: No results for input(s): AFPTM, CEA, CA199, CHROMGRNA in the last 8760 hours.  Assessment and  Plan:  Hepatic abscesses Drains placed in IR 10/25 + leukocytosis; abd pain Initial drainage is minimal New CT does reflect remaining collections - not drainable with existing drains in place. Now scheduled for at least one additional drain- possibely 2 new drains Risks and benefits discussed with the patient including bleeding, infection, damage to adjacent structures, bowel perforation/fistula connection, and sepsis. All of the patient's questions were answered, patient is agreeable to proceed. Consent signed and in chart.  Thank you for this interesting consult.  I greatly enjoyed meeting [name] and look forward to participating in their care.  A copy of this report was sent to the requesting provider on this date.  Electronically Signed: Lavonia Drafts, PA-C 07/11/2017, 9:42 AM   I spent a total of 40 Minutes    in face to face in clinical consultation, greater than 50% of which was counseling/coordinating care for new hepatic abscess drain

## 2017-07-11 NOTE — Progress Notes (Signed)
PT Cancellation Note  Patient Details Name: [name] MRN: [id number] DOB: [birth year]   Cancelled Treatment:    Reason Eval/Treat Not Completed: Patient declined, no reason specified; just up walking in room prior to attempts at PT this morning.  Reports would like to wait until after procedure for drain due to needing to drink water when walking with very dry mouth.  Will attempt again if time permits.    Reginia Naas 07/11/2017, 1:53 PM Magda Kiel, [place] 07/11/2017

## 2017-07-11 NOTE — Progress Notes (Signed)
PROGRESS NOTE [facility]   [name]   CVE:[id number] DOB: [birth year]  DOA: 06/28/2017 PCP: [name], PA-C   Brief Narrative:  [name] is a 51 year old male with medical history of hypertension, diabetes, gout who was sent from [place] for management of intra-abdominal abscess.  CT scan was consistent with multiple liver abscesses and possible cholecystitis.  General surgery was consulted and recommended transfer to [facility] for further evaluation.  Patient was evaluated by IR who placed to percutaneous drain on the right upper quadrant 10/25. ID was consulted and recommended long term abx.   Subjective:.   No new complaints today, CT shows some improvement of hepatic abscesses. IR planning to place drains today.   Assessment & Plan: Multiple intrahepatic abscesses, and concerns of cholecystitis due to strep bacteremia - stable  Status post IR percutaneous drainage x2 CT shows some improvements of hepatic abscesses although collections remain quite big. IR planning to drain this today  Leukocytosis slowly improving ID recommending Unasyn plus fluconazole for total of 4-week and repeat CT scan at that time. Check CBC in AM  HIDA scan was negative for cholecystitis Pain meds PRN   Group F streptococcal bacteremia  Treatment per ID. Unasyn plus fluconazole for total of 4-week ECHO no vegetations  Repeated blood cultures negative    Diarrhea - felt to be abx related  - resolved  C diff negative   DM Type 2 uncontrolled with hyperglycemia  CBG's remain stable  A1C 13.4  Continue 70/30 Continue SSI   Leukocytosis and thrombocytosis  Felt to be reactive to infectious process WBC stable at 17K  Check CBC in AM   Iron deficiency anemia  No overt bleeding, Iron supplement on hold due to bacteremia, will resume on upon discharge  Hgb stable    Physical deconditioning  PT recommending HH PT - improved   Muscle cramp  Rub Crea, heating pad, OTO  Robaxin 750 mg   DVT prophylaxis: lovenox  Code Status: Full  Family Communication: None at bedside  Disposition Plan: Will remain inpatient for possible procedure on Monday   Consultants:   IR  Surgery   ID   Procedures:   CT guided perc cath x 2  PICC R cephalic   Antimicrobials: Anti-infectives (From admission, onward)   Start     Dose/Rate Route Frequency Ordered Stop   07/05/17 1200  Ampicillin-Sulbactam (UNASYN) 3 g in sodium chloride 0.9 % 100 mL IVPB     3 g 200 mL/hr over 30 Minutes Intravenous Every 6 hours 07/05/17 1043     07/05/17 1000  fluconazole (DIFLUCAN) tablet 400 mg     400 mg Oral Daily 07/04/17 1445     07/03/17 1530  fluconazole (DIFLUCAN) tablet 200 mg  Status:  Discontinued     200 mg Oral Daily 07/03/17 1506 07/04/17 1445   06/30/17 2000  cefTRIAXone (ROCEPHIN) 2 g in dextrose 5 % 50 mL IVPB  Status:  Discontinued     2 g 100 mL/hr over 30 Minutes Intravenous Every 24 hours 06/30/17 1304 07/05/17 1043   06/30/17 2000  metroNIDAZOLE (FLAGYL) tablet 500 mg  Status:  Discontinued     500 mg Oral Every 8 hours 06/30/17 1304 07/05/17 1043   06/30/17 0500  vancomycin (VANCOCIN) 1,500 mg in sodium chloride 0.9 % 500 mL IVPB  Status:  Discontinued     1,500 mg 250 mL/hr over 120 Minutes Intravenous Every 12 hours 06/29/17 1657 06/30/17 1304  06/29/17 1700  vancomycin (VANCOCIN) 2,500 mg in sodium chloride 0.9 % 500 mL IVPB     2,500 mg 250 mL/hr over 120 Minutes Intravenous  Once 06/29/17 1654 06/29/17 1900   06/29/17 0430  piperacillin-tazobactam (ZOSYN) IVPB 3.375 g  Status:  Discontinued     3.375 g 12.5 mL/hr over 240 Minutes Intravenous Every 8 hours 06/29/17 0042 06/30/17 1304   06/29/17 0013  metroNIDAZOLE (FLAGYL) IVPB 500 mg  Status:  Discontinued     500 mg 100 mL/hr over 60 Minutes Intravenous Every 8 hours 06/29/17 0013 06/29/17 1146   06/28/17 2100  metroNIDAZOLE (FLAGYL) IVPB 500 mg     500 mg 100 mL/hr over 60 Minutes Intravenous   Once 06/28/17 2049 06/28/17 2214   06/28/17 2015  piperacillin-tazobactam (ZOSYN) IVPB 3.375 g     3.375 g 100 mL/hr over 30 Minutes Intravenous  Once 06/28/17 2008 06/28/17 2101      Objective: Vitals:   07/10/17 0529 07/10/17 1521 07/10/17 2019 07/11/17 0408  BP: 138/74 125/79 125/81 140/86  Pulse: 81 92 86 80  Resp: 19 19 18 16   Temp: 99 F (37.2 C) 98.6 F (37 C) 99 F (37.2 C) (!) 97.1 F (36.2 C)  TempSrc:  Oral Oral Axillary  SpO2: 98% 100% 98% 99%  Weight:      Height:        Intake/Output Summary (Last 24 hours) at 07/11/2017 1402 Last data filed at 07/11/2017 0509 Gross per 24 hour  Intake 420 ml  Output 858 ml  Net -438 ml   Filed Weights   06/28/17 1535 06/29/17 0403 07/08/17 2139  Weight: (!) 170.1 kg (375 lb) (!) 170.5 kg (375 lb 12.8 oz) (!) 171 kg (377 lb)    Examination:  General: Pt is alert, awake, not in acute distress Cardiovascular: RRR, S1/S2 +, no rubs, no gallops Respiratory: CTA bilaterally, no wheezing, no rhonchi Abdominal: Soft, NT, ND, bowel sounds + Drain place  Extremities: no edema, no cyanosis  Data Reviewed: I have personally reviewed following labs and imaging studies  CBC: Recent Labs  Lab 07/05/17 0605 07/06/17 0517 07/08/17 0541 07/10/17 0356 07/11/17 0325  WBC 24.4* 20.7* 17.7* 17.7* 16.6*  NEUTROABS 18.1*  --  12.4* 12.9*  --   HGB 12.4* 11.5* 11.0* 10.0* 10.0*  HCT 39.0 36.1* 35.0* 32.6* 32.3*  MCV 81.9 82.0 82.4 83.0 82.6  PLT 645* 580* 535* 555* 211*   Basic Metabolic Panel: Recent Labs  Lab 07/05/17 0605 07/08/17 0541 07/10/17 2005  NA 137 136 134*  K 4.2 4.2 3.9  CL 103 102 103  CO2 26 27 26   GLUCOSE 156* 149* 137*  BUN 8 7 5*  CREATININE 0.79 0.87 0.90  CALCIUM 8.7* 8.5* 8.1*  MG 2.1  --   --   PHOS 4.0  --   --    GFR: Estimated Creatinine Clearance: 148.6 mL/min (by C-G formula based on SCr of 0.9 mg/dL). Liver Function Tests: Recent Labs  Lab 07/05/17 0605  AST 19  ALT 16*  ALKPHOS  294*  BILITOT 1.0  PROT 7.4  ALBUMIN 1.8*   No results for input(s): LIPASE, AMYLASE in the last 168 hours. No results for input(s): AMMONIA in the last 168 hours. Coagulation Profile: Recent Labs  Lab 07/11/17 0325  INR 1.19   Cardiac Enzymes: No results for input(s): CKTOTAL, CKMB, CKMBINDEX, TROPONINI in the last 168 hours. BNP (last 3 results) No results for input(s): PROBNP in the last 8760 hours. HbA1C:  No results for input(s): HGBA1C in the last 72 hours. CBG: Recent Labs  Lab 07/10/17 1258 07/10/17 1742 07/10/17 2200 07/11/17 0755 07/11/17 1233  GLUCAP 160* 109* 136* 138* 95   Lipid Profile: No results for input(s): CHOL, HDL, LDLCALC, TRIG, CHOLHDL, LDLDIRECT in the last 72 hours. Thyroid Function Tests: No results for input(s): TSH, T4TOTAL, FREET4, T3FREE, THYROIDAB in the last 72 hours. Anemia Panel: No results for input(s): VITAMINB12, FOLATE, FERRITIN, TIBC, IRON, RETICCTPCT in the last 72 hours. Sepsis Labs: No results for input(s): PROCALCITON, LATICACIDVEN in the last 168 hours.  Recent Results (from the past 240 hour(s))  C difficile quick scan w PCR reflex     Status: None   Collection Time: 07/01/17  8:00 PM  Result Value Ref Range Status   C Diff antigen NEGATIVE NEGATIVE Final   C Diff toxin NEGATIVE NEGATIVE Final   C Diff interpretation No C. difficile detected.  Final  Culture, blood (routine x 2)     Status: None   Collection Time: 07/05/17  4:52 PM  Result Value Ref Range Status   Specimen Description BLOOD LEFT HAND  Final   Special Requests IN PEDIATRIC BOTTLE Blood Culture adequate volume  Final   Culture NO GROWTH 5 DAYS  Final   Report Status 07/10/2017 FINAL  Final  Culture, blood (routine x 2)     Status: None   Collection Time: 07/05/17  4:55 PM  Result Value Ref Range Status   Specimen Description BLOOD LEFT ARM  Final   Special Requests IN PEDIATRIC BOTTLE Blood Culture adequate volume  Final   Culture NO GROWTH 5 DAYS   Final   Report Status 07/10/2017 FINAL  Final      Radiology Studies: Ct Abdomen W Contrast  Result Date: 07/11/2017 CLINICAL DATA:  Abscesses. EXAM: CT ABDOMEN WITH CONTRAST TECHNIQUE: Multidetector CT imaging of the abdomen was performed using the standard protocol following bolus administration of intravenous contrast. CONTRAST:  16mL ISOVUE-300 IOPAMIDOL (ISOVUE-300) INJECTION 61% COMPARISON:  CT scan of July 05, 2017. FINDINGS: Lower chest: No acute abnormality. Hepatobiliary: No gallstones are noted. Stable position of drainage catheter in the right hepatic lobe adjacent to 2 cm fluid collection containing a small amount of air. Stable position of another drainage catheter seen more inferiorly in right hepatic lobe with no significant fluid collection around it. Just inferior to this catheter, 3.6 x 2.7 cm subcapsular fluid collection is again noted and stable. Complex fluid collection is again noted in the dome of the right hepatic lobe now measuring 5.0 x 2.5 cm which is not significantly changed compared to prior exam. Abscess seen in gallbladder fossa is significantly smaller compared to prior exam, now measuring 6.8 x 4.6 cm which is decreased compared to prior exam. Pancreas: Unremarkable. No pancreatic ductal dilatation or surrounding inflammatory changes. Spleen: Normal in size without focal abnormality. Adrenals/Urinary Tract: Adrenal glands and kidneys appear normal. No hydronephrosis or renal obstruction is noted. No renal calculi are noted. Stomach/Bowel: The stomach and visualized bowel are unremarkable. Vascular/Lymphatic: No significant vascular abnormality is noted. No significant adenopathy is noted. Other: No definite hernia is noted. No abnormal fluid collection is noted. Musculoskeletal: No acute or significant osseous findings. IMPRESSION: Stable position of 2 drainage catheters in right hepatic lobe. Stable size and appearance of most hepatic abscesses is noted compared to  prior exam. Abscess seen in gallbladder fossa is significantly smaller compared to prior exam. Electronically Signed   By: Marijo Conception, M.D.  On: 07/11/2017 07:41      Scheduled Meds: . fluconazole  400 mg Oral Daily  . insulin aspart  0-9 Units Subcutaneous TID WC  . insulin aspart protamine- aspart  10 Units Subcutaneous BID WC  . lactobacillus acidophilus  2 tablet Oral TID  . living well with diabetes book   Does not apply Once  . loratadine  10 mg Oral Daily  . pantoprazole  40 mg Oral Daily  . sodium chloride flush  10-40 mL Intracatheter Q12H  . sodium chloride flush  5 mL Intravenous Q8H   Continuous Infusions: . ampicillin-sulbactam (UNASYN) IV 3 g (07/11/17 1221)     LOS: 13 days    Time spent: Total of 15 minutes spent with pt, greater than 50% of which was spent in discussion of  treatment, counseling and coordination of care   Chipper Oman, MD Pager: Text Page via [email or web address]   If 7PM-7AM, please contact night-coverage [email or web address] 07/11/2017, 2:02 PM

## 2017-07-11 NOTE — Progress Notes (Signed)
Occupational Therapy Treatment Patient Details Name: [name] MRN: [id number] DOB: 1966-06-15 Today's Date: 07/11/2017    History of present illness Patient is a 51 yo male admitted 06/28/17 with RUQ abdominal pain.  Patient with hepatic abscess and cholecystitis, with percutaneous drains placed 06/30/17.     PMH:  obesity, DM, HTN, gout, OA   OT comments  Pt educated in use of AE for LB bathing and dressing, issued. Pt grateful for items. Pt looking forward to procedure today so he can drink.   Follow Up Recommendations  Home health OT    Equipment Recommendations  Tub/shower bench    Recommendations for Other Services      Precautions / Restrictions Precautions Precautions: Other (comment) Precaution Comments: 2 percutaneous drains; watch HR and BP       Mobility Bed Mobility Overal bed mobility: Needs Assistance Bed Mobility: Supine to Sit;Sit to Supine     Supine to sit: Min assist;HOB elevated Sit to supine: Min assist   General bed mobility comments: Assist for R LE  Transfers                      Balance Overall balance assessment: Needs assistance   Sitting balance-Leahy Scale: Good                                     ADL either performed or assessed with clinical judgement   ADL       Grooming: Wash/dry hands;Wash/dry face;Set up;Supervision/safety;Sitting               Lower Body Dressing: Minimal assistance;Sit to/from stand(issued and educated in use of AE)                 General ADL Comments: Pt educated in use of long handled bath sponge for back and feet and issued.     Vision       Perception     Praxis      Cognition Arousal/Alertness: Awake/alert Behavior During Therapy: WFL for tasks assessed/performed Overall Cognitive Status: Within Functional Limits for tasks assessed                                          Exercises     Shoulder Instructions       General  Comments      Pertinent Vitals/ Pain       Pain Assessment: Faces Faces Pain Scale: Hurts even [name] Pain Location: abdomen, R side with transitional movements Pain Descriptors / Indicators: Grimacing;Guarding;Sore Pain Intervention(s): Monitored during session;Repositioned  Home Living                                          Prior Functioning/Environment              Frequency  Min 2X/week        Progress Toward Goals  OT Goals(current goals can now be found in the care plan section)  Progress towards OT goals: Progressing toward goals  Acute Rehab OT Goals Patient Stated Goal: To get stronger OT Goal Formulation: With patient Time For Goal Achievement: 07/17/17 Potential to Achieve Goals: Good  Plan Discharge plan remains appropriate  Co-evaluation                 AM-PAC PT "6 Clicks" Daily Activity     Outcome Measure   Help from another person eating meals?: None Help from another person taking care of personal grooming?: A Little Help from another person toileting, which includes using toliet, bedpan, or urinal?: A Little Help from another person bathing (including washing, rinsing, drying)?: A Lot Help from another person to put on and taking off regular upper body clothing?: A Little Help from another person to put on and taking off regular lower body clothing?: A Little 6 Click Score: 18    End of Session    OT Visit Diagnosis: Unsteadiness on feet (R26.81);Other abnormalities of gait and mobility (R26.89);Muscle weakness (generalized) (M62.81);Pain   Activity Tolerance Patient tolerated treatment well   Patient Left in bed;with call bell/phone within reach   Nurse Communication (wants ice chips)        Time: 1448-1856 OT Time Calculation (min): 28 min  Charges: OT General Charges $OT Visit: 1 Visit OT Treatments $Self Care/Home Management : 23-37 mins  07/11/2017 [name], OTR/L Pager:  [phone]  [name], [name] 07/11/2017, 2:59 PM

## 2017-07-11 NOTE — Progress Notes (Signed)
CC: Liver abscess  Subjective: No real change from last week.  Minimal drainage each day but what is in the JPs both look like purulent white cloudy fluid.  He is going down for a new drain placement today by IR.  Objective: Vital signs in last 24 hours: Temp:  [97.1 F (36.2 C)-99 F (37.2 C)] 97.1 F (36.2 C) (11/05 0408) Pulse Rate:  [80-92] 80 (11/05 0408) Resp:  [16-19] 16 (11/05 0408) BP: (125-140)/(79-86) 140/86 (11/05 0408) SpO2:  [98 %-100 %] 99 % (11/05 0408) Last BM Date: 07/09/17 7-14 ml daily per drain Afebrile, VSS WBC still 16K For new IR drain placement today    Intake/Output from previous day: 11/04 0701 - 11/05 0700 In: 420 [P.O.:420] Out: 858 [Urine:850; Drains:8] Intake/Output this shift: No intake/output data recorded.  General appearance: alert, cooperative and no distress GI: soft, minimal tenderness, wants to know when he can eat again.  Lab Results:  Recent Labs    07/10/17 0356 07/11/17 0325  WBC 17.7* 16.6*  HGB 10.0* 10.0*  HCT 32.6* 32.3*  PLT 555* 561*    BMET Recent Labs    07/10/17 2005  NA 134*  K 3.9  CL 103  CO2 26  GLUCOSE 137*  BUN 5*  CREATININE 0.90  CALCIUM 8.1*   PT/INR Recent Labs    07/11/17 0325  LABPROT 15.0  INR 1.19    Recent Labs  Lab 07/05/17 0605  AST 19  ALT 16*  ALKPHOS 294*  BILITOT 1.0  PROT 7.4  ALBUMIN 1.8*     Lipase     Component Value Date/Time   LIPASE 20 06/28/2017 1849     Medications: . fluconazole  400 mg Oral Daily  . insulin aspart  0-9 Units Subcutaneous TID WC  . insulin aspart protamine- aspart  10 Units Subcutaneous BID WC  . lactobacillus acidophilus  2 tablet Oral TID  . living well with diabetes book   Does not apply Once  . loratadine  10 mg Oral Daily  . pantoprazole  40 mg Oral Daily  . sodium chloride flush  10-40 mL Intracatheter Q12H  . sodium chloride flush  5 mL Intravenous Q8H   Anti-infectives (From admission, onward)   Start      Dose/Rate Route Frequency Ordered Stop   07/05/17 1200  Ampicillin-Sulbactam (UNASYN) 3 g in sodium chloride 0.9 % 100 mL IVPB     3 g 200 mL/hr over 30 Minutes Intravenous Every 6 hours 07/05/17 1043     07/05/17 1000  fluconazole (DIFLUCAN) tablet 400 mg     400 mg Oral Daily 07/04/17 1445     07/03/17 1530  fluconazole (DIFLUCAN) tablet 200 mg  Status:  Discontinued     200 mg Oral Daily 07/03/17 1506 07/04/17 1445   06/30/17 2000  cefTRIAXone (ROCEPHIN) 2 g in dextrose 5 % 50 mL IVPB  Status:  Discontinued     2 g 100 mL/hr over 30 Minutes Intravenous Every 24 hours 06/30/17 1304 07/05/17 1043   06/30/17 2000  metroNIDAZOLE (FLAGYL) tablet 500 mg  Status:  Discontinued     500 mg Oral Every 8 hours 06/30/17 1304 07/05/17 1043   06/30/17 0500  vancomycin (VANCOCIN) 1,500 mg in sodium chloride 0.9 % 500 mL IVPB  Status:  Discontinued     1,500 mg 250 mL/hr over 120 Minutes Intravenous Every 12 hours 06/29/17 1657 06/30/17 1304   06/29/17 1700  vancomycin (VANCOCIN) 2,500 mg in sodium chloride 0.9 %  500 mL IVPB     2,500 mg 250 mL/hr over 120 Minutes Intravenous  Once 06/29/17 1654 06/29/17 1900   06/29/17 0430  piperacillin-tazobactam (ZOSYN) IVPB 3.375 g  Status:  Discontinued     3.375 g 12.5 mL/hr over 240 Minutes Intravenous Every 8 hours 06/29/17 0042 06/30/17 1304   06/29/17 0013  metroNIDAZOLE (FLAGYL) IVPB 500 mg  Status:  Discontinued     500 mg 100 mL/hr over 60 Minutes Intravenous Every 8 hours 06/29/17 0013 06/29/17 1146   06/28/17 2100  metroNIDAZOLE (FLAGYL) IVPB 500 mg     500 mg 100 mL/hr over 60 Minutes Intravenous  Once 06/28/17 2049 06/28/17 2214   06/28/17 2015  piperacillin-tazobactam (ZOSYN) IVPB 3.375 g     3.375 g 100 mL/hr over 30 Minutes Intravenous  Once 06/28/17 2008 06/28/17 2101      Assessment/Plan Liver abscess - HIDA negative for cholecystitis. IR drain x 2 06/30/17 (CX: strep +, candida); Repeat CT with enlargement perihepatic fluid collections  not amenable to IR drainage. Continue non-surgical management with IV abx per ID. streptococcal bacteremia - Repeat cultures negative to date; no vegetations on echo. abx per ID Diabetes mellitus - A1c 13.4 HTN  Obestiy - BMI is 58.8   FEN: No IV fluids/Carb Mod VTE: SCDs, lovenox  ID: Zosyn (10/23-10/25), Vanc (10/24-10/25), Flagyl (10/23>>), Ceftriaxone (10/25-10/30), PO fluconazole 10/28 >>  Day 9 , Unasyn IV 10/30 =>> day 7     Plan:  No surgery anticipated.   Treatment will be ongoing through IR and antibiotics.  LOS: 13 days    [name] 07/11/2017 [phone]

## 2017-07-11 NOTE — Progress Notes (Signed)
Patient ID: [name], male   DOB: February 08, 1966, 51 y.o.   MRN: [id number]   IR schedule unable to accommodate drain placement today. See new orders for 11/6  Please inform pt

## 2017-07-11 NOTE — Progress Notes (Signed)
[facility] Coordinator is following pt with ID team to support pt with home IV ABX at DC.  [facility] will provide [facility] services fro Mr. Shostak.   Initial IV ABX teaching with pt started on Friday.  Pt lives with brother who will also support at home. Pt's sister who would be primary support pt was just released from the hospital last week and will not be able to help at home. I will continue with teaching IV ABX administration today to support independence at home. [facility] is ready for DC when ordered.  If patient discharges after hours, please call [phone].   Larry Sierras 07/11/2017, 10:02 AM

## 2017-07-12 ENCOUNTER — Inpatient Hospital Stay (HOSPITAL_COMMUNITY): Payer: Self-pay

## 2017-07-12 LAB — CBC WITH DIFFERENTIAL/PLATELET
Basophils Absolute: 0 10*3/uL (ref 0.0–0.1)
Basophils Relative: 0 %
EOS PCT: 1 %
Eosinophils Absolute: 0.2 10*3/uL (ref 0.0–0.7)
HCT: 31.4 % — ABNORMAL LOW (ref 39.0–52.0)
Hemoglobin: 9.7 g/dL — ABNORMAL LOW (ref 13.0–17.0)
LYMPHS ABS: 2.6 10*3/uL (ref 0.7–4.0)
Lymphocytes Relative: 19 %
MCH: 25.4 pg — AB (ref 26.0–34.0)
MCHC: 30.9 g/dL (ref 30.0–36.0)
MCV: 82.2 fL (ref 78.0–100.0)
MONO ABS: 1.1 10*3/uL — AB (ref 0.1–1.0)
MONOS PCT: 8 %
Neutro Abs: 9.7 10*3/uL — ABNORMAL HIGH (ref 1.7–7.7)
Neutrophils Relative %: 72 %
PLATELETS: 505 10*3/uL — AB (ref 150–400)
RBC: 3.82 MIL/uL — AB (ref 4.22–5.81)
RDW: 15.6 % — AB (ref 11.5–15.5)
WBC: 13.5 10*3/uL — AB (ref 4.0–10.5)

## 2017-07-12 LAB — GLUCOSE, CAPILLARY
Glucose-Capillary: 126 mg/dL — ABNORMAL HIGH (ref 65–99)
Glucose-Capillary: 97 mg/dL (ref 65–99)
Glucose-Capillary: 113 mg/dL — ABNORMAL HIGH (ref 65–99)
Glucose-Capillary: 174 mg/dL — ABNORMAL HIGH (ref 65–99)

## 2017-07-12 MED ORDER — LIDOCAINE HCL 1 % IJ SOLN
INTRAMUSCULAR | Status: AC
  Filled 2017-07-12: qty 20

## 2017-07-12 NOTE — Progress Notes (Signed)
Physical Therapy Treatment Patient Details Name: [name] FULL MRN: [id number] DOB: [birth year] Today's Date: 07/12/2017    History of Present Illness Patient is a 51 yo male admitted 06/28/17 with RUQ abdominal pain.  Patient with hepatic abscess and cholecystitis, with percutaneous drains placed 06/30/17. Drain placmenet scheduled 11/06 this afternoon.     PMH:  obesity, DM, HTN, gout, OA    PT Comments    Pt progressing towards goals. Session focused on increasing activity tolerance and gait training. Pt demonstrating improved independence with bed mobility, transfers, and ambulation. Pt currently benefiting from supervision with all mobility. Ambulated 250 feet today without UE support. SpO2=>90%, HR=90-125 throughout session, and pt reports no increase in pain. Pt scheduled for  new percutaneous drain placement  this afternoon 11/06 so may be limited next session with new pain. Pt will follow to continue to progress towards goals. Recommending HHPT when medically cleared for d/c.     Follow Up Recommendations  Home health PT;Supervision - Intermittent     Equipment Recommendations  None recommended by PT    Recommendations for Other Services       Precautions / Restrictions Precautions Precautions: Other (comment) Precaution Comments: 2 percutaneous drains Restrictions Weight Bearing Restrictions: No    Mobility  Bed Mobility Overal bed mobility: Needs Assistance Bed Mobility: Supine to Sit;Sit to Supine     Supine to sit: Supervision Sit to supine: Supervision      Transfers Overall transfer level: Needs assistance Equipment used: None Transfers: Sit to/from Stand Sit to Stand: Supervision         General transfer comment: Managed lines for safety  Ambulation/Gait Ambulation/Gait assistance: Supervision Ambulation Distance (Feet): 250 Feet Assistive device: None Gait Pattern/deviations: Antalgic;Step-through pattern;Decreased stride length Gait velocity:  decreased   General Gait Details: Decreased velocity with shortened symetrical stride length.   Stairs            Wheelchair Mobility    Modified Rankin (Stroke Patients Only)       Balance Overall balance assessment: Needs assistance Sitting-balance support: Feet supported;No upper extremity supported Sitting balance-Leahy Scale: Good     Standing balance support: No upper extremity supported Standing balance-Leahy Scale: Good Standing balance comment: Initially began walking with IV pole for UE support, able to walk without UE support for 200 feet.                             Cognition Arousal/Alertness: Awake/alert Behavior During Therapy: WFL for tasks assessed/performed Overall Cognitive Status: Within Functional Limits for tasks assessed                                        Exercises      General Comments General comments (skin integrity, edema, etc.): SpO2=>90% throughout session. HR-90-122 with activity.       Pertinent Vitals/Pain Pain Assessment: 0-10 Pain Score: 7  Pain Location: abdomen, R side with transitional movements Pain Descriptors / Indicators: Grimacing;Guarding;Sore Pain Intervention(s): Limited activity within patient's tolerance;Monitored during session;Repositioned    Home Living                      Prior Function            PT Goals (current goals can now be found in the care plan section) Acute Rehab PT Goals Patient Stated Goal:  to go home when feeling better PT Goal Formulation: With patient Time For Goal Achievement: 07/18/17 Potential to Achieve Goals: Good Progress towards PT goals: Progressing toward goals    Frequency    Min 3X/week      PT Plan Current plan remains appropriate    Co-evaluation              AM-PAC PT "6 Clicks" Daily Activity  Outcome Measure  Difficulty turning over in bed (including adjusting bedclothes, sheets and blankets)?: A  Little Difficulty moving from lying on back to sitting on the side of the bed? : A Little Difficulty sitting down on and standing up from a chair with arms (e.g., wheelchair, bedside commode, etc,.)?: A Little Help needed moving to and from a bed to chair (including a wheelchair)?: A Little Help needed walking in hospital room?: A Little Help needed climbing 3-5 steps with a railing? : A Little 6 Click Score: 18    End of Session   Activity Tolerance: Patient tolerated treatment well Patient left: in bed;with call bell/phone within reach;Other (comment) Nurse Communication: Mobility status PT Visit Diagnosis: Unsteadiness on feet (R26.81);Pain Pain - Right/Left: Right     Time: 2336-1224 PT Time Calculation (min) (ACUTE ONLY): 31 min  Charges:  $Gait Training: 8-22 mins                    G Codes:       Reinaldo Berber, PT, DPT Acute Rehab Services Pager: [phone]    Reinaldo Berber 07/12/2017, 9:47 AM

## 2017-07-12 NOTE — Progress Notes (Signed)
PROGRESS NOTE [facility]   [name]   JHE:[id number] DOB: [birth year]  DOA: 06/28/2017 PCP: [name], PA-C   Brief Narrative:  [name] is a 51 year old male with medical history of hypertension, diabetes, gout who was sent from [place] for management of intra-abdominal abscess.  CT scan was consistent with multiple liver abscesses and possible cholecystitis.  General surgery was consulted and recommended transfer to [facility] for further evaluation.  Patient was evaluated by IR who placed to percutaneous drain on the right upper quadrant 10/25. ID was consulted and recommended long term abx.   Subjective:.   Awaiting for IR, drain placement today. No new complaints   Assessment & Plan: Multiple intrahepatic abscesses, and concerns of cholecystitis due to strep bacteremia - stable  Status post IR percutaneous drainage x2 CT shows some improvements of hepatic abscesses although collections remain quite big. IR planning to drain this today  Leukocytosis slowly improving ID recommending Unasyn plus fluconazole for total of 4-week and repeat CT scan at that time. Check CBC in AM  HIDA scan was negative for cholecystitis Pain meds PRN   Group F streptococcal bacteremia  Treatment per ID. Unasyn plus fluconazole for total of 4-week ECHO no vegetations  Repeated blood cultures negative    Diarrhea - felt to be abx related  - resolved  C diff negative   DM Type 2 uncontrolled with hyperglycemia  CBG's remain stable  A1C 13.4  Continue 70/30 Continue SSI   Leukocytosis and thrombocytosis  Felt to be reactive to infectious process WBC stable at 17K  Check CBC in AM   Iron deficiency anemia  No overt bleeding, Iron supplement on hold due to bacteremia, will resume on upon discharge  Hgb stable    Physical deconditioning  PT recommending HH PT - improved   Muscle cramp  Rub Crea, heating pad, OTO Robaxin 750 mg   DVT prophylaxis: lovenox  Code  Status: Full  Family Communication: None at bedside  Disposition Plan: Will remain inpatient for possible procedure on Monday   Consultants:   IR  Surgery   ID   Procedures:   CT guided perc cath x 2  PICC R cephalic   Antimicrobials: Anti-infectives (From admission, onward)   Start     Dose/Rate Route Frequency Ordered Stop   07/05/17 1200  Ampicillin-Sulbactam (UNASYN) 3 g in sodium chloride 0.9 % 100 mL IVPB     3 g 200 mL/hr over 30 Minutes Intravenous Every 6 hours 07/05/17 1043     07/05/17 1000  fluconazole (DIFLUCAN) tablet 400 mg     400 mg Oral Daily 07/04/17 1445     07/03/17 1530  fluconazole (DIFLUCAN) tablet 200 mg  Status:  Discontinued     200 mg Oral Daily 07/03/17 1506 07/04/17 1445   06/30/17 2000  cefTRIAXone (ROCEPHIN) 2 g in dextrose 5 % 50 mL IVPB  Status:  Discontinued     2 g 100 mL/hr over 30 Minutes Intravenous Every 24 hours 06/30/17 1304 07/05/17 1043   06/30/17 2000  metroNIDAZOLE (FLAGYL) tablet 500 mg  Status:  Discontinued     500 mg Oral Every 8 hours 06/30/17 1304 07/05/17 1043   06/30/17 0500  vancomycin (VANCOCIN) 1,500 mg in sodium chloride 0.9 % 500 mL IVPB  Status:  Discontinued     1,500 mg 250 mL/hr over 120 Minutes Intravenous Every 12 hours 06/29/17 1657 06/30/17 1304   06/29/17 1700  vancomycin (VANCOCIN) 2,500 mg in  sodium chloride 0.9 % 500 mL IVPB     2,500 mg 250 mL/hr over 120 Minutes Intravenous  Once 06/29/17 1654 06/29/17 1900   06/29/17 0430  piperacillin-tazobactam (ZOSYN) IVPB 3.375 g  Status:  Discontinued     3.375 g 12.5 mL/hr over 240 Minutes Intravenous Every 8 hours 06/29/17 0042 06/30/17 1304   06/29/17 0013  metroNIDAZOLE (FLAGYL) IVPB 500 mg  Status:  Discontinued     500 mg 100 mL/hr over 60 Minutes Intravenous Every 8 hours 06/29/17 0013 06/29/17 1146   06/28/17 2100  metroNIDAZOLE (FLAGYL) IVPB 500 mg     500 mg 100 mL/hr over 60 Minutes Intravenous  Once 06/28/17 2049 06/28/17 2214   06/28/17 2015   piperacillin-tazobactam (ZOSYN) IVPB 3.375 g     3.375 g 100 mL/hr over 30 Minutes Intravenous  Once 06/28/17 2008 06/28/17 2101      Objective: Vitals:   07/11/17 1538 07/11/17 2052 07/12/17 0505 07/12/17 1353  BP: (!) 154/99 129/76 138/80 139/90  Pulse: 90 87 81 71  Resp: 17 18 18 18   Temp: 98.3 F (36.8 C) 98.4 F (36.9 C) 97.6 F (36.4 C) 98.9 F (37.2 C)  TempSrc: Oral Oral Oral Oral  SpO2: 97% 98% 100% 100%  Weight:      Height:        Intake/Output Summary (Last 24 hours) at 07/12/2017 1502 Last data filed at 07/12/2017 1201 Gross per 24 hour  Intake 440 ml  Output 1814 ml  Net -1374 ml   Filed Weights   06/28/17 1535 06/29/17 0403 07/08/17 2139  Weight: (!) 170.1 kg (375 lb) (!) 170.5 kg (375 lb 12.8 oz) (!) 171 kg (377 lb)    Examination:  No changes on exam    General: Pt is alert, awake, not in acute distress Cardiovascular: RRR, S1/S2 +, no rubs, no gallops Respiratory: CTA bilaterally, no wheezing, no rhonchi Abdominal: Soft, NT, ND, bowel sounds + Drain place  Extremities: no edema, no cyanosis  Data Reviewed: I have personally reviewed following labs and imaging studies  CBC: Recent Labs  Lab 07/06/17 0517 07/08/17 0541 07/10/17 0356 07/11/17 0325 07/12/17 0356  WBC 20.7* 17.7* 17.7* 16.6* 13.5*  NEUTROABS  --  12.4* 12.9*  --  9.7*  HGB 11.5* 11.0* 10.0* 10.0* 9.7*  HCT 36.1* 35.0* 32.6* 32.3* 31.4*  MCV 82.0 82.4 83.0 82.6 82.2  PLT 580* 535* 555* 561* 545*   Basic Metabolic Panel: Recent Labs  Lab 07/08/17 0541 07/10/17 2005  NA 136 134*  K 4.2 3.9  CL 102 103  CO2 27 26  GLUCOSE 149* 137*  BUN 7 5*  CREATININE 0.87 0.90  CALCIUM 8.5* 8.1*   GFR: Estimated Creatinine Clearance: 148.6 mL/min (by C-G formula based on SCr of 0.9 mg/dL). Liver Function Tests: No results for input(s): AST, ALT, ALKPHOS, BILITOT, PROT, ALBUMIN in the last 168 hours. No results for input(s): LIPASE, AMYLASE in the last 168 hours. No results for  input(s): AMMONIA in the last 168 hours. Coagulation Profile: Recent Labs  Lab 07/11/17 0325  INR 1.19   Cardiac Enzymes: No results for input(s): CKTOTAL, CKMB, CKMBINDEX, TROPONINI in the last 168 hours. BNP (last 3 results) No results for input(s): PROBNP in the last 8760 hours. HbA1C: No results for input(s): HGBA1C in the last 72 hours. CBG: Recent Labs  Lab 07/11/17 1233 07/11/17 1712 07/11/17 2124 07/12/17 0743 07/12/17 1208  GLUCAP 95 155* 114* 126* 97   Lipid Profile: No results for input(s):  CHOL, HDL, LDLCALC, TRIG, CHOLHDL, LDLDIRECT in the last 72 hours. Thyroid Function Tests: No results for input(s): TSH, T4TOTAL, FREET4, T3FREE, THYROIDAB in the last 72 hours. Anemia Panel: No results for input(s): VITAMINB12, FOLATE, FERRITIN, TIBC, IRON, RETICCTPCT in the last 72 hours. Sepsis Labs: No results for input(s): PROCALCITON, LATICACIDVEN in the last 168 hours.  Recent Results (from the past 240 hour(s))  Culture, blood (routine x 2)     Status: None   Collection Time: 07/05/17  4:52 PM  Result Value Ref Range Status   Specimen Description BLOOD LEFT HAND  Final   Special Requests IN PEDIATRIC BOTTLE Blood Culture adequate volume  Final   Culture NO GROWTH 5 DAYS  Final   Report Status 07/10/2017 FINAL  Final  Culture, blood (routine x 2)     Status: None   Collection Time: 07/05/17  4:55 PM  Result Value Ref Range Status   Specimen Description BLOOD LEFT ARM  Final   Special Requests IN PEDIATRIC BOTTLE Blood Culture adequate volume  Final   Culture NO GROWTH 5 DAYS  Final   Report Status 07/10/2017 FINAL  Final      Radiology Studies: Ct Abdomen W Contrast  Result Date: 07/11/2017 CLINICAL DATA:  Abscesses. EXAM: CT ABDOMEN WITH CONTRAST TECHNIQUE: Multidetector CT imaging of the abdomen was performed using the standard protocol following bolus administration of intravenous contrast. CONTRAST:  128mL ISOVUE-300 IOPAMIDOL (ISOVUE-300) INJECTION 61%  COMPARISON:  CT scan of July 05, 2017. FINDINGS: Lower chest: No acute abnormality. Hepatobiliary: No gallstones are noted. Stable position of drainage catheter in the right hepatic lobe adjacent to 2 cm fluid collection containing a small amount of air. Stable position of another drainage catheter seen more inferiorly in right hepatic lobe with no significant fluid collection around it. Just inferior to this catheter, 3.6 x 2.7 cm subcapsular fluid collection is again noted and stable. Complex fluid collection is again noted in the dome of the right hepatic lobe now measuring 5.0 x 2.5 cm which is not significantly changed compared to prior exam. Abscess seen in gallbladder fossa is significantly smaller compared to prior exam, now measuring 6.8 x 4.6 cm which is decreased compared to prior exam. Pancreas: Unremarkable. No pancreatic ductal dilatation or surrounding inflammatory changes. Spleen: Normal in size without focal abnormality. Adrenals/Urinary Tract: Adrenal glands and kidneys appear normal. No hydronephrosis or renal obstruction is noted. No renal calculi are noted. Stomach/Bowel: The stomach and visualized bowel are unremarkable. Vascular/Lymphatic: No significant vascular abnormality is noted. No significant adenopathy is noted. Other: No definite hernia is noted. No abnormal fluid collection is noted. Musculoskeletal: No acute or significant osseous findings. IMPRESSION: Stable position of 2 drainage catheters in right hepatic lobe. Stable size and appearance of most hepatic abscesses is noted compared to prior exam. Abscess seen in gallbladder fossa is significantly smaller compared to prior exam. Electronically Signed   By: Marijo Conception, M.D.   On: 07/11/2017 07:41      Scheduled Meds: . lidocaine      . fluconazole  400 mg Oral Daily  . insulin aspart  0-9 Units Subcutaneous TID WC  . insulin aspart protamine- aspart  10 Units Subcutaneous BID WC  . lactobacillus acidophilus  2  tablet Oral TID  . lidocaine      . loratadine  10 mg Oral Daily  . pantoprazole  40 mg Oral Daily  . sodium chloride flush  10-40 mL Intracatheter Q12H  . sodium chloride flush  5 mL Intravenous Q8H   Continuous Infusions: . ampicillin-sulbactam (UNASYN) IV Stopped (07/12/17 1401)     LOS: 14 days    Time spent: Total of 15 minutes spent with pt, greater than 50% of which was spent in discussion of  treatment, counseling and coordination of care   Chipper Oman, MD Pager: Text Page via [email or web address]   If 7PM-7AM, please contact night-coverage [email or web address] 07/12/2017, 3:02 PM

## 2017-07-13 ENCOUNTER — Ambulatory Visit: Payer: Self-pay | Admitting: Physician Assistant

## 2017-07-13 LAB — GLUCOSE, CAPILLARY
GLUCOSE-CAPILLARY: 157 mg/dL — AB (ref 65–99)
GLUCOSE-CAPILLARY: 143 mg/dL — AB (ref 65–99)

## 2017-07-13 MED ORDER — FERROUS SULFATE 325 (65 FE) MG PO TBEC: 325.0000 mg | DELAYED_RELEASE_TABLET | Freq: Two times a day (BID) | ORAL | 0 refills | Status: DC

## 2017-07-13 MED ORDER — FLUCONAZOLE 200 MG PO TABS: 400.0000 mg | ORAL_TABLET | Freq: Every day | ORAL | 0 refills | Status: DC

## 2017-07-13 MED ORDER — INSULIN ASPART PROT & ASPART (70-30 MIX) 100 UNIT/ML ~~LOC~~ SUSP: 10.0000 [IU] | Freq: Two times a day (BID) | SUBCUTANEOUS | 0 refills | Status: DC

## 2017-07-13 MED ORDER — "INSULIN SYRINGE-NEEDLE U-100 29G X 1/2"" 0.5 ML MISC": 0 refills | Status: DC

## 2017-07-13 MED ORDER — HEPARIN SOD (PORK) LOCK FLUSH 100 UNIT/ML IV SOLN
250.0000 [IU] | INTRAVENOUS | Status: AC | PRN
  Administered 2017-07-13: 16:00:00

## 2017-07-13 MED ORDER — METFORMIN HCL 1000 MG PO TABS: 1000.0000 mg | ORAL_TABLET | Freq: Two times a day (BID) | ORAL | 0 refills | Status: DC

## 2017-07-13 MED ORDER — AMPICILLIN-SULBACTAM SODIUM 3 (2-1) G IV SOLR: 3.0000 g | Freq: Four times a day (QID) | INTRAVENOUS | 0 refills | Status: AC

## 2017-07-13 NOTE — Progress Notes (Signed)
Occupational Therapy Treatment Patient Details Name: [name] MRN: [id number] DOB: [birth year] Today's Date: 07/13/2017    History of present illness Patient is a 51 yo male admitted 06/28/17 with RUQ abdominal pain.  Patient with hepatic abscess and cholecystitis, with percutaneous drains placed 06/30/17. Drain placmenet scheduled 11/06 this afternoon.     PMH:  obesity, DM, HTN, gout, OA   OT comments  Pt able to perform functional mobility and simulated tub transfer with supervision for safety. Recommend use of shower chair for bathing for energy conservation and fall prevention; pt agreeable. D/c plan remains appropriate. Will continue to follow acutely.   Follow Up Recommendations  Home health OT    Equipment Recommendations  Tub/shower seat    Recommendations for Other Services      Precautions / Restrictions Precautions Precautions: None Restrictions Weight Bearing Restrictions: No       Mobility Bed Mobility               General bed mobility comments: Pt OOB in chair upon arrival  Transfers Overall transfer level: Needs assistance Equipment used: None Transfers: Sit to/from Stand Sit to Stand: Supervision         General transfer comment: for safety    Balance Overall balance assessment: Needs assistance Sitting-balance support: Feet supported;No upper extremity supported Sitting balance-Leahy Scale: Good     Standing balance support: No upper extremity supported;During functional activity Standing balance-Leahy Scale: Good                             ADL either performed or assessed with clinical judgement   ADL Overall ADL's : Needs assistance/impaired                         Toilet Transfer: Supervision/safety;Ambulation Toilet Transfer Details (indicate cue type and reason): Simulated by sit to stand from chair with functional mobility in room     Tub/ Shower Transfer: Tub transfer;Ambulation;Shower  Scientist, research (medical) Details (indicate cue type and reason): Simulated tub transfer in room. Discussed use of shower chair for safety with bathing. Functional mobility during ADLs: Supervision/safety General ADL Comments: Pt familiar with AE; no quesitons or concerns on use     Vision       Perception     Praxis      Cognition Arousal/Alertness: Awake/alert Behavior During Therapy: WFL for tasks assessed/performed Overall Cognitive Status: Within Functional Limits for tasks assessed                                          Exercises     Shoulder Instructions       General Comments      Pertinent Vitals/ Pain       Pain Assessment: Faces Faces Pain Scale: Hurts little more Pain Location: abdomen Pain Descriptors / Indicators: Sore Pain Intervention(s): Monitored during session  Home Living                                          Prior Functioning/Environment              Frequency  Min 2X/week        Progress Toward Goals  OT Goals(current goals  can now be found in the care plan section)  Progress towards OT goals: Progressing toward goals  Acute Rehab OT Goals Patient Stated Goal: home today OT Goal Formulation: With patient  Plan Discharge plan remains appropriate    Co-evaluation                 AM-PAC PT "6 Clicks" Daily Activity     Outcome Measure   Help from another person eating meals?: None Help from another person taking care of personal grooming?: A Little Help from another person toileting, which includes using toliet, bedpan, or urinal?: A Little Help from another person bathing (including washing, rinsing, drying)?: A Little Help from another person to put on and taking off regular upper body clothing?: A Little Help from another person to put on and taking off regular lower body clothing?: A Little 6 Click Score: 19    End of Session    OT Visit Diagnosis: Unsteadiness on feet  (R26.81);Other abnormalities of gait and mobility (R26.89);Muscle weakness (generalized) (M62.81);Pain   Activity Tolerance Patient tolerated treatment well   Patient Left in chair;with call bell/phone within reach   Nurse Communication          Time: 3557-3220 OT Time Calculation (min): 11 min  Charges: OT General Charges $OT Visit: 1 Visit OT Treatments $Self Care/Home Management : 8-22 mins  Britley Gashi A. Ulice Brilliant, M.S., OTR/L Pager: [place] 07/13/2017, 2:27 PM

## 2017-07-13 NOTE — Progress Notes (Signed)
Patient ID: [name], male   DOB: 09-Apr-1966, 51 y.o.   MRN: [id number]  Pt arrived in IR for consideration of hepatic aspiration/drainage procedure. Pt seen, chart and imaging including  most recent and prior CTs reviewed.   Based on my personal review of the 2 most recent CT scans, all hepatic collections are getting smaller on current treatment regimen. No new or enlarging collections. WBC consistently trending down. It seems treatment is working.   Discussed findings with pt. I would not recommend additional invasive procedures for now, since current drain and treatment regimen are working. Should clinical condition deteriorate, or collections fail to continue to respond positively, we could revisit the concept of additional drainage/aspiration. The patient understood, had all questions answered, and was in agreement with this plan.  Dillard Cannon MD Main # [phone] Pager  [phone]

## 2017-07-13 NOTE — Discharge Summary (Addendum)
Physician Discharge Summary  [name]  EPP:[id number]  DOB: [birth year]  DOA: 06/28/2017 PCP: [name], PA-C  Admit date: 06/28/2017 Discharge date: 07/13/2017  Admitted From: Home  Disposition:  Home   Recommendations for Outpatient Follow-up:  1. Follow up with PCP in 1-2 weeks 2. Follow up with ID in 2 weeks  3. Please obtain BMP/CBC in one week to monitor renal function and Hgb  4. Close glucose monitoring   Home Health: PT/OT RN   Discharge Condition: Stable   CODE STATUS: Full Code   Diet recommendation: Heart Healthy / Carb Modified   Brief/Interim Summary: [name] is a 51 year old male with medical history of hypertension, diabetes, gout who was sent from [place] for management of intra-abdominal abscess.  CT scan was consistent with multiple liver abscesses and possible cholecystitis.  General surgery was consulted and recommended transfer to [facility] for further evaluation.  Patient was evaluated by IR who placed to percutaneous drain x 2 on the right upper quadrant 10/25. ID was consulted and recommended long term abx.   Subjective: Patient seen and examined, continues to do well, he remains afebrile. No acute events overnight. Per IR hepatic abscess resolving. Tolerating diet well and participating with PT with no issues.    Discharge Diagnoses/Hospital Course:  Multiple intrahepatic abscesses, and concerns of cholecystitis due to strep bacteremia - stable  Status post IR percutaneous drainage x2 CT shows some improvements of hepatic abscesses although collections still present. IR planning evaluated again felt that abscesses are resolving and decided to continue conservative management, also previous drains are been removed  Leukocytosis slowly improved WBC upon d/c 13.5 ID recommending Unasyn plus fluconazole for total of 4-week and repeat CT scan at that time.  HIDA scan was negative for cholecystitis Follow up with ID and PCP in 2 weeks    Group F streptococcal bacteremia  Treatment per ID. Unasyn plus fluconazole for total of 4-week ECHO no vegetations  Repeated blood cultures negative    Diarrhea - felt to be abx related  - resolved  C diff negative   DM Type 2 uncontrolled with hyperglycemia  CBG's remain stable  A1C 13.4  Continue 70/30 10 units BID  Resume Metformin  Close follow up needed to achieve better glucose control  A1C goal < 7  Morbid obesity  BMI 57.34 Follow up with PCP   Leukocytosis and thrombocytosis - Improved  Felt to be reactive to infectious process Remains afebrile   Iron deficiency anemia  No overt bleeding, Iron supplement was held due to bacteremia. Bacteremia has resolved, will send iron supplement. Hgb stable, check cbc in 1 week   Physical deconditioning  PT recommending HH PT - improved  [facility] care has been set up for services.   Muscle cramp - resolved  Rub Crea, heating pad, OTO Robaxin 750 mg   All other chronic medical condition were stable during the hospitalization.  Patient was seen by physical therapy, recommending home health PT  On the day of the discharge the patient's vitals were stable, and no other acute medical condition were reported by patient. the patient was felt safe to be discharge to home   Discharge Instructions  You were cared for by a hospitalist during your hospital stay. If you have any questions about your discharge medications or the care you received while you were in the hospital after you are discharged, you can call the unit and asked to speak with the hospitalist on call  if the hospitalist that took care of you is not available. Once you are discharged, your primary care physician will handle any further medical issues. Please note that NO REFILLS for any discharge medications will be authorized once you are discharged, as it is imperative that you return to your primary care physician (or establish a relationship with a primary care  physician if you do not have one) for your aftercare needs so that they can reassess your need for medications and monitor your lab values.  Discharge Instructions    Call MD for:  difficulty breathing, headache or visual disturbances   Complete by:  As directed    Call MD for:  extreme fatigue   Complete by:  As directed    Call MD for:  hives   Complete by:  As directed    Call MD for:  persistant dizziness or light-headedness   Complete by:  As directed    Call MD for:  persistant nausea and vomiting   Complete by:  As directed    Call MD for:  redness, tenderness, or signs of infection (pain, swelling, redness, odor or green/yellow discharge around incision site)   Complete by:  As directed    Call MD for:  severe uncontrolled pain   Complete by:  As directed    Call MD for:  temperature >100.4   Complete by:  As directed    Diet - low sodium heart healthy   Complete by:  As directed    Home infusion instructions [place] May follow [place] Dosing Protocol; May administer Cathflo as needed to maintain patency of vascular access device.; Flushing of vascular access device: per [facility] Protocol: 0.9% NaCl pre/post medica...   Complete by:  As directed    Instructions:  May follow [place] Dosing Protocol   Instructions:  May administer Cathflo as needed to maintain patency of vascular access device.   Instructions:  Flushing of vascular access device: per [facility] Protocol: 0.9% NaCl pre/post medication administration and prn patency; Heparin 100 u/ml, 52ml for implanted ports and Heparin 10u/ml, 103ml for all other central venous catheters.   Instructions:  May follow [facility] Anaphylaxis Protocol for First Dose Administration in the home: 0.9% NaCl at 25-50 ml/hr to maintain IV access for protocol meds. Epinephrine 0.3 ml IV/IM PRN and Benadryl 25-50 IV/IM PRN s/s of anaphylaxis.   Instructions:  [place] Infusion Coordinator (RN) to assist per patient IV care needs in the  home PRN.   Increase activity slowly   Complete by:  As directed    Outpatient Parenteral Antibiotic Therapy Consult   Complete by:  As directed      Allergies as of 07/13/2017   No Known Allergies     Medication List    STOP taking these medications   cephALEXin 500 MG capsule Commonly known as:  KEFLEX     TAKE these medications   albuterol 108 (90 Base) MCG/ACT inhaler Commonly known as:  PROVENTIL HFA;VENTOLIN HFA Inhale 2 puffs into the lungs every 6 (six) hours as needed for wheezing or shortness of breath.   ampicillin-sulbactam 3 (2-1) g injection Commonly known as:  UNASYN Inject 3 g every 6 (six) hours for 13 days into the vein.   benzonatate 100 MG capsule Commonly known as:  TESSALON PERLES 1-2 po q 8 hour prn cough What changed:    how much to take  how to take this  when to take this  reasons to  take this  additional instructions   ferrous sulfate 325 (65 FE) MG EC tablet Take 1 tablet (325 mg total) 2 (two) times daily by mouth.   fluconazole 200 MG tablet Commonly known as:  DIFLUCAN Take 2 tablets (400 mg total) daily by mouth. Start taking on:  07/14/2017   insulin aspart protamine- aspart (70-30) 100 UNIT/ML injection Commonly known as:  NOVOLOG MIX 70/30 Inject 0.1 mLs (10 Units total) 2 (two) times daily with a meal into the skin.   Insulin Syringe-Needle U-100 29G X 1/2" 0.5 ML Misc Use as indicated   metFORMIN 1000 MG tablet Commonly known as:  GLUCOPHAGE Take 1 tablet (1,000 mg total) 2 (two) times daily with a meal by mouth.   oxyCODONE-acetaminophen 10-325 MG tablet Commonly known as:  PERCOCET Take 1 tablet by mouth every 4 (four) hours as needed for pain.            Home Infusion Instuctions  (From admission, onward)        Start     Ordered   07/13/17 0000  Home infusion instructions [facility] May follow [place] Dosing Protocol; May administer Cathflo as needed to maintain patency of vascular access  device.; Flushing of vascular access device: per [facility] Protocol: 0.9% NaCl pre/post medica...    Question Answer Comment  Instructions May follow [place] Dosing Protocol   Instructions May administer Cathflo as needed to maintain patency of vascular access device.   Instructions Flushing of vascular access device: per [facility] Protocol: 0.9% NaCl pre/post medication administration and prn patency; Heparin 100 u/ml, 39ml for implanted ports and Heparin 10u/ml, 44ml for all other central venous catheters.   Instructions May follow [facility] Anaphylaxis Protocol for First Dose Administration in the home: 0.9% NaCl at 25-50 ml/hr to maintain IV access for protocol meds. Epinephrine 0.3 ml IV/IM PRN and Benadryl 25-50 IV/IM PRN s/s of anaphylaxis.   Instructions [facility] Infusion Coordinator (RN) to assist per patient IV care needs in the home PRN.      07/13/17 1411     Follow-up Information    Coralie Keens, MD. Schedule an appointment as soon as possible for a visit in 2 week(s).   Specialty:  General Surgery Why:  Schedule follow up appointment with Dr. Coralie Keens in 1-2 weeks evaluate for removal of right hepatic lobe drainage catheters. Possible surgical intervention or new hepatic abscess.  Contact information: [place] [address] [phone]        [name], MD. Schedule an appointment as soon as possible for a visit in 2 week(s).   Specialty:  Infectious Diseases Why:  Schedule follow-up appointment with Dr. [name] ID in 1-2 weeks evaluate for bacteremia, hepatic abscess. Appointment: July 14, 2017 @ 2pm Contact information: [place] [address] [phone]        [name], PA-C. Schedule an appointment as soon as possible for a visit in 1 week(s).   Specialty:  Physician Assistant Why:  Call for new appointment  Contact information: [address] [place]  [address] [phone]          No Known Allergies  Consultations:  IR   ID   Gen surgery    Procedures/Studies: Dg Chest 2 View  Result Date: 06/28/2017 CLINICAL DATA:  Shortness of breath.  Cough . EXAM: CHEST  2 VIEW COMPARISON:  06/10/2017. FINDINGS: Mediastinum hilar structures normal. Cardiomegaly with mild pulmonary  vascular congestion. No overt pulmonary edema. Mild left base subsegmental atelectasis. No prominent pleural effusion. Pleural thickening noted bilaterally unchanged consistent scarring . IMPRESSION: 1. Cardiomegaly with mild pulmonary venous congestion. 2. Mild left base subsegmental atelectasis. Electronically Signed   By: Marcello Moores  Register   On: 06/28/2017 14:29   Ct Abdomen W Contrast  Result Date: 07/11/2017 CLINICAL DATA:  Abscesses. EXAM: CT ABDOMEN WITH CONTRAST TECHNIQUE: Multidetector CT imaging of the abdomen was performed using the standard protocol following bolus administration of intravenous contrast. CONTRAST:  16mL ISOVUE-300 IOPAMIDOL (ISOVUE-300) INJECTION 61% COMPARISON:  CT scan of July 05, 2017. FINDINGS: Lower chest: No acute abnormality. Hepatobiliary: No gallstones are noted. Stable position of drainage catheter in the right hepatic lobe adjacent to 2 cm fluid collection containing a small amount of air. Stable position of another drainage catheter seen more inferiorly in right hepatic lobe with no significant fluid collection around it. Just inferior to this catheter, 3.6 x 2.7 cm subcapsular fluid collection is again noted and stable. Complex fluid collection is again noted in the dome of the right hepatic lobe now measuring 5.0 x 2.5 cm which is not significantly changed compared to prior exam. Abscess seen in gallbladder fossa is significantly smaller compared to prior exam, now measuring 6.8 x 4.6 cm which is decreased compared to prior exam. Pancreas: Unremarkable. No pancreatic ductal dilatation or surrounding inflammatory changes. Spleen:  Normal in size without focal abnormality. Adrenals/Urinary Tract: Adrenal glands and kidneys appear normal. No hydronephrosis or renal obstruction is noted. No renal calculi are noted. Stomach/Bowel: The stomach and visualized bowel are unremarkable. Vascular/Lymphatic: No significant vascular abnormality is noted. No significant adenopathy is noted. Other: No definite hernia is noted. No abnormal fluid collection is noted. Musculoskeletal: No acute or significant osseous findings. IMPRESSION: Stable position of 2 drainage catheters in right hepatic lobe. Stable size and appearance of most hepatic abscesses is noted compared to prior exam. Abscess seen in gallbladder fossa is significantly smaller compared to prior exam. Electronically Signed   By: Marijo Conception, M.D.   On: 07/11/2017 07:41   Ct Abdomen Pelvis W Contrast  Result Date: 07/05/2017 CLINICAL DATA:  51 y/o M; abdominal infections with worsening leukocytosis. Image guided drainage with catheter 5 days ago. EXAM: CT ABDOMEN AND PELVIS WITH CONTRAST TECHNIQUE: Multidetector CT imaging of the abdomen and pelvis was performed using the standard protocol following bolus administration of intravenous contrast. CONTRAST:  100 cc Isovue-300 COMPARISON:  None. FINDINGS: Lower chest: No acute abnormality. Hepatobiliary: 2 percutaneous drainage catheters are present within abscess in the right lobe of the liver. The abscess with catheters are decreased in size from the prior CT with minimal if any residual fluid collection. Irregular abscess with multiple small satellite foci within the dome of the liver spanning approximately 4.3 cm is stable from prior CT. Multiple abscesses within the right inferior lobe of the liver along the capsule are decreased in attenuation and increased in size indicating increased organization and fluid components measuring 1.8, 1.9, 3.4, and 4.5 cm (series 3, image 41, 43, 48). Abscess within the gallbladder fossa is increased in  size measuring 8.8 x 4.9 cm in axial plane (AP by ML series 3, image 34). The gallbladder is compressed by the gallbladder fossa abscess. Pancreas: Unremarkable. No pancreatic ductal dilatation or surrounding inflammatory changes. Spleen: Normal in size without focal abnormality. Adrenals/Urinary Tract: Adrenal glands are unremarkable. Kidneys are normal, without renal calculi, focal lesion, or hydronephrosis. Bladder is unremarkable. Stomach/Bowel: Stomach is within  normal limits. Appendix appears normal. No evidence of bowel wall thickening, distention, or inflammatory changes. Vascular/Lymphatic: No significant vascular findings are present. No enlarged abdominal or pelvic lymph nodes. Reproductive: Prostate is unremarkable. Other: Stable paraumbilical hernia containing fat. Musculoskeletal: No fracture is seen. IMPRESSION: 1. Abscesses associated with drainage catheters in right lobe of liver are decreased in size with minimal if any residual fluid. 2. Increased size and organization of 4 subcapsular abscesses in the right inferior lobe of the liver. 3. Increased size of abscess in the gallbladder fossa. Electronically Signed   By: Kristine Garbe M.D.   On: 07/05/2017 21:07   Ct Abdomen Pelvis W Contrast  Result Date: 06/28/2017 CLINICAL DATA:  Initial evaluation for acute abdominal infection, right upper quadrant tenderness. EXAM: CT ABDOMEN AND PELVIS WITH CONTRAST TECHNIQUE: Multidetector CT imaging of the abdomen and pelvis was performed using the standard protocol following bolus administration of intravenous contrast. CONTRAST:  125mL ISOVUE-300 IOPAMIDOL (ISOVUE-300) INJECTION 61% COMPARISON:  Prior ultrasound 08/31/2016. FINDINGS: Lower chest: Scattered bibasilar atelectatic changes. Visualized lungs otherwise clear. Hepatobiliary: Multiple ill-defined hypodense lesions seen within the liver, predominantly involving the right hepatic lobe. These are predominantly subcapsular in location.  For reference purposes, a discrete lesion positioned within the right hepatic lobe measures approximately 4.0 x 3.7 cm in size (series 2, image 40). Associated bulging of the hepatic contour at a few areas with prominent perihepatic inflammatory stranding. Findings favored to reflect intrahepatic abscess ease, although metastatic disease could also be considered. Additionally, inflammatory stranding seen about the gallbladder in the gallbladder fossa. The gallbladder itself appears to be partially contracted. There is an adjacent oblong collection/lesion that measures approximately 8.2 x 3.5 x 5.8 cm (series 2, image 42), positioned adjacent to the gallbladder. This appears to be contiguous with several of the intrahepatic lesions (series 6, image 51, 46, 43). Finding also favored to reflect abscess, although mass or adenopathy could be considered as well. Hazy inflammatory stranding extends inferiorly and laterally about the liver. No appreciable biliary dilatation. Pancreas: Pancreas within normal limits. Spleen: Spleen within normal limits. Adrenals/Urinary Tract: Adrenal glands are normal. Kidneys equal in size with symmetric enhancement. Probable punctate nonobstructive left renal nephrolithiasis noted. No hydronephrosis or focal enhancing renal mass. No hydroureter. Partially distended bladder within normal limits. Stomach/Bowel: Stomach within normal limits. Hazy inflammatory stranding about the second- third portion of the duodenum related to the inflammatory process about the gallbladder an liver. No evidence for bowel obstruction. Appendix is normal. No other acute inflammatory changes about the bowels. Specifically, no or significant inflammatory changes about the bowels seen to serve as a source for intrahepatic seeding. Vascular/Lymphatic: Normal intravascular enhancement seen throughout the intra-abdominal aorta and its branch vessels. No adenopathy. Reproductive: Prostate normal. Other: No free  intraperitoneal air. No free fluid. Sequelae of prior ventral hernia repair noted. There is a recurrent mildly complex fat containing ventral hernia at the lower margin of the mesh. Loop of small bowel closely approximates the hernia neck. Musculoskeletal: No acute osseus abnormality. No worrisome lytic or blastic osseous lesions. IMPRESSION: 1. Multiple abnormal intrahepatic lesions as above with adjacent perihepatic inflammatory stranding. Given the adjacent inflammatory changes, findings favored to reflect sequelae of infection with intrahepatic abscesses. Exact source not definitively identified on this exam, as no significant inflammatory changes are seen about the bowels themselves. Metastatic disease could also be considered, although is perhaps less favored. Correlation with percutaneous sampling would likely be helpful for further evaluation. 2. Additional 8.2 x 3.5 x 5.8 cm  collection/lesion adjacent to the gallbladder as above, favored to be contiguous with a few of the peripheral intrahepatic lesions. Finding also favored to reflect abscess. The adjacent gallbladder is contracted with hazy inflammatory stranding in the gallbladder fossa. Under clear whether these inflammatory changes are related to adjacent infection/abscess or possibly concomitant cholecystitis. No biliary dilatation identified. 3. Sequelae of prior hernia repair with recurrent fat containing ventral hernia. Electronically Signed   By: Jeannine Boga M.D.   On: 06/28/2017 20:39   Nm Hepato W/eject Fract  Result Date: 07/05/2017 CLINICAL DATA:  Abdominal pain. EXAM: NUCLEAR MEDICINE HEPATOBILIARY IMAGING WITH GALLBLADDER EF TECHNIQUE: Sequential images of the abdomen were obtained out to 60 minutes following intravenous administration of radiopharmaceutical. After oral ingestion of Ensure, gallbladder ejection fraction was determined. At 60 min, normal ejection fraction is greater than 33%. RADIOPHARMACEUTICALS:  5.0 mCi  Tc-29m  Choletec IV COMPARISON:  CT 06/30/2017. FINDINGS: Prompt uptake and biliary excretion of activity by the liver is seen. Small photopenic area in the right lobe liver most likely related to a previously identified abscess. Gallbladder activity is visualized, consistent with patency of cystic duct. Biliary activity passes into small bowel, consistent with patent common bile duct. Calculated gallbladder ejection fraction is 61%. (Normal gallbladder ejection fraction with Ensure is greater than 33%.) IMPRESSION: Normal visualization of the gallbladder. Normal gallbladder ejection fraction. Electronically Signed   By: Marcello Moores  Register   On: 07/05/2017 13:59   Ct Image Guided Drainage By Percutaneous Catheter  Result Date: 06/30/2017 INDICATION: History of hepatic abscesses. Please perform ultrasound/CT-guided hepatic abscess drainage catheter placement. EXAM: ULTRASOUND AND CT-GUIDED HEPATIC ABSCESS DRAINAGE CATHETER PLACEMENT x2 COMPARISON:  CT abdomen pelvis - 06/28/2017 MEDICATIONS: The patient is currently admitted to the hospital and receiving intravenous antibiotics. The antibiotics were administered within an appropriate time frame prior to the initiation of the procedure. ANESTHESIA/SEDATION: Moderate (conscious) sedation was employed during this procedure. A total of Versed 7 mg and Fentanyl 400 mcg, Dilaudid 2 mg IV and Demerol 50 mg IV was administered intravenously. Moderate Sedation Time: 25 minutes. The patient's level of consciousness and vital signs were monitored continuously by radiology nursing throughout the procedure under my direct supervision. CONTRAST:  None COMPLICATIONS: SIR LEVEL B - Normal therapy, includes overnight admission for observation. The patient developed rigors following placement of both hepatic abscess drainage catheters which was treated with the administration of 50 mg of Demerol IV. PROCEDURE: Informed written consent was obtained from the patient after a  discussion of the risks, benefits and alternatives to treatment. The patient was placed supine on the CT gantry and a pre procedural CT was performed re-demonstrating the known hepatic abscesses with dominant ill-defined component within the superolateral aspect the right lobe of the liver measuring approximately 5.3 x 4.9 cm (image 40, series 2), additional ill-defined component with the subcapsular inferior aspect of the right lobe of the liver measuring approximately 5.2 x 4.4 cm (image 63, series 2) an ill-defined slightly hyperattenuating fluid collection within the porta hepatis measuring approximately 8.5 x 4.4 cm (image 66, series 2). All 3 of these fluid collections were identified sonographically. The right superolateral abdomen as well as the midline of the upper abdomen was prepped and draped in usual sterile fashion. The overlying soft tissues were anesthetized with 1% lidocaine with epinephrine. Under direct ultrasound guidance, each hypoechoic collection were sequentially accessed with an 18 gauge trocar needle. Both hepatic samples yielded the aspiration of a small amount of purulent fluid, while the porta hepatis collection  yielded a small amount of bilious appearing fluid. Short Amplatz wires were placed at all locations. CT scanning demonstrated appropriate positioning of the Amplatz wires within both hepatic abscesses, however the wire was not appropriately coiled within the porta hepatis collection. Next, both hepatic abscess drainage catheters were placed after the tracks were serially dilated allowing placement of a 10 [place] all-purpose drainage catheters. As patient was complaining of discomfort lying on the table and then developed rigors, additional intervention was not performed at this time to attempt placement of a drainage catheter into the perihepatic abscess. Limited postprocedural scanning was performed demonstrating appropriate positioning of both hepatic abscess drainage  catheters. Approximately 15 cc of purulent fluid was aspirated from the superolateral abscess while approximately 35 cc was aspirated from the abscess within the anterior aspect the right lobe of the liver. Both drainage catheters were secured at the skin entrance site within interrupted suture and connected to JP bulbs. Dressings were placed. IMPRESSION: 1. Successful ultrasound and CT-guided drainage catheter placement into dominant abscess within the superolateral aspect the right lobe of the liver yielding 15 cc of purulent material. 2. Successful ultrasound and CT-guided drainage catheter placement into dominant abscess within the anterior inferior aspect the right lobe of the liver yielding 35 cc of purulent material. 3. Attempted though ultimately unsuccessful placement of a percutaneous drainage catheter into the ill-defined suspected abscess within the porta hepatis abscess, secondary to patient's inability to tolerate the procedure as well as development of rigors following placement of the 2 additional hepatic abscess drainage catheters. 4. A representative sample of aspirated purulent material was sent to the laboratory for analysis. PLAN: - As the suspected abscess within the dome of the right lobe of the liver and potential abscess within the porta hepatis remain undrained, would have a low threshold to repeat an IV only abdominal CT if patient has infectious symptoms do not defervesce within the next several days, however note, both collections may be NOT amenable to image guided drainage secondary to location and patient body habitus. Electronically Signed   By: Sandi Mariscal M.D.   On: 06/30/2017 14:55   Ct Image Guided Drainage By Percutaneous Catheter  Result Date: 06/30/2017 INDICATION: History of hepatic abscesses. Please perform ultrasound/CT-guided hepatic abscess drainage catheter placement. EXAM: ULTRASOUND AND CT-GUIDED HEPATIC ABSCESS DRAINAGE CATHETER PLACEMENT x2 COMPARISON:  CT  abdomen pelvis - 06/28/2017 MEDICATIONS: The patient is currently admitted to the hospital and receiving intravenous antibiotics. The antibiotics were administered within an appropriate time frame prior to the initiation of the procedure. ANESTHESIA/SEDATION: Moderate (conscious) sedation was employed during this procedure. A total of Versed 7 mg and Fentanyl 400 mcg, Dilaudid 2 mg IV and Demerol 50 mg IV was administered intravenously. Moderate Sedation Time: 25 minutes. The patient's level of consciousness and vital signs were monitored continuously by radiology nursing throughout the procedure under my direct supervision. CONTRAST:  None COMPLICATIONS: SIR LEVEL B - Normal therapy, includes overnight admission for observation. The patient developed rigors following placement of both hepatic abscess drainage catheters which was treated with the administration of 50 mg of Demerol IV. PROCEDURE: Informed written consent was obtained from the patient after a discussion of the risks, benefits and alternatives to treatment. The patient was placed supine on the CT gantry and a pre procedural CT was performed re-demonstrating the known hepatic abscesses with dominant ill-defined component within the superolateral aspect the right lobe of the liver measuring approximately 5.3 x 4.9 cm (image 40, series 2), additional ill-defined component  with the subcapsular inferior aspect of the right lobe of the liver measuring approximately 5.2 x 4.4 cm (image 63, series 2) an ill-defined slightly hyperattenuating fluid collection within the porta hepatis measuring approximately 8.5 x 4.4 cm (image 66, series 2). All 3 of these fluid collections were identified sonographically. The right superolateral abdomen as well as the midline of the upper abdomen was prepped and draped in usual sterile fashion. The overlying soft tissues were anesthetized with 1% lidocaine with epinephrine. Under direct ultrasound guidance, each hypoechoic  collection were sequentially accessed with an 18 gauge trocar needle. Both hepatic samples yielded the aspiration of a small amount of purulent fluid, while the porta hepatis collection yielded a small amount of bilious appearing fluid. Short Amplatz wires were placed at all locations. CT scanning demonstrated appropriate positioning of the Amplatz wires within both hepatic abscesses, however the wire was not appropriately coiled within the porta hepatis collection. Next, both hepatic abscess drainage catheters were placed after the tracks were serially dilated allowing placement of a 10 [place] all-purpose drainage catheters. As patient was complaining of discomfort lying on the table and then developed rigors, additional intervention was not performed at this time to attempt placement of a drainage catheter into the perihepatic abscess. Limited postprocedural scanning was performed demonstrating appropriate positioning of both hepatic abscess drainage catheters. Approximately 15 cc of purulent fluid was aspirated from the superolateral abscess while approximately 35 cc was aspirated from the abscess within the anterior aspect the right lobe of the liver. Both drainage catheters were secured at the skin entrance site within interrupted suture and connected to JP bulbs. Dressings were placed. IMPRESSION: 1. Successful ultrasound and CT-guided drainage catheter placement into dominant abscess within the superolateral aspect the right lobe of the liver yielding 15 cc of purulent material. 2. Successful ultrasound and CT-guided drainage catheter placement into dominant abscess within the anterior inferior aspect the right lobe of the liver yielding 35 cc of purulent material. 3. Attempted though ultimately unsuccessful placement of a percutaneous drainage catheter into the ill-defined suspected abscess within the porta hepatis abscess, secondary to patient's inability to tolerate the procedure as well as development of  rigors following placement of the 2 additional hepatic abscess drainage catheters. 4. A representative sample of aspirated purulent material was sent to the laboratory for analysis. PLAN: - As the suspected abscess within the dome of the right lobe of the liver and potential abscess within the porta hepatis remain undrained, would have a low threshold to repeat an IV only abdominal CT if patient has infectious symptoms do not defervesce within the next several days, however note, both collections may be NOT amenable to image guided drainage secondary to location and patient body habitus. Electronically Signed   By: Sandi Mariscal M.D.   On: 06/30/2017 14:55      Discharge Exam: Vitals:   07/12/17 2211 07/13/17 0500  BP: 135/84 (!) 143/90  Pulse: 85 78  Resp: 18 18  Temp:  98.4 F (36.9 C)  SpO2: 97% 97%   Vitals:   07/12/17 0505 07/12/17 1353 07/12/17 2211 07/13/17 0500  BP: 138/80 139/90 135/84 (!) 143/90  Pulse: 81 71 85 78  Resp: 18 18 18 18   Temp: 97.6 F (36.4 C) 98.9 F (37.2 C)  98.4 F (36.9 C)  TempSrc: Oral Oral  Oral  SpO2: 100% 100% 97% 97%  Weight:      Height:        General: Pt is alert, awake, not in  acute distress Cardiovascular: RRR, S1/S2 +, no rubs, no gallops Respiratory: CTA bilaterally, no wheezing, no rhonchi Abdominal: Obese, soft, NT, ND, bowel sounds + Extremities: no edema.   The results of significant diagnostics from this hospitalization (including imaging, microbiology, ancillary and laboratory) are listed below for reference.     Microbiology: Recent Results (from the past 240 hour(s))  Culture, blood (routine x 2)     Status: None   Collection Time: 07/05/17  4:52 PM  Result Value Ref Range Status   Specimen Description BLOOD LEFT HAND  Final   Special Requests IN PEDIATRIC BOTTLE Blood Culture adequate volume  Final   Culture NO GROWTH 5 DAYS  Final   Report Status 07/10/2017 FINAL  Final  Culture, blood (routine x 2)     Status: None    Collection Time: 07/05/17  4:55 PM  Result Value Ref Range Status   Specimen Description BLOOD LEFT ARM  Final   Special Requests IN PEDIATRIC BOTTLE Blood Culture adequate volume  Final   Culture NO GROWTH 5 DAYS  Final   Report Status 07/10/2017 FINAL  Final     Labs: BNP (last 3 results) No results for input(s): BNP in the last 8760 hours. Basic Metabolic Panel: Recent Labs  Lab 07/08/17 0541 07/10/17 2005  NA 136 134*  K 4.2 3.9  CL 102 103  CO2 27 26  GLUCOSE 149* 137*  BUN 7 5*  CREATININE 0.87 0.90  CALCIUM 8.5* 8.1*   Liver Function Tests: No results for input(s): AST, ALT, ALKPHOS, BILITOT, PROT, ALBUMIN in the last 168 hours. No results for input(s): LIPASE, AMYLASE in the last 168 hours. No results for input(s): AMMONIA in the last 168 hours. CBC: Recent Labs  Lab 07/08/17 0541 07/10/17 0356 07/11/17 0325 07/12/17 0356  WBC 17.7* 17.7* 16.6* 13.5*  NEUTROABS 12.4* 12.9*  --  9.7*  HGB 11.0* 10.0* 10.0* 9.7*  HCT 35.0* 32.6* 32.3* 31.4*  MCV 82.4 83.0 82.6 82.2  PLT 535* 555* 561* 505*   Cardiac Enzymes: No results for input(s): CKTOTAL, CKMB, CKMBINDEX, TROPONINI in the last 168 hours. BNP: Invalid input(s): POCBNP CBG: Recent Labs  Lab 07/12/17 1208 07/12/17 1711 07/12/17 2154 07/13/17 0732 07/13/17 1204  GLUCAP 97 113* 174* 157* 143*   D-Dimer No results for input(s): DDIMER in the last 72 hours. Hgb A1c No results for input(s): HGBA1C in the last 72 hours. Lipid Profile No results for input(s): CHOL, HDL, LDLCALC, TRIG, CHOLHDL, LDLDIRECT in the last 72 hours. Thyroid function studies No results for input(s): TSH, T4TOTAL, T3FREE, THYROIDAB in the last 72 hours.  Invalid input(s): FREET3 Anemia work up No results for input(s): VITAMINB12, FOLATE, FERRITIN, TIBC, IRON, RETICCTPCT in the last 72 hours. Urinalysis    Component Value Date/Time   COLORURINE AMBER (A) 06/28/2017 1828   APPEARANCEUR CLEAR 06/28/2017 1828   LABSPEC 1.040  (H) 06/28/2017 1828   PHURINE 6.0 06/28/2017 1828   GLUCOSEU 50 (A) 06/28/2017 1828   HGBUR NEGATIVE 06/28/2017 1828   BILIRUBINUR SMALL (A) 06/28/2017 1828   BILIRUBINUR LARGE 06/28/2017 1155   [place] 06/28/2017 1828   PROTEINUR 30 (A) 06/28/2017 1828   UROBILINOGEN 2.0 (A) 06/28/2017 1155   NITRITE NEGATIVE 06/28/2017 1828   LEUKOCYTESUR NEGATIVE 06/28/2017 1828   Sepsis Labs Invalid input(s): PROCALCITONIN,  WBC,  LACTICIDVEN Microbiology Recent Results (from the past 240 hour(s))  Culture, blood (routine x 2)     Status: None   Collection Time: 07/05/17  4:52 PM  Result Value Ref Range Status   Specimen Description BLOOD LEFT HAND  Final   Special Requests IN PEDIATRIC BOTTLE Blood Culture adequate volume  Final   Culture NO GROWTH 5 DAYS  Final   Report Status 07/10/2017 FINAL  Final  Culture, blood (routine x 2)     Status: None   Collection Time: 07/05/17  4:55 PM  Result Value Ref Range Status   Specimen Description BLOOD LEFT ARM  Final   Special Requests IN PEDIATRIC BOTTLE Blood Culture adequate volume  Final   Culture NO GROWTH 5 DAYS  Final   Report Status 07/10/2017 FINAL  Final     Time coordinating discharge: 32 minutes  SIGNED:  Chipper Oman, MD  [facility] 07/13/2017, 2:39 PM  Pager please text page via  [email or web address] Password [email or web address]

## 2017-07-13 NOTE — Progress Notes (Signed)
Referring Physician(s): Dr. Georgette Dover  Supervising Physician: Marybelle Killings  Patient Status:  [facility] - In-pt  Chief Complaint: Hepatic abscesses  Subjective: Ready to go home. Feeling better.   Allergies: Patient has no known allergies.  Medications: Prior to Admission medications   Medication Sig Start Date End Date Taking? Authorizing Provider  oxyCODONE-acetaminophen (PERCOCET) 10-325 MG tablet Take 1 tablet by mouth every 4 (four) hours as needed for pain. 06/15/17  Yes Sanjuana Kava, MD  albuterol (PROVENTIL HFA;VENTOLIN HFA) 108 (90 Base) MCG/ACT inhaler Inhale 2 puffs into the lungs every 6 (six) hours as needed for wheezing or shortness of breath. 06/28/17   [name], PA-C  benzonatate (TESSALON PERLES) 100 MG capsule 1-2 po q 8 hour prn cough Patient taking differently: Take 100-200 mg by mouth 3 (three) times daily as needed for cough. 1-2 po q 8 hour prn cough 06/28/17   [name], PA-C  cephALEXin (KEFLEX) 500 MG capsule Take 1 capsule (500 mg total) by mouth 4 (four) times daily. 06/28/17   [name], PA-C  fluconazole (DIFLUCAN) 200 MG tablet Take 2 tablets (400 mg total) daily by mouth. 07/14/17   Doreatha Lew, MD  insulin aspart protamine- aspart (NOVOLOG MIX 70/30) (70-30) 100 UNIT/ML injection Inject 0.1 mLs (10 Units total) 2 (two) times daily with a meal into the skin. 07/13/17   Doreatha Lew, MD  Insulin Syringe-Needle U-100 29G X 1/2" 0.5 ML MISC Use as indicated 07/13/17   Patrecia Pour, Christean Grief, MD  metFORMIN (GLUCOPHAGE) 1000 MG tablet Take 1 tablet (1,000 mg total) 2 (two) times daily with a meal by mouth. 07/13/17   Doreatha Lew, MD     Vital Signs: BP (!) 143/90 (BP Location: Left Wrist)   Pulse 78   Temp 98.4 F (36.9 C) (Oral)   Resp 18   Ht 5\' 8"  (1.727 m)   Wt (!) 377 lb (171 kg)   SpO2 97%   BMI 57.32 kg/m   Physical Exam  NAD, alert Abd:  2 hepatic drains in place.  Insertion site intact, although with  some breakdown of skin.  Very little output from drains over the past several days.  Imaging: Ct Abdomen W Contrast  Result Date: 07/11/2017 CLINICAL DATA:  Abscesses. EXAM: CT ABDOMEN WITH CONTRAST TECHNIQUE: Multidetector CT imaging of the abdomen was performed using the standard protocol following bolus administration of intravenous contrast. CONTRAST:  157mL ISOVUE-300 IOPAMIDOL (ISOVUE-300) INJECTION 61% COMPARISON:  CT scan of July 05, 2017. FINDINGS: Lower chest: No acute abnormality. Hepatobiliary: No gallstones are noted. Stable position of drainage catheter in the right hepatic lobe adjacent to 2 cm fluid collection containing a small amount of air. Stable position of another drainage catheter seen more inferiorly in right hepatic lobe with no significant fluid collection around it. Just inferior to this catheter, 3.6 x 2.7 cm subcapsular fluid collection is again noted and stable. Complex fluid collection is again noted in the dome of the right hepatic lobe now measuring 5.0 x 2.5 cm which is not significantly changed compared to prior exam. Abscess seen in gallbladder fossa is significantly smaller compared to prior exam, now measuring 6.8 x 4.6 cm which is decreased compared to prior exam. Pancreas: Unremarkable. No pancreatic ductal dilatation or surrounding inflammatory changes. Spleen: Normal in size without focal abnormality. Adrenals/Urinary Tract: Adrenal glands and kidneys appear normal. No hydronephrosis or renal obstruction is noted. No renal calculi are noted. Stomach/Bowel: The stomach and visualized bowel are unremarkable. Vascular/Lymphatic:  No significant vascular abnormality is noted. No significant adenopathy is noted. Other: No definite hernia is noted. No abnormal fluid collection is noted. Musculoskeletal: No acute or significant osseous findings. IMPRESSION: Stable position of 2 drainage catheters in right hepatic lobe. Stable size and appearance of most hepatic abscesses is  noted compared to prior exam. Abscess seen in gallbladder fossa is significantly smaller compared to prior exam. Electronically Signed   By: Marijo Conception, M.D.   On: 07/11/2017 07:41    Labs:  CBC: Recent Labs    07/08/17 0541 07/10/17 0356 07/11/17 0325 07/12/17 0356  WBC 17.7* 17.7* 16.6* 13.5*  HGB 11.0* 10.0* 10.0* 9.7*  HCT 35.0* 32.6* 32.3* 31.4*  PLT 535* 555* 561* 505*    COAGS: Recent Labs    06/30/17 0709 07/11/17 0325  INR 1.24 1.19    BMP: Recent Labs    07/03/17 0235 07/05/17 0605 07/08/17 0541 07/10/17 2005  NA 137 137 136 134*  K 3.9 4.2 4.2 3.9  CL 102 103 102 103  CO2 26 26 27 26   GLUCOSE 192* 156* 149* 137*  BUN 5* 8 7 5*  CALCIUM 8.5* 8.7* 8.5* 8.1*  CREATININE 0.74 0.79 0.87 0.90  GFRNONAA >60 >60 >60 >60  GFRAA >60 >60 >60 >60    LIVER FUNCTION TESTS: Recent Labs    06/28/17 1232 06/30/17 0709 07/02/17 0522 07/03/17 0235 07/05/17 0605  BILITOT 3.3* 1.7* 0.9  --  1.0  AST 115* 38 25  --  19  ALT 73* 40 27  --  16*  ALKPHOS 474* 406* 337*  --  294*  PROT 7.9 7.5 7.8  --  7.4  ALBUMIN 2.2* 1.7* 1.6*  1.6* 1.6* 1.8*    Assessment and Plan: Hepatic abscesses s/p drain placement 10/25 Patient's current drains have had little output over the past several days.  Patient was considered for additional drain placement yesterday, however per Dr. Vernard Gambles, improvement in interval scans and clinical status with IV abx and no additional drains were placed.  Discussed case with IR MD today.  Ok to remove existing drains.  Drains were removed by PA and dressing applied.  Discussed home care of insertion sites with patient.  Patient will not need follow-up in IR drain clinic as his drains have been removed.  For possible d/c today.   Electronically Signed: Docia Barrier, PA 07/13/2017, 2:01 PM   I spent a total of 15 Minutes at the the patient's bedside AND on the patient's hospital floor or unit, greater than 50% of which was  counseling/coordinating care for hepatic abscesses.

## 2017-07-13 NOTE — Progress Notes (Signed)
PT Cancellation Note  Patient Details Name: DEANGLEO PASSAGE MRN: [id number] DOB: Apr 21, 1966   Cancelled Treatment:    Reason Eval/Treat Not Completed: Patient declined, no reason specified.  Patient in process of being discharged and declined PT today.  Will have f/u HHPT. Patient and wife with no questions or concerns.   Despina Pole 07/13/2017, 3:44 PM Carita Pian. Sanjuana Kava, [place] Pager [phone]

## 2017-07-13 NOTE — Care Management Note (Addendum)
Case Management Note  Patient Details  Name: LENIN KUHNLE MRN: [id number] Date of Birth: [birth year]  Subjective/Objective:                    Action/Plan: Can provide [place] letter at discharge.  Expected Discharge Date:                  Expected Discharge Plan:  [place]  In-House Referral:     Discharge planning Services  CM Consult  Post Acute Care Choice:  Home Health Choice offered to:  Patient  DME Arranged:    DME Agency:     HH Arranged:  RN [place] Agency:  [place]  Status of Service:  Completed, signed off  If discussed at [place] of Stay Meetings, dates discussed:    Additional Comments:  Marilu Favre, RN 07/13/2017, 11:11 AM

## 2017-07-13 NOTE — Progress Notes (Signed)
Pt being discharged home via wheelchair with family. Pt alert and oriented x4. VSS. Pt c/o no pain at this time. No signs of respiratory distress. Education complete and care plans resolved. Pt sent home with PICC for IV ABX. Home health set up and educated. No further issues at this time. Pt to follow up with PCP. Leanne Chang, RN

## 2017-07-13 NOTE — Care Management Note (Signed)
Case Management Note  Patient Details  Name: [name] MRN: [id number] Date of Birth: [birth year]  Subjective/Objective:                    Action/Plan:   Expected Discharge Date:  07/13/17               Expected Discharge Plan:  [place]  In-House Referral:     Discharge planning Services  CM Consult, [facility], [facility] Program, Medication Assistance  Post Acute Care Choice:  Home Health Choice offered to:  Patient  DME Arranged:    DME Agency:     HH Arranged:  RN [place] Agency:  [place]  Status of Service:  Completed, signed off  If discussed at [place] of Stay Meetings, dates discussed:    Additional Comments:  Marilu Favre, RN 07/13/2017, 2:40 PM

## 2017-07-13 NOTE — Progress Notes (Signed)
PHARMACY CONSULT NOTE FOR:  OUTPATIENT  PARENTERAL ANTIBIOTIC THERAPY (OPAT)  Indication: polymicrobial hepatic abscess plus strep bacteremia Regimen: Unasyn 3gm IV Q6H + fluconazol e 400mg  PO daily End date: 4 wks of treatment per MD note >> 07/26/17  IV antibiotic discharge orders are pended. To discharging provider:  please sign these orders via discharge navigator,  Select New Orders & click on the button choice - Manage This Unsigned Work.     Donavin Audino D. Mina Marble, PharmD, BCPS Pager:  [phone] 07/13/2017, 2:02 PM

## 2017-07-14 ENCOUNTER — Inpatient Hospital Stay: Payer: Self-pay | Admitting: Internal Medicine

## 2017-07-18 ENCOUNTER — Other Ambulatory Visit: Payer: Self-pay | Admitting: Physician Assistant

## 2017-07-18 ENCOUNTER — Telehealth: Payer: Self-pay | Admitting: Orthopedic Surgery

## 2017-07-18 DIAGNOSIS — R748 Abnormal levels of other serum enzymes: Secondary | ICD-10-CM

## 2017-07-18 DIAGNOSIS — K750 Abscess of liver: Secondary | ICD-10-CM

## 2017-07-18 DIAGNOSIS — I10 Essential (primary) hypertension: Secondary | ICD-10-CM

## 2017-07-18 DIAGNOSIS — E1165 Type 2 diabetes mellitus with hyperglycemia: Secondary | ICD-10-CM

## 2017-07-18 DIAGNOSIS — R7881 Bacteremia: Secondary | ICD-10-CM

## 2017-07-18 MED ORDER — OXYCODONE-ACETAMINOPHEN 10-325 MG PO TABS
1.0000 | ORAL_TABLET | ORAL | 0 refills | Status: DC | PRN
Start: 2017-07-18 — End: 2017-08-17

## 2017-07-18 NOTE — Telephone Encounter (Signed)
Refill request for Oxycodone/Acetaminophen 10-325   Mgs.   Qty  150   Sig: Take 1 tablet by mouth every 4 (four) hours as needed for pain.

## 2017-07-20 ENCOUNTER — Other Ambulatory Visit: Payer: Self-pay | Admitting: Pharmacist

## 2017-07-21 ENCOUNTER — Ambulatory Visit: Payer: Self-pay | Admitting: Physician Assistant

## 2017-07-21 ENCOUNTER — Telehealth: Payer: Self-pay | Admitting: Student

## 2017-07-21 ENCOUNTER — Encounter: Payer: Self-pay | Admitting: Physician Assistant

## 2017-07-21 VITALS — BP 148/90 | HR 99 | Temp 97.3°F | Ht 65.5 in | Wt 369.0 lb

## 2017-07-21 DIAGNOSIS — L84 Corns and callosities: Secondary | ICD-10-CM

## 2017-07-21 DIAGNOSIS — Z6841 Body Mass Index (BMI) 40.0 and over, adult: Secondary | ICD-10-CM

## 2017-07-21 DIAGNOSIS — D649 Anemia, unspecified: Secondary | ICD-10-CM

## 2017-07-21 DIAGNOSIS — I422 Other hypertrophic cardiomyopathy: Secondary | ICD-10-CM

## 2017-07-21 DIAGNOSIS — R609 Edema, unspecified: Secondary | ICD-10-CM

## 2017-07-21 DIAGNOSIS — E1165 Type 2 diabetes mellitus with hyperglycemia: Secondary | ICD-10-CM

## 2017-07-21 DIAGNOSIS — I5189 Other ill-defined heart diseases: Secondary | ICD-10-CM

## 2017-07-21 DIAGNOSIS — K750 Abscess of liver: Secondary | ICD-10-CM

## 2017-07-21 DIAGNOSIS — I10 Essential (primary) hypertension: Secondary | ICD-10-CM

## 2017-07-21 DIAGNOSIS — L602 Onychogryphosis: Secondary | ICD-10-CM

## 2017-07-21 DIAGNOSIS — E781 Pure hyperglyceridemia: Secondary | ICD-10-CM

## 2017-07-21 MED ORDER — LISINOPRIL 10 MG PO TABS: 10.0000 mg | ORAL_TABLET | Freq: Every day | ORAL | 1 refills | Status: DC

## 2017-07-21 NOTE — Telephone Encounter (Signed)
Pt is calling because he was instructed to call the office if his blood sugars were less than 70 or more than 300. Pt states he checked his blood sugar about 45 minutes ago (around 2:30PM) and had blood sugar reading of 67. Pt states he only ate a banana and drank some juice this morning and had not had anything to eat since.  Pt states he just ate Barbeque, fries, and juice after checking blood sugar. Pt was advised that his blood sugar was probably low due to he had not had anything to eat since the morning. Pt was recommended to recheck his blood sugar.   Pt states he has checked it again and it is now in the 140s. Pt was advised to eat 3 meals a day, to snack in between meals, and to not skip any meals as not eating could cause his sugars to drop. Pt was reminded to call back if sugar was <70 or >300. Pt verbalized understanding.

## 2017-07-21 NOTE — Progress Notes (Signed)
BP (!) 148/90 (BP Location: Left Arm, Patient Position: Sitting, Cuff Size: Large)   Pulse 99   Temp (!) 97.3 F (36.3 C) (Other (Comment))   Ht 5' 5.5" (1.664 m)   Wt (!) 369 lb (167.4 kg)   SpO2 99%   BMI 60.47 kg/m    Subjective:    Patient ID: [name], male    DOB: [birth year], 51 y.o.   MRN: [id number]  HPI: [name] is a 51 y.o. male presenting on 07/21/2017 for Follow-up   HPI  Pt says he is doing much better today.  Still weak and tired, but overall good  Pt was inpatient recentlty for hepatic abscesses.  He has f/u appt with ID on 11/28  Labs done 06/28/17 -  a1c-13.4, triglycerides 238, tsh nl.  Labs done 07/19/17 - Alk Pose 151, Hg 10.4 , platelets 529, ESR 46, CRP 55.5.  Results reviewed with pt  Pt is getting around okay.  Pt has home health that comes to the house for management of PICC line.   Pt applied for medicaid while in hospital.  He hasn't heard on it yet.  He also filled out [employer] application  Pt states long time since last eye exam  Pt is having some loose stool  Relevant past medical, surgical, family and social history reviewed and updated as indicated. Interim medical history since our last visit reviewed. Allergies and medications reviewed and updated.   Current Outpatient Medications:  .  ampicillin-sulbactam (UNASYN) 3 (2-1) g injection, Inject 3 g every 6 (six) hours for 13 days into the vein., Disp: 156 g, Rfl: 0 .  ferrous sulfate 325 (65 FE) MG EC tablet, Take 1 tablet (325 mg total) 2 (two) times daily by mouth., Disp: 60 tablet, Rfl: 0 .  fluconazole (DIFLUCAN) 200 MG tablet, Take 2 tablets (400 mg total) daily by mouth., Disp: 18 tablet, Rfl: 0 .  insulin aspart protamine- aspart (NOVOLOG MIX 70/30) (70-30) 100 UNIT/ML injection, Inject 0.1 mLs (10 Units total) 2 (two) times daily with a meal into the skin., Disp: 10 mL, Rfl: 0 .  Insulin Syringe-Needle U-100 29G X 1/2" 0.5 ML MISC, Use as indicated, Disp: 100 each,  Rfl: 0 .  metFORMIN (GLUCOPHAGE) 1000 MG tablet, Take 1 tablet (1,000 mg total) 2 (two) times daily with a meal by mouth., Disp: 180 tablet, Rfl: 0 .  oxyCODONE-acetaminophen (PERCOCET) 10-325 MG tablet, Take 1 tablet every 4 (four) hours as needed by mouth for pain. Must last 30 days., Disp: 150 tablet, Rfl: 0   Review of Systems  Constitutional: Negative for appetite change, chills, diaphoresis, fatigue, fever and unexpected weight change.  HENT: Negative for congestion, dental problem, drooling, ear pain, facial swelling, hearing loss, mouth sores, sneezing, sore throat, trouble swallowing and voice change.   Eyes: Negative for pain, discharge, redness, itching and visual disturbance.  Respiratory: Negative for cough, choking, shortness of breath and wheezing.   Cardiovascular: Negative for chest pain, palpitations and leg swelling.  Gastrointestinal: Negative for abdominal pain, blood in stool, constipation, diarrhea and vomiting.  Endocrine: Negative for cold intolerance, heat intolerance and polydipsia.  Genitourinary: Negative for decreased urine volume, dysuria and hematuria.  Musculoskeletal: Negative for arthralgias, back pain and gait problem.  Skin: Negative for rash.  Allergic/Immunologic: Negative for environmental allergies.  Neurological: Negative for seizures, syncope, light-headedness and headaches.  Hematological: Negative for adenopathy.  Psychiatric/Behavioral: Negative for agitation, dysphoric mood and suicidal ideas. The patient is not nervous/anxious.  Per HPI unless specifically indicated above     Objective:    BP (!) 148/90 (BP Location: Left Arm, Patient Position: Sitting, Cuff Size: Large)   Pulse 99   Temp (!) 97.3 F (36.3 C) (Other (Comment))   Ht 5' 5.5" (1.664 m)   Wt (!) 369 lb (167.4 kg)   SpO2 99%   BMI 60.47 kg/m   Wt Readings from Last 3 Encounters:  07/21/17 (!) 369 lb (167.4 kg)  07/08/17 (!) 377 lb (171 kg)  06/28/17 (!) 375 lb 12 oz  (170.4 kg)    Physical Exam  Constitutional: He is oriented to person, place, and time. He appears well-developed and well-nourished.  HENT:  Head: Normocephalic and atraumatic.  Eyes:  Jaundiced conjunctiva resolved  Neck: Neck supple.  Cardiovascular: Normal rate and regular rhythm.  Pulmonary/Chest: Effort normal and breath sounds normal. No respiratory distress. He has no wheezes. He has no rhonchi. He has no rales.  Abdominal: Soft. Bowel sounds are normal. There is no hepatosplenomegaly. There is no tenderness.  Drain holes healing without purulence or redness.  Area cleaned and new dry sterile dressings applied.   Musculoskeletal: He exhibits edema (BLE).  PICC line in place RUE.  No problems seen (no redness, swelling)  Lymphadenopathy:    He has no cervical adenopathy.  Neurological: He is alert and oriented to person, place, and time.  Skin: Skin is warm and dry.  Psychiatric: He has a normal mood and affect. His behavior is normal.  Vitals reviewed.         Assessment & Plan:    Encounter Diagnoses  Name Primary?  [name] Uncontrolled type 2 diabetes mellitus with hyperglycemia ([place]) Yes  . Essential hypertension   . Hypertriglyceridemia   . Morbid obesity ([place])   . BMI 60.0-69.9, adult ([place])   . Diastolic dysfunction   . Hepatic abscess   . Anemia, unspecified type   . Overgrown toenails   . Callus of foot   . Other hypertrophic cardiomyopathy ([place])   . Edema, unspecified type      -pt to Continue iron for anemia.    -will Add lisinopril for htn  -will get pt signed up for [facility]- (order lisinopril, metformin, januvia)  -pt to continue insulin 70/30 for now.  When pt gets [place], he is to discontinue the insulin and start the januvia qd and monitor fbs (check qam).  Pt to continue the metformin. Pt counseled to Call office for fbs > 300 or < 70.  -Pt to continue with ID for liver abscesses/antibiotics  -will Refer for DM eye exam.   -Will defer  iFOBT until next OV due to diarrhea - likely due to antibiotics.  Will test for c dif if worsens or persists  -pt had Echo in hospital- grade 1 diastolic dysfunction and septal hypertrophy  -Refer to podiatrist for excessive toenails and callus  -pt will need fish oil for lipids  -pt to follow up 3-4 weeks to recheck htn, dm.  He is to RTO sooner prn

## 2017-08-03 ENCOUNTER — Ambulatory Visit (INDEPENDENT_AMBULATORY_CARE_PROVIDER_SITE_OTHER): Payer: Self-pay | Admitting: Internal Medicine

## 2017-08-03 ENCOUNTER — Telehealth: Payer: Self-pay | Admitting: *Deleted

## 2017-08-03 ENCOUNTER — Encounter: Payer: Self-pay | Admitting: Internal Medicine

## 2017-08-03 VITALS — BP 168/109 | HR 89 | Temp 98.0°F | Wt 374.0 lb

## 2017-08-03 DIAGNOSIS — K750 Abscess of liver: Secondary | ICD-10-CM

## 2017-08-03 DIAGNOSIS — R1011 Right upper quadrant pain: Secondary | ICD-10-CM

## 2017-08-03 DIAGNOSIS — Z95828 Presence of other vascular implants and grafts: Secondary | ICD-10-CM | POA: Insufficient documentation

## 2017-08-03 DIAGNOSIS — Z5181 Encounter for therapeutic drug level monitoring: Secondary | ICD-10-CM | POA: Insufficient documentation

## 2017-08-03 MED ORDER — AMOXICILLIN-POT CLAVULANATE 500-125 MG PO TABS: 1.0000 | ORAL_TABLET | Freq: Three times a day (TID) | ORAL | 1 refills | Status: DC

## 2017-08-03 NOTE — Telephone Encounter (Signed)
Call from [place] with [facility] to find out the next step with patient's IV antibiotics. Per Dr. Linus Salmons patient has five more doses and then the picc can be pulled. She then asked if it would be pulled here. I explained that would be pulled by home health nurse. Also that the nurse with Dr. Linus Salmons would typically call [facility] with this order.

## 2017-08-03 NOTE — Assessment & Plan Note (Signed)
Will finish his remaining doses and pull picc line. HH informed

## 2017-08-03 NOTE — Progress Notes (Signed)
   Subjective:    Patient ID: [name], male    DOB: [birth year], 51 y.o.   MRN: [id number]  HPI Here for follow up of hepatic abscess. Had abscess with drainage and bacteremia with Strep group F and gram stain with GPC, Candida and sent out on amp sulbactam and fluconazole.  He has been on this about 1 month.  No fever, no chills.  Some crampy abdominal pain.  ESR 65, CRP 70.3.  He denies any missed doses.  Feels better overall though has noted some night sweats.  No issues at drain site.  No diarrhea.  Some loose stool yesterday x 1.  No associated rash.    Review of Systems  Constitutional: Negative for chills and fever.  Gastrointestinal: Negative for diarrhea and nausea.  Skin: Negative for rash.       Objective:   Physical Exam  Constitutional: He appears well-developed and well-nourished. No distress.  HENT:  Mouth/Throat: No oropharyngeal exudate.  Eyes: No scleral icterus.  Cardiovascular: Normal rate, regular rhythm and normal heart sounds.  No murmur heard. Pulmonary/Chest: Effort normal and breath sounds normal. No respiratory distress.  Skin: No rash noted.   SH: remote smoking history       Assessment & Plan:

## 2017-08-03 NOTE — Assessment & Plan Note (Signed)
Creat good, no leukopenia

## 2017-08-03 NOTE — Assessment & Plan Note (Addendum)
I will recheck a CT and transition him to oral Augmentin unless completely resolved will stop altogether.  CRP and ESR up and WBC 14 though.  He will finish his IV and pull picc regarless.  Follow up in 4 weeks.

## 2017-08-04 ENCOUNTER — Other Ambulatory Visit: Payer: Self-pay | Admitting: Physician Assistant

## 2017-08-04 ENCOUNTER — Other Ambulatory Visit: Payer: Self-pay | Admitting: Pharmacist

## 2017-08-04 MED ORDER — METFORMIN HCL 1000 MG PO TABS: 1000.0000 mg | ORAL_TABLET | Freq: Two times a day (BID) | ORAL | 1 refills | Status: DC

## 2017-08-04 MED ORDER — LISINOPRIL 20 MG PO TABS: 20.0000 mg | ORAL_TABLET | Freq: Every day | ORAL | 1 refills | Status: DC

## 2017-08-04 MED ORDER — SITAGLIPTIN PHOSPHATE 100 MG PO TABS: 100.0000 mg | ORAL_TABLET | Freq: Every day | ORAL | 1 refills | Status: DC

## 2017-08-08 ENCOUNTER — Ambulatory Visit: Payer: Self-pay | Admitting: Obstetrics and Gynecology

## 2017-08-11 ENCOUNTER — Ambulatory Visit (HOSPITAL_COMMUNITY)
Admission: RE | Admit: 2017-08-11 | Discharge: 2017-08-11 | Disposition: A | Discharge status: Home / Self Care | Payer: Self-pay | Source: Ambulatory Visit | Attending: Internal Medicine | Admitting: Internal Medicine

## 2017-08-11 ENCOUNTER — Ambulatory Visit: Payer: Self-pay | Admitting: Physician Assistant

## 2017-08-11 ENCOUNTER — Encounter: Payer: Self-pay | Admitting: Physician Assistant

## 2017-08-11 ENCOUNTER — Other Ambulatory Visit: Payer: Self-pay | Admitting: Physician Assistant

## 2017-08-11 VITALS — BP 152/88 | HR 93 | Ht 65.5 in | Wt 361.0 lb

## 2017-08-11 DIAGNOSIS — I10 Essential (primary) hypertension: Secondary | ICD-10-CM

## 2017-08-11 DIAGNOSIS — E1165 Type 2 diabetes mellitus with hyperglycemia: Secondary | ICD-10-CM

## 2017-08-11 DIAGNOSIS — D649 Anemia, unspecified: Secondary | ICD-10-CM

## 2017-08-11 DIAGNOSIS — K750 Abscess of liver: Secondary | ICD-10-CM | POA: Insufficient documentation

## 2017-08-11 DIAGNOSIS — Z1211 Encounter for screening for malignant neoplasm of colon: Secondary | ICD-10-CM

## 2017-08-11 DIAGNOSIS — E785 Hyperlipidemia, unspecified: Secondary | ICD-10-CM

## 2017-08-11 DIAGNOSIS — R1011 Right upper quadrant pain: Secondary | ICD-10-CM | POA: Insufficient documentation

## 2017-08-11 MED ORDER — IOPAMIDOL (ISOVUE-300) INJECTION 61%
100.0000 mL | Freq: Once | INTRAVENOUS | Status: AC | PRN
  Administered 2017-08-11: 100 mL via INTRAVENOUS

## 2017-08-11 NOTE — Progress Notes (Signed)
BP (!) 152/88 (BP Location: Left Arm, Patient Position: Sitting, Cuff Size: Large)   Pulse 93   Ht 5' 5.5" (1.664 m)   Wt (!) 361 lb (163.7 kg)   SpO2 99%   BMI 59.16 kg/m    Subjective:    Patient ID: [name], male    DOB: Nov 17, 1965, 51 y.o.   MRN: [id number]  HPI: JYAIR KIRALY is a 51 y.o. male presenting on 08/11/2017 for Diabetes and Hypertension   HPI   Pt says he has not heard on his medicaid application yet.   Pt says he has not gotten his [place] yet. So he is still using his insulin.   Pt did not get his lisinopril from [facility] for his blood pressure   Pt has lost some weight- he has cut back on his calorie intake  Pt says his bs is running around 120-140 in the morning.  He did not bring bs log.   Pt states diarrhea gone  Pt had his PICC line removed.  He is continuing with ID specialist for his liver abscesses.   Pt says he is feeling much better.   Relevant past medical, surgical, family and social history reviewed and updated as indicated. Interim medical history since our last visit reviewed. Allergies and medications reviewed and updated.   Current Outpatient Medications:  .  amoxicillin-clavulanate (AUGMENTIN) 500-125 MG tablet, Take 1 tablet (500 mg total) by mouth 3 (three) times daily., Disp: 42 tablet, Rfl: 1 .  ferrous sulfate 325 (65 FE) MG EC tablet, Take 1 tablet (325 mg total) 2 (two) times daily by mouth., Disp: 60 tablet, Rfl: 0 .  insulin aspart protamine- aspart (NOVOLOG MIX 70/30) (70-30) 100 UNIT/ML injection, Inject 0.1 mLs (10 Units total) 2 (two) times daily with a meal into the skin., Disp: 10 mL, Rfl: 0 .  Insulin Syringe-Needle U-100 29G X 1/2" 0.5 ML MISC, Use as indicated, Disp: 100 each, Rfl: 0 .  metFORMIN (GLUCOPHAGE) 1000 MG tablet, Take 1 tablet (1,000 mg total) by mouth 2 (two) times daily with a meal., Disp: 180 tablet, Rfl: 1 .  oxyCODONE-acetaminophen (PERCOCET) 10-325 MG tablet, Take 1 tablet every 4 (four) hours as  needed by mouth for pain. Must last 30 days., Disp: 150 tablet, Rfl: 0 .  ampicillin-sulbactam (UNASYN) IVPB, Inject 3 g into the vein every 6 (six) hours., Disp: , Rfl:  .  fluconazole (DIFLUCAN) 200 MG tablet, Take 2 tablets (400 mg total) daily by mouth. (Patient not taking: Reported on 08/11/2017), Disp: 18 tablet, Rfl: 0 .  lisinopril (PRINIVIL,ZESTRIL) 10 MG tablet, Take 1 tablet (10 mg total) daily by mouth. (Patient not taking: Reported on 08/11/2017), Disp: 30 tablet, Rfl: 1 .  lisinopril (PRINIVIL,ZESTRIL) 20 MG tablet, Take 1 tablet (20 mg total) by mouth daily. (Patient not taking: Reported on 08/11/2017), Disp: 90 tablet, Rfl: 1 .  sitaGLIPtin (JANUVIA) 100 MG tablet, Take 1 tablet (100 mg total) by mouth daily. (Patient not taking: Reported on 08/11/2017), Disp: 90 tablet, Rfl: 1   Review of Systems  Constitutional: Negative for appetite change, chills, diaphoresis, fatigue, fever and unexpected weight change.  HENT: Negative for congestion, dental problem, drooling, ear pain, facial swelling, hearing loss, mouth sores, sneezing, sore throat, trouble swallowing and voice change.   Eyes: Negative for pain, discharge, redness, itching and visual disturbance.  Respiratory: Negative for cough, choking, shortness of breath and wheezing.   Cardiovascular: Negative for chest pain, palpitations and leg swelling.  Gastrointestinal:  Negative for abdominal pain, blood in stool, constipation, diarrhea and vomiting.  Endocrine: Negative for cold intolerance, heat intolerance and polydipsia.  Genitourinary: Negative for decreased urine volume, dysuria and hematuria.  Musculoskeletal: Negative for arthralgias, back pain and gait problem.  Skin: Negative for rash.  Allergic/Immunologic: Negative for environmental allergies.  Neurological: Negative for seizures, syncope, light-headedness and headaches.  Hematological: Negative for adenopathy.  Psychiatric/Behavioral: Negative for agitation, dysphoric  mood and suicidal ideas. The patient is not nervous/anxious.     Per HPI unless specifically indicated above     Objective:    BP (!) 152/88 (BP Location: Left Arm, Patient Position: Sitting, Cuff Size: Large)   Pulse 93   Ht 5' 5.5" (1.664 m)   Wt (!) 361 lb (163.7 kg)   SpO2 99%   BMI 59.16 kg/m   Wt Readings from Last 3 Encounters:  08/11/17 (!) 361 lb (163.7 kg)  08/03/17 (!) 374 lb (169.6 kg)  07/21/17 (!) 369 lb (167.4 kg)    Physical Exam  Constitutional: He is oriented to person, place, and time. He appears well-developed and well-nourished.  HENT:  Head: Normocephalic and atraumatic.  Neck: Neck supple.  Cardiovascular: Normal rate and regular rhythm.  Pulmonary/Chest: Effort normal and breath sounds normal. He has no wheezes.  Abdominal: Soft. Bowel sounds are normal. There is no hepatosplenomegaly. There is no tenderness.  Musculoskeletal: He exhibits no edema.  Lymphadenopathy:    He has no cervical adenopathy.  Neurological: He is alert and oriented to person, place, and time.  Skin: Skin is warm and dry.  Psychiatric: He has a normal mood and affect. His behavior is normal.  Vitals reviewed.        Assessment & Plan:   Encounter Diagnoses  Name Primary?  . Essential hypertension Yes  . Uncontrolled type 2 diabetes mellitus with hyperglycemia ([place])   . Hyperlipidemia, unspecified hyperlipidemia type   . Hepatic abscess   . Screening for colon cancer   . Anemia, unspecified type   . Morbid obesity ([place])      -pt was Given iFOBT for colon cancer screening -pt counseled to Get his lisinopril from drugstore to help his blood pressure -pt is reminded when he gets [place] to d/c insulin and continue to monitor bs.  -pt to follow up 1 month.  RTO sooner prn

## 2017-08-12 ENCOUNTER — Telehealth: Payer: Self-pay | Admitting: *Deleted

## 2017-08-12 NOTE — Telephone Encounter (Signed)
Called patient and informed him. He knows to continue the oral antibiotic for two more weeks.

## 2017-08-15 ENCOUNTER — Ambulatory Visit: Payer: Self-pay | Admitting: Physician Assistant

## 2017-08-16 ENCOUNTER — Other Ambulatory Visit: Payer: Self-pay | Admitting: *Deleted

## 2017-08-16 DIAGNOSIS — K750 Abscess of liver: Secondary | ICD-10-CM

## 2017-08-16 MED ORDER — AMOXICILLIN-POT CLAVULANATE 500-125 MG PO TABS: 1.0000 | ORAL_TABLET | Freq: Three times a day (TID) | ORAL | 1 refills | Status: DC

## 2017-08-17 ENCOUNTER — Telehealth: Payer: Self-pay | Admitting: Orthopaedic Surgery

## 2017-08-17 MED ORDER — OXYCODONE-ACETAMINOPHEN 10-325 MG PO TABS: 1.0000 | ORAL_TABLET | ORAL | 0 refills | Status: DC | PRN

## 2017-08-17 NOTE — Telephone Encounter (Signed)
Call from patient for refill of: oxyCODONE-acetaminophen (PERCOCET) 10-325 MG tablet 150 tablet

## 2017-08-23 ENCOUNTER — Other Ambulatory Visit (HOSPITAL_COMMUNITY)
Admission: RE | Admit: 2017-08-23 | Discharge: 2017-08-23 | Disposition: A | Discharge status: Home / Self Care | Payer: Self-pay | Source: Ambulatory Visit | Attending: Physician Assistant | Admitting: Physician Assistant

## 2017-08-23 ENCOUNTER — Ambulatory Visit: Payer: Self-pay | Admitting: Physician Assistant

## 2017-08-23 ENCOUNTER — Encounter: Payer: Self-pay | Admitting: Physician Assistant

## 2017-08-23 VITALS — BP 146/78 | HR 89 | Ht 65.5 in | Wt 374.5 lb

## 2017-08-23 DIAGNOSIS — K750 Abscess of liver: Secondary | ICD-10-CM

## 2017-08-23 DIAGNOSIS — L03116 Cellulitis of left lower limb: Secondary | ICD-10-CM

## 2017-08-23 DIAGNOSIS — R21 Rash and other nonspecific skin eruption: Secondary | ICD-10-CM

## 2017-08-23 LAB — CBC WITH DIFFERENTIAL/PLATELET
BASOS ABS: 0 10*3/uL (ref 0.0–0.1)
BASOS PCT: 0 %
EOS PCT: 1 %
Eosinophils Absolute: 0.1 10*3/uL (ref 0.0–0.7)
HCT: 36.4 % — ABNORMAL LOW (ref 39.0–52.0)
Hemoglobin: 11 g/dL — ABNORMAL LOW (ref 13.0–17.0)
Lymphocytes Relative: 23 %
Lymphs Abs: 3 10*3/uL (ref 0.7–4.0)
MCH: 24.7 pg — ABNORMAL LOW (ref 26.0–34.0)
MCHC: 30.2 g/dL (ref 30.0–36.0)
MCV: 81.8 fL (ref 78.0–100.0)
MONO ABS: 1.2 10*3/uL — AB (ref 0.1–1.0)
Monocytes Relative: 9 %
Neutro Abs: 8.8 10*3/uL — ABNORMAL HIGH (ref 1.7–7.7)
Neutrophils Relative %: 67 %
PLATELETS: 384 10*3/uL (ref 150–400)
RBC: 4.45 MIL/uL (ref 4.22–5.81)
RDW: 15.9 % — AB (ref 11.5–15.5)
WBC: 13.1 10*3/uL — AB (ref 4.0–10.5)

## 2017-08-23 NOTE — Progress Notes (Signed)
BP (!) 146/78 (BP Location: Left Arm, Patient Position: Sitting, Cuff Size: Large)   Pulse 89   Ht 5' 5.5" (1.664 m)   Wt (!) 374 lb 8 oz (169.9 kg)   SpO2 98%   BMI 61.37 kg/m    Subjective:    Patient ID: [name], male    DOB: 02/09/1966, 51 y.o.   MRN: [id number]  HPI: [name] is a 51 y.o. male presenting on 08/23/2017 for Rash (on left leg)   HPI   Pt currently on augmentin for liver abscesses.  he is followed by ID for this.  He last saw dr Linus Salmons the end of November.  Pt states that the rash Started on Sunday.  He says it Started painful.  Then it turned to bumps.  It only itches a little bit.  It is still painful.  He has put nothing on it because he didn't know what to put on it. .  It is only on the L leg.   It is spreading.     Relevant past medical, surgical, family and social history reviewed and updated as indicated. Interim medical history since our last visit reviewed. Allergies and medications reviewed and updated.   Current Outpatient Medications:  .  amoxicillin-clavulanate (AUGMENTIN) 500-125 MG tablet, Take 1 tablet (500 mg total) by mouth 3 (three) times daily., Disp: 42 tablet, Rfl: 1 .  ferrous sulfate 325 (65 FE) MG EC tablet, Take 1 tablet (325 mg total) 2 (two) times daily by mouth., Disp: 60 tablet, Rfl: 0 .  insulin aspart protamine- aspart (NOVOLOG MIX 70/30) (70-30) 100 UNIT/ML injection, Inject 0.1 mLs (10 Units total) 2 (two) times daily with a meal into the skin., Disp: 10 mL, Rfl: 0 .  Insulin Syringe-Needle U-100 29G X 1/2" 0.5 ML MISC, Use as indicated, Disp: 100 each, Rfl: 0 .  metFORMIN (GLUCOPHAGE) 1000 MG tablet, Take 1 tablet (1,000 mg total) by mouth 2 (two) times daily with a meal., Disp: 180 tablet, Rfl: 1 .  oxyCODONE-acetaminophen (PERCOCET) 10-325 MG tablet, Take 1 tablet by mouth every 4 (four) hours as needed for pain., Disp: 150 tablet, Rfl: 0 .  lisinopril (PRINIVIL,ZESTRIL) 20 MG tablet, Take 1 tablet (20 mg total) by mouth  daily. (Patient not taking: Reported on 08/11/2017), Disp: 90 tablet, Rfl: 1 .  sitaGLIPtin (JANUVIA) 100 MG tablet, Take 1 tablet (100 mg total) by mouth daily. (Patient not taking: Reported on 08/11/2017), Disp: 90 tablet, Rfl: 1   Review of Systems  Constitutional: Negative for appetite change, chills, diaphoresis, fatigue, fever and unexpected weight change.  HENT: Negative for congestion, dental problem, drooling, ear pain, facial swelling, hearing loss, mouth sores, sneezing, sore throat, trouble swallowing and voice change.   Eyes: Negative for pain, discharge, redness, itching and visual disturbance.  Respiratory: Negative for cough, choking, shortness of breath and wheezing.   Cardiovascular: Positive for leg swelling. Negative for chest pain and palpitations.  Gastrointestinal: Negative for abdominal pain, blood in stool, constipation, diarrhea and vomiting.  Endocrine: Negative for cold intolerance, heat intolerance and polydipsia.  Genitourinary: Negative for decreased urine volume, dysuria and hematuria.  Musculoskeletal: Negative for arthralgias, back pain and gait problem.  Skin: Positive for rash.  Allergic/Immunologic: Negative for environmental allergies.  Neurological: Negative for seizures, syncope, light-headedness and headaches.  Hematological: Negative for adenopathy.  Psychiatric/Behavioral: Negative for agitation, dysphoric mood and suicidal ideas. The patient is not nervous/anxious.     Per HPI unless specifically indicated above  Objective:    BP (!) 146/78 (BP Location: Left Arm, Patient Position: Sitting, Cuff Size: Large)   Pulse 89   Ht 5' 5.5" (1.664 m)   Wt (!) 374 lb 8 oz (169.9 kg)   SpO2 98%   BMI 61.37 kg/m   Wt Readings from Last 3 Encounters:  08/23/17 (!) 374 lb 8 oz (169.9 kg)  08/11/17 (!) 361 lb (163.7 kg)  08/03/17 (!) 374 lb (169.6 kg)    Physical Exam  Constitutional: He is oriented to person, place, and time. He appears  well-developed and well-nourished.  HENT:  Head: Normocephalic and atraumatic.  Neck: Neck supple.  Pulmonary/Chest: Effort normal.  Musculoskeletal: He exhibits no edema.       Left lower leg: He exhibits no tenderness, no bony tenderness and no swelling.       Legs: Anterior aspect of LLE with red streaky rash.  There are about a dozen vesicles scattered.  The vesicles range in size from about 4 mm to just over 1 cm.  The redness is in wide swathes. There is no swelling.  No abscess. The area with redness (which is streaky, not confluent) is about 6 in x 8 inches.    Neurological: He is oriented to person, place, and time.  Skin: Skin is warm and dry.  Psychiatric: He has a normal mood and affect. His behavior is normal. Thought content normal.  Nursing note and vitals reviewed.        Assessment & Plan:    Encounter Diagnoses  Name Primary?  . Rash Yes  . Cellulitis of left lower extremity   . Hepatic abscess     Discussed with pt that it appears to be infectious.  He is already on antibiotics.  Will check cbcd today when he leaves office and will call with results and recommendations tomorrow.

## 2017-08-24 ENCOUNTER — Other Ambulatory Visit: Payer: Self-pay | Admitting: Physician Assistant

## 2017-08-24 MED ORDER — SULFAMETHOXAZOLE-TRIMETHOPRIM 800-160 MG PO TABS: 1.0000 | ORAL_TABLET | Freq: Two times a day (BID) | ORAL | 0 refills | Status: DC

## 2017-09-08 ENCOUNTER — Encounter: Payer: Self-pay | Admitting: Internal Medicine

## 2017-09-08 ENCOUNTER — Ambulatory Visit (INDEPENDENT_AMBULATORY_CARE_PROVIDER_SITE_OTHER): Payer: Self-pay | Admitting: Internal Medicine

## 2017-09-08 VITALS — BP 111/76 | HR 105 | Temp 98.0°F | Ht 68.0 in | Wt 368.0 lb

## 2017-09-08 DIAGNOSIS — Z5181 Encounter for therapeutic drug level monitoring: Secondary | ICD-10-CM

## 2017-09-08 DIAGNOSIS — Z95828 Presence of other vascular implants and grafts: Secondary | ICD-10-CM

## 2017-09-08 DIAGNOSIS — K750 Abscess of liver: Secondary | ICD-10-CM

## 2017-09-08 NOTE — Assessment & Plan Note (Signed)
Will check cmp on Bactrim.

## 2017-09-08 NOTE — Progress Notes (Signed)
   Subjective:    Patient ID: [name], male    DOB: May 26, 1966, 52 y.o.   MRN: [id number]  HPI Here for follow up of hepatic abscess. Had abscess with drainage and bacteremia with Strep group F and gram stain with GPC, Candida and sent out on amp sulbactam and fluconazole and completed about 1 month. I transitioned him to Augmentin and he took about 4 weeks.  No fever, no chills. He received bactrim from his PCP for leg folliculitis and stopped the Augmentin due to intolerance of both.     Review of Systems  Constitutional: Negative for chills and fever.  Gastrointestinal: Negative for diarrhea and nausea.  Skin: Negative for rash.       Objective:   Physical Exam  Constitutional: He appears well-developed and well-nourished. No distress.  HENT:  Mouth/Throat: No oropharyngeal exudate.  Eyes: No scleral icterus.  Cardiovascular: Normal rate, regular rhythm and normal heart sounds.  No murmur heard. Pulmonary/Chest: Effort normal and breath sounds normal. No respiratory distress.  Skin: No rash noted.  Left leg with healed scars, no active infection   SH: remote smoking history       Assessment & Plan:

## 2017-09-08 NOTE — Assessment & Plan Note (Signed)
Now removed 

## 2017-09-08 NOTE — Assessment & Plan Note (Signed)
Asymptomatic now so will stop antibiotics and observe.  I will check crp, esr, wbc today.  He will return prn for any concerns.

## 2017-09-09 ENCOUNTER — Telehealth: Payer: Self-pay

## 2017-09-09 LAB — CBC WITH DIFFERENTIAL/PLATELET
BASOS ABS: 56 {cells}/uL (ref 0–200)
BASOS PCT: 0.4 %
EOS ABS: 98 {cells}/uL (ref 15–500)
Eosinophils Relative: 0.7 %
HEMATOCRIT: 38.9 % (ref 38.5–50.0)
HEMOGLOBIN: 12.4 g/dL — AB (ref 13.2–17.1)
LYMPHS ABS: 3080 {cells}/uL (ref 850–3900)
MCH: 24.8 pg — ABNORMAL LOW (ref 27.0–33.0)
MCHC: 31.9 g/dL — AB (ref 32.0–36.0)
MCV: 77.6 fL — AB (ref 80.0–100.0)
MONOS PCT: 8.2 %
MPV: 10.6 fL (ref 7.5–12.5)
Neutro Abs: 9618 cells/uL — ABNORMAL HIGH (ref 1500–7800)
Neutrophils Relative %: 68.7 %
Platelets: 449 10*3/uL — ABNORMAL HIGH (ref 140–400)
RBC: 5.01 10*6/uL (ref 4.20–5.80)
RDW: 14.9 % (ref 11.0–15.0)
Total Lymphocyte: 22 %
WBC: 14 10*3/uL — ABNORMAL HIGH (ref 3.8–10.8)
WBCMIX: 1148 {cells}/uL — AB (ref 200–950)

## 2017-09-09 LAB — COMPREHENSIVE METABOLIC PANEL
AG RATIO: 0.9 (calc) — AB (ref 1.0–2.5)
ALBUMIN MSPROF: 3.4 g/dL — AB (ref 3.6–5.1)
ALKALINE PHOSPHATASE (APISO): 133 U/L — AB (ref 40–115)
ALT: 19 U/L (ref 9–46)
AST: 12 U/L (ref 10–35)
BUN/Creatinine Ratio: 13 (calc) (ref 6–22)
BUN: 18 mg/dL (ref 7–25)
CHLORIDE: 103 mmol/L (ref 98–110)
CO2: 23 mmol/L (ref 20–32)
CREATININE: 1.42 mg/dL — AB (ref 0.70–1.33)
Calcium: 9 mg/dL (ref 8.6–10.3)
GLOBULIN: 4 g/dL — AB (ref 1.9–3.7)
Glucose, Bld: 161 mg/dL — ABNORMAL HIGH (ref 65–99)
POTASSIUM: 4.2 mmol/L (ref 3.5–5.3)
Sodium: 136 mmol/L (ref 135–146)
Total Bilirubin: 0.4 mg/dL (ref 0.2–1.2)
Total Protein: 7.4 g/dL (ref 6.1–8.1)

## 2017-09-09 LAB — SEDIMENTATION RATE: SED RATE: 92 mm/h — AB (ref 0–20)

## 2017-09-09 LAB — C-REACTIVE PROTEIN: CRP: 112.2 mg/L — ABNORMAL HIGH (ref <8.0)

## 2017-09-09 NOTE — Telephone Encounter (Signed)
-----   Message from Thayer Headings, MD sent at 09/09/2017  2:14 PM EST ----- On his labs yesterday, his creat/kidney test is a bit elevated.  I suspect it is from the antibiotic (Bactrim) he is taking so I would like him to just go ahead and stop it now rather than finish what he has.     I would like to repeat the test in a week or two either by coming back to see me or if he wants to contact his PCP.  thanks

## 2017-09-09 NOTE — Telephone Encounter (Signed)
Patient informed to stop Bactrim and return for CMP in one week.  Appointment scheduled for lab only on 09-16-17.

## 2017-09-12 ENCOUNTER — Ambulatory Visit: Payer: Self-pay | Admitting: Physician Assistant

## 2017-09-12 ENCOUNTER — Encounter: Payer: Self-pay | Admitting: Physician Assistant

## 2017-09-12 VITALS — BP 144/86 | HR 89 | Temp 97.7°F | Ht 65.5 in | Wt 367.5 lb

## 2017-09-12 DIAGNOSIS — E1165 Type 2 diabetes mellitus with hyperglycemia: Secondary | ICD-10-CM

## 2017-09-12 DIAGNOSIS — E785 Hyperlipidemia, unspecified: Secondary | ICD-10-CM

## 2017-09-12 DIAGNOSIS — I10 Essential (primary) hypertension: Secondary | ICD-10-CM

## 2017-09-12 DIAGNOSIS — D649 Anemia, unspecified: Secondary | ICD-10-CM

## 2017-09-12 DIAGNOSIS — R7989 Other specified abnormal findings of blood chemistry: Secondary | ICD-10-CM

## 2017-09-12 DIAGNOSIS — E119 Type 2 diabetes mellitus without complications: Secondary | ICD-10-CM

## 2017-09-12 DIAGNOSIS — Z6841 Body Mass Index (BMI) 40.0 and over, adult: Secondary | ICD-10-CM

## 2017-09-12 MED ORDER — METOPROLOL TARTRATE 25 MG PO TABS: 25.0000 mg | ORAL_TABLET | Freq: Two times a day (BID) | ORAL | 1 refills | Status: DC

## 2017-09-12 NOTE — Progress Notes (Signed)
BP (!) 144/86 (BP Location: Left Arm, Patient Position: Sitting, Cuff Size: Large)   Pulse 89   Temp 97.7 F (36.5 C) (Other (Comment))   Ht 5' 5.5" (1.664 m)   Wt (!) 367 lb 8 oz (166.7 kg)   SpO2 98%   BMI 60.23 kg/m    Subjective:    Patient ID: [name], male    DOB: 1966-06-07, 52 y.o.   MRN: [id number]  HPI: [name] is a 52 y.o. male presenting on 09/12/2017 for Diabetes and Hypertension   HPI  Pt states leg is doing better.  He saw ID doctor and he took him off all his antibiotics.  This am bs 146 after breakfast.   He is not exercising regularly, but does occasionally.    Relevant past medical, surgical, family and social history reviewed and updated as indicated. Interim medical history since our last visit reviewed. Allergies and medications reviewed and updated.   Current Outpatient Medications:  .  lisinopril (PRINIVIL,ZESTRIL) 20 MG tablet, Take 1 tablet (20 mg total) by mouth daily., Disp: 90 tablet, Rfl: 1 .  metFORMIN (GLUCOPHAGE) 1000 MG tablet, Take 1 tablet (1,000 mg total) by mouth 2 (two) times daily with a meal., Disp: 180 tablet, Rfl: 1 .  oxyCODONE-acetaminophen (PERCOCET) 10-325 MG tablet, Take 1 tablet by mouth every 4 (four) hours as needed for pain., Disp: 150 tablet, Rfl: 0 .  sitaGLIPtin (JANUVIA) 100 MG tablet, Take 1 tablet (100 mg total) by mouth daily., Disp: 90 tablet, Rfl: 1 .  ferrous sulfate 325 (65 FE) MG EC tablet, Take 1 tablet (325 mg total) 2 (two) times daily by mouth., Disp: 60 tablet, Rfl: 0   Review of Systems  Constitutional: Negative for appetite change, chills, diaphoresis, fatigue, fever and unexpected weight change.  HENT: Negative for congestion, dental problem, drooling, ear pain, facial swelling, hearing loss, mouth sores, sneezing, sore throat, trouble swallowing and voice change.   Eyes: Negative for pain, discharge, redness, itching and visual disturbance.  Respiratory: Negative for cough, choking, shortness of  breath and wheezing.   Cardiovascular: Negative for chest pain, palpitations and leg swelling.  Gastrointestinal: Negative for abdominal pain, blood in stool, constipation, diarrhea and vomiting.  Endocrine: Negative for cold intolerance, heat intolerance and polydipsia.  Genitourinary: Negative for decreased urine volume, dysuria and hematuria.  Musculoskeletal: Negative for arthralgias, back pain and gait problem.  Skin: Negative for rash.  Allergic/Immunologic: Negative for environmental allergies.  Neurological: Negative for seizures, syncope, light-headedness and headaches.  Hematological: Negative for adenopathy.  Psychiatric/Behavioral: Negative for agitation, dysphoric mood and suicidal ideas. The patient is not nervous/anxious.     Per HPI unless specifically indicated above     Objective:    BP (!) 144/86 (BP Location: Left Arm, Patient Position: Sitting, Cuff Size: Large)   Pulse 89   Temp 97.7 F (36.5 C) (Other (Comment))   Ht 5' 5.5" (1.664 m)   Wt (!) 367 lb 8 oz (166.7 kg)   SpO2 98%   BMI 60.23 kg/m   Wt Readings from Last 3 Encounters:  09/12/17 (!) 367 lb 8 oz (166.7 kg)  09/08/17 (!) 368 lb (166.9 kg)  08/23/17 (!) 374 lb 8 oz (169.9 kg)    Physical Exam  Constitutional: He is oriented to person, place, and time. He appears well-developed and well-nourished.  HENT:  Head: Normocephalic and atraumatic.  Neck: Neck supple.  Cardiovascular: Normal rate and regular rhythm.  Pulmonary/Chest: Effort normal and breath sounds  normal. He has no wheezes.  Abdominal: Soft. Bowel sounds are normal. There is no hepatosplenomegaly. There is no tenderness.  Musculoskeletal: He exhibits no edema.  Lymphadenopathy:    He has no cervical adenopathy.  Neurological: He is alert and oriented to person, place, and time.  Skin: Skin is warm and dry.  LLE improved.   Psychiatric: He has a normal mood and affect. His behavior is normal.  Vitals reviewed.   Results for  orders placed or performed in visit on 09/08/17  CBC with Differential/Platelet  Result Value Ref Range   WBC 14.0 (H) 3.8 - 10.8 Thousand/uL   RBC 5.01 4.20 - 5.80 Million/uL   Hemoglobin 12.4 (L) 13.2 - 17.1 g/dL   HCT 38.9 38.5 - 50.0 %   MCV 77.6 (L) 80.0 - 100.0 fL   MCH 24.8 (L) 27.0 - 33.0 pg   MCHC 31.9 (L) 32.0 - 36.0 g/dL   RDW 14.9 11.0 - 15.0 %   Platelets 449 (H) 140 - 400 Thousand/uL   MPV 10.6 7.5 - 12.5 fL   Neutro Abs 9,618 (H) 1,500 - 7,800 cells/uL   Lymphs Abs 3,080 850 - 3,900 cells/uL   WBC mixed population 1,148 (H) 200 - 950 cells/uL   Eosinophils Absolute 98 15 - 500 cells/uL   Basophils Absolute 56 0 - 200 cells/uL   Neutrophils Relative % 68.7 %   Total Lymphocyte 22.0 %   Monocytes Relative 8.2 %   Eosinophils Relative 0.7 %   Basophils Relative 0.4 %  C-reactive protein  Result Value Ref Range   CRP 112.2 (H) <8.0 mg/L  Sedimentation rate  Result Value Ref Range   Sed Rate 92 (H) 0 - 20 mm/h  Comp Met (CMET)  Result Value Ref Range   Glucose, Bld 161 (H) 65 - 99 mg/dL   BUN 18 7 - 25 mg/dL   Creat 1.42 (H) 0.70 - 1.33 mg/dL   BUN/Creatinine Ratio 13 6 - 22 (calc)   Sodium 136 135 - 146 mmol/L   Potassium 4.2 3.5 - 5.3 mmol/L   Chloride 103 98 - 110 mmol/L   CO2 23 20 - 32 mmol/L   Calcium 9.0 8.6 - 10.3 mg/dL   Total Protein 7.4 6.1 - 8.1 g/dL   Albumin 3.4 (L) 3.6 - 5.1 g/dL   Globulin 4.0 (H) 1.9 - 3.7 g/dL (calc)   AG Ratio 0.9 (L) 1.0 - 2.5 (calc)   Total Bilirubin 0.4 0.2 - 1.2 mg/dL   Alkaline phosphatase (APISO) 133 (H) 40 - 115 U/L   AST 12 10 - 35 U/L   ALT 19 9 - 46 U/L      Assessment & Plan:   Encounter Diagnoses  Name Primary?  . Essential hypertension Yes  . Type 2 diabetes mellitus without complication, without long-term current use of insulin ([place])   . Creatinine elevation   . Morbid obesity ([place])   . Hyperlipidemia, unspecified hyperlipidemia type   . Anemia, unspecified type   . BMI 60.0-69.9, adult ([place])   .  Uncontrolled type 2 diabetes mellitus with hyperglycemia ([facility])     -Check cmp this Friday. Will forward results to dr Linus Salmons (ID) -will refer for DM eye exam -discussed with pt that fasting bs before breakfast is more helpful than bs after his breakfast.   He will monitor his fbs -encouraged pt to exercise regularly/walk often to help his DM and his weight -pt to follow up 1 month.  RTO sooner prn

## 2017-09-15 ENCOUNTER — Encounter: Payer: Self-pay | Admitting: Orthopaedic Surgery

## 2017-09-15 ENCOUNTER — Ambulatory Visit: Payer: Self-pay | Admitting: Orthopaedic Surgery

## 2017-09-15 VITALS — BP 162/102 | HR 94 | Ht 65.5 in | Wt 368.0 lb

## 2017-09-15 DIAGNOSIS — M25562 Pain in left knee: Secondary | ICD-10-CM

## 2017-09-15 DIAGNOSIS — G8929 Other chronic pain: Secondary | ICD-10-CM

## 2017-09-15 DIAGNOSIS — M25561 Pain in right knee: Secondary | ICD-10-CM

## 2017-09-15 MED ORDER — OXYCODONE-ACETAMINOPHEN 10-325 MG PO TABS: 1.0000 | ORAL_TABLET | ORAL | 0 refills | Status: DC | PRN

## 2017-09-15 NOTE — Progress Notes (Signed)
Patient [name], male DOB:Aug 12, 1966, 52 y.o. KVQ:[id number]  Chief Complaint  Patient presents with  . Follow-up    bilateral knee pain    HPI  [name] is a 52 y.o. male who has bilateral knee pain.  He has more pain recently but no giving way.  The left knee is more painful.  He has swelling and popping but no locking or redness.  He is taking his medicine. HPI  Body mass index is 60.31 kg/m.  ROS  Review of Systems  HENT: Negative for congestion.   Respiratory: Positive for shortness of breath. Negative for cough.   Cardiovascular: Positive for leg swelling. Negative for chest pain.  Endocrine: Negative for cold intolerance.  Musculoskeletal: Positive for arthralgias, back pain, gait problem, joint swelling and myalgias.  Allergic/Immunologic: Positive for environmental allergies.  Neurological: Negative for numbness.  All other systems reviewed and are negative.   Past Medical History:  Diagnosis Date  . Arthritis   . Diabetes mellitus without complication ([place])    diagnosed about age 71  . Gout   . Hypertension    boarderline    Past Surgical History:  Procedure Laterality Date  . HERNIA REPAIR  2008  . MASS EXCISION Left age 39   L thigh- benign    Family History  Problem Relation Age of Onset  . Cancer Mother   . Diabetes Mother   . Heart failure Father   . Hypertension Father   . Diabetes Father     Social History Social History   Tobacco Use  . Smoking status: Former Smoker    Packs/day: 0.50    Years: 5.00    Pack years: 2.50    Types: Cigarettes    Last attempt to quit: 1990    Years since quitting: 29.0  . Smokeless tobacco: Never Used  Substance Use Topics  . Alcohol use: No  . Drug use: No    No Known Allergies  Current Outpatient Medications  Medication Sig Dispense Refill  . ferrous sulfate 325 (65 FE) MG EC tablet Take 1 tablet (325 mg total) 2 (two) times daily by mouth. 60 tablet 0  . lisinopril  (PRINIVIL,ZESTRIL) 20 MG tablet Take 1 tablet (20 mg total) by mouth daily. 90 tablet 1  . metFORMIN (GLUCOPHAGE) 1000 MG tablet Take 1 tablet (1,000 mg total) by mouth 2 (two) times daily with a meal. 180 tablet 1  . metoprolol tartrate (LOPRESSOR) 25 MG tablet Take 1 tablet (25 mg total) by mouth 2 (two) times daily. 60 tablet 1  . oxyCODONE-acetaminophen (PERCOCET) 10-325 MG tablet Take 1 tablet by mouth every 4 (four) hours as needed for pain. 150 tablet 0  . sitaGLIPtin (JANUVIA) 100 MG tablet Take 1 tablet (100 mg total) by mouth daily. 90 tablet 1   No current facility-administered medications for this visit.      Physical Exam  Blood pressure (!) 162/102, pulse 94, height 5' 5.5" (1.664 m), weight (!) 368 lb (166.9 kg).  Constitutional: overall normal hygiene, normal nutrition, well developed, normal grooming, normal body habitus. Assistive device:none  Musculoskeletal: gait and station Limp left, muscle tone and strength are normal, no tremors or atrophy is present.  .  Neurological: coordination overall normal.  Deep tendon reflex/nerve stretch intact.  Sensation normal.  Cranial nerves II-XII intact.   Skin:   Normal overall no scars, lesions, ulcers or rashes. No psoriasis.  Psychiatric: Alert and oriented x 3.  Recent memory intact, remote memory unclear.  Normal mood and affect. Well groomed.  Good eye contact.  Cardiovascular: overall no swelling, no varicosities, no edema bilaterally, normal temperatures of the legs and arms, no clubbing, cyanosis and good capillary refill.  Lymphatic: palpation is normal.  The bilateral lower extremity is examined:  Inspection:  Thigh:  Non-tender and no defects  Knee has swelling 1+ effusion.                        Joint tenderness is present                        Patient is tender over the medial joint line  Lower Leg:  Has normal appearance and no tenderness or defects  Ankle:  Non-tender and no defects  Foot:  Non-tender  and no defects Range of Motion:  Knee:  Range of motion is: 0-110 right, 0-105 left                        Crepitus is  present  Ankle:  Range of motion is normal. Strength and Tone:  The bilateral lower extremity has normal strength and tone. Stability:  Knee:  The knee is stable.  Ankle:  The ankle is stable.   All other systems reviewed and are negative   The patient has been educated about the nature of the problem(s) and counseled on treatment options.  The patient appeared to understand what I have discussed and is in agreement with it.  Encounter Diagnoses  Name Primary?  . Chronic pain of right knee Yes  . Chronic pain of left knee   . Morbid obesity due to excess calories [facility])     PLAN Call if any problems.  Precautions discussed.  Continue current medications.   Return to clinic 4 months   I have reviewed the [place] web site prior to prescribing narcotic medicine for this patient.  Electronically Signed Sanjuana Kava, MD 1/10/20192:18 PM

## 2017-09-16 ENCOUNTER — Other Ambulatory Visit: Payer: Self-pay

## 2017-09-16 ENCOUNTER — Other Ambulatory Visit (HOSPITAL_COMMUNITY)
Admission: RE | Admit: 2017-09-16 | Discharge: 2017-09-16 | Disposition: A | Discharge status: Home / Self Care | Payer: Self-pay | Source: Ambulatory Visit | Attending: Physician Assistant | Admitting: Physician Assistant

## 2017-09-16 DIAGNOSIS — R69 Illness, unspecified: Secondary | ICD-10-CM | POA: Insufficient documentation

## 2017-09-16 LAB — COMPREHENSIVE METABOLIC PANEL
ALBUMIN: 3.7 g/dL (ref 3.5–5.0)
ALT: 20 U/L (ref 17–63)
AST: 22 U/L (ref 15–41)
Alkaline Phosphatase: 122 U/L (ref 38–126)
Anion gap: 10 (ref 5–15)
BUN: 13 mg/dL (ref 6–20)
CHLORIDE: 103 mmol/L (ref 101–111)
CO2: 26 mmol/L (ref 22–32)
Calcium: 9.4 mg/dL (ref 8.9–10.3)
Creatinine, Ser: 0.86 mg/dL (ref 0.61–1.24)
GFR calc Af Amer: >60 mL/min (ref >60)
GFR calc non Af Amer: >60 mL/min (ref >60)
Glucose, Bld: 133 mg/dL — ABNORMAL HIGH (ref 65–99)
POTASSIUM: 4.1 mmol/L (ref 3.5–5.1)
SODIUM: 139 mmol/L (ref 135–145)
Total Bilirubin: 0.4 mg/dL (ref 0.3–1.2)
Total Protein: 9 g/dL — ABNORMAL HIGH (ref 6.5–8.1)

## 2017-10-10 ENCOUNTER — Ambulatory Visit: Payer: Self-pay | Admitting: Physician Assistant

## 2017-10-11 ENCOUNTER — Encounter: Payer: Self-pay | Admitting: Physician Assistant

## 2017-10-11 ENCOUNTER — Ambulatory Visit: Payer: Self-pay | Admitting: Physician Assistant

## 2017-10-11 VITALS — BP 162/84 | HR 87 | Temp 97.7°F | Ht 65.5 in | Wt 368.0 lb

## 2017-10-11 DIAGNOSIS — E119 Type 2 diabetes mellitus without complications: Secondary | ICD-10-CM

## 2017-10-11 DIAGNOSIS — J00 Acute nasopharyngitis [common cold]: Secondary | ICD-10-CM

## 2017-10-11 DIAGNOSIS — I10 Essential (primary) hypertension: Secondary | ICD-10-CM

## 2017-10-11 DIAGNOSIS — Z6841 Body Mass Index (BMI) 40.0 and over, adult: Secondary | ICD-10-CM

## 2017-10-11 DIAGNOSIS — K0889 Other specified disorders of teeth and supporting structures: Secondary | ICD-10-CM

## 2017-10-11 DIAGNOSIS — E785 Hyperlipidemia, unspecified: Secondary | ICD-10-CM

## 2017-10-11 MED ORDER — METOPROLOL TARTRATE 50 MG PO TABS: 50.0000 mg | ORAL_TABLET | Freq: Two times a day (BID) | ORAL | 1 refills | Status: DC

## 2017-10-11 MED ORDER — AMOXICILLIN 500 MG PO CAPS: 500.0000 mg | ORAL_CAPSULE | Freq: Three times a day (TID) | ORAL | 0 refills | Status: DC

## 2017-10-11 NOTE — Progress Notes (Signed)
BP (!) 162/84 (BP Location: Left Arm, Patient Position: Sitting, Cuff Size: Large)   Pulse 87   Temp 97.7 F (36.5 C) (Other (Comment))   Ht 5' 5.5" (1.664 m)   Wt (!) 368 lb (166.9 kg)   SpO2 97%   BMI 60.31 kg/m    Subjective:    Patient ID: [name], male    DOB: [birth year], 52 y.o.   MRN: [id number]  HPI: [name] is a 52 y.o. male presenting on 10/11/2017 for Hypertension   HPI   Back tooth hurting several weeks  Cold symptoms 2 or 3 days.  Relevant past medical, surgical, family and social history reviewed and updated as indicated. Interim medical history since our last visit reviewed. Allergies and medications reviewed and updated.   Current Outpatient Medications:  .  ferrous sulfate 325 (65 FE) MG EC tablet, Take 1 tablet (325 mg total) 2 (two) times daily by mouth., Disp: 60 tablet, Rfl: 0 .  lisinopril (PRINIVIL,ZESTRIL) 20 MG tablet, Take 1 tablet (20 mg total) by mouth daily., Disp: 90 tablet, Rfl: 1 .  metFORMIN (GLUCOPHAGE) 1000 MG tablet, Take 1 tablet (1,000 mg total) by mouth 2 (two) times daily with a meal., Disp: 180 tablet, Rfl: 1 .  metoprolol tartrate (LOPRESSOR) 25 MG tablet, Take 1 tablet (25 mg total) by mouth 2 (two) times daily., Disp: 60 tablet, Rfl: 1 .  oxyCODONE-acetaminophen (PERCOCET) 10-325 MG tablet, Take 1 tablet by mouth every 4 (four) hours as needed for pain., Disp: 150 tablet, Rfl: 0 .  sitaGLIPtin (JANUVIA) 100 MG tablet, Take 1 tablet (100 mg total) by mouth daily., Disp: 90 tablet, Rfl: 1   Review of Systems  Constitutional: Negative for appetite change, chills, diaphoresis, fatigue, fever and unexpected weight change.  HENT: Positive for congestion and dental problem. Negative for drooling, ear pain, facial swelling, hearing loss, mouth sores, sneezing, sore throat, trouble swallowing and voice change.   Eyes: Negative for pain, discharge, redness, itching and visual disturbance.  Respiratory: Positive for cough and wheezing.  Negative for choking and shortness of breath.   Cardiovascular: Negative for chest pain, palpitations and leg swelling.  Gastrointestinal: Negative for abdominal pain, blood in stool, constipation, diarrhea and vomiting.  Endocrine: Negative for cold intolerance, heat intolerance and polydipsia.  Genitourinary: Negative for decreased urine volume, dysuria and hematuria.  Musculoskeletal: Negative for arthralgias, back pain and gait problem.  Skin: Negative for rash.  Allergic/Immunologic: Negative for environmental allergies.  Neurological: Negative for seizures, syncope, light-headedness and headaches.  Hematological: Negative for adenopathy.  Psychiatric/Behavioral: Negative for agitation, dysphoric mood and suicidal ideas. The patient is not nervous/anxious.     Per HPI unless specifically indicated above     Objective:    BP (!) 162/84 (BP Location: Left Arm, Patient Position: Sitting, Cuff Size: Large)   Pulse 87   Temp 97.7 F (36.5 C) (Other (Comment))   Ht 5' 5.5" (1.664 m)   Wt (!) 368 lb (166.9 kg)   SpO2 97%   BMI 60.31 kg/m   Wt Readings from Last 3 Encounters:  10/11/17 (!) 368 lb (166.9 kg)  09/15/17 (!) 368 lb (166.9 kg)  09/12/17 (!) 367 lb 8 oz (166.7 kg)    Physical Exam  Constitutional: He is oriented to person, place, and time. He appears well-developed and well-nourished.  HENT:  Head: Normocephalic and atraumatic.  Mouth/Throat: No trismus in the jaw. Abnormal dentition. Dental caries present. No dental abscesses.    Neck: Neck  supple.  Cardiovascular: Normal rate and regular rhythm.  Pulmonary/Chest: Effort normal and breath sounds normal. He has no wheezes.  Abdominal: Soft. Bowel sounds are normal. There is no hepatosplenomegaly. There is no tenderness.  Musculoskeletal: He exhibits no edema.  Lymphadenopathy:    He has no cervical adenopathy.  Neurological: He is alert and oriented to person, place, and time.  Skin: Skin is warm and dry.    Psychiatric: He has a normal mood and affect. His behavior is normal.  Vitals reviewed.       Assessment & Plan:   Encounter Diagnoses  Name Primary?  . Essential hypertension Yes  . Type 2 diabetes mellitus without complication, without long-term current use of insulin ([place])   . Dentalgia   . Acute nasopharyngitis   . Morbid obesity ([place])   . Hyperlipidemia, unspecified hyperlipidemia type   . BMI 60.0-69.9, adult ([place])     -rx amoxil for tooth and dental list -Increase metoprolol for htn -pt to Get labs- fasting- this week -pt given phone number for financial counselor to check on his charity care application -pt to follow up 1 month for  bp and labs review

## 2017-10-11 NOTE — Patient Instructions (Signed)
Financial counselor- [phone]   

## 2017-10-13 ENCOUNTER — Ambulatory Visit: Payer: Self-pay | Admitting: Orthopaedic Surgery

## 2017-10-13 ENCOUNTER — Encounter: Payer: Self-pay | Admitting: Orthopaedic Surgery

## 2017-10-13 VITALS — BP 148/80 | HR 99 | Temp 97.7°F | Ht 65.5 in | Wt 366.0 lb

## 2017-10-13 DIAGNOSIS — M25561 Pain in right knee: Secondary | ICD-10-CM

## 2017-10-13 DIAGNOSIS — G8929 Other chronic pain: Secondary | ICD-10-CM

## 2017-10-13 DIAGNOSIS — M25562 Pain in left knee: Secondary | ICD-10-CM

## 2017-10-13 MED ORDER — OXYCODONE-ACETAMINOPHEN 10-325 MG PO TABS: 1.0000 | ORAL_TABLET | ORAL | 0 refills | Status: DC | PRN

## 2017-10-13 NOTE — Progress Notes (Signed)
Patient [name], male DOB:[birth year], 52 y.o. LZJ:[id number]  Chief Complaint  Patient presents with  . Knee Pain    bilateral     HPI  [name] is a 52 y.o. male who has chronic pain of the knees bilaterally.  He has no new trauma.  He had more pain with the cold weather.  He has pain after prolonged standing.  He is active and takin his medicine. HPI  Body mass index is 59.98 kg/m.  ROS  Review of Systems  HENT: Negative for congestion.   Respiratory: Positive for shortness of breath. Negative for cough.   Cardiovascular: Positive for leg swelling. Negative for chest pain.  Endocrine: Negative for cold intolerance.  Musculoskeletal: Positive for arthralgias, back pain, gait problem, joint swelling and myalgias.  Allergic/Immunologic: Positive for environmental allergies.  Neurological: Negative for numbness.  All other systems reviewed and are negative.   Past Medical History:  Diagnosis Date  . Arthritis   . Diabetes mellitus without complication ([place])    diagnosed about age 39  . Gout   . Hypertension    boarderline    Past Surgical History:  Procedure Laterality Date  . HERNIA REPAIR  2008  . MASS EXCISION Left age 63   L thigh- benign    Family History  Problem Relation Age of Onset  . Cancer Mother   . Diabetes Mother   . Heart failure Father   . Hypertension Father   . Diabetes Father     Social History Social History   Tobacco Use  . Smoking status: Former Smoker    Packs/day: 0.50    Years: 5.00    Pack years: 2.50    Types: Cigarettes    Last attempt to quit: 1990    Years since quitting: 29.1  . Smokeless tobacco: Never Used  Substance Use Topics  . Alcohol use: No  . Drug use: No    No Known Allergies  Current Outpatient Medications  Medication Sig Dispense Refill  . amoxicillin (AMOXIL) 500 MG capsule Take 1 capsule (500 mg total) by mouth 3 (three) times daily. 21 capsule 0  . lisinopril (PRINIVIL,ZESTRIL) 20 MG  tablet Take 1 tablet (20 mg total) by mouth daily. 90 tablet 1  . metFORMIN (GLUCOPHAGE) 1000 MG tablet Take 1 tablet (1,000 mg total) by mouth 2 (two) times daily with a meal. 180 tablet 1  . metoprolol tartrate (LOPRESSOR) 50 MG tablet Take 1 tablet (50 mg total) by mouth 2 (two) times daily. 60 tablet 1  . oxyCODONE-acetaminophen (PERCOCET) 10-325 MG tablet Take 1 tablet by mouth every 4 (four) hours as needed for pain. 150 tablet 0  . sitaGLIPtin (JANUVIA) 100 MG tablet Take 1 tablet (100 mg total) by mouth daily. 90 tablet 1  . ferrous sulfate 325 (65 FE) MG EC tablet Take 1 tablet (325 mg total) 2 (two) times daily by mouth. 60 tablet 0   No current facility-administered medications for this visit.      Physical Exam  Blood pressure (!) 148/80, pulse 99, temperature 97.7 F (36.5 C), height 5' 5.5" (1.664 m), weight (!) 366 lb (166 kg).  Constitutional: overall normal hygiene, normal nutrition, well developed, normal grooming, normal body habitus. Assistive device:none  Musculoskeletal: gait and station Limp left, muscle tone and strength are normal, no tremors or atrophy is present.  .  Neurological: coordination overall normal.  Deep tendon reflex/nerve stretch intact.  Sensation normal.  Cranial nerves II-XII intact.   Skin:  Normal overall no scars, lesions, ulcers or rashes. No psoriasis.  Psychiatric: Alert and oriented x 3.  Recent memory intact, remote memory unclear.  Normal mood and affect. Well groomed.  Good eye contact.  Cardiovascular: overall no swelling, no varicosities, no edema bilaterally, normal temperatures of the legs and arms, no clubbing, cyanosis and good capillary refill.  Lymphatic: palpation is normal.  The bilateral lower extremity is examined:  Inspection:  Thigh:  Non-tender and no defects  Knee has swelling 1+ effusion.                        Joint tenderness is present                        Patient is tender over the medial joint  line  Lower Leg:  Has normal appearance and no tenderness or defects  Ankle:  Non-tender and no defects  Foot:  Non-tender and no defects Range of Motion:  Knee:  Range of motion is: 0-100 left, 0 - 105 right                        Crepitus is  present  Ankle:  Range of motion is normal. Strength and Tone:  The bilateral lower extremity has normal strength and tone. Stability:  Knee:  The knee is stable.  Ankle:  The ankle is stable.   All other systems reviewed and are negative   The patient has been educated about the nature of the problem(s) and counseled on treatment options.  The patient appeared to understand what I have discussed and is in agreement with it.  Encounter Diagnoses  Name Primary?  . Chronic pain of right knee Yes  . Chronic pain of left knee   . Morbid obesity due to excess calories [facility])     PLAN Call if any problems.  Precautions discussed.  Continue current medications.   Return to clinic 3 months   I have reviewed the [place] web site prior to prescribing narcotic medicine for this patient.  Electronically Signed Sanjuana Kava, MD 2/7/20192:01 PM

## 2017-11-03 ENCOUNTER — Telehealth: Payer: Self-pay | Admitting: Orthopaedic Surgery

## 2017-11-03 NOTE — Telephone Encounter (Signed)
Oxycodone-Acetaminophen 10-325 mg  Qty  150 Tablets  Take 1 tablet by mouth every 4 (four) hours as needed for pain.   PATIENT USES [facility]

## 2017-11-04 NOTE — Telephone Encounter (Signed)
Rx will be filled Monday he called in after 12 , too late

## 2017-11-08 ENCOUNTER — Ambulatory Visit: Payer: Self-pay | Admitting: Physician Assistant

## 2017-11-08 ENCOUNTER — Encounter: Payer: Self-pay | Admitting: Physician Assistant

## 2017-11-08 VITALS — BP 130/80 | HR 90 | Temp 98.2°F | Ht 65.5 in | Wt 375.0 lb

## 2017-11-08 DIAGNOSIS — D649 Anemia, unspecified: Secondary | ICD-10-CM

## 2017-11-08 DIAGNOSIS — H6123 Impacted cerumen, bilateral: Secondary | ICD-10-CM

## 2017-11-08 DIAGNOSIS — E119 Type 2 diabetes mellitus without complications: Secondary | ICD-10-CM

## 2017-11-08 DIAGNOSIS — J00 Acute nasopharyngitis [common cold]: Secondary | ICD-10-CM

## 2017-11-08 DIAGNOSIS — E785 Hyperlipidemia, unspecified: Secondary | ICD-10-CM

## 2017-11-08 DIAGNOSIS — Z125 Encounter for screening for malignant neoplasm of prostate: Secondary | ICD-10-CM

## 2017-11-08 DIAGNOSIS — I10 Essential (primary) hypertension: Secondary | ICD-10-CM

## 2017-11-08 NOTE — Progress Notes (Signed)
BP 130/80 (BP Location: Right Arm, Patient Position: Sitting, Cuff Size: Large)   Pulse 90   Temp 98.2 F (36.8 C) (Oral)   Ht 5' 5.5" (1.664 m)   Wt (!) 375 lb (170.1 kg)   SpO2 97%   BMI 61.45 kg/m    Subjective:    Patient ID: [name], male    DOB: [birth year], 52 y.o.   MRN: [id number]  HPI: [name] is a 52 y.o. male presenting on 11/08/2017 for Hypertension and Diabetes   HPI Pt says that he hasn't been approved for [facility] yet because they are still working on his medicaid denial.  Pt didn't get his labs drawn after last visit as instructed.    Pt with cough and congestion for a few days.   Relevant past medical, surgical, family and social history reviewed and updated as indicated. Interim medical history since our last visit reviewed. Allergies and medications reviewed and updated.   Current Outpatient Medications:  .  ferrous sulfate 325 (65 FE) MG EC tablet, Take 1 tablet (325 mg total) 2 (two) times daily by mouth., Disp: 60 tablet, Rfl: 0 .  lisinopril (PRINIVIL,ZESTRIL) 20 MG tablet, Take 1 tablet (20 mg total) by mouth daily., Disp: 90 tablet, Rfl: 1 .  metFORMIN (GLUCOPHAGE) 1000 MG tablet, Take 1 tablet (1,000 mg total) by mouth 2 (two) times daily with a meal., Disp: 180 tablet, Rfl: 1 .  metoprolol tartrate (LOPRESSOR) 50 MG tablet, Take 1 tablet (50 mg total) by mouth 2 (two) times daily., Disp: 60 tablet, Rfl: 1 .  oxyCODONE-acetaminophen (PERCOCET) 10-325 MG tablet, Take 1 tablet by mouth every 4 (four) hours as needed for pain., Disp: 150 tablet, Rfl: 0 .  sitaGLIPtin (JANUVIA) 100 MG tablet, Take 1 tablet (100 mg total) by mouth daily., Disp: 90 tablet, Rfl: 1  Review of Systems  Constitutional: Negative for appetite change, chills, diaphoresis, fatigue, fever and unexpected weight change.  HENT: Positive for congestion and dental problem. Negative for drooling, ear pain, facial swelling, hearing loss, mouth sores, sneezing, sore throat,  trouble swallowing and voice change.   Eyes: Positive for discharge and visual disturbance. Negative for pain, redness and itching.  Respiratory: Positive for cough and wheezing. Negative for choking and shortness of breath.   Cardiovascular: Negative for chest pain, palpitations and leg swelling.  Gastrointestinal: Negative for abdominal pain, blood in stool, constipation, diarrhea and vomiting.  Endocrine: Negative for cold intolerance, heat intolerance and polydipsia.  Genitourinary: Negative for decreased urine volume, dysuria and hematuria.  Musculoskeletal: Positive for arthralgias. Negative for back pain and gait problem.  Skin: Negative for rash.  Allergic/Immunologic: Positive for environmental allergies.  Neurological: Negative for seizures, syncope, light-headedness and headaches.  Hematological: Negative for adenopathy.  Psychiatric/Behavioral: Negative for agitation, dysphoric mood and suicidal ideas. The patient is not nervous/anxious.     Per HPI unless specifically indicated above     Objective:    BP 130/80 (BP Location: Right Arm, Patient Position: Sitting, Cuff Size: Large)   Pulse 90   Temp 98.2 F (36.8 C) (Oral)   Ht 5' 5.5" (1.664 m)   Wt (!) 375 lb (170.1 kg)   SpO2 97%   BMI 61.45 kg/m   Wt Readings from Last 3 Encounters:  11/08/17 (!) 375 lb (170.1 kg)  10/13/17 (!) 366 lb (166 kg)  10/11/17 (!) 368 lb (166.9 kg)    Physical Exam  Constitutional: He is oriented to person, place, and time.  He appears well-developed and well-nourished.  HENT:  Head: Normocephalic and atraumatic.  Right Ear: Hearing, tympanic membrane, external ear and ear canal normal.  Left Ear: Hearing, tympanic membrane, external ear and ear canal normal.  Nose: Rhinorrhea present.  Mouth/Throat: Uvula is midline and oropharynx is clear and moist. No uvula swelling. No oropharyngeal exudate, posterior oropharyngeal edema, posterior oropharyngeal erythema or tonsillar abscesses.   Neck: Neck supple.  Cardiovascular: Normal rate and regular rhythm.  Pulmonary/Chest: Effort normal and breath sounds normal. He has no wheezes.  Abdominal: Soft. Bowel sounds are normal. There is no hepatosplenomegaly. There is no tenderness.  Musculoskeletal: He exhibits no edema.  Lymphadenopathy:    He has no cervical adenopathy.  Neurological: He is alert and oriented to person, place, and time.  Skin: Skin is warm and dry.  Psychiatric: He has a normal mood and affect. His behavior is normal.  Vitals reviewed.       Assessment & Plan:    Encounter Diagnoses  Name Primary?  . Essential hypertension Yes  . Type 2 diabetes mellitus without complication, without long-term current use of insulin ([place])   . Hyperlipidemia, unspecified hyperlipidemia type   . Morbid obesity ([place])   . Screening for prostate cancer   . Bilateral impacted cerumen   . Anemia, unspecified type   . Acute nasopharyngitis     -pt to get fasting labs tomorrow morning -pt to continue current rx -counseled pt on diet and exercise -Pt to follow up 53months.  RTO sooner prn

## 2017-11-09 ENCOUNTER — Other Ambulatory Visit: Payer: Self-pay | Admitting: Orthopaedic Surgery

## 2017-11-09 MED ORDER — OXYCODONE-ACETAMINOPHEN 10-325 MG PO TABS: 1.0000 | ORAL_TABLET | ORAL | 0 refills | Status: DC | PRN

## 2017-11-09 NOTE — Telephone Encounter (Signed)
Dr. Brooke Bonito patient requests a refill on Oxycodone/Acetaminophen 10-325 mgs.  Qty  150  Sig: Take 1 tablet by mouth every 4 (four) hours as needed for pain.  Patient states he uses [facility]

## 2017-12-08 ENCOUNTER — Other Ambulatory Visit: Payer: Self-pay | Admitting: Orthopaedic Surgery

## 2017-12-08 MED ORDER — OXYCODONE-ACETAMINOPHEN 10-325 MG PO TABS: 1.0000 | ORAL_TABLET | ORAL | 0 refills | Status: DC | PRN

## 2017-12-08 NOTE — Telephone Encounter (Signed)
Patient of  Dr. Brooke Bonito requests refill on Oxycodone/Acetaminophen 0-325  Mgs.   Qty  150       Sig: Take 1 tablet by mouth every 4 (four) hours as needed for pain.    Patient states he uses [facility]

## 2017-12-27 DIAGNOSIS — Z139 Encounter for screening, unspecified: Secondary | ICD-10-CM

## 2017-12-27 LAB — GLUCOSE, POCT (MANUAL RESULT ENTRY): POC GLUCOSE: 138 mg/dL — AB (ref 70–99)

## 2017-12-27 NOTE — Congregational Nurse Program (Signed)
Congregational Nurse Program Note  Date of Encounter: 12/27/2017  Past Medical History: Past Medical History:  Diagnosis Date  . Arthritis   . Diabetes mellitus without complication ([place])    diagnosed about age 52  . Gout   . Hypertension    boarderline    Encounter Details: CNP Questionnaire - 12/27/17 1440      Questionnaire   Patient Status  Not Applicable    Race  Black or African American    Location Patient Served At  [facility]  Not Applicable    Uninsured  Uninsured (Subsequent visits/quarter)    Food  No food insecurities    Housing/Utilities  Yes, have permanent housing    Transportation  No transportation needs    Interpersonal Safety  Yes, feel physically and emotionally safe where you currently live    Medication  No medication insecurities    Medical Provider  Yes    Referrals  Not Applicable    ED Visit Averted  Not Applicable    Life-Saving Intervention Made  Not Applicable      52 y.o African American pt was seen today at the [facility] for a glucose reading. Pt PCP is the [facility] [place].   Vitals:  98.2-oral 82 162/102- pt stated he had not taken his blood pressure medication because he was worried of his glucose.    Pt stated he could not get him glucose meter to work and was worried his glucose was elevated. Pt asked if we could check his glucose and his reading here the center, his glucose read was 138. Pt stated he last had something to eat at 10 am.  Pts glucose was also checked using his glucometer and read and we also let pt check his own glucose using his glucometer.   Pt was given some steps as to how to use his glucometer.  1. Insert strip using the white and black side 2. Wait for a beep and until drops appear on meter screen 3. At an angle let the wick side of strip suck the blood  Pt also stated he has had congestion for about 3-4 months and has been taking mucinex with no relief. [facility]  [facility] let [facility] know the pt's meter was working and in the mean time asked If he would like to make an appointment for the congestion, pt said he will call if he is not better. Encouraged pt to do so.Pt was also reminded of his next scheduled appointment in June.  Pt was also reminded to take his blood pressure medication pt stated he will as soon as he got home.    Pt was grateful for the help.   Rachelanne Whidby R. [place], LPN [phone]

## 2018-01-04 ENCOUNTER — Other Ambulatory Visit: Payer: Self-pay | Admitting: Orthopaedic Surgery

## 2018-01-04 MED ORDER — OXYCODONE-ACETAMINOPHEN 10-325 MG PO TABS: 1.0000 | ORAL_TABLET | ORAL | 0 refills | Status: DC | PRN

## 2018-01-04 NOTE — Telephone Encounter (Signed)
Patient of Dr. Brooke Bonito requests refill on Oxycodone/Acetaminophen 10-325  Mgs.   Qty  150  Sig: Take 1 tablet by mouth every 4 (four) hours as needed for pain.  Patient states he uses [name]

## 2018-01-17 ENCOUNTER — Ambulatory Visit: Payer: Self-pay | Admitting: Orthopaedic Surgery

## 2018-01-31 ENCOUNTER — Encounter: Payer: Self-pay | Admitting: Physician Assistant

## 2018-02-02 ENCOUNTER — Encounter: Payer: Self-pay | Admitting: Orthopaedic Surgery

## 2018-02-02 ENCOUNTER — Ambulatory Visit: Payer: Self-pay | Admitting: Orthopaedic Surgery

## 2018-02-02 VITALS — BP 146/81 | HR 99 | Temp 97.6°F | Ht 65.5 in | Wt 383.0 lb

## 2018-02-02 DIAGNOSIS — M25562 Pain in left knee: Secondary | ICD-10-CM

## 2018-02-02 DIAGNOSIS — G8929 Other chronic pain: Secondary | ICD-10-CM

## 2018-02-02 DIAGNOSIS — M25561 Pain in right knee: Secondary | ICD-10-CM

## 2018-02-02 MED ORDER — OXYCODONE-ACETAMINOPHEN 10-325 MG PO TABS: 1.0000 | ORAL_TABLET | ORAL | 0 refills | Status: DC | PRN

## 2018-02-02 NOTE — Progress Notes (Signed)
CC:  My knees are the same, they hurt most of the time  He has pain in both knees, but has no giving way.  He has swelling and popping.  He is taking his medicine.  ROM of both knees is 1-100 with more pain on the right.  He has bilateral effusions and crepitus.  NV intact.  Encounter Diagnoses  Name Primary?  . Chronic pain of right knee Yes  . Chronic pain of left knee   . Morbid obesity due to excess calories ([place])    Return in three months.  I have reviewed the [facility] web site prior to prescribing narcotic medicine for this patient.  Electronically Signed Sanjuana Kava, MD 5/30/20192:04 PM

## 2018-02-08 ENCOUNTER — Encounter: Payer: Self-pay | Admitting: Physician Assistant

## 2018-02-08 ENCOUNTER — Ambulatory Visit: Payer: Self-pay | Admitting: Physician Assistant

## 2018-02-08 VITALS — BP 138/85 | HR 84 | Temp 97.4°F | Ht 65.5 in | Wt 386.0 lb

## 2018-02-08 DIAGNOSIS — Z9119 Patient's noncompliance with other medical treatment and regimen: Secondary | ICD-10-CM

## 2018-02-08 DIAGNOSIS — G8929 Other chronic pain: Secondary | ICD-10-CM

## 2018-02-08 DIAGNOSIS — I10 Essential (primary) hypertension: Secondary | ICD-10-CM

## 2018-02-08 DIAGNOSIS — M25562 Pain in left knee: Secondary | ICD-10-CM

## 2018-02-08 DIAGNOSIS — M25561 Pain in right knee: Secondary | ICD-10-CM

## 2018-02-08 DIAGNOSIS — E119 Type 2 diabetes mellitus without complications: Secondary | ICD-10-CM

## 2018-02-08 DIAGNOSIS — Z91199 Patient's noncompliance with other medical treatment and regimen due to unspecified reason: Secondary | ICD-10-CM

## 2018-02-08 DIAGNOSIS — E785 Hyperlipidemia, unspecified: Secondary | ICD-10-CM

## 2018-02-08 DIAGNOSIS — Z6841 Body Mass Index (BMI) 40.0 and over, adult: Secondary | ICD-10-CM

## 2018-02-08 NOTE — Progress Notes (Signed)
BP 138/85 (BP Location: Right Arm, Patient Position: Sitting, Cuff Size: Large)   Pulse 84   Temp (!) 97.4 F (36.3 C)   Ht 5' 5.5" (1.664 m)   Wt (!) 386 lb (175.1 kg)   SpO2 94%   BMI 63.26 kg/m    Subjective:    Patient ID: [name], male    DOB: [birth year], 52 y.o.   MRN: [id number]  HPI: [name] is a 52 y.o. male presenting on 02/08/2018 for Diabetes; Hypertension; and Hyperlipidemia   HPI   Pt has still not gotten labs drawn as instructed in February and reminded in March.   Pt is still working on getting [facility] care  Pt is only taking his metformin and metoprolol qd instead of bid as prescribed  He is feeling good  He isn't exercising regularly  Relevant past medical, surgical, family and social history reviewed and updated as indicated. Interim medical history since our last visit reviewed. Allergies and medications reviewed and updated.   Current Outpatient Medications:  .  ferrous sulfate 325 (65 FE) MG EC tablet, Take 1 tablet (325 mg total) 2 (two) times daily by mouth. (Patient taking differently: Take 325 mg by mouth daily. ), Disp: 60 tablet, Rfl: 0 .  lisinopril (PRINIVIL,ZESTRIL) 20 MG tablet, Take 1 tablet (20 mg total) by mouth daily., Disp: 90 tablet, Rfl: 1 .  metFORMIN (GLUCOPHAGE) 1000 MG tablet, Take 1 tablet (1,000 mg total) by mouth 2 (two) times daily with a meal. (Patient taking differently: Take 1,000 mg by mouth daily. ), Disp: 180 tablet, Rfl: 1 .  metoprolol tartrate (LOPRESSOR) 50 MG tablet, Take 1 tablet (50 mg total) by mouth 2 (two) times daily. (Patient taking differently: Take 50 mg by mouth daily. ), Disp: 60 tablet, Rfl: 1 .  oxyCODONE-acetaminophen (PERCOCET) 10-325 MG tablet, Take 1 tablet by mouth every 4 (four) hours as needed for pain., Disp: 150 tablet, Rfl: 0 .  sitaGLIPtin (JANUVIA) 100 MG tablet, Take 1 tablet (100 mg total) by mouth daily., Disp: 90 tablet, Rfl: 1    Review of Systems  Constitutional: Negative  for appetite change, chills, diaphoresis, fatigue, fever and unexpected weight change.  HENT: Positive for congestion and dental problem. Negative for drooling, ear pain, facial swelling, hearing loss, mouth sores, sneezing, sore throat, trouble swallowing and voice change.   Eyes: Negative for pain, discharge, redness, itching and visual disturbance.  Respiratory: Negative for cough, choking, shortness of breath and wheezing.   Cardiovascular: Negative for chest pain, palpitations and leg swelling.  Gastrointestinal: Negative for abdominal pain, blood in stool, constipation, diarrhea and vomiting.  Endocrine: Negative for cold intolerance, heat intolerance and polydipsia.  Genitourinary: Negative for decreased urine volume, dysuria and hematuria.  Musculoskeletal: Negative for arthralgias, back pain and gait problem.  Skin: Negative for rash.  Allergic/Immunologic: Negative for environmental allergies.  Neurological: Negative for seizures, syncope, light-headedness and headaches.  Hematological: Negative for adenopathy.  Psychiatric/Behavioral: Negative for agitation, dysphoric mood and suicidal ideas. The patient is not nervous/anxious.     Per HPI unless specifically indicated above     Objective:    BP 138/85 (BP Location: Right Arm, Patient Position: Sitting, Cuff Size: Large)   Pulse 84   Temp (!) 97.4 F (36.3 C)   Ht 5' 5.5" (1.664 m)   Wt (!) 386 lb (175.1 kg)   SpO2 94%   BMI 63.26 kg/m   Wt Readings from Last 3 Encounters:  02/08/18 (!) 386  lb (175.1 kg)  02/02/18 (!) 383 lb (173.7 kg)  12/27/17 (!) 383 lb 3.2 oz (173.8 kg)    Physical Exam  Constitutional: He is oriented to person, place, and time. He appears well-developed and well-nourished.  HENT:  Head: Normocephalic and atraumatic.  Neck: Neck supple.  Cardiovascular: Normal rate and regular rhythm.  Pulmonary/Chest: Effort normal and breath sounds normal. He has no wheezes.  Abdominal: Soft. Bowel sounds  are normal. There is no hepatosplenomegaly. There is no tenderness.  Musculoskeletal: He exhibits no edema.  Lymphadenopathy:    He has no cervical adenopathy.  Neurological: He is alert and oriented to person, place, and time.  Skin: Skin is warm and dry.  Psychiatric: He has a normal mood and affect. His behavior is normal.  Vitals reviewed.       Assessment & Plan:    Encounter Diagnoses  Name Primary?  . Personal history of noncompliance with medical treatment, presenting hazards to health Yes  . Essential hypertension   . Type 2 diabetes mellitus without complication, without long-term current use of insulin ([place])   . Hyperlipidemia, unspecified hyperlipidemia type   . Morbid obesity ([place])   . BMI 60.0-69.9, adult ([place])   . Chronic pain of right knee   . Chronic pain of left knee     -Pt counseled to bring meds to every appt -pt counseled to Take metoprolol and metformin bid as prescribed -pt to Get fastin labs drawn tomorrow morning -pt counseled to increase exercise and watch diet to help get weight down.  He has also been instructed to do this by orthopedist for his knee pain -pt to follow up in 3 months.  RTO sooner prn

## 2018-02-08 NOTE — Patient Instructions (Signed)
[email or web address]   

## 2018-03-01 ENCOUNTER — Telehealth: Payer: Self-pay | Admitting: Orthopaedic Surgery

## 2018-03-01 MED ORDER — OXYCODONE-ACETAMINOPHEN 10-325 MG PO TABS: 1.0000 | ORAL_TABLET | ORAL | 0 refills | Status: DC | PRN

## 2018-03-01 NOTE — Telephone Encounter (Signed)
Patient called and requests a refill on Oxycodone/Acetaminophen (Percocet) 10-325 mgs.  Qty 150  Sig: Take 1 tablet by mouth every 4 (four) hours as needed for pain.  Patient states he uses [facility]

## 2018-03-28 ENCOUNTER — Telehealth: Payer: Self-pay | Admitting: Orthopaedic Surgery

## 2018-03-28 NOTE — Telephone Encounter (Signed)
Oxycodone-Acetaminophen  10/325 MG  Qty 150 Tablets  PATIENT USES [facility]

## 2018-03-29 MED ORDER — OXYCODONE-ACETAMINOPHEN 10-325 MG PO TABS: 1.0000 | ORAL_TABLET | ORAL | 0 refills | Status: DC | PRN

## 2018-04-26 ENCOUNTER — Telehealth: Payer: Self-pay | Admitting: Orthopaedic Surgery

## 2018-04-26 MED ORDER — OXYCODONE-ACETAMINOPHEN 10-325 MG PO TABS: 1.0000 | ORAL_TABLET | ORAL | 0 refills | Status: DC | PRN

## 2018-04-26 NOTE — Telephone Encounter (Signed)
Patient requests refill on Oxycodone/Acetaminophen 10-325 mgs.  Qty  150       Sig: Take 1 tablet by mouth every 4 (four) hours as needed for pain.     Patient states he uses [facility]

## 2018-05-04 ENCOUNTER — Ambulatory Visit: Payer: Self-pay | Admitting: Orthopaedic Surgery

## 2018-05-10 ENCOUNTER — Ambulatory Visit: Payer: Self-pay | Admitting: Orthopaedic Surgery

## 2018-05-10 ENCOUNTER — Encounter: Payer: Self-pay | Admitting: Orthopaedic Surgery

## 2018-05-10 VITALS — BP 152/94 | HR 90 | Ht 65.5 in | Wt 373.0 lb

## 2018-05-10 DIAGNOSIS — M25561 Pain in right knee: Secondary | ICD-10-CM

## 2018-05-10 DIAGNOSIS — M25562 Pain in left knee: Secondary | ICD-10-CM

## 2018-05-10 DIAGNOSIS — G8929 Other chronic pain: Secondary | ICD-10-CM

## 2018-05-10 DIAGNOSIS — Z6841 Body Mass Index (BMI) 40.0 and over, adult: Secondary | ICD-10-CM

## 2018-05-10 NOTE — Progress Notes (Signed)
Patient [name], male DOB:08/03/1966, 52 y.o. QJF:[id number]  Chief Complaint  Patient presents with  . Knee Pain    Bilat    HPI  [name] is a 52 y.o. male who has bilateral knee pain.  He has swelling and popping but no giving way.  He has no new trauma. He is active.  He has good and bad days.   Body mass index is 61.13 kg/m.  The patient meets the AMA guidelines for Morbid (severe) obesity with a BMI > 40.0 and I have recommended weight loss.  ROS  Review of Systems  Constitutional: Positive for activity change.  HENT: Negative for congestion.   Respiratory: Positive for shortness of breath. Negative for cough.   Cardiovascular: Positive for leg swelling. Negative for chest pain.  Endocrine: Negative for cold intolerance.  Musculoskeletal: Positive for arthralgias, back pain, gait problem, joint swelling and myalgias.  Allergic/Immunologic: Positive for environmental allergies.  Neurological: Negative for numbness.  All other systems reviewed and are negative.   All other systems reviewed and are negative.  The following is a summary of the past history medically, past history surgically, known current medicines, social history and family history.  This information is gathered electronically by the computer from prior information and documentation.  I review this each visit and have found including this information at this point in the chart is beneficial and informative.    Past Medical History:  Diagnosis Date  . Arthritis   . Diabetes mellitus without complication ([place])    diagnosed about age 74  . Gout   . Hypertension    boarderline    Past Surgical History:  Procedure Laterality Date  . HERNIA REPAIR  2008  . MASS EXCISION Left age 79   L thigh- benign    Family History  Problem Relation Age of Onset  . Cancer Mother   . Diabetes Mother   . Heart failure Father   . Hypertension Father   . Diabetes Father     Social History Social  History   Tobacco Use  . Smoking status: Former Smoker    Packs/day: 0.50    Years: 5.00    Pack years: 2.50    Types: Cigarettes    Last attempt to quit: 1990    Years since quitting: 29.6  . Smokeless tobacco: Never Used  Substance Use Topics  . Alcohol use: No  . Drug use: No    No Known Allergies  Current Outpatient Medications  Medication Sig Dispense Refill  . ferrous sulfate 325 (65 FE) MG EC tablet Take 1 tablet (325 mg total) 2 (two) times daily by mouth. (Patient taking differently: Take 325 mg by mouth daily. ) 60 tablet 0  . lisinopril (PRINIVIL,ZESTRIL) 20 MG tablet Take 1 tablet (20 mg total) by mouth daily. 90 tablet 1  . metFORMIN (GLUCOPHAGE) 1000 MG tablet Take 1 tablet (1,000 mg total) by mouth 2 (two) times daily with a meal. (Patient taking differently: Take 1,000 mg by mouth daily. ) 180 tablet 1  . metoprolol tartrate (LOPRESSOR) 50 MG tablet Take 1 tablet (50 mg total) by mouth 2 (two) times daily. (Patient taking differently: Take 50 mg by mouth daily. ) 60 tablet 1  . oxyCODONE-acetaminophen (PERCOCET) 10-325 MG tablet Take 1 tablet by mouth every 4 (four) hours as needed for pain. 150 tablet 0  . sitaGLIPtin (JANUVIA) 100 MG tablet Take 1 tablet (100 mg total) by mouth daily. 90 tablet 1  No current facility-administered medications for this visit.      Physical Exam  Blood pressure (!) 152/94, pulse 90, height 5' 5.5" (1.664 m), weight (!) 373 lb (169.2 kg).  Constitutional: overall normal hygiene, normal nutrition, well developed, normal grooming, normal body habitus. Assistive device:none  Musculoskeletal: gait and station Limp right, muscle tone and strength are normal, no tremors or atrophy is present.  .  Neurological: coordination overall normal.  Deep tendon reflex/nerve stretch intact.  Sensation normal.  Cranial nerves II-XII intact.   Skin:   Normal overall no scars, lesions, ulcers or rashes. No psoriasis.  Psychiatric: Alert and  oriented x 3.  Recent memory intact, remote memory unclear.  Normal mood and affect. Well groomed.  Good eye contact.  Cardiovascular: overall no swelling, no varicosities, no edema bilaterally, normal temperatures of the legs and arms, no clubbing, cyanosis and good capillary refill.  Lymphatic: palpation is normal.  Both knees hurt, both have effusion and crepitus.  The right knee is more tender. ROM right 0-100 and left 0-105.  NV intact.  All other systems reviewed and are negative   The patient has been educated about the nature of the problem(s) and counseled on treatment options.  The patient appeared to understand what I have discussed and is in agreement with it.  Encounter Diagnoses  Name Primary?  . Chronic pain of right knee Yes  . Chronic pain of left knee   . Morbid obesity due to excess calories ([place])   . Body mass index 60.0-69.9, adult [facility])     PLAN Call if any problems.  Precautions discussed.  Continue current medications.   Return to clinic 3 months   Electronically Signed Sanjuana Kava, MD 9/4/20193:15 PM

## 2018-05-11 ENCOUNTER — Ambulatory Visit: Payer: Self-pay | Admitting: Physician Assistant

## 2018-05-25 ENCOUNTER — Telehealth: Payer: Self-pay | Admitting: Orthopaedic Surgery

## 2018-05-25 MED ORDER — OXYCODONE-ACETAMINOPHEN 10-325 MG PO TABS: 1.0000 | ORAL_TABLET | ORAL | 0 refills | Status: DC | PRN

## 2018-05-25 NOTE — Telephone Encounter (Signed)
Oxycodone-Acetaminophen 10/325 mg  Qty  150 Tablets   PATIENT USES [facility]

## 2018-05-29 IMAGING — DX DG CHEST 2V
2 series · 2 of 2 positions shown · non-contrast
Comparison: 06/10/2017.

CLINICAL DATA: Shortness of breath.  Cough .

EXAM:
CHEST  2 VIEW

[chest pa]
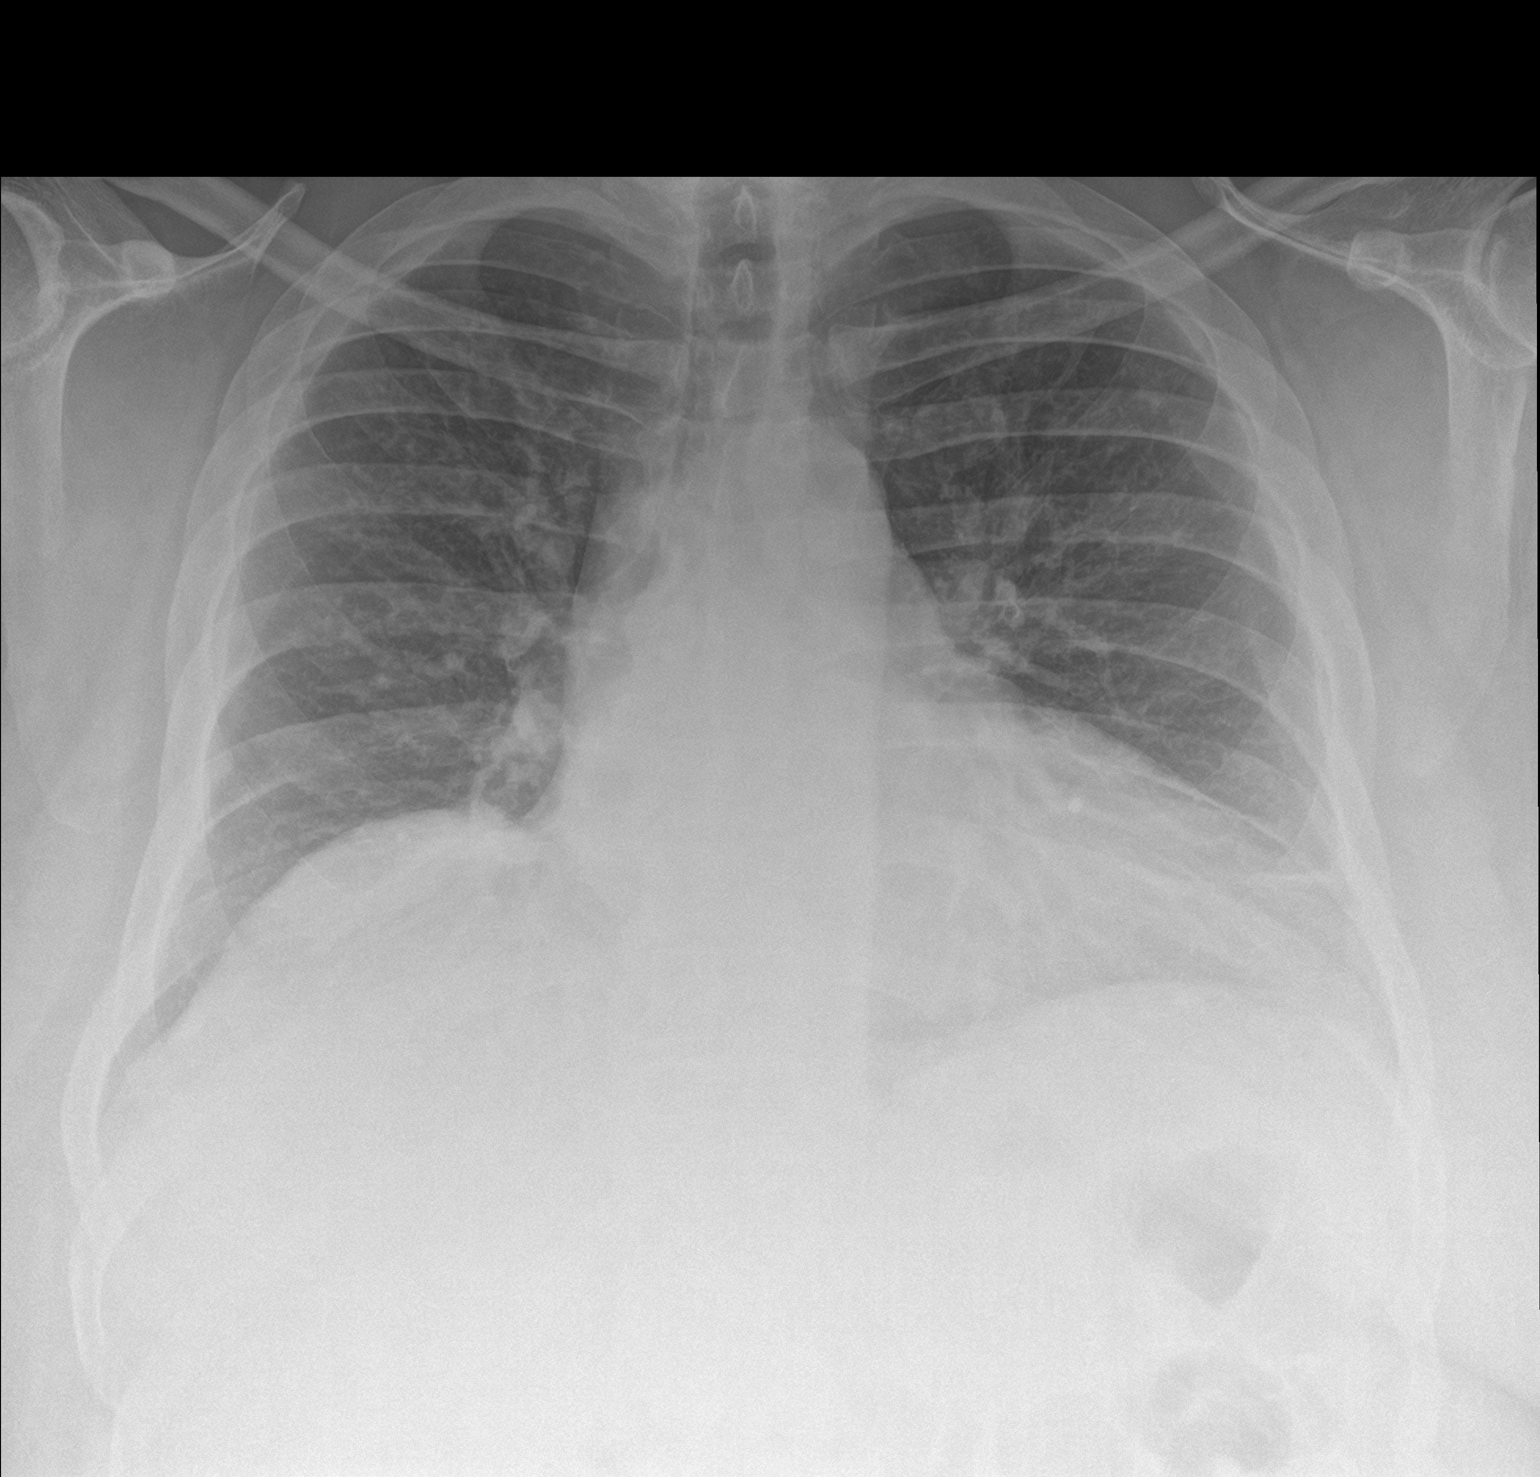

[chest lat]
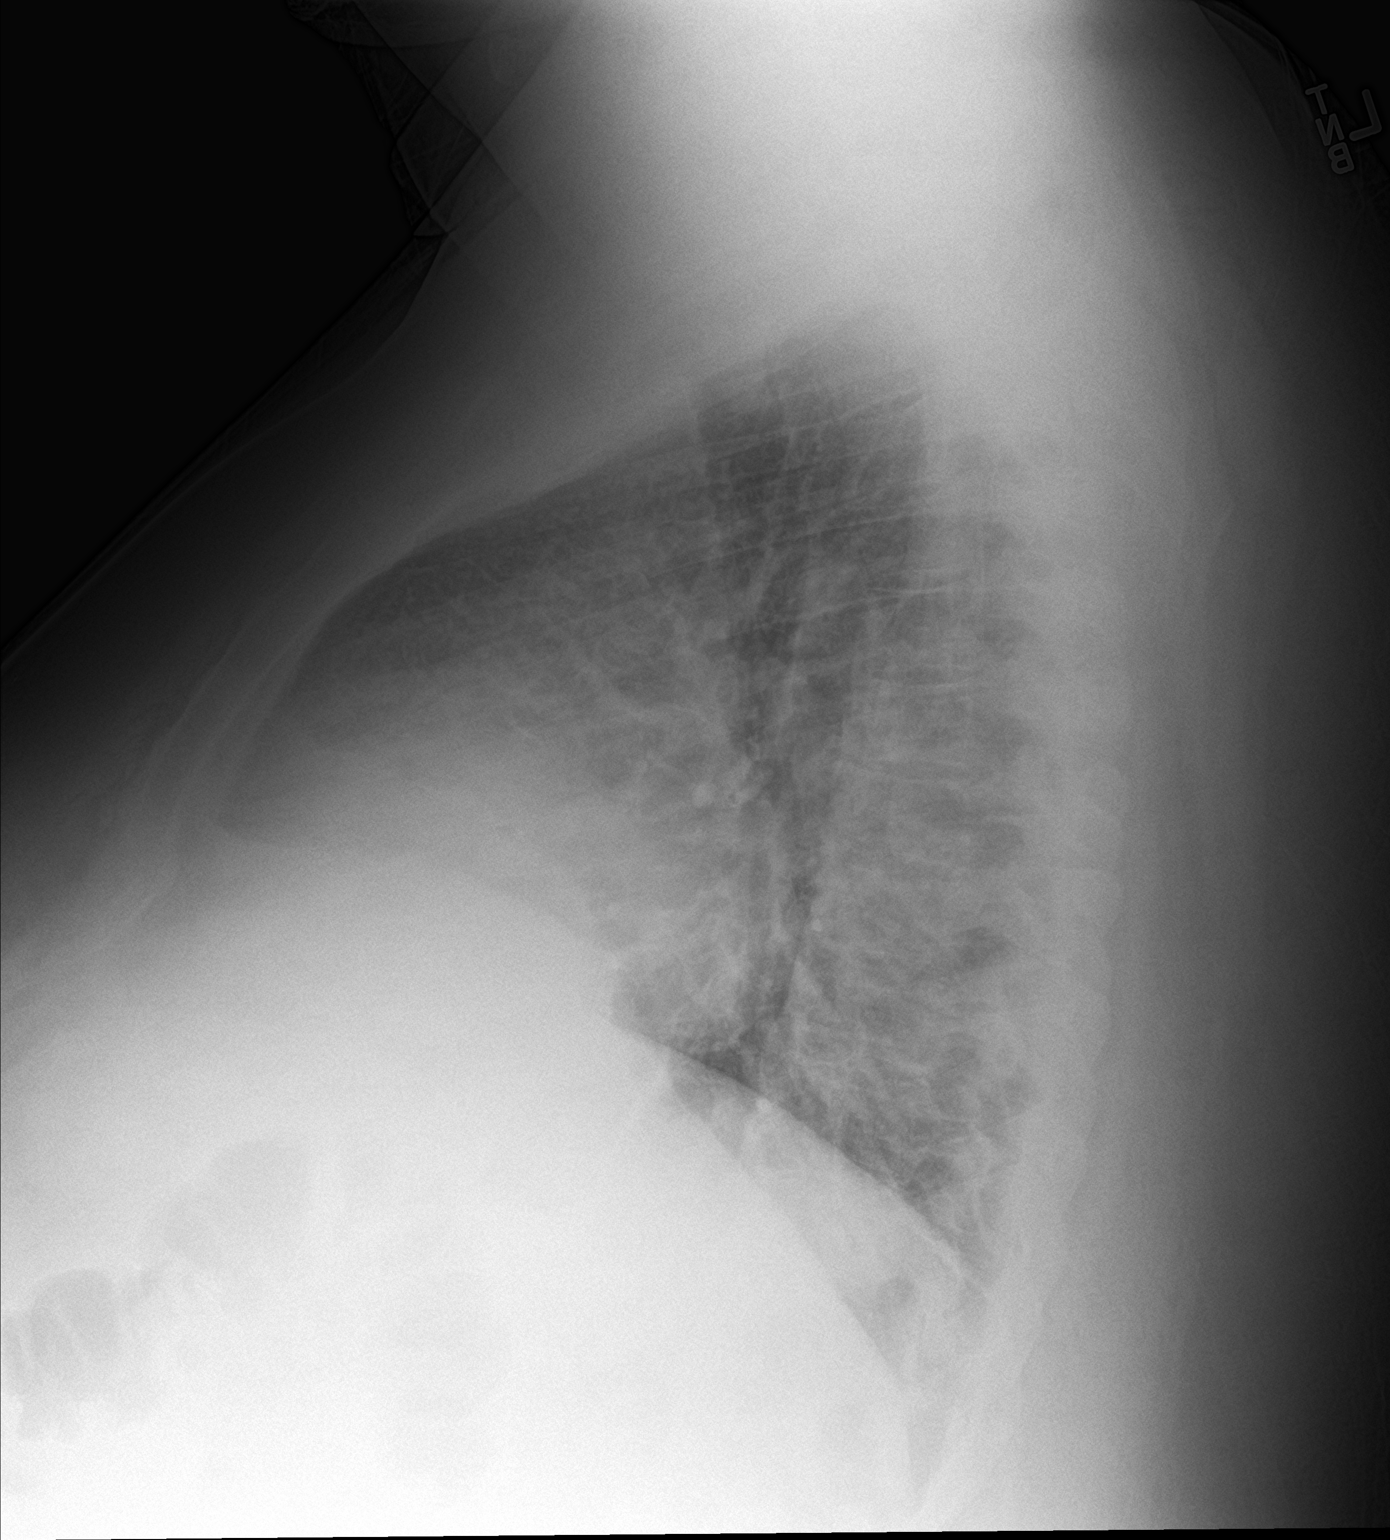

[2 of 2 positions shown; findings below may reference images not displayed]

FINDINGS: Mediastinum hilar structures normal. Cardiomegaly with mild
pulmonary vascular congestion. No overt pulmonary edema. Mild left
base subsegmental atelectasis. No prominent pleural effusion.
Pleural thickening noted bilaterally unchanged consistent scarring .
IMPRESSION: 1. Cardiomegaly with mild pulmonary venous congestion.

2. Mild left base subsegmental atelectasis.

## 2018-05-30 ENCOUNTER — Ambulatory Visit: Payer: Self-pay | Admitting: Physician Assistant

## 2018-05-31 IMAGING — CT CT IMAGE GUIDED DRAINAGE BY PERCUTANEOUS CATHETER
1 of 3 series · 10 of 32 positions shown, 16 images · non-contrast
Comparison: CT abdomen pelvis - 06/28/2017

INDICATION: History of hepatic abscesses. Please perform ultrasound/CT-guided
hepatic abscess drainage catheter placement.

EXAM:
ULTRASOUND AND CT-GUIDED HEPATIC ABSCESS DRAINAGE CATHETER PLACEMENT
x2

[Series 2: i-spiral 5.0 b40f · axial · 0.96mm/px · z∈[+1203,+1448]mm · 10 of 86 slices shown, 16 images]
[im 8/86  soft-tissue]
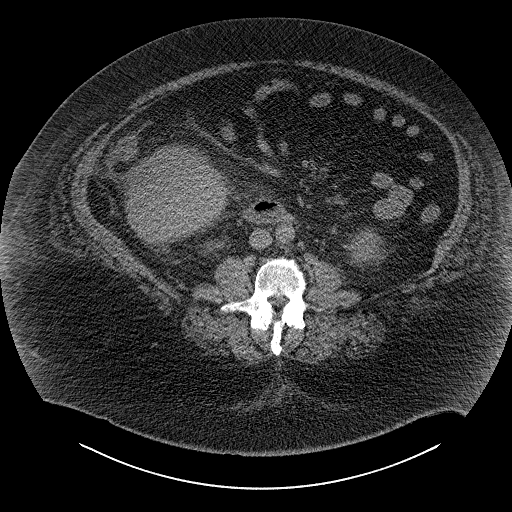
[im 8/86  bone]
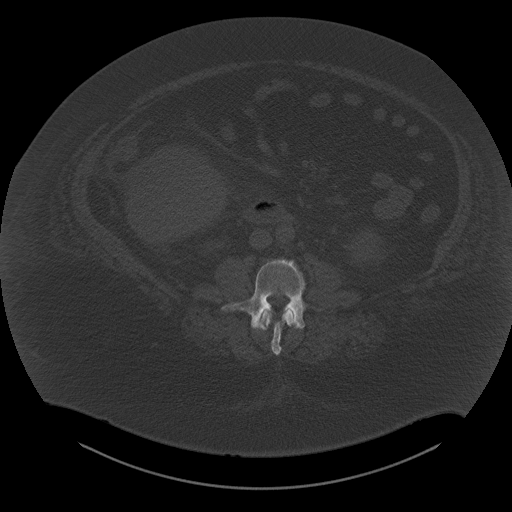
[im 16/86  soft-tissue]
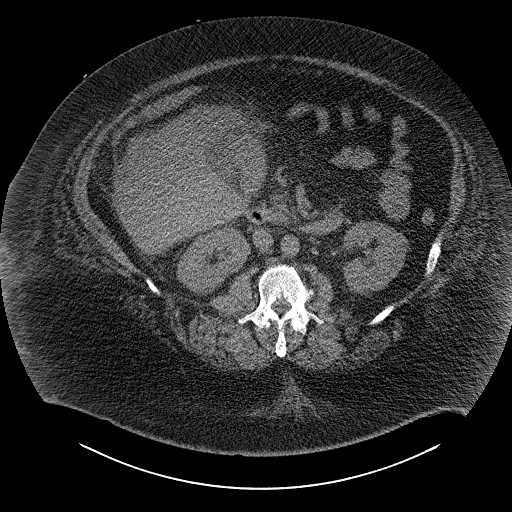
[im 24/86  soft-tissue]
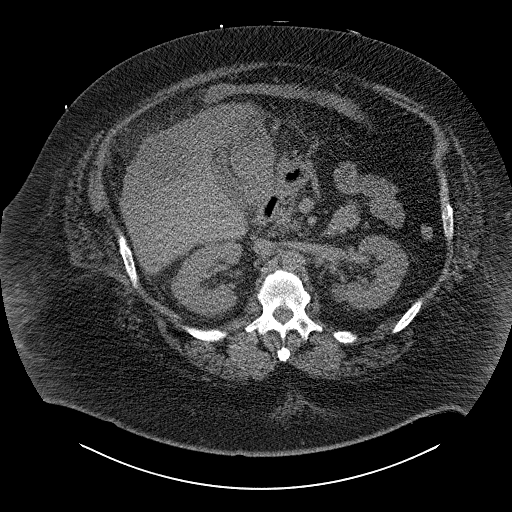
[im 31/86  soft-tissue]
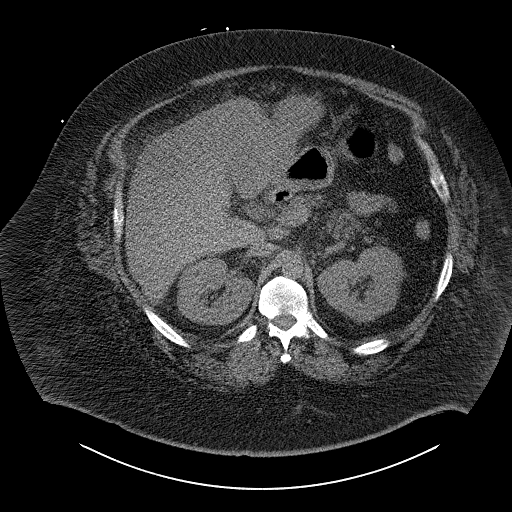
[im 39/86  soft-tissue]
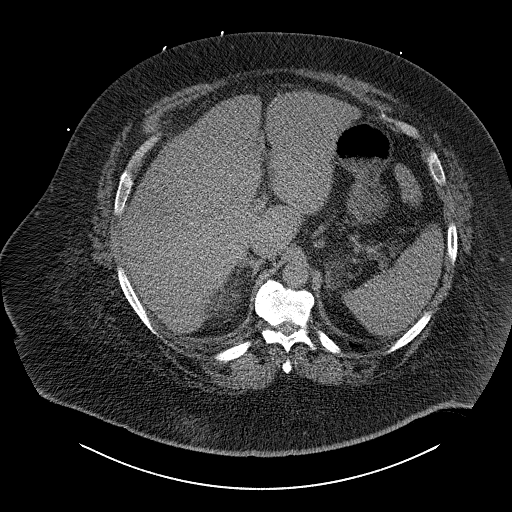
[im 47/86  soft-tissue]
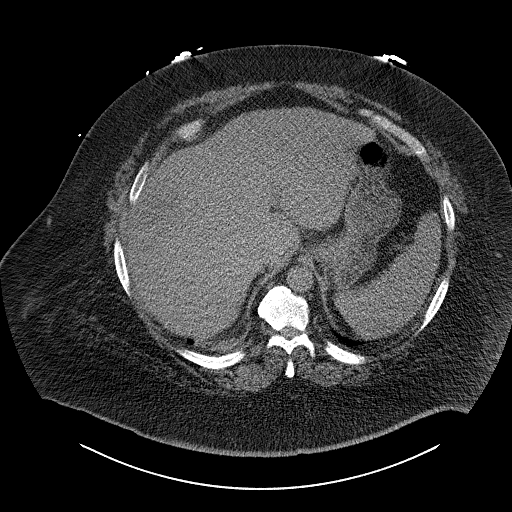
[im 55/86  soft-tissue]
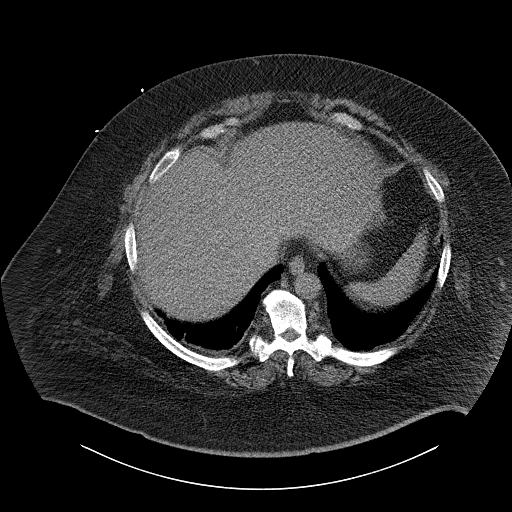
[im 55/86  lung]
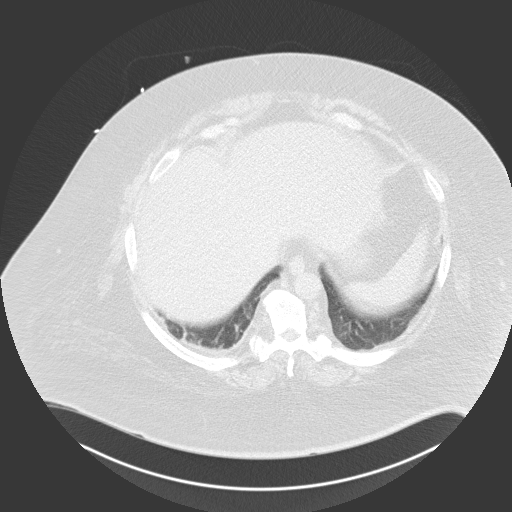
[im 62/86  soft-tissue]
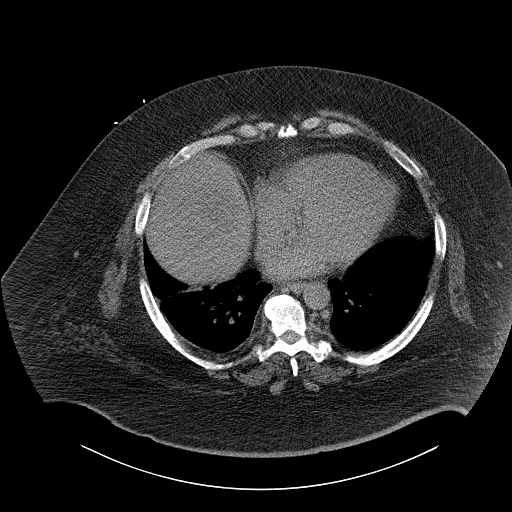
[im 62/86  lung]
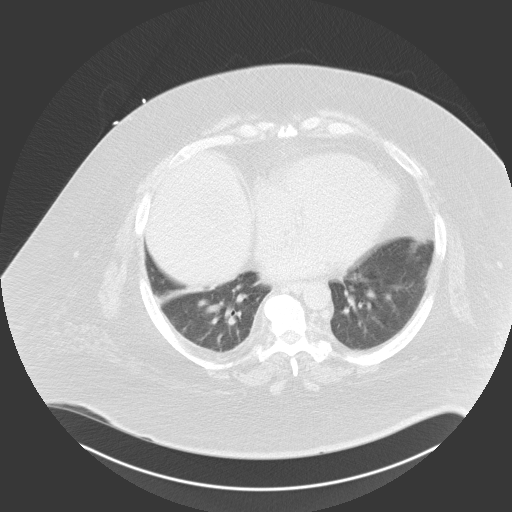
[im 70/86  soft-tissue]
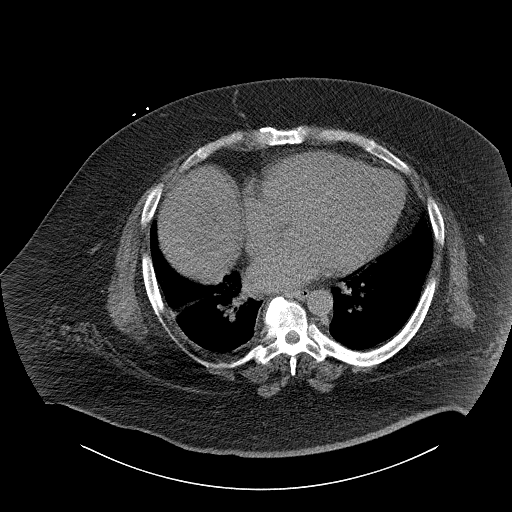
[im 70/86  lung]
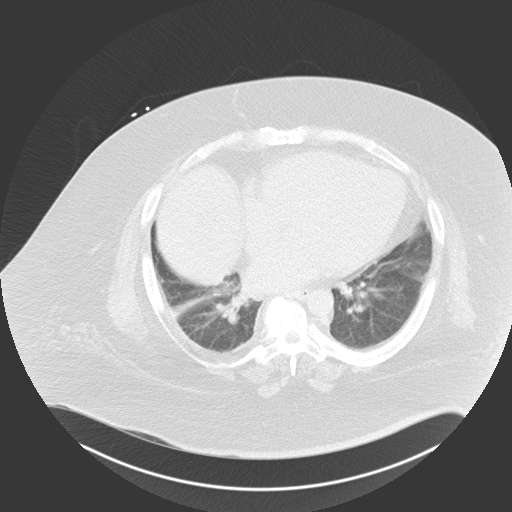
[im 70/86  bone]
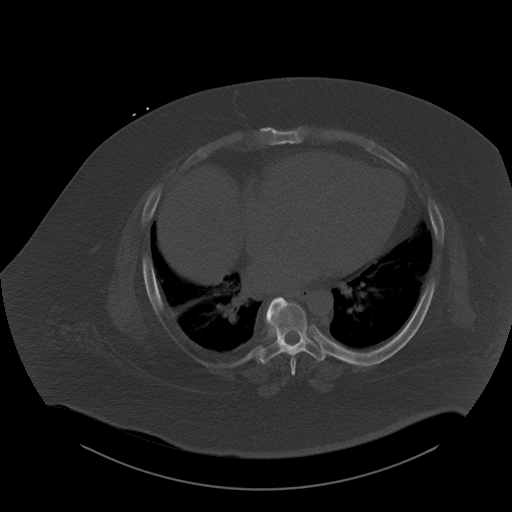
[im 78/86  soft-tissue]
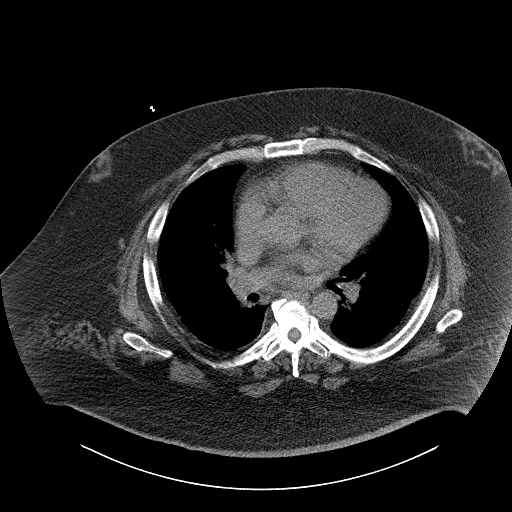
[im 78/86  lung]
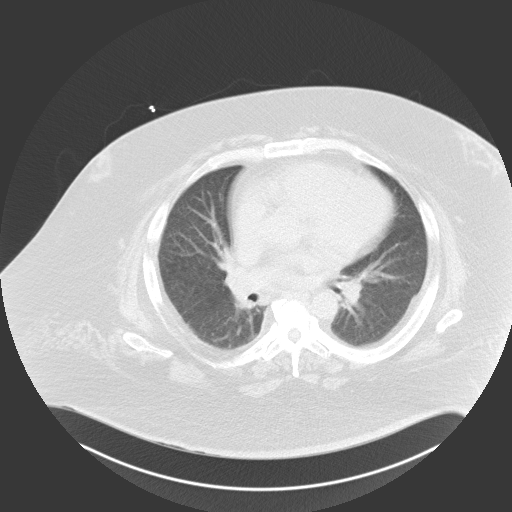

[10 of 32 positions shown; findings below may reference images not displayed]

MEDICATIONS:
The patient is currently admitted to the hospital and receiving
intravenous antibiotics. The antibiotics were administered within an
appropriate time frame prior to the initiation of the procedure.

ANESTHESIA/SEDATION:
Moderate (conscious) sedation was employed during this procedure. A
total of Versed 7 mg and Fentanyl 400 mcg, Dilaudid 2 mg IV and
Demerol 50 mg IV was administered intravenously.

Moderate Sedation Time: 25 minutes. The patient's level of
consciousness and vital signs were monitored continuously by
radiology nursing throughout the procedure under my direct
supervision.

CONTRAST:  None

COMPLICATIONS:
SIR LEVEL B - Normal therapy, includes overnight admission for
observation.

The patient developed rigors following placement of both hepatic
abscess drainage catheters which was treated with the administration
of 50 mg of Demerol IV.

PROCEDURE:
Informed written consent was obtained from the patient after a
discussion of the risks, benefits and alternatives to treatment. The
patient was placed supine on the CT gantry and a pre procedural CT
was performed re-demonstrating the known hepatic abscesses with
dominant ill-defined component within the superolateral aspect the
right lobe of the liver measuring approximately 5.3 x 4.9 cm (image
40, series 2), additional ill-defined component with the subcapsular
inferior aspect of the right lobe of the liver measuring
approximately 5.2 x 4.4 cm (image 63, series 2) an ill-defined
slightly hyperattenuating fluid collection within the porta hepatis
measuring approximately 8.5 x 4.4 cm (image 66, series 2).

All 3 of these fluid collections were identified sonographically.
The right superolateral abdomen as well as the midline of the upper
abdomen was prepped and draped in usual sterile fashion. The
overlying soft tissues were anesthetized with 1% lidocaine with
epinephrine.

Under direct ultrasound guidance, each hypoechoic collection were
sequentially accessed with an 18 gauge trocar needle. Both hepatic
samples yielded the aspiration of a small amount of purulent fluid,
while the porta hepatis collection yielded a small amount of bilious
appearing fluid. Short Amplatz wires were placed at all locations.

CT scanning demonstrated appropriate positioning of the Amplatz
wires within both hepatic abscesses, however the wire was not
appropriately coiled within the porta hepatis collection.

Next, both hepatic abscess drainage catheters were placed after the
tracks were serially dilated allowing placement of a 10 French
all-purpose drainage catheters.

As patient was complaining of discomfort lying on the table and then
developed rigors, additional intervention was not performed at this
time to attempt placement of a drainage catheter into the
perihepatic abscess.

Limited postprocedural scanning was performed demonstrating
appropriate positioning of both hepatic abscess drainage catheters.

Approximately 15 cc of purulent fluid was aspirated from the
superolateral abscess while approximately 35 cc was aspirated from
the abscess within the anterior aspect the right lobe of the liver.

Both drainage catheters were secured at the skin entrance site
within interrupted suture and connected to DACHUAN bulbs.

Dressings were placed.
IMPRESSION: 1. Successful ultrasound and CT-guided drainage catheter placement
into dominant abscess within the superolateral aspect the right lobe
of the liver yielding 15 cc of purulent material.
2. Successful ultrasound and CT-guided drainage catheter placement
into dominant abscess within the anterior inferior aspect the right
lobe of the liver yielding 35 cc of purulent material.
3. Attempted though ultimately unsuccessful placement of a
percutaneous drainage catheter into the ill-defined suspected
abscess within the porta hepatis abscess, secondary to patient's
inability to tolerate the procedure as well as development of rigors
following placement of the 2 additional hepatic abscess drainage
catheters.
4. A representative sample of aspirated purulent material was sent
to the laboratory for analysis.

PLAN:
- As the suspected abscess within the dome of the right lobe of the
liver and potential abscess within the porta hepatis remain
undrained, would have a low threshold to repeat an IV only abdominal
CT if patient has infectious symptoms do not defervesce within the
next several days, however note, both collections may be NOT
amenable to image guided drainage secondary to location and patient
body habitus.

## 2018-06-19 ENCOUNTER — Ambulatory Visit: Payer: Self-pay | Admitting: Physician Assistant

## 2018-06-20 ENCOUNTER — Ambulatory Visit: Payer: Self-pay | Admitting: Physician Assistant

## 2018-06-21 ENCOUNTER — Telehealth: Payer: Self-pay | Admitting: Orthopaedic Surgery

## 2018-06-21 ENCOUNTER — Ambulatory Visit: Payer: Self-pay | Admitting: Physician Assistant

## 2018-06-21 NOTE — Telephone Encounter (Signed)
Patient called for refill:  oxyCODONE-acetaminophen (PERCOCET) 10-325 MG tablet 150 tablet  -[facility]

## 2018-06-22 MED ORDER — OXYCODONE-ACETAMINOPHEN 10-325 MG PO TABS: 1.0000 | ORAL_TABLET | ORAL | 0 refills | Status: DC | PRN

## 2018-06-26 ENCOUNTER — Ambulatory Visit: Payer: Self-pay | Admitting: Physician Assistant

## 2018-06-26 ENCOUNTER — Encounter: Payer: Self-pay | Admitting: Physician Assistant

## 2018-06-26 VITALS — BP 154/87 | HR 96 | Temp 96.9°F | Ht 65.5 in | Wt 398.0 lb

## 2018-06-26 DIAGNOSIS — E1165 Type 2 diabetes mellitus with hyperglycemia: Secondary | ICD-10-CM

## 2018-06-26 DIAGNOSIS — I10 Essential (primary) hypertension: Secondary | ICD-10-CM

## 2018-06-26 DIAGNOSIS — Z125 Encounter for screening for malignant neoplasm of prostate: Secondary | ICD-10-CM

## 2018-06-26 DIAGNOSIS — F509 Eating disorder, unspecified: Secondary | ICD-10-CM

## 2018-06-26 DIAGNOSIS — D649 Anemia, unspecified: Secondary | ICD-10-CM

## 2018-06-26 DIAGNOSIS — Z91199 Patient's noncompliance with other medical treatment and regimen due to unspecified reason: Secondary | ICD-10-CM

## 2018-06-26 DIAGNOSIS — E785 Hyperlipidemia, unspecified: Secondary | ICD-10-CM

## 2018-06-26 DIAGNOSIS — Z9119 Patient's noncompliance with other medical treatment and regimen: Secondary | ICD-10-CM

## 2018-06-26 DIAGNOSIS — Z6841 Body Mass Index (BMI) 40.0 and over, adult: Secondary | ICD-10-CM

## 2018-06-26 NOTE — Progress Notes (Signed)
BP (!) 154/87 (BP Location: Right Arm, Patient Position: Sitting, Cuff Size: Normal)   Pulse 96   Temp (!) 96.9 F (36.1 C) (Oral)   Ht 5' 5.5" (1.664 m)   Wt (!) 398 lb (180.5 kg)   SpO2 95%   BMI 65.22 kg/m    Subjective:    Patient ID: [name], male    DOB: 1965/12/09, 52 y.o.   MRN: [id number]  HPI: [name] is a 52 y.o. male presenting on 06/26/2018 for Diabetes; Hyperlipidemia; and Hypertension   HPI   He is still taking his meds qd instead of bid as prescribed.  Pt has still not gotten labs drawn that were ordered to be done in March. Pt says that he has an eating disorder.  He says that he just eats all the time.  He says sometimes he will get out of the bed at 4am and eat.    Relevant past medical, surgical, family and social history reviewed and updated as indicated. Interim medical history since our last visit reviewed. Allergies and medications reviewed and updated.   Current Outpatient Medications:  .  ferrous sulfate 325 (65 FE) MG EC tablet, Take 1 tablet (325 mg total) 2 (two) times daily by mouth. (Patient taking differently: Take 325 mg by mouth daily. ), Disp: 60 tablet, Rfl: 0 .  lisinopril (PRINIVIL,ZESTRIL) 20 MG tablet, Take 1 tablet (20 mg total) by mouth daily., Disp: 90 tablet, Rfl: 1 .  metFORMIN (GLUCOPHAGE) 1000 MG tablet, Take 1 tablet (1,000 mg total) by mouth 2 (two) times daily with a meal. (Patient taking differently: Take 1,000 mg by mouth daily. ), Disp: 180 tablet, Rfl: 1 .  metoprolol tartrate (LOPRESSOR) 50 MG tablet, Take 1 tablet (50 mg total) by mouth 2 (two) times daily. (Patient taking differently: Take 50 mg by mouth daily. ), Disp: 60 tablet, Rfl: 1 .  oxyCODONE-acetaminophen (PERCOCET) 10-325 MG tablet, Take 1 tablet by mouth every 4 (four) hours as needed for pain., Disp: 150 tablet, Rfl: 0 .  sitaGLIPtin (JANUVIA) 100 MG tablet, Take 1 tablet (100 mg total) by mouth daily., Disp: 90 tablet, Rfl: 1  Review of Systems   Constitutional: Negative for appetite change, chills, diaphoresis, fatigue, fever and unexpected weight change.  HENT: Negative for congestion, dental problem, drooling, ear pain, facial swelling, hearing loss, mouth sores, sneezing, sore throat, trouble swallowing and voice change.   Eyes: Negative for pain, discharge, redness, itching and visual disturbance.  Respiratory: Negative for cough, choking, shortness of breath and wheezing.   Cardiovascular: Negative for chest pain, palpitations and leg swelling.  Gastrointestinal: Negative for abdominal pain, blood in stool, constipation, diarrhea and vomiting.  Endocrine: Negative for cold intolerance, heat intolerance and polydipsia.  Genitourinary: Negative for decreased urine volume, dysuria and hematuria.  Musculoskeletal: Negative for arthralgias, back pain and gait problem.  Skin: Negative for rash.  Allergic/Immunologic: Negative for environmental allergies.  Neurological: Negative for seizures, syncope, light-headedness and headaches.  Hematological: Negative for adenopathy.  Psychiatric/Behavioral: Negative for agitation, dysphoric mood and suicidal ideas. The patient is not nervous/anxious.     Per HPI unless specifically indicated above     Objective:    BP (!) 154/87 (BP Location: Right Arm, Patient Position: Sitting, Cuff Size: Normal)   Pulse 96   Temp (!) 96.9 F (36.1 C) (Oral)   Ht 5' 5.5" (1.664 m)   Wt (!) 398 lb (180.5 kg)   SpO2 95%   BMI 65.22 kg/m  Wt Readings from Last 3 Encounters:  06/26/18 (!) 398 lb (180.5 kg)  05/10/18 (!) 373 lb (169.2 kg)  02/08/18 (!) 386 lb (175.1 kg)    Physical Exam  Constitutional: He is oriented to person, place, and time. He appears well-developed and well-nourished.  HENT:  Head: Normocephalic and atraumatic.  Neck: Neck supple.  Cardiovascular: Normal rate and regular rhythm.  Pulmonary/Chest: Effort normal and breath sounds normal. He has no wheezes.  Abdominal: Soft.  Bowel sounds are normal. There is no hepatosplenomegaly. There is no tenderness.  Musculoskeletal: He exhibits no edema.  Lymphadenopathy:    He has no cervical adenopathy.  Neurological: He is alert and oriented to person, place, and time.  Skin: Skin is warm and dry.  Psychiatric: He has a normal mood and affect. His behavior is normal.  Vitals reviewed.       Assessment & Plan:    Encounter Diagnoses  Name Primary?  . Personal history of noncompliance with medical treatment, presenting hazards to health Yes  . Essential hypertension   . Uncontrolled type 2 diabetes mellitus with hyperglycemia ([place])   . Hyperlipidemia, unspecified hyperlipidemia type   . Morbid obesity ([place])   . BMI 60.0-69.9, adult ([place])   . Anemia, unspecified type   . Screening for prostate cancer   . Eating disorder, unspecified type      -Refer to dietician for eating disorder.  Pt also instructed to go to [facility] for counseling -Pt reminded to turn in [facility]  -pt to get labs drawn before next OV -pt counseled to take meds as prescribed  -pt to follow up 4 weeks.  RTO sooner prn

## 2018-07-20 ENCOUNTER — Telehealth: Payer: Self-pay | Admitting: Orthopaedic Surgery

## 2018-07-20 ENCOUNTER — Other Ambulatory Visit (HOSPITAL_COMMUNITY)
Admission: RE | Admit: 2018-07-20 | Discharge: 2018-07-20 | Disposition: A | Discharge status: Home / Self Care | Payer: Self-pay | Source: Ambulatory Visit | Attending: Physician Assistant | Admitting: Physician Assistant

## 2018-07-20 DIAGNOSIS — I10 Essential (primary) hypertension: Secondary | ICD-10-CM | POA: Insufficient documentation

## 2018-07-20 DIAGNOSIS — D649 Anemia, unspecified: Secondary | ICD-10-CM | POA: Insufficient documentation

## 2018-07-20 DIAGNOSIS — E1165 Type 2 diabetes mellitus with hyperglycemia: Secondary | ICD-10-CM | POA: Insufficient documentation

## 2018-07-20 DIAGNOSIS — Z125 Encounter for screening for malignant neoplasm of prostate: Secondary | ICD-10-CM | POA: Insufficient documentation

## 2018-07-20 DIAGNOSIS — E785 Hyperlipidemia, unspecified: Secondary | ICD-10-CM | POA: Insufficient documentation

## 2018-07-20 LAB — PSA: Prostatic Specific Antigen: 0.25 ng/mL (ref 0.00–4.00)

## 2018-07-20 LAB — LIPID PANEL
CHOL/HDL RATIO: 3.7 ratio
Cholesterol: 191 mg/dL (ref 0–200)
HDL: 52 mg/dL (ref >40)
LDL CALC: 121 mg/dL — AB (ref 0–99)
TRIGLYCERIDES: 89 mg/dL (ref <150)
VLDL: 18 mg/dL (ref 0–40)

## 2018-07-20 LAB — COMPREHENSIVE METABOLIC PANEL
ALT: 22 U/L (ref 0–44)
ANION GAP: 7 (ref 5–15)
AST: 18 U/L (ref 15–41)
Albumin: 3.5 g/dL (ref 3.5–5.0)
Alkaline Phosphatase: 99 U/L (ref 38–126)
BUN: 10 mg/dL (ref 6–20)
CALCIUM: 9.2 mg/dL (ref 8.9–10.3)
CHLORIDE: 100 mmol/L (ref 98–111)
CO2: 30 mmol/L (ref 22–32)
Creatinine, Ser: 0.78 mg/dL (ref 0.61–1.24)
Glucose, Bld: 270 mg/dL — ABNORMAL HIGH (ref 70–99)
Potassium: 4.8 mmol/L (ref 3.5–5.1)
Sodium: 137 mmol/L (ref 135–145)
Total Bilirubin: 0.8 mg/dL (ref 0.3–1.2)
Total Protein: 7.7 g/dL (ref 6.5–8.1)

## 2018-07-20 LAB — CBC
HCT: 46.3 % (ref 39.0–52.0)
Hemoglobin: 14.1 g/dL (ref 13.0–17.0)
MCH: 27 pg (ref 26.0–34.0)
MCHC: 30.5 g/dL (ref 30.0–36.0)
MCV: 88.7 fL (ref 80.0–100.0)
NRBC: 0 % (ref 0.0–0.2)
Platelets: 268 10*3/uL (ref 150–400)
RBC: 5.22 MIL/uL (ref 4.22–5.81)
RDW: 14.6 % (ref 11.5–15.5)
WBC: 12.4 10*3/uL — ABNORMAL HIGH (ref 4.0–10.5)

## 2018-07-20 LAB — HEMOGLOBIN A1C
HEMOGLOBIN A1C: 9.6 % — AB (ref 4.8–5.6)
MEAN PLASMA GLUCOSE: 228.82 mg/dL

## 2018-07-20 MED ORDER — OXYCODONE-ACETAMINOPHEN 10-325 MG PO TABS: 1.0000 | ORAL_TABLET | ORAL | 0 refills | Status: DC | PRN

## 2018-07-20 NOTE — Telephone Encounter (Signed)
Patient called for refill of:  oxyCODONE-acetaminophen (PERCOCET) 10-325 MG tablet 150 tablet 0   - [facility]     - next appointment 08/09/18, aware.

## 2018-07-21 LAB — MICROALBUMIN, URINE: Microalb, Ur: 11 ug/mL — ABNORMAL HIGH

## 2018-07-24 ENCOUNTER — Ambulatory Visit: Payer: Self-pay | Admitting: Physician Assistant

## 2018-07-24 ENCOUNTER — Encounter: Payer: Self-pay | Admitting: Physician Assistant

## 2018-07-24 VITALS — BP 151/84 | HR 97 | Temp 96.7°F | Ht 65.5 in | Wt >= 6400 oz

## 2018-07-24 DIAGNOSIS — F509 Eating disorder, unspecified: Secondary | ICD-10-CM

## 2018-07-24 DIAGNOSIS — E11622 Type 2 diabetes mellitus with other skin ulcer: Secondary | ICD-10-CM

## 2018-07-24 DIAGNOSIS — I10 Essential (primary) hypertension: Secondary | ICD-10-CM

## 2018-07-24 DIAGNOSIS — E1165 Type 2 diabetes mellitus with hyperglycemia: Secondary | ICD-10-CM

## 2018-07-24 DIAGNOSIS — E785 Hyperlipidemia, unspecified: Secondary | ICD-10-CM

## 2018-07-24 DIAGNOSIS — L97909 Non-pressure chronic ulcer of unspecified part of unspecified lower leg with unspecified severity: Secondary | ICD-10-CM

## 2018-07-24 DIAGNOSIS — Z6841 Body Mass Index (BMI) 40.0 and over, adult: Secondary | ICD-10-CM

## 2018-07-24 MED ORDER — CEPHALEXIN 500 MG PO CAPS: 500.0000 mg | ORAL_CAPSULE | Freq: Three times a day (TID) | ORAL | 0 refills | Status: DC

## 2018-07-24 NOTE — Patient Instructions (Signed)
Insulin Injection Instructions, Using Insulin Pens, Adult A subcutaneous injection is a shot of medicine that is injected into the layer of fat between skin and muscle. People with type 1 diabetes must take insulin because their bodies do not make it. People with type 2 diabetes may need to take insulin. There are many different types of insulin. The type of insulin that you take may determine how many injections you give yourself and when you need to take the injections. Choosing a site for injection Insulin absorption varies from site to site. It is best to inject insulin within the same body area, using a different spot in that area for each injection. Do not inject the insulin in the same spot for each injection. There are five main areas that can be used for injecting. These areas include:  Abdomen. This is the preferred area.  Front of thigh.  Upper, outer side of thigh.  Back of upper arm.  Buttocks.  Using an insulin pen First, follow the steps for Getting Ready, then continue with the steps for Injecting the Insulin. Getting Ready 1. Wash your hands with soap and water. If soap and water are not available, use hand sanitizer. 2. Check the expiration date and type of insulin in the pen. 3. If you are using CLEAR insulin, check to see that it is clear and free of clumps. 4. If you are using CLOUDY insulin, gently roll the pen between your palms several times, or tip the pen up and down several times to mix up the medicine. Do not shake the pen. 5. Remove the cap from the insulin pen. 6. Use an alcohol wipe to clean the rubber stopper of the pen cartridge. 7. Remove the protective paper tab from the disposable needle. Do not let the needle touch anything. 8. Screw the needle onto the pen. 9. Remove the outer and inner plastic covers from the needle. Do not throw away the outer plastic cover yet. 10. Prime the insulin pen by turning the button (dial) to 2 units. Hold the pen with the  needle pointing up, and push the button on the opposite end of the pen until a drop of insulin appears at the needle tip. If no insulin appears, repeat this step. 11. Dial the number of units of insulin that you will be injecting. Injecting the Insulin  1. Use an alcohol wipe to clean the site where you will be injecting the needle. Let the site air-dry. 2. Hold the pen in the palm of your writing hand with your thumb on the top. 3. If directed by your health care provider, use your other hand to pinch and hold about an inch of skin at the injection site. Do not directly touch the cleaned part of the skin. 4. Gently but quickly, put the needle straight into the skin. The needle should be at a 90-degree angle (perpendicular) to the skin, as if to form the letter "L." ? For example, if you are giving an injection in the abdomen, the abdomen forms one "leg" of the "L" and the needle forms the other "leg" of the "L." 5. For adults who have a small amount of body fat, the needle may need to be injected at a 45-degree angle instead. Your health care provider will tell you if this is necessary. ? A 45-degree angle looks like the letter "V." 6. When the needle is completely inserted into the skin, use the thumb of your writing hand to push the top   button of the pen down all the way to inject the insulin. 7. Let go of the skin that you are pinching. Continue to hold the pen in place with your writing hand. 8. Wait five seconds, then pull the needle straight out of the skin. 9. Carefully put the larger (outer) plastic cover of the needle back over the needle, then unscrew the capped needle and discard it in a sharps container, such as an empty plastic bottle with a cover. 10. Put the plastic cap back on the insulin pen. Throwing away supplies  Discard all used needles in a puncture-proof sharps disposal container. You can ask your local pharmacy about where you can get this kind of disposal container, or you  can use an empty liquid laundry detergent bottle that has a cover.  Follow the disposal regulations for the area where you live. Do not use any needle more than one time.  Throw away empty disposable pens in the regular trash. What questions should I ask my health care provider?  How often should I be taking insulin?  How often should I check my blood glucose?  What amount of insulin should I be taking at each time?  What are the side effects?  What should I do if my blood glucose is too high?  What should I do if my blood glucose is too low?  What should I do if I forget to take my insulin?  What number should I call if I have questions? Where can I get more information?  American Diabetes Association (ADA): www.diabetes.org  American Association of Diabetes Educators (AADE) Patient Resources: https://www.diabeteseducator.org/patient-resources This information is not intended to replace advice given to you by your health care provider. Make sure you discuss any questions you have with your health care provider. Document Released: 09/26/2015 Document Revised: 01/29/2016 Document Reviewed: 09/26/2015 Elsevier Interactive Patient Education  2018 Elsevier Inc.  

## 2018-07-24 NOTE — Progress Notes (Signed)
BP (!) 151/84 (BP Location: Right Arm, Patient Position: Sitting, Cuff Size: Large)   Pulse 97   Temp (!) 96.7 F (35.9 C) (Oral)   Ht 5' 5.5" (1.664 m)   Wt (!) 410 lb (186 kg)   SpO2 94%   BMI 67.19 kg/m    Subjective:    Patient ID: [name], male    DOB: 07/19/1966, 52 y.o.   MRN: [id number]  HPI: [name] is a 52 y.o. male presenting on 07/24/2018 for Hypertension and Obesity   HPI   Pt has appt with dietician in december and [facility] in January  Pt complains of a sore on his right lower leg. He says it hurts but he doesn't really know what it looks like because he can't see it due to his weight.  Relevant past medical, surgical, family and social history reviewed and updated as indicated. Interim medical history since our last visit reviewed. Allergies and medications reviewed and updated.   Current Outpatient Medications:  .  ferrous sulfate 325 (65 FE) MG EC tablet, Take 1 tablet (325 mg total) 2 (two) times daily by mouth. (Patient taking differently: Take 325 mg by mouth daily. ), Disp: 60 tablet, Rfl: 0 .  lisinopril (PRINIVIL,ZESTRIL) 20 MG tablet, Take 1 tablet (20 mg total) by mouth daily., Disp: 90 tablet, Rfl: 1 .  metFORMIN (GLUCOPHAGE) 1000 MG tablet, Take 1 tablet (1,000 mg total) by mouth 2 (two) times daily with a meal., Disp: 180 tablet, Rfl: 1 .  metoprolol tartrate (LOPRESSOR) 50 MG tablet, Take 1 tablet (50 mg total) by mouth 2 (two) times daily., Disp: 60 tablet, Rfl: 1 .  oxyCODONE-acetaminophen (PERCOCET) 10-325 MG tablet, Take 1 tablet by mouth every 4 (four) hours as needed for pain., Disp: 150 tablet, Rfl: 0 .  sitaGLIPtin (JANUVIA) 100 MG tablet, Take 1 tablet (100 mg total) by mouth daily., Disp: 90 tablet, Rfl: 1  Review of Systems  Constitutional: Negative for appetite change, chills, diaphoresis, fatigue, fever and unexpected weight change.  HENT: Negative for congestion, dental problem, drooling, ear pain, facial swelling, hearing  loss, mouth sores, sneezing, sore throat, trouble swallowing and voice change.   Eyes: Negative for pain, discharge, redness, itching and visual disturbance.  Respiratory: Negative for cough, choking, shortness of breath and wheezing.   Cardiovascular: Negative for chest pain, palpitations and leg swelling.  Gastrointestinal: Negative for abdominal pain, blood in stool, constipation, diarrhea and vomiting.  Endocrine: Negative for cold intolerance, heat intolerance and polydipsia.  Genitourinary: Negative for decreased urine volume, dysuria and hematuria.  Musculoskeletal: Negative for arthralgias, back pain and gait problem.  Skin: Negative for rash.  Allergic/Immunologic: Positive for environmental allergies.  Neurological: Negative for seizures, syncope, light-headedness and headaches.  Hematological: Negative for adenopathy.  Psychiatric/Behavioral: Negative for agitation, dysphoric mood and suicidal ideas. The patient is not nervous/anxious.     Per HPI unless specifically indicated above     Objective:    BP (!) 151/84 (BP Location: Right Arm, Patient Position: Sitting, Cuff Size: Large)   Pulse 97   Temp (!) 96.7 F (35.9 C) (Oral)   Ht 5' 5.5" (1.664 m)   Wt (!) 410 lb (186 kg)   SpO2 94%   BMI 67.19 kg/m   Wt Readings from Last 3 Encounters:  07/24/18 (!) 410 lb (186 kg)  06/26/18 (!) 398 lb (180.5 kg)  05/10/18 (!) 373 lb (169.2 kg)    Physical Exam  Constitutional: He is oriented to person,  place, and time. He appears well-developed and well-nourished.  HENT:  Head: Normocephalic and atraumatic.  Neck: Neck supple.  Cardiovascular: Normal rate and regular rhythm.  Pulmonary/Chest: Effort normal and breath sounds normal. He has no wheezes.  Abdominal: Soft. Bowel sounds are normal. There is no hepatosplenomegaly. There is no tenderness.  Musculoskeletal: He exhibits no edema.  Wound R lateral lower leg tender.  See picture  Lymphadenopathy:    He has no cervical  adenopathy.  Neurological: He is alert and oriented to person, place, and time.  Skin: Skin is warm and dry.  Psychiatric: He has a normal mood and affect. His behavior is normal.  Vitals reviewed.   Results for orders placed or performed during the hospital encounter of 07/20/18  Microalbumin, urine  Result Value Ref Range   Microalb, Ur 11.0 (H) Not Estab. ug/mL  Lipid panel  Result Value Ref Range   Cholesterol 191 0 - 200 mg/dL   Triglycerides 89 <150 mg/dL   HDL 52 >40 mg/dL   Total CHOL/HDL Ratio 3.7 RATIO   VLDL 18 0 - 40 mg/dL   LDL Cholesterol 121 (H) 0 - 99 mg/dL  Comprehensive metabolic panel  Result Value Ref Range   Sodium 137 135 - 145 mmol/L   Potassium 4.8 3.5 - 5.1 mmol/L   Chloride 100 98 - 111 mmol/L   CO2 30 22 - 32 mmol/L   Glucose, Bld 270 (H) 70 - 99 mg/dL   BUN 10 6 - 20 mg/dL   Creatinine, Ser 0.78 0.61 - 1.24 mg/dL   Calcium 9.2 8.9 - 10.3 mg/dL   Total Protein 7.7 6.5 - 8.1 g/dL   Albumin 3.5 3.5 - 5.0 g/dL   AST 18 15 - 41 U/L   ALT 22 0 - 44 U/L   Alkaline Phosphatase 99 38 - 126 U/L   Total Bilirubin 0.8 0.3 - 1.2 mg/dL   GFR calc non Af Amer >60 >60 mL/min   GFR calc Af Amer >60 >60 mL/min   Anion gap 7 5 - 15  Hemoglobin A1c  Result Value Ref Range   Hgb A1c MFr Bld 9.6 (H) 4.8 - 5.6 %   Mean Plasma Glucose 228.82 mg/dL  PSA  Result Value Ref Range   Prostatic Specific Antigen 0.25 0.00 - 4.00 ng/mL  CBC  Result Value Ref Range   WBC 12.4 (H) 4.0 - 10.5 K/uL   RBC 5.22 4.22 - 5.81 MIL/uL   Hemoglobin 14.1 13.0 - 17.0 g/dL   HCT 46.3 39.0 - 52.0 %   MCV 88.7 80.0 - 100.0 fL   MCH 27.0 26.0 - 34.0 pg   MCHC 30.5 30.0 - 36.0 g/dL   RDW 14.6 11.5 - 15.5 %   Platelets 268 150 - 400 K/uL   nRBC 0.0 0.0 - 0.2 %           Assessment & Plan:   Encounter Diagnoses  Name Primary?  [name] Uncontrolled type 2 diabetes mellitus with hyperglycemia ([place]) Yes  . Hyperlipidemia, unspecified hyperlipidemia type   . Essential hypertension     . 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 Morbid obesity ([place])   . BMI 60.0-69.9, adult ([place])   . Eating disorder, unspecified type     -reviewed labs with pt -pt to Stop iron -rx keflex for wound.  Pt counseled to look at the wound daily (use full length mirror or have a friend or family member take a picture with his phone).  He is to RTO for any worsening.  He is counseled to clean it daily with soap and water and avoid alcohol/peroxide.   -discussed with pt needed weight loss to help LE -pt started on lantus insulin 10units daily in addition to current medications.  Pt is taught how to give himself injections and he is given handouts.  Pt to monitor bs and call office for fbs < 70 or > 300 -pt to follow up 1 month with bs log.  RTO sooner prn

## 2018-08-08 ENCOUNTER — Ambulatory Visit: Payer: Self-pay | Admitting: Nutrition

## 2018-08-09 ENCOUNTER — Encounter: Payer: Self-pay | Admitting: Orthopaedic Surgery

## 2018-08-09 ENCOUNTER — Ambulatory Visit: Payer: Self-pay | Admitting: Orthopaedic Surgery

## 2018-08-09 VITALS — BP 169/99 | HR 96 | Ht 65.5 in | Wt >= 6400 oz

## 2018-08-09 DIAGNOSIS — Z6841 Body Mass Index (BMI) 40.0 and over, adult: Secondary | ICD-10-CM

## 2018-08-09 DIAGNOSIS — G8929 Other chronic pain: Secondary | ICD-10-CM

## 2018-08-09 DIAGNOSIS — M25561 Pain in right knee: Secondary | ICD-10-CM

## 2018-08-09 DIAGNOSIS — M25562 Pain in left knee: Secondary | ICD-10-CM

## 2018-08-09 MED ORDER — OXYCODONE-ACETAMINOPHEN 10-325 MG PO TABS: 1.0000 | ORAL_TABLET | ORAL | 0 refills | Status: DC | PRN

## 2018-08-09 NOTE — Progress Notes (Signed)
[name], male DOB:07-Aug-1966, 52 y.o. GGY:[id number]  Chief Complaint  [name] presents with  . Knee Pain    bilateral    HPI  [name] is a 52 y.o. male who has chronic knee pain that is much worse with the cold weather.  He has no giving way,no trauma.  He has not lost any weight.  He has no redness.   Body mass index is 66.7 kg/m.  The [name] meets the AMA guidelines for Morbid (severe) obesity with a BMI > 40.0 and I have recommended weight loss.   ROS  Review of Systems  Constitutional: Positive for activity change.  HENT: Negative for congestion.   Respiratory: Positive for shortness of breath. Negative for cough.   Cardiovascular: Positive for leg swelling. Negative for chest pain.  Endocrine: Negative for cold intolerance.  Musculoskeletal: Positive for arthralgias, back pain, gait problem, joint swelling and myalgias.  Allergic/Immunologic: Positive for environmental allergies.  Neurological: Negative for numbness.  All other systems reviewed and are negative.   All other systems reviewed and are negative.  The following is a summary of the past history medically, past history surgically, known current medicines, social history and family history.  This information is gathered electronically by the computer from prior information and documentation.  I review this each visit and have found including this information at this point in the chart is beneficial and informative.    Past Medical History:  Diagnosis Date  . Arthritis   . Diabetes mellitus without complication ([place])    diagnosed about age 13  . Gout   . Hypertension    board[place]line    Past Surgical History:  Procedure Laterality Date  . HERNIA REPAIR  2008  . MASS EXCISION Left age 58   L thigh- benign    Family History  Problem Relation Age of Onset  . Cancer Mother   . Diabetes Mother   . Heart failure Father   . Hypertension Father   . Diabetes Father     Social  History Social History   Tobacco Use  . Smoking status: Former Smoker    Packs/day: 0.50    Years: 5.00    Pack years: 2.50    Types: Cigarettes    Last attempt to quit: 1990    Years since quitting: 29.9  . Smokeless tobacco: Never Used  Substance Use Topics  . Alcohol use: No  . Drug use: No    No Known Allergies  Current Outpatient Medications  Medication Sig Dispense Refill  . cephALEXin (KEFLEX) 500 MG capsule Take 1 capsule (500 mg total) by mouth 3 (three) times daily. 21 capsule 0  . ferrous sulfate 325 (65 FE) MG EC tablet Take 1 tablet (325 mg total) 2 (two) times daily by mouth. ([name] taking differently: Take 325 mg by mouth daily. ) 60 tablet 0  . lisinopril (PRINIVIL,ZESTRIL) 20 MG tablet Take 1 tablet (20 mg total) by mouth daily. 90 tablet 1  . metFORMIN (GLUCOPHAGE) 1000 MG tablet Take 1 tablet (1,000 mg total) by mouth 2 (two) times daily with a meal. 180 tablet 1  . metoprolol tartrate (LOPRESSOR) 50 MG tablet Take 1 tablet (50 mg total) by mouth 2 (two) times daily. 60 tablet 1  . oxyCODONE-acetaminophen (PERCOCET) 10-325 MG tablet Take 1 tablet by mouth every 4 (four) hours as needed for pain. 170 tablet 0  . sitaGLIPtin (JANUVIA) 100 MG tablet Take 1 tablet (100 mg total) by mouth daily. [place]  tablet 1   No current facility-administered medications for this visit.      Physical Exam  Blood pressure (!) 169/99, pulse 96, height 5' 5.5" (1.664 m), weight (!) 407 lb (184.6 kg).  Constitutional: overall normal hygiene, normal nutrition, well developed, normal grooming, normal body habitus. Assistive device:none  Musculoskeletal: gait and station Limp none, muscle tone and strength are normal, no tremors or atrophy is present.  .  Neurological: coordination overall normal.  Deep tendon reflex/nerve stretch intact.  Sensation normal.  Cranial nerves II-XII intact.   Skin:   Normal overall no scars, lesions, ulcers or rashes. No psoriasis.  Psychiatric:  Alert and oriented x 3.  Recent memory intact, remote memory unclear.  Normal mood and affect. Well groomed.  Good eye contact.  Cardiovascular: overall no swelling, no varicosities, no edema bilaterally, normal temperatures of the legs and arms, no clubbing, cyanosis and good capillary refill.  Lymphatic: palpation is normal.  Both knees have effusion, ROM of 0 to 100, crepitus, NV intact.  He has a waddling type of gait.  All other systems reviewed and are negative   The [name] has been educated about the nature of the problem(s) and counseled on treatment options.  The [name] appeared to understand what I have discussed and is in agreement with it.  Encounter Diagnoses  Name Primary?  . Chronic pain of right knee Yes  . Chronic pain of left knee   . Morbid obesity due to excess calories ([place])   . Body mass index 60.0-69.9, adult [facility])     PLAN Call if any problems.  Precautions discussed.  Continue current medications.   Return to clinic 3 months  The [name] has read and signed an Opioid Treatment Agreement which has been scanned and added to the medical record.  The [name] understands the agreement and agrees to abide with it.  The [name] has chronic pain that is being treated with an opioid which relieves the pain.  The [name] understands potential complications with chronic opioid treatment.     I have reviewed the [name]'s 72-month prescription history in the [facility] CSRS prior to prescribing the narcotic given to the [name]. Electronically [place], MD 12/4/20192:17 PM .

## 2018-08-14 ENCOUNTER — Ambulatory Visit: Payer: Self-pay | Admitting: Physician Assistant

## 2018-08-15 ENCOUNTER — Encounter: Payer: Self-pay | Admitting: Physician Assistant

## 2018-08-15 ENCOUNTER — Ambulatory Visit: Payer: Self-pay | Admitting: Physician Assistant

## 2018-08-15 VITALS — BP 146/86 | HR 78 | Temp 97.5°F | Ht 65.5 in | Wt >= 6400 oz

## 2018-08-15 DIAGNOSIS — E1165 Type 2 diabetes mellitus with hyperglycemia: Secondary | ICD-10-CM

## 2018-08-15 DIAGNOSIS — Z9119 Patient's noncompliance with other medical treatment and regimen: Secondary | ICD-10-CM

## 2018-08-15 DIAGNOSIS — Z6841 Body Mass Index (BMI) 40.0 and over, adult: Secondary | ICD-10-CM

## 2018-08-15 DIAGNOSIS — E11622 Type 2 diabetes mellitus with other skin ulcer: Secondary | ICD-10-CM

## 2018-08-15 DIAGNOSIS — Z91199 Patient's noncompliance with other medical treatment and regimen due to unspecified reason: Secondary | ICD-10-CM

## 2018-08-15 DIAGNOSIS — L97909 Non-pressure chronic ulcer of unspecified part of unspecified lower leg with unspecified severity: Secondary | ICD-10-CM

## 2018-08-15 MED ORDER — CEPHALEXIN 500 MG PO CAPS: 500.0000 mg | ORAL_CAPSULE | Freq: Four times a day (QID) | ORAL | 0 refills | Status: DC

## 2018-08-15 NOTE — Progress Notes (Signed)
BP (!) 146/86 (BP Location: Left Arm, Patient Position: Sitting, Cuff Size: Large)   Pulse 78   Temp (!) 97.5 F (36.4 C)   Ht 5' 5.5" (1.664 m)   Wt (!) 403 lb 8 oz (183 kg)   SpO2 99%   BMI 66.12 kg/m    Subjective:    Patient ID: [name], male    DOB: Dec 05, 1965, 53 y.o.   MRN: [id number]  HPI: [name] is a 52 y.o. male presenting on 08/15/2018 for Diabetes and Ulcer   HPI   Pt was a no-show to his appointment yesterday but was put on the schedule to be seen today as a work-in due to the nature of his problem.    He didn't bring bs log.  He says it is Runs 214-262.   Pt says he knows that he has had no readings < 70 or > 300.   He stopped his [place] and metformin when he started his insulin but was supposed to continue the both.  Also, he ran out of his insulin several days ago and has been taking nothing for the bs.   Pt took his antibiotics for his ulcer but says the pain has not improved any.   Relevant past medical, surgical, family and social history reviewed and updated as indicated. Interim medical history since our last visit reviewed. Allergies and medications reviewed and updated.    Current Outpatient Medications:  .  lisinopril (PRINIVIL,ZESTRIL) 20 MG tablet, Take 1 tablet (20 mg total) by mouth daily., Disp: 90 tablet, Rfl: 1 .  metoprolol tartrate (LOPRESSOR) 50 MG tablet, Take 1 tablet (50 mg total) by mouth 2 (two) times daily., Disp: 60 tablet, Rfl: 1 .  oxyCODONE-acetaminophen (PERCOCET) 10-325 MG tablet, Take 1 tablet by mouth every 4 (four) hours as needed for pain., Disp: 170 tablet, Rfl: 0 .  ferrous sulfate 325 (65 FE) MG EC tablet, Take 1 tablet (325 mg total) 2 (two) times daily by mouth. (Patient not taking: Reported on 08/15/2018), Disp: 60 tablet, Rfl: 0 .  insulin glargine (LANTUS) 100 UNIT/ML injection, Inject 20 Units into the skin daily., Disp: , Rfl:  .  metFORMIN (GLUCOPHAGE) 1000 MG tablet, Take 1 tablet (1,000 mg total) by  mouth 2 (two) times daily with a meal. (Patient not taking: Reported on 08/15/2018), Disp: 180 tablet, Rfl: 1 .  sitaGLIPtin (JANUVIA) 100 MG tablet, Take 100 mg by mouth daily., Disp: , Rfl:   Review of Systems  Constitutional: Negative for appetite change, chills, diaphoresis, fatigue, fever and unexpected weight change.  HENT: Negative for congestion, dental problem, drooling, ear pain, facial swelling, hearing loss, mouth sores, sneezing, sore throat, trouble swallowing and voice change.   Eyes: Negative for pain, discharge, redness, itching and visual disturbance.  Respiratory: Negative for cough, choking, shortness of breath and wheezing.   Cardiovascular: Positive for leg swelling. Negative for chest pain and palpitations.  Gastrointestinal: Negative for abdominal pain, blood in stool, constipation, diarrhea and vomiting.  Endocrine: Negative for cold intolerance, heat intolerance and polydipsia.  Genitourinary: Negative for decreased urine volume, dysuria and hematuria.  Musculoskeletal: Positive for back pain. Negative for arthralgias and gait problem.  Skin: Negative for rash.  Allergic/Immunologic: Negative for environmental allergies.  Neurological: Negative for seizures, syncope, light-headedness and headaches.  Hematological: Negative for adenopathy.  Psychiatric/Behavioral: Negative for agitation, dysphoric mood and suicidal ideas. The patient is not nervous/anxious.     Per HPI unless specifically indicated above  Objective:    BP (!) 146/86 (BP Location: Left Arm, Patient Position: Sitting, Cuff Size: Large)   Pulse 78   Temp (!) 97.5 F (36.4 C)   Ht 5' 5.5" (1.664 m)   Wt (!) 403 lb 8 oz (183 kg)   SpO2 99%   BMI 66.12 kg/m   Wt Readings from Last 3 Encounters:  08/15/18 (!) 403 lb 8 oz (183 kg)  08/09/18 (!) 407 lb (184.6 kg)  07/24/18 (!) 410 lb (186 kg)    Physical Exam  Constitutional: He is oriented to person, place, and time. He is cooperative.   Morbidly obese with has trouble moving himself around due to his size  HENT:  Head: Normocephalic and atraumatic.  Pulmonary/Chest: Effort normal. No respiratory distress.  Musculoskeletal:  RLE ulcer on lateral aspect with purulent drainage.  No redness or odor.  + tenderness.  Neurological: He is alert and oriented to person, place, and time.  Nursing note and vitals reviewed.        Assessment & Plan:   Encounter Diagnoses  Name Primary?  [name] Uncontrolled type 2 diabetes mellitus with hyperglycemia ([place]) Yes  . Diabetic ulcer of lower leg ([place])   . Morbid obesity ([place])   . BMI 60.0-69.9, adult ([place])   . Personal history of noncompliance with medical treatment, presenting hazards to health     -pt counseled to Get back on januvia and metformin.  Continue lantus and avoid running out of the medication.  He is reminded to monitor bs and call office for fbs < 70 or > 300 -in light of worsening ulcer, will Refer to wound clinic.  Will prescribe another round of antibiotics -pt to follow up 3-4 wk with bs log.  RTO sooner prn worsening of the leg wound or other problem

## 2018-08-22 ENCOUNTER — Ambulatory Visit: Payer: Self-pay | Admitting: Physician Assistant

## 2018-09-04 ENCOUNTER — Encounter: Payer: Self-pay | Attending: Family Medicine | Admitting: Family Medicine

## 2018-09-04 ENCOUNTER — Other Ambulatory Visit
Admission: RE | Admit: 2018-09-04 | Discharge: 2018-09-04 | Disposition: A | Discharge status: Home / Self Care | Payer: Self-pay | Source: Ambulatory Visit | Attending: Family Medicine | Admitting: Family Medicine

## 2018-09-04 DIAGNOSIS — I872 Venous insufficiency (chronic) (peripheral): Secondary | ICD-10-CM | POA: Insufficient documentation

## 2018-09-04 DIAGNOSIS — Z6841 Body Mass Index (BMI) 40.0 and over, adult: Secondary | ICD-10-CM | POA: Insufficient documentation

## 2018-09-04 DIAGNOSIS — I10 Essential (primary) hypertension: Secondary | ICD-10-CM | POA: Insufficient documentation

## 2018-09-04 DIAGNOSIS — E11622 Type 2 diabetes mellitus with other skin ulcer: Secondary | ICD-10-CM | POA: Insufficient documentation

## 2018-09-04 DIAGNOSIS — M109 Gout, unspecified: Secondary | ICD-10-CM | POA: Insufficient documentation

## 2018-09-04 DIAGNOSIS — Z79891 Long term (current) use of opiate analgesic: Secondary | ICD-10-CM | POA: Insufficient documentation

## 2018-09-04 DIAGNOSIS — Z87891 Personal history of nicotine dependence: Secondary | ICD-10-CM | POA: Insufficient documentation

## 2018-09-04 DIAGNOSIS — L97812 Non-pressure chronic ulcer of other part of right lower leg with fat layer exposed: Secondary | ICD-10-CM | POA: Insufficient documentation

## 2018-09-04 DIAGNOSIS — Z79899 Other long term (current) drug therapy: Secondary | ICD-10-CM | POA: Insufficient documentation

## 2018-09-04 DIAGNOSIS — B999 Unspecified infectious disease: Secondary | ICD-10-CM | POA: Insufficient documentation

## 2018-09-06 NOTE — Progress Notes (Signed)
[name], [name] ([id number]) Visit Report for 09/04/2018 Chief Complaint Document Details Patient Name: [name], [name]. Date of Service: 09/04/2018 12:30 PM Medical Record Number: [id number] Patient Account Number: [id number] Date of Birth/Sex: March 26, 1966 (53 y.o. M) Treating RN: Harold Barban Primary Care Provider: [name] Other Clinician: Referring Provider: [name] Treating Provider/Extender: Beather Arbour Weeks in Treatment: 0 Information Obtained from: Patient Chief Complaint Right lower leg ulcer Electronic Signature(s) Signed: 09/04/2018 10:32:40 PM By: Beather Arbour FNP-C Entered By: Beather Arbour on 09/04/2018 16:29:07 Scherger, Jonette Eva ([id number]) -------------------------------------------------------------------------------- HPI Details Patient Name: [name]. Date of Service: 09/04/2018 12:30 PM Medical Record Number: [id number] Patient Account Number: [id number] Date of Birth/Sex: 09-Mar-1966 (53 y.o. M) Treating RN: Harold Barban Primary Care Provider: [name] Other Clinician: Referring Provider: [name] Treating Provider/Extender: Oneida Arenas in Treatment: 0 History of Present Illness HPI Description: 53 year old male with past medical history of diabetes type 2, HTN, obesity, former smoker, and chronic narcotic use. Present today for right lower leg ulcer. He states that he first noticed wound back in October 2019. At that time he felt that he was bitten by something. There was no OTC treatment. Over time the wound became larger with increased pain. He was seen by [facility] and started on Keflex antibiotics for cellulitis for 14 days. Finished abx 2-3 days ago. Chronic bilateral knee pain. HA1C in November 2019 was 9.6. He is independent of ADLs however, he is unable to reach the right leg wound area. Left ABI in clinic is 0.94, right is non-compressible. Electronic Signature(s) Signed: 09/04/2018 10:32:40  PM By: Beather Arbour FNP-C Entered By: Beather Arbour on 09/04/2018 16:37:59 Custodio, Jonette Eva ([id number]) -------------------------------------------------------------------------------- Physical Exam Details Patient Name: [name]. Date of Service: 09/04/2018 12:30 PM Medical Record Number: [id number] Patient Account Number: [id number] Date of Birth/Sex: [birth year] (53 y.o. M) Treating RN: Harold Barban Primary Care Provider: [name] Other Clinician: Referring Provider: [name] Treating Provider/Extender: Beather Arbour Weeks in Treatment: 0 Constitutional Alert and oriented x 3. appears in no distress. Eyes Conjunctivae clear. No discharge. Ears, Nose, Mouth, and Throat External ears and nose are within normal limits. Neck Neck supple and symmetrical.. Respiratory Respiratory effort is easy and symmetric bilaterally. Rate is normal at rest and on room air.. Cardiovascular Edema present in both extremities.. Gastrointestinal (GI) Abdomen is soft and non-distended without masses or tenderness.. Musculoskeletal Gait and station stable.. Integumentary (Hair, Skin) open wound right LE. Psychiatric No evidence of depression, anxiety, or agitation. Calm, cooperative, and communicative. Appropriate interactions and affect.. Notes Right lower leg wound with yellowish drainage and surrounding erythema. Wound is painful to touch. Culture obtained as there was not much improvement of wound with previous antibiotic therapy. Wound would benefit from debridement to remove adherent slough. Due to non palpable pulse/non-compressible and his tolerance with exam will not debride today. Referred to vascular specialist for further management of venous insufficiency. Electronic Signature(s) Signed: 09/04/2018 10:32:40 PM By: Beather Arbour FNP-C Entered By: Beather Arbour on 09/04/2018 17:14:20 Baillie, Jonette Eva  ([id number]) -------------------------------------------------------------------------------- Physician Orders Details Patient Name: [name]. Date of Service: 09/04/2018 12:30 PM Medical Record Number: [id number] Patient Account Number: [id number] Date of Birth/Sex: 27-Apr-1966 (53 y.o. M) Treating RN: Harold Barban Primary Care Provider: [name] Other Clinician: Referring Provider: [name] Treating Provider/Extender: Oneida Arenas in Treatment: 0 Verbal / Phone Orders: No Diagnosis Coding Wound Cleansing Wound #1 Right,Lateral Lower Leg o May Shower, gently pat wound dry  prior to applying new dressing. Anesthetic (add to Medication List) Wound #1 Right,Lateral Lower Leg o Topical Lidocaine 4% cream applied to wound bed prior to debridement (In Clinic Only). Primary Wound Dressing Wound #1 Right,Lateral Lower Leg o Iodoflex Secondary Dressing o Boardered Foam Dressing Dressing Change Frequency Wound #1 Right,Lateral Lower Leg o Change dressing every other day. Follow-up Appointments Wound #1 Right,Lateral Lower Leg o Return Appointment in 1 week. Consults o Vascular - Non compressible Right lower extremity, please evaluate for Venous/arterial insufficiency. Laboratory o Bacteria identified in Wound by Culture (MICRO) oooo LOINC Code: [id number] oooo Convenience Name: Wound culture routine Electronic Signature(s) Signed: 09/04/2018 10:32:40 PM By: Beather Arbour FNP-C Entered By: Beather Arbour on 09/04/2018 17:15:10 Garlitz, Jonette Eva ([id number]) -------------------------------------------------------------------------------- Problem List Details Patient Name: [name]. Date of Service: 09/04/2018 12:30 PM Medical Record Number: [id number] Patient Account Number: [id number] Date of Birth/Sex: 1966-07-16 (53 y.o. M) Treating RN: Harold Barban Primary Care Provider: [name] Other Clinician: Referring Provider: [name] Treating Provider/Extender: Oneida Arenas in Treatment: 0 Active Problems ICD-10 Evaluated Encounter Code Description Active Date Today Diagnosis E11.621 Type 2 diabetes mellitus with foot ulcer 09/04/2018 No Yes L97.812 Non-pressure chronic ulcer of other part of right lower leg 09/04/2018 No Yes with fat layer exposed [place] (primary) hypertension 09/04/2018 No Yes E66.01 Morbid (severe) obesity due to excess calories 09/04/2018 No Yes Inactive Problems Resolved Problems Electronic Signature(s) Signed: 09/04/2018 10:32:40 PM By: Beather Arbour FNP-C Entered By: Beather Arbour on 09/04/2018 16:28:25 Porche, Jonette Eva ([id number]) -------------------------------------------------------------------------------- Progress Note Details Patient Name: [name]. Date of Service: 09/04/2018 12:30 PM Medical Record Number: [id number] Patient Account Number: [id number] Date of Birth/Sex: Jan 23, 1966 (53 y.o. M) Treating RN: Harold Barban Primary Care Provider: [name] Other Clinician: Referring Provider: [name] Treating Provider/Extender: Oneida Arenas in Treatment: 0 Subjective Chief Complaint Information obtained from Patient Right lower leg ulcer History of Present Illness (HPI) 53 year old male with past medical history of diabetes type 2, HTN, obesity, former smoker, and chronic narcotic use. Present today for right lower leg ulcer. He states that he first noticed wound back in October 2019. At that time he felt that he was bitten by something. There was no OTC treatment. Over time the wound became larger with increased pain. He was seen by [facility] urgent care and started on Keflex antibiotics for cellulitis for 14 days. Finished abx 2-3 days ago. Chronic bilateral knee pain. HA1C in November 2019 was 9.6. He is independent of ADLs however, he is unable to reach the right leg wound area. Left ABI in clinic is 0.94, right is  non-compressible. Wound History Patient presents with 1 open wound that has been present for approximately 06/06/2018. Patient has been treating wound in the following manner: antibiotic(Keflex), soap and water. Laboratory tests have not been performed in the last month. Patient reportedly has not tested positive for an antibiotic resistant organism. Patient reportedly has not tested positive for osteomyelitis. Patient reportedly has not had testing performed to evaluate circulation in the legs. Patient experiences the following problems associated with their wounds: swelling. Patient History Information obtained from Patient. Allergies No Known Drug Allergies Social History Former smoker - started on 09/15/2002 - ended on 09/06/2012, Marital Status - Single, Alcohol Use - Never, Drug Use - No History, Caffeine Use - Daily - sodas. Medical History Cardiovascular Patient has history of Hypertension Endocrine Patient has history of Type II Diabetes - DX age 43A1C 9.6 Genitourinary Denies  history of End Stage Renal Disease Musculoskeletal Patient has history of Gout Oncologic Denies history of Received Chemotherapy, Received Radiation Patient is treated with Insulin, Oral Agents. Blood sugar is tested. Medical And Surgical History Notes CALLAGHAN, LAVERDURE ([id number]) Musculoskeletal Arthritis Review of Systems (ROS) Eyes The patient has no complaints or symptoms. Ear/Nose/Mouth/Throat The patient has no complaints or symptoms. Hematologic/Lymphatic The patient has no complaints or symptoms. Respiratory The patient has no complaints or symptoms. Cardiovascular Complains or has symptoms of LE edema. Gastrointestinal The patient has no complaints or symptoms. Endocrine The patient has no complaints or symptoms. Genitourinary The patient has no complaints or symptoms. Immunological Complains or has symptoms of Itching - Right leg. Integumentary (Skin) Complains or has symptoms of  Wounds - R lateral lower leg, Swelling - Bilateral legs. Musculoskeletal Complains or has symptoms of Muscle Pain - Right leg, bilateral knees. Denies complaints or symptoms of Muscle Weakness. Neurologic The patient has no complaints or symptoms. Oncologic The patient has no complaints or symptoms. Psychiatric The patient has no complaints or symptoms. Objective Constitutional Alert and oriented x 3. appears in no distress. Vitals Time Taken: 12:52 PM, Height: 68 in, Source: Stated, Weight: 389 lbs, Source: Stated, BMI: 59.1, Temperature: 97.8  F, Pulse: 84 bpm, Respiratory Rate: 18 breaths/min, Blood Pressure: 176/83 mmHg. Eyes Conjunctivae clear. No discharge. Ears, Nose, Mouth, and Throat External ears and nose are within normal limits. Neck Neck supple and symmetrical.. ARTIE, MCINTYRE ([id number]) Respiratory Respiratory effort is easy and symmetric bilaterally. Rate is normal at rest and on room air.. Cardiovascular Edema present in both extremities.. Gastrointestinal (GI) Abdomen is soft and non-distended without masses or tenderness.. Musculoskeletal Gait and station stable.[name] Psychiatric No evidence of depression, anxiety, or agitation. Calm, cooperative, and communicative. Appropriate interactions and affect.. General Notes: Right lower leg wound with yellowish drainage and surrounding erythema. Wound is painful to touch. Culture obtained as there was not much improvement of wound with previous antibiotic therapy. Wound would benefit from debridement to remove adherent slough. Due to non palpable pulse/non-compressible and his tolerance with exam will not debride today. Referred to vascular specialist for further management of venous insufficiency. Integumentary (Hair, Skin) open wound right LE. Wound #1 status is Open. Original cause of wound was Gradually Appeared. The wound is located on the Right,Lateral Lower Leg. The wound measures 2.5cm length x 2cm width x  0.1cm depth; 3.927cm^2 area and 0.393cm^3 volume. There is Fat Layer (Subcutaneous Tissue) Exposed exposed. There is no tunneling or undermining noted. There is a large amount of purulent drainage noted. The wound margin is distinct with the outline attached to the wound base. There is no granulation within the wound bed. There is a large (67-100%) amount of necrotic tissue within the wound bed including Eschar and Adherent Slough. The periwound skin appearance exhibited: Dry/Scaly, Hemosiderin Staining. The periwound skin appearance did not exhibit: Callus, Crepitus, Excoriation, Induration, Rash, Scarring, Maceration, Atrophie Blanche, Cyanosis, Ecchymosis, Mottled, Pallor, Rubor, Erythema. Assessment Active Problems ICD-10 Type 2 diabetes mellitus with foot ulcer Non-pressure chronic ulcer of other part of right lower leg with fat layer exposed [place] (primary) hypertension Morbid (severe) obesity due to excess calories Plan Wound Cleansing: Wound #1 Right,Lateral Lower Leg: May Shower, gently pat wound dry prior to applying new dressing. Anesthetic (add to Medication List): LOUI, [place]. ([id number]) Wound #1 Right,Lateral Lower Leg: Topical Lidocaine 4% cream applied to wound bed prior to debridement (In Clinic Only). Primary Wound Dressing: Wound #1 Right,Lateral Lower Leg: Iodoflex  Secondary Dressing: Boardered Foam Dressing Dressing Change Frequency: Wound #1 Right,Lateral Lower Leg: Change dressing every other day. Follow-up Appointments: Wound #1 Right,Lateral Lower Leg: Return Appointment in 1 week. Laboratory ordered were: Wound culture routine Consults ordered were: Vascular - Non compressible Right lower extremity, please evaluate for Venous/arterial insufficiency. Electronic Signature(s) Signed: 09/04/2018 10:32:40 PM By: Beather Arbour FNP-C Entered By: Beather Arbour on 09/04/2018 17:15:33 Minner, Jonette Eva  ([id number]) -------------------------------------------------------------------------------- ROS/PFSH Details Patient Name: [name]. Date of Service: 09/04/2018 12:30 PM Medical Record Number: [id number] Patient Account Number: [id number] Date of Birth/Sex: Nov 27, 1965 (53 y.o. M) Treating RN: Harold Barban Primary Care Provider: [name] Other Clinician: Referring Provider: [name] Treating Provider/Extender: Oneida Arenas in Treatment: 0 Information Obtained From Patient Wound History Do you currently have one or more open woundso Yes How many open wounds do you currently haveo 1 Approximately how long have you had your woundso 06/06/2018 How have you been treating your wound(s) until nowo antibiotic(Keflex), soap and water Has your wound(s) ever healed and then re-openedo No Have you had any lab work done in the past montho No Have you tested positive for an antibiotic resistant organism (MRSA, VRE)o No Have you tested positive for osteomyelitis (bone infection)o No Have you had any tests for circulation on your legso No Have you had other problems associated with your woundso Swelling Cardiovascular Complaints and Symptoms: Positive for: LE edema Medical History: Positive for: Hypertension Immunological Complaints and Symptoms: Positive for: Itching - Right leg Integumentary (Skin) Complaints and Symptoms: Positive for: Wounds - R lateral lower leg; Swelling - Bilateral legs Musculoskeletal Complaints and Symptoms: Positive for: Muscle Pain - Right leg, bilateral knees Negative for: Muscle Weakness Medical History: Positive for: Gout Past Medical History Notes: Arthritis Eyes Complaints and Symptoms: No Complaints or Symptoms Scheerer, Josua Z. ([id number]) Ear/Nose/Mouth/Throat Complaints and Symptoms: No Complaints or Symptoms Hematologic/Lymphatic Complaints and Symptoms: No Complaints or Symptoms Respiratory Complaints and  Symptoms: No Complaints or Symptoms Gastrointestinal Complaints and Symptoms: No Complaints or Symptoms Endocrine Complaints and Symptoms: No Complaints or Symptoms Medical History: Positive for: Type II Diabetes - DX age 57A1C 9.6 Time with diabetes: 3 years Treated with: Insulin, Oral agents Blood sugar tested every day: Yes Tested : Genitourinary Complaints and Symptoms: No Complaints or Symptoms Medical History: Negative for: End Stage Renal Disease Neurologic Complaints and Symptoms: No Complaints or Symptoms Oncologic Complaints and Symptoms: No Complaints or Symptoms Medical History: Negative for: Received Chemotherapy; Received Radiation Psychiatric Complaints and Symptoms: No Complaints or Symptoms Immunizations Pneumococcal Vaccine: TAMEL, ABEL ([id number]) Received Pneumococcal Vaccination: No Implantable Devices Family and Social History Former smoker - started on 09/15/2002 - ended on 09/06/2012; Marital Status - Single; Alcohol Use: Never; Drug Use: No History; Caffeine Use: Daily - sodas; Financial Concerns: No; Food, Clothing or Shelter Needs: No; Support System Lacking: No; Transportation Concerns: No; Advanced Directives: No; Living Will: No; Medical Power of Attorney: No Electronic Signature(s) Signed: 09/04/2018 10:32:40 PM By: Beather Arbour FNP-C Signed: 09/05/2018 4:38:01 PM By: Harold Barban Entered By: Harold Barban on 09/04/2018 13:07:09 Yeagle, Jonette Eva ([id number]) -------------------------------------------------------------------------------- SuperBill Details Patient Name: [name]. Date of Service: 09/04/2018 Medical Record Number: [id number] Patient Account Number: [id number] Date of Birth/Sex: [birth year] (53 y.o. M) Treating RN: Harold Barban Primary Care Provider: [name] Other Clinician: Referring Provider: [name] Treating Provider/Extender: Beather Arbour Weeks in Treatment: 0 Diagnosis Coding ICD-10  Codes Code Description E11.621 Type 2 diabetes mellitus with foot ulcer L97.812 Non-pressure chronic ulcer  of other part of right lower leg with fat layer exposed [place] (primary) hypertension E66.01 Morbid (severe) obesity due to excess calories Facility Procedures CPT4 Code: [id number] Description: 99213 - WOUND CARE VISIT-LEV 3 EST PT Modifier: Quantity: 1 Physician Procedures CPT4 Code Description: [id number] - WC PHYS LEVEL 2 - NEW PT ICD-10 Diagnosis Description O12.248 Non-pressure chronic ulcer of other part of right lower leg wi Modifier: th fat layer expo Quantity: 1 sed Electronic Signature(s) Signed: 09/04/2018 10:32:40 PM By: Beather Arbour FNP-C Entered By: Beather Arbour on 09/04/2018 17:15:59

## 2018-09-06 NOTE — Progress Notes (Signed)
KNOWLEDGE, ESCANDON ([id number]) Visit Report for 09/04/2018 Allergy List Details Patient Name: [name], [name]. Date of Service: 09/04/2018 12:30 PM Medical Record Number: [id number] Patient Account Number: [id number] Date of Birth/Sex: November 02, 1965 (53 y.o. M) Treating RN: [name] Primary Care [name]: [name] Other Clinician: Referring Prim Morace: [name] Treating [name]/Extender: [name]: 0 Allergies Active Allergies No Known Drug Allergies Allergy Notes Electronic Signature(s) Signed: 09/05/2018 4:38:01 PM By: [name] Entered By: [name] on 09/04/2018 12:57:01 [name], [name] ([id number]) -------------------------------------------------------------------------------- Arrival Information Details Patient Name: [name]. Date of Service: 09/04/2018 12:30 PM Medical Record Number: [id number] Patient Account Number: [id number] Date of Birth/Sex: 10-14-1965 (53 y.o. M) Treating RN: [name] Primary Care [name]: [name] Other Clinician: Referring Aasir Daigler: [name] Treating [name]/Extender: [name] in [name]: 0 Visit Information Patient Arrived: [name] Arrival Time: 12:51 Accompanied By: self Transfer Assistance: None Patient Identification Verified: Yes Secondary Verification Process Completed: Yes Electronic Signature(s) Signed: 09/04/2018 3:55:25 PM By: [name] RCP, RRT, CHT Entered By: [name] on 09/04/2018 12:51:40 [name], [name] ([id number]) -------------------------------------------------------------------------------- Clinic Level of Care Assessment Details Patient Name: [name]. Date of Service: 09/04/2018 12:30 PM Medical Record Number: [id number] Patient Account Number: [id number] Date of Birth/Sex: 06/19/1966 (52 y.o. M) Treating RN: [name] Primary Care Jahlani Lorentz: [name] Other Clinician: Referring  [name]: [name] Treating [name]/Extender: [name] in [name]: 0 Clinic Level of Care Assessment Items TOOL 4 Quantity Score []  - Use when only an EandM is performed on FOLLOW-UP visit 0 ASSESSMENTS - Nursing Assessment / Reassessment X - Reassessment of Co-morbidities (includes updates in patient status) 1 10 X- 1 5 Reassessment of Adherence to [name] Plan ASSESSMENTS - Wound and Skin Assessment / Reassessment X - Simple Wound Assessment / Reassessment - one wound 1 5 []  - 0 Complex Wound Assessment / Reassessment - multiple wounds []  - 0 Dermatologic / Skin Assessment (not related to wound area) ASSESSMENTS - Focused Assessment []  - Circumferential Edema Measurements - multi extremities 0 []  - 0 Nutritional Assessment / Counseling / Intervention []  - 0 Lower Extremity Assessment (monofilament, tuning fork, pulses) []  - 0 Peripheral Arterial Disease Assessment (using hand held doppler) ASSESSMENTS - Ostomy and/or Continence Assessment and Care []  - Incontinence Assessment and Management 0 []  - 0 Ostomy Care Assessment and Management (repouching, etc.) PROCESS - Coordination of Care X - Simple Patient / Family Education for ongoing care 1 15 []  - 0 Complex (extensive) Patient / Family Education for ongoing care X- 1 10 Staff obtains Programmer, systems, Records, Test Results / Process Orders []  - 0 Staff telephones HHA, Nursing Homes / Clarify orders / etc []  - 0 Routine Transfer to another Facility (non-emergent condition) []  - 0 Routine Hospital Admission (non-emergent condition) X- 1 15 New Admissions / Biomedical engineer / Ordering NPWT, Apligraf, etc. []  - 0 Emergency Hospital Admission (emergent condition) X- 1 10 Simple Discharge Coordination [name]. ([id number]) []  - 0 Complex (extensive) Discharge Coordination PROCESS - Special Needs []  - Pediatric / Minor Patient Management 0 []  - 0 Isolation Patient Management []  -  0 Hearing / Language / Visual special needs []  - 0 Assessment of Community assistance (transportation, D/C planning, etc.) []  - 0 Additional assistance / Altered mentation []  - 0 Support Surface(s) Assessment (bed, cushion, seat, etc.) INTERVENTIONS - Wound Cleansing / Measurement X - Simple Wound Cleansing - one wound 1 5 []  - 0 Complex Wound Cleansing - multiple  wounds X- 1 5 Wound Imaging (photographs - any number of wounds) []  - 0 Wound Tracing (instead of photographs) X- 1 5 Simple Wound Measurement - one wound []  - 0 Complex Wound Measurement - multiple wounds INTERVENTIONS - Wound Dressings X - Small Wound Dressing one or multiple wounds 1 10 []  - 0 Medium Wound Dressing one or multiple wounds []  - 0 Large Wound Dressing one or multiple wounds []  - 0 Application of Medications - topical []  - 0 Application of Medications - injection INTERVENTIONS - Miscellaneous []  - External ear exam 0 []  - 0 Specimen Collection (cultures, biopsies, blood, body fluids, etc.) []  - 0 Specimen(s) / Culture(s) sent or taken to Lab for analysis []  - 0 Patient Transfer (multiple staff / Civil Service fast streamer / Similar devices) []  - 0 Simple Staple / Suture removal (25 or less) []  - 0 Complex Staple / Suture removal (26 or more) []  - 0 Hypo / Hyperglycemic Management (close monitor of Blood Glucose) []  - 0 Ankle / Brachial Index (ABI) - do not check if billed separately X- 1 5 Vital Signs [name], [name] ([id number]) Has the patient been seen at the hospital within the last three years: Yes Total Score: 100 Level Of Care: New/Established - Level 3 Electronic Signature(s) Signed: 09/05/2018 4:38:01 PM By: [name] Entered By: [name] on 09/04/2018 13:57:25 [name], [name] ([id number]) -------------------------------------------------------------------------------- Encounter Discharge Information Details Patient Name: [name]. Date of Service: 09/04/2018 12:30 PM Medical  Record Number: [id number] Patient Account Number: [id number] Date of Birth/Sex: Feb 05, 1966 (53 y.o. M) Treating RN: [name] Primary Care Ashawna Hanback: [name] Other Clinician: Referring Majesti Gambrell: [name] Treating Yolonda Purtle/Extender: [name] in [name]: 0 Encounter Discharge Information Items Discharge Condition: Stable Ambulatory Status: Cane Discharge Destination: Home Transportation: Private Auto Accompanied By: friend Schedule Follow-up Appointment: Yes Clinical Summary of Care: Electronic Signature(s) Signed: 09/05/2018 4:38:01 PM By: [name] Entered By: [name] on 09/04/2018 14:13:34 Melander, [name] ([id number]) -------------------------------------------------------------------------------- Lower Extremity Assessment Details Patient Name: [name]. Date of Service: 09/04/2018 12:30 PM Medical Record Number: [id number] Patient Account Number: [id number] Date of Birth/Sex: 1966/01/19 (53 y.o. M) Treating RN: [name] Primary Care Luisenrique Conran: [name] Other Clinician: Referring Titania Gault: [name] Treating Baldo Hufnagle/Extender: [name]: 0 Edema Assessment Assessed: [Left: No] [Right: No] Edema: [Left: Yes] [Right: Yes] Calf Left: Right: Point of Measurement: 35 cm From Medial Instep 49 cm 49.5 cm Ankle Left: Right: Point of Measurement: 12 cm From Medial Instep 29.2 cm 32 cm Vascular Assessment Pulses: Dorsalis Pedis Palpable: [Left:Yes] [Right:Yes] Posterior Tibial Palpable: [Left:Yes] [Right:No] Extremity colors, hair growth, and conditions: Extremity Color: [Left:Hyperpigmented] [Right:Red] Temperature of Extremity: [Left:Warm] [Right:Warm] Capillary Refill: [Left:< 3 seconds] [Right:< 3 seconds] Blood Pressure: Brachial: [Left:176] [Right:176] Dorsalis Pedis: 160 [Left:Dorsalis Pedis:] Ankle: Posterior Tibial: 165 [Left:Posterior Tibial: 0.94] Toe Nail  Assessment Left: Right: Thick: Yes Yes Discolored: Yes Yes Deformed: Yes Yes Improper Length and Hygiene: Yes Yes Electronic Signature(s) Signed: 09/05/2018 4:38:01 PM By: [name] Entered By: [name] on 09/04/2018 13:23:42 Wilczak, [name] ([id number]) -------------------------------------------------------------------------------- Multi Wound Chart Details Patient Name: [name]. Date of Service: 09/04/2018 12:30 PM Medical Record Number: [id number] Patient Account Number: [id number] Date of Birth/Sex: 1966/07/23 (53 y.o. M) Treating RN: [name] Primary Care Kindsey Eblin: [name] Other Clinician: Referring Alasdair Kleve: [name] Treating Whittaker Lenis/Extender: [name]: 0 Vital Signs Height(in): 68 Pulse(bpm): 84 Weight(lbs): 389 Blood Pressure(mmHg): 176/83 Body Mass Index(BMI): 59 Temperature(F): 97.8  Respiratory Rate 18 (breaths/min): Photos: [1:No Photos] [N/A:N/A] Wound Location: [1:Right Lower Leg - Lateral] [N/A:N/A] Wounding Event: [1:Gradually Appeared] [N/A:N/A] Primary Etiology: [1:Diabetic Wound/Ulcer of the Lower Extremity] [N/A:N/A] Comorbid History: [1:Hypertension, Type II Diabetes, Gout] [N/A:N/A] Date Acquired: [1:06/06/2018] [N/A:N/A] Weeks of [name]: [1:0] [N/A:N/A] Wound Status: [1:Open] [N/A:N/A] Measurements L x W x D [1:2.5x2x0.1] [N/A:N/A] (cm) Area (cm) : [1:3.927] [N/A:N/A] Volume (cm) : [1:0.393] [N/A:N/A] Classification: [1:Grade 2] [N/A:N/A] Exudate Amount: [1:Large] [N/A:N/A] Exudate Type: [1:Purulent] [N/A:N/A] Exudate Color: [1:yellow, brown, green] [N/A:N/A] Wound Margin: [1:Distinct, outline attached] [N/A:N/A] Granulation Amount: [1:None Present (0%)] [N/A:N/A] Necrotic Amount: [1:Large (67-100%)] [N/A:N/A] Necrotic Tissue: [1:Eschar, Adherent Slough] [N/A:N/A] Exposed Structures: [1:Fat Layer (Subcutaneous Tissue) Exposed: Yes Fascia: No Tendon: No Muscle: No Joint:  No Bone: No] [N/A:N/A] Epithelialization: [1:None] [N/A:N/A] Periwound Skin Texture: [1:Excoriation: No Induration: No Callus: No Crepitus: No Rash: No Scarring: No] [N/A:N/A] Periwound Skin Moisture: [N/A:N/A] Dry/Scaly: Yes Maceration: No Periwound Skin Color: Hemosiderin Staining: Yes N/A N/A Atrophie Blanche: No Cyanosis: No Ecchymosis: No Erythema: No Mottled: No Pallor: No Rubor: No Tenderness on Palpation: No N/A N/A Wound Preparation: Ulcer Cleansing: N/A N/A Rinsed/Irrigated with Saline Topical Anesthetic Applied: Other: lidocaine 4% [name] Notes Wound #1 (Right, Lateral Lower Leg) Notes iodoflex, BFD Electronic Signature(s) Signed: 09/04/2018 10:32:40 PM By: [name] Arbour FNP-C Entered By: [name] Arbour on 09/04/2018 16:28:45 Pung, [name] ([id number]) -------------------------------------------------------------------------------- [place] Details Patient Name: [name]. Date of Service: 09/04/2018 12:30 PM Medical Record Number: [id number] Patient Account Number: [id number] Date of Birth/Sex: 1965-11-14 (53 y.o. M) Treating RN: [name] Primary Care Sandee Bernath: [name] Other Clinician: Referring Rajni Holsworth: [name] Treating Nazia Rhines/Extender: [name]: 0 Active Inactive Venous Leg Ulcer Nursing Diagnoses: Knowledge deficit related to disease process and management Goals: Patient will maintain optimal edema control Date Initiated: 09/04/2018 Target Resolution Date: 10/05/2018 Goal Status: Active Patient/caregiver will verbalize understanding of disease process and disease management Date Initiated: 09/04/2018 Target Resolution Date: 10/05/2018 Goal Status: Active Interventions: Provide education on venous insufficiency Notes: Wound/Skin Impairment Nursing Diagnoses: Impaired tissue integrity Goals: Ulcer/skin breakdown will have a volume reduction of 30% by week 4 Date  Initiated: 09/04/2018 Target Resolution Date: 10/05/2018 Goal Status: Active Interventions: Assess patient/caregiver ability to obtain necessary supplies Assess patient/caregiver ability to perform ulcer/skin care regimen upon admission and as needed Notes: Electronic Signature(s) Signed: 09/05/2018 4:38:01 PM By: [name] Entered By: [name] on 09/04/2018 13:50:00 Keeler, [name] ([id number]) -------------------------------------------------------------------------------- Pain Assessment Details Patient Name: [name]. Date of Service: 09/04/2018 12:30 PM Medical Record Number: [id number] Patient Account Number: [id number] Date of Birth/Sex: October 21, 1965 (53 y.o. M) Treating RN: [name] Primary Care Jewell Haught: [name] Other Clinician: Referring Lanasia Porras: [name] Treating Hyacinth Marcelli/Extender: [name] in [name]: 0 Active Problems Location of Pain Severity and Description of Pain Patient Has Paino Yes Site Locations Rate the pain. Current Pain Level: 10 Pain Management and Medication Current Pain Management: Electronic Signature(s) Signed: 09/04/2018 3:55:25 PM By: [name] RCP, RRT, CHT Signed: 09/05/2018 4:38:01 PM By: [name] Entered By: [name] on 09/04/2018 12:51:56 Breth, [name] ([id number]) -------------------------------------------------------------------------------- Patient/Caregiver Education Details Patient Name: [name]. Date of Service: 09/04/2018 12:30 PM Medical Record Number: [id number] Patient Account Number: [id number] Date of Birth/Gender: [birth year] (53 y.o. M) Treating RN: [name] Primary Care Physician: [name] Other Clinician: Referring Physician: [name] Treating Physician/Extender: [name] in [name]: 0 Education Assessment Education Provided To: Patient Education Topics Provided Venous: Handouts:  Controlling Swelling with Compression Stockings Methods: Demonstration, Explain/Verbal Responses: State content correctly Welcome [facility]: Handouts: Welcome [facility] Methods: Demonstration, Explain/Verbal Responses: State content correctly Electronic Signature(s) Signed: 09/05/2018 4:38:01 PM By: [name] Entered By: [name] on 09/04/2018 14:13:38 Loor, [name] ([id number]) -------------------------------------------------------------------------------- Wound Assessment Details Patient Name: [name]. Date of Service: 09/04/2018 12:30 PM Medical Record Number: [id number] Patient Account Number: [id number] Date of Birth/Sex: [birth year] (53 y.o. M) Treating RN: [name] Primary Care Tysean Vand[place]et: [name] Other Clinician: Referring Chirsty Armistead: [name] Treating Daysia Vandenboom/Extender: [name]: 0 Wound Status Wound Number: 1 Primary Etiology: Diabetic Wound/Ulcer of the Lower Extremity Wound Location: Right Lower Leg - Lateral Wound Status: Open Wounding Event: Gradually Appeared Comorbid Hypertension, Type II Diabetes, Gout Date Acquired: 06/06/2018 History: Weeks Of [name]: 0 Clustered Wound: No Photos Photo Uploaded By: [name] on 09/05/2018 07:51:32 Wound Measurements Length: (cm) 2.5 Width: (cm) 2 Depth: (cm) 0.1 Area: (cm) 3.927 Volume: (cm) 0.393 % Reduction in Area: % Reduction in Volume: Epithelialization: None Tunneling: No Undermining: No Wound Description Classification: Grade 2 Wound Margin: Distinct, outline attached Exudate Amount: Large Exudate Type: Purulent Exudate Color: yellow, brown, green Foul Odor After Cleansing: No Slough/Fibrino Yes Wound Bed Granulation Amount: None Present (0%) Exposed Structure Necrotic Amount: Large (67-100%) Fascia Exposed: No Necrotic Quality: Eschar, Adherent Slough Fat Layer (Subcutaneous Tissue) Exposed:  Yes Tendon Exposed: No Muscle Exposed: No Joint Exposed: No Bone Exposed: No Periwound Skin Texture Drury, Halston Z. ([id number]) Texture Color No Abnormalities Noted: No No Abnormalities Noted: No Callus: No Atrophie Blanche: No Crepitus: No Cyanosis: No Excoriation: No Ecchymosis: No Induration: No Erythema: No Rash: No Hemosiderin Staining: Yes Scarring: No Mottled: No Pallor: No Moisture Rubor: No No Abnormalities Noted: No Dry / Scaly: Yes Maceration: No Wound Preparation Ulcer Cleansing: Rinsed/Irrigated with Saline Topical Anesthetic Applied: Other: lidocaine 4%, [name] Notes Wound #1 (Right, Lateral Lower Leg) Notes iodoflex, BFD Electronic Signature(s) Signed: 09/05/2018 4:38:01 PM By: [name] Entered By: [name] on 09/04/2018 13:26:23 Kugel, [name] ([id number]) -------------------------------------------------------------------------------- Vitals Details Patient Name: [name]. Date of Service: 09/04/2018 12:30 PM Medical Record Number: [id number] Patient Account Number: [id number] Date of Birth/Sex: [birth year] (53 y.o. M) Treating RN: [name] Primary Care Arielis Leonhart: [name] Other Clinician: Referring Kyrielle Urbanski: [name] Treating Phillippe Orlick/Extender: [name]: 0 Vital Signs Time Taken: 12:52 Temperature (F): 97.8 Height (in): 68 Pulse (bpm): 84 Source: Stated Respiratory Rate (breaths/min): 18 Weight (lbs): 389 Blood Pressure (mmHg): 176/83 Source: Stated Reference Range: 80 - 120 mg / dl Body Mass Index (BMI): 59.1 Electronic Signature(s) Signed: 09/04/2018 3:55:25 PM By: [name] RCP, RRT, CHT Entered By: [name] on 09/04/2018 12:55:36

## 2018-09-06 NOTE — Progress Notes (Signed)
[name] ([id number]) Visit Report for 09/04/2018 Abuse/Suicide Risk Screen Details Patient Name: [name], [name]. Date of Service: 09/04/2018 12:30 PM Medical Record Number: [id number] Patient Account Number: [id number] Date of Birth/Sex: Oct 08, 1965 (53 y.o. M) Treating RN: [name] Primary Care [name]: [name] Other Clinician: Referring Airyn Ellzey: [name] Treating Zabella Wease/Extender: [name] in Treatment: 0 Abuse/Suicide Risk Screen Items Answer ABUSE/SUICIDE RISK SCREEN: Has anyone close to you tried to hurt or harm you recentlyo No Do you feel uncomfortable with anyone in your familyo No Has anyone forced you do things that you didnot want to doo No Do you have any thoughts of harming yourselfo No Patient displays signs or symptoms of abuse and/or neglect. No Electronic Signature(s) Signed: 09/05/2018 4:38:01 PM By: [name] Entered By: [name] on 09/04/2018 13:07:23 [name] ([id number]) -------------------------------------------------------------------------------- Activities of Daily Living Details Patient Name: [name]. Date of Service: 09/04/2018 12:30 PM Medical Record Number: [id number] Patient Account Number: [id number] Date of Birth/Sex: Dec 31, 1965 (53 y.o. M) Treating RN: [name] Primary Care Thinh Cuccaro: [name] Other Clinician: Referring Heyden Jaber: [name] Treating [name]/Extender: [name] in Treatment: 0 Activities of Daily Living Items Answer Activities of Daily Living (Please select one for each item) Drive Automobile Completely Able Take Medications Completely Able Use Telephone Completely Able Care for Appearance Completely Able Use Toilet Completely Able Bath / Shower Completely Able Dress Self Completely Able Feed Self Completely Able Walk Completely Able Get In / Out Bed Completely Able Housework Completely Able Prepare Meals Completely  Able Handle Money Completely Able Shop for Self Completely Able Electronic Signature(s) Signed: 09/05/2018 4:38:01 PM By: [name] Entered By: [name] on 09/04/2018 13:07:34 [name] ([id number]) -------------------------------------------------------------------------------- Education Assessment Details Patient Name: [name]. Date of Service: 09/04/2018 12:30 PM Medical Record Number: [id number] Patient Account Number: [id number] Date of Birth/Sex: 11/25/1965 (53 y.o. M) Treating RN: [name] Primary Care Maricella Filyaw: [name] Other Clinician: Referring [name]: [name] Treating [name]/Extender: [name] in Treatment: 0 Primary Learner Assessed: Patient Learning Preferences/Education Level/Primary Language Learning Preference: Explanation Highest Education Level: College or Above Cognitive Barrier Assessment/Beliefs Language Barrier: No Translator Needed: No Memory Deficit: No Emotional Barrier: No Cultural/Religious Beliefs Affecting Medical Care: No Physical Barrier Assessment Impaired Vision: No Impaired Hearing: No Decreased Hand dexterity: No Knowledge/Comprehension Assessment Knowledge Level: High Comprehension Level: High Ability to understand written High instructions: Ability to understand verbal High instructions: Motivation Assessment Anxiety Level: Calm Cooperation: Cooperative Education Importance: Acknowledges Need Interest in Health Problems: Asks Questions Perception: Coherent Willingness to Engage in Self- High Management Activities: Readiness to Engage in Self- High Management Activities: Electronic Signature(s) Signed: 09/05/2018 4:38:01 PM By: [name] Entered By: [name] on 09/04/2018 13:08:02 [name] ([id number]) -------------------------------------------------------------------------------- Fall Risk Assessment Details Patient Name: [name]. Date of  Service: 09/04/2018 12:30 PM Medical Record Number: [id number] Patient Account Number: [id number] Date of Birth/Sex: December 27, 1965 (53 y.o. M) Treating RN: [name] Primary Care Olyvia Gopal: [name] Other Clinician: Referring [name]: [name] Treating [name]/Extender: [name] in Treatment: 0 Fall Risk Assessment Items Have you had 2 or more falls in the last 12 monthso 0 No Have you had any fall that resulted in injury in the last 12 monthso 0 No FALL RISK ASSESSMENT: History of falling - immediate or within 3 months 0 No Secondary diagnosis 0 No Ambulatory aid None/bed rest/wheelchair/nurse 0 No Crutches/cane/walker 15 Yes Furniture 0 No IV Access/Saline Lock 0 No Gait/Training  Normal/bed rest/immobile 0 No Weak 0 No Impaired 0 No Mental Status Oriented to own ability 0 Yes Electronic Signature(s) Signed: 09/05/2018 4:38:01 PM By: [name] Entered By: [name] on 09/04/2018 13:08:40 [name] ([id number]) -------------------------------------------------------------------------------- Foot Assessment Details Patient Name: [name]. Date of Service: 09/04/2018 12:30 PM Medical Record Number: [id number] Patient Account Number: [id number] Date of Birth/Sex: 1966/04/10 (53 y.o. M) Treating RN: [name] Primary Care [name]: [name] Other Clinician: Referring [name]: [name] Treating [name]/Extender: [name] Weeks in Treatment: 0 Foot Assessment Items Site Locations + = Sensation present, - = Sensation absent, C = Callus, U = Ulcer R = Redness, W = Warmth, M = Maceration, PU = Pre-ulcerative lesion F = Fissure, S = Swelling, D = Dryness Assessment Right: Left: Other Deformity: No No Prior Foot Ulcer: No No Prior Amputation: No No Charcot Joint: No No Ambulatory Status: Ambulatory With Help Assistance Device: Cane Gait: Buyer, retail Signature(s) Signed: 09/05/2018  4:38:01 PM By: [name] Entered By: [name] on 09/04/2018 13:11:59 [name] ([id number]) -------------------------------------------------------------------------------- Nutrition Risk Assessment Details Patient Name: [name]. Date of Service: 09/04/2018 12:30 PM Medical Record Number: [id number] Patient Account Number: [id number] Date of Birth/Sex: 1965-12-08 (53 y.o. M) Treating RN: [name] Primary Care Orva Riles: [name] Other Clinician: Referring Jamarco Zaldivar: [name] Treating Lawrence Roldan/Extender: [name] Weeks in Treatment: 0 Height (in): 68 Weight (lbs): 389 Body Mass Index (BMI): 59.1 Nutrition Risk Assessment Items NUTRITION RISK SCREEN: I have an illness or condition that made me change the kind and/or amount of 0 No food I eat I eat fewer than two meals per day 0 No I eat few fruits and vegetables, or milk products 0 No I have three or more drinks of beer, liquor or wine almost every day 0 No I have tooth or mouth problems that make it hard for me to eat 0 No I don't always have enough money to buy the food I need 0 No I eat alone most of the time 1 Yes I take three or more different prescribed or over-the-counter drugs a day 1 Yes Without wanting to, I have lost or gained 10 pounds in the last six months 0 No I am not always physically able to shop, cook and/or feed myself 0 No Nutrition Protocols Good Risk Protocol Moderate Risk Protocol Electronic Signature(s) Signed: 09/05/2018 4:38:01 PM By: [name] Entered By: [name] on 09/04/2018 13:09:11

## 2018-09-07 LAB — AEROBIC CULTURE  (SUPERFICIAL SPECIMEN)

## 2018-09-07 LAB — AEROBIC CULTURE W GRAM STAIN (SUPERFICIAL SPECIMEN): Gram Stain: NONE SEEN

## 2018-09-11 ENCOUNTER — Telehealth: Payer: Self-pay | Admitting: Orthopaedic Surgery

## 2018-09-11 ENCOUNTER — Encounter: Payer: Self-pay | Attending: Physician Assistant | Admitting: Physician Assistant

## 2018-09-11 DIAGNOSIS — M25561 Pain in right knee: Secondary | ICD-10-CM | POA: Insufficient documentation

## 2018-09-11 DIAGNOSIS — I10 Essential (primary) hypertension: Secondary | ICD-10-CM | POA: Insufficient documentation

## 2018-09-11 DIAGNOSIS — Z87891 Personal history of nicotine dependence: Secondary | ICD-10-CM | POA: Insufficient documentation

## 2018-09-11 DIAGNOSIS — Z6841 Body Mass Index (BMI) 40.0 and over, adult: Secondary | ICD-10-CM | POA: Insufficient documentation

## 2018-09-11 DIAGNOSIS — M1990 Unspecified osteoarthritis, unspecified site: Secondary | ICD-10-CM | POA: Insufficient documentation

## 2018-09-11 DIAGNOSIS — L97812 Non-pressure chronic ulcer of other part of right lower leg with fat layer exposed: Secondary | ICD-10-CM | POA: Insufficient documentation

## 2018-09-11 DIAGNOSIS — Z79891 Long term (current) use of opiate analgesic: Secondary | ICD-10-CM | POA: Insufficient documentation

## 2018-09-11 DIAGNOSIS — M25562 Pain in left knee: Secondary | ICD-10-CM | POA: Insufficient documentation

## 2018-09-11 DIAGNOSIS — G8929 Other chronic pain: Secondary | ICD-10-CM | POA: Insufficient documentation

## 2018-09-11 DIAGNOSIS — M109 Gout, unspecified: Secondary | ICD-10-CM | POA: Insufficient documentation

## 2018-09-11 DIAGNOSIS — E11622 Type 2 diabetes mellitus with other skin ulcer: Secondary | ICD-10-CM | POA: Insufficient documentation

## 2018-09-11 MED ORDER — OXYCODONE-ACETAMINOPHEN 10-325 MG PO TABS: 1.0000 | ORAL_TABLET | ORAL | 0 refills | Status: DC | PRN

## 2018-09-11 NOTE — Addendum Note (Signed)
Addended by: Willette Pa on: 09/11/2018 01:15 PM   Modules accepted: Orders

## 2018-09-11 NOTE — Telephone Encounter (Signed)
Oxycodone-Acetaminophen  10/325 mg  Qty 170 Tablets ° °PATIENT USES [facility] °

## 2018-09-14 ENCOUNTER — Encounter: Payer: Self-pay | Admitting: Physician Assistant

## 2018-09-14 ENCOUNTER — Ambulatory Visit: Payer: Self-pay | Admitting: Physician Assistant

## 2018-09-14 VITALS — BP 136/81 | HR 90 | Temp 97.2°F | Ht 65.5 in | Wt >= 6400 oz

## 2018-09-14 DIAGNOSIS — L97909 Non-pressure chronic ulcer of unspecified part of unspecified lower leg with unspecified severity: Secondary | ICD-10-CM

## 2018-09-14 DIAGNOSIS — Z9119 Patient's noncompliance with other medical treatment and regimen: Secondary | ICD-10-CM

## 2018-09-14 DIAGNOSIS — E1165 Type 2 diabetes mellitus with hyperglycemia: Secondary | ICD-10-CM

## 2018-09-14 DIAGNOSIS — Z91199 Patient's noncompliance with other medical treatment and regimen due to unspecified reason: Secondary | ICD-10-CM

## 2018-09-14 DIAGNOSIS — E11622 Type 2 diabetes mellitus with other skin ulcer: Secondary | ICD-10-CM

## 2018-09-14 NOTE — Patient Instructions (Signed)
Bring blood sugar log and all medications to every appointment

## 2018-09-14 NOTE — Progress Notes (Signed)
BP 136/81 (BP Location: Right Arm, Patient Position: Sitting, Cuff Size: Large)   Pulse 90   Temp (!) 97.2 F (36.2 C) (Other (Comment))   Ht 5' 5.5" (1.664 m)   Wt (!) 404 lb 8 oz (183.5 kg)   SpO2 98%   BMI 66.29 kg/m    Subjective:    Patient ID: [name], male    DOB: 16-Nov-1965, 53 y.o.   MRN: [id number]  HPI: [name] is a 53 y.o. male presenting on 09/14/2018 for Diabetes   HPI   Pt again did not bring his bs log.   His appt today was to review his bs log.   Pt has appt later this month with nutritionist and with [facility] for counseling on his eating disorder  Pt is seeing wound care about the ulcer on his leg  Relevant past medical, surgical, family and social history reviewed and updated as indicated. Interim medical history since our last visit reviewed. Allergies and medications reviewed and updated.   Current Outpatient Medications:  .  insulin glargine (LANTUS) 100 UNIT/ML injection, Inject 20 Units into the skin daily., Disp: , Rfl:  .  lidocaine (LMX) 4 % cream, Apply 1 application topically daily., Disp: , Rfl:  .  lisinopril (PRINIVIL,ZESTRIL) 20 MG tablet, Take 1 tablet (20 mg total) by mouth daily., Disp: 90 tablet, Rfl: 1 .  metFORMIN (GLUCOPHAGE) 1000 MG tablet, Take 1 tablet (1,000 mg total) by mouth 2 (two) times daily with a meal., Disp: 180 tablet, Rfl: 1 .  metoprolol tartrate (LOPRESSOR) 50 MG tablet, Take 1 tablet (50 mg total) by mouth 2 (two) times daily., Disp: 60 tablet, Rfl: 1 .  oxyCODONE-acetaminophen (PERCOCET) 10-325 MG tablet, Take 1 tablet by mouth every 4 (four) hours as needed for pain., Disp: 170 tablet, Rfl: 0 .  sitaGLIPtin (JANUVIA) 100 MG tablet, Take 100 mg by mouth daily., Disp: , Rfl:  .  cephALEXin (KEFLEX) 500 MG capsule, Take 1 capsule (500 mg total) by mouth 4 (four) times daily. (Patient not taking: Reported on 09/14/2018), Disp: 28 capsule, Rfl: 0 .  ferrous sulfate 325 (65 FE) MG EC tablet, Take 1 tablet (325 mg total)  2 (two) times daily by mouth. (Patient not taking: Reported on 08/15/2018), Disp: 60 tablet, Rfl: 0   Review of Systems  Constitutional: Negative for appetite change, chills, diaphoresis, fatigue, fever and unexpected weight change.  HENT: Positive for dental problem. Negative for congestion, drooling, ear pain, facial swelling, hearing loss, mouth sores, sneezing, sore throat, trouble swallowing and voice change.   Eyes: Negative for pain, discharge, redness, itching and visual disturbance.  Respiratory: Negative for cough, choking, shortness of breath and wheezing.   Cardiovascular: Positive for leg swelling. Negative for chest pain and palpitations.  Gastrointestinal: Negative for abdominal pain, blood in stool, constipation, diarrhea and vomiting.  Endocrine: Negative for cold intolerance, heat intolerance and polydipsia.  Genitourinary: Negative for decreased urine volume, dysuria and hematuria.  Musculoskeletal: Positive for arthralgias. Negative for back pain and gait problem.  Skin: Positive for rash.  Allergic/Immunologic: Negative for environmental allergies.  Neurological: Negative for seizures, syncope, light-headedness and headaches.  Hematological: Negative for adenopathy.  Psychiatric/Behavioral: Negative for agitation, dysphoric mood and suicidal ideas. The patient is not nervous/anxious.     Per HPI unless specifically indicated above     Objective:    BP 136/81 (BP Location: Right Arm, Patient Position: Sitting, Cuff Size: Large)   Pulse 90   Temp (!) 97.2  F (36.2 C) (Other (Comment))   Ht 5' 5.5" (1.664 m)   Wt (!) 404 lb 8 oz (183.5 kg)   SpO2 98%   BMI 66.29 kg/m   Wt Readings from Last 3 Encounters:  09/14/18 (!) 404 lb 8 oz (183.5 kg)  08/15/18 (!) 403 lb 8 oz (183 kg)  08/09/18 (!) 407 lb (184.6 kg)    Physical Exam Vitals signs reviewed.  Constitutional:      Appearance: He is well-developed.  HENT:     Head: Normocephalic and atraumatic.  Neck:      Musculoskeletal: Neck supple.  Cardiovascular:     Rate and Rhythm: Normal rate and regular rhythm.  Pulmonary:     Effort: Pulmonary effort is normal.     Breath sounds: Normal breath sounds. No wheezing.  Abdominal:     General: Bowel sounds are normal.     Palpations: Abdomen is soft.     Tenderness: There is no abdominal tenderness.  Musculoskeletal:     Comments: Ulcer lateral aspect of R lower leg  Lymphadenopathy:     Cervical: No cervical adenopathy.  Skin:    General: Skin is warm and dry.  Neurological:     Mental Status: He is alert and oriented to person, place, and time.  Psychiatric:        Behavior: Behavior normal.         Assessment & Plan:    Encounter Diagnoses  Name Primary?  . Personal history of noncompliance with medical treatment, presenting hazards to health Yes  . Uncontrolled type 2 diabetes mellitus with hyperglycemia ([place])   . Diabetic ulcer of lower leg ([place])      -discussed with pt that he needs to bring his bs log with him so that we can adjust his insulin -pt to continue with specialists as scheduled -pt to follow up WITH BS LOG 1 month.  RTO sooner prn

## 2018-09-15 NOTE — Progress Notes (Addendum)
CELESTER, LECH ([id number]) Visit Report for 09/11/2018 Chief Complaint Document Details Patient Name: SINJIN, AMERO. Date of Service: 09/11/2018 1:45 PM Medical Record Number: [id number] Patient Account Number: [id number] Date of Birth/Sex: Nov 09, 1965 (53 y.o. M) Treating RN: Harold Barban Primary Care Provider: [name] Other Clinician: Referring Provider: [name] Treating Provider/Extender: Melburn Hake, Cniyah Sproull Weeks in Treatment: 1 Information Obtained from: Patient Chief Complaint Right lower leg ulcer Electronic Signature(s) Signed: 09/12/2018 7:11:38 AM By: Worthy Keeler PA-C Entered By: Worthy Keeler on 09/11/2018 14:26:46 Raschke, Jonette Eva ([id number]) -------------------------------------------------------------------------------- HPI Details Patient Name: [name]. Date of Service: 09/11/2018 1:45 PM Medical Record Number: [id number] Patient Account Number: [id number] Date of Birth/Sex: [birth year] (53 y.o. M) Treating RN: Harold Barban Primary Care Provider: [name] Other Clinician: Referring Provider: [name] Treating Provider/Extender: Melburn Hake, Liberty Stead Weeks in Treatment: 1 History of Present Illness HPI Description: 53 year old male with past medical history of diabetes type 2, HTN, obesity, former smoker, and chronic narcotic use. Present today for right lower leg ulcer. He states that he first noticed wound back in October 2019. At that time he felt that he was bitten by something. There was no OTC treatment. Over time the wound became larger with increased pain. He was seen by [facility] care and started on Keflex antibiotics for cellulitis for 14 days. Finished abx 2-3 days ago. Chronic bilateral knee pain. HA1C in November 2019 was 9.6. He is independent of ADLs however, he is unable to reach the right leg wound area. Left ABI in clinic is 0.94, right is non-compressible. 09/11/18 on evaluation today patient has a wound over the right  lower extremity which appears to be extremely painful for him. He states at this time that the pain is a 10 out of 10 currently. Nonetheless there does not appear to be any signs of infection. He still does not have any insurance at this point and therefore a vascular evaluation is not possible he tells me. Obviously I do think that with his right lower extremity be noncompressible we really need to have vascular evaluation order to consider any type of wrap or compressive therapy. Electronic Signature(s) Signed: 09/24/2018 1:38:17 AM By: Worthy Keeler PA-C Previous Signature: 09/12/2018 7:11:38 AM Version By: Worthy Keeler PA-C Entered By: Worthy Keeler on 09/22/2018 08:53:32 Hamman, Jonette Eva ([id number]) -------------------------------------------------------------------------------- Physical Exam Details Patient Name: [name]. Date of Service: 09/11/2018 1:45 PM Medical Record Number: [id number] Patient Account Number: [id number] Date of Birth/Sex: 1965-10-06 (53 y.o. M) Treating RN: Harold Barban Primary Care Provider: [name] Other Clinician: Referring Provider: [name] Treating Provider/Extender: STONE III, Luka Stohr Weeks in Treatment: 1 Constitutional Well-nourished and well-hydrated in no acute distress. Respiratory normal breathing without difficulty. clear to auscultation bilaterally. Cardiovascular regular rate and rhythm with normal S1, S2. 1+ pitting edema of the bilateral lower extremities. Psychiatric this patient is able to make decisions and demonstrates good insight into disease process. Alert and Oriented x 3. pleasant and cooperative. Notes Currently sharp debridement would likely be a good idea although the patient is having significant pain even light touch causes a lot of problems for him. He really does not appear that he would tolerate sharp debridement well at all at this time. Subsequently we're going to avoid that at this point. Electronic  Signature(s) Signed: 09/12/2018 7:11:38 AM By: Worthy Keeler PA-C Entered By: Worthy Keeler on 09/12/2018 06:43:02 Valenza, Jonette Eva ([id number]) -------------------------------------------------------------------------------- Physician Orders Details Patient  Name: ALVA, BROXSON. Date of Service: 09/11/2018 1:45 PM Medical Record Number: [id number] Patient Account Number: [id number] Date of Birth/Sex: June 17, 1966 (53 y.o. M) Treating RN: Harold Barban Primary Care Provider: [name] Other Clinician: Referring Provider: [name] Treating Provider/Extender: Melburn Hake, Kristof Nadeem Weeks in Treatment: 1 Verbal / Phone Orders: No Diagnosis Coding ICD-10 Coding Code Description E11.621 Type 2 diabetes mellitus with foot ulcer L97.812 Non-pressure chronic ulcer of other part of right lower leg with fat layer exposed I10 Essential (primary) hypertension E66.01 Morbid (severe) obesity due to excess calories Wound Cleansing Wound #1 Right,Lateral Lower Leg o May Shower, gently pat wound dry prior to applying new dressing. Anesthetic (add to Medication List) Wound #1 Right,Lateral Lower Leg o Topical Lidocaine 4% cream applied to wound bed prior to debridement (In Clinic Only). Primary Wound Dressing Wound #1 Right,Lateral Lower Leg o Iodoflex Secondary Dressing o Boardered Foam Dressing Dressing Change Frequency Wound #1 Right,Lateral Lower Leg o Change dressing every other day. Follow-up Appointments Wound #1 Right,Lateral Lower Leg o Return Appointment in 1 week. Electronic Signature(s) Signed: 09/12/2018 7:11:38 AM By: Worthy Keeler PA-C Signed: 09/14/2018 2:41:13 PM By: Harold Barban Entered By: Harold Barban on 09/11/2018 15:29:10 Lucente, Jonette Eva ([id number]) -------------------------------------------------------------------------------- Problem List Details Patient Name: [name]. Date of Service: 09/11/2018 1:45 PM Medical Record Number:  [id number] Patient Account Number: [id number] Date of Birth/Sex: 14-Jan-1966 (53 y.o. M) Treating RN: Harold Barban Primary Care Provider: [name] Other Clinician: Referring Provider: [name] Treating Provider/Extender: Melburn Hake, Verle Brillhart Weeks in Treatment: 1 Active Problems ICD-10 Evaluated Encounter Code Description Active Date Today Diagnosis E11.622 Type 2 diabetes mellitus with other skin ulcer 09/04/2018 No Yes L97.812 Non-pressure chronic ulcer of other part of right lower leg 09/04/2018 No Yes with fat layer exposed [place] (primary) hypertension 09/04/2018 No Yes E66.01 Morbid (severe) obesity due to excess calories 09/04/2018 No Yes Inactive Problems Resolved Problems Electronic Signature(s) Signed: 09/12/2018 7:11:38 AM By: Worthy Keeler PA-C Entered By: Worthy Keeler on 09/12/2018 06:42:07 Tolleson, Jonette Eva ([id number]) -------------------------------------------------------------------------------- Progress Note Details Patient Name: [name]. Date of Service: 09/11/2018 1:45 PM Medical Record Number: [id number] Patient Account Number: [id number] Date of Birth/Sex: 28-Nov-1965 (53 y.o. M) Treating RN: Harold Barban Primary Care Provider: [name] Other Clinician: Referring Provider: [name] Treating Provider/Extender: Melburn Hake, Maleiah Dula Weeks in Treatment: 1 Subjective Chief Complaint Information obtained from Patient Right lower leg ulcer History of Present Illness (HPI) 53 year old male with past medical history of diabetes type 2, HTN, obesity, former smoker, and chronic narcotic use. Present today for right lower leg ulcer. He states that he first noticed wound back in October 2019. At that time he felt that he was bitten by something. There was no OTC treatment. Over time the wound became larger with increased pain. He was seen by [facility] urgent care and started on Keflex antibiotics for cellulitis for 14 days. Finished abx  2-3 days ago. Chronic bilateral knee pain. HA1C in November 2019 was 9.6. He is independent of ADLs however, he is unable to reach the right leg wound area. Left ABI in clinic is 0.94, right is non-compressible. 09/11/18 on evaluation today patient has a wound over the right lower extremity which appears to be extremely painful for him. He states at this time that the pain is a 10 out of 10 currently. Nonetheless there does not appear to be any signs of infection. He still does not have any insurance at this  point and therefore a vascular evaluation is not possible he tells me. Obviously I do think that with his right lower extremity be noncompressible we really need to have vascular evaluation order to consider any type of wrap or compressive therapy. Patient History Information obtained from Patient. Social History Former smoker - started on 09/15/2002 - ended on 09/06/2012, Marital Status - Single, Alcohol Use - Never, Drug Use - No History, Caffeine Use - Daily - sodas. Medical And Surgical History Notes Musculoskeletal Arthritis Review of Systems (ROS) Constitutional Symptoms (General Health) Denies complaints or symptoms of Fever, Chills. Respiratory The patient has no complaints or symptoms. Cardiovascular Complains or has symptoms of LE edema. Psychiatric The patient has no complaints or symptoms. ANSEL, FERRALL ([id number]) Objective Constitutional Well-nourished and well-hydrated in no acute distress. Vitals Time Taken: 2:27 PM, Height: 68 in, Weight: 389 lbs, BMI: 59.1, Temperature: 98.0 F, Pulse: 75 bpm, Respiratory Rate: 18 breaths/min, Blood Pressure: 149/80 mmHg. Respiratory normal breathing without difficulty. clear to auscultation bilaterally. Cardiovascular regular rate and rhythm with normal S1, S2. 1+ pitting edema of the bilateral lower extremities. Psychiatric this patient is able to make decisions and demonstrates good insight into disease process. Alert and  Oriented x 3. pleasant and cooperative. General Notes: Currently sharp debridement would likely be a good idea although the patient is having significant pain even light touch causes a lot of problems for him. He really does not appear that he would tolerate sharp debridement well at all at this time. Subsequently we're going to avoid that at this point. Integumentary (Hair, Skin) Wound #1 status is Open. Original cause of wound was Gradually Appeared. The wound is located on the Right,Lateral Lower Leg. The wound measures 1cm length x 0.9cm width x 0.1cm depth; 0.707cm^2 area and 0.071cm^3 volume. There is Fat Layer (Subcutaneous Tissue) Exposed exposed. There is no tunneling or undermining noted. There is a large amount of purulent drainage noted. The wound margin is distinct with the outline attached to the wound base. There is small (1-33%) granulation within the wound bed. There is a large (67-100%) amount of necrotic tissue within the wound bed including Eschar and Adherent Slough. The periwound skin appearance exhibited: Dry/Scaly, Hemosiderin Staining. The periwound skin appearance did not exhibit: Callus, Crepitus, Excoriation, Induration, Rash, Scarring, Maceration, Atrophie Blanche, Cyanosis, Ecchymosis, Mottled, Pallor, Rubor, Erythema. Assessment Active Problems ICD-10 Type 2 diabetes mellitus with other skin ulcer Non-pressure chronic ulcer of other part of right lower leg with fat layer exposed Essential (primary) hypertension Morbid (severe) obesity due to excess calories Plan Wound Cleansing: FERNANDEZ, KENLEY ([id number]) Wound #1 Right,Lateral Lower Leg: May Shower, gently pat wound dry prior to applying new dressing. Anesthetic (add to Medication List): Wound #1 Right,Lateral Lower Leg: Topical Lidocaine 4% cream applied to wound bed prior to debridement (In Clinic Only). Primary Wound Dressing: Wound #1 Right,Lateral Lower Leg: Iodoflex Secondary Dressing: Boardered  Foam Dressing Dressing Change Frequency: Wound #1 Right,Lateral Lower Leg: Change dressing every other day. Follow-up Appointments: Wound #1 Right,Lateral Lower Leg: Return Appointment in 1 week. Currently I still think that having the patient seen for a vascular evaluation would be very beneficial for him. With that being said at this time I don't think that that's something he is willing to do due to cost. For that reason we're gonna continue to try to manage this is best we can use in the Iodoflex. He's in agreement with this plan. Please see above for specific wound care orders. We will  see patient for re-evaluation in 1 week(s) here in the clinic. If anything worsens or changes patient will contact our office for additional recommendations. Electronic Signature(s) Signed: 09/24/2018 1:38:17 AM By: Worthy Keeler PA-C Previous Signature: 09/12/2018 7:11:38 AM Version By: Worthy Keeler PA-C Entered By: Worthy Keeler on 09/22/2018 08:53:42 Abadi, Jonette Eva ([id number]) -------------------------------------------------------------------------------- ROS/PFSH Details Patient Name: [name]. Date of Service: 09/11/2018 1:45 PM Medical Record Number: [id number] Patient Account Number: [id number] Date of Birth/Sex: 1966-07-31 (53 y.o. M) Treating RN: Harold Barban Primary Care Provider: [name] Other Clinician: Referring Provider: [name] Treating Provider/Extender: Melburn Hake, Janea Schwenn Weeks in Treatment: 1 Information Obtained From Patient Wound History Do you currently have one or more open woundso Yes How many open wounds do you currently haveo 1 Approximately how long have you had your woundso 06/06/2018 How have you been treating your wound(s) until nowo antibiotic(Keflex), soap and water Has your wound(s) ever healed and then re-openedo No Have you had any lab work done in the past montho No Have you tested positive for an antibiotic resistant organism (MRSA,  VRE)o No Have you tested positive for osteomyelitis (bone infection)o No Have you had any tests for circulation on your legso No Have you had other problems associated with your woundso Swelling Constitutional Symptoms (General Health) Complaints and Symptoms: Negative for: Fever; Chills Cardiovascular Complaints and Symptoms: Positive for: LE edema Medical History: Positive for: Hypertension Respiratory Complaints and Symptoms: No Complaints or Symptoms Endocrine Medical History: Positive for: Type II Diabetes - DX age 72A1C 9.6 Time with diabetes: 3 years Treated with: Insulin, Oral agents Blood sugar tested every day: Yes Tested : Genitourinary Medical History: Negative for: End Stage Renal Disease Musculoskeletal EVANDER, MACARAEG ([id number]) Medical History: Positive for: Gout Past Medical History Notes: Arthritis Oncologic Medical History: Negative for: Received Chemotherapy; Received Radiation Psychiatric Complaints and Symptoms: No Complaints or Symptoms Immunizations Pneumococcal Vaccine: Received Pneumococcal Vaccination: No Implantable Devices Family and Social History Former smoker - started on 09/15/2002 - ended on 09/06/2012; Marital Status - Single; Alcohol Use: Never; Drug Use: No History; Caffeine Use: Daily - sodas; Financial Concerns: No; Food, Clothing or Shelter Needs: No; Support System Lacking: No; Transportation Concerns: No; Advanced Directives: No; Patient does not want information on Advanced Directives; Living Will: No; Medical Power of Attorney: No Physician Affirmation I have reviewed and agree with the above information. Electronic Signature(s) Signed: 09/12/2018 7:11:38 AM By: Worthy Keeler PA-C Signed: 09/14/2018 2:41:13 PM By: Harold Barban Entered By: Worthy Keeler on 09/12/2018 06:42:41 Woehler, Jonette Eva ([id number]) -------------------------------------------------------------------------------- SuperBill Details Patient Name: [name]. Date of Service: 09/11/2018 Medical Record Number: [id number] Patient Account Number: [id number] Date of Birth/Sex: Jul 09, 1966 (53 y.o. M) Treating RN: Harold Barban Primary Care Provider: [name] Other Clinician: Referring Provider: [name] Treating Provider/Extender: Melburn Hake, Cassiel Fernandez Weeks in Treatment: 1 Diagnosis Coding ICD-10 Codes Code Description E11.622 Type 2 diabetes mellitus with other skin ulcer L97.812 Non-pressure chronic ulcer of other part of right lower leg with fat layer exposed I10 Essential (primary) hypertension E66.01 Morbid (severe) obesity due to excess calories Facility Procedures CPT4 Code: 76811572 Description: 99213 - WOUND CARE VISIT-LEV 3 EST PT Modifier: Quantity: 1 Physician Procedures CPT4 Code Description: [id number] - WC PHYS LEVEL 4 - EST PT ICD-10 Diagnosis Description E11.622 Type 2 diabetes mellitus with other skin ulcer L97.812 Non-pressure chronic ulcer of other part of right lower leg wi I10 Essential (primary)  hypertension E66.01 Morbid (severe) obesity  due to excess calories Modifier: th fat layer expo Quantity: 1 sed Electronic Signature(s) Signed: 09/12/2018 7:11:38 AM By: Worthy Keeler PA-C Entered By: Worthy Keeler on 09/12/2018 06:43:50

## 2018-09-15 NOTE — Progress Notes (Signed)
PASCO, MARCHITTO ([id number]) Visit Report for 09/11/2018 Arrival Information Details Patient Name: [name], [name]. Date of Service: 09/11/2018 1:45 PM Medical Record Number: [id number] Patient Account Number: [id number] Date of Birth/Sex: [birth year] (53 y.o. M) Treating RN: Harold Barban Primary Care [name]: [name] Other Clinician: Referring [name]: [name] Treating [name]/Extender: [name], [name]: 1 Visit Information History Since Last Visit Added or deleted any medications: No Patient Arrived: Ambulatory Any new allergies or adverse reactions: No Arrival Time: 14:26 Had a fall or experienced change in No Accompanied By: self activities of daily living that may affect Transfer Assistance: None risk of falls: Patient Identification Verified: Yes Signs or symptoms of abuse/neglect since last visito No Secondary Verification Process Completed: Yes Hospitalized since last visit: No Implantable device outside of the clinic excluding No cellular tissue based products placed in the center since last visit: Has Dressing in Place as Prescribed: Yes Pain Present Now: Yes Electronic Signature(s) Signed: 09/11/2018 3:59:13 PM By: Lorine Bears RCP, RRT, CHT Entered By: Lorine Bears on 09/11/2018 14:26:59 [name], [name] ([id number]) -------------------------------------------------------------------------------- Clinic Level of Care Assessment Details Patient Name: [name]. Date of Service: 09/11/2018 1:45 PM Medical Record Number: [id number] Patient Account Number: [id number] Date of Birth/Sex: 10/31/1965 (53 y.o. M) Treating RN: Harold Barban Primary Care Gilliam Hawkes: [name] Other Clinician: Referring Kealohilani Maiorino: [name] Treating Audi Wettstein/Extender: [name], [name]: 1 Clinic Level of Care Assessment Items TOOL 4 Quantity Score []  - Use when only an EandM is performed on FOLLOW-UP  visit 0 ASSESSMENTS - Nursing Assessment / Reassessment X - Reassessment of Co-morbidities (includes updates in patient status) 1 10 X- 1 5 Reassessment of Adherence to [name] Plan ASSESSMENTS - Wound and Skin Assessment / Reassessment X - Simple Wound Assessment / Reassessment - one wound 1 5 []  - 0 Complex Wound Assessment / Reassessment - multiple wounds []  - 0 Dermatologic / Skin Assessment (not related to wound area) ASSESSMENTS - Focused Assessment []  - Circumferential Edema Measurements - multi extremities 0 []  - 0 Nutritional Assessment / Counseling / Intervention []  - 0 Lower Extremity Assessment (monofilament, tuning fork, pulses) []  - 0 Peripheral Arterial Disease Assessment (using hand held doppler) ASSESSMENTS - Ostomy and/or Continence Assessment and Care []  - Incontinence Assessment and Management 0 []  - 0 Ostomy Care Assessment and Management (repouching, etc.) PROCESS - Coordination of Care X - Simple Patient / Family Education for ongoing care 1 15 []  - 0 Complex (extensive) Patient / Family Education for ongoing care X- 1 10 Staff obtains Programmer, systems, Records, Test Results / Process Orders []  - 0 Staff telephones HHA, Nursing Homes / Clarify orders / etc []  - 0 Routine Transfer to another Facility (non-emergent condition) []  - 0 Routine Hospital Admission (non-emergent condition) []  - 0 New Admissions / Biomedical engineer / Ordering NPWT, Apligraf, etc. []  - 0 Emergency Hospital Admission (emergent condition) X- 1 10 Simple Discharge Coordination [name], [name] ([id number]) []  - 0 Complex (extensive) Discharge Coordination PROCESS - Special Needs []  - Pediatric / Minor Patient Management 0 []  - 0 Isolation Patient Management []  - 0 Hearing / Language / Visual special needs []  - 0 Assessment of Community assistance (transportation, D/C planning, etc.) []  - 0 Additional assistance / Altered mentation []  - 0 Support Surface(s) Assessment  (bed, cushion, seat, etc.) INTERVENTIONS - Wound Cleansing / Measurement X - Simple Wound Cleansing - one wound 1 5 []  - 0 Complex Wound Cleansing - multiple wounds  X- 1 5 Wound Imaging (photographs - any number of wounds) []  - 0 Wound Tracing (instead of photographs) X- 1 5 Simple Wound Measurement - one wound []  - 0 Complex Wound Measurement - multiple wounds INTERVENTIONS - Wound Dressings X - Small Wound Dressing one or multiple wounds 1 10 []  - 0 Medium Wound Dressing one or multiple wounds []  - 0 Large Wound Dressing one or multiple wounds []  - 0 Application of Medications - topical []  - 0 Application of Medications - injection INTERVENTIONS - Miscellaneous []  - External ear exam 0 []  - 0 Specimen Collection (cultures, biopsies, blood, body fluids, etc.) []  - 0 Specimen(s) / Culture(s) sent or taken to Lab for analysis []  - 0 Patient Transfer (multiple staff / Civil Service fast streamer / Similar devices) []  - 0 Simple Staple / Suture removal (25 or less) []  - 0 Complex Staple / Suture removal (26 or more) []  - 0 Hypo / Hyperglycemic Management (close monitor of Blood Glucose) []  - 0 Ankle / Brachial Index (ABI) - do not check if billed separately X- 1 5 Vital Signs [name], [name] ([id number]) Has the patient been seen at the hospital within the last three years: Yes Total Score: 85 Level Of Care: New/Established - Level 3 Electronic Signature(s) Signed: 09/14/2018 2:41:13 PM By: Harold Barban Entered By: Harold Barban on 09/11/2018 15:26:24 [name], [name] ([id number]) -------------------------------------------------------------------------------- Lower Extremity Assessment Details Patient Name: [name]. Date of Service: 09/11/2018 1:45 PM Medical Record Number: [id number] Patient Account Number: [id number] Date of Birth/Sex: May 28, 1966 (53 y.o. M) Treating RN: Secundino Ginger Primary Care Shylin Keizer: [name] Other Clinician: Referring Angello Chien: [name] Treating Django Nguyen/Extender: [name], [name]: 1 Edema Assessment Assessed: [Left: No] [Right: No] [Left: Edema] [Right: :] Calf Left: Right: Point of Measurement: 35 cm From Medial Instep cm cm Ankle Left: Right: Point of Measurement: 12 cm From Medial Instep cm cm Vascular Assessment Pulses: Dorsalis Pedis Palpable: [Right:Yes] Posterior Tibial Extremity colors, hair growth, and conditions: Extremity Color: [Right:Normal] Hair Growth on Extremity: [Right:No] Temperature of Extremity: [Right:Warm] Capillary Refill: [Right:< 3 seconds] Toe Nail Assessment Left: Right: Thick: Yes Discolored: Yes Deformed: Yes Improper Length and Hygiene: Yes Electronic Signature(s) Signed: 09/11/2018 4:23:06 PM By: Secundino Ginger Entered By: Secundino Ginger on 09/11/2018 14:45:35 [name], [name] ([id number]) -------------------------------------------------------------------------------- Multi Wound Chart Details Patient Name: [name]. Date of Service: 09/11/2018 1:45 PM Medical Record Number: [id number] Patient Account Number: [id number] Date of Birth/Sex: 01/28/1966 (53 y.o. M) Treating RN: Harold Barban Primary Care Elex Mainwaring: [name] Other Clinician: Referring Aasiya Creasey: [name] Treating Angela Vazguez/Extender: STONE III, [name]: 1 Vital Signs Height(in): 68 Pulse(bpm): 75 Weight(lbs): 389 Blood Pressure(mmHg): 149/80 Body Mass Index(BMI): 59 Temperature(F): 98.0 Respiratory Rate 18 (breaths/min): Photos: [1:No Photos] [N/A:N/A] Wound Location: [1:Right Lower Leg - Lateral] [N/A:N/A] Wounding Event: [1:Gradually Appeared] [N/A:N/A] Primary Etiology: [1:Diabetic Wound/Ulcer of the Lower Extremity] [N/A:N/A] Comorbid History: [1:Hypertension, Type II Diabetes, Gout] [N/A:N/A] Date Acquired: [1:06/06/2018] [N/A:N/A] Weeks of [name]: [1:1] [N/A:N/A] Wound Status: [1:Open] [N/A:N/A] Measurements L x W x D [1:1x0.9x0.1]  [N/A:N/A] (cm) Area (cm) : [1:0.707] [N/A:N/A] Volume (cm) : [1:0.071] [N/A:N/A] % Reduction in Area: [1:82.00%] [N/A:N/A] % Reduction in Volume: [1:81.90%] [N/A:N/A] Classification: [1:Grade 2] [N/A:N/A] Exudate Amount: [1:Large] [N/A:N/A] Exudate Type: [1:Purulent] [N/A:N/A] Exudate Color: [1:yellow, brown, green] [N/A:N/A] Wound Margin: [1:Distinct, outline attached] [N/A:N/A] Granulation Amount: [1:Small (1-33%)] [N/A:N/A] Necrotic Amount: [1:Large (67-100%)] [N/A:N/A] Necrotic Tissue: [1:Eschar, Adherent Slough] [N/A:N/A] Exposed Structures: [1:Fat Layer (Subcutaneous Tissue) Exposed: Yes  Fascia: No Tendon: No Muscle: No Joint: No Bone: No] [N/A:N/A] Epithelialization: [1:None] [N/A:N/A] Periwound Skin Texture: [1:Excoriation: No Induration: No Callus: No Crepitus: No] [N/A:N/A] Rash: No Scarring: No Periwound Skin Moisture: Dry/Scaly: Yes N/A N/A Maceration: No Periwound Skin Color: Hemosiderin Staining: Yes N/A N/A Atrophie Blanche: No Cyanosis: No Ecchymosis: No Erythema: No Mottled: No Pallor: No Rubor: No Tenderness on Palpation: No N/A N/A Wound Preparation: Ulcer Cleansing: N/A N/A Rinsed/Irrigated with Saline Topical Anesthetic Applied: Other: lidocaine 4% [name] Notes Electronic Signature(s) Signed: 09/14/2018 2:41:13 PM By: Harold Barban Entered By: Harold Barban on 09/11/2018 15:23:56 [place], [name] ([id number]) -------------------------------------------------------------------------------- [place] Details Patient Name: [name]. Date of Service: 09/11/2018 1:45 PM Medical Record Number: [id number] Patient Account Number: [id number] Date of Birth/Sex: 1966/01/29 (53 y.o. M) Treating RN: Harold Barban Primary Care Kemya Shed: [name] Other Clinician: Referring Lilley Hubble: [name] Treating Bristol Osentoski/Extender: [name], [name]: 1 Active Inactive Venous Leg Ulcer Nursing  Diagnoses: Knowledge deficit related to disease process and management Goals: Patient will maintain optimal edema control Date Initiated: 09/04/2018 Target Resolution Date: 10/05/2018 Goal Status: Active Patient/caregiver will verbalize understanding of disease process and disease management Date Initiated: 09/04/2018 Target Resolution Date: 10/05/2018 Goal Status: Active Interventions: Provide education on venous insufficiency Notes: Wound/Skin Impairment Nursing Diagnoses: Impaired tissue integrity Goals: Ulcer/skin breakdown will have a volume reduction of 30% by week 4 Date Initiated: 09/04/2018 Target Resolution Date: 10/05/2018 Goal Status: Active Interventions: Assess patient/caregiver ability to obtain necessary supplies Assess patient/caregiver ability to perform ulcer/skin care regimen upon admission and as needed Notes: Electronic Signature(s) Signed: 09/14/2018 2:41:13 PM By: Harold Barban Entered By: Harold Barban on 09/11/2018 15:23:47 [name], [name] ([id number]) -------------------------------------------------------------------------------- Pain Assessment Details Patient Name: [name]. Date of Service: 09/11/2018 1:45 PM Medical Record Number: [id number] Patient Account Number: [id number] Date of Birth/Sex: 06/19/1966 (53 y.o. M) Treating RN: Harold Barban Primary Care Shayaan Parke: [name] Other Clinician: Referring Andilyn Bettcher: [name] Treating Brailynn Breth/Extender: [name], [name]: 1 Active Problems Location of Pain Severity and Description of Pain Patient Has Paino Yes Site Locations Rate the pain. Current Pain Level: 6 Pain Management and Medication Current Pain Management: Electronic Signature(s) Signed: 09/11/2018 3:59:13 PM By: Lorine Bears RCP, RRT, CHT Signed: 09/14/2018 2:41:13 PM By: Harold Barban Entered By: Lorine Bears on 09/11/2018 14:27:10 [name], [name]  ([id number]) -------------------------------------------------------------------------------- Patient/Caregiver Education Details Patient Name: [name]. Date of Service: 09/11/2018 1:45 PM Medical Record Number: [id number] Patient Account Number: [id number] Date of Birth/Gender: 1966/02/19 (53 y.o. M) Treating RN: Harold Barban Primary Care Physician: [name] Other Clinician: Referring Physician: Soyla [name] Treating Physician/Extender: Sharalyn Ink in [name]: 1 Education Assessment Education Provided To: Patient Education Topics Provided Electronic Signature(s) Signed: 09/14/2018 2:41:13 PM By: Harold Barban Entered By: Harold Barban on 09/11/2018 15:24:05 [name], [name] ([id number]) -------------------------------------------------------------------------------- Wound Assessment Details Patient Name: [name]. Date of Service: 09/11/2018 1:45 PM Medical Record Number: [id number] Patient Account Number: [id number] Date of Birth/Sex: 02/28/1966 (53 y.o. M) Treating RN: Secundino Ginger Primary Care Varina Hulon: [name] Other Clinician: Referring Levell Tavano: [name] Treating Lovely Kerins/Extender: STONE III, [name]: 1 Wound Status Wound Number: 1 Primary Etiology: Diabetic Wound/Ulcer of the Lower Extremity Wound Location: Right Lower Leg - Lateral Wound Status: Open Wounding Event: Gradually Appeared Comorbid Hypertension, Type II Diabetes, Gout Date Acquired: 06/06/2018 History: Weeks Of [name]: 1 Clustered Wound: No Photos Photo Uploaded By: Secundino Ginger on 09/11/2018 16:13:59 Wound  Measurements Length: (cm) 1 % Reduction Width: (cm) 0.9 % Reduction Depth: (cm) 0.1 Epitheliali Area: (cm) 0.707 Tunneling: Volume: (cm) 0.071 Underminin in Area: 82% in Volume: 81.9% zation: None No g: No Wound Description Classification: Grade 2 Foul Odor A Wound Margin: Distinct, outline attached Slough/Fibr Exudate Amount:  Large Exudate Type: Purulent Exudate Color: yellow, brown, green fter Cleansing: No ino Yes Wound Bed Granulation Amount: Small (1-33%) Exposed Structure Necrotic Amount: Large (67-100%) Fascia Exposed: No Necrotic Quality: Eschar, Adherent Slough Fat Layer (Subcutaneous Tissue) Exposed: Yes Tendon Exposed: No Muscle Exposed: No Joint Exposed: No Bone Exposed: No Periwound Skin Texture [name], [name] ([id number]) Texture Color No Abnormalities Noted: No No Abnormalities Noted: No Callus: No Atrophie Blanche: No Crepitus: No Cyanosis: No Excoriation: No Ecchymosis: No Induration: No Erythema: No Rash: No Hemosiderin Staining: Yes Scarring: No Mottled: No Pallor: No Moisture Rubor: No No Abnormalities Noted: No Dry / Scaly: Yes Maceration: No Wound Preparation Ulcer Cleansing: Rinsed/Irrigated with Saline Topical Anesthetic Applied: Other: lidocaine 4%, Electronic Signature(s) Signed: 09/11/2018 4:23:06 PM By: Secundino Ginger Entered By: Secundino Ginger on 09/11/2018 14:42:31 [name], [name] ([id number]) -------------------------------------------------------------------------------- [place] Details Patient Name: [name]. Date of Service: 09/11/2018 1:45 PM Medical Record Number: [id number] Patient Account Number: [id number] Date of Birth/Sex: 02/02/1966 (53 y.o. M) Treating RN: Harold Barban Primary Care Josephanthony Tindel: [name] Other Clinician: Referring Aleister Lady: [name] Treating Tasfia Vasseur/Extender: STONE III, [name]: 1 Vital Signs Time Taken: 14:27 Temperature (F): 98.0 Height (in): 68 Pulse (bpm): 75 Weight (lbs): 389 Respiratory Rate (breaths/min): 18 Body Mass Index (BMI): 59.1 Blood Pressure (mmHg): 149/80 Reference Range: 80 - 120 mg / dl Electronic Signature(s) Signed: 09/11/2018 3:59:13 PM By: Lorine Bears RCP, RRT, CHT Entered By: Lorine Bears on 09/11/2018 14:34:25

## 2018-09-18 ENCOUNTER — Ambulatory Visit: Payer: Self-pay | Admitting: Physician Assistant

## 2018-09-19 ENCOUNTER — Ambulatory Visit: Payer: Self-pay | Admitting: Nutrition

## 2018-09-19 ENCOUNTER — Telehealth: Payer: Self-pay | Admitting: Nutrition

## 2018-09-19 ENCOUNTER — Encounter: Payer: Self-pay | Admitting: Physician Assistant

## 2018-09-19 NOTE — Telephone Encounter (Signed)
TC to pt to reschedule canceled appt, He notes he was too tired after going to wound clinic in [place] today.  He notes he is taking 20 units of Lantus a day, along with metformin and Januvia and BS are in the 200's before meals and at bedtime.  Suggested he call and ask his PCP if he can increase Lantus 20% or 4 units for a total of 24 units in am to get FBS closer to 130 mg/dl.in am. Also suggested he get referral to Dr. Dorris Fetch, Endocrinologist due to poor blood sugars and unhealing wound. He verbalized understanding and says he will call provider tomorrow. Appt rescheduled for 10-12-18 at 1:30.

## 2018-09-20 ENCOUNTER — Telehealth: Payer: Self-pay | Admitting: Physician Assistant

## 2018-09-20 NOTE — Telephone Encounter (Signed)
Pt to increase insulin by 4 units.  He is reminded to call office for fbs > 300 or < 70.  He is urged to bring his bs log to his next OV.

## 2018-09-22 NOTE — Progress Notes (Signed)
[name], [name] ([id number]) Visit Report for 09/19/2018 Chief Complaint Document Details Patient Name: [name], [name]. Date of Service: 09/19/2018 1:00 PM Medical Record Number: [id number] Patient Account Number: [id number] Date of Birth/Sex: 04/18/1966 (53 y.o. M) Treating RN: Harold Barban Primary Care Provider: [name] Other Clinician: Referring Provider: [name] Treating Provider/Extender: Melburn Hake, HOYT Weeks in Treatment: 2 Information Obtained from: Patient Chief Complaint Right lower leg ulcer Electronic Signature(s) Signed: 09/19/2018 5:17:02 PM By: Worthy Keeler PA-C Entered By: Worthy Keeler on 09/19/2018 13:43:27 Singleton, Jonette Eva ([id number]) -------------------------------------------------------------------------------- Debridement Details Patient Name: [name]. Date of Service: 09/19/2018 1:00 PM Medical Record Number: [id number] Patient Account Number: [id number] Date of Birth/Sex: [birth year] (53 y.o. M) Treating RN: Montey Hora Primary Care Provider: [name] Other Clinician: Referring Provider: [name] Treating Provider/Extender: Melburn Hake, HOYT Weeks in Treatment: 2 Debridement Performed for Wound #1 Right,Lateral Lower Leg Assessment: Performed By: Physician STONE III, HOYT E., PA-C Debridement Type: Debridement Severity of Tissue Pre Fat layer exposed Debridement: Level of Consciousness (Pre- Awake and Alert procedure): Pre-procedure Verification/Time Yes - 13:49 Out Taken: Start Time: 13:49 Pain Control: Lidocaine 4% Topical Solution Total Area Debrided (L x W): 3 (cm) x 2.1 (cm) = 6.3 (cm) Tissue and other material Viable, Non-Viable, Slough, Subcutaneous, Skin: Dermis , Skin: Epidermis, Slough debrided: Level: Skin/Subcutaneous Tissue Debridement Description: Excisional Instrument: Curette Bleeding: Minimum Hemostasis Achieved: Pressure End Time: 13:51 Procedural Pain: 0 Post Procedural Pain:  0 Response to Treatment: Procedure was tolerated well Level of Consciousness Awake and Alert (Post-procedure): Post Debridement Measurements of Total Wound Length: (cm) 3 Width: (cm) 2.1 Depth: (cm) 0.4 Volume: (cm) 1.979 Character of Wound/Ulcer Post Debridement: Improved Severity of Tissue Post Debridement: Fat layer exposed Post Procedure Diagnosis Same as Pre-procedure Electronic Signature(s) Signed: 09/19/2018 5:17:02 PM By: Worthy Keeler PA-C Signed: 09/19/2018 5:22:36 PM By: Montey Hora Entered By: Montey Hora on 09/19/2018 13:51:09 Bruno, Jonette Eva ([id number]) -------------------------------------------------------------------------------- HPI Details Patient Name: [name]. Date of Service: 09/19/2018 1:00 PM Medical Record Number: [id number] Patient Account Number: [id number] Date of Birth/Sex: 05-15-1966 (53 y.o. M) Treating RN: Harold Barban Primary Care Provider: [name] Other Clinician: Referring Provider: [name] Treating Provider/Extender: Melburn Hake, HOYT Weeks in Treatment: 2 History of Present Illness HPI Description: 53 year old male with past medical history of diabetes type 2, HTN, obesity, former smoker, and chronic narcotic use. Present today for right lower leg ulcer. He states that he first noticed wound back in October 2019. At that time he felt that he was bitten by something. There was no OTC treatment. Over time the wound became larger with increased pain. He was seen by [facility] and started on Keflex antibiotics for cellulitis for 14 days. Finished abx 2-3 days ago. Chronic bilateral knee pain. HA1C in November 2019 was 9.6. He is independent of ADLs however, he is unable to reach the right leg wound area. Left ABI in clinic is 0.94, right is non-compressible. 09/11/17 on evaluation today patient has a wound over the right lower extremity which appears to be extremely painful for him. He states at this time that  the pain is a 10 out of 10 currently. Nonetheless there does not appear to be any signs of infection. He still does not have any insurance at this point and therefore a vascular evaluation is not possible he tells me. Obviously I do think that with his right lower extremity be noncompressible we really need to have  vascular evaluation order to consider any type of wrap or compressive therapy. 09/19/18 on evaluation today patient appears to be doing a little better in regard to his right lateral lower Trinity ulcer. He has been tolerating the dressing changes without complication. Fortunately there is no sign of infection at this time. He has been tolerating the dressing changes without complication. He does have significant fluid buildup of the right lower extremity but at this point no wraps are being performed as he was noncompressible during initial evaluation. He also has not wanted [place] to proceed with a vascular referral for arterial studies due to cost. He does not currently have insurance. Electronic Signature(s) Signed: 09/19/2018 5:17:02 PM By: Worthy Keeler PA-C Entered By: Worthy Keeler on 09/19/2018 14:26:09 Orne, Jonette Eva ([id number]) -------------------------------------------------------------------------------- Physical Exam Details Patient Name: [name]. Date of Service: 09/19/2018 1:00 PM Medical Record Number: [id number] Patient Account Number: [id number] Date of Birth/Sex: 1966-08-18 (53 y.o. M) Treating RN: Harold Barban Primary Care Provider: [name] Other Clinician: Referring Provider: [name] Treating Provider/Extender: STONE III, HOYT Weeks in Treatment: 2 Constitutional Well-nourished and well-hydrated in no acute distress. Respiratory normal breathing without difficulty. Psychiatric this patient is able to make decisions and demonstrates good insight into disease process. Alert and Oriented x 3. pleasant and cooperative. Notes Upon  inspection today patient's wounds actually do show some signs of improvement which is good news. Fortunately there does not appear to be any evidence of infection at this time which is also good news. In general have been very pleased with how things seem to be progressing Electronic Signature(s) Signed: 09/19/2018 5:17:02 PM By: Worthy Keeler PA-C Entered By: Worthy Keeler on 09/19/2018 14:27:22 Swetz, Jonette Eva ([id number]) -------------------------------------------------------------------------------- Physician Orders Details Patient Name: [name]. Date of Service: 09/19/2018 1:00 PM Medical Record Number: [id number] Patient Account Number: [id number] Date of Birth/Sex: 09/21/1965 (53 y.o. M) Treating RN: Montey Hora Primary Care Provider: [name] Other Clinician: Referring Provider: [name] Treating Provider/Extender: Melburn Hake, HOYT Weeks in Treatment: 2 Verbal / Phone Orders: No Diagnosis Coding ICD-10 Coding Code Description E11.622 Type 2 diabetes mellitus with other skin ulcer L97.812 Non-pressure chronic ulcer of other part of right lower leg with fat layer exposed I10 Essential (primary) hypertension E66.01 Morbid (severe) obesity due to excess calories Wound Cleansing Wound #1 Right,Lateral Lower Leg o May Shower, gently pat wound dry prior to applying new dressing. Anesthetic (add to Medication List) Wound #1 Right,Lateral Lower Leg o Topical Lidocaine 4% cream applied to wound bed prior to debridement (In Clinic Only). Primary Wound Dressing Wound #1 Right,Lateral Lower Leg o Silver Collagen Secondary Dressing o Boardered Foam Dressing Dressing Change Frequency Wound #1 Right,Lateral Lower Leg o Change dressing every other day. Follow-up Appointments Wound #1 Right,Lateral Lower Leg o Return Appointment in 1 week. Electronic Signature(s) Signed: 09/19/2018 5:17:02 PM By: Worthy Keeler PA-C Signed: 09/19/2018 5:22:36 PM By:  Montey Hora Entered By: Montey Hora on 09/19/2018 13:53:07 Felmlee, Jonette Eva ([id number]) -------------------------------------------------------------------------------- Problem List Details Patient Name: [name]. Date of Service: 09/19/2018 1:00 PM Medical Record Number: [id number] Patient Account Number: [id number] Date of Birth/Sex: 07/08/1966 (53 y.o. M) Treating RN: Harold Barban Primary Care Provider: [name] Other Clinician: Referring Provider: [name] Treating Provider/Extender: Melburn Hake, HOYT Weeks in Treatment: 2 Active Problems ICD-10 Evaluated Encounter Code Description Active Date Today Diagnosis E11.622 Type 2 diabetes mellitus with other skin ulcer 09/04/2018 No Yes L97.812 Non-pressure chronic ulcer of other  part of right lower leg 09/04/2018 No Yes with fat layer exposed [place] (primary) hypertension 09/04/2018 No Yes E66.01 Morbid (severe) obesity due to excess calories 09/04/2018 No Yes Inactive Problems Resolved Problems Electronic Signature(s) Signed: 09/19/2018 5:17:02 PM By: Worthy Keeler PA-C Entered By: Worthy Keeler on 09/19/2018 13:43:22 Pankow, Jonette Eva ([id number]) -------------------------------------------------------------------------------- Progress Note Details Patient Name: [name]. Date of Service: 09/19/2018 1:00 PM Medical Record Number: [id number] Patient Account Number: [id number] Date of Birth/Sex: February 05, 1966 (53 y.o. M) Treating RN: Harold Barban Primary Care Provider: [name] Other Clinician: Referring Provider: [name] Treating Provider/Extender: Melburn Hake, HOYT Weeks in Treatment: 2 Subjective Chief Complaint Information obtained from Patient Right lower leg ulcer History of Present Illness (HPI) 53 year old male with past medical history of diabetes type 2, HTN, obesity, former smoker, and chronic narcotic use. Present today for right lower leg ulcer. He states that he  first noticed wound back in October 2019. At that time he felt that he was bitten by something. There was no OTC treatment. Over time the wound became larger with increased pain. He was seen by [facility] urgent care and started on Keflex antibiotics for cellulitis for 14 days. Finished abx 2-3 days ago. Chronic bilateral knee pain. HA1C in November 2019 was 9.6. He is independent of ADLs however, he is unable to reach the right leg wound area. Left ABI in clinic is 0.94, right is non-compressible. 09/11/17 on evaluation today patient has a wound over the right lower extremity which appears to be extremely painful for him. He states at this time that the pain is a 10 out of 10 currently. Nonetheless there does not appear to be any signs of infection. He still does not have any insurance at this point and therefore a vascular evaluation is not possible he tells me. Obviously I do think that with his right lower extremity be noncompressible we really need to have vascular evaluation order to consider any type of wrap or compressive therapy. 09/19/18 on evaluation today patient appears to be doing a little better in regard to his right lateral lower Trinity ulcer. He has been tolerating the dressing changes without complication. Fortunately there is no sign of infection at this time. He has been tolerating the dressing changes without complication. He does have significant fluid buildup of the right lower extremity but at this point no wraps are being performed as he was noncompressible during initial evaluation. He also has not wanted [place] to proceed with a vascular referral for arterial studies due to cost. He does not currently have insurance. Patient History Information obtained from Patient. Social History Former smoker - started on 09/15/2002 - ended on 09/06/2012, Marital Status - Single, Alcohol Use - Never, Drug Use - No History, Caffeine Use - Daily - sodas. Medical And Surgical History  Notes Musculoskeletal Arthritis Review of Systems (ROS) Constitutional Symptoms (General Health) Denies complaints or symptoms of Fever, Chills. Respiratory The patient has no complaints or symptoms. Cardiovascular The patient has no complaints or symptoms. Psychiatric The patient has no complaints or symptoms. SCOTTIE, METAYER ([id number]) Objective Constitutional Well-nourished and well-hydrated in no acute distress. Vitals Time Taken: 1:30 PM, Height: 68 in, Weight: 389 lbs, BMI: 59.1, Temperature: 98.0 F, Pulse: 85 bpm, Respiratory Rate: 18 breaths/min, Blood Pressure: 148/66 mmHg. Respiratory normal breathing without difficulty. Psychiatric this patient is able to make decisions and demonstrates good insight into disease process. Alert and Oriented x 3. pleasant and cooperative. General Notes:  Upon inspection today patient's wounds actually do show some signs of improvement which is good news. Fortunately there does not appear to be any evidence of infection at this time which is also good news. In general have been very pleased with how things seem to be progressing Integumentary (Hair, Skin) Wound #1 status is Open. Original cause of wound was Gradually Appeared. The wound is located on the Right,Lateral Lower Leg. The wound measures 3cm length x 2.1cm width x 0.3cm depth; 4.948cm^2 area and 1.484cm^3 volume. There is Fat Layer (Subcutaneous Tissue) Exposed exposed. There is no tunneling or undermining noted. There is a large amount of purulent drainage noted. The wound margin is distinct with the outline attached to the wound base. There is small (1-33%) granulation within the wound bed. There is a large (67-100%) amount of necrotic tissue within the wound bed including Eschar and Adherent Slough. The periwound skin appearance exhibited: Dry/Scaly, Hemosiderin Staining. The periwound skin appearance did not exhibit: Callus, Crepitus, Excoriation, Induration, Rash, Scarring,  Maceration, Atrophie Blanche, Cyanosis, Ecchymosis, Mottled, Pallor, Rubor, Erythema. The periwound has tenderness on palpation. Assessment Active Problems ICD-10 Type 2 diabetes mellitus with other skin ulcer Non-pressure chronic ulcer of other part of right lower leg with fat layer exposed Essential (primary) hypertension Morbid (severe) obesity due to excess calories Procedures Escudero, Kennedy Z. ([id number]) Wound #1 Pre-procedure diagnosis of Wound #1 is a Diabetic Wound/Ulcer of the Lower Extremity located on the Right,Lateral Lower Leg .Severity of Tissue Pre Debridement is: Fat layer exposed. There was a Excisional Skin/Subcutaneous Tissue Debridement with a total area of 6.3 sq cm performed by STONE III, HOYT E., PA-C. With the following instrument(s): Curette to remove Viable and Non-Viable tissue/material. Material removed includes Subcutaneous Tissue, Slough, Skin: Dermis, and Skin: Epidermis after achieving pain control using Lidocaine 4% Topical Solution. No specimens were taken. A time out was conducted at 13:49, prior to the start of the procedure. A Minimum amount of bleeding was controlled with Pressure. The procedure was tolerated well with a pain level of 0 throughout and a pain level of 0 following the procedure. Post Debridement Measurements: 3cm length x 2.1cm width x 0.4cm depth; 1.979cm^3 volume. Character of Wound/Ulcer Post Debridement is improved. Severity of Tissue Post Debridement is: Fat layer exposed. Post procedure Diagnosis Wound #1: Same as Pre-Procedure Plan Wound Cleansing: Wound #1 Right,Lateral Lower Leg: May Shower, gently pat wound dry prior to applying new dressing. Anesthetic (add to Medication List): Wound #1 Right,Lateral Lower Leg: Topical Lidocaine 4% cream applied to wound bed prior to debridement (In Clinic Only). Primary Wound Dressing: Wound #1 Right,Lateral Lower Leg: Silver Collagen Secondary Dressing: Boardered Foam Dressing Dressing  Change Frequency: Wound #1 Right,Lateral Lower Leg: Change dressing every other day. Follow-up Appointments: Wound #1 Right,Lateral Lower Leg: Return Appointment in 1 week. I'm in a recommend at this point that following debridement which seems to have gone very well I do believe that we can switch to [facility] and see how things progress. The patient is in agreement with plan. We will subsequently see were things stand at follow-up. Please see above for specific wound care orders. We will see patient for re-evaluation in 1 week(s) here in the clinic. If anything worsens or changes patient will contact our office for additional recommendations. Electronic Signature(s) Signed: 09/19/2018 5:17:02 PM By: Worthy Keeler PA-C Entered By: Worthy Keeler on 09/19/2018 14:27:31 Patton, Jonette Eva ([id number]) -------------------------------------------------------------------------------- ROS/PFSH Details Patient Name: [name]. Date of Service: 09/19/2018 1:00 PM  Medical Record Number: [id number] Patient Account Number: [id number] Date of Birth/Sex: [birth year] (53 y.o. M) Treating RN: Harold Barban Primary Care Provider: [name] Other Clinician: Referring Provider: [name] Treating Provider/Extender: Melburn Hake, HOYT Weeks in Treatment: 2 Information Obtained From Patient Wound History Do you currently have one or more open woundso Yes How many open wounds do you currently haveo 1 Approximately how long have you had your woundso 06/06/2018 How have you been treating your wound(s) until nowo antibiotic(Keflex), soap and water Has your wound(s) ever healed and then re-openedo No Have you had any lab work done in the past montho No Have you tested positive for an antibiotic resistant organism (MRSA, VRE)o No Have you tested positive for osteomyelitis (bone infection)o No Have you had any tests for circulation on your legso No Have you had other problems associated with your  woundso Swelling Constitutional Symptoms (General Health) Complaints and Symptoms: Negative for: Fever; Chills Respiratory Complaints and Symptoms: No Complaints or Symptoms Cardiovascular Complaints and Symptoms: No Complaints or Symptoms Medical History: Positive for: Hypertension Endocrine Medical History: Positive for: Type II Diabetes - DX age 3A1C 8.6 Time with diabetes: 3 years Treated with: Insulin, Oral agents Blood sugar tested every day: Yes Tested : Genitourinary Medical History: Negative for: End Stage Renal Disease Musculoskeletal EDMAN, LIPSEY ([id number]) Medical History: Positive for: Gout Past Medical History Notes: Arthritis Oncologic Medical History: Negative for: Received Chemotherapy; Received Radiation Psychiatric Complaints and Symptoms: No Complaints or Symptoms Immunizations Pneumococcal Vaccine: Received Pneumococcal Vaccination: No Implantable Devices Family and Social History Former smoker - started on 09/15/2002 - ended on 09/06/2012; Marital Status - Single; Alcohol Use: Never; Drug Use: No History; Caffeine Use: Daily - sodas; Financial Concerns: No; Food, Clothing or Shelter Needs: No; Support System Lacking: No; Transportation Concerns: No; Advanced Directives: No; Patient does not want information on Advanced Directives; Living Will: No; Medical Power of Attorney: No Physician Affirmation I have reviewed and agree with the above information. Electronic Signature(s) Signed: 09/19/2018 5:17:02 PM By: Worthy Keeler PA-C Signed: 09/21/2018 4:21:01 PM By: Harold Barban Entered By: Worthy Keeler on 09/19/2018 14:27:06 Weidler, Jonette Eva ([id number]) -------------------------------------------------------------------------------- SuperBill Details Patient Name: [name]. Date of Service: 09/19/2018 Medical Record Number: [id number] Patient Account Number: [id number] Date of Birth/Sex: April 09, 1966 (53 y.o. M) Treating RN: Harold Barban Primary Care Provider: [name] Other Clinician: Referring Provider: [name] Treating Provider/Extender: Melburn Hake, HOYT Weeks in Treatment: 2 Diagnosis Coding ICD-10 Codes Code Description E11.622 Type 2 diabetes mellitus with other skin ulcer L97.812 Non-pressure chronic ulcer of other part of right lower leg with fat layer exposed I10 Essential (primary) hypertension E66.01 Morbid (severe) obesity due to excess calories Facility Procedures CPT4 Code Description: [id number] - DEB SUBQ TISSUE 20 SQ CM/< ICD-10 Diagnosis Description L97.812 Non-pressure chronic ulcer of other part of right lower leg wit Modifier: h fat layer expo Quantity: 1 sed Physician Procedures CPT4 Code Description: 2500370 48889 - WC PHYS SUBQ TISS 20 SQ CM ICD-10 Diagnosis Description V69.450 Non-pressure chronic ulcer of other part of right lower leg wit Modifier: h fat layer expo Quantity: 1 sed Electronic Signature(s) Signed: 09/19/2018 5:17:02 PM By: Worthy Keeler PA-C Entered By: Worthy Keeler on 09/19/2018 14:27:38

## 2018-09-22 NOTE — Progress Notes (Signed)
[name], [name] ([id number]) Visit Report for 09/19/2018 Arrival Information Details Patient Name: [name], [name]. Date of Service: 09/19/2018 1:00 PM Medical Record Number: [id number] Patient Account Number: [id number] Date of Birth/Sex: 04-Sep-1966 (53 y.o. M) Treating RN: Harold Barban Primary Care Braydn Carneiro: [name] Other Clinician: Referring Jacob Cicero: [name] Treating Taniya Dasher/Extender: Melburn Hake, HOYT Weeks in Treatment: 2 Visit Information History Since Last Visit Added or deleted any medications: No Patient Arrived: Ambulatory Any new allergies or adverse reactions: No Arrival Time: 13:29 Had a fall or experienced change in No Accompanied By: self activities of daily living that may affect Transfer Assistance: None risk of falls: Patient Identification Verified: Yes Signs or symptoms of abuse/neglect since last visito No Secondary Verification Process Completed: Yes Hospitalized since last visit: No Has Dressing in Place as Prescribed: Yes Has Compression in Place as Prescribed: Yes Pain Present Now: Yes Electronic Signature(s) Signed: 09/21/2018 4:21:01 PM By: Harold Barban Entered By: Harold Barban on 09/19/2018 13:29:22 Biondo, Jonette Eva ([id number]) -------------------------------------------------------------------------------- Encounter Discharge Information Details Patient Name: [name]. Date of Service: 09/19/2018 1:00 PM Medical Record Number: [id number] Patient Account Number: [id number] Date of Birth/Sex: [birth year] (53 y.o. M) Treating RN: Montey Hora Primary Care Lincon Sahlin: [name] Other Clinician: Referring Christophr Calix: [name] Treating Sydelle Sherfield/Extender: Melburn Hake, HOYT Weeks in Treatment: 2 Encounter Discharge Information Items Post Procedure Vitals Discharge Condition: Stable Temperature (F): 98.0 Ambulatory Status: Ambulatory Pulse (bpm): 85 Discharge Destination: Home Respiratory Rate (breaths/min):  20 Transportation: Private Auto Blood Pressure (mmHg): 148/66 Accompanied By: spouse Schedule Follow-up Appointment: Yes Clinical Summary of Care: Electronic Signature(s) Signed: 09/19/2018 5:22:36 PM By: Montey Hora Entered By: Montey Hora on 09/19/2018 13:55:38 Gellatly, Jonette Eva ([id number]) -------------------------------------------------------------------------------- Lower Extremity Assessment Details Patient Name: [name]. Date of Service: 09/19/2018 1:00 PM Medical Record Number: [id number] Patient Account Number: [id number] Date of Birth/Sex: 19-Jul-1966 (53 y.o. M) Treating RN: Harold Barban Primary Care Lilienne Weins: [name] Other Clinician: Referring Perle Brickhouse: [name] Treating Jaelyn Cloninger/Extender: Melburn Hake, HOYT Weeks in Treatment: 2 Electronic Signature(s) Signed: 09/21/2018 4:21:01 PM By: Harold Barban Entered By: Harold Barban on 09/19/2018 13:38:49 Bracy, Jonette Eva ([id number]) -------------------------------------------------------------------------------- Multi Wound Chart Details Patient Name: [name]. Date of Service: 09/19/2018 1:00 PM Medical Record Number: [id number] Patient Account Number: [id number] Date of Birth/Sex: 01-11-1966 (53 y.o. M) Treating RN: Montey Hora Primary Care Emmalin Jaquess: [name] Other Clinician: Referring Krystyna Cleckley: [name] Treating Emmons Toth/Extender: STONE III, HOYT Weeks in Treatment: 2 Vital Signs Height(in): 68 Pulse(bpm): 9 Weight(lbs): 389 Blood Pressure(mmHg): 148/66 Body Mass Index(BMI): 59 Temperature(F): 98.0 Respiratory Rate 18 (breaths/min): Photos: [1:No Photos] [N/A:N/A] Wound Location: [1:Right Lower Leg - Lateral] [N/A:N/A] Wounding Event: [1:Gradually Appeared] [N/A:N/A] Primary Etiology: [1:Diabetic Wound/Ulcer of the Lower Extremity] [N/A:N/A] Comorbid History: [1:Hypertension, Type II Diabetes, Gout] [N/A:N/A] Date Acquired: [1:06/06/2018] [N/A:N/A] Weeks of  Treatment: [1:2] [N/A:N/A] Wound Status: [1:Open] [N/A:N/A] Measurements L x W x D [1:3x2.1x0.3] [N/A:N/A] (cm) Area (cm) : [1:4.948] [N/A:N/A] Volume (cm) : [1:1.484] [N/A:N/A] % Reduction in Area: [1:-26.00%] [N/A:N/A] % Reduction in Volume: [1:-277.60%] [N/A:N/A] Classification: [1:Grade 2] [N/A:N/A] Exudate Amount: [1:Large] [N/A:N/A] Exudate Type: [1:Purulent] [N/A:N/A] Exudate Color: [1:yellow, brown, green] [N/A:N/A] Wound Margin: [1:Distinct, outline attached] [N/A:N/A] Granulation Amount: [1:Small (1-33%)] [N/A:N/A] Necrotic Amount: [1:Large (67-100%)] [N/A:N/A] Necrotic Tissue: [1:Eschar, Adherent Slough] [N/A:N/A] Exposed Structures: [1:Fat Layer (Subcutaneous Tissue) Exposed: Yes Fascia: No Tendon: No Muscle: No Joint: No Bone: No] [N/A:N/A] Epithelialization: [1:None] [N/A:N/A] Periwound Skin Texture: [1:Excoriation: No Induration: No Callus: No Crepitus: No] [N/A:N/A] Rash: No Scarring: No Periwound  Skin Moisture: Dry/Scaly: Yes N/A N/A Maceration: No Periwound Skin Color: Hemosiderin Staining: Yes N/A N/A Atrophie Blanche: No Cyanosis: No Ecchymosis: No Erythema: No Mottled: No Pallor: No Rubor: No Tenderness on Palpation: Yes N/A N/A Wound Preparation: Ulcer Cleansing: N/A N/A Rinsed/Irrigated with Saline Topical Anesthetic Applied: Other: lidocaine 4% Treatment Notes Electronic Signature(s) Signed: 09/19/2018 5:22:36 PM By: Montey Hora Entered By: Montey Hora on 09/19/2018 13:45:36 Meuser, Jonette Eva ([id number]) -------------------------------------------------------------------------------- Jane Details Patient Name: [name]. Date of Service: 09/19/2018 1:00 PM Medical Record Number: [id number] Patient Account Number: [id number] Date of Birth/Sex: July 31, 1966 (53 y.o. M) Treating RN: Montey Hora Primary Care Beauden Tremont: [name] Other Clinician: Referring Jamaiyah Pyle: [name] Treating  Ixel Boehning/Extender: Melburn Hake, HOYT Weeks in Treatment: 2 Active Inactive Venous Leg Ulcer Nursing Diagnoses: Knowledge deficit related to disease process and management Goals: Patient will maintain optimal edema control Date Initiated: 09/04/2018 Target Resolution Date: 10/05/2018 Goal Status: Active Patient/caregiver will verbalize understanding of disease process and disease management Date Initiated: 09/04/2018 Target Resolution Date: 10/05/2018 Goal Status: Active Interventions: Provide education on venous insufficiency Notes: Wound/Skin Impairment Nursing Diagnoses: Impaired tissue integrity Goals: Ulcer/skin breakdown will have a volume reduction of 30% by week 4 Date Initiated: 09/04/2018 Target Resolution Date: 10/05/2018 Goal Status: Active Interventions: Assess patient/caregiver ability to obtain necessary supplies Assess patient/caregiver ability to perform ulcer/skin care regimen upon admission and as needed Notes: Electronic Signature(s) Signed: 09/19/2018 5:22:36 PM By: Montey Hora Entered By: Montey Hora on 09/19/2018 13:45:28 Crook, Jonette Eva ([id number]) -------------------------------------------------------------------------------- Pain Assessment Details Patient Name: [name]. Date of Service: 09/19/2018 1:00 PM Medical Record Number: [id number] Patient Account Number: [id number] Date of Birth/Sex: 1966/07/05 (53 y.o. M) Treating RN: Harold Barban Primary Care Khari Mally: [name] Other Clinician: Referring Wheeler Incorvaia: [name] Treating Jaskarn Schweer/Extender: Melburn Hake, HOYT Weeks in Treatment: 2 Active Problems Location of Pain Severity and Description of Pain Patient Has Paino Yes Site Locations Pain Location: Generalized Pain With Dressing Change: Yes Rate the pain. Current Pain Level: 4 Pain Management and Medication Current Pain Management: Electronic Signature(s) Signed: 09/21/2018 4:21:01 PM By: Harold Barban Entered By:  Harold Barban on 09/19/2018 13:30:00 Baine, Jonette Eva ([id number]) -------------------------------------------------------------------------------- Patient/Caregiver Education Details Patient Name: [name]. Date of Service: 09/19/2018 1:00 PM Medical Record Number: [id number] Patient Account Number: [id number] Date of Birth/Gender: 05/04/1966 (53 y.o. M) Treating RN: Montey Hora Primary Care Physician: [name] Other Clinician: Referring Physician: [name] Treating Physician/Extender: Sharalyn Ink in Treatment: 2 Education Assessment Education Provided To: Patient and Caregiver Education Topics Provided Wound/Skin Impairment: Handouts: Other: wound care as ordered Methods: Demonstration, Explain/Verbal Responses: State content correctly Electronic Signature(s) Signed: 09/19/2018 5:22:36 PM By: Montey Hora Entered By: Montey Hora on 09/19/2018 13:55:57 Degidio, Jonette Eva ([id number]) -------------------------------------------------------------------------------- Wound Assessment Details Patient Name: [name]. Date of Service: 09/19/2018 1:00 PM Medical Record Number: [id number] Patient Account Number: [id number] Date of Birth/Sex: [birth year] (53 y.o. M) Treating RN: Harold Barban Primary Care Cortasia Screws: [name] Other Clinician: Referring Lucrecia Mcphearson: [name] Treating Tennis Mckinnon/Extender: STONE III, HOYT Weeks in Treatment: 2 Wound Status Wound Number: 1 Primary Etiology: Diabetic Wound/Ulcer of the Lower Extremity Wound Location: Right Lower Leg - Lateral Wound Status: Open Wounding Event: Gradually Appeared Comorbid Hypertension, Type II Diabetes, Gout Date Acquired: 06/06/2018 History: Weeks Of Treatment: 2 Clustered Wound: No Photos Photo Uploaded By: Harold Barban on 09/19/2018 16:29:16 Wound Measurements Length: (cm) 3 Width: (cm) 2.1 Depth: (cm) 0.3 Area: (cm) 4.948 Volume: (cm) 1.484 % Reduction  in Area:  -26% % Reduction in Volume: -277.6% Epithelialization: None Tunneling: No Undermining: No Wound Description Classification: Grade 2 Wound Margin: Distinct, outline attached Exudate Amount: Large Exudate Type: Purulent Exudate Color: yellow, brown, green Foul Odor After Cleansing: No Slough/Fibrino Yes Wound Bed Granulation Amount: Small (1-33%) Exposed Structure Necrotic Amount: Large (67-100%) Fascia Exposed: No Necrotic Quality: Eschar, Adherent Slough Fat Layer (Subcutaneous Tissue) Exposed: Yes Tendon Exposed: No Muscle Exposed: No Joint Exposed: No Bone Exposed: No Periwound Skin Texture Gaubert, Kristofer Z. ([id number]) Texture Color No Abnormalities Noted: No No Abnormalities Noted: No Callus: No Atrophie Blanche: No Crepitus: No Cyanosis: No Excoriation: No Ecchymosis: No Induration: No Erythema: No Rash: No Hemosiderin Staining: Yes Scarring: No Mottled: No Pallor: No Moisture Rubor: No No Abnormalities Noted: No Dry / Scaly: Yes Temperature / Pain Maceration: No Tenderness on Palpation: Yes Wound Preparation Ulcer Cleansing: Rinsed/Irrigated with Saline Topical Anesthetic Applied: Other: lidocaine 4%, Treatment Notes Wound #1 (Right, Lateral Lower Leg) Notes prisma, BFD, tubigrip G Electronic Signature(s) Signed: 09/21/2018 4:21:01 PM By: Harold Barban Entered By: Harold Barban on 09/19/2018 13:37:28 Lungren, Jonette Eva ([id number]) -------------------------------------------------------------------------------- Vitals Details Patient Name: [name]. Date of Service: 09/19/2018 1:00 PM Medical Record Number: [id number] Patient Account Number: [id number] Date of Birth/Sex: [birth year] (53 y.o. M) Treating RN: Harold Barban Primary Care Devonne Lalani: [name] Other Clinician: Referring Honor Frison: [name] Treating Ernestyne Caldwell/Extender: Melburn Hake, HOYT Weeks in Treatment: 2 Vital Signs Time Taken: 13:30 Temperature (F): 98.0 Height (in):  68 Pulse (bpm): 85 Weight (lbs): 389 Respiratory Rate (breaths/min): 18 Body Mass Index (BMI): 59.1 Blood Pressure (mmHg): 148/66 Reference Range: 80 - 120 mg / dl Electronic Signature(s) Signed: 09/21/2018 4:21:01 PM By: Harold Barban Entered By: Harold Barban on 09/19/2018 13:30:19

## 2018-09-26 ENCOUNTER — Ambulatory Visit: Payer: Self-pay | Admitting: Physician Assistant

## 2018-09-28 ENCOUNTER — Encounter: Payer: Self-pay | Admitting: Physician Assistant

## 2018-10-01 NOTE — Progress Notes (Signed)
WILMON, CONOVER ([id number]) Visit Report for 09/28/2018 Arrival Information Details Patient Name: [name], [name]. Date of Service: 09/28/2018 9:45 AM Medical Record Number: [id number] Patient Account Number: [id number] Date of Birth/Sex: [birth year] (53 y.o. Male) Treating RN: Secundino Ginger Primary Care Casmir Auguste: [name] Other Clinician: Referring Thoms Barthelemy: [name] Treating Avyukth Bontempo/Extender: Melburn Hake, HOYT Weeks in Treatment: 3 Visit Information History Since Last Visit Added or deleted any medications: No Patient Arrived: Ambulatory Any new allergies or adverse reactions: No Arrival Time: 10:22 Had a fall or experienced change in No Accompanied By: self activities of daily living that may affect Transfer Assistance: None risk of falls: Patient Identification Verified: Yes Signs or symptoms of abuse/neglect since last visito No Secondary Verification Process Completed: Yes Hospitalized since last visit: No Implantable device outside of the clinic excluding No cellular tissue based products placed in the center since last visit: Has Dressing in Place as Prescribed: Yes Pain Present Now: No Electronic Signature(s) Signed: 09/28/2018 4:33:03 PM By: Secundino Ginger Entered By: Secundino Ginger on 09/28/2018 10:23:16 [name], [name] ([id number]) -------------------------------------------------------------------------------- Encounter Discharge Information Details Patient Name: [name]. Date of Service: 09/28/2018 9:45 AM Medical Record Number: [id number] Patient Account Number: [id number] Date of Birth/Sex: Jul 04, 1966 (53 y.o. Male) Treating RN: Cornell Barman Primary Care Jenafer Winterton: [name] Other Clinician: Referring Advit Trethewey: [name] Treating Phu Record/Extender: Melburn Hake, HOYT Weeks in Treatment: 3 Encounter Discharge Information Items Post Procedure Vitals Discharge Condition: Stable Temperature (F): 97.8 Ambulatory Status: Ambulatory Pulse (bpm): 89 Discharge  Destination: Home Respiratory Rate (breaths/min): 16 Transportation: Private Auto Blood Pressure (mmHg): 180/72 Accompanied By: self Schedule Follow-up Appointment: Yes Clinical Summary of Care: Electronic Signature(s) Signed: 09/28/2018 5:43:44 PM By: Gretta Cool, BSN, RN, CWS, Kim RN, BSN Entered By: Gretta Cool, BSN, RN, CWS, Kim on 09/28/2018 10:50:22 [name], [name] ([id number]) -------------------------------------------------------------------------------- Lower Extremity Assessment Details Patient Name: [name]. Date of Service: 09/28/2018 9:45 AM Medical Record Number: [id number] Patient Account Number: [id number] Date of Birth/Sex: 08-31-1966 (53 y.o. Male) Treating RN: Secundino Ginger Primary Care Sadey Yandell: [name] Other Clinician: Referring Jasara Corrigan: [name] Treating Deonna Krummel/Extender: Melburn Hake, HOYT Weeks in Treatment: 3 Edema Assessment Assessed: [Left: No] [Right: No] [Left: Edema] [Right: :] Calf Left: Right: Point of Measurement: 34 cm From Medial Instep cm 46 cm Ankle Left: Right: Point of Measurement: 15 cm From Medial Instep cm 29 cm Vascular Assessment Claudication: Claudication Assessment [Right:None] Pulses: Dorsalis Pedis Palpable: [Right:Yes] Posterior Tibial Extremity colors, hair growth, and conditions: Extremity Color: [Right:Normal] Hair Growth on Extremity: [Right:No] Temperature of Extremity: [Right:Cool] Capillary Refill: [Right:< 3 seconds] Toe Nail Assessment Left: Right: Thick: Yes Discolored: Yes Deformed: Yes Improper Length and Hygiene: Yes Electronic Signature(s) Signed: 09/28/2018 4:33:03 PM By: Secundino Ginger Entered By: Secundino Ginger on 09/28/2018 10:33:11 [name], [name] ([id number]) -------------------------------------------------------------------------------- Multi Wound Chart Details Patient Name: [name]. Date of Service: 09/28/2018 9:45 AM Medical Record Number: [id number] Patient Account Number: [id number] Date of  Birth/Sex: 07-17-1966 (53 y.o. Male) Treating RN: Cornell Barman Primary Care Nikhil Osei: [name] Other Clinician: Referring Dilcia Rybarczyk: [name] Treating Kla Bily/Extender: Melburn Hake, HOYT Weeks in Treatment: 3 Vital Signs Height(in): 2 Pulse(bpm): 60 Weight(lbs): 389 Blood Pressure(mmHg): 180/72 Body Mass Index(BMI): 59 Temperature(F): 97.8 Respiratory Rate 18 (breaths/min): Photos: [N/A:N/A] Wound Location: Right Lower Leg - Lateral N/A N/A Wounding Event: Gradually Appeared N/A N/A Primary Etiology: Diabetic Wound/Ulcer of the N/A N/A Lower Extremity Comorbid History: Hypertension, Type II N/A N/A Diabetes, Gout Date Acquired: 06/06/2018 N/A N/A Weeks of Treatment: 3 N/A  N/A Wound Status: Open N/A N/A Measurements L x W x D 2.8x0.9x0.2 N/A N/A (cm) Area (cm) : 1.979 N/A N/A Volume (cm) : 0.396 N/A N/A % Reduction in Area: 49.60% N/A N/A % Reduction in Volume: -0.80% N/A N/A Classification: Grade 2 N/A N/A Exudate Amount: Medium N/A N/A Exudate Type: Purulent N/A N/A Exudate Color: yellow, brown, green N/A N/A Wound Margin: Distinct, outline attached N/A N/A Granulation Amount: Small (1-33%) N/A N/A Necrotic Amount: Large (67-100%) N/A N/A Necrotic Tissue: Eschar, Adherent Slough N/A N/A Exposed Structures: Fat Layer (Subcutaneous N/A N/A Tissue) Exposed: Yes Fascia: No Tendon: No Muscle: No Sullenberger, Remo Z. ([id number]) Joint: No Bone: No Epithelialization: None N/A N/A Periwound Skin Texture: Excoriation: No N/A N/A Induration: No Callus: No Crepitus: No Rash: No Scarring: No Periwound Skin Moisture: Dry/Scaly: Yes N/A N/A Maceration: No Periwound Skin Color: Hemosiderin Staining: Yes N/A N/A Atrophie Blanche: No Cyanosis: No Ecchymosis: No Erythema: No Mottled: No Pallor: No Rubor: No Tenderness on Palpation: Yes N/A N/A Wound Preparation: Ulcer Cleansing: N/A N/A Rinsed/Irrigated with Saline Topical Anesthetic Applied: Other:  lidocaine 4% Treatment Notes Electronic Signature(s) Signed: 09/28/2018 5:43:44 PM By: Gretta Cool, BSN, RN, CWS, Kim RN, BSN Entered By: Gretta Cool, BSN, RN, CWS, Kim on 09/28/2018 10:43:49 [name], [name] ([id number]) -------------------------------------------------------------------------------- [place] Details Patient Name: [name]. Date of Service: 09/28/2018 9:45 AM Medical Record Number: [id number] Patient Account Number: [id number] Date of Birth/Sex: 08-Sep-1965 (53 y.o. Male) Treating RN: Cornell Barman Primary Care Kobe Jansma: [name] Other Clinician: Referring Abbie Berling: [name] Treating Diona Peregoy/Extender: Melburn Hake, HOYT Weeks in Treatment: 3 Active Inactive Venous Leg Ulcer Nursing Diagnoses: Knowledge deficit related to disease process and management Goals: Patient will maintain optimal edema control Date Initiated: 09/04/2018 Target Resolution Date: 10/05/2018 Goal Status: Active Patient/caregiver will verbalize understanding of disease process and disease management Date Initiated: 09/04/2018 Target Resolution Date: 10/05/2018 Goal Status: Active Interventions: Provide education on venous insufficiency Notes: Wound/Skin Impairment Nursing Diagnoses: Impaired tissue integrity Goals: Ulcer/skin breakdown will have a volume reduction of 30% by week 4 Date Initiated: 09/04/2018 Target Resolution Date: 10/05/2018 Goal Status: Active Interventions: Assess patient/caregiver ability to obtain necessary supplies Assess patient/caregiver ability to perform ulcer/skin care regimen upon admission and as needed Notes: Electronic Signature(s) Signed: 09/28/2018 5:43:44 PM By: Gretta Cool, BSN, RN, CWS, Kim RN, BSN Entered By: Gretta Cool, BSN, RN, CWS, Kim on 09/28/2018 10:43:33 [name], [name] ([id number]) -------------------------------------------------------------------------------- Pain Assessment Details Patient Name: [name]. Date of Service:  09/28/2018 9:45 AM Medical Record Number: [id number] Patient Account Number: [id number] Date of Birth/Sex: 14-Feb-1966 (53 y.o. Male) Treating RN: Secundino Ginger Primary Care Keatyn Jawad: [name] Other Clinician: Referring Kapena Hamme: [name] Treating Joie Reamer/Extender: Melburn Hake, HOYT Weeks in Treatment: 3 Active Problems Location of Pain Severity and Description of Pain Patient Has Paino No Site Locations Pain Management and Medication Current Pain Management: Notes pt denies any pain at this time. Electronic Signature(s) Signed: 09/28/2018 4:33:03 PM By: Secundino Ginger Entered By: Secundino Ginger on 09/28/2018 10:23:39 [name], [name] ([id number]) -------------------------------------------------------------------------------- Patient/Caregiver Education Details Patient Name: [name]. Date of Service: 09/28/2018 9:45 AM Medical Record Number: [id number] Patient Account Number: [id number] Date of Birth/Gender: [birth year] (53 y.o. Male) Treating RN: Cornell Barman Primary Care Physician: [name] Other Clinician: Referring Physician: [name] Treating Physician/Extender: Sharalyn Ink in Treatment: 3 Education Assessment Education Provided To: Patient Education Topics Provided Wound/Skin Impairment: Handouts: Caring for Your Ulcer, Other: continue wound care as prescribed Methods: Demonstration, Explain/Verbal Responses: State  content correctly Electronic Signature(s) Signed: 09/28/2018 5:43:44 PM By: Gretta Cool, BSN, RN, CWS, Kim RN, BSN Entered By: Gretta Cool, BSN, RN, CWS, Kim on 09/28/2018 10:46:57 [name], [name] ([id number]) -------------------------------------------------------------------------------- Wound Assessment Details Patient Name: [name]. Date of Service: 09/28/2018 9:45 AM Medical Record Number: [id number] Patient Account Number: [id number] Date of Birth/Sex: 1966/08/26 (53 y.o. Male) Treating RN: Secundino Ginger Primary Care Breandan People: [name]  Other Clinician: Referring Sabrie Moritz: [name] Treating Damien Cisar/Extender: Melburn Hake, HOYT Weeks in Treatment: 3 Wound Status Wound Number: 1 Primary Etiology: Diabetic Wound/Ulcer of the Lower Extremity Wound Location: Right Lower Leg - Lateral Wound Status: Open Wounding Event: Gradually Appeared Comorbid Hypertension, Type II Diabetes, Gout Date Acquired: 06/06/2018 History: Weeks Of Treatment: 3 Clustered Wound: No Photos Photo Uploaded By: Secundino Ginger on 09/28/2018 10:37:41 Wound Measurements Length: (cm) 2.8 % Reduction Width: (cm) 0.9 % Reduction Depth: (cm) 0.2 Epitheliali Area: (cm) 1.979 Tunneling: Volume: (cm) 0.396 Underminin in Area: 49.6% in Volume: -0.8% zation: None No g: No Wound Description Classification: Grade 2 Foul Odor A Wound Margin: Distinct, outline attached Slough/Fibr Exudate Amount: Medium Exudate Type: Purulent Exudate Color: yellow, brown, green fter Cleansing: No ino Yes Wound Bed Granulation Amount: Small (1-33%) Exposed Structure Necrotic Amount: Large (67-100%) Fascia Exposed: No Necrotic Quality: Eschar, Adherent Slough Fat Layer (Subcutaneous Tissue) Exposed: Yes Tendon Exposed: No Muscle Exposed: No Joint Exposed: No Bone Exposed: No Periwound Skin Texture Marcotte, [name] ([id number]) Texture Color No Abnormalities Noted: No No Abnormalities Noted: No Callus: No Atrophie Blanche: No Crepitus: No Cyanosis: No Excoriation: No Ecchymosis: No Induration: No Erythema: No Rash: No Hemosiderin Staining: Yes Scarring: No Mottled: No Pallor: No Moisture Rubor: No No Abnormalities Noted: No Dry / Scaly: Yes Temperature / Pain Maceration: No Tenderness on Palpation: Yes Wound Preparation Ulcer Cleansing: Rinsed/Irrigated with Saline Topical Anesthetic Applied: Other: lidocaine 4%, Treatment Notes Wound #1 (Right, Lateral Lower Leg) Notes prisma, BFD Electronic Signature(s) Signed: 09/28/2018 4:33:03 PM By:  Secundino Ginger Entered By: Secundino Ginger on 09/28/2018 10:31:00 [name], [name] ([id number]) -------------------------------------------------------------------------------- Vitals Details Patient Name: [name]. Date of Service: 09/28/2018 9:45 AM Medical Record Number: [id number] Patient Account Number: [id number] Date of Birth/Sex: 1966/01/17 (53 y.o. Male) Treating RN: Secundino Ginger Primary Care Alveda Vanhorne: [name] Other Clinician: Referring Daleon Willinger: [name] Treating Loren Sawaya/Extender: Melburn Hake, HOYT Weeks in Treatment: 3 Vital Signs Time Taken: 10:23 Temperature (F): 97.8 Height (in): 68 Pulse (bpm): 89 Weight (lbs): 389 Respiratory Rate (breaths/min): 18 Body Mass Index (BMI): 59.1 Blood Pressure (mmHg): 180/72 Reference Range: 80 - 120 mg / dl Airway Electronic Signature(s) Signed: 09/28/2018 4:33:03 PM By: Secundino Ginger Entered By: Secundino Ginger on 09/28/2018 10:25:50

## 2018-10-01 NOTE — Progress Notes (Signed)
[name] ([id number]) Visit Report for 09/28/2018 Chief Complaint Document Details Patient Name: [name], [name]. Date of Service: 09/28/2018 9:45 AM Medical Record Number: [id number] Patient Account Number: [id number] Date of Birth/Sex: 06-06-1966 (53 y.o. Male) Treating RN: Cornell Barman Primary Care Provider: [name] Other Clinician: Referring Provider: [name] Treating Provider/Extender: Melburn Hake, Lavette Yankovich Weeks in Treatment: 3 Information Obtained from: Patient Chief Complaint Right lower leg ulcer Electronic Signature(s) Signed: 09/30/2018 2:36:46 AM By: Worthy Keeler PA-C Entered By: Worthy Keeler on 09/28/2018 10:33:33 [name], [name] ([id number]) -------------------------------------------------------------------------------- Debridement Details Patient Name: [name]. Date of Service: 09/28/2018 9:45 AM Medical Record Number: [id number] Patient Account Number: [id number] Date of Birth/Sex: 13-Dec-1965 (53 y.o. Male) Treating RN: Cornell Barman Primary Care Provider: [name] Other Clinician: Referring Provider: [name] Treating Provider/Extender: Melburn Hake, Welford Christmas Weeks in Treatment: 3 Debridement Performed for Wound #1 Right,Lateral Lower Leg Assessment: Performed By: Physician STONE III, Jia Mohamed E., PA-C Debridement Type: Debridement Severity of Tissue Pre Fat layer exposed Debridement: Level of Consciousness (Pre- Awake and Alert procedure): Pre-procedure Verification/Time Yes - 10:42 Out Taken: Start Time: 10:42 Pain Control: Lidocaine Total Area Debrided (L x W): 1 (cm) x 0.9 (cm) = 0.9 (cm) Tissue and other material Viable, Non-Viable, Slough, Subcutaneous, Fibrin/Exudate, Slough debrided: Level: Skin/Subcutaneous Tissue Debridement Description: Excisional Instrument: Curette Bleeding: Minimum Hemostasis Achieved: Pressure End Time: 10:45 Response to Treatment: Procedure was tolerated well Level of Consciousness Awake and  Alert (Post-procedure): Post Debridement Measurements of Total Wound Length: (cm) 2.8 Width: (cm) 0.9 Depth: (cm) 0.2 Volume: (cm) 0.396 Character of Wound/Ulcer Post Debridement: Requires Further Debridement Severity of Tissue Post Debridement: Fat layer exposed Post Procedure Diagnosis Same as Pre-procedure Electronic Signature(s) Signed: 09/28/2018 5:43:44 PM By: Gretta Cool, BSN, RN, CWS, Kim RN, BSN Signed: 09/30/2018 2:36:46 AM By: Worthy Keeler PA-C Entered By: Gretta Cool, BSN, RN, CWS, Kim on 09/28/2018 10:45:51 [name], [name] ([id number]) -------------------------------------------------------------------------------- HPI Details Patient Name: [name]. Date of Service: 09/28/2018 9:45 AM Medical Record Number: [id number] Patient Account Number: [id number] Date of Birth/Sex: [birth year] (53 y.o. Male) Treating RN: Cornell Barman Primary Care Provider: [name] Other Clinician: Referring Provider: [name] Treating Provider/Extender: Melburn Hake, Marlisha Vanwyk Weeks in Treatment: 3 History of Present Illness HPI Description: 53 year old male with past medical history of diabetes type 2, HTN, obesity, former smoker, and chronic narcotic use. Present today for right lower leg ulcer. He states that he first noticed wound back in October 2019. At that time he felt that he was bitten by something. There was no OTC treatment. Over time the wound became larger with increased pain. He was seen by [facility] and started on Keflex antibiotics for cellulitis for 14 days. Finished abx 2-3 days ago. Chronic bilateral knee pain. HA1C in November 2019 was 9.6. He is independent of ADLs however, he is unable to reach the right leg wound area. Left ABI in clinic is 0.94, right is non-compressible. 09/11/17 on evaluation today patient has a wound over the right lower extremity which appears to be extremely painful for him. He states at this time that the pain is a 10 out of 10 currently.  Nonetheless there does not appear to be any signs of infection. He still does not have any insurance at this point and therefore a vascular evaluation is not possible he tells me. Obviously I do think that with his right lower extremity be noncompressible we really need to have vascular evaluation order to  consider any type of wrap or compressive therapy. 09/19/18 on evaluation today patient appears to be doing a little better in regard to his right lateral lower Trinity ulcer. He has been tolerating the dressing changes without complication. Fortunately there is no sign of infection at this time. He has been tolerating the dressing changes without complication. He does have significant fluid buildup of the right lower extremity but at this point no wraps are being performed as he was noncompressible during initial evaluation. He also has not wanted [place] to proceed with a vascular referral for arterial studies due to cost. He does not currently have insurance. 09/28/18 on evaluation today patient appears to be doing well in regard to his right lateral lower extremity ulcer. He's been tolerating the dressing changes without complication. Fortunately there is good signs of improvement which is excellent. Electronic Signature(s) Signed: 09/30/2018 2:36:46 AM By: Worthy Keeler PA-C Entered By: Worthy Keeler on 09/28/2018 11:12:20 [name], [name] ([id number]) -------------------------------------------------------------------------------- Physical Exam Details Patient Name: [name]. Date of Service: 09/28/2018 9:45 AM Medical Record Number: [id number] Patient Account Number: [id number] Date of Birth/Sex: 01/20/1966 (53 y.o. Male) Treating RN: Cornell Barman Primary Care Provider: [name] Other Clinician: Referring Provider: [name] Treating Provider/Extender: STONE III, Akito Boomhower Weeks in Treatment: 3 Constitutional Well-nourished and well-hydrated in no acute  distress. Respiratory normal breathing without difficulty. Psychiatric this patient is able to make decisions and demonstrates good insight into disease process. Alert and Oriented x 3. pleasant and cooperative. Electronic Signature(s) Signed: 09/30/2018 2:36:46 AM By: Worthy Keeler PA-C Entered By: Worthy Keeler on 09/28/2018 11:13:18 [name], [name] ([id number]) -------------------------------------------------------------------------------- Physician Orders Details Patient Name: [name]. Date of Service: 09/28/2018 9:45 AM Medical Record Number: [id number] Patient Account Number: [id number] Date of Birth/Sex: Apr 19, 1966 (53 y.o. Male) Treating RN: Cornell Barman Primary Care Provider: [name] Other Clinician: Referring Provider: [name] Treating Provider/Extender: Melburn Hake, [name] Weeks in Treatment: 3 Verbal / Phone Orders: No Diagnosis Coding ICD-10 Coding Code Description E11.622 Type 2 diabetes mellitus with other skin ulcer L97.812 Non-pressure chronic ulcer of other part of right lower leg with fat layer exposed I10 Essential (primary) hypertension E66.01 Morbid (severe) obesity due to excess calories Wound Cleansing Wound #1 Right,Lateral Lower Leg o May Shower, gently pat wound dry prior to applying new dressing. Anesthetic (add to Medication List) Wound #1 Right,Lateral Lower Leg o Topical Lidocaine 4% cream applied to wound bed prior to debridement (In Clinic Only). Primary Wound Dressing Wound #1 Right,Lateral Lower Leg o Silver Collagen Secondary Dressing o Boardered Foam Dressing Dressing Change Frequency Wound #1 Right,Lateral Lower Leg o Change dressing every other day. Follow-up Appointments Wound #1 Right,Lateral Lower Leg o Return Appointment in 1 week. Electronic Signature(s) Signed: 09/28/2018 5:43:44 PM By: Gretta Cool, BSN, RN, CWS, Kim RN, BSN Signed: 09/30/2018 2:36:46 AM By: Worthy Keeler PA-C Entered By: Gretta Cool BSN,  RN, CWS, Kim on 09/28/2018 10:46:21 [name], [name] ([id number]) -------------------------------------------------------------------------------- Problem List Details Patient Name: [name]. Date of Service: 09/28/2018 9:45 AM Medical Record Number: [id number] Patient Account Number: [id number] Date of Birth/Sex: 11-Apr-1966 (53 y.o. Male) Treating RN: Cornell Barman Primary Care Provider: [name] Other Clinician: Referring Provider: [name] Treating Provider/Extender: Melburn Hake, Earl Zellmer Weeks in Treatment: 3 Active Problems ICD-10 Evaluated Encounter Code Description Active Date Today Diagnosis E11.622 Type 2 diabetes mellitus with other skin ulcer 09/04/2018 No Yes L97.812 Non-pressure chronic ulcer of other part of right lower leg 09/04/2018 No Yes  with fat layer exposed [place] (primary) hypertension 09/04/2018 No Yes E66.01 Morbid (severe) obesity due to excess calories 09/04/2018 No Yes Inactive Problems Resolved Problems Electronic Signature(s) Signed: 09/30/2018 2:36:46 AM By: Worthy Keeler PA-C Entered By: Worthy Keeler on 09/28/2018 10:33:29 [name], [name] ([id number]) -------------------------------------------------------------------------------- Progress Note Details Patient Name: [name]. Date of Service: 09/28/2018 9:45 AM Medical Record Number: [id number] Patient Account Number: [id number] Date of Birth/Sex: 02-15-1966 (53 y.o. Male) Treating RN: Cornell Barman Primary Care Provider: [name] Other Clinician: Referring Provider: [name] Treating Provider/Extender: Melburn Hake, Ronalee Scheunemann Weeks in Treatment: 3 Subjective Chief Complaint Information obtained from Patient Right lower leg ulcer History of Present Illness (HPI) 53 year old male with past medical history of diabetes type 2, HTN, obesity, former smoker, and chronic narcotic use. Present today for right lower leg ulcer. He states that he first noticed wound back in October  2019. At that time he felt that he was bitten by something. There was no OTC treatment. Over time the wound became larger with increased pain. He was seen by [facility] urgent care and started on Keflex antibiotics for cellulitis for 14 days. Finished abx 2-3 days ago. Chronic bilateral knee pain. HA1C in November 2019 was 9.6. He is independent of ADLs however, he is unable to reach the right leg wound area. Left ABI in clinic is 0.94, right is non-compressible. 09/11/17 on evaluation today patient has a wound over the right lower extremity which appears to be extremely painful for him. He states at this time that the pain is a 10 out of 10 currently. Nonetheless there does not appear to be any signs of infection. He still does not have any insurance at this point and therefore a vascular evaluation is not possible he tells me. Obviously I do think that with his right lower extremity be noncompressible we really need to have vascular evaluation order to consider any type of wrap or compressive therapy. 09/19/18 on evaluation today patient appears to be doing a little better in regard to his right lateral lower Trinity ulcer. He has been tolerating the dressing changes without complication. Fortunately there is no sign of infection at this time. He has been tolerating the dressing changes without complication. He does have significant fluid buildup of the right lower extremity but at this point no wraps are being performed as he was noncompressible during initial evaluation. He also has not wanted [place] to proceed with a vascular referral for arterial studies due to cost. He does not currently have insurance. 09/28/18 on evaluation today patient appears to be doing well in regard to his right lateral lower extremity ulcer. He's been tolerating the dressing changes without complication. Fortunately there is good signs of improvement which is excellent. Patient History Information obtained from  Patient. Social History Former smoker - started on 09/15/2002 - ended on 09/06/2012, Marital Status - Single, Alcohol Use - Never, Drug Use - No History, Caffeine Use - Daily - sodas. Medical And Surgical History Notes Musculoskeletal Arthritis Review of Systems (ROS) Constitutional Symptoms (General Health) Denies complaints or symptoms of Fatigue, Fever, Chills. Respiratory The patient has no complaints or symptoms. Cardiovascular The patient has no complaints or symptoms. [name], [name] ([id number]) Psychiatric The patient has no complaints or symptoms. Objective Constitutional Well-nourished and well-hydrated in no acute distress. Vitals Time Taken: 10:23 AM, Height: 68 in, Weight: 389 lbs, BMI: 59.1, Temperature: 97.8 F, Pulse: 89 bpm, Respiratory Rate: 18 breaths/min, Blood Pressure: 180/72 mmHg.  Respiratory normal breathing without difficulty. Psychiatric this patient is able to make decisions and demonstrates good insight into disease process. Alert and Oriented x 3. pleasant and cooperative. Integumentary (Hair, Skin) Wound #1 status is Open. Original cause of wound was Gradually Appeared. The wound is located on the Right,Lateral Lower Leg. The wound measures 2.8cm length x 0.9cm width x 0.2cm depth; 1.979cm^2 area and 0.396cm^3 volume. There is Fat Layer (Subcutaneous Tissue) Exposed exposed. There is no tunneling or undermining noted. There is a medium amount of purulent drainage noted. The wound margin is distinct with the outline attached to the wound base. There is small (1-33%) granulation within the wound bed. There is a large (67-100%) amount of necrotic tissue within the wound bed including Eschar and Adherent Slough. The periwound skin appearance exhibited: Dry/Scaly, Hemosiderin Staining. The periwound skin appearance did not exhibit: Callus, Crepitus, Excoriation, Induration, Rash, Scarring, Maceration, Atrophie Blanche, Cyanosis, Ecchymosis, Mottled, Pallor,  Rubor, Erythema. The periwound has tenderness on palpation. Assessment Active Problems ICD-10 Type 2 diabetes mellitus with other skin ulcer Non-pressure chronic ulcer of other part of right lower leg with fat layer exposed Essential (primary) hypertension Morbid (severe) obesity due to excess calories Procedures [name], [name] ([id number]) Wound #1 Pre-procedure diagnosis of Wound #1 is a Diabetic Wound/Ulcer of the Lower Extremity located on the Right,Lateral Lower Leg .Severity of Tissue Pre Debridement is: Fat layer exposed. There was a Excisional Skin/Subcutaneous Tissue Debridement with a total area of 0.9 sq cm performed by STONE III, Ziaire Bieser E., PA-C. With the following instrument(s): Curette to remove Viable and Non-Viable tissue/material. Material removed includes Subcutaneous Tissue, Slough, and Fibrin/Exudate after achieving pain control using Lidocaine. No specimens were taken. A time out was conducted at 10:42, prior to the start of the procedure. A Minimum amount of bleeding was controlled with Pressure. The procedure was tolerated well. Post Debridement Measurements: 2.8cm length x 0.9cm width x 0.2cm depth; 0.396cm^3 volume. Character of Wound/Ulcer Post Debridement requires further debridement. Severity of Tissue Post Debridement is: Fat layer exposed. Post procedure Diagnosis Wound #1: Same as Pre-Procedure Plan Wound Cleansing: Wound #1 Right,Lateral Lower Leg: May Shower, gently pat wound dry prior to applying new dressing. Anesthetic (add to Medication List): Wound #1 Right,Lateral Lower Leg: Topical Lidocaine 4% cream applied to wound bed prior to debridement (In Clinic Only). Primary Wound Dressing: Wound #1 Right,Lateral Lower Leg: Silver Collagen Secondary Dressing: Boardered Foam Dressing Dressing Change Frequency: Wound #1 Right,Lateral Lower Leg: Change dressing every other day. Follow-up Appointments: Wound #1 Right,Lateral Lower Leg: Return Appointment  in 1 week. I'm gonna recommend that we continue with the above wound care measures for the next week. Patient is in agreement with plan. Please see above for specific wound care orders. We will see patient for re-evaluation in 1 week(s) here in the clinic. If anything worsens or changes patient will contact our office for additional recommendations. Electronic Signature(s) Signed: 09/30/2018 2:36:46 AM By: Worthy Keeler PA-C Entered By: Worthy Keeler on 09/28/2018 11:13:27 [name], [name] ([id number]) -------------------------------------------------------------------------------- ROS/PFSH Details Patient Name: [name]. Date of Service: 09/28/2018 9:45 AM Medical Record Number: [id number] Patient Account Number: [id number] Date of Birth/Sex: 05-10-1966 (53 y.o. Male) Treating RN: Cornell Barman Primary Care Provider: [name] Other Clinician: Referring Provider: [name] Treating Provider/Extender: Melburn Hake, Kelaiah Escalona Weeks in Treatment: 3 Information Obtained From Patient Wound History Do you currently have one or more open woundso Yes How many open wounds do you currently haveo 1 Approximately how  long have you had your woundso 06/06/2018 How have you been treating your wound(s) until nowo antibiotic(Keflex), soap and water Has your wound(s) ever healed and then re-openedo No Have you had any lab work done in the past montho No Have you tested positive for an antibiotic resistant organism (MRSA, VRE)o No Have you tested positive for osteomyelitis (bone infection)o No Have you had any tests for circulation on your legso No Have you had other problems associated with your woundso Swelling Constitutional Symptoms (General Health) Complaints and Symptoms: Negative for: Fatigue; Fever; Chills Respiratory Complaints and Symptoms: No Complaints or Symptoms Cardiovascular Complaints and Symptoms: No Complaints or Symptoms Medical History: Positive for:  Hypertension Endocrine Medical History: Positive for: Type II Diabetes - DX age 83A1C 9.6 Time with diabetes: 3 years Treated with: Insulin, Oral agents Blood sugar tested every day: Yes Tested : Genitourinary Medical History: Negative for: End Stage Renal Disease Musculoskeletal TREVIONE, WERT ([id number]) Medical History: Positive for: Gout Past Medical History Notes: Arthritis Oncologic Medical History: Negative for: Received Chemotherapy; Received Radiation Psychiatric Complaints and Symptoms: No Complaints or Symptoms Immunizations Pneumococcal Vaccine: Received Pneumococcal Vaccination: No Implantable Devices Family and Social History Former smoker - started on 09/15/2002 - ended on 09/06/2012; Marital Status - Single; Alcohol Use: Never; Drug Use: No History; Caffeine Use: Daily - sodas; Financial Concerns: No; Food, Clothing or Shelter Needs: No; Support System Lacking: No; Transportation Concerns: No; Advanced Directives: No; Patient does not want information on Advanced Directives; Living Will: No; Medical Power of Attorney: No Physician Affirmation I have reviewed and agree with the above information. Electronic Signature(s) Signed: 09/28/2018 5:43:44 PM By: Gretta Cool, BSN, RN, CWS, Kim RN, BSN Signed: 09/30/2018 2:36:46 AM By: Worthy Keeler PA-C Entered By: Worthy Keeler on 09/28/2018 11:12:46 [name], [name] ([id number]) -------------------------------------------------------------------------------- SuperBill Details Patient Name: [name]. Date of Service: 09/28/2018 Medical Record Number: [id number] Patient Account Number: [id number] Date of Birth/Sex: 26-Oct-1965 (53 y.o. Male) Treating RN: Cornell Barman Primary Care Provider: [name] Other Clinician: Referring Provider: [name] Treating Provider/Extender: Melburn Hake, Jerri Hargadon Weeks in Treatment: 3 Diagnosis Coding ICD-10 Codes Code Description E11.622 Type 2 diabetes mellitus with other skin  ulcer L97.812 Non-pressure chronic ulcer of other part of right lower leg with fat layer exposed I10 Essential (primary) hypertension E66.01 Morbid (severe) obesity due to excess calories Facility Procedures CPT4 Code Description: [id number] 11042 - DEB SUBQ TISSUE 20 SQ CM/< ICD-10 Diagnosis Description B44.967 Non-pressure chronic ulcer of other part of right lower leg wit Modifier: h fat layer expo Quantity: 1 sed Physician Procedures CPT4 Code Description: [id number] 11042 - WC PHYS SUBQ TISS 20 SQ CM ICD-10 Diagnosis Description Y65.993 Non-pressure chronic ulcer of other part of right lower leg wit Modifier: h fat layer expo Quantity: 1 sed Electronic Signature(s) Signed: 09/30/2018 2:36:46 AM By: Worthy Keeler PA-C Entered By: Worthy Keeler on 09/28/2018 11:13:49

## 2018-10-06 ENCOUNTER — Ambulatory Visit: Payer: Self-pay | Admitting: Physician Assistant

## 2018-10-10 ENCOUNTER — Encounter: Payer: Self-pay | Attending: Physician Assistant | Admitting: Physician Assistant

## 2018-10-10 ENCOUNTER — Telehealth: Payer: Self-pay | Admitting: Orthopaedic Surgery

## 2018-10-10 DIAGNOSIS — M109 Gout, unspecified: Secondary | ICD-10-CM | POA: Insufficient documentation

## 2018-10-10 DIAGNOSIS — Z87891 Personal history of nicotine dependence: Secondary | ICD-10-CM | POA: Insufficient documentation

## 2018-10-10 DIAGNOSIS — M25561 Pain in right knee: Secondary | ICD-10-CM | POA: Insufficient documentation

## 2018-10-10 DIAGNOSIS — G8929 Other chronic pain: Secondary | ICD-10-CM | POA: Insufficient documentation

## 2018-10-10 DIAGNOSIS — I10 Essential (primary) hypertension: Secondary | ICD-10-CM | POA: Insufficient documentation

## 2018-10-10 DIAGNOSIS — L97812 Non-pressure chronic ulcer of other part of right lower leg with fat layer exposed: Secondary | ICD-10-CM | POA: Insufficient documentation

## 2018-10-10 DIAGNOSIS — Z6841 Body Mass Index (BMI) 40.0 and over, adult: Secondary | ICD-10-CM | POA: Insufficient documentation

## 2018-10-10 DIAGNOSIS — E11622 Type 2 diabetes mellitus with other skin ulcer: Secondary | ICD-10-CM | POA: Insufficient documentation

## 2018-10-10 DIAGNOSIS — Z79891 Long term (current) use of opiate analgesic: Secondary | ICD-10-CM | POA: Insufficient documentation

## 2018-10-10 DIAGNOSIS — M1990 Unspecified osteoarthritis, unspecified site: Secondary | ICD-10-CM | POA: Insufficient documentation

## 2018-10-10 DIAGNOSIS — M25562 Pain in left knee: Secondary | ICD-10-CM | POA: Insufficient documentation

## 2018-10-10 MED ORDER — OXYCODONE-ACETAMINOPHEN 10-325 MG PO TABS: 1.0000 | ORAL_TABLET | ORAL | 0 refills | Status: DC | PRN

## 2018-10-10 NOTE — Telephone Encounter (Signed)
Oxycodone-Acetaminophen  10/325 mg  Qty 170 Tablets ° °PATIENT USES [facility] °

## 2018-10-11 NOTE — Progress Notes (Signed)
DARRON, STUCK ([id number]) Visit Report for 10/10/2018 Arrival Information Details Patient Name: [name], [name]. Date of Service: 10/10/2018 3:15 PM Medical Record Number: [id number] Patient Account Number: [id number] Date of Birth/Sex: 1966/07/21 (53 y.o. M) Treating [name]: Montey Hora Primary Care Avianah Pellman: [name] Other Clinician: Referring Antonela Freiman: [name] Treating Nala Kachel/Extender: Melburn Hake, HOYT Weeks in Treatment: 5 Visit Information History Since Last Visit Added or deleted any medications: No Patient Arrived: Ambulatory Any new allergies or adverse reactions: No Arrival Time: 15:22 Had a fall or experienced change in No Accompanied By: self activities of daily living that may affect Transfer Assistance: None risk of falls: Patient Identification Verified: Yes Signs or symptoms of abuse/neglect since last visito No Secondary Verification Process Completed: Yes Hospitalized since last visit: No Implantable device outside of the clinic excluding No cellular tissue based products placed in the center since last visit: Has Dressing in Place as Prescribed: Yes Pain Present Now: No Electronic Signature(s) Signed: 10/10/2018 4:14:38 PM By: Lorine Bears RCP, RRT, CHT Entered By: Lorine Bears on 10/10/2018 15:23:33 [name], [name] ([id number]) -------------------------------------------------------------------------------- Clinic Level of Care Assessment Details Patient Name: [name]. Date of Service: 10/10/2018 3:15 PM Medical Record Number: [id number] Patient Account Number: [id number] Date of Birth/Sex: 11-01-1965 (53 y.o. M) Treating [name]: Montey Hora Primary Care Alethea Terhaar: [name] Other Clinician: Referring Yanni Ruberg: [name] Treating Camille Thau/Extender: Melburn Hake, HOYT Weeks in Treatment: 5 Clinic Level of Care Assessment Items TOOL 4 Quantity Score []  - Use when only an EandM is performed on FOLLOW-UP visit  0 ASSESSMENTS - Nursing Assessment / Reassessment X - Reassessment of Co-morbidities (includes updates in patient status) 1 10 X- 1 5 Reassessment of Adherence to Treatment Plan ASSESSMENTS - Wound and Skin Assessment / Reassessment X - Simple Wound Assessment / Reassessment - one wound 1 5 []  - 0 Complex Wound Assessment / Reassessment - multiple wounds []  - 0 Dermatologic / Skin Assessment (not related to wound area) ASSESSMENTS - Focused Assessment []  - Circumferential Edema Measurements - multi extremities 0 []  - 0 Nutritional Assessment / Counseling / Intervention X- 1 5 Lower Extremity Assessment (monofilament, tuning fork, pulses) []  - 0 Peripheral Arterial Disease Assessment (using hand held doppler) ASSESSMENTS - Ostomy and/or Continence Assessment and Care []  - Incontinence Assessment and Management 0 []  - 0 Ostomy Care Assessment and Management (repouching, etc.) PROCESS - Coordination of Care X - Simple Patient / Family Education for ongoing care 1 15 []  - 0 Complex (extensive) Patient / Family Education for ongoing care X- 1 10 Staff obtains Programmer, systems, Records, Test Results / Process Orders []  - 0 Staff telephones HHA, Nursing Homes / Clarify orders / etc []  - 0 Routine Transfer to another Facility (non-emergent condition) []  - 0 Routine Hospital Admission (non-emergent condition) []  - 0 New Admissions / Biomedical engineer / Ordering NPWT, Apligraf, etc. []  - 0 Emergency Hospital Admission (emergent condition) X- 1 10 Simple Discharge Coordination RJ, PEDROSA. ([id number]) []  - 0 Complex (extensive) Discharge Coordination PROCESS - Special Needs []  - Pediatric / Minor Patient Management 0 []  - 0 Isolation Patient Management []  - 0 Hearing / Language / Visual special needs []  - 0 Assessment of Community assistance (transportation, D/C planning, etc.) []  - 0 Additional assistance / Altered mentation []  - 0 Support Surface(s) Assessment (bed,  cushion, seat, etc.) INTERVENTIONS - Wound Cleansing / Measurement X - Simple Wound Cleansing - one wound 1 5 []  - 0 Complex Wound Cleansing - multiple wounds  X- 1 5 Wound Imaging (photographs - any number of wounds) []  - 0 Wound Tracing (instead of photographs) X- 1 5 Simple Wound Measurement - one wound []  - 0 Complex Wound Measurement - multiple wounds INTERVENTIONS - Wound Dressings X - Small Wound Dressing one or multiple wounds 1 10 []  - 0 Medium Wound Dressing one or multiple wounds []  - 0 Large Wound Dressing one or multiple wounds []  - 0 Application of Medications - topical []  - 0 Application of Medications - injection INTERVENTIONS - Miscellaneous []  - External ear exam 0 []  - 0 Specimen Collection (cultures, biopsies, blood, body fluids, etc.) []  - 0 Specimen(s) / Culture(s) sent or taken to Lab for analysis []  - 0 Patient Transfer (multiple staff / Civil Service fast streamer / Similar devices) []  - 0 Simple Staple / Suture removal (25 or less) []  - 0 Complex Staple / Suture removal (26 or more) []  - 0 Hypo / Hyperglycemic Management (close monitor of Blood Glucose) []  - 0 Ankle / Brachial Index (ABI) - do not check if billed separately X- 1 5 Vital Signs Caulfield, Ralf Z. ([id number]) Has the patient been seen at the hospital within the last three years: Yes Total Score: 90 Level Of Care: New/Established - Level 3 Electronic Signature(s) Signed: 10/10/2018 4:52:45 PM By: Montey Hora Entered By: Montey Hora on 10/10/2018 16:21:01 [name], [name] ([id number]) -------------------------------------------------------------------------------- Encounter Discharge Information Details Patient Name: [name]. Date of Service: 10/10/2018 3:15 PM Medical Record Number: [id number] Patient Account Number: [id number] Date of Birth/Sex: 20-Jun-1966 (53 y.o. M) Treating [name]: Montey Hora Primary Care Adyan Palau: [name] Other Clinician: Referring Shawneequa Baldridge: [name] Treating Michae Grimley/Extender: Melburn Hake, HOYT Weeks in Treatment: 5 Encounter Discharge Information Items Discharge Condition: Stable Ambulatory Status: Ambulatory Discharge Destination: Home Transportation: Private Auto Accompanied By: self Schedule Follow-up Appointment: Yes Clinical Summary of Care: Electronic Signature(s) Signed: 10/10/2018 4:27:25 PM By: Montey Hora Entered By: Montey Hora on 10/10/2018 16:27:25 [name], [name] ([id number]) -------------------------------------------------------------------------------- Lower Extremity Assessment Details Patient Name: [name]. Date of Service: 10/10/2018 3:15 PM Medical Record Number: [id number] Patient Account Number: [id number] Date of Birth/Sex: 1966-04-16 (53 y.o. M) Treating [name]: Cornell Barman Primary Care Lorriann Hansmann: [name] Other Clinician: Referring Charise Leinbach: [name] Treating Tine Mabee/Extender: Melburn Hake, HOYT Weeks in Treatment: 5 Edema Assessment Assessed: [Left: No] [Right: No] [Left: Edema] [Right: :] Calf Left: Right: Point of Measurement: 34 cm From Medial Instep cm 48 cm Ankle Left: Right: Point of Measurement: 15 cm From Medial Instep cm 30 cm Vascular Assessment Pulses: Dorsalis Pedis Palpable: [Right:Yes] Posterior Tibial Extremity colors, hair growth, and conditions: Extremity Color: [Right:Normal] Hair Growth on Extremity: [Right:No] Temperature of Extremity: [Right:Warm] Notes Patient did not want to remove sock. Electronic Signature(s) Signed: 10/10/2018 5:09:39 PM By: [name], BSN, [name], [name], Kim [name], [name] Entered By: [name], BSN, [name], [name], [name] on 10/10/2018 15:43:59 [name], [name] ([id number]) -------------------------------------------------------------------------------- Multi Wound Chart Details Patient Name: [name]. Date of Service: 10/10/2018 3:15 PM Medical Record Number: [id number] Patient Account Number: [id number] Date of Birth/Sex: [birth year] (53 y.o. M) Treating  [name]: Cornell Barman Primary Care Aralyn Nowak: [name] Other Clinician: Referring Margot Oriordan: [name] Treating Roma Bondar/Extender: Melburn Hake, HOYT Weeks in Treatment: 5 Vital Signs Height(in): 68 Pulse(bpm): 25 Weight(lbs): 389 Blood Pressure(mmHg): 160/45 Body Mass Index(BMI): 59 Temperature(F): 97.5 Respiratory Rate 18 (breaths/min): Photos: [1:No Photos] [N/A:N/A] Wound Location: [1:Right Lower Leg - Lateral] [N/A:N/A] Wounding Event: [1:Gradually Appeared] [N/A:N/A] Primary Etiology: [1:Diabetic Wound/Ulcer of the Lower Extremity] [N/A:N/A] Comorbid  History: [1:Hypertension, Type II Diabetes, Gout] [N/A:N/A] Date Acquired: [1:06/06/2018] [N/A:N/A] Weeks of Treatment: [1:5] [N/A:N/A] Wound Status: [1:Open] [N/A:N/A] Measurements L x W x D [1:1.1x0.4x0.2] [N/A:N/A] (cm) Area (cm) : [1:0.346] [N/A:N/A] Volume (cm) : [1:0.069] [N/A:N/A] % Reduction in Area: [1:91.20%] [N/A:N/A] % Reduction in Volume: [1:82.40%] [N/A:N/A] Starting Position 1 [1:12] (o'clock): Ending Position 1 [1:12] (o'clock): Maximum Distance 1 (cm): [1:0.1] Undermining: [1:Yes] [N/A:N/A] Classification: [1:Grade 2] [N/A:N/A] Exudate Amount: [1:Medium] [N/A:N/A] Exudate Type: [1:Serous] [N/A:N/A] Exudate Color: [1:amber] [N/A:N/A] Wound Margin: [1:Distinct, outline attached] [N/A:N/A] Granulation Amount: [1:Medium (34-66%)] [N/A:N/A] Necrotic Amount: [1:Medium (34-66%)] [N/A:N/A] Exposed Structures: [1:Fat Layer (Subcutaneous Tissue) Exposed: Yes Fascia: No Tendon: No Muscle: No Joint: No Bone: No] [N/A:N/A] Epithelialization: [1:None] [N/A:N/A] Periwound Skin Texture: Excoriation: No N/A N/A Induration: No Callus: No Crepitus: No Rash: No Scarring: No Periwound Skin Moisture: Dry/Scaly: Yes N/A N/A Maceration: No Periwound Skin Color: Hemosiderin Staining: Yes N/A N/A Atrophie Blanche: No Cyanosis: No Ecchymosis: No Erythema: No Mottled: No Pallor: No Rubor: No Tenderness  on Palpation: Yes N/A N/A Wound Preparation: Ulcer Cleansing: N/A N/A Rinsed/Irrigated with Saline Topical Anesthetic Applied: Other: lidocaine 4% Treatment Notes Electronic Signature(s) Signed: 10/10/2018 5:09:39 PM By: [name], BSN, [name], [name], Kim [name], [name] Entered By: [name], BSN, [name], [name], [name] on 10/10/2018 15:44:55 [name], [name] ([id number]) -------------------------------------------------------------------------------- [place] Details Patient Name: [name]. Date of Service: 10/10/2018 3:15 PM Medical Record Number: [id number] Patient Account Number: [id number] Date of Birth/Sex: March 03, 1966 (53 y.o. M) Treating [name]: Cornell Barman Primary Care Bali Lyn: [name] Other Clinician: Referring Stuart Mirabile: [name] Treating Maisyn Nouri/Extender: Melburn Hake, HOYT Weeks in Treatment: 5 Active Inactive Venous Leg Ulcer Nursing Diagnoses: Knowledge deficit related to disease process and management Goals: Patient will maintain optimal edema control Date Initiated: 09/04/2018 Target Resolution Date: 10/05/2018 Goal Status: Active Patient/caregiver will verbalize understanding of disease process and disease management Date Initiated: 09/04/2018 Target Resolution Date: 10/05/2018 Goal Status: Active Interventions: Provide education on venous insufficiency Notes: Wound/Skin Impairment Nursing Diagnoses: Impaired tissue integrity Goals: Ulcer/skin breakdown will have a volume reduction of 30% by week 4 Date Initiated: 09/04/2018 Target Resolution Date: 10/05/2018 Goal Status: Active Interventions: Assess patient/caregiver ability to obtain necessary supplies Assess patient/caregiver ability to perform ulcer/skin care regimen upon admission and as needed Notes: Electronic Signature(s) Signed: 10/10/2018 5:09:39 PM By: [name], BSN, [name], [name], Kim [name], [name] Entered By: [name], BSN, [name], [name], [name] on 10/10/2018 15:44:49 [name], [name]  ([id number]) -------------------------------------------------------------------------------- Pain Assessment Details Patient Name: [name]. Date of Service: 10/10/2018 3:15 PM Medical Record Number: [id number] Patient Account Number: [id number] Date of Birth/Sex: 28-Jun-1966 (53 y.o. M) Treating [name]: Montey Hora Primary Care Gisel Vipond: [name] Other Clinician: Referring Sheika Coutts: [name] Treating Branton Einstein/Extender: Melburn Hake, HOYT Weeks in Treatment: 5 Active Problems Location of Pain Severity and Description of Pain Patient Has Paino No Site Locations Pain Management and Medication Current Pain Management: Electronic Signature(s) Signed: 10/10/2018 4:14:38 PM By: Paulla Fore, RRT, CHT Signed: 10/10/2018 4:52:45 PM By: Montey Hora Entered By: Lorine Bears on 10/10/2018 15:23:43 [name], [name] ([id number]) -------------------------------------------------------------------------------- Patient/Caregiver Education Details Patient Name: [name]. Date of Service: 10/10/2018 3:15 PM Medical Record Number: [id number] Patient Account Number: [id number] Date of Birth/Gender: 1966/02/15 (53 y.o. M) Treating [name]: Montey Hora Primary Care Physician: [name] Other Clinician: Referring Physician: [name] Treating Physician/Extender: Sharalyn Ink in Treatment: 5 Education Assessment Education Provided To: Patient Education Topics Provided Wound/Skin Impairment: Handouts: Other: wound care as ordered Methods:  Demonstration, Explain/Verbal Responses: State content correctly Electronic Signature(s) Signed: 10/10/2018 4:52:45 PM By: Montey Hora Entered By: Montey Hora on 10/10/2018 16:27:49 [name], [name] ([id number]) -------------------------------------------------------------------------------- Wound Assessment Details Patient Name: [name]. Date of Service: 10/10/2018 3:15 PM Medical Record  Number: [id number] Patient Account Number: [id number] Date of Birth/Sex: [birth year] (53 y.o. M) Treating [name]: Cornell Barman Primary Care Donterius Filley: [name] Other Clinician: Referring Lavaughn Haberle: [name] Treating Eason Housman/Extender: Melburn Hake, HOYT Weeks in Treatment: 5 Wound Status Wound Number: 1 Primary Etiology: Diabetic Wound/Ulcer of the Lower Extremity Wound Location: Right Lower Leg - Lateral Wound Status: Open Wounding Event: Gradually Appeared Comorbid Hypertension, Type II Diabetes, Gout Date Acquired: 06/06/2018 History: Weeks Of Treatment: 5 Clustered Wound: No Photos Photo Uploaded By: [name], BSN, [name], [name], [name] on 10/11/2018 07:59:40 Wound Measurements Length: (cm) 1.1 % Reducti Width: (cm) 0.4 % Reducti Depth: (cm) 0.2 Epithelia Area: (cm) 0.346 Tunnelin Volume: (cm) 0.069 Undermin Starti Ending Maximu on in Area: 91.2% on in Volume: 82.4% lization: None g: No ing: Yes ng Position (o'clock): 12 Position (o'clock): 12 m Distance: (cm) 0.1 Wound Description Classification: Grade 2 Foul Odor Wound Margin: Distinct, outline attached Slough/Fi Exudate Amount: Medium Exudate Type: Serous Exudate Color: amber After Cleansing: No brino Yes Wound Bed Granulation Amount: Medium (34-66%) Exposed Structure Necrotic Amount: Medium (34-66%) Fascia Exposed: No Necrotic Quality: Adherent Slough Fat Layer (Subcutaneous Tissue) Exposed: Yes Tendon Exposed: No Muscle Exposed: No Raney, Anquan Z. ([id number]) Joint Exposed: No Bone Exposed: No Periwound Skin Texture Texture Color No Abnormalities Noted: No No Abnormalities Noted: No Callus: No Atrophie Blanche: No Crepitus: No Cyanosis: No Excoriation: No Ecchymosis: No Induration: No Erythema: No Rash: No Hemosiderin Staining: Yes Scarring: No Mottled: No Pallor: No Moisture Rubor: No No Abnormalities Noted: No Dry / Scaly: Yes Temperature / Pain Maceration: No Tenderness on Palpation:  Yes Wound Preparation Ulcer Cleansing: Rinsed/Irrigated with Saline Topical Anesthetic Applied: Other: lidocaine 4%, Treatment Notes Wound #1 (Right, Lateral Lower Leg) Notes prisma, BFD Electronic Signature(s) Signed: 10/10/2018 5:09:39 PM By: [name], BSN, [name], [name], Kim [name], [name] Entered By: [name], BSN, [name], [name], [name] on 10/10/2018 15:42:36 Reister, [name] ([id number]) -------------------------------------------------------------------------------- Vitals Details Patient Name: [name]. Date of Service: 10/10/2018 3:15 PM Medical Record Number: [id number] Patient Account Number: [id number] Date of Birth/Sex: 12-17-1965 (53 y.o. M) Treating [name]: Montey Hora Primary Care Colbert Curenton: [name] Other Clinician: Referring Alesana Magistro: [name] Treating Sircharles Holzheimer/Extender: STONE III, HOYT Weeks in Treatment: 5 Vital Signs Time Taken: 15:23 Temperature (F): 97.5 Height (in): 68 Pulse (bpm): 84 Weight (lbs): 389 Respiratory Rate (breaths/min): 18 Body Mass Index (BMI): 59.1 Blood Pressure (mmHg): 160/45 Reference Range: 80 - 120 mg / dl Airway Electronic Signature(s) Signed: 10/10/2018 4:14:38 PM By: Lorine Bears RCP, RRT, CHT Entered By: Lorine Bears on 10/10/2018 15:27:14

## 2018-10-12 ENCOUNTER — Ambulatory Visit: Payer: Self-pay | Admitting: Nutrition

## 2018-10-12 NOTE — Progress Notes (Signed)
AUDY, DAUPHINE ([id number]) Visit Report for 10/10/2018 Chief Complaint Document Details Patient Name: [name], [name]. Date of Service: 10/10/2018 3:15 PM Medical Record Number: [id number] Patient Account Number: [id number] Date of Birth/Sex: 09/01/1966 (53 y.o. M) Treating RN: Montey Hora Primary Care Provider: [name] Other Clinician: Referring Provider: [name] Treating Provider/Extender: Melburn Hake, HOYT Weeks in Treatment: 5 Information Obtained from: Patient Chief Complaint Right lower leg ulcer Electronic Signature(s) Signed: 10/10/2018 5:55:30 PM By: Worthy Keeler PA-C Entered By: Worthy Keeler on 10/10/2018 16:03:24 Sachdeva, Jonette Eva ([id number]) -------------------------------------------------------------------------------- HPI Details Patient Name: [name]. Date of Service: 10/10/2018 3:15 PM Medical Record Number: [id number] Patient Account Number: [id number] Date of Birth/Sex: Jan 23, 1966 (53 y.o. M) Treating RN: Montey Hora Primary Care Provider: [name] Other Clinician: Referring Provider: [name] Treating Provider/Extender: Melburn Hake, HOYT Weeks in Treatment: 5 History of Present Illness HPI Description: 53 year old male with past medical history of diabetes type 2, HTN, obesity, former smoker, and chronic narcotic use. Present today for right lower leg ulcer. He states that he first noticed wound back in October 2019. At that time he felt that he was bitten by something. There was no OTC treatment. Over time the wound became larger with increased pain. He was seen by [facility] and started on Keflex antibiotics for cellulitis for 14 days. Finished abx 2-3 days ago. Chronic bilateral knee pain. HA1C in November 2019 was 9.6. He is independent of ADLs however, he is unable to reach the right leg wound area. Left ABI in clinic is 0.94, right is non-compressible. 09/11/17 on evaluation today patient has a wound over the right  lower extremity which appears to be extremely painful for him. He states at this time that the pain is a 10 out of 10 currently. Nonetheless there does not appear to be any signs of infection. He still does not have any insurance at this point and therefore a vascular evaluation is not possible he tells me. Obviously I do think that with his right lower extremity be noncompressible we really need to have vascular evaluation order to consider any type of wrap or compressive therapy. 09/19/18 on evaluation today patient appears to be doing a little better in regard to his right lateral lower Trinity ulcer. He has been tolerating the dressing changes without complication. Fortunately there is no sign of infection at this time. He has been tolerating the dressing changes without complication. He does have significant fluid buildup of the right lower extremity but at this point no wraps are being performed as he was noncompressible during initial evaluation. He also has not wanted [place] to proceed with a vascular referral for arterial studies due to cost. He does not currently have insurance. 09/28/18 on evaluation today patient appears to be doing well in regard to his right lateral lower extremity ulcer. He's been tolerating the dressing changes without complication. Fortunately there is good signs of improvement which is excellent. 10/10/18 on evaluation today patient appears to be doing rather well in regard to his right lateral lower extremity ulcer. He has noted signs of improvement which is good news in fact some of the lower wounds of actually completely closed off which is excellent news. Fortunately there is no evidence of infection at this time. Electronic Signature(s) Signed: 10/10/2018 5:55:30 PM By: Worthy Keeler PA-C Entered By: Worthy Keeler on 10/10/2018 17:34:27 Bomar, Jonette Eva ([id number]) -------------------------------------------------------------------------------- Physical Exam  Details Patient Name: [name]. Date  of Service: 10/10/2018 3:15 PM Medical Record Number: [id number] Patient Account Number: [id number] Date of Birth/Sex: 1966-07-30 (53 y.o. M) Treating RN: Montey Hora Primary Care Provider: [name] Other Clinician: Referring Provider: [name] Treating Provider/Extender: STONE III, HOYT Weeks in Treatment: 5 Constitutional Well-nourished and well-hydrated in no acute distress. Respiratory normal breathing without difficulty. clear to auscultation bilaterally. Cardiovascular regular rate and rhythm with normal S1, S2. Psychiatric this patient is able to make decisions and demonstrates good insight into disease process. Alert and Oriented x 3. pleasant and cooperative. Notes Patient's wound bed currently shows signs of good granulation at this time fortunately there is no significant slough buildup at this point required sharp debridement. Overall he does have [employer] the edema he really doesn't want to compression wrap he does state that he has compression stockings but he doesn't always wear them. I think you really should wear them currently. Electronic Signature(s) Signed: 10/10/2018 5:55:30 PM By: Worthy Keeler PA-C Entered By: Worthy Keeler on 10/10/2018 17:35:01 Caratachea, Jonette Eva ([id number]) -------------------------------------------------------------------------------- Physician Orders Details Patient Name: [name]. Date of Service: 10/10/2018 3:15 PM Medical Record Number: [id number] Patient Account Number: [id number] Date of Birth/Sex: [birth year] (53 y.o. M) Treating RN: Montey Hora Primary Care Provider: [name] Other Clinician: Referring Provider: [name] Treating Provider/Extender: Melburn Hake, HOYT Weeks in Treatment: 5 Verbal / Phone Orders: No Diagnosis Coding ICD-10 Coding Code Description E11.622 Type 2 diabetes mellitus with other skin ulcer L97.812 Non-pressure chronic ulcer of  other part of right lower leg with fat layer exposed I10 Essential (primary) hypertension E66.01 Morbid (severe) obesity due to excess calories Wound Cleansing Wound #1 Right,Lateral Lower Leg o May Shower, gently pat wound dry prior to applying new dressing. Anesthetic (add to Medication List) Wound #1 Right,Lateral Lower Leg o Topical Lidocaine 4% cream applied to wound bed prior to debridement (In Clinic Only). Primary Wound Dressing Wound #1 Right,Lateral Lower Leg o Silver Collagen Secondary Dressing o Boardered Foam Dressing Dressing Change Frequency Wound #1 Right,Lateral Lower Leg o Change dressing every other day. Follow-up Appointments Wound #1 Right,Lateral Lower Leg o Return Appointment in 1 week. Edema Control Wound #1 Right,Lateral Lower Leg o Patient to wear own compression stockings Electronic Signature(s) Signed: 10/10/2018 4:52:45 PM By: Montey Hora Signed: 10/10/2018 5:55:30 PM By: Worthy Keeler PA-C Entered By: Montey Hora on 10/10/2018 16:08:22 Mahrt, Jonette Eva ([id number]) -------------------------------------------------------------------------------- Problem List Details Patient Name: [name]. Date of Service: 10/10/2018 3:15 PM Medical Record Number: [id number] Patient Account Number: [id number] Date of Birth/Sex: 06/24/1966 (53 y.o. M) Treating RN: Montey Hora Primary Care Provider: [name] Other Clinician: Referring Provider: [name] Treating Provider/Extender: Melburn Hake, HOYT Weeks in Treatment: 5 Active Problems ICD-10 Evaluated Encounter Code Description Active Date Today Diagnosis E11.622 Type 2 diabetes mellitus with other skin ulcer 09/04/2018 No Yes L97.812 Non-pressure chronic ulcer of other part of right lower leg 09/04/2018 No Yes with fat layer exposed [place] (primary) hypertension 09/04/2018 No Yes E66.01 Morbid (severe) obesity due to excess calories 09/04/2018 No Yes Inactive  Problems Resolved Problems Electronic Signature(s) Signed: 10/10/2018 5:55:30 PM By: Worthy Keeler PA-C Entered By: Worthy Keeler on 10/10/2018 16:03:18 Suniga, Jonette Eva ([id number]) -------------------------------------------------------------------------------- Progress Note Details Patient Name: [name]. Date of Service: 10/10/2018 3:15 PM Medical Record Number: [id number] Patient Account Number: [id number] Date of Birth/Sex: 01-08-1966 (53 y.o. M) Treating RN: Montey Hora Primary Care Provider: [name] Other Clinician: Referring Provider: [name] Treating Provider/Extender: Joaquim Lai  III, HOYT Weeks in Treatment: 5 Subjective Chief Complaint Information obtained from Patient Right lower leg ulcer History of Present Illness (HPI) 53 year old male with past medical history of diabetes type 2, HTN, obesity, former smoker, and chronic narcotic use. Present today for right lower leg ulcer. He states that he first noticed wound back in October 2019. At that time he felt that he was bitten by something. There was no OTC treatment. Over time the wound became larger with increased pain. He was seen by [facility] urgent care and started on Keflex antibiotics for cellulitis for 14 days. Finished abx 2-3 days ago. Chronic bilateral knee pain. HA1C in November 2019 was 9.6. He is independent of ADLs however, he is unable to reach the right leg wound area. Left ABI in clinic is 0.94, right is non-compressible. 09/11/17 on evaluation today patient has a wound over the right lower extremity which appears to be extremely painful for him. He states at this time that the pain is a 10 out of 10 currently. Nonetheless there does not appear to be any signs of infection. He still does not have any insurance at this point and therefore a vascular evaluation is not possible he tells me. Obviously I do think that with his right lower extremity be noncompressible we really need to have vascular  evaluation order to consider any type of wrap or compressive therapy. 09/19/18 on evaluation today patient appears to be doing a little better in regard to his right lateral lower Trinity ulcer. He has been tolerating the dressing changes without complication. Fortunately there is no sign of infection at this time. He has been tolerating the dressing changes without complication. He does have significant fluid buildup of the right lower extremity but at this point no wraps are being performed as he was noncompressible during initial evaluation. He also has not wanted [place] to proceed with a vascular referral for arterial studies due to cost. He does not currently have insurance. 09/28/18 on evaluation today patient appears to be doing well in regard to his right lateral lower extremity ulcer. He's been tolerating the dressing changes without complication. Fortunately there is good signs of improvement which is excellent. 10/10/18 on evaluation today patient appears to be doing rather well in regard to his right lateral lower extremity ulcer. He has noted signs of improvement which is good news in fact some of the lower wounds of actually completely closed off which is excellent news. Fortunately there is no evidence of infection at this time. Patient History Information obtained from Patient. Social History Former smoker - started on 09/15/2002 - ended on 09/06/2012, Marital Status - Single, Alcohol Use - Never, Drug Use - No History, Caffeine Use - Daily - sodas. Medical History Cardiovascular Patient has history of Hypertension Endocrine Patient has history of Type II Diabetes - DX age 57A1C 9.6 Genitourinary Denies history of End Stage Renal Disease Shabazz, JENSON BEEDLE. ([id number]) Musculoskeletal Patient has history of Gout Oncologic Denies history of Received Chemotherapy, Received Radiation Medical And Surgical History Notes Musculoskeletal Arthritis Review of Systems (ROS) Constitutional  Symptoms (General Health) Denies complaints or symptoms of Fever, Chills. Respiratory The patient has no complaints or symptoms. Cardiovascular Complains or has symptoms of LE edema. Psychiatric The patient has no complaints or symptoms. Objective Constitutional Well-nourished and well-hydrated in no acute distress. Vitals Time Taken: 3:23 PM, Height: 68 in, Weight: 389 lbs, BMI: 59.1, Temperature: 97.5 F, Pulse: 84 bpm, Respiratory Rate: 18 breaths/min, Blood Pressure: 160/45  mmHg. Respiratory normal breathing without difficulty. clear to auscultation bilaterally. Cardiovascular regular rate and rhythm with normal S1, S2. Psychiatric this patient is able to make decisions and demonstrates good insight into disease process. Alert and Oriented x 3. pleasant and cooperative. General Notes: Patient's wound bed currently shows signs of good granulation at this time fortunately there is no significant slough buildup at this point required sharp debridement. Overall he does have [employer] the edema he really doesn't want to compression wrap he does state that he has compression stockings but he doesn't always wear them. I think you really should wear them currently. Integumentary (Hair, Skin) Wound #1 status is Open. Original cause of wound was Gradually Appeared. The wound is located on the Right,Lateral Lower Leg. The wound measures 1.1cm length x 0.4cm width x 0.2cm depth; 0.346cm^2 area and 0.069cm^3 volume. There is Fat Layer (Subcutaneous Tissue) Exposed exposed. There is no tunneling noted, however, there is undermining starting at 12:00 and ending at 12:00 with a maximum distance of 0.1cm. There is a medium amount of serous drainage noted. The wound margin is distinct with the outline attached to the wound base. There is medium (34-66%) granulation within the wound bed. There is a medium (34-66%) amount of necrotic tissue within the wound bed including Adherent Slough. The  periwound Jerez, Denim Z. ([id number]) skin appearance exhibited: Dry/Scaly, Hemosiderin Staining. The periwound skin appearance did not exhibit: Callus, Crepitus, Excoriation, Induration, Rash, Scarring, Maceration, Atrophie Blanche, Cyanosis, Ecchymosis, Mottled, Pallor, Rubor, Erythema. The periwound has tenderness on palpation. Assessment Active Problems ICD-10 Type 2 diabetes mellitus with other skin ulcer Non-pressure chronic ulcer of other part of right lower leg with fat layer exposed Essential (primary) hypertension Morbid (severe) obesity due to excess calories Plan Wound Cleansing: Wound #1 Right,Lateral Lower Leg: May Shower, gently pat wound dry prior to applying new dressing. Anesthetic (add to Medication List): Wound #1 Right,Lateral Lower Leg: Topical Lidocaine 4% cream applied to wound bed prior to debridement (In Clinic Only). Primary Wound Dressing: Wound #1 Right,Lateral Lower Leg: Silver Collagen Secondary Dressing: Boardered Foam Dressing Dressing Change Frequency: Wound #1 Right,Lateral Lower Leg: Change dressing every other day. Follow-up Appointments: Wound #1 Right,Lateral Lower Leg: Return Appointment in 1 week. Edema Control: Wound #1 Right,Lateral Lower Leg: Patient to wear own compression stockings My suggestion at this point is gonna be that we go ahead and continue with the above wound care measures for the next week. Patient is in agreement with that plan. We will subsequently see were things stand at follow-up. If anything changes or worsens in the meantime he will contact the office and let me know. Please see above for specific wound care orders. We will see patient for re-evaluation in 1 week(s) here in the clinic. If anything worsens or changes patient will contact our office for additional recommendations. Electronic Signature(s) ARIV, PENROD ([id number]) Signed: 10/10/2018 5:55:30 PM By: Worthy Keeler PA-C Entered By: Worthy Keeler on  10/10/2018 17:35:21 Hollenkamp, Jonette Eva ([id number]) -------------------------------------------------------------------------------- ROS/PFSH Details Patient Name: [name]. Date of Service: 10/10/2018 3:15 PM Medical Record Number: [id number] Patient Account Number: [id number] Date of Birth/Sex: 1965/10/31 (53 y.o. M) Treating RN: Montey Hora Primary Care Provider: [name] Other Clinician: Referring Provider: [name] Treating Provider/Extender: Melburn Hake, HOYT Weeks in Treatment: 5 Information Obtained From Patient Wound History Do you currently have one or more open woundso Yes How many open wounds do you currently haveo 1 Approximately how long have you had your woundso  06/06/2018 How have you been treating your wound(s) until nowo antibiotic(Keflex), soap and water Has your wound(s) ever healed and then re-openedo No Have you had any lab work done in the past montho No Have you tested positive for an antibiotic resistant organism (MRSA, VRE)o No Have you tested positive for osteomyelitis (bone infection)o No Have you had any tests for circulation on your legso No Have you had other problems associated with your woundso Swelling Constitutional Symptoms (General Health) Complaints and Symptoms: Negative for: Fever; Chills Cardiovascular Complaints and Symptoms: Positive for: LE edema Medical History: Positive for: Hypertension Respiratory Complaints and Symptoms: No Complaints or Symptoms Endocrine Medical History: Positive for: Type II Diabetes - DX age 89A1C 9.6 Time with diabetes: 3 years Treated with: Insulin, Oral agents Blood sugar tested every day: Yes Tested : Genitourinary Medical History: Negative for: End Stage Renal Disease Musculoskeletal MYKALE, GANDOLFO ([id number]) Medical History: Positive for: Gout Past Medical History Notes: Arthritis Oncologic Medical History: Negative for: Received Chemotherapy; Received  Radiation Psychiatric Complaints and Symptoms: No Complaints or Symptoms Immunizations Pneumococcal Vaccine: Received Pneumococcal Vaccination: No Implantable Devices Family and Social History Former smoker - started on 09/15/2002 - ended on 09/06/2012; Marital Status - Single; Alcohol Use: Never; Drug Use: No History; Caffeine Use: Daily - sodas; Financial Concerns: No; Food, Clothing or Shelter Needs: No; Support System Lacking: No; Transportation Concerns: No; Advanced Directives: No; Patient does not want information on Advanced Directives; Living Will: No; Medical Power of Attorney: No Physician Affirmation I have reviewed and agree with the above information. Electronic Signature(s) Signed: 10/10/2018 5:55:30 PM By: Worthy Keeler PA-C Signed: 10/11/2018 5:48:53 PM By: Montey Hora Entered By: Worthy Keeler on 10/10/2018 17:34:44 Sobolewski, Jonette Eva ([id number]) -------------------------------------------------------------------------------- SuperBill Details Patient Name: [name]. Date of Service: 10/10/2018 Medical Record Number: [id number] Patient Account Number: [id number] Date of Birth/Sex: 1966/03/10 (53 y.o. M) Treating RN: Montey Hora Primary Care Provider: [name] Other Clinician: Referring Provider: [name] Treating Provider/Extender: Melburn Hake, HOYT Weeks in Treatment: 5 Diagnosis Coding ICD-10 Codes Code Description E11.622 Type 2 diabetes mellitus with other skin ulcer L97.812 Non-pressure chronic ulcer of other part of right lower leg with fat layer exposed I10 Essential (primary) hypertension E66.01 Morbid (severe) obesity due to excess calories Facility Procedures CPT4 Code: [id number] Description: 99213 - WOUND CARE VISIT-LEV 3 EST PT Modifier: Quantity: 1 Physician Procedures CPT4 Code Description: [id number] - WC PHYS LEVEL 4 - EST PT ICD-10 Diagnosis Description E11.622 Type 2 diabetes mellitus with other skin ulcer L97.812  Non-pressure chronic ulcer of other part of right lower leg wi I10 Essential (primary)  hypertension E66.01 Morbid (severe) obesity due to excess calories Modifier: th fat layer expo Quantity: 1 sed Electronic Signature(s) Signed: 10/10/2018 5:55:30 PM By: Worthy Keeler PA-C Entered By: Worthy Keeler on 10/10/2018 17:35:32

## 2018-10-16 ENCOUNTER — Ambulatory Visit: Payer: Self-pay | Admitting: Physician Assistant

## 2018-10-16 ENCOUNTER — Encounter: Payer: Self-pay | Admitting: Physician Assistant

## 2018-10-17 ENCOUNTER — Encounter: Payer: Self-pay | Admitting: Physician Assistant

## 2018-10-17 ENCOUNTER — Ambulatory Visit: Payer: Self-pay | Admitting: Physician Assistant

## 2018-10-17 VITALS — BP 148/83 | HR 82 | Temp 97.1°F | Ht 65.5 in | Wt >= 6400 oz

## 2018-10-17 DIAGNOSIS — E11622 Type 2 diabetes mellitus with other skin ulcer: Secondary | ICD-10-CM

## 2018-10-17 DIAGNOSIS — E785 Hyperlipidemia, unspecified: Secondary | ICD-10-CM

## 2018-10-17 DIAGNOSIS — E1165 Type 2 diabetes mellitus with hyperglycemia: Secondary | ICD-10-CM

## 2018-10-17 DIAGNOSIS — I10 Essential (primary) hypertension: Secondary | ICD-10-CM

## 2018-10-17 DIAGNOSIS — Z6841 Body Mass Index (BMI) 40.0 and over, adult: Secondary | ICD-10-CM

## 2018-10-17 DIAGNOSIS — L97909 Non-pressure chronic ulcer of unspecified part of unspecified lower leg with unspecified severity: Secondary | ICD-10-CM

## 2018-10-17 MED ORDER — SITAGLIPTIN PHOSPHATE 100 MG PO TABS: 100.0000 mg | ORAL_TABLET | Freq: Every day | ORAL | 0 refills | Status: DC

## 2018-10-17 MED ORDER — LISINOPRIL 20 MG PO TABS: 20.0000 mg | ORAL_TABLET | Freq: Every day | ORAL | 1 refills | Status: DC

## 2018-10-17 MED ORDER — METOPROLOL TARTRATE 50 MG PO TABS: 50.0000 mg | ORAL_TABLET | Freq: Two times a day (BID) | ORAL | 1 refills | Status: DC

## 2018-10-17 MED ORDER — METFORMIN HCL 1000 MG PO TABS: 1000.0000 mg | ORAL_TABLET | Freq: Two times a day (BID) | ORAL | 1 refills | Status: DC

## 2018-10-17 MED ORDER — FUROSEMIDE 20 MG PO TABS: 20.0000 mg | ORAL_TABLET | Freq: Every day | ORAL | 0 refills | Status: DC

## 2018-10-17 NOTE — Progress Notes (Signed)
BP (!) 148/83 (BP Location: Right Arm, Patient Position: Sitting, Cuff Size: Large)   Pulse 82   Temp (!) 97.1 F (36.2 C)   Ht 5' 5.5" (1.664 m)   Wt (!) 413 lb 8 oz (187.6 kg)   SpO2 99%   BMI 67.76 kg/m    Subjective:    Patient ID: [name], male    DOB: March 17, 1966, 53 y.o.   MRN: [id number]  HPI: [name] is a 53 y.o. male presenting on 10/17/2018 for Diabetes   HPI   Pt out of metoprolol for a few days  Pt has bs log.  Mostly in 130s and 140s.  No lows.  A couple just over 200.    Pt continues with wound clinic.   He says the provider there recommended he start diuretic to help with fluid in his lower extremities.   Relevant past medical, surgical, family and social history reviewed and updated as indicated. Interim medical history since our last visit reviewed. Allergies and medications reviewed and updated.   Current Outpatient Medications:  .  insulin glargine (LANTUS) 100 UNIT/ML injection, Inject 24 Units into the skin at bedtime. , Disp: , Rfl:  .  lisinopril (PRINIVIL,ZESTRIL) 20 MG tablet, Take 1 tablet (20 mg total) by mouth daily., Disp: 90 tablet, Rfl: 1 .  metFORMIN (GLUCOPHAGE) 1000 MG tablet, Take 1 tablet (1,000 mg total) by mouth 2 (two) times daily with a meal., Disp: 180 tablet, Rfl: 1 .  oxyCODONE-acetaminophen (PERCOCET) 10-325 MG tablet, Take 1 tablet by mouth every 4 (four) hours as needed for pain., Disp: 170 tablet, Rfl: 0 .  sitaGLIPtin (JANUVIA) 100 MG tablet, Take 100 mg by mouth daily., Disp: , Rfl:  .  metoprolol tartrate (LOPRESSOR) 50 MG tablet, Take 1 tablet (50 mg total) by mouth 2 (two) times daily. (Patient not taking: Reported on 10/17/2018), Disp: 60 tablet, Rfl: 1    Review of Systems  Constitutional: Negative for appetite change, chills, diaphoresis, fatigue, fever and unexpected weight change.  HENT: Negative for congestion, dental problem, drooling, ear pain, facial swelling, hearing loss, mouth sores, sneezing, sore  throat, trouble swallowing and voice change.   Eyes: Negative for pain, discharge, redness, itching and visual disturbance.  Respiratory: Negative for cough, choking, shortness of breath and wheezing.   Cardiovascular: Negative for chest pain, palpitations and leg swelling.  Gastrointestinal: Negative for abdominal pain, blood in stool, constipation, diarrhea and vomiting.  Endocrine: Negative for cold intolerance, heat intolerance and polydipsia.  Genitourinary: Negative for decreased urine volume, dysuria and hematuria.  Musculoskeletal: Negative for arthralgias, back pain and gait problem.  Skin: Negative for rash.  Allergic/Immunologic: Negative for environmental allergies.  Neurological: Negative for seizures, syncope, light-headedness and headaches.  Hematological: Negative for adenopathy.  Psychiatric/Behavioral: Negative for agitation, dysphoric mood and suicidal ideas. The patient is not nervous/anxious.     Per HPI unless specifically indicated above     Objective:    BP (!) 148/83 (BP Location: Right Arm, Patient Position: Sitting, Cuff Size: Large)   Pulse 82   Temp (!) 97.1 F (36.2 C)   Ht 5' 5.5" (1.664 m)   Wt (!) 413 lb 8 oz (187.6 kg)   SpO2 99%   BMI 67.76 kg/m   Wt Readings from Last 3 Encounters:  10/17/18 (!) 413 lb 8 oz (187.6 kg)  09/14/18 (!) 404 lb 8 oz (183.5 kg)  08/15/18 (!) 403 lb 8 oz (183 kg)    Physical Exam Vitals  signs reviewed.  Constitutional:      Appearance: He is well-developed.  HENT:     Head: Normocephalic and atraumatic.  Neck:     Musculoskeletal: Neck supple.  Cardiovascular:     Rate and Rhythm: Normal rate and regular rhythm.  Pulmonary:     Effort: Pulmonary effort is normal.     Breath sounds: Normal breath sounds. No wheezing.  Abdominal:     General: Bowel sounds are normal.     Palpations: Abdomen is soft.     Tenderness: There is no abdominal tenderness.  Lymphadenopathy:     Cervical: No cervical adenopathy.   Skin:    General: Skin is warm and dry.  Neurological:     Mental Status: He is alert and oriented to person, place, and time.  Psychiatric:        Behavior: Behavior normal.         Assessment & Plan:    Encounter Diagnoses  Name Primary?  [name] Uncontrolled type 2 diabetes mellitus with hyperglycemia ([place]) Yes  . Diabetic ulcer of lower leg ([place])   . Hyperlipidemia, unspecified hyperlipidemia type   . Essential hypertension   . Morbid obesity ([place])   . BMI 60.0-69.9, adult ([place])    -will start lasix.  Discussed with pt that this will likely have negligible effect.  Recommended weight loss to have better effect -pt counseled to Check labs 2 week after starting lasix.  -pt counseled to avoid running out of bp medication -F/u 1 month.  RTO sooner prn

## 2018-10-18 NOTE — Progress Notes (Signed)
CHICO, CAWOOD ([id number]) Visit Report for 10/16/2018 Arrival Information Details Patient Name: [name], [name]. Date of Service: 10/16/2018 3:45 PM Medical Record Number: [id number] Patient Account Number: [id number] Date of Birth/Sex: [birth year] (53 y.o. M) Treating RN: Montey Hora Primary Care Caprisha Bridgett: [name] Other Clinician: Referring Artem Bunte: [name] Treating Tiffancy Moger/Extender: Melburn Hake, HOYT Weeks in Treatment: 6 Visit Information History Since Last Visit Added or deleted any medications: No Patient Arrived: Ambulatory Any new allergies or adverse reactions: No Arrival Time: 16:12 Had a fall or experienced change in No Accompanied By: self activities of daily living that may affect Transfer Assistance: None risk of falls: Patient Identification Verified: Yes Signs or symptoms of abuse/neglect since last visito No Secondary Verification Process Completed: Yes Hospitalized since last visit: No Implantable device outside of the clinic excluding No cellular tissue based products placed in the center since last visit: Has Dressing in Place as Prescribed: Yes Pain Present Now: Yes Electronic Signature(s) Signed: 10/16/2018 5:02:11 PM By: Montey Hora Entered By: Montey Hora on 10/16/2018 16:15:16 Clippard, Jonette Eva ([id number]) -------------------------------------------------------------------------------- Clinic Level of Care Assessment Details Patient Name: [name]. Date of Service: 10/16/2018 3:45 PM Medical Record Number: [id number] Patient Account Number: [id number] Date of Birth/Sex: October 07, 1965 (53 y.o. M) Treating RN: Cornell Barman Primary Care Elverta Dimiceli: [name] Other Clinician: Referring Timur Nibert: [name] Treating Deondra Wigger/Extender: Melburn Hake, HOYT Weeks in Treatment: 6 Clinic Level of Care Assessment Items TOOL 4 Quantity Score []  - Use when only an EandM is performed on FOLLOW-UP visit 0 ASSESSMENTS - Nursing Assessment /  Reassessment []  - Reassessment of Co-morbidities (includes updates in patient status) 0 X- 1 5 Reassessment of Adherence to Treatment Plan ASSESSMENTS - Wound and Skin Assessment / Reassessment X - Simple Wound Assessment / Reassessment - one wound 1 5 []  - 0 Complex Wound Assessment / Reassessment - multiple wounds []  - 0 Dermatologic / Skin Assessment (not related to wound area) ASSESSMENTS - Focused Assessment []  - Circumferential Edema Measurements - multi extremities 0 []  - 0 Nutritional Assessment / Counseling / Intervention []  - 0 Lower Extremity Assessment (monofilament, tuning fork, pulses) []  - 0 Peripheral Arterial Disease Assessment (using hand held doppler) ASSESSMENTS - Ostomy and/or Continence Assessment and Care []  - Incontinence Assessment and Management 0 []  - 0 Ostomy Care Assessment and Management (repouching, etc.) PROCESS - Coordination of Care X - Simple Patient / Family Education for ongoing care 1 15 []  - 0 Complex (extensive) Patient / Family Education for ongoing care []  - 0 Staff obtains Programmer, systems, Records, Test Results / Process Orders []  - 0 Staff telephones HHA, Nursing Homes / Clarify orders / etc []  - 0 Routine Transfer to another Facility (non-emergent condition) []  - 0 Routine Hospital Admission (non-emergent condition) []  - 0 New Admissions / Biomedical engineer / Ordering NPWT, Apligraf, etc. []  - 0 Emergency Hospital Admission (emergent condition) X- 1 10 Simple Discharge Coordination LORIS, WINROW. ([id number]) []  - 0 Complex (extensive) Discharge Coordination PROCESS - Special Needs []  - Pediatric / Minor Patient Management 0 []  - 0 Isolation Patient Management []  - 0 Hearing / Language / Visual special needs []  - 0 Assessment of Community assistance (transportation, D/C planning, etc.) []  - 0 Additional assistance / Altered mentation []  - 0 Support Surface(s) Assessment (bed, cushion, seat, etc.) INTERVENTIONS - Wound  Cleansing / Measurement X - Simple Wound Cleansing - one wound 1 5 []  - 0 Complex Wound Cleansing - multiple wounds X- 1 5 Wound Imaging (photographs -  any number of wounds) []  - 0 Wound Tracing (instead of photographs) X- 1 5 Simple Wound Measurement - one wound []  - 0 Complex Wound Measurement - multiple wounds INTERVENTIONS - Wound Dressings []  - Small Wound Dressing one or multiple wounds 0 X- 1 15 Medium Wound Dressing one or multiple wounds []  - 0 Large Wound Dressing one or multiple wounds []  - 0 Application of Medications - topical []  - 0 Application of Medications - injection INTERVENTIONS - Miscellaneous []  - External ear exam 0 []  - 0 Specimen Collection (cultures, biopsies, blood, body fluids, etc.) []  - 0 Specimen(s) / Culture(s) sent or taken to Lab for analysis []  - 0 Patient Transfer (multiple staff / Civil Service fast streamer / Similar devices) []  - 0 Simple Staple / Suture removal (25 or less) []  - 0 Complex Staple / Suture removal (26 or more) []  - 0 Hypo / Hyperglycemic Management (close monitor of Blood Glucose) []  - 0 Ankle / Brachial Index (ABI) - do not check if billed separately X- 1 5 Vital Signs Twiford, Rony Z. ([id number]) Has the patient been seen at the hospital within the last three years: Yes Total Score: 70 Level Of Care: New/Established - Level 2 Electronic Signature(s) Signed: 10/17/2018 11:53:49 AM By: Gretta Cool, BSN, RN, CWS, Kim RN, BSN Entered By: Gretta Cool, BSN, RN, CWS, Kim on 10/16/2018 16:44:10 Skirvin, Jonette Eva ([id number]) -------------------------------------------------------------------------------- Encounter Discharge Information Details Patient Name: [name]. Date of Service: 10/16/2018 3:45 PM Medical Record Number: [id number] Patient Account Number: [id number] Date of Birth/Sex: 29-Aug-1966 (53 y.o. M) Treating RN: Cornell Barman Primary Care Silvana Holecek: [name] Other Clinician: Referring Kasumi Ditullio: [name] Treating  Zarin Hagmann/Extender: Melburn Hake, HOYT Weeks in Treatment: 6 Encounter Discharge Information Items Discharge Condition: Stable Ambulatory Status: Ambulatory Discharge Destination: Home Transportation: Private Auto Accompanied By: self Schedule Follow-up Appointment: Yes Clinical Summary of Care: Electronic Signature(s) Signed: 10/17/2018 11:53:49 AM By: Gretta Cool, BSN, RN, CWS, Kim RN, BSN Entered By: Gretta Cool, BSN, RN, CWS, Kim on 10/16/2018 16:49:01 Laureano, Jonette Eva ([id number]) -------------------------------------------------------------------------------- Lower Extremity Assessment Details Patient Name: [name]. Date of Service: 10/16/2018 3:45 PM Medical Record Number: [id number] Patient Account Number: [id number] Date of Birth/Sex: [birth year] (53 y.o. M) Treating RN: Montey Hora Primary Care Domanik Rainville: [name] Other Clinician: Referring Venesha Petraitis: [name] Treating Linley Moskal/Extender: Melburn Hake, HOYT Weeks in Treatment: 6 Edema Assessment Assessed: [Left: No] [Right: No] Edema: [Left: Ye] [Right: s] Calf Left: Right: Point of Measurement: 34 cm From Medial Instep cm 47 cm Ankle Left: Right: Point of Measurement: 15 cm From Medial Instep cm 29.7 cm Vascular Assessment Pulses: Dorsalis Pedis Palpable: [Right:Yes] Posterior Tibial Extremity colors, hair growth, and conditions: Extremity Color: [Right:Hyperpigmented] Hair Growth on Extremity: [Right:No] Temperature of Extremity: [Right:Warm] Capillary Refill: [Right:< 3 seconds] Toe Nail Assessment Left: Right: Thick: Yes Discolored: Yes Deformed: No Improper Length and Hygiene: Yes Electronic Signature(s) Signed: 10/16/2018 5:02:11 PM By: Montey Hora Entered By: Montey Hora on 10/16/2018 16:17:59 Bovee, Jonette Eva ([id number]) -------------------------------------------------------------------------------- Multi Wound Chart Details Patient Name: [name]. Date of Service: 10/16/2018 3:45 PM Medical  Record Number: [id number] Patient Account Number: [id number] Date of Birth/Sex: 1966/03/25 (53 y.o. M) Treating RN: Cornell Barman Primary Care Iliza Blankenbeckler: [name] Other Clinician: Referring Johnda Billiot: [name] Treating Daziyah Cogan/Extender: Melburn Hake, HOYT Weeks in Treatment: 6 Vital Signs Height(in): 68 Pulse(bpm): 79 Weight(lbs): 389 Blood Pressure(mmHg): 137/73 Body Mass Index(BMI): 59 Temperature(F): 98.1 Respiratory Rate 18 (breaths/min): Photos: [N/A:N/A] Wound Location: Right Lower Leg - Lateral N/A N/A Wounding Event:  Gradually Appeared N/A N/A Primary Etiology: Diabetic Wound/Ulcer of the N/A N/A Lower Extremity Comorbid History: Hypertension, Type II N/A N/A Diabetes, Gout Date Acquired: 06/06/2018 N/A N/A Weeks of Treatment: 6 N/A N/A Wound Status: Open N/A N/A Measurements L x W x D 1.1x0.4x0.1 N/A N/A (cm) Area (cm) : 0.346 N/A N/A Volume (cm) : 0.035 N/A N/A % Reduction in Area: 91.20% N/A N/A % Reduction in Volume: 91.10% N/A N/A Classification: Grade 2 N/A N/A Exudate Amount: Medium N/A N/A Exudate Type: Serous N/A N/A Exudate Color: amber N/A N/A Wound Margin: Distinct, outline attached N/A N/A Granulation Amount: Large (67-100%) N/A N/A Necrotic Amount: Small (1-33%) N/A N/A Exposed Structures: Fat Layer (Subcutaneous N/A N/A Tissue) Exposed: Yes Fascia: No Tendon: No Muscle: No Joint: No Bone: No Biehler, Toriano Z. ([id number]) Epithelialization: Small (1-33%) N/A N/A Periwound Skin Texture: Excoriation: No N/A N/A Induration: No Callus: No Crepitus: No Rash: No Scarring: No Periwound Skin Moisture: Dry/Scaly: Yes N/A N/A Maceration: No Periwound Skin Color: Hemosiderin Staining: Yes N/A N/A Atrophie Blanche: No Cyanosis: No Ecchymosis: No Erythema: No Mottled: No Pallor: No Rubor: No Tenderness on Palpation: Yes N/A N/A Wound Preparation: Ulcer Cleansing: N/A N/A Rinsed/Irrigated with Saline Topical Anesthetic  Applied: Other: lidocaine 4% Treatment Notes Electronic Signature(s) Signed: 10/17/2018 11:53:49 AM By: Gretta Cool, BSN, RN, CWS, Kim RN, BSN Entered By: Gretta Cool, BSN, RN, CWS, Kim on 10/16/2018 16:42:37 Shropshire, Jonette Eva ([id number]) -------------------------------------------------------------------------------- Multi-Disciplinary Care Plan Details Patient Name: [name]. Date of Service: 10/16/2018 3:45 PM Medical Record Number: [id number] Patient Account Number: [id number] Date of Birth/Sex: [birth year] (53 y.o. M) Treating RN: Cornell Barman Primary Care Wright Gravely: [name] Other Clinician: Referring Makailey Hodgkin: [name] Treating Yazmyn Valbuena/Extender: Melburn Hake, HOYT Weeks in Treatment: 6 Active Inactive Venous Leg Ulcer Nursing Diagnoses: Knowledge deficit related to disease process and management Goals: Patient will maintain optimal edema control Date Initiated: 09/04/2018 Target Resolution Date: 10/05/2018 Goal Status: Active Patient/caregiver will verbalize understanding of disease process and disease management Date Initiated: 09/04/2018 Target Resolution Date: 10/05/2018 Goal Status: Active Interventions: Provide education on venous insufficiency Notes: Wound/Skin Impairment Nursing Diagnoses: Impaired tissue integrity Goals: Ulcer/skin breakdown will have a volume reduction of 30% by week 4 Date Initiated: 09/04/2018 Target Resolution Date: 10/05/2018 Goal Status: Active Interventions: Assess patient/caregiver ability to obtain necessary supplies Assess patient/caregiver ability to perform ulcer/skin care regimen upon admission and as needed Notes: Electronic Signature(s) Signed: 10/17/2018 11:53:49 AM By: Gretta Cool, BSN, RN, CWS, Kim RN, BSN Entered By: Gretta Cool, BSN, RN, CWS, Kim on 10/16/2018 16:42:30 Wan, Jonette Eva ([id number]) -------------------------------------------------------------------------------- Pain Assessment Details Patient Name: [name]. Date of  Service: 10/16/2018 3:45 PM Medical Record Number: [id number] Patient Account Number: [id number] Date of Birth/Sex: 1966/01/06 (53 y.o. M) Treating RN: Montey Hora Primary Care Talene Glastetter: [name] Other Clinician: Referring Yasuo Phimmasone: [name] Treating Noely Kuhnle/Extender: Melburn Hake, HOYT Weeks in Treatment: 6 Active Problems Location of Pain Severity and Description of Pain Patient Has Paino Yes Site Locations Pain Location: Pain in Ulcers With Dressing Change: Yes Duration of the Pain. Constant / Intermittento Intermittent Pain Management and Medication Current Pain Management: Electronic Signature(s) Signed: 10/16/2018 5:02:11 PM By: Montey Hora Entered By: Montey Hora on 10/16/2018 16:15:32 Iglehart, Jonette Eva ([id number]) -------------------------------------------------------------------------------- Patient/Caregiver Education Details Patient Name: [name]. Date of Service: 10/16/2018 3:45 PM Medical Record Number: [id number] Patient Account Number: [id number] Date of Birth/Gender: 07/09/1966 (53 y.o. M) Treating RN: Cornell Barman Primary Care Physician: [name] Other Clinician: Referring Physician: [name] Treating Physician/Extender:  STONE III, HOYT Weeks in Treatment: 6 Education Assessment Education Provided To: Patient Education Topics Provided Tissue Oxygenation: Venous: Handouts: Other: Patient to wear compression daily Methods: Demonstration, Explain/Verbal Responses: State content correctly Wound/Skin Impairment: Handouts: Caring for Your Ulcer Methods: Demonstration, Explain/Verbal Responses: State content correctly Electronic Signature(s) Signed: 10/17/2018 11:53:49 AM By: Gretta Cool, BSN, RN, CWS, Kim RN, BSN Entered By: Gretta Cool, BSN, RN, CWS, Kim on 10/16/2018 16:47:52 Punch, Jonette Eva ([id number]) -------------------------------------------------------------------------------- Wound Assessment Details Patient Name: [name]. Date of Service: 10/16/2018 3:45 PM Medical Record Number: [id number] Patient Account Number: [id number] Date of Birth/Sex: 1966-06-29 (53 y.o. M) Treating RN: Montey Hora Primary Care Alazae Crymes: [name] Other Clinician: Referring Boluwatife Mutchler: [name] Treating Frans Valente/Extender: Melburn Hake, HOYT Weeks in Treatment: 6 Wound Status Wound Number: 1 Primary Etiology: Diabetic Wound/Ulcer of the Lower Extremity Wound Location: Right Lower Leg - Lateral Wound Status: Open Wounding Event: Gradually Appeared Comorbid Hypertension, Type II Diabetes, Gout Date Acquired: 06/06/2018 History: Weeks Of Treatment: 6 Clustered Wound: No Photos Photo Uploaded By: Montey Hora on 10/16/2018 16:26:04 Wound Measurements Length: (cm) 1.1 Width: (cm) 0.4 Depth: (cm) 0.1 Area: (cm) 0.346 Volume: (cm) 0.035 % Reduction in Area: 91.2% % Reduction in Volume: 91.1% Epithelialization: Small (1-33%) Tunneling: No Undermining: No Wound Description Classification: Grade 2 Wound Margin: Distinct, outline attached Exudate Amount: Medium Exudate Type: Serous Exudate Color: amber Foul Odor After Cleansing: No Slough/Fibrino Yes Wound Bed Granulation Amount: Large (67-100%) Exposed Structure Necrotic Amount: Small (1-33%) Fascia Exposed: No Necrotic Quality: Adherent Slough Fat Layer (Subcutaneous Tissue) Exposed: Yes Tendon Exposed: No Muscle Exposed: No Joint Exposed: No Bone Exposed: No Periwound Skin Texture Episcopo, Devan Z. ([id number]) Texture Color No Abnormalities Noted: No No Abnormalities Noted: No Callus: No Atrophie Blanche: No Crepitus: No Cyanosis: No Excoriation: No Ecchymosis: No Induration: No Erythema: No Rash: No Hemosiderin Staining: Yes Scarring: No Mottled: No Pallor: No Moisture Rubor: No No Abnormalities Noted: No Dry / Scaly: Yes Temperature / Pain Maceration: No Tenderness on Palpation: Yes Wound Preparation Ulcer Cleansing:  Rinsed/Irrigated with Saline Topical Anesthetic Applied: Other: lidocaine 4%, Treatment Notes Wound #1 (Right, Lateral Lower Leg) Notes prisma, BFD Electronic Signature(s) Signed: 10/16/2018 5:02:11 PM By: Montey Hora Entered By: Montey Hora on 10/16/2018 16:16:38 Sheffer, Jonette Eva ([id number]) -------------------------------------------------------------------------------- Vitals Details Patient Name: [name]. Date of Service: 10/16/2018 3:45 PM Medical Record Number: [id number] Patient Account Number: [id number] Date of Birth/Sex: [birth year] (53 y.o. M) Treating RN: Montey Hora Primary Care Christyann Manolis: [name] Other Clinician: Referring Dewel Lotter: [name] Treating Donnamaria Shands/Extender: Melburn Hake, HOYT Weeks in Treatment: 6 Vital Signs Time Taken: 16:18 Temperature (F): 98.1 Height (in): 68 Pulse (bpm): 79 Weight (lbs): 389 Respiratory Rate (breaths/min): 18 Body Mass Index (BMI): 59.1 Blood Pressure (mmHg): 137/73 Reference Range: 80 - 120 mg / dl Airway Electronic Signature(s) Signed: 10/16/2018 5:02:11 PM By: Montey Hora Entered By: Montey Hora on 10/16/2018 16:22:57

## 2018-10-18 NOTE — Progress Notes (Signed)
[name], [name] ([id number]) Visit Report for 10/16/2018 Chief Complaint Document Details Patient Name: [name], [name]. Date of Service: 10/16/2018 3:45 PM Medical Record Number: [id number] Patient Account Number: [id number] Date of Birth/Sex: Apr 30, 1966 (53 y.o. M) Treating RN: Cornell Barman Primary Care Provider: [name] Other Clinician: Referring Provider: [name] Treating Provider/Extender: Melburn Hake, Elyssa Pendelton Weeks in Treatment: 6 Information Obtained from: Patient Chief Complaint Right lower leg ulcer Electronic Signature(s) Signed: 10/17/2018 5:08:21 PM By: Worthy Keeler PA-C Entered By: Worthy Keeler on 10/16/2018 61:60:73 [name] ([id number]) -------------------------------------------------------------------------------- HPI Details Patient Name: [name]. Date of Service: 10/16/2018 3:45 PM Medical Record Number: [id number] Patient Account Number: [id number] Date of Birth/Sex: May 19, 1966 (53 y.o. M) Treating RN: Cornell Barman Primary Care Provider: [name] Other Clinician: Referring Provider: [name] Treating Provider/Extender: Melburn Hake, Kayleana Waites Weeks in Treatment: 6 History of Present Illness HPI Description: 53 year old male with past medical history of diabetes type 2, HTN, obesity, former smoker, and chronic narcotic use. Present today for right lower leg ulcer. He states that he first noticed wound back in October 2019. At that time he felt that he was bitten by something. There was no OTC treatment. Over time the wound became larger with increased pain. He was seen by [facility] and started on Keflex antibiotics for cellulitis for 14 days. Finished abx 2-3 days ago. Chronic bilateral knee pain. HA1C in November 2019 was 9.6. He is independent of ADLs however, he is unable to reach the right leg wound area. Left ABI in clinic is 0.94, right is non-compressible. 09/11/17 on evaluation today patient has a wound over the right lower  extremity which appears to be extremely painful for him. He states at this time that the pain is a 10 out of 10 currently. Nonetheless there does not appear to be any signs of infection. He still does not have any insurance at this point and therefore a vascular evaluation is not possible he tells me. Obviously I do think that with his right lower extremity be noncompressible we really need to have vascular evaluation order to consider any type of wrap or compressive therapy. 09/19/18 on evaluation today patient appears to be doing a little better in regard to his right lateral lower Trinity ulcer. He has been tolerating the dressing changes without complication. Fortunately there is no sign of infection at this time. He has been tolerating the dressing changes without complication. He does have significant fluid buildup of the right lower extremity but at this point no wraps are being performed as he was noncompressible during initial evaluation. He also has not wanted [place] to proceed with a vascular referral for arterial studies due to cost. He does not currently have insurance. 09/28/18 on evaluation today patient appears to be doing well in regard to his right lateral lower extremity ulcer. He's been tolerating the dressing changes without complication. Fortunately there is good signs of improvement which is excellent. 10/10/18 on evaluation today patient appears to be doing rather well in regard to his right lateral lower extremity ulcer. He has noted signs of improvement which is good news in fact some of the lower wounds of actually completely closed off which is excellent news. Fortunately there is no evidence of infection at this time. 10/16/18 on evaluation today patient's right lower extremity ulcer actually appears to be showing signs of good improvement which is excellent news. He's been tolerating the dressing changes without complication. Fortunately there's no sign  of infection at this  time. He still has discomfort which he states is about the same. Electronic Signature(s) Signed: 10/17/2018 5:08:21 PM By: Worthy Keeler PA-C Entered By: Worthy Keeler on 10/17/2018 11:14:17 Sheer, Jonette Eva ([id number]) -------------------------------------------------------------------------------- Physical Exam Details Patient Name: [name]. Date of Service: 10/16/2018 3:45 PM Medical Record Number: [id number] Patient Account Number: [id number] Date of Birth/Sex: 12-Dec-1965 (53 y.o. M) Treating RN: Cornell Barman Primary Care Provider: [name] Other Clinician: Referring Provider: [name] Treating Provider/Extender: STONE III, Yuri Fana Weeks in Treatment: 6 Constitutional Well-nourished and well-hydrated in no acute distress. Respiratory normal breathing without difficulty. clear to auscultation bilaterally. Cardiovascular regular rate and rhythm with normal S1, S2. Psychiatric this patient is able to make decisions and demonstrates good insight into disease process. Alert and Oriented x 3. pleasant and cooperative. Notes Patient's wound bed currently shows signs of good improvement as far as the granulation and epithelialization is concerned. No sharp debridement was actually required today as things appear to be doing so well. He does have a lot of fluid and swelling he does not appear to be wearing his compression stockings according to what I see. He states he is but again I've never actually seen him with them on. Electronic Signature(s) Signed: 10/17/2018 5:08:21 PM By: Worthy Keeler PA-C Entered By: Worthy Keeler on 10/17/2018 11:21:38 [name] ([id number]) -------------------------------------------------------------------------------- Physician Orders Details Patient Name: [name]. Date of Service: 10/16/2018 3:45 PM Medical Record Number: [id number] Patient Account Number: [id number] Date of Birth/Sex: 25-Sep-1965 (53 y.o. M) Treating RN: Cornell Barman Primary Care Provider: [name] Other Clinician: Referring Provider: [name] Treating Provider/Extender: Melburn Hake, Dak Szumski Weeks in Treatment: 6 Verbal / Phone Orders: No Diagnosis Coding ICD-10 Coding Code Description E11.622 Type 2 diabetes mellitus with other skin ulcer L97.812 Non-pressure chronic ulcer of other part of right lower leg with fat layer exposed I10 Essential (primary) hypertension E66.01 Morbid (severe) obesity due to excess calories Wound Cleansing Wound #1 Right,Lateral Lower Leg o May Shower, gently pat wound dry prior to applying new dressing. Anesthetic (add to Medication List) Wound #1 Right,Lateral Lower Leg o Topical Lidocaine 4% cream applied to wound bed prior to debridement (In Clinic Only). Primary Wound Dressing Wound #1 Right,Lateral Lower Leg o Silver Collagen Secondary Dressing Wound #1 Right,Lateral Lower Leg o Boardered Foam Dressing Dressing Change Frequency Wound #1 Right,Lateral Lower Leg o Change dressing every other day. Follow-up Appointments Wound #1 Right,Lateral Lower Leg o Return Appointment in 1 week. Edema Control Wound #1 Right,Lateral Lower Leg o Patient to wear own compression stockings Electronic Signature(s) Signed: 10/17/2018 11:53:49 AM By: Gretta Cool, BSN, RN, CWS, Kim RN, BSN Signed: 10/17/2018 5:08:21 PM By: Worthy Keeler PA-C Entered By: Gretta Cool, BSN, RN, CWS, Kim on 10/16/2018 16:43:26 Rubendall, Jonette Eva ([id number]) [name] ([id number]) -------------------------------------------------------------------------------- Problem List Details Patient Name: [name]. Date of Service: 10/16/2018 3:45 PM Medical Record Number: [id number] Patient Account Number: [id number] Date of Birth/Sex: [birth year] (53 y.o. M) Treating RN: Cornell Barman Primary Care Provider: [name] Other Clinician: Referring Provider: [name] Treating Provider/Extender: Melburn Hake, Jozelynn Danielson Weeks in  Treatment: 6 Active Problems ICD-10 Evaluated Encounter Code Description Active Date Today Diagnosis E11.622 Type 2 diabetes mellitus with other skin ulcer 09/04/2018 No Yes L97.812 Non-pressure chronic ulcer of other part of right lower leg 09/04/2018 No Yes with fat layer exposed [place] (primary) hypertension 09/04/2018 No Yes E66.01 Morbid (severe) obesity due to excess calories 09/04/2018  No Yes Inactive Problems Resolved Problems Electronic Signature(s) Signed: 10/17/2018 5:08:21 PM By: Worthy Keeler PA-C Entered By: Worthy Keeler on 10/16/2018 16:26:17 Hyson, Jonette Eva ([id number]) -------------------------------------------------------------------------------- Progress Note Details Patient Name: [name]. Date of Service: 10/16/2018 3:45 PM Medical Record Number: [id number] Patient Account Number: [id number] Date of Birth/Sex: 1966/06/06 (53 y.o. M) Treating RN: Cornell Barman Primary Care Provider: [name] Other Clinician: Referring Provider: [name] Treating Provider/Extender: Melburn Hake, Matisyn Cabeza Weeks in Treatment: 6 Subjective Chief Complaint Information obtained from Patient Right lower leg ulcer History of Present Illness (HPI) 53 year old male with past medical history of diabetes type 2, HTN, obesity, former smoker, and chronic narcotic use. Present today for right lower leg ulcer. He states that he first noticed wound back in October 2019. At that time he felt that he was bitten by something. There was no OTC treatment. Over time the wound became larger with increased pain. He was seen by [facility] urgent care and started on Keflex antibiotics for cellulitis for 14 days. Finished abx 2-3 days ago. Chronic bilateral knee pain. HA1C in November 2019 was 9.6. He is independent of ADLs however, he is unable to reach the right leg wound area. Left ABI in clinic is 0.94, right is non-compressible. 09/11/17 on evaluation today patient has a wound over the  right lower extremity which appears to be extremely painful for him. He states at this time that the pain is a 10 out of 10 currently. Nonetheless there does not appear to be any signs of infection. He still does not have any insurance at this point and therefore a vascular evaluation is not possible he tells me. Obviously I do think that with his right lower extremity be noncompressible we really need to have vascular evaluation order to consider any type of wrap or compressive therapy. 09/19/18 on evaluation today patient appears to be doing a little better in regard to his right lateral lower Trinity ulcer. He has been tolerating the dressing changes without complication. Fortunately there is no sign of infection at this time. He has been tolerating the dressing changes without complication. He does have significant fluid buildup of the right lower extremity but at this point no wraps are being performed as he was noncompressible during initial evaluation. He also has not wanted [place] to proceed with a vascular referral for arterial studies due to cost. He does not currently have insurance. 09/28/18 on evaluation today patient appears to be doing well in regard to his right lateral lower extremity ulcer. He's been tolerating the dressing changes without complication. Fortunately there is good signs of improvement which is excellent. 10/10/18 on evaluation today patient appears to be doing rather well in regard to his right lateral lower extremity ulcer. He has noted signs of improvement which is good news in fact some of the lower wounds of actually completely closed off which is excellent news. Fortunately there is no evidence of infection at this time. 10/16/18 on evaluation today patient's right lower extremity ulcer actually appears to be showing signs of good improvement which is excellent news. He's been tolerating the dressing changes without complication. Fortunately there's no sign of infection  at this time. He still has discomfort which he states is about the same. Patient History Information obtained from Patient. Social History Former smoker - started on 09/15/2002 - ended on 09/06/2012, Marital Status - Single, Alcohol Use - Never, Drug Use - No History, Caffeine Use - Daily - sodas. Medical  History Cardiovascular Patient has history of Hypertension ALEXES, LAMARQUE ([id number]) Endocrine Patient has history of Type II Diabetes - DX age 45A1C 9.6 Genitourinary Denies history of End Stage Renal Disease Musculoskeletal Patient has history of Gout Oncologic Denies history of Received Chemotherapy, Received Radiation Medical And Surgical History Notes Musculoskeletal Arthritis Review of Systems (ROS) Constitutional Symptoms (General Health) Denies complaints or symptoms of Fever, Chills. Respiratory The patient has no complaints or symptoms. Cardiovascular Complains or has symptoms of LE edema. Psychiatric The patient has no complaints or symptoms. Objective Constitutional Well-nourished and well-hydrated in no acute distress. Vitals Time Taken: 4:18 PM, Height: 68 in, Weight: 389 lbs, BMI: 59.1, Temperature: 98.1 F, Pulse: 79 bpm, Respiratory Rate: 18 breaths/min, Blood Pressure: 137/73 mmHg. Respiratory normal breathing without difficulty. clear to auscultation bilaterally. Cardiovascular regular rate and rhythm with normal S1, S2. Psychiatric this patient is able to make decisions and demonstrates good insight into disease process. Alert and Oriented x 3. pleasant and cooperative. General Notes: Patient's wound bed currently shows signs of good improvement as far as the granulation and epithelialization is concerned. No sharp debridement was actually required today as things appear to be doing so well. He does have a lot of fluid and swelling he does not appear to be wearing his compression stockings according to what I see. He states he is but again I've never  actually seen him with them on. Integumentary (Hair, Skin) Wound #1 status is Open. Original cause of wound was Gradually Appeared. The wound is located on the Right,Lateral Lower Leg. The wound measures 1.1cm length x 0.4cm width x 0.1cm depth; 0.346cm^2 area and 0.035cm^3 volume. There Paulsen, Xane Z. ([id number]) is Fat Layer (Subcutaneous Tissue) Exposed exposed. There is no tunneling or undermining noted. There is a medium amount of serous drainage noted. The wound margin is distinct with the outline attached to the wound base. There is large (67-100%) granulation within the wound bed. There is a small (1-33%) amount of necrotic tissue within the wound bed including Adherent Slough. The periwound skin appearance exhibited: Dry/Scaly, Hemosiderin Staining. The periwound skin appearance did not exhibit: Callus, Crepitus, Excoriation, Induration, Rash, Scarring, Maceration, Atrophie Blanche, Cyanosis, Ecchymosis, Mottled, Pallor, Rubor, Erythema. The periwound has tenderness on palpation. Assessment Active Problems ICD-10 Type 2 diabetes mellitus with other skin ulcer Non-pressure chronic ulcer of other part of right lower leg with fat layer exposed Essential (primary) hypertension Morbid (severe) obesity due to excess calories Plan Wound Cleansing: Wound #1 Right,Lateral Lower Leg: May Shower, gently pat wound dry prior to applying new dressing. Anesthetic (add to Medication List): Wound #1 Right,Lateral Lower Leg: Topical Lidocaine 4% cream applied to wound bed prior to debridement (In Clinic Only). Primary Wound Dressing: Wound #1 Right,Lateral Lower Leg: Silver Collagen Secondary Dressing: Wound #1 Right,Lateral Lower Leg: Boardered Foam Dressing Dressing Change Frequency: Wound #1 Right,Lateral Lower Leg: Change dressing every other day. Follow-up Appointments: Wound #1 Right,Lateral Lower Leg: Return Appointment in 1 week. Edema Control: Wound #1 Right,Lateral Lower  Leg: Patient to wear own compression stockings My suggestion currently is gonna be that we continue with the above wound care measures for the next week and the patient is in agreement that plan. We will subsequently see were things stand at follow-up. Anything changes or worsens in the meantime he will contact the office and let me know. Please see above for specific wound care orders. We will see patient for re-evaluation in 1 week(s) here in the clinic. If anything worsens or  changes patient will contact our office for additional recommendations. OSBALDO, MARK ([id number]) Electronic Signature(s) Signed: 10/17/2018 5:08:21 PM By: Worthy Keeler PA-C Entered By: Worthy Keeler on 10/17/2018 11:22:23 [name] ([id number]) -------------------------------------------------------------------------------- ROS/PFSH Details Patient Name: [name]. Date of Service: 10/16/2018 3:45 PM Medical Record Number: [id number] Patient Account Number: [id number] Date of Birth/Sex: 18-May-1966 (53 y.o. M) Treating RN: Cornell Barman Primary Care Provider: [name] Other Clinician: Referring Provider: [name] Treating Provider/Extender: Melburn Hake, Vincenza Dail Weeks in Treatment: 6 Information Obtained From Patient Wound History Do you currently have one or more open woundso Yes How many open wounds do you currently haveo 1 Approximately how long have you had your woundso 06/06/2018 How have you been treating your wound(s) until nowo antibiotic(Keflex), soap and water Has your wound(s) ever healed and then re-openedo No Have you had any lab work done in the past montho No Have you tested positive for an antibiotic resistant organism (MRSA, VRE)o No Have you tested positive for osteomyelitis (bone infection)o No Have you had any tests for circulation on your legso No Have you had other problems associated with your woundso Swelling Constitutional Symptoms (General Health) Complaints and  Symptoms: Negative for: Fever; Chills Cardiovascular Complaints and Symptoms: Positive for: LE edema Medical History: Positive for: Hypertension Respiratory Complaints and Symptoms: No Complaints or Symptoms Endocrine Medical History: Positive for: Type II Diabetes - DX age 31A1C 9.6 Time with diabetes: 3 years Treated with: Insulin, Oral agents Blood sugar tested every day: Yes Tested : Genitourinary Medical History: Negative for: End Stage Renal Disease Musculoskeletal ARVEL, OQUINN ([id number]) Medical History: Positive for: Gout Past Medical History Notes: Arthritis Oncologic Medical History: Negative for: Received Chemotherapy; Received Radiation Psychiatric Complaints and Symptoms: No Complaints or Symptoms Immunizations Pneumococcal Vaccine: Received Pneumococcal Vaccination: No Implantable Devices Family and Social History Former smoker - started on 09/15/2002 - ended on 09/06/2012; Marital Status - Single; Alcohol Use: Never; Drug Use: No History; Caffeine Use: Daily - sodas; Financial Concerns: No; Food, Clothing or Shelter Needs: No; Support System Lacking: No; Transportation Concerns: No; Advanced Directives: No; Patient does not want information on Advanced Directives; Living Will: No; Medical Power of Attorney: No Physician Affirmation I have reviewed and agree with the above information. Electronic Signature(s) Signed: 10/17/2018 11:53:49 AM By: Gretta Cool, BSN, RN, CWS, Kim RN, BSN Signed: 10/17/2018 5:08:21 PM By: Worthy Keeler PA-C Entered By: Worthy Keeler on 10/17/2018 11:14:38 Mealor, Jonette Eva ([id number]) -------------------------------------------------------------------------------- SuperBill Details Patient Name: [name]. Date of Service: 10/16/2018 Medical Record Number: [id number] Patient Account Number: [id number] Date of Birth/Sex: 26-Apr-1966 (53 y.o. M) Treating RN: Cornell Barman Primary Care Provider: [name] Other  Clinician: Referring Provider: [name] Treating Provider/Extender: Melburn Hake, Iviona Hole Weeks in Treatment: 6 Diagnosis Coding ICD-10 Codes Code Description E11.622 Type 2 diabetes mellitus with other skin ulcer L97.812 Non-pressure chronic ulcer of other part of right lower leg with fat layer exposed I10 Essential (primary) hypertension E66.01 Morbid (severe) obesity due to excess calories Facility Procedures CPT4 Code: [id number] Description: 09326 - WOUND CARE VISIT-LEV 2 EST PT Modifier: Quantity: 1 Physician Procedures CPT4 Code Description: [id number] - WC PHYS LEVEL 4 - EST PT ICD-10 Diagnosis Description E11.622 Type 2 diabetes mellitus with other skin ulcer L97.812 Non-pressure chronic ulcer of other part of right lower leg wi I10 Essential (primary)  hypertension E66.01 Morbid (severe) obesity due to excess calories Modifier: th fat layer expo Quantity: 1 sed Electronic Signature(s) Signed: 10/17/2018 5:08:21  PM By: Worthy Keeler PA-C Entered By: Worthy Keeler on 10/17/2018 11:22:37

## 2018-10-24 ENCOUNTER — Ambulatory Visit: Payer: Self-pay | Admitting: Physician Assistant

## 2018-10-31 ENCOUNTER — Ambulatory Visit: Payer: Self-pay | Admitting: Physician Assistant

## 2018-11-08 ENCOUNTER — Ambulatory Visit (INDEPENDENT_AMBULATORY_CARE_PROVIDER_SITE_OTHER): Payer: Self-pay | Admitting: Orthopaedic Surgery

## 2018-11-08 ENCOUNTER — Encounter: Payer: Self-pay | Admitting: Orthopaedic Surgery

## 2018-11-08 VITALS — BP 174/91 | HR 81 | Ht 65.5 in | Wt >= 6400 oz

## 2018-11-08 DIAGNOSIS — G8929 Other chronic pain: Secondary | ICD-10-CM

## 2018-11-08 DIAGNOSIS — M25561 Pain in right knee: Secondary | ICD-10-CM

## 2018-11-08 DIAGNOSIS — M25562 Pain in left knee: Secondary | ICD-10-CM

## 2018-11-08 DIAGNOSIS — Z6841 Body Mass Index (BMI) 40.0 and over, adult: Secondary | ICD-10-CM

## 2018-11-08 MED ORDER — OXYCODONE-ACETAMINOPHEN 10-325 MG PO TABS: 1.0000 | ORAL_TABLET | ORAL | 0 refills | Status: DC | PRN

## 2018-11-08 NOTE — Progress Notes (Signed)
Patient [name], male DOB:05-24-1966, 53 y.o. GLO:[id number]  Chief Complaint  Patient presents with  . Knee Pain    HPI  [name] is a 53 y.o. male who has chronic bilateral knee pain.  He has no new trauma.  He has swelling and popping but no giving way.  He is taking his medicine.   Body mass index is 67.68 kg/m. The patient meets the AMA guidelines for Morbid (severe) obesity with a BMI > 40.0 and I have recommended weight loss.  He knows his massive obesity contributes to his knee pain.  He says he just cannot lose the weight.  He is trying.  He does not want to see anyone about it.  ROS  Review of Systems  Constitutional: Positive for activity change.  HENT: Negative for congestion.   Respiratory: Positive for shortness of breath. Negative for cough.   Cardiovascular: Positive for leg swelling. Negative for chest pain.  Endocrine: Negative for cold intolerance.  Musculoskeletal: Positive for arthralgias, back pain, gait problem, joint swelling and myalgias.  Allergic/Immunologic: Positive for environmental allergies.  Neurological: Negative for numbness.  All other systems reviewed and are negative.   All other systems reviewed and are negative.  The following is a summary of the past history medically, past history surgically, known current medicines, social history and family history.  This information is gathered electronically by the computer from prior information and documentation.  I review this each visit and have found including this information at this point in the chart is beneficial and informative.    Past Medical History:  Diagnosis Date  . Arthritis   . Diabetes mellitus without complication ([place])    diagnosed about age 64  . Gout   . Hypertension    boarderline    Past Surgical History:  Procedure Laterality Date  . HERNIA REPAIR  2008  . MASS EXCISION Left age 44   L thigh- benign    Family History  Problem Relation Age of Onset   . Cancer Mother   . Diabetes Mother   . Heart failure Father   . Hypertension Father   . Diabetes Father     Social History Social History   Tobacco Use  . Smoking status: Former Smoker    Packs/day: 0.50    Years: 5.00    Pack years: 2.50    Types: Cigarettes    Last attempt to quit: 1990    Years since quitting: 30.1  . Smokeless tobacco: Never Used  Substance Use Topics  . Alcohol use: No  . Drug use: No    No Known Allergies  Current Outpatient Medications  Medication Sig Dispense Refill  . furosemide (LASIX) 20 MG tablet Take 1 tablet (20 mg total) by mouth daily. 90 tablet 0  . insulin glargine (LANTUS) 100 UNIT/ML injection Inject 24 Units into the skin at bedtime.     [name] lisinopril (PRINIVIL,ZESTRIL) 20 MG tablet Take 1 tablet (20 mg total) by mouth daily. 90 tablet 1  . metFORMIN (GLUCOPHAGE) 1000 MG tablet Take 1 tablet (1,000 mg total) by mouth 2 (two) times daily with a meal. 180 tablet 1  . metoprolol tartrate (LOPRESSOR) 50 MG tablet Take 1 tablet (50 mg total) by mouth 2 (two) times daily. 180 tablet 1  . oxyCODONE-acetaminophen (PERCOCET) 10-325 MG tablet Take 1 tablet by mouth every 4 (four) hours as needed for pain. 170 tablet 0  . sitaGLIPtin (JANUVIA) 100 MG tablet Take 1 tablet (100 mg  total) by mouth daily. 90 tablet 0   No current facility-administered medications for this visit.      Physical Exam  Blood pressure (!) 174/91, pulse 81, height 5' 5.5" (1.664 m), weight (!) 413 lb (187.3 kg).  Constitutional: overall normal hygiene, normal nutrition, well developed, normal grooming, normal body habitus. Assistive device:none  Musculoskeletal: gait and station Limp right, muscle tone and strength are normal, no tremors or atrophy is present.  .  Neurological: coordination overall normal.  Deep tendon reflex/nerve stretch intact.  Sensation normal.  Cranial nerves II-XII intact.   Skin:   Normal overall no scars, lesions, ulcers or rashes. No  psoriasis.  Psychiatric: Alert and oriented x 3.  Recent memory intact, remote memory unclear.  Normal mood and affect. Well groomed.  Good eye contact.  Cardiovascular: overall no swelling, no varicosities, no edema bilaterally, normal temperatures of the legs and arms, no clubbing, cyanosis and good capillary refill.  Lymphatic: palpation is normal.  Both knees have effusion, crepitus, ROM 0 to 100 right, 0 to 105 left, NV intact.  More of a limp to the right.  All other systems reviewed and are negative   The patient has been educated about the nature of the problem(s) and counseled on treatment options.  The patient appeared to understand what I have discussed and is in agreement with it.  Encounter Diagnoses  Name Primary?  . Chronic pain of right knee Yes  . Chronic pain of left knee   . Morbid obesity due to excess calories ([place])   . Body mass index 60.0-69.9, adult [facility])     PLAN Call if any problems.  Precautions discussed.  Continue current medications.   Return to clinic 3 months   The patient has read and signed an Opioid Treatment Agreement which has been scanned and added to the medical record.  The patient understands the agreement and agrees to abide with it.  The patient has chronic pain that is being treated with an opioid which relieves the pain.  The patient understands potential complications with chronic opioid treatment.   Electronically Signed Sanjuana Kava, MD 3/4/20203:01 PM

## 2018-11-09 ENCOUNTER — Encounter: Payer: Self-pay | Attending: Physician Assistant | Admitting: Physician Assistant

## 2018-11-09 DIAGNOSIS — L97812 Non-pressure chronic ulcer of other part of right lower leg with fat layer exposed: Secondary | ICD-10-CM | POA: Insufficient documentation

## 2018-11-09 DIAGNOSIS — E11622 Type 2 diabetes mellitus with other skin ulcer: Secondary | ICD-10-CM | POA: Insufficient documentation

## 2018-11-09 DIAGNOSIS — Z7984 Long term (current) use of oral hypoglycemic drugs: Secondary | ICD-10-CM | POA: Insufficient documentation

## 2018-11-09 DIAGNOSIS — I10 Essential (primary) hypertension: Secondary | ICD-10-CM | POA: Insufficient documentation

## 2018-11-09 DIAGNOSIS — Z6841 Body Mass Index (BMI) 40.0 and over, adult: Secondary | ICD-10-CM | POA: Insufficient documentation

## 2018-11-09 DIAGNOSIS — Z87891 Personal history of nicotine dependence: Secondary | ICD-10-CM | POA: Insufficient documentation

## 2018-11-12 NOTE — Progress Notes (Signed)
[name], [name] ([id number]) Visit Report for 11/09/2018 Arrival Information Details Patient Name: [name], [name]. Date of Service: 11/09/2018 1:30 PM Medical Record Number: [id number] Patient Account Number: [id number] Date of Birth/Sex: 19-Jan-1966 (53 y.o. M) Treating RN: Montey Hora Primary Care Elody Kleinsasser: [name] Other Clinician: Referring Kristien Salatino: [name] Treating Marvis Bakken/Extender: Melburn Hake, HOYT Weeks in Treatment: 9 Visit Information History Since Last Visit Added or deleted any medications: No Patient Arrived: Ambulatory Any new allergies or adverse reactions: No Arrival Time: 13:51 Had a fall or experienced change in No Accompanied By: self activities of daily living that may affect Transfer Assistance: None risk of falls: Patient Identification Verified: Yes Signs or symptoms of abuse/neglect since last visito No Secondary Verification Process Completed: Yes Hospitalized since last visit: No Implantable device outside of the clinic excluding No cellular tissue based products placed in the center since last visit: Has Dressing in Place as Prescribed: Yes Has Compression in Place as Prescribed: No Pain Present Now: No Electronic Signature(s) Signed: 11/09/2018 4:54:37 PM By: Montey Hora Entered By: Montey Hora on 11/09/2018 13:51:18 [name], [name] ([id number]) -------------------------------------------------------------------------------- Clinic Level of Care Assessment Details Patient Name: [name]. Date of Service: 11/09/2018 1:30 PM Medical Record Number: [id number] Patient Account Number: [id number] Date of Birth/Sex: 05-24-1966 (53 y.o. M) Treating RN: Army Melia Primary Care Markevion Lattin: [name] Other Clinician: Referring Havilah Topor: [name] Treating Victory Strollo/Extender: Melburn Hake, HOYT Weeks in Treatment: 9 Clinic Level of Care Assessment Items TOOL 4 Quantity Score X - Use when only an EandM is performed on FOLLOW-UP visit  1 0 ASSESSMENTS - Nursing Assessment / Reassessment X - Reassessment of Co-morbidities (includes updates in patient status) 1 10 X- 1 5 Reassessment of Adherence to Treatment Plan ASSESSMENTS - Wound and Skin Assessment / Reassessment X - Simple Wound Assessment / Reassessment - one wound 1 5 []  - 0 Complex Wound Assessment / Reassessment - multiple wounds []  - 0 Dermatologic / Skin Assessment (not related to wound area) ASSESSMENTS - Focused Assessment []  - Circumferential Edema Measurements - multi extremities 0 []  - 0 Nutritional Assessment / Counseling / Intervention []  - 0 Lower Extremity Assessment (monofilament, tuning fork, pulses) []  - 0 Peripheral Arterial Disease Assessment (using hand held doppler) ASSESSMENTS - Ostomy and/or Continence Assessment and Care []  - Incontinence Assessment and Management 0 []  - 0 Ostomy Care Assessment and Management (repouching, etc.) PROCESS - Coordination of Care X - Simple Patient / Family Education for ongoing care 1 15 []  - 0 Complex (extensive) Patient / Family Education for ongoing care []  - 0 Staff obtains Programmer, systems, Records, Test Results / Process Orders []  - 0 Staff telephones HHA, Nursing Homes / Clarify orders / etc []  - 0 Routine Transfer to another Facility (non-emergent condition) []  - 0 Routine Hospital Admission (non-emergent condition) []  - 0 New Admissions / Biomedical engineer / Ordering NPWT, Apligraf, etc. []  - 0 Emergency Hospital Admission (emergent condition) X- 1 10 Simple Discharge Coordination [name], MOONE. ([id number]) []  - 0 Complex (extensive) Discharge Coordination PROCESS - Special Needs []  - Pediatric / Minor Patient Management 0 []  - 0 Isolation Patient Management []  - 0 Hearing / Language / Visual special needs []  - 0 Assessment of Community assistance (transportation, D/C planning, etc.) []  - 0 Additional assistance / Altered mentation []  - 0 Support Surface(s) Assessment (bed,  cushion, seat, etc.) INTERVENTIONS - Wound Cleansing / Measurement X - Simple Wound Cleansing - one wound 1 5 []  - 0 Complex Wound Cleansing -  multiple wounds X- 1 5 Wound Imaging (photographs - any number of wounds) []  - 0 Wound Tracing (instead of photographs) X- 1 5 Simple Wound Measurement - one wound []  - 0 Complex Wound Measurement - multiple wounds INTERVENTIONS - Wound Dressings X - Small Wound Dressing one or multiple wounds 1 10 []  - 0 Medium Wound Dressing one or multiple wounds []  - 0 Large Wound Dressing one or multiple wounds []  - 0 Application of Medications - topical []  - 0 Application of Medications - injection INTERVENTIONS - Miscellaneous []  - External ear exam 0 []  - 0 Specimen Collection (cultures, biopsies, blood, body fluids, etc.) []  - 0 Specimen(s) / Culture(s) sent or taken to Lab for analysis []  - 0 Patient Transfer (multiple staff / Civil Service fast streamer / Similar devices) []  - 0 Simple Staple / Suture removal (25 or less) []  - 0 Complex Staple / Suture removal (26 or more) []  - 0 Hypo / Hyperglycemic Management (close monitor of Blood Glucose) []  - 0 Ankle / Brachial Index (ABI) - do not check if billed separately X- 1 5 Vital Signs [name], [name] ([id number]) Has the patient been seen at the hospital within the last three years: Yes Total Score: 75 Level Of Care: New/Established - Level 2 Electronic Signature(s) Signed: 11/10/2018 3:17:07 PM By: Army Melia Entered By: Army Melia on 11/09/2018 14:06:43 [name], [name] ([id number]) -------------------------------------------------------------------------------- Encounter Discharge Information Details Patient Name: [name]. Date of Service: 11/09/2018 1:30 PM Medical Record Number: [id number] Patient Account Number: [id number] Date of Birth/Sex: May 13, 1966 (53 y.o. M) Treating RN: Army Melia Primary Care [place]: [name] Other Clinician: Referring Malory Spurr: [name] Treating  Fendi Meinhardt/Extender: Melburn Hake, HOYT Weeks in Treatment: 9 Encounter Discharge Information Items Discharge Condition: Stable Ambulatory Status: Ambulatory Discharge Destination: Home Transportation: Private Auto Accompanied By: self Schedule Follow-up Appointment: Yes Clinical Summary of Care: Electronic Signature(s) Signed: 11/09/2018 2:21:20 PM By: Army Melia Entered By: Army Melia on 11/09/2018 14:21:20 [name], [name] ([id number]) -------------------------------------------------------------------------------- Lower Extremity Assessment Details Patient Name: [name]. Date of Service: 11/09/2018 1:30 PM Medical Record Number: [id number] Patient Account Number: [id number] Date of Birth/Sex: 1965/09/15 (53 y.o. M) Treating RN: Montey Hora Primary Care Anuoluwapo Mefferd: [name] Other Clinician: Referring Devean Skoczylas: [name] Treating Karolyna Bianchini/Extender: Melburn Hake, HOYT Weeks in Treatment: 9 Edema Assessment Assessed: [Left: No] [Right: No] Edema: [Left: Ye] [Right: s] Calf Left: Right: Point of Measurement: 34 cm From Medial Instep cm 46.8 cm Ankle Left: Right: Point of Measurement: 15 cm From Medial Instep cm 29.7 cm Vascular Assessment Pulses: Dorsalis Pedis Palpable: [Right:Yes] Posterior Tibial Extremity colors, hair growth, and conditions: Extremity Color: [Right:Normal] Hair Growth on Extremity: [Right:No] Temperature of Extremity: [Right:Warm] Capillary Refill: [Right:< 3 seconds] Electronic Signature(s) Signed: 11/09/2018 4:54:37 PM By: Montey Hora Entered By: Montey Hora on 11/09/2018 13:58:48 [name], [name] ([id number]) -------------------------------------------------------------------------------- Multi Wound Chart Details Patient Name: [name]. Date of Service: 11/09/2018 1:30 PM Medical Record Number: [id number] Patient Account Number: [id number] Date of Birth/Sex: Jul 27, 1966 (52 y.o. M) Treating RN: Army Melia Primary Care Tajah Noguchi:  [name] Other Clinician: Referring Willella Harding: [name] Treating Aliene Tamura/Extender: STONE III, HOYT Weeks in Treatment: 9 Vital Signs Height(in): 68 Pulse(bpm): 76 Weight(lbs): 389 Blood Pressure(mmHg): 137/72 Body Mass Index(BMI): 59 Temperature(F): 97.6 Respiratory Rate 16 (breaths/min): Photos: [1:No Photos] [N/A:N/A] Wound Location: [1:Right Lower Leg - Lateral] [N/A:N/A] Wounding Event: [1:Gradually Appeared] [N/A:N/A] Primary Etiology: [1:Diabetic Wound/Ulcer of the Lower Extremity] [N/A:N/A] Comorbid History: [1:Hypertension, Type II Diabetes, Gout] [N/A:N/A] Date  Acquired: [1:06/06/2018] [N/A:N/A] Weeks of Treatment: [1:9] [N/A:N/A] Wound Status: [1:Open] [N/A:N/A] Measurements L x W x D [1:0.1x0.1x0.1] [N/A:N/A] (cm) Area (cm) : [0:2.774] [N/A:N/A] Volume (cm) : [1:0.001] [N/A:N/A] % Reduction in Area: [1:99.80%] [N/A:N/A] % Reduction in Volume: [1:99.70%] [N/A:N/A] Classification: [1:Grade 2] [N/A:N/A] Exudate Amount: [1:None Present] [N/A:N/A] Wound Margin: [1:Distinct, outline attached] [N/A:N/A] Granulation Amount: [1:Large (67-100%)] [N/A:N/A] Necrotic Amount: [1:None Present (0%)] [N/A:N/A] Exposed Structures: [1:Fat Layer (Subcutaneous Tissue) Exposed: Yes Fascia: No Tendon: No Muscle: No Joint: No Bone: No] [N/A:N/A] Epithelialization: [1:Large (67-100%)] [N/A:N/A] Periwound Skin Texture: [1:Excoriation: No Induration: No Callus: No Crepitus: No Rash: No Scarring: No] [N/A:N/A] Periwound Skin Moisture: [1:Dry/Scaly: Yes Maceration: No] [N/A:N/A] Periwound Skin Color: Hemosiderin Staining: Yes N/A N/A Atrophie Blanche: No Cyanosis: No Ecchymosis: No Erythema: No Mottled: No Pallor: No Rubor: No Tenderness on Palpation: Yes N/A N/A Wound Preparation: Ulcer Cleansing: N/A N/A Rinsed/Irrigated with Saline Topical Anesthetic Applied: None Treatment Notes Electronic Signature(s) Signed: 11/10/2018 3:17:07 PM By: Army Melia Entered By: Army Melia on 11/09/2018 14:05:12 [name], [name] ([id number]) -------------------------------------------------------------------------------- [place] Details Patient Name: [name]. Date of Service: 11/09/2018 1:30 PM Medical Record Number: [id number] Patient Account Number: [id number] Date of Birth/Sex: 02/03/1966 (53 y.o. M) Treating RN: Army Melia Primary Care Reiley Keisler: [name] Other Clinician: Referring Thersa Mohiuddin: [name] Treating Jiro Kiester/Extender: Melburn Hake, HOYT Weeks in Treatment: 9 Active Inactive Venous Leg Ulcer Nursing Diagnoses: Knowledge deficit related to disease process and management Goals: Patient will maintain optimal edema control Date Initiated: 09/04/2018 Target Resolution Date: 10/05/2018 Goal Status: Active Patient/caregiver will verbalize understanding of disease process and disease management Date Initiated: 09/04/2018 Target Resolution Date: 10/05/2018 Goal Status: Active Interventions: Provide education on venous insufficiency Notes: Wound/Skin Impairment Nursing Diagnoses: Impaired tissue integrity Goals: Ulcer/skin breakdown will have a volume reduction of 30% by week 4 Date Initiated: 09/04/2018 Target Resolution Date: 10/05/2018 Goal Status: Active Interventions: Assess patient/caregiver ability to obtain necessary supplies Assess patient/caregiver ability to perform ulcer/skin care regimen upon admission and as needed Notes: Electronic Signature(s) Signed: 11/10/2018 3:17:07 PM By: Army Melia Entered By: Army Melia on 11/09/2018 14:04:55 [name], [name] ([id number]) -------------------------------------------------------------------------------- Pain Assessment Details Patient Name: [name]. Date of Service: 11/09/2018 1:30 PM Medical Record Number: [id number] Patient Account Number: [id number] Date of Birth/Sex: 12-06-1965 (53 y.o. M) Treating RN: Montey Hora Primary Care  Eyan Hagood: [name] Other Clinician: Referring Ercia Crisafulli: [name] Treating Pratt Bress/Extender: Melburn Hake, HOYT Weeks in Treatment: 9 Active Problems Location of Pain Severity and Description of Pain Patient Has Paino No Site Locations Pain Management and Medication Current Pain Management: Electronic Signature(s) Signed: 11/09/2018 4:54:37 PM By: Montey Hora Entered By: Montey Hora on 11/09/2018 13:51:26 [name], [name] ([id number]) -------------------------------------------------------------------------------- Patient/Caregiver Education Details Patient Name: [name]. Date of Service: 11/09/2018 1:30 PM Medical Record Number: [id number] Patient Account Number: [id number] Date of Birth/Gender: 16-Mar-1966 (53 y.o. M) Treating RN: Army Melia Primary Care Physician: [name] Other Clinician: Referring Physician: [name] Treating Physician/Extender: Sharalyn Ink in Treatment: 9 Education Assessment Education Provided To: Patient Education Topics Provided Wound/Skin Impairment: Handouts: Caring for Your Ulcer Methods: Demonstration, Explain/Verbal Responses: State content correctly Electronic Signature(s) Signed: 11/10/2018 3:17:07 PM By: Army Melia Entered By: Army Melia on 11/09/2018 14:06:58 [name], [name] ([id number]) -------------------------------------------------------------------------------- Wound Assessment Details Patient Name: [name]. Date of Service: 11/09/2018 1:30 PM Medical Record Number: [id number] Patient Account Number: [id number] Date of Birth/Sex: 1966/08/18 (53 y.o. M) Treating RN: Montey Hora Primary Care Roshard Rezabek: Roslynn Amble  SHANNON Other Clinician: Referring Aengus Sauceda: [name] Treating Lailie Smead/Extender: STONE III, HOYT Weeks in Treatment: 9 Wound Status Wound Number: 1 Primary Etiology: Diabetic Wound/Ulcer of the Lower Extremity Wound Location: Right Lower Leg - Lateral Wound Status:  Open Wounding Event: Gradually Appeared Comorbid Hypertension, Type II Diabetes, Gout Date Acquired: 06/06/2018 History: Weeks Of Treatment: 9 Clustered Wound: No Photos Photo Uploaded By: Montey Hora on 11/09/2018 16:52:58 Wound Measurements Length: (cm) 0.1 Width: (cm) 0.1 Depth: (cm) 0.1 Area: (cm) 0.008 Volume: (cm) 0.001 % Reduction in Area: 99.8% % Reduction in Volume: 99.7% Epithelialization: Large (67-100%) Undermining: No Wound Description Classification: Grade 2 Wound Margin: Distinct, outline attached Exudate Amount: None Present Foul Odor After Cleansing: No Slough/Fibrino Yes Wound Bed Granulation Amount: Large (67-100%) Exposed Structure Necrotic Amount: None Present (0%) Fascia Exposed: No Fat Layer (Subcutaneous Tissue) Exposed: Yes Tendon Exposed: No Muscle Exposed: No Joint Exposed: No Bone Exposed: No Periwound Skin Texture Texture Color No Abnormalities Noted: No No Abnormalities Noted: No [name], [name] ([id number]) Callus: No Atrophie Blanche: No Crepitus: No Cyanosis: No Excoriation: No Ecchymosis: No Induration: No Erythema: No Rash: No Hemosiderin Staining: Yes Scarring: No Mottled: No Pallor: No Moisture Rubor: No No Abnormalities Noted: No Dry / Scaly: Yes Temperature / Pain Maceration: No Tenderness on Palpation: Yes Wound Preparation Ulcer Cleansing: Rinsed/Irrigated with Saline Topical Anesthetic Applied: None Treatment Notes Wound #1 (Right, Lateral Lower Leg) Notes prisma, BFD Electronic Signature(s) Signed: 11/09/2018 4:54:37 PM By: Montey Hora Entered By: Montey Hora on 11/09/2018 13:56:43 [name], [name] ([id number]) -------------------------------------------------------------------------------- Vitals Details Patient Name: [name]. Date of Service: 11/09/2018 1:30 PM Medical Record Number: [id number] Patient Account Number: [id number] Date of Birth/Sex: [birth year] (53 y.o. M) Treating RN: Montey Hora Primary Care Malon Branton: [name] Other Clinician: Referring Dorlene Footman: [name] Treating Nadir Vasques/Extender: STONE III, HOYT Weeks in Treatment: 9 Vital Signs Time Taken: 13:51 Temperature (F): 97.6 Height (in): 68 Pulse (bpm): 76 Weight (lbs): 389 Respiratory Rate (breaths/min): 16 Body Mass Index (BMI): 59.1 Blood Pressure (mmHg): 137/72 Reference Range: 80 - 120 mg / dl Electronic Signature(s) Signed: 11/09/2018 4:54:37 PM By: Montey Hora Entered By: Montey Hora on 11/09/2018 13:52:18

## 2018-11-13 NOTE — Progress Notes (Signed)
QUINTEZ, MASELLI ([id number]) Visit Report for 11/09/2018 Chief Complaint Document Details Patient Name: [name], [name]. Date of Service: 11/09/2018 1:30 PM Medical Record Number: [id number] Patient Account Number: [id number] Date of Birth/Sex: [birth year] (53 y.o. M) Treating RN: Army Melia Primary Care Provider: [name] Other Clinician: Referring Provider: [name] Treating Provider/Extender: Melburn Hake, Fateh Kindle Weeks in Treatment: 9 Information Obtained from: Patient Chief Complaint Right lower leg ulcer Electronic Signature(s) Signed: 11/12/2018 11:30:14 PM By: Worthy Keeler PA-C Entered By: Worthy Keeler on 11/09/2018 13:55:06 Anfinson, [name] ([id number]) -------------------------------------------------------------------------------- HPI Details Patient Name: [name]. Date of Service: 11/09/2018 1:30 PM Medical Record Number: [id number] Patient Account Number: [id number] Date of Birth/Sex: 03/16/1966 (53 y.o. M) Treating RN: Army Melia Primary Care Provider: [name] Other Clinician: Referring Provider: [name] Treating Provider/Extender: Melburn Hake, Clayson Riling Weeks in Treatment: 9 History of Present Illness HPI Description: 53 year old male with past medical history of diabetes type 2, HTN, obesity, former smoker, and chronic narcotic use. Present today for right lower leg ulcer. He states that he first noticed wound back in October 2019. At that time he felt that he was bitten by something. There was no OTC treatment. Over time the wound became larger with increased pain. He was seen by [facility] urgent [facility] and started on Keflex antibiotics for cellulitis for 14 days. Finished abx 2-3 days ago. Chronic bilateral knee pain. HA1C in November 2019 was 9.6. He is independent of ADLs however, he is unable to reach the right leg wound area. Left ABI in clinic is 0.94, right is non-compressible. 09/11/17 on evaluation today patient has a wound over the right lower  extremity which appears to be extremely painful for him. He states at this time that the pain is a 10 out of 10 currently. Nonetheless there does not appear to be any signs of infection. He still does not have any insurance at this point and therefore a vascular evaluation is not possible he tells me. Obviously I do think that with his right lower extremity be noncompressible we really need to have vascular evaluation order to consider any type of wrap or compressive therapy. 09/19/18 on evaluation today patient appears to be doing a little better in regard to his right lateral lower Trinity ulcer. He has been tolerating the dressing changes without complication. Fortunately there is no sign of infection at this time. He has been tolerating the dressing changes without complication. He does have significant fluid buildup of the right lower extremity but at this point no wraps are being performed as he was noncompressible during initial evaluation. He also has not wanted [place] to proceed with a vascular referral for arterial studies due to cost. He does not currently have insurance. 09/28/18 on evaluation today patient appears to be doing well in regard to his right lateral lower extremity ulcer. He's been tolerating the dressing changes without complication. Fortunately there is good signs of improvement which is excellent. 10/10/18 on evaluation today patient appears to be doing rather well in regard to his right lateral lower extremity ulcer. He has noted signs of improvement which is good news in fact some of the lower wounds of actually completely closed off which is excellent news. Fortunately there is no evidence of infection at this time. 10/16/18 on evaluation today patient's right lower extremity ulcer actually appears to be showing signs of good improvement which is excellent news. He's been tolerating the dressing changes without complication. Fortunately there's no sign  of infection at this  time. He still has discomfort which he states is about the same. 11/09/18 on evaluation today patient actually appears to be doing rather well in regard to his right lower extremity ulcer. He's been tolerating the dressing's without complication. Fortunately there's no signs of infection at this time. Overall I'm very pleased with how things seem to be progressing. Electronic Signature(s) Signed: 11/12/2018 11:30:14 PM By: Worthy Keeler PA-C Entered By: Worthy Keeler on 11/11/2018 06:42:36 Neria, [name] ([id number]) -------------------------------------------------------------------------------- Physical Exam Details Patient Name: [name]. Date of Service: 11/09/2018 1:30 PM Medical Record Number: [id number] Patient Account Number: [id number] Date of Birth/Sex: 1966/02/25 (53 y.o. M) Treating RN: Army Melia Primary Care Provider: [name] Other Clinician: Referring Provider: [name] Treating Provider/Extender: STONE III, Margurite Duffy Weeks in Treatment: 9 Constitutional Obese and well-hydrated in no acute distress. Respiratory normal breathing without difficulty. clear to auscultation bilaterally. Cardiovascular regular rate and rhythm with normal S1, S2. Psychiatric this patient is able to make decisions and demonstrates good insight into disease process. Alert and Oriented x 3. pleasant and cooperative. Notes Patient's wound bed currently shows evidence of excellent epithelialization and granulation. There's just a very small area still remaining open at this point which is excellent news other than this his pain is also improved which is excellent as well. Electronic Signature(s) Signed: 11/12/2018 11:30:14 PM By: Worthy Keeler PA-C Entered By: Worthy Keeler on 11/11/2018 06:43:06 Caravello, [name] ([id number]) -------------------------------------------------------------------------------- Physician Orders Details Patient Name: [name]. Date of Service: 11/09/2018  1:30 PM Medical Record Number: [id number] Patient Account Number: [id number] Date of Birth/Sex: Jan 19, 1966 (53 y.o. M) Treating RN: Army Melia Primary Care Provider: [name] Other Clinician: Referring Provider: [name] Treating Provider/Extender: Melburn Hake, Andreia Gandolfi Weeks in Treatment: 9 Verbal / Phone Orders: No Diagnosis Coding ICD-10 Coding Code Description E11.622 Type 2 diabetes mellitus with other skin ulcer L97.812 Non-pressure chronic ulcer of other part of right lower leg with fat layer exposed I10 Essential (primary) hypertension E66.01 Morbid (severe) obesity due to excess calories Wound Cleansing Wound #1 Right,Lateral Lower Leg o May Shower, gently pat wound dry prior to applying new dressing. Anesthetic (add to Medication List) Wound #1 Right,Lateral Lower Leg o Topical Lidocaine 4% cream applied to wound bed prior to debridement (In Clinic Only). Primary Wound Dressing Wound #1 Right,Lateral Lower Leg o Silver Collagen Secondary Dressing Wound #1 Right,Lateral Lower Leg o Boardered Foam Dressing Dressing Change Frequency Wound #1 Right,Lateral Lower Leg o Change dressing every other day. Follow-up Appointments Wound #1 Right,Lateral Lower Leg o Return Appointment in 1 week. Edema Control Wound #1 Right,Lateral Lower Leg o Patient to wear own compression stockings Electronic Signature(s) Signed: 11/10/2018 3:17:07 PM By: Army Melia Signed: 11/12/2018 11:30:14 PM By: Worthy Keeler PA-C Entered By: Army Melia on 11/09/2018 14:06:08 Lonigro, [name] ([id number]) Pixley, [name] ([id number]) -------------------------------------------------------------------------------- Problem List Details Patient Name: [name]. Date of Service: 11/09/2018 1:30 PM Medical Record Number: [id number] Patient Account Number: [id number] Date of Birth/Sex: 1965-10-11 (53 y.o. M) Treating RN: Army Melia Primary Care Provider: [name] Other  Clinician: Referring Provider: [name] Treating Provider/Extender: Melburn Hake, Neymar Dowe Weeks in Treatment: 9 Active Problems ICD-10 Evaluated Encounter Code Description Active Date Today Diagnosis E11.622 Type 2 diabetes mellitus with other skin ulcer 09/04/2018 No Yes L97.812 Non-pressure chronic ulcer of other part of right lower leg 09/04/2018 No Yes with fat layer exposed [name] (primary) hypertension 09/04/2018 No Yes E66.01  Morbid (severe) obesity due to excess calories 09/04/2018 No Yes Inactive Problems Resolved Problems Electronic Signature(s) Signed: 11/12/2018 11:30:14 PM By: Worthy Keeler PA-C Entered By: Worthy Keeler on 11/09/2018 13:55:02 Fluke, [name] ([id number]) -------------------------------------------------------------------------------- Progress Note Details Patient Name: [name]. Date of Service: 11/09/2018 1:30 PM Medical Record Number: [id number] Patient Account Number: [id number] Date of Birth/Sex: 1966/01/04 (53 y.o. M) Treating RN: Army Melia Primary Care Provider: [name] Other Clinician: Referring Provider: [name] Treating Provider/Extender: Melburn Hake, Dempsy Damiano Weeks in Treatment: 9 Subjective Chief Complaint Information obtained from Patient Right lower leg ulcer History of Present Illness (HPI) 53 year old male with past medical history of diabetes type 2, HTN, obesity, former smoker, and chronic narcotic use. Present today for right lower leg ulcer. He states that he first noticed wound back in October 2019. At that time he felt that he was bitten by something. There was no OTC treatment. Over time the wound became larger with increased pain. He was seen by [facility] urgent care and started on Keflex antibiotics for cellulitis for 14 days. Finished abx 2-3 days ago. Chronic bilateral knee pain. HA1C in November 2019 was 9.6. He is independent of ADLs however, he is unable to reach the right leg wound area. Left  ABI in clinic is 0.94, right is non-compressible. 09/11/17 on evaluation today patient has a wound over the right lower extremity which appears to be extremely painful for him. He states at this time that the pain is a 10 out of 10 currently. Nonetheless there does not appear to be any signs of infection. He still does not have any insurance at this point and therefore a vascular evaluation is not possible he tells me. Obviously I do think that with his right lower extremity be noncompressible we really need to have vascular evaluation order to consider any type of wrap or compressive therapy. 09/19/18 on evaluation today patient appears to be doing a little better in regard to his right lateral lower Trinity ulcer. He has been tolerating the dressing changes without complication. Fortunately there is no sign of infection at this time. He has been tolerating the dressing changes without complication. He does have significant fluid buildup of the right lower extremity but at this point no wraps are being performed as he was noncompressible during initial evaluation. He also has not wanted [place] to proceed with a vascular referral for arterial studies due to cost. He does not currently have insurance. 09/28/18 on evaluation today patient appears to be doing well in regard to his right lateral lower extremity ulcer. He's been tolerating the dressing changes without complication. Fortunately there is good signs of improvement which is excellent. 10/10/18 on evaluation today patient appears to be doing rather well in regard to his right lateral lower extremity ulcer. He has noted signs of improvement which is good news in fact some of the lower wounds of actually completely closed off which is excellent news. Fortunately there is no evidence of infection at this time. 10/16/18 on evaluation today patient's right lower extremity ulcer actually appears to be showing signs of good improvement which is excellent news.  He's been tolerating the dressing changes without complication. Fortunately there's no sign of infection at this time. He still has discomfort which he states is about the same. 11/09/18 on evaluation today patient actually appears to be doing rather well in regard to his right lower extremity ulcer. He's been tolerating the dressing's without complication. Fortunately there's no  signs of infection at this time. Overall I'm very pleased with how things seem to be progressing. Patient History Information obtained from Patient. Social History Former smoker - started on 09/15/2002 - ended on 09/06/2012, Marital Status - Single, Alcohol Use - Never, Drug Use - No History, Caffeine Use - Daily - sodas. [name], [name] ([id number]) Medical History Cardiovascular Patient has history of Hypertension Endocrine Patient has history of Type II Diabetes - DX age 6A1C 9.6 Genitourinary Denies history of End Stage Renal Disease Musculoskeletal Patient has history of Gout Oncologic Denies history of Received Chemotherapy, Received Radiation Medical And Surgical History Notes Musculoskeletal Arthritis Review of Systems (ROS) Constitutional Symptoms (General Health) Denies complaints or symptoms of Fever, Chills. Respiratory The patient has no complaints or symptoms. Cardiovascular The patient has no complaints or symptoms. Psychiatric The patient has no complaints or symptoms. Objective Constitutional Obese and well-hydrated in no acute distress. Vitals Time Taken: 1:51 PM, Height: 68 in, Weight: 389 lbs, BMI: 59.1, Temperature: 97.6 F, Pulse: 76 bpm, Respiratory Rate: 16 breaths/min, Blood Pressure: 137/72 mmHg. Respiratory normal breathing without difficulty. clear to auscultation bilaterally. Cardiovascular regular rate and rhythm with normal S1, S2. Psychiatric this patient is able to make decisions and demonstrates good insight into disease process. Alert and Oriented x 3. pleasant and  cooperative. General Notes: Patient's wound bed currently shows evidence of excellent epithelialization and granulation. There's just a very small area still remaining open at this point which is excellent news other than this his pain is also improved which is excellent as well. [name], [name] ([id number]) Integumentary (Hair, Skin) Wound #1 status is Open. Original cause of wound was Gradually Appeared. The wound is located on the Right,Lateral Lower Leg. The wound measures 0.1cm length x 0.1cm width x 0.1cm depth; 0.008cm^2 area and 0.001cm^3 volume. There is Fat Layer (Subcutaneous Tissue) Exposed exposed. There is no undermining noted. There is a none present amount of drainage noted. The wound margin is distinct with the outline attached to the wound base. There is large (67-100%) granulation within the wound bed. There is no necrotic tissue within the wound bed. The periwound skin appearance exhibited: Dry/Scaly, Hemosiderin Staining. The periwound skin appearance did not exhibit: Callus, Crepitus, Excoriation, Induration, Rash, Scarring, Maceration, Atrophie Blanche, Cyanosis, Ecchymosis, Mottled, Pallor, Rubor, Erythema. The periwound has tenderness on palpation. Assessment Active Problems ICD-10 Type 2 diabetes mellitus with other skin ulcer Non-pressure chronic ulcer of other part of right lower leg with fat layer exposed Essential (primary) hypertension Morbid (severe) obesity due to excess calories Plan Wound Cleansing: Wound #1 Right,Lateral Lower Leg: May Shower, gently pat wound dry prior to applying new dressing. Anesthetic (add to Medication List): Wound #1 Right,Lateral Lower Leg: Topical Lidocaine 4% cream applied to wound bed prior to debridement (In Clinic Only). Primary Wound Dressing: Wound #1 Right,Lateral Lower Leg: Silver Collagen Secondary Dressing: Wound #1 Right,Lateral Lower Leg: Boardered Foam Dressing Dressing Change Frequency: Wound #1 Right,Lateral  Lower Leg: Change dressing every other day. Follow-up Appointments: Wound #1 Right,Lateral Lower Leg: Return Appointment in 1 week. Edema Control: Wound #1 Right,Lateral Lower Leg: Patient to wear own compression stockings My suggestion currently is gonna be that we go ahead and continue with the above wound care measures patients in agreement that plan. We will subsequently see him back for reevaluation in one weeks time to see were things stand. If anything changes worsens meantime he will contact the office and let me know. [name], [name] ([id number]) Please see above for  specific wound care orders. We will see patient for re-evaluation in 1 week(s) here in the clinic. If anything worsens or changes patient will contact our office for additional recommendations. Electronic Signature(s) Signed: 11/12/2018 11:30:14 PM By: Worthy Keeler PA-C Entered By: Worthy Keeler on 11/11/2018 06:43:25 [name], [name] ([id number]) -------------------------------------------------------------------------------- ROS/PFSH Details Patient Name: [name]. Date of Service: 11/09/2018 1:30 PM Medical Record Number: [id number] Patient Account Number: [id number] Date of Birth/Sex: 1966/08/11 (53 y.o. M) Treating RN: Army Melia Primary Care Provider: [name] Other Clinician: Referring Provider: [name] Treating Provider/Extender: Melburn Hake, Fatmata Legere Weeks in Treatment: 9 Information Obtained From Patient Wound History Do you currently have one or more open woundso Yes How many open wounds do you currently haveo 1 Approximately how long have you had your woundso 06/06/2018 How have you been treating your wound(s) until nowo antibiotic(Keflex), soap and water Has your wound(s) ever healed and then re-openedo No Have you had any lab work done in the past montho No Have you tested positive for an antibiotic resistant organism (MRSA, VRE)o No Have you tested positive for osteomyelitis (bone  infection)o No Have you had any tests for circulation on your legso No Have you had other problems associated with your woundso Swelling Constitutional Symptoms (General Health) Complaints and Symptoms: Negative for: Fever; Chills Respiratory Complaints and Symptoms: No Complaints or Symptoms Cardiovascular Complaints and Symptoms: No Complaints or Symptoms Medical History: Positive for: Hypertension Endocrine Medical History: Positive for: Type II Diabetes - DX age 1A1C 9.6 Time with diabetes: 3 years Treated with: Insulin, Oral agents Blood sugar tested every day: Yes Tested : Genitourinary Medical History: Negative for: End Stage Renal Disease Musculoskeletal [name], [name] ([id number]) Medical History: Positive for: Gout Past Medical History Notes: Arthritis Oncologic Medical History: Negative for: Received Chemotherapy; Received Radiation Psychiatric Complaints and Symptoms: No Complaints or Symptoms Immunizations Pneumococcal Vaccine: Received Pneumococcal Vaccination: No Implantable Devices No devices added Family and Social History Former smoker - started on 09/15/2002 - ended on 09/06/2012; Marital Status - Single; Alcohol Use: Never; Drug Use: No History; Caffeine Use: Daily - sodas; Financial Concerns: No; Food, Clothing or Shelter Needs: No; Support System Lacking: No; Transportation Concerns: No; Advanced Directives: No; Patient does not want information on Advanced Directives; Living Will: No; Medical Power of Attorney: No Physician Affirmation I have reviewed and agree with the above information. Electronic Signature(s) Signed: 11/12/2018 11:30:14 PM By: Worthy Keeler PA-C Signed: 11/13/2018 8:21:26 AM By: Army Melia Entered By: Worthy Keeler on 11/11/2018 06:42:52 [name], [name] ([id number]) -------------------------------------------------------------------------------- SuperBill Details Patient Name: [name]. Date of Service:  11/09/2018 Medical Record Number: [id number] Patient Account Number: [id number] Date of Birth/Sex: [birth year] (53 y.o. M) Treating RN: Army Melia Primary Care Provider: [name] Other Clinician: Referring Provider: [name] Treating Provider/Extender: Melburn Hake, Jerita Wimbush Weeks in Treatment: 9 Diagnosis Coding ICD-10 Codes Code Description E11.622 Type 2 diabetes mellitus with other skin ulcer L97.812 Non-pressure chronic ulcer of other part of right lower leg with fat layer exposed I10 Essential (primary) hypertension E66.01 Morbid (severe) obesity due to excess calories Facility Procedures CPT4 Code: [id number] Description: 60630 - WOUND CARE VISIT-LEV 2 EST PT Modifier: Quantity: 1 Physician Procedures CPT4 Code Description: [id number] - WC PHYS LEVEL 4 - EST PT ICD-10 Diagnosis Description E11.622 Type 2 diabetes mellitus with other skin ulcer L97.812 Non-pressure chronic ulcer of other part of right lower leg wi I10 Essential (primary)  hypertension E66.01 Morbid (severe) obesity due to  excess calories Modifier: th fat layer expo Quantity: 1 sed Electronic Signature(s) Signed: 11/12/2018 11:30:14 PM By: Worthy Keeler PA-C Entered By: Worthy Keeler on 11/11/2018 06:43:36

## 2018-11-15 ENCOUNTER — Ambulatory Visit: Payer: Self-pay | Admitting: Physician Assistant

## 2018-11-16 ENCOUNTER — Ambulatory Visit: Payer: Self-pay | Admitting: Physician Assistant

## 2018-11-23 ENCOUNTER — Ambulatory Visit: Payer: Self-pay | Admitting: Physician Assistant

## 2018-12-01 ENCOUNTER — Ambulatory Visit: Payer: Self-pay | Admitting: Physician Assistant

## 2018-12-05 ENCOUNTER — Telehealth: Payer: Self-pay | Admitting: Orthopaedic Surgery

## 2018-12-05 MED ORDER — OXYCODONE-ACETAMINOPHEN 10-325 MG PO TABS: 1.0000 | ORAL_TABLET | ORAL | 0 refills | Status: DC | PRN

## 2018-12-05 NOTE — Telephone Encounter (Signed)
Patient requests refill on Oxycodone/Acetaminohen 10-325  Mgs.   Qty 170  Sig: Take 1 tablet by mouth every 4 (four) hours as needed for pain.  Patient states he uses [employer]

## 2018-12-19 ENCOUNTER — Ambulatory Visit: Payer: Self-pay | Admitting: Physician Assistant

## 2018-12-26 ENCOUNTER — Ambulatory Visit: Payer: Self-pay | Admitting: Physician Assistant

## 2018-12-26 ENCOUNTER — Encounter: Payer: Self-pay | Admitting: Physician Assistant

## 2018-12-26 DIAGNOSIS — L97909 Non-pressure chronic ulcer of unspecified part of unspecified lower leg with unspecified severity: Secondary | ICD-10-CM

## 2018-12-26 DIAGNOSIS — E11622 Type 2 diabetes mellitus with other skin ulcer: Secondary | ICD-10-CM

## 2018-12-26 DIAGNOSIS — E1165 Type 2 diabetes mellitus with hyperglycemia: Secondary | ICD-10-CM

## 2018-12-26 DIAGNOSIS — I10 Essential (primary) hypertension: Secondary | ICD-10-CM

## 2018-12-26 DIAGNOSIS — E785 Hyperlipidemia, unspecified: Secondary | ICD-10-CM

## 2018-12-26 NOTE — Progress Notes (Signed)
There were no vitals taken for this visit.   Subjective:    Patient ID: [name], male    DOB: February 10, 1966, 53 y.o.   MRN: [id number]  HPI: [name] is a 53 y.o. male presenting on 12/26/2018 for No chief complaint on file.   HPI   This is a telemedicine visit via telephone (pt doesn't have a smartphone) due to coronavirus pandemic.  I connected with  [name] on 12/26/18 by a video enabled telemedicine application and verified that I am speaking with the correct person using two identifiers.   I discussed the limitations of evaluation and management by telemedicine. The patient expressed understanding and agreed to proceed.    Pt says the person/provider at wound center told him not to take the lasix.  Pt says the swelling in his legs is a lot better now.   He says he hasn't been to the wound center lately due to the CV19.   Pt says his wound is getting a lot smaller and looks a lot better.  He says fluid in his legs is a lot less.  Pt is stressed.  He says the CV19 is just    He is walking and doing things around the house.  He is following guidelines.    He denies SI, HI.    He is checking bs - 144 this morning.  Lowest is 115.  Highest 255 (that was in Feb).  Is doing better now.   Pt has no new complaints except the stress as discussed above  Medical, surgical, family and social history reviewed and updated as indicated. Interim medical history since our last visit reviewed. Allergies and medications reviewed and updated.  Current Outpatient Medications:  .  insulin glargine (LANTUS) 100 UNIT/ML injection, Inject 24 Units into the skin at bedtime. , Disp: , Rfl:  .  lisinopril (PRINIVIL,ZESTRIL) 20 MG tablet, Take 1 tablet (20 mg total) by mouth daily., Disp: 90 tablet, Rfl: 1 .  metFORMIN (GLUCOPHAGE) 1000 MG tablet, Take 1 tablet (1,000 mg total) by mouth 2 (two) times daily with a meal., Disp: 180 tablet, Rfl: 1 .  metoprolol tartrate (LOPRESSOR) 50 MG tablet, Take  1 tablet (50 mg total) by mouth 2 (two) times daily., Disp: 180 tablet, Rfl: 1 .  oxyCODONE-acetaminophen (PERCOCET) 10-325 MG tablet, Take 1 tablet by mouth every 4 (four) hours as needed for pain., Disp: 170 tablet, Rfl: 0 .  sitaGLIPtin (JANUVIA) 100 MG tablet, Take 1 tablet (100 mg total) by mouth daily., Disp: 90 tablet, Rfl: 0 .  furosemide (LASIX) 20 MG tablet, Take 1 tablet (20 mg total) by mouth daily. (Patient not taking: Reported on 12/26/2018), Disp: 90 tablet, Rfl: 0     Review of Systems  Per HPI unless specifically indicated above     Objective:    There were no vitals taken for this visit.  Wt Readings from Last 3 Encounters:  11/08/18 (!) 413 lb (187.3 kg)  10/17/18 (!) 413 lb 8 oz (187.6 kg)  09/14/18 (!) 404 lb 8 oz (183.5 kg)    Physical Exam Pulmonary:     Effort: Pulmonary effort is normal. No respiratory distress.     Comments: Pt is speaking in complete sentences without becoming dyspnic Neurological:     Mental Status: He is alert and oriented to person, place, and time.         Assessment & Plan:    Encounter Diagnoses  Name Primary?  [name]  Uncontrolled type 2 diabetes mellitus with hyperglycemia ([place]) Yes  . Hyperlipidemia, unspecified hyperlipidemia type   . Essential hypertension   . Morbid obesity ([place])   . Diabetic ulcer of lower leg ([place])     -pt is counseled to Be active.  This will help his stress levels as well as his medical conditions -pt is to continue to Monitor bs.  He is reminded to call office for fbs < 70 or > 300 -counseled pt to avoid running out of his meds -pt will follow up early June with plans to update labs at that time.  Pt to contact office sooner for any problems

## 2019-01-02 ENCOUNTER — Telehealth: Payer: Self-pay | Admitting: Orthopaedic Surgery

## 2019-01-02 MED ORDER — OXYCODONE-ACETAMINOPHEN 10-325 MG PO TABS: 1.0000 | ORAL_TABLET | ORAL | 0 refills | Status: DC | PRN

## 2019-01-02 NOTE — Telephone Encounter (Signed)
Oxycodone-Acetaminophen 10-325 MG  Qty  170 Tablets  PATIENT USES [facility]

## 2019-01-11 ENCOUNTER — Ambulatory Visit: Payer: Self-pay | Admitting: Physician Assistant

## 2019-01-12 ENCOUNTER — Encounter: Payer: Self-pay | Attending: Physician Assistant | Admitting: Physician Assistant

## 2019-01-12 ENCOUNTER — Other Ambulatory Visit: Payer: Self-pay

## 2019-01-12 DIAGNOSIS — L97812 Non-pressure chronic ulcer of other part of right lower leg with fat layer exposed: Secondary | ICD-10-CM | POA: Insufficient documentation

## 2019-01-12 DIAGNOSIS — Z79891 Long term (current) use of opiate analgesic: Secondary | ICD-10-CM | POA: Insufficient documentation

## 2019-01-12 DIAGNOSIS — M1990 Unspecified osteoarthritis, unspecified site: Secondary | ICD-10-CM | POA: Insufficient documentation

## 2019-01-12 DIAGNOSIS — I10 Essential (primary) hypertension: Secondary | ICD-10-CM | POA: Insufficient documentation

## 2019-01-12 DIAGNOSIS — Z6841 Body Mass Index (BMI) 40.0 and over, adult: Secondary | ICD-10-CM | POA: Insufficient documentation

## 2019-01-12 DIAGNOSIS — E11622 Type 2 diabetes mellitus with other skin ulcer: Secondary | ICD-10-CM | POA: Insufficient documentation

## 2019-01-12 DIAGNOSIS — M109 Gout, unspecified: Secondary | ICD-10-CM | POA: Insufficient documentation

## 2019-01-12 DIAGNOSIS — Z87891 Personal history of nicotine dependence: Secondary | ICD-10-CM | POA: Insufficient documentation

## 2019-01-12 NOTE — Progress Notes (Signed)
YARED, BAREFOOT ([id number]) Visit Report for 01/12/2019 Chief Complaint Document Details Patient Name: [name], [name]. Date of Service: 01/12/2019 12:30 PM Medical Record Number: [id number] Patient Account Number: [id number] Date of Birth/Sex: April 23, 1966 (53 y.o. M) Treating RN: Montey Hora Primary Care Provider: [name] Other Clinician: Referring Provider: [name] Treating Provider/Extender: Melburn Hake, HOYT Weeks in Treatment: 18 Information Obtained from: Patient Chief Complaint Right lower leg ulcer Electronic Signature(s) Signed: 01/12/2019 4:21:06 PM By: Worthy Keeler PA-C Entered By: Worthy Keeler on 01/12/2019 12:50:33 Cardosa, [name] ([id number]) -------------------------------------------------------------------------------- HPI Details Patient Name: [name]. Date of Service: 01/12/2019 12:30 PM Medical Record Number: [id number] Patient Account Number: [id number] Date of Birth/Sex: Jul 12, 1966 (53 y.o. M) Treating RN: Montey Hora Primary Care Provider: [name] Other Clinician: Referring Provider: [name] Treating Provider/Extender: Melburn Hake, HOYT Weeks in Treatment: 18 History of Present Illness HPI Description: 53 year old male with past medical history of diabetes type 2, HTN, obesity, former smoker, and chronic narcotic use. Present today for right lower leg ulcer. He states that he first noticed wound back in October 2019. At that time he felt that he was bitten by something. There was no OTC treatment. Over time the wound became larger with increased pain. He was seen by [facility] urgent care and started on Keflex antibiotics for cellulitis for 14 days. Finished abx 2-3 days ago. Chronic bilateral knee pain. HA1C in November 2019 was 9.6. He is independent of ADLs however, he is unable to reach the right leg wound area. Left ABI in clinic is 0.94, right is non-compressible. 09/11/17 on evaluation today patient has a wound over the right  lower extremity which appears to be extremely painful for him. He states at this time that the pain is a 10 out of 10 currently. Nonetheless there does not appear to be any signs of infection. He still does not have any insurance at this point and therefore a vascular evaluation is not possible he tells me. Obviously I do think that with his right lower extremity be noncompressible we really need to have vascular evaluation order to consider any type of wrap or compressive therapy. 09/19/18 on evaluation today patient appears to be doing a little better in regard to his right lateral lower Trinity ulcer. He has been tolerating the dressing changes without complication. Fortunately there is no sign of infection at this time. He has been tolerating the dressing changes without complication. He does have significant fluid buildup of the right lower extremity but at this point no wraps are being performed as he was noncompressible during initial evaluation. He also has not wanted [place] to proceed with a vascular referral for arterial studies due to cost. He does not currently have insurance. 09/28/18 on evaluation today patient appears to be doing well in regard to his right lateral lower extremity ulcer. He's been tolerating the dressing changes without complication. Fortunately there is good signs of improvement which is excellent. 10/10/18 on evaluation today patient appears to be doing rather well in regard to his right lateral lower extremity ulcer. He has noted signs of improvement which is good news in fact some of the lower wounds of actually completely closed off which is excellent news. Fortunately there is no evidence of infection at this time. 10/16/18 on evaluation today patient's right lower extremity ulcer actually appears to be showing signs of good improvement which is excellent news. He's been tolerating the dressing changes without complication. Fortunately there's no sign  of infection at  this time. He still has discomfort which he states is about the same. 11/09/18 on evaluation today patient actually appears to be doing rather well in regard to his right lower extremity ulcer. He's been tolerating the dressing's without complication. Fortunately there's no signs of infection at this time. Overall I'm very pleased with how things seem to be progressing. 01/12/19 on evaluation today patient appears to be doing very well in regard to his right lateral lower extremity ulcers. He's been tolerating the dressing changes without complication. Fortunately there's no signs of active infection in fact it appears that he may actually be completely healed. No fevers, chills, nausea, or vomiting noted at this time. Electronic Signature(s) Signed: 01/12/2019 4:21:06 PM By: Worthy Keeler PA-C Entered By: Worthy Keeler on 01/12/2019 13:29:26 Quale, [name] ([id number]) -------------------------------------------------------------------------------- Physical Exam Details Patient Name: [name]. Date of Service: 01/12/2019 12:30 PM Medical Record Number: [id number] Patient Account Number: [id number] Date of Birth/Sex: 1965-11-28 (53 y.o. M) Treating RN: Montey Hora Primary Care Provider: [name] Other Clinician: Referring Provider: [name] Treating Provider/Extender: STONE III, HOYT Weeks in Treatment: 38 Constitutional Well-nourished and well-hydrated in no acute distress. Respiratory normal breathing without difficulty. clear to auscultation bilaterally. Cardiovascular regular rate and rhythm with normal S1, S2. Psychiatric this patient is able to make decisions and demonstrates good insight into disease process. Alert and Oriented x 3. pleasant and cooperative. Notes Patient's wound bed currently again showed signs of some dry skin but nothing appears to be open at this point which is good news. Overall he's been taking good care of this which is excellent. No  fevers, chills, nausea, or vomiting noted at this time. Electronic Signature(s) Signed: 01/12/2019 4:21:06 PM By: Worthy Keeler PA-C Entered By: Worthy Keeler on 01/12/2019 13:29:52 Kynard, [name] ([id number]) -------------------------------------------------------------------------------- Physician Orders Details Patient Name: [name]. Date of Service: 01/12/2019 12:30 PM Medical Record Number: [id number] Patient Account Number: [id number] Date of Birth/Sex: 07-Mar-1966 (53 y.o. M) Treating RN: Montey Hora Primary Care Provider: [name] Other Clinician: Referring Provider: [name] Treating Provider/Extender: Melburn Hake, HOYT Weeks in Treatment: 31 Verbal / Phone Orders: No Diagnosis Coding ICD-10 Coding Code Description E11.622 Type 2 diabetes mellitus with other skin ulcer L97.812 Non-pressure chronic ulcer of other part of right lower leg with fat layer exposed I10 Essential (primary) hypertension E66.01 Morbid (severe) obesity due to excess calories Discharge From [facility] o Discharge from [facility] Signature(s) Signed: 01/12/2019 4:21:06 PM By: Worthy Keeler PA-C Signed: 01/12/2019 4:32:22 PM By: Montey Hora Entered By: Montey Hora on 01/12/2019 13:02:21 Larranaga, [name] ([id number]) -------------------------------------------------------------------------------- Problem List Details Patient Name: [name]. Date of Service: 01/12/2019 12:30 PM Medical Record Number: [id number] Patient Account Number: [id number] Date of Birth/Sex: 1966-04-08 (53 y.o. M) Treating RN: Montey Hora Primary Care Provider: [name] Other Clinician: Referring Provider: [name] Treating Provider/Extender: Melburn Hake, HOYT Weeks in Treatment: 18 Active Problems ICD-10 Evaluated Encounter Code Description Active Date Today Diagnosis E11.622 Type 2 diabetes mellitus with other skin ulcer 09/04/2018 No Yes L97.812 Non-pressure  chronic ulcer of other part of right lower leg 09/04/2018 No Yes with fat layer exposed [place]atio (primary) hypertension 09/04/2018 No Yes E66.01 Morbid (severe) obesity due to excess calories 09/04/2018 No Yes Inactive Problems Resolved Problems Electronic Signature(s) Signed: 01/12/2019 4:21:06 PM By: Worthy Keeler PA-C Entered By: Worthy Keeler on 01/12/2019 12:50:28 Sui, [name] ([id number]) -------------------------------------------------------------------------------- Progress Note  Details Patient Name: [name], [name]. Date of Service: 01/12/2019 12:30 PM Medical Record Number: [id number] Patient Account Number: [id number] Date of Birth/Sex: 08-Jul-1966 (53 y.o. M) Treating RN: Montey Hora Primary Care Provider: [name] Other Clinician: Referring Provider: [name] Treating Provider/Extender: Melburn Hake, HOYT Weeks in Treatment: 18 Subjective Chief Complaint Information obtained from Patient Right lower leg ulcer History of Present Illness (HPI) 53 year old male with past medical history of diabetes type 2, HTN, obesity, former smoker, and chronic narcotic use. Present today for right lower leg ulcer. He states that he first noticed wound back in October 2019. At that time he felt that he was bitten by something. There was no OTC treatment. Over time the wound became larger with increased pain. He was seen by [facility] urgent care and started on Keflex antibiotics for cellulitis for 14 days. Finished abx 2-3 days ago. Chronic bilateral knee pain. HA1C in November 2019 was 9.6. He is independent of ADLs however, he is unable to reach the right leg wound area. Left ABI in clinic is 0.94, right is non-compressible. 09/11/17 on evaluation today patient has a wound over the right lower extremity which appears to be extremely painful for him. He states at this time that the pain is a 10 out of 10 currently. Nonetheless there does not appear to be any signs of  infection. He still does not have any insurance at this point and therefore a vascular evaluation is not possible he tells me. Obviously I do think that with his right lower extremity be noncompressible we really need to have vascular evaluation order to consider any type of wrap or compressive therapy. 09/19/18 on evaluation today patient appears to be doing a little better in regard to his right lateral lower Trinity ulcer. He has been tolerating the dressing changes without complication. Fortunately there is no sign of infection at this time. He has been tolerating the dressing changes without complication. He does have significant fluid buildup of the right lower extremity but at this point no wraps are being performed as he was noncompressible during initial evaluation. He also has not wanted [place] to proceed with a vascular referral for arterial studies due to cost. He does not currently have insurance. 09/28/18 on evaluation today patient appears to be doing well in regard to his right lateral lower extremity ulcer. He's been tolerating the dressing changes without complication. Fortunately there is good signs of improvement which is excellent. 10/10/18 on evaluation today patient appears to be doing rather well in regard to his right lateral lower extremity ulcer. He has noted signs of improvement which is good news in fact some of the lower wounds of actually completely closed off which is excellent news. Fortunately there is no evidence of infection at this time. 10/16/18 on evaluation today patient's right lower extremity ulcer actually appears to be showing signs of good improvement which is excellent news. He's been tolerating the dressing changes without complication. Fortunately there's no sign of infection at this time. He still has discomfort which he states is about the same. 11/09/18 on evaluation today patient actually appears to be doing rather well in regard to his right lower extremity  ulcer. He's been tolerating the dressing's without complication. Fortunately there's no signs of infection at this time. Overall I'm very pleased with how things seem to be progressing. 01/12/19 on evaluation today patient appears to be doing very well in regard to his right lateral lower extremity ulcers. He's been tolerating  the dressing changes without complication. Fortunately there's no signs of active infection in fact it appears that he may actually be completely healed. No fevers, chills, nausea, or vomiting noted at this time. Patient History Information obtained from Patient. [name], [name] ([id number]) Social History Former smoker - started on 09/15/2002 - ended on 09/06/2012, Marital Status - Single, Alcohol Use - Never, Drug Use - No History, Caffeine Use - Daily - sodas. Medical History Cardiovascular Patient has history of Hypertension Endocrine Patient has history of Type II Diabetes - DX age 19A1C 9.6 Genitourinary Denies history of End Stage Renal Disease Musculoskeletal Patient has history of Gout Oncologic Denies history of Received Chemotherapy, Received Radiation Medical And Surgical History Notes Musculoskeletal Arthritis Review of Systems (ROS) Constitutional Symptoms (General Health) Denies complaints or symptoms of Fatigue, Fever, Chills, Marked Weight Change. Respiratory Denies complaints or symptoms of Chronic or frequent coughs, Shortness of Breath. Cardiovascular Denies complaints or symptoms of Chest pain, LE edema. Psychiatric Denies complaints or symptoms of Anxiety, Claustrophobia. Objective Constitutional Well-nourished and well-hydrated in no acute distress. Vitals Time Taken: 12:45 PM, Height: 68 in, Weight: 389 lbs, BMI: 59.1, Temperature: 98.4 F, Pulse: 80 bpm, Respiratory Rate: 16 breaths/min, Blood Pressure: 143/73 mmHg. Respiratory normal breathing without difficulty. clear to auscultation bilaterally. Cardiovascular regular rate and  rhythm with normal S1, S2. Psychiatric this patient is able to make decisions and demonstrates good insight into disease process. Alert and Oriented x 3. pleasant and cooperative. [name], [name] ([id number]) General Notes: Patient's wound bed currently again showed signs of some dry skin but nothing appears to be open at this point which is good news. Overall he's been taking good care of this which is excellent. No fevers, chills, nausea, or vomiting noted at this time. Integumentary (Hair, Skin) Wound #1 status is Healed - Epithelialized. Original cause of wound was Gradually Appeared. The wound is located on the Right,Lateral Lower Leg. The wound measures 0cm length x 0cm width x 0cm depth; 0cm^2 area and 0cm^3 volume. The wound is limited to skin breakdown. There is no tunneling or undermining noted. There is a none present amount of drainage noted. The wound margin is distinct with the outline attached to the wound base. There is no granulation within the wound bed. There is a large (67-100%) amount of necrotic tissue within the wound bed including Eschar. The periwound skin appearance exhibited: Hemosiderin Staining. The periwound skin appearance did not exhibit: Callus, Crepitus, Excoriation, Induration, Rash, Scarring, Dry/Scaly, Maceration, Atrophie Blanche, Cyanosis, Ecchymosis, Mottled, Pallor, Rubor, Erythema. The periwound has tenderness on palpation. Assessment Active Problems ICD-10 Type 2 diabetes mellitus with other skin ulcer Non-pressure chronic ulcer of other part of right lower leg with fat layer exposed Essential (primary) hypertension Morbid (severe) obesity due to excess calories Plan Discharge From [facility] Services: Discharge from [facility] My suggestion at this point is gonna be that we go ahead and continue to have him wear compression stockings at home which he states he can definitely do. I did suggest lotion to the area to be put on at night compression  stockings during the day. Hopefully this will prevent anything from reopening. He's in agreement this plan. If anything changes or worsens meantime he knows to contact the office and let me know we will be happy to see him back in that event. Otherwise will discontinue wound care services at this point. Electronic Signature(s) Signed: 01/12/2019 4:21:06 PM By: Worthy Keeler PA-C Entered By: Worthy Keeler on 01/12/2019  13:30:10 [name], [name] ([id number]) -------------------------------------------------------------------------------- ROS/PFSH Details Patient Name: [name], [name]. Date of Service: 01/12/2019 12:30 PM Medical Record Number: [id number] Patient Account Number: [id number] Date of Birth/Sex: 01-Oct-1965 (53 y.o. M) Treating RN: Montey Hora Primary Care Provider: [name] Other Clinician: Referring Provider: [name] Treating Provider/Extender: STONE III, HOYT Weeks in Treatment: 18 Information Obtained From Patient Constitutional Symptoms (General Health) Complaints and Symptoms: Negative for: Fatigue; Fever; Chills; Marked Weight Change Respiratory Complaints and Symptoms: Negative for: Chronic or frequent coughs; Shortness of Breath Cardiovascular Complaints and Symptoms: Negative for: Chest pain; LE edema Medical History: Positive for: Hypertension Psychiatric Complaints and Symptoms: Negative for: Anxiety; Claustrophobia Endocrine Medical History: Positive for: Type II Diabetes - DX age 2A1C 9.6 Time with diabetes: 3 years Treated with: Insulin, Oral agents Blood sugar tested every day: Yes Tested : Genitourinary Medical History: Negative for: End Stage Renal Disease Musculoskeletal Medical History: Positive for: Gout Past Medical History Notes: Arthritis Oncologic [name], [name] ([id number]) Medical History: Negative for: Received Chemotherapy; Received Radiation Immunizations Pneumococcal Vaccine: Received Pneumococcal Vaccination:  No Implantable Devices No devices added Family and Social History Former smoker - started on 09/15/2002 - ended on 09/06/2012; Marital Status - Single; Alcohol Use: Never; Drug Use: No History; Caffeine Use: Daily - sodas; Financial Concerns: No; Food, Clothing or Shelter Needs: No; Support System Lacking: No; Transportation Concerns: No Physician Affirmation I have reviewed and agree with the above information. Electronic Signature(s) Signed: 01/12/2019 4:21:06 PM By: Worthy Keeler PA-C Signed: 01/12/2019 4:32:22 PM By: Montey Hora Entered By: Worthy Keeler on 01/12/2019 13:29:39 [name], [name] ([id number]) -------------------------------------------------------------------------------- SuperBill Details Patient Name: [name]. Date of Service: 01/12/2019 Medical Record Number: [id number] Patient Account Number: [id number] Date of Birth/Sex: 05/10/1966 (53 y.o. M) Treating RN: Montey Hora Primary Care Provider: [name] Other Clinician: Referring Provider: [name] Treating Provider/Extender: Melburn Hake, HOYT Weeks in Treatment: 18 Diagnosis Coding ICD-10 Codes Code Description E11.622 Type 2 diabetes mellitus with other skin ulcer L97.812 Non-pressure chronic ulcer of other part of right lower leg with fat layer exposed I10 Essential (primary) hypertension E66.01 Morbid (severe) obesity due to excess calories Facility Procedures CPT4 Code: [id number] Description: 99213 - WOUND CARE VISIT-LEV 3 EST PT Modifier: Quantity: 1 Physician Procedures CPT4 Code Description: [id number] - WC PHYS LEVEL 3 - EST PT ICD-10 Diagnosis Description E11.622 Type 2 diabetes mellitus with other skin ulcer L97.812 Non-pressure chronic ulcer of other part of right lower leg wi I10 Essential (primary)  hypertension E66.01 Morbid (severe) obesity due to excess calories Modifier: th fat layer expo Quantity: 1 sed Electronic Signature(s) Signed: 01/12/2019 4:21:06 PM By: Worthy Keeler PA-C Entered By: Worthy Keeler on 01/12/2019 13:31:24

## 2019-01-15 NOTE — Progress Notes (Signed)
JULION, GATT ([id number]) Visit Report for 01/12/2019 Arrival Information Details Patient Name: [name] [name], [name] [name]. Date of Service: 01/12/2019 12:30 PM Medical Record Number: [id number] Patient Account Number: [id number] Date of Birth/Sex: [birth year] (53 y.o. M) Treating RN: [name] [name] Primary Care Lasheba Stevens: [name] Other Clinician: Referring [name] [name]: [name] Treating [name] [name]/Extender: [name] [name], [name] [name]: 18 Visit Information History Since Last Visit Added or deleted any medications: No Patient Arrived: Ambulatory Any new allergies or adverse reactions: No Arrival Time: 12:38 Had a fall or experienced change in No Accompanied By: self activities of daily living that may affect Transfer Assistance: None risk of falls: Patient Identification Verified: Yes Signs or symptoms of abuse/neglect since last visito No Secondary Verification Process Completed: Yes Hospitalized since last visit: No Implantable device outside of the clinic excluding No cellular tissue based products placed in the center since last visit: Pain Present Now: No Electronic Signature(s) Signed: 01/15/2019 5:03:03 PM By: [name] [name], BSN, RN, CWS, Kim RN, BSN Entered By: [name] [name], BSN, RN, CWS, Kim on 01/12/2019 12:39:16 [name], [name] Eva ([id number]) -------------------------------------------------------------------------------- Clinic Level of Care Assessment Details Patient Name: [name] [name]. Date of Service: 01/12/2019 12:30 PM Medical Record Number: [id number] Patient Account Number: [id number] Date of Birth/Sex: 12-27-1965 (53 y.o. M) Treating RN: Montey Hora Primary Care Arvis Miguez: [name] Other Clinician: Referring Shirin Echeverry: [name] Treating Nalanie Winiecki/Extender: [name] [name], [name] [name]: 18 Clinic Level of Care Assessment Items TOOL 4 Quantity Score []  - Use when only an EandM is performed on FOLLOW-UP visit 0 ASSESSMENTS - Nursing Assessment / Reassessment X  - Reassessment of Co-morbidities (includes updates in patient status) 1 10 X- 1 5 Reassessment of Adherence to [name] Plan ASSESSMENTS - Wound and Skin Assessment / Reassessment X - Simple Wound Assessment / Reassessment - one wound 1 5 []  - 0 Complex Wound Assessment / Reassessment - multiple wounds []  - 0 Dermatologic / Skin Assessment (not related to wound area) ASSESSMENTS - Focused Assessment []  - Circumferential Edema Measurements - multi extremities 0 []  - 0 Nutritional Assessment / Counseling / Intervention X- 1 5 Lower Extremity Assessment (monofilament, tuning fork, pulses) []  - 0 Peripheral Arterial Disease Assessment (using hand held doppler) ASSESSMENTS - Ostomy and/or Continence Assessment and Care []  - Incontinence Assessment and Management 0 []  - 0 Ostomy Care Assessment and Management (repouching, etc.) PROCESS - Coordination of Care X - Simple Patient / Family Education for ongoing care 1 15 []  - 0 Complex (extensive) Patient / Family Education for ongoing care X- 1 10 Staff obtains Programmer, systems, Records, Test Results / Process Orders []  - 0 Staff telephones HHA, Nursing Homes / Clarify orders / etc []  - 0 Routine Transfer to another Facility (non-emergent condition) []  - 0 Routine Hospital Admission (non-emergent condition) []  - 0 New Admissions / Biomedical engineer / Ordering NPWT, Apligraf, etc. []  - 0 Emergency Hospital Admission (emergent condition) X- 1 10 Simple Discharge Coordination [name] [name], JENTZ. ([id number]) []  - 0 Complex (extensive) Discharge Coordination PROCESS - Special Needs []  - Pediatric / Minor Patient Management 0 []  - 0 Isolation Patient Management []  - 0 Hearing / Language / Visual special needs []  - 0 Assessment of Community assistance (transportation, D/C planning, etc.) []  - 0 Additional assistance / Altered mentation []  - 0 Support Surface(s) Assessment (bed, cushion, seat, etc.) INTERVENTIONS - Wound Cleansing /  Measurement X - Simple Wound Cleansing - one wound 1 5 []  - 0 Complex Wound Cleansing - multiple wounds X- 1 5 Wound  Imaging (photographs - any number of wounds) []  - 0 Wound Tracing (instead of photographs) X- 1 5 Simple Wound Measurement - one wound []  - 0 Complex Wound Measurement - multiple wounds INTERVENTIONS - Wound Dressings []  - Small Wound Dressing one or multiple wounds 0 []  - 0 Medium Wound Dressing one or multiple wounds []  - 0 Large Wound Dressing one or multiple wounds []  - 0 Application of Medications - topical []  - 0 Application of Medications - injection INTERVENTIONS - Miscellaneous []  - External ear exam 0 []  - 0 Specimen Collection (cultures, biopsies, blood, body fluids, etc.) []  - 0 Specimen(s) / Culture(s) sent or taken to Lab for analysis []  - 0 Patient Transfer (multiple staff / Civil Service fast streamer / Similar devices) []  - 0 Simple Staple / Suture removal (25 or less) []  - 0 Complex Staple / Suture removal (26 or more) []  - 0 Hypo / Hyperglycemic Management (close monitor of Blood Glucose) []  - 0 Ankle / Brachial Index (ABI) - do not check if billed separately X- 1 5 Vital Signs [name], [name] Z. ([id number]) Has the patient been seen at the hospital within the last three years: Yes Total Score: 80 Level Of Care: New/Established - Level 3 Electronic Signature(s) Signed: 01/12/2019 4:32:22 PM By: Montey Hora Entered By: Montey Hora on 01/12/2019 13:09:12 [name], [name] Eva ([id number]) -------------------------------------------------------------------------------- Encounter Discharge Information Details Patient Name: [name] [name]. Date of Service: 01/12/2019 12:30 PM Medical Record Number: [id number] Patient Account Number: [id number] Date of Birth/Sex: [birth year] (53 y.o. M) Treating RN: Montey Hora Primary Care Shiya Fogelman: [name] Other Clinician: Referring Erricka Falkner: [name] Treating Khaleesi Gruel/Extender: [name] [name], [name] Weeks in  [name]: 18 Encounter Discharge Information Items Discharge Condition: Stable Ambulatory Status: Ambulatory Discharge Destination: Home Transportation: Private Auto Accompanied By: self Schedule Follow-up Appointment: No Clinical Summary of Care: Electronic Signature(s) Signed: 01/12/2019 4:32:22 PM By: Montey Hora Entered By: Montey Hora on 01/12/2019 13:09:55 [name], [name] Eva ([id number]) -------------------------------------------------------------------------------- Lower Extremity Assessment Details Patient Name: [name] [name]. Date of Service: 01/12/2019 12:30 PM Medical Record Number: [id number] Patient Account Number: [id number] Date of Birth/Sex: November 20, 1965 (53 y.o. M) Treating RN: [name] [name] Primary Care Laynee Lockamy: [name] Other Clinician: Referring Rotunda Worden: [name] Treating Elynn Patteson/Extender: [name] [name], [name] [name]: 18 Vascular Assessment Pulses: Dorsalis Pedis Palpable: [Right:Yes] Electronic Signature(s) Signed: 01/15/2019 5:03:03 PM By: [name] [name], BSN, RN, CWS, Kim RN, BSN Entered By: [name] [name], BSN, RN, CWS, Kim on 01/12/2019 12:46:36 [name], [name] Eva ([id number]) -------------------------------------------------------------------------------- Multi Wound Chart Details Patient Name: [name] [name]. Date of Service: 01/12/2019 12:30 PM Medical Record Number: [id number] Patient Account Number: [id number] Date of Birth/Sex: [birth year] (53 y.o. M) Treating RN: [name] [name] Primary Care Kenya Kook: [name] Other Clinician: Referring Nevan Creighton: [name] Treating Safira Proffit/Extender: [name] [name], [name] [name]: 18 Vital Signs Height(in): 68 Pulse(bpm): 80 Weight(lbs): 389 Blood Pressure(mmHg): 143/73 Body Mass Index(BMI): 59 Temperature(F): 98.4 Respiratory Rate 16 (breaths/min): Photos: [1:No Photos] [N/A:N/A] Wound Location: [1:Right, Lateral Lower Leg] [N/A:N/A] Wounding Event: [1:Gradually Appeared] [N/A:N/A] Primary  Etiology: [1:Diabetic Wound/Ulcer of the Lower Extremity] [N/A:N/A] Date Acquired: [1:06/06/2018] [N/A:N/A] Weeks of [name]: [1:18] [N/A:N/A] Wound Status: [1:Open] [N/A:N/A] Measurements L x W x D [1:4x2x0.1] [N/A:N/A] (cm) Area (cm) : [1:6.283] [N/A:N/A] Volume (cm) : [1:0.628] [N/A:N/A] % Reduction in Area: [1:-60.00%] [N/A:N/A] % Reduction in Volume: [1:-59.80%] [N/A:N/A] Classification: [1:Grade 2] [N/A:N/A] Periwound Skin Texture: [1:No Abnormalities Noted] [N/A:N/A] Periwound Skin Moisture: [1:No Abnormalities Noted] [N/A:N/A] Periwound Skin Color: [1:No Abnormalities Noted No] [N/A:N/A N/A] [name] Notes Electronic  Signature(s) Signed: 01/15/2019 5:03:03 PM By: [name] [name], BSN, RN, CWS, Kim RN, BSN Entered By: [name] [name], BSN, RN, CWS, Kim on 01/12/2019 12:46:47 [name] [name] ([id number]) -------------------------------------------------------------------------------- [place] Details Patient Name: [name] [name]. Date of Service: 01/12/2019 12:30 PM Medical Record Number: [id number] Patient Account Number: [id number] Date of Birth/Sex: [birth year] (53 y.o. M) Treating RN: [name] [name] Primary Care Arnesia Vincelette: [name] Other Clinician: Referring Gurleen Larrivee: [name] Treating Larell Baney/Extender: [name] [name], [name] [name]: 18 Active Inactive Electronic Signature(s) Signed: 01/12/2019 4:32:22 PM By: Montey Hora Signed: 01/15/2019 5:03:03 PM By: [name] [name], BSN, RN, CWS, Kim RN, BSN Entered By: Montey Hora on 01/12/2019 13:07:08 [name] [name] ([id number]) -------------------------------------------------------------------------------- Pain Assessment Details Patient Name: [name] [name]. Date of Service: 01/12/2019 12:30 PM Medical Record Number: [id number] Patient Account Number: [id number] Date of Birth/Sex: 1966-06-16 (53 y.o. M) Treating RN: [name] [name] Primary Care Yaretzi Ernandez: [name] Other Clinician: Referring Vallen Calabrese: [name] Treating Jassiel Flye/Extender: [name] [name], [name] [name]: 18 Active Problems Location of Pain Severity and Description of Pain Patient Has Paino No Site Locations Pain Management and Medication Current Pain Management: Notes Denies pain at this time. Electronic Signature(s) Signed: 01/15/2019 5:03:03 PM By: [name] [name], BSN, RN, CWS, Kim RN, BSN Entered By: [name] [name], BSN, RN, CWS, Kim on 01/12/2019 12:39:31 [name], [name] Eva ([id number]) -------------------------------------------------------------------------------- Patient/Caregiver Education Details Patient Name: [name] [name]. Date of Service: 01/12/2019 12:30 PM Medical Record Number: [id number] Patient Account Number: [id number] Date of Birth/Gender: 03/25/1966 (53 y.o. M) Treating RN: Montey Hora Primary Care Physician: [name] Other Clinician: Referring Physician: [name] Treating Physician/Extender: Sharalyn Ink in [name]: 101 Education Assessment Education Provided To: Patient Education Topics Provided Basic Hygiene: Handouts: Other: care of newly healed ulcer site Methods: Demonstration, Explain/Verbal Responses: State content correctly Electronic Signature(s) Signed: 01/12/2019 4:32:22 PM By: Montey Hora Entered By: Montey Hora on 01/12/2019 13:09:38 Pote, [name] Eva ([id number]) -------------------------------------------------------------------------------- Wound Assessment Details Patient Name: [name] [name]. Date of Service: 01/12/2019 12:30 PM Medical Record Number: [id number] Patient Account Number: [id number] Date of Birth/Sex: 29-Jul-1966 (53 y.o. M) Treating RN: Montey Hora Primary Care Faria Casella: [name] Other Clinician: Referring Ruairi Stutsman: [name] Treating Carmelina Balducci/Extender: STONE III, [name] [name]: 18 Wound Status Wound Number: 1 Primary Etiology: Diabetic Wound/Ulcer of the Lower Extremity Wound Location: Right, Lateral Lower Leg Wound  Status: Healed - Epithelialized Wounding Event: Gradually Appeared Comorbid Hypertension, Type II Diabetes, Gout Date Acquired: 06/06/2018 History: Weeks Of [name]: 18 Clustered Wound: No Photos Wound Measurements Length: (cm) 0 % Re Width: (cm) 0 % Re Depth: (cm) 0 Epit Area: (cm) 0 Tun Volume: (cm) 0 Und duction in Area: 100% duction in Volume: 100% helialization: None neling: No ermining: No Wound Description Classification: Grade 2 Foul Wound Margin: Distinct, outline attached Slou Exudate Amount: None Present Odor After Cleansing: No gh/Fibrino No Wound Bed Granulation Amount: None Present (0%) Exposed Structure Necrotic Amount: Large (67-100%) Fascia Exposed: No Necrotic Quality: Eschar Fat Layer (Subcutaneous Tissue) Exposed: No Tendon Exposed: No Muscle Exposed: No Joint Exposed: No Bone Exposed: No Limited to Skin Breakdown Periwound Skin Texture Texture Color No Abnormalities Noted: No No Abnormalities Noted: No [name], [name] Z. ([id number]) Callus: No Atrophie Blanche: No Crepitus: No Cyanosis: No Excoriation: No Ecchymosis: No Induration: No Erythema: No Rash: No Hemosiderin Staining: Yes Scarring: No Mottled: No Pallor: No Moisture Rubor: No No Abnormalities Noted: No Dry / Scaly: No Temperature / Pain Maceration: No Tenderness on Palpation: Yes Electronic Signature(s) Signed: 01/12/2019 4:32:22  PM By: Montey Hora Entered By: Montey Hora on 01/12/2019 13:02:06 [name], [name] Eva ([id number]) -------------------------------------------------------------------------------- Vitals Details Patient Name: [name] [name]. Date of Service: 01/12/2019 12:30 PM Medical Record Number: [id number] Patient Account Number: [id number] Date of Birth/Sex: [birth year] (53 y.o. M) Treating RN: [name] [name] Primary Care Teejay Meader: [name] Other Clinician: Referring Tashia Leiterman: [name] Treating Treveon Bourcier/Extender: [name] [name], [name] Weeks in  [name]: 18 Vital Signs Time Taken: 12:45 Temperature (F): 98.4 Height (in): 68 Pulse (bpm): 80 Weight (lbs): 389 Respiratory Rate (breaths/min): 16 Body Mass Index (BMI): 59.1 Blood Pressure (mmHg): 143/73 Reference Range: 80 - 120 mg / dl Electronic Signature(s) Signed: 01/15/2019 5:03:03 PM By: [name] [name], BSN, RN, CWS, Kim RN, BSN Entered By: [name] [name], BSN, RN, CWS, Kim on 01/12/2019 12:42:35

## 2019-01-25 ENCOUNTER — Other Ambulatory Visit: Payer: Self-pay | Admitting: Physician Assistant

## 2019-01-25 DIAGNOSIS — I10 Essential (primary) hypertension: Secondary | ICD-10-CM

## 2019-01-25 DIAGNOSIS — E785 Hyperlipidemia, unspecified: Secondary | ICD-10-CM

## 2019-01-25 DIAGNOSIS — E1165 Type 2 diabetes mellitus with hyperglycemia: Secondary | ICD-10-CM

## 2019-01-30 ENCOUNTER — Telehealth: Payer: Self-pay | Admitting: Orthopaedic Surgery

## 2019-01-30 MED ORDER — OXYCODONE-ACETAMINOPHEN 10-325 MG PO TABS: 1.0000 | ORAL_TABLET | ORAL | 0 refills | Status: DC | PRN

## 2019-01-30 NOTE — Telephone Encounter (Signed)
Oxycodone-Acetaminophen  10/325 mg  Qty 170 Tablets  PATIENT USES [facility]

## 2019-02-07 ENCOUNTER — Other Ambulatory Visit: Payer: Self-pay

## 2019-02-07 ENCOUNTER — Encounter: Payer: Self-pay | Admitting: Orthopaedic Surgery

## 2019-02-07 ENCOUNTER — Ambulatory Visit (INDEPENDENT_AMBULATORY_CARE_PROVIDER_SITE_OTHER): Payer: Self-pay | Admitting: Orthopaedic Surgery

## 2019-02-07 DIAGNOSIS — M25561 Pain in right knee: Secondary | ICD-10-CM

## 2019-02-07 DIAGNOSIS — Z6841 Body Mass Index (BMI) 40.0 and over, adult: Secondary | ICD-10-CM

## 2019-02-07 DIAGNOSIS — M25562 Pain in left knee: Secondary | ICD-10-CM

## 2019-02-07 DIAGNOSIS — G8929 Other chronic pain: Secondary | ICD-10-CM

## 2019-02-07 NOTE — Progress Notes (Signed)
Virtual Visit via Telephone Note  I connected with@ on 02/07/19 at  9:10 AM EDT by telephone and verified that I am speaking with the correct person using two identifiers.  Location: Patient: home Provider: home   I discussed the limitations, risks, security and privacy concerns of performing an evaluation and management service by telephone and the availability of in person appointments. I also discussed with the patient that there may be a patient responsible charge related to this service. The patient expressed understanding and agreed to proceed.   History of Present Illness: He has continued pain of both knees.  He has no new trauma, no giving way.  He has good and bad days.  He is trying to lose weight.  He is taking his medicine.   Observations/Objective: Per above.  Assessment and Plan: Encounter Diagnoses  Name Primary?  . Chronic pain of right knee Yes  . Chronic pain of left knee   . Morbid obesity due to excess calories ([place])   . Body mass index 60.0-69.9, adult (HCC)      Follow Up Instructions: 6 weeks.   I discussed the assessment and treatment plan with the patient. The patient was provided an opportunity to ask questions and all were answered. The patient agreed with the plan and demonstrated an understanding of the instructions.   The patient was advised to call back or seek an in-person evaluation if the symptoms worsen or if the condition fails to improve as anticipated.  I provided 5 minutes of non-face-to-face time during this encounter.   Sanjuana Kava, MD

## 2019-02-15 ENCOUNTER — Ambulatory Visit: Payer: Self-pay | Admitting: Physician Assistant

## 2019-02-15 ENCOUNTER — Telehealth: Payer: Self-pay

## 2019-02-15 ENCOUNTER — Encounter: Payer: Self-pay | Admitting: Physician Assistant

## 2019-02-15 DIAGNOSIS — Z20822 Contact with and (suspected) exposure to covid-19: Secondary | ICD-10-CM

## 2019-02-15 DIAGNOSIS — I10 Essential (primary) hypertension: Secondary | ICD-10-CM

## 2019-02-15 DIAGNOSIS — Z20828 Contact with and (suspected) exposure to other viral communicable diseases: Secondary | ICD-10-CM | POA: Insufficient documentation

## 2019-02-15 DIAGNOSIS — E1165 Type 2 diabetes mellitus with hyperglycemia: Secondary | ICD-10-CM

## 2019-02-15 NOTE — Progress Notes (Signed)
There were no vitals taken for this visit.   Subjective:    Patient ID: [name], male    DOB: 19-Apr-1966, 53 y.o.   MRN: [id number]  HPI: [name] is a 53 y.o. male presenting on 02/15/2019 for No chief complaint on file.   HPI    This is a telemedicine visit due to coronavirus pandemic.  It is via Telephone as pt does not have video capabilities  I connected with  [name] on 02/15/19 by a video enabled telemedicine application and verified that I am speaking with the correct person using two identifiers.   I discussed the limitations of evaluation and management by telemedicine. The patient expressed understanding and agreed to proceed.  Pt is at home.  Provider is at office  Pt didn't get labs drawn because he is concerned he may have covid-19.  He says he was Exposed-  he was around someone who has CV19.  Pt was having symptoms- diarrhea, nausea, congestion.  He says diarrhea and nausea went away and only lasted a day.   He was not wearing a mask at the time of his exposure.   Pt is not running a fever or having SOB  Pt has a lot of anxiety about the virus.    Pt is monitoring his diabetes.  His fbs running 148 this morning.    Relevant past medical, surgical, family and social history reviewed and updated as indicated. Interim medical history since our last visit reviewed. Allergies and medications reviewed and updated.   Current Outpatient Medications:  .  insulin glargine (LANTUS) 100 UNIT/ML injection, Inject 24 Units into the skin at bedtime. , Disp: , Rfl:  .  lisinopril (PRINIVIL,ZESTRIL) 20 MG tablet, Take 1 tablet (20 mg total) by mouth daily., Disp: 90 tablet, Rfl: 1 .  metFORMIN (GLUCOPHAGE) 1000 MG tablet, Take 1 tablet (1,000 mg total) by mouth 2 (two) times daily with a meal., Disp: 180 tablet, Rfl: 1 .  metoprolol tartrate (LOPRESSOR) 50 MG tablet, Take 1 tablet (50 mg total) by mouth 2 (two) times daily., Disp: 180 tablet, Rfl: 1 .   oxyCODONE-acetaminophen (PERCOCET) 10-325 MG tablet, Take 1 tablet by mouth every 4 (four) hours as needed for pain., Disp: 170 tablet, Rfl: 0 .  sitaGLIPtin (JANUVIA) 100 MG tablet, Take 1 tablet (100 mg total) by mouth daily., Disp: 90 tablet, Rfl: 0    Review of Systems  Per HPI unless specifically indicated above     Objective:    There were no vitals taken for this visit.  Wt Readings from Last 3 Encounters:  11/08/18 (!) 413 lb (187.3 kg)  10/17/18 (!) 413 lb 8 oz (187.6 kg)  09/14/18 (!) 404 lb 8 oz (183.5 kg)    Physical Exam Pulmonary:     Effort: Pulmonary effort is normal. No respiratory distress.     Comments: Pt is speaking in complete sentences without dyspnea Neurological:     Mental Status: He is alert and oriented to person, place, and time.  Psychiatric:        Speech: Speech normal.        Behavior: Behavior is cooperative.        Cognition and Memory: Cognition normal.         Assessment & Plan:    Encounter Diagnoses  Name Primary?  . Exposure to Covid-19 Virus   . Uncontrolled type 2 diabetes mellitus with hyperglycemia ([place]) Yes  . Essential hypertension  Pt says he would like to get tested.  Call to scheduling made -pt is told to get his fasting labs drawn only if he gets negative test result -will have follow up visit 2 weeks via telephone.  Pt to contact office sooner prn

## 2019-02-15 NOTE — Telephone Encounter (Signed)
[name] PA with [facility] request COVID 19 test for pt. Due to exposure. Clinic phone # [phone]. Fax  [phone].

## 2019-02-20 ENCOUNTER — Other Ambulatory Visit: Payer: Self-pay

## 2019-02-20 DIAGNOSIS — Z20822 Contact with and (suspected) exposure to covid-19: Secondary | ICD-10-CM

## 2019-02-22 LAB — NOVEL CORONAVIRUS, NAA: SARS-CoV-2, NAA: NOT DETECTED

## 2019-02-26 ENCOUNTER — Other Ambulatory Visit: Payer: Self-pay | Admitting: Radiology

## 2019-02-26 NOTE — Telephone Encounter (Signed)
Rx refill request.   [employer].

## 2019-02-27 ENCOUNTER — Ambulatory Visit: Payer: Self-pay | Admitting: Physician Assistant

## 2019-02-27 ENCOUNTER — Encounter: Payer: Self-pay | Admitting: Physician Assistant

## 2019-02-27 DIAGNOSIS — E1165 Type 2 diabetes mellitus with hyperglycemia: Secondary | ICD-10-CM

## 2019-02-27 MED ORDER — OXYCODONE-ACETAMINOPHEN 10-325 MG PO TABS: 1.0000 | ORAL_TABLET | ORAL | 0 refills | Status: DC | PRN

## 2019-02-27 NOTE — Progress Notes (Signed)
   There were no vitals taken for this visit.   Subjective:    Patient ID: [name], male    DOB: Apr 25, 1966, 53 y.o.   MRN: [id number]  HPI: [name] is a 53 y.o. male presenting on 02/27/2019 for No chief complaint on file.   HPI   This is a telemedicine visit due to coronavirus pandemic.  It is via Telephone as pt does not have video capabilities  I connected with  [name] on 02/27/19 by a video enabled telemedicine application and verified that I am speaking with the correct person using two identifiers.   I discussed the limitations of evaluation and management by telemedicine. The patient expressed understanding and agreed to proceed.   Pt is at home.  Provider is at office  Pt did not get labs drawn  Pt says he is checking his bs- was 148 this morning. Yesterday 143.[name]  He is currently using 26 u lantus  Insulin.  Pt has had no lows.    Relevant past medical, surgical, family and social history reviewed and updated as indicated. Interim medical history since our last visit reviewed. Allergies and medications reviewed and updated.   Current Outpatient Medications:  .  insulin glargine (LANTUS) 100 UNIT/ML injection, Inject 24 Units into the skin at bedtime. , Disp: , Rfl:  .  lisinopril (PRINIVIL,ZESTRIL) 20 MG tablet, Take 1 tablet (20 mg total) by mouth daily., Disp: 90 tablet, Rfl: 1 .  metFORMIN (GLUCOPHAGE) 1000 MG tablet, Take 1 tablet (1,000 mg total) by mouth 2 (two) times daily with a meal., Disp: 180 tablet, Rfl: 1 .  metoprolol tartrate (LOPRESSOR) 50 MG tablet, Take 1 tablet (50 mg total) by mouth 2 (two) times daily., Disp: 180 tablet, Rfl: 1 .  oxyCODONE-acetaminophen (PERCOCET) 10-325 MG tablet, Take 1 tablet by mouth every 4 (four) hours as needed for pain., Disp: 170 tablet, Rfl: 0 .  sitaGLIPtin (JANUVIA) 100 MG tablet, Take 1 tablet (100 mg total) by mouth daily., Disp: 90 tablet, Rfl: 0    Review of Systems  Per HPI unless specifically  indicated above     Objective:    There were no vitals taken for this visit.  Wt Readings from Last 3 Encounters:  11/08/18 (!) 413 lb (187.3 kg)  10/17/18 (!) 413 lb 8 oz (187.6 kg)  09/14/18 (!) 404 lb 8 oz (183.5 kg)    Physical Exam Pulmonary:     Effort: No respiratory distress.  Neurological:     Mental Status: He is alert and oriented to person, place, and time.  Psychiatric:        Speech: Speech normal.        Behavior: Behavior is cooperative.     Results for orders placed or performed in visit on 02/20/19  Novel Coronavirus, NAA ([facility])  Result Value Ref Range   SARS-CoV-2, NAA Not Detected Not Detected      Assessment & Plan:    Encounter Diagnosis  Name Primary?  [name] Uncontrolled type 2 diabetes mellitus with hyperglycemia ([place]) Yes     -Pt to Increase lantus to 28 units -he needs to Get labs drawn -will have follow up telephone visit 2 week.  Pt to contact office sooner prn

## 2019-03-12 ENCOUNTER — Ambulatory Visit: Payer: Self-pay | Admitting: Physician Assistant

## 2019-03-12 ENCOUNTER — Encounter: Payer: Self-pay | Admitting: Physician Assistant

## 2019-03-12 DIAGNOSIS — E1165 Type 2 diabetes mellitus with hyperglycemia: Secondary | ICD-10-CM

## 2019-03-12 NOTE — Progress Notes (Signed)
There were no vitals taken for this visit.   Subjective:    Patient ID: [name], male    DOB: 08-02-1966, 53 y.o.   MRN: [id number]  HPI: [name] is a 53 y.o. male presenting on 03/12/2019 for Diabetes   HPI   This is a telemedicine visit due to coronavirus pandemic. It is via telephone as pt does not have a cellular phone with video capabilities  I connected with  [name] on 03/13/19 by a video enabled telemedicine application and verified that I am speaking with the correct person using two identifiers.   I discussed the limitations of evaluation and management by telemedicine. The patient expressed understanding and agreed to proceed.  Pt is at [employer].  He is offered to reschedule to when he is at home but says he is good now.  Provider is at office.     Pt Increased his  lantus to 28 units after last OV as discussed  This a.m fbs was 132.  He doesn't have his bs log with him.  Yesterday fbs was 146.  Pt says he is feeling well and he has no complaints.     Relevant past medical, surgical, family and social history reviewed and updated as indicated. Interim medical history since our last visit reviewed. Allergies and medications reviewed and updated.   Current Outpatient Medications:  .  insulin glargine (LANTUS) 100 UNIT/ML injection, Inject 28 Units into the skin at bedtime. , Disp: , Rfl:  .  lisinopril (PRINIVIL,ZESTRIL) 20 MG tablet, Take 1 tablet (20 mg total) by mouth daily., Disp: 90 tablet, Rfl: 1 .  metFORMIN (GLUCOPHAGE) 1000 MG tablet, Take 1 tablet (1,000 mg total) by mouth 2 (two) times daily with a meal., Disp: 180 tablet, Rfl: 1 .  metoprolol tartrate (LOPRESSOR) 50 MG tablet, Take 1 tablet (50 mg total) by mouth 2 (two) times daily. (Patient taking differently: Take 50 mg by mouth daily. ), Disp: 180 tablet, Rfl: 1 .  oxyCODONE-acetaminophen (PERCOCET) 10-325 MG tablet, Take 1 tablet by mouth every 4 (four) hours as needed for pain., Disp: 170  tablet, Rfl: 0 .  sitaGLIPtin (JANUVIA) 100 MG tablet, Take 1 tablet (100 mg total) by mouth daily., Disp: 90 tablet, Rfl: 0    Review of Systems  Per HPI unless specifically indicated above     Objective:    There were no vitals taken for this visit.  Wt Readings from Last 3 Encounters:  11/08/18 (!) 413 lb (187.3 kg)  10/17/18 (!) 413 lb 8 oz (187.6 kg)  09/14/18 (!) 404 lb 8 oz (183.5 kg)    Physical Exam Pulmonary:     Effort: No respiratory distress.  Neurological:     Mental Status: He is alert and oriented to person, place, and time.  Psychiatric:        Speech: Speech normal.        Behavior: Behavior is cooperative.     Results for orders placed or performed in visit on 02/20/19  Novel Coronavirus, NAA ([facility])  Result Value Ref Range   SARS-CoV-2, NAA Not Detected Not Detected      Assessment & Plan:   Encounter Diagnosis  Name Primary?  [name] Uncontrolled type 2 diabetes mellitus with hyperglycemia (HCC) Yes     -Pt to Increase lantus to 30 units.  He is counseled to monitor fbs.  He is reminded to contact office for fbs < 70 or > 300 -pt to follow up  to review bs log 1 month.  He is to contact office sooner prn.  Discussed with pt that he should be at home for his appointment and have his bs log with him.  Pt is reminded that he must must must get his labs updated prior to that appointment

## 2019-03-21 ENCOUNTER — Encounter: Payer: Self-pay | Admitting: Orthopaedic Surgery

## 2019-03-21 ENCOUNTER — Ambulatory Visit (INDEPENDENT_AMBULATORY_CARE_PROVIDER_SITE_OTHER): Payer: Self-pay | Admitting: Orthopaedic Surgery

## 2019-03-21 ENCOUNTER — Other Ambulatory Visit: Payer: Self-pay

## 2019-03-21 DIAGNOSIS — M25562 Pain in left knee: Secondary | ICD-10-CM

## 2019-03-21 DIAGNOSIS — M25561 Pain in right knee: Secondary | ICD-10-CM

## 2019-03-21 DIAGNOSIS — G8929 Other chronic pain: Secondary | ICD-10-CM

## 2019-03-21 NOTE — Progress Notes (Signed)
Virtual Visit via Telephone Note  I connected with@ on 03/21/19 at  9:20 AM EDT by telephone and verified that I am speaking with the correct person using two identifiers.  Location: Patient: home Provider: home   I discussed the limitations, risks, security and privacy concerns of performing an evaluation and management service by telephone and the availability of in person appointments. I also discussed with the patient that there may be a patient responsible charge related to this service. The patient expressed understanding and agreed to proceed.   History of Present Illness: He has chronic pain of both knees.  He has no new trauma.  He has swelling and popping but no giving way.  He was tested for COVID-19 recently and was negative.  He prefers to have telemedicine visits.  We will continue this.  He has run out of his pain medicine and I will refill it.   Observations/Objective: Per above.  Assessment and Plan: Encounter Diagnoses  Name Primary?  . Chronic pain of right knee Yes  . Chronic pain of left knee      Follow Up Instructions: Three months.  I have reviewed the [facility] web site prior to prescribing narcotic medicine for this patient.      I discussed the assessment and treatment plan with the patient. The patient was provided an opportunity to ask questions and all were answered. The patient agreed with the plan and demonstrated an understanding of the instructions.   The patient was advised to call back or seek an in-person evaluation if the symptoms worsen or if the condition fails to improve as anticipated.  I provided 8 minutes of non-face-to-face time during this encounter.   Sanjuana Kava, MD

## 2019-03-27 ENCOUNTER — Telehealth: Payer: Self-pay | Admitting: Orthopaedic Surgery

## 2019-03-27 MED ORDER — OXYCODONE-ACETAMINOPHEN 10-325 MG PO TABS: 1.0000 | ORAL_TABLET | ORAL | 0 refills | Status: DC | PRN

## 2019-03-27 NOTE — Telephone Encounter (Signed)
Patient requests refill on Oxycodone/Acetaminophen 10-325  Mgs. Qty 170  Sig: Take 1 tablet by mouth every 4 (four) hours as needed for pain.  Patient states he uses [facility]

## 2019-03-29 ENCOUNTER — Other Ambulatory Visit (HOSPITAL_COMMUNITY)
Admission: RE | Admit: 2019-03-29 | Discharge: 2019-03-29 | Disposition: A | Discharge status: Home / Self Care | Payer: Self-pay | Source: Ambulatory Visit | Attending: Physician Assistant | Admitting: Physician Assistant

## 2019-03-29 DIAGNOSIS — E785 Hyperlipidemia, unspecified: Secondary | ICD-10-CM | POA: Insufficient documentation

## 2019-03-29 DIAGNOSIS — I10 Essential (primary) hypertension: Secondary | ICD-10-CM | POA: Insufficient documentation

## 2019-03-29 DIAGNOSIS — E1165 Type 2 diabetes mellitus with hyperglycemia: Secondary | ICD-10-CM | POA: Insufficient documentation

## 2019-03-29 LAB — COMPREHENSIVE METABOLIC PANEL
ALT: 22 U/L (ref 0–44)
AST: 20 U/L (ref 15–41)
Albumin: 3.6 g/dL (ref 3.5–5.0)
Alkaline Phosphatase: 100 U/L (ref 38–126)
Anion gap: 7 (ref 5–15)
BUN: 10 mg/dL (ref 6–20)
CO2: 28 mmol/L (ref 22–32)
Calcium: 9.1 mg/dL (ref 8.9–10.3)
Chloride: 103 mmol/L (ref 98–111)
Creatinine, Ser: 0.69 mg/dL (ref 0.61–1.24)
GFR calc Af Amer: >60 mL/min (ref >60)
GFR calc non Af Amer: >60 mL/min (ref >60)
Glucose, Bld: 241 mg/dL — ABNORMAL HIGH (ref 70–99)
Potassium: 4.5 mmol/L (ref 3.5–5.1)
Sodium: 138 mmol/L (ref 135–145)
Total Bilirubin: 1 mg/dL (ref 0.3–1.2)
Total Protein: 7.5 g/dL (ref 6.5–8.1)

## 2019-03-29 LAB — LIPID PANEL
Cholesterol: 205 mg/dL — ABNORMAL HIGH (ref 0–200)
HDL: 44 mg/dL (ref >40)
LDL Cholesterol: 140 mg/dL — ABNORMAL HIGH (ref 0–99)
Total CHOL/HDL Ratio: 4.7 RATIO
Triglycerides: 107 mg/dL (ref <150)
VLDL: 21 mg/dL (ref 0–40)

## 2019-03-29 LAB — HEMOGLOBIN A1C
Hgb A1c MFr Bld: 10.2 % — ABNORMAL HIGH (ref 4.8–5.6)
Mean Plasma Glucose: 246.04 mg/dL

## 2019-04-02 ENCOUNTER — Encounter: Payer: Self-pay | Admitting: Physician Assistant

## 2019-04-02 ENCOUNTER — Ambulatory Visit: Payer: Self-pay | Admitting: Physician Assistant

## 2019-04-02 DIAGNOSIS — F509 Eating disorder, unspecified: Secondary | ICD-10-CM

## 2019-04-02 DIAGNOSIS — I10 Essential (primary) hypertension: Secondary | ICD-10-CM

## 2019-04-02 DIAGNOSIS — E1165 Type 2 diabetes mellitus with hyperglycemia: Secondary | ICD-10-CM

## 2019-04-02 DIAGNOSIS — E785 Hyperlipidemia, unspecified: Secondary | ICD-10-CM

## 2019-04-02 MED ORDER — INSULIN ASPART 100 UNIT/ML ~~LOC~~ SOLN: SUBCUTANEOUS | 0 refills | Status: DC

## 2019-04-02 MED ORDER — METFORMIN HCL 500 MG PO TABS: 500.0000 mg | ORAL_TABLET | Freq: Two times a day (BID) | ORAL | 1 refills | Status: DC

## 2019-04-02 MED ORDER — METOPROLOL TARTRATE 50 MG PO TABS: 50.0000 mg | ORAL_TABLET | Freq: Two times a day (BID) | ORAL | 1 refills | Status: DC

## 2019-04-02 MED ORDER — LISINOPRIL 20 MG PO TABS: 20.0000 mg | ORAL_TABLET | Freq: Every day | ORAL | 1 refills | Status: DC

## 2019-04-02 MED ORDER — SITAGLIPTIN PHOSPHATE 100 MG PO TABS: 100.0000 mg | ORAL_TABLET | Freq: Every day | ORAL | 0 refills | Status: DC

## 2019-04-02 MED ORDER — ATORVASTATIN CALCIUM 20 MG PO TABS: 20.0000 mg | ORAL_TABLET | Freq: Every day | ORAL | 1 refills | Status: DC

## 2019-04-02 NOTE — Progress Notes (Signed)
There were no vitals taken for this visit.   Subjective:    Patient ID: [name], male    DOB: 1965/11/20, 53 y.o.   MRN: [id number]  HPI: [name] is a 53 y.o. male presenting on 04/02/2019 for Diabetes   HPI     This is a telemedicine appointment due to coronavirus pandemic.  It is via telephone as pt says [name] doesn't have a cellular telephone with video capabilities  I connected with  [name] on 04/02/19 by a video enabled telemedicine application and verified that I am speaking with the correct person using two identifiers.   I discussed the limitations of evaluation and management by telemedicine. The patient expressed understanding and agreed to proceed.  Pt is at home.  Provider is at office    [name] is checking his blood sugar  This am -139 Recently-  145 133 130s-140s-    Highest 191- [name] says that was when [name] was "eating crazy"  Pt has been encouraged many times to go to [facility] for counseling.  [name] is not getting counseling at this time.  [name] says [name] has cut back on the "eating crazy" some.  [name] says [name] is feeling pretty good now.  [name] has no complaints today except that [name] says the metformin makes him feel bloated and sometimes gives him diarrhea.   Pt had been declining to get his labs drawn for a multitude of reasons so none since November 2019 until [name] finally went earlier this week.  Discussed with pt that regular testing is important to help guide treatment and monitor for negative effects.  [name] states understanding.     Relevant past medical, surgical, family and social history reviewed and updated as indicated. Interim medical history since our last visit reviewed. Allergies and medications reviewed and updated.   Current Outpatient Medications:  .  insulin glargine (LANTUS) 100 UNIT/ML injection, Inject 30 Units into the skin at bedtime. , Disp: , Rfl:  .  lisinopril (PRINIVIL,ZESTRIL) 20 MG tablet, Take 1 tablet (20 mg total) by mouth daily., Disp: 90  tablet, Rfl: 1 .  metFORMIN (GLUCOPHAGE) 1000 MG tablet, Take 1 tablet (1,000 mg total) by mouth 2 (two) times daily with a meal., Disp: 180 tablet, Rfl: 1 .  metoprolol tartrate (LOPRESSOR) 50 MG tablet, Take 1 tablet (50 mg total) by mouth 2 (two) times daily., Disp: 180 tablet, Rfl: 1 .  oxyCODONE-acetaminophen (PERCOCET) 10-325 MG tablet, Take 1 tablet by mouth every 4 (four) hours as needed for pain., Disp: 170 tablet, Rfl: 0 .  sitaGLIPtin (JANUVIA) 100 MG tablet, Take 1 tablet (100 mg total) by mouth daily., Disp: 90 tablet, Rfl: 0   Review of Systems  Per HPI unless specifically indicated above     Objective:    There were no vitals taken for this visit.  Wt Readings from Last 3 Encounters:  11/08/18 (!) 413 lb (187.3 kg)  10/17/18 (!) 413 lb 8 oz (187.6 kg)  09/14/18 (!) 404 lb 8 oz (183.5 kg)    Physical Exam Pulmonary:     Effort: No respiratory distress.  Neurological:     Mental Status: [name] is alert and oriented to person, place, and time.  Psychiatric:        Attention and Perception: Attention normal.        Speech: Speech normal.        Behavior: Behavior is cooperative.     Results for orders placed or performed  during the hospital encounter of 03/29/19  Lipid panel  Result Value Ref Range   Cholesterol 205 (H) 0 - 200 mg/dL   Triglycerides 107 <150 mg/dL   HDL 44 >40 mg/dL   Total CHOL/HDL Ratio 4.7 RATIO   VLDL 21 0 - 40 mg/dL   LDL Cholesterol 140 (H) 0 - 99 mg/dL  Hemoglobin A1c  Result Value Ref Range   Hgb A1c MFr Bld 10.2 (H) 4.8 - 5.6 %   Mean Plasma Glucose 246.04 mg/dL  Comprehensive metabolic panel  Result Value Ref Range   Sodium 138 135 - 145 mmol/L   Potassium 4.5 3.5 - 5.1 mmol/L   Chloride 103 98 - 111 mmol/L   CO2 28 22 - 32 mmol/L   Glucose, Bld 241 (H) 70 - 99 mg/dL   BUN 10 6 - 20 mg/dL   Creatinine, Ser 0.69 0.61 - 1.24 mg/dL   Calcium 9.1 8.9 - 10.3 mg/dL   Total Protein 7.5 6.5 - 8.1 g/dL   Albumin 3.6 3.5 - 5.0 g/dL    AST 20 15 - 41 U/L   ALT 22 0 - 44 U/L   Alkaline Phosphatase 100 38 - 126 U/L   Total Bilirubin 1.0 0.3 - 1.2 mg/dL   GFR calc non Af Amer >60 >60 mL/min   GFR calc Af Amer >60 >60 mL/min   Anion gap 7 5 - 15      Assessment & Plan:   Encounter Diagnoses  Name Primary?  [name] Uncontrolled type 2 diabetes mellitus with hyperglycemia ([place]) Yes  . Eating disorder, unspecified type   . Morbid obesity ([place])   . Hyperlipidemia, unspecified hyperlipidemia type   . Essential hypertension       -reviewed labs with pt -encouraged pt to call [facility] to get help with his eating disorder -will Add atorvastatin for lipids -pt to Increase lantus to 30units daily.  Will Cut back metformatin to 500mg  due to GI side effects. -pt to Get back on novolog ssi tid.  [name] is to monitor his bs and is reminded to call office for fbs < 70 or > 300 -pt to follow up in 1 month to review bs readings.  [name] is to contact office sooner prn

## 2019-04-24 ENCOUNTER — Telehealth: Payer: Self-pay | Admitting: Orthopaedic Surgery

## 2019-04-24 MED ORDER — OXYCODONE-ACETAMINOPHEN 10-325 MG PO TABS: 1.0000 | ORAL_TABLET | ORAL | 0 refills | Status: DC | PRN

## 2019-04-24 NOTE — Telephone Encounter (Signed)
Patient requests refill on Oxycodone/Acetaminophen 10-325  Mgs.  Qty  170  Sig: Take 1 tablet by mouth every 4 (four) hours as needed for pain.  Patient states he uses [facility]

## 2019-05-01 ENCOUNTER — Encounter: Payer: Self-pay | Admitting: Physician Assistant

## 2019-05-01 ENCOUNTER — Ambulatory Visit: Payer: Self-pay | Admitting: Physician Assistant

## 2019-05-01 DIAGNOSIS — I10 Essential (primary) hypertension: Secondary | ICD-10-CM

## 2019-05-01 DIAGNOSIS — E785 Hyperlipidemia, unspecified: Secondary | ICD-10-CM

## 2019-05-01 DIAGNOSIS — Z125 Encounter for screening for malignant neoplasm of prostate: Secondary | ICD-10-CM

## 2019-05-01 DIAGNOSIS — E1165 Type 2 diabetes mellitus with hyperglycemia: Secondary | ICD-10-CM

## 2019-05-01 DIAGNOSIS — F509 Eating disorder, unspecified: Secondary | ICD-10-CM

## 2019-05-01 NOTE — Progress Notes (Signed)
There were no vitals taken for this visit.   Subjective:    Patient ID: [name], male    DOB: Nov 22, 1965, 53 y.o.   MRN: [id number]  HPI: [name] is a 53 y.o. male presenting on 05/01/2019 for No chief complaint on file.   HPI   This is a telemedicine appointment due to coronavirus pandemic.  It is via Telephone as pt does not have a video enabled device  I connected with  [name] on 05/01/19 by a video enabled telemedicine application and verified that I am speaking with the correct person using two identifiers.   I discussed the limitations of evaluation and management by telemedicine. The patient expressed understanding and agreed to proceed.  Pt is at home.  Provider is at office.    Pt is currently Using 36u lantus daily  He is Monitoring his bs- running 138 this morning.  Yesterday 142. Sunday 148. Saturday 145.   He cut back his metformin to 500mg  bid.  His diarrhea has improved.  The diarrhea has not completely resolved  He has contacted [facility] but hasn't been yet-- he says he has appointment for October.   Pt is not currently exercising.      Relevant past medical, surgical, family and social history reviewed and updated as indicated. Interim medical history since our last visit reviewed. Allergies and medications reviewed and updated.   Current Outpatient Medications:  .  atorvastatin (LIPITOR) 20 MG tablet, Take 1 tablet (20 mg total) by mouth daily., Disp: 90 tablet, Rfl: 1 .  insulin aspart (NOVOLOG) 100 UNIT/ML injection, Per ssi, Disp: 5 mL, Rfl: 0 .  insulin glargine (LANTUS) 100 UNIT/ML injection, Inject 30 Units into the skin at bedtime. , Disp: , Rfl:  .  lisinopril (ZESTRIL) 20 MG tablet, Take 1 tablet (20 mg total) by mouth daily., Disp: 90 tablet, Rfl: 1 .  metFORMIN (GLUCOPHAGE) 500 MG tablet, Take 1 tablet (500 mg total) by mouth 2 (two) times daily with a meal., Disp: 180 tablet, Rfl: 1 .  metoprolol tartrate (LOPRESSOR) 50 MG tablet,  Take 1 tablet (50 mg total) by mouth 2 (two) times daily., Disp: 180 tablet, Rfl: 1 .  oxyCODONE-acetaminophen (PERCOCET) 10-325 MG tablet, Take 1 tablet by mouth every 4 (four) hours as needed for pain., Disp: 170 tablet, Rfl: 0 .  sitaGLIPtin (JANUVIA) 100 MG tablet, Take 1 tablet (100 mg total) by mouth daily., Disp: 90 tablet, Rfl: 0   Review of Systems  Per HPI unless specifically indicated above     Objective:    There were no vitals taken for this visit.  Wt Readings from Last 3 Encounters:  11/08/18 (!) 413 lb (187.3 kg)  10/17/18 (!) 413 lb 8 oz (187.6 kg)  09/14/18 (!) 404 lb 8 oz (183.5 kg)    Physical Exam Pulmonary:     Effort: No respiratory distress.  Neurological:     Mental Status: He is alert and oriented to person, place, and time.  Psychiatric:        Attention and Perception: Attention normal.        Speech: Speech normal.        Behavior: Behavior is cooperative.          Assessment & Plan:    Encounter Diagnoses  Name Primary?  [name] Uncontrolled type 2 diabetes mellitus with hyperglycemia ([place]) Yes  . Hyperlipidemia, unspecified hyperlipidemia type   . Essential hypertension   . Morbid obesity ([place])   .  Eating disorder, unspecified type        -no changes to medication today.  Counseled pt to watch diabetic diet  - strongly encouraged regular exercise/walking.  Discussed that he will need to start slow but gradually increase time as he goes along over the next several weeks to months  -pt is reminded to contact office for fbs < 70 or > 300  -pt to follow up 2 months.  Will update labs before next appointment.  He is to contact office sooner prn

## 2019-05-22 ENCOUNTER — Telehealth: Payer: Self-pay | Admitting: Orthopaedic Surgery

## 2019-05-22 MED ORDER — OXYCODONE-ACETAMINOPHEN 10-325 MG PO TABS: 1.0000 | ORAL_TABLET | ORAL | 0 refills | Status: DC | PRN

## 2019-05-22 NOTE — Telephone Encounter (Signed)
Patient requests refill on Oxycodone/Acetaminophen 10-325  Mgs. Qty 170 ° °Sig: Take 1 tablet by mouth every 4 (four) hours as needed for pain. ° °Patient states he uses [facility] °

## 2019-06-04 ENCOUNTER — Ambulatory Visit: Payer: Self-pay | Admitting: Physician Assistant

## 2019-06-13 ENCOUNTER — Other Ambulatory Visit: Payer: Self-pay

## 2019-06-13 ENCOUNTER — Ambulatory Visit (INDEPENDENT_AMBULATORY_CARE_PROVIDER_SITE_OTHER): Payer: Self-pay | Admitting: Orthopaedic Surgery

## 2019-06-13 ENCOUNTER — Encounter: Payer: Self-pay | Admitting: Orthopaedic Surgery

## 2019-06-13 DIAGNOSIS — M25561 Pain in right knee: Secondary | ICD-10-CM

## 2019-06-13 DIAGNOSIS — M25562 Pain in left knee: Secondary | ICD-10-CM

## 2019-06-13 DIAGNOSIS — G8929 Other chronic pain: Secondary | ICD-10-CM

## 2019-06-13 DIAGNOSIS — Z6841 Body Mass Index (BMI) 40.0 and over, adult: Secondary | ICD-10-CM

## 2019-06-13 NOTE — Progress Notes (Signed)
Virtual Visit via Telephone Note  I connected with@ on 06/13/19 at  9:10 AM EDT by telephone and verified that I am speaking with the correct person using two identifiers.  Location: Patient: home Provider: home   I discussed the limitations, risks, security and privacy concerns of performing an evaluation and management service by telephone and the availability of in person appointments. I also discussed with the patient that there may be a patient responsible charge related to this service. The patient expressed understanding and agreed to proceed.   History of Present Illness: He continues to have pain of both knees, more on the right.  He has no new trauma.  He has no giving way.  He is taking his medicine.  His weight is stable and he had not lost much.  He tries to be as active as he can.   Observations/Objective: Per above.  Assessment and Plan: Encounter Diagnoses  Name Primary?  . Chronic pain of right knee Yes  . Chronic pain of left knee   . Morbid obesity due to excess calories ([place])   . Body mass index 60.0-69.9, adult (HCC)      Follow Up Instructions: 3 months, I will refill medicine when due.   I discussed the assessment and treatment plan with the patient. The patient was provided an opportunity to ask questions and all were answered. The patient agreed with the plan and demonstrated an understanding of the instructions.   The patient was advised to call back or seek an in-person evaluation if the symptoms worsen or if the condition fails to improve as anticipated.  I provided 6 minutes of non-face-to-face time during this encounter.   Sanjuana Kava, MD

## 2019-06-19 ENCOUNTER — Telehealth: Payer: Self-pay | Admitting: Orthopaedic Surgery

## 2019-06-19 MED ORDER — OXYCODONE-ACETAMINOPHEN 10-325 MG PO TABS: 1.0000 | ORAL_TABLET | ORAL | 0 refills | Status: DC | PRN

## 2019-06-19 NOTE — Telephone Encounter (Signed)
Patient requests refill: oxyCODONE-acetaminophen (PERCOCET) 10-325 MG tablet   - [facility]

## 2019-07-02 ENCOUNTER — Other Ambulatory Visit (HOSPITAL_COMMUNITY)
Admission: RE | Admit: 2019-07-02 | Discharge: 2019-07-02 | Disposition: A | Discharge status: Home / Self Care | Payer: Self-pay | Source: Ambulatory Visit | Attending: Physician Assistant | Admitting: Physician Assistant

## 2019-07-02 ENCOUNTER — Ambulatory Visit: Payer: Self-pay | Admitting: Physician Assistant

## 2019-07-02 DIAGNOSIS — E785 Hyperlipidemia, unspecified: Secondary | ICD-10-CM | POA: Insufficient documentation

## 2019-07-02 DIAGNOSIS — E1165 Type 2 diabetes mellitus with hyperglycemia: Secondary | ICD-10-CM | POA: Insufficient documentation

## 2019-07-02 DIAGNOSIS — I10 Essential (primary) hypertension: Secondary | ICD-10-CM | POA: Insufficient documentation

## 2019-07-02 DIAGNOSIS — Z125 Encounter for screening for malignant neoplasm of prostate: Secondary | ICD-10-CM | POA: Insufficient documentation

## 2019-07-02 LAB — COMPREHENSIVE METABOLIC PANEL
ALT: 19 U/L (ref 0–44)
AST: 16 U/L (ref 15–41)
Albumin: 3.5 g/dL (ref 3.5–5.0)
Alkaline Phosphatase: 97 U/L (ref 38–126)
Anion gap: 9 (ref 5–15)
BUN: 11 mg/dL (ref 6–20)
CO2: 25 mmol/L (ref 22–32)
Calcium: 9.1 mg/dL (ref 8.9–10.3)
Chloride: 105 mmol/L (ref 98–111)
Creatinine, Ser: 0.64 mg/dL (ref 0.61–1.24)
GFR calc Af Amer: >60 mL/min (ref >60)
GFR calc non Af Amer: >60 mL/min (ref >60)
Glucose, Bld: 178 mg/dL — ABNORMAL HIGH (ref 70–99)
Potassium: 4.3 mmol/L (ref 3.5–5.1)
Sodium: 139 mmol/L (ref 135–145)
Total Bilirubin: 0.4 mg/dL (ref 0.3–1.2)
Total Protein: 7.5 g/dL (ref 6.5–8.1)

## 2019-07-02 LAB — LIPID PANEL
Cholesterol: 142 mg/dL (ref 0–200)
HDL: 41 mg/dL (ref >40)
LDL Cholesterol: 82 mg/dL (ref 0–99)
Total CHOL/HDL Ratio: 3.5 RATIO
Triglycerides: 97 mg/dL (ref <150)
VLDL: 19 mg/dL (ref 0–40)

## 2019-07-02 LAB — PSA: Prostatic Specific Antigen: 0.16 ng/mL (ref 0.00–4.00)

## 2019-07-02 LAB — HEMOGLOBIN A1C
Hgb A1c MFr Bld: 10 % — ABNORMAL HIGH (ref 4.8–5.6)
Mean Plasma Glucose: 240.3 mg/dL

## 2019-07-03 ENCOUNTER — Ambulatory Visit: Payer: Self-pay | Admitting: Physician Assistant

## 2019-07-05 ENCOUNTER — Encounter: Payer: Self-pay | Admitting: Physician Assistant

## 2019-07-05 ENCOUNTER — Ambulatory Visit: Payer: Self-pay | Admitting: Physician Assistant

## 2019-07-05 ENCOUNTER — Other Ambulatory Visit: Payer: Self-pay

## 2019-07-05 VITALS — BP 130/84 | HR 76 | Temp 98.0°F | Wt >= 6400 oz

## 2019-07-05 DIAGNOSIS — I10 Essential (primary) hypertension: Secondary | ICD-10-CM

## 2019-07-05 DIAGNOSIS — L03115 Cellulitis of right lower limb: Secondary | ICD-10-CM

## 2019-07-05 DIAGNOSIS — F509 Eating disorder, unspecified: Secondary | ICD-10-CM

## 2019-07-05 DIAGNOSIS — Z1211 Encounter for screening for malignant neoplasm of colon: Secondary | ICD-10-CM

## 2019-07-05 DIAGNOSIS — Z6841 Body Mass Index (BMI) 40.0 and over, adult: Secondary | ICD-10-CM

## 2019-07-05 DIAGNOSIS — E1165 Type 2 diabetes mellitus with hyperglycemia: Secondary | ICD-10-CM

## 2019-07-05 DIAGNOSIS — E785 Hyperlipidemia, unspecified: Secondary | ICD-10-CM

## 2019-07-05 MED ORDER — SULFAMETHOXAZOLE-TRIMETHOPRIM 800-160 MG PO TABS: 1.0000 | ORAL_TABLET | Freq: Two times a day (BID) | ORAL | 0 refills | Status: DC

## 2019-07-05 MED ORDER — INSULIN GLARGINE 100 UNIT/ML ~~LOC~~ SOLN: 40.0000 [IU] | Freq: Every day | SUBCUTANEOUS | 0 refills | Status: DC

## 2019-07-05 NOTE — Progress Notes (Signed)
BP 130/84   Pulse 76   Temp 98 F (36.7 C)   Wt (!) 420 lb (190.5 kg)   SpO2 98%   BMI 68.83 kg/m    Subjective:    Patient ID: [name], male    DOB: [birth year], 53 y.o.   MRN: [id number]  HPI: [name] is a 53 y.o. male presenting on 07/05/2019 for No chief complaint on file.   HPI  Pt had a negative covid 19 screening questionnaire.   Pt is 53yo male with history of uncontrolled diabetes, hyperlipidemia, hypertension, morbid obesity and diabetic lower extremity ulcer.    Pt is currently using lantus 36 units and ssi novolog in addition to metformin and [place].    Pt says that When he "eats right", his blood sugars are okay.  He then says that lately he has "been eating crazy".  He is monitoring his blood sugars (but didn't bring his log with him) and he says he has had no lows-  He says he hasn't seen anything less than 110 and it usually runs around 180.    Pt has reported issues in the past with his eating getting out of hand and eating due to stress.     Pt says he is not feeling sick.  He is not having any fevers, chest pains or shortness of breath.    Relevant past medical, surgical, family and social history reviewed and updated as indicated. Interim medical history since our last visit reviewed. Allergies and medications reviewed and updated.   Current Outpatient Medications:  .  atorvastatin (LIPITOR) 20 MG tablet, Take 1 tablet (20 mg total) by mouth daily., Disp: 90 tablet, Rfl: 1 .  insulin aspart (NOVOLOG) 100 UNIT/ML injection, Per ssi, Disp: 5 mL, Rfl: 0 .  insulin glargine (LANTUS) 100 UNIT/ML injection, Inject 36 Units into the skin at bedtime., Disp: , Rfl:  .  lisinopril (ZESTRIL) 20 MG tablet, Take 1 tablet (20 mg total) by mouth daily., Disp: 90 tablet, Rfl: 1 .  metFORMIN (GLUCOPHAGE) 500 MG tablet, Take 1 tablet (500 mg total) by mouth 2 (two) times daily with a meal., Disp: 180 tablet, Rfl: 1 .  metoprolol tartrate (LOPRESSOR) 50 MG tablet,  Take 1 tablet (50 mg total) by mouth 2 (two) times daily., Disp: 180 tablet, Rfl: 1 .  oxyCODONE-acetaminophen (PERCOCET) 10-325 MG tablet, Take 1 tablet by mouth every 4 (four) hours as needed for pain., Disp: 170 tablet, Rfl: 0 .  sitaGLIPtin (JANUVIA) 100 MG tablet, Take 1 tablet (100 mg total) by mouth daily., Disp: 90 tablet, Rfl: 0    Review of Systems  Per HPI unless specifically indicated above     Objective:    BP 130/84   Pulse 76   Temp 98 F (36.7 C)   Wt (!) 420 lb (190.5 kg)   SpO2 98%   BMI 68.83 kg/m   Wt Readings from Last 3 Encounters:  07/05/19 (!) 420 lb (190.5 kg)  11/08/18 (!) 413 lb (187.3 kg)  10/17/18 (!) 413 lb 8 oz (187.6 kg)    Physical Exam Vitals signs reviewed.  Constitutional:      General: He is not in acute distress.    Appearance: He is well-developed. He is obese. He is not ill-appearing.  HENT:     Head: Normocephalic and atraumatic.  Neck:     Musculoskeletal: Neck supple.  Cardiovascular:     Rate and Rhythm: Normal rate and regular rhythm.  Pulmonary:  Effort: Pulmonary effort is normal. No respiratory distress.     Breath sounds: Normal breath sounds. No wheezing.  Abdominal:     General: Bowel sounds are normal.     Palpations: Abdomen is soft.     Tenderness: There is no abdominal tenderness.  Musculoskeletal:     Comments: There is thickening of the skin lateral R lower leg where pt says he had a diabetic ulcer in the past.  There is surrounding redness and there is tenderness.  One area appears to be mildly fluctuant but there is no open wound and there is no drainage.    Pt has chronic thickening of the skin of both lower extremities due to stasis changes.    Lymphadenopathy:     Cervical: No cervical adenopathy.  Skin:    General: Skin is warm and dry.  Neurological:     Mental Status: He is alert and oriented to person, place, and time.  Psychiatric:        Attention and Perception: Attention normal.         Speech: Speech normal.        Behavior: Behavior normal. Behavior is cooperative.     Results for orders placed or performed during the hospital encounter of 07/02/19  Hemoglobin A1c  Result Value Ref Range   Hgb A1c MFr Bld 10.0 (H) 4.8 - 5.6 %   Mean Plasma Glucose 240.3 mg/dL  Comprehensive metabolic panel  Result Value Ref Range   Sodium 139 135 - 145 mmol/L   Potassium 4.3 3.5 - 5.1 mmol/L   Chloride 105 98 - 111 mmol/L   CO2 25 22 - 32 mmol/L   Glucose, Bld 178 (H) 70 - 99 mg/dL   BUN 11 6 - 20 mg/dL   Creatinine, Ser 0.64 0.61 - 1.24 mg/dL   Calcium 9.1 8.9 - 10.3 mg/dL   Total Protein 7.5 6.5 - 8.1 g/dL   Albumin 3.5 3.5 - 5.0 g/dL   AST 16 15 - 41 U/L   ALT 19 0 - 44 U/L   Alkaline Phosphatase 97 38 - 126 U/L   Total Bilirubin 0.4 0.3 - 1.2 mg/dL   GFR calc non Af Amer >60 >60 mL/min   GFR calc Af Amer >60 >60 mL/min   Anion gap 9 5 - 15  Lipid panel  Result Value Ref Range   Cholesterol 142 0 - 200 mg/dL   Triglycerides 97 <150 mg/dL   HDL 41 >40 mg/dL   Total CHOL/HDL Ratio 3.5 RATIO   VLDL 19 0 - 40 mg/dL   LDL Cholesterol 82 0 - 99 mg/dL  PSA  Result Value Ref Range   Prostatic Specific Antigen 0.16 0.00 - 4.00 ng/mL      Assessment & Plan:    1. Uncontrolled diabetes  Discussed referral to endocrinologist but pt declines.  Will increase lantus to 40units qhs.  He is to continue the metformin, [place] and novolog ssi.   Discussed that he needs to monitor his blood sugars regularly so as to make glycemic control more attainable.  He is reminded to call office for fbs < 70 or > 300.  Pt is to make sure that he has his bs log with him at follow up appointment in 1 month so adjustments can be made.   Diabetic foot exam updated  Will refer for annual diabetic eye exam  2. Hypertension Pt is to continue current lisinopril and metoprolol for this  3. Hyperlipidemia Lipids well controlled.  Pt to continue lipitor and he is encouraged to follow lowfat  diet  4. Eating disorder -encouraged pt to seek counseling.  Have recommended he do this in the past and again encouraged him to do this.  Discussed that he make a list of other activities to do when he feels he is eating and is not hungrgy.    5. Cellulitis versus early diabetic ulcer   Pt cannot see the area where his infection is located.  Showed the pt how to use a handheld mirror so that he ccan look at the area and urged him to look at it every day.  Discussed that he should be using a mirror to look at his feet daily as well.     Will start pt on septra ds bid x 10 days.  He is to keep an eye on the area and notify office for any worsening.  Pt requested to go to wound clinic for this but discussed taht there is no open wound so the wound clinic would not be willing to see him at this time.    6. Chronic knee pain  Discussed that he is going to orthopedics for OA of knees.  Discussed that weight reduction is only long term treatment that will help his knees as he would not have good success with TKA at his current weight.  He is understanding.    7. Morbid obesity Discussed gradually increasing exercise and eating slightly smaller portions and more nutritious foods.  Discussed taht best chances for weight loss include both increased use of calories and decreased intake.  Also, as stated about , counseling to help his eating disorder.     8. Healthcare maintenance  Pt was given iFOBT for colon cancer screening.  Spent additional time discussing strategies with pt to help achieve success as he had problems the last time he had one of these tests.     Over 40 minutes was spent with pt including over half with education and counseling.   F/u 1 mo- ev- bs log-no labs

## 2019-07-17 ENCOUNTER — Telehealth: Payer: Self-pay | Admitting: Orthopaedic Surgery

## 2019-07-17 MED ORDER — OXYCODONE-ACETAMINOPHEN 10-325 MG PO TABS: 1.0000 | ORAL_TABLET | ORAL | 0 refills | Status: DC | PRN

## 2019-07-17 NOTE — Telephone Encounter (Signed)
Patient requests refill: oxyCODONE-acetaminophen (PERCOCET) 10-325 MG tablet 170 tablet   (aware last refill 06/19/19) / pharmacy: [facility]

## 2019-08-08 ENCOUNTER — Other Ambulatory Visit: Payer: Self-pay

## 2019-08-08 DIAGNOSIS — Z20822 Contact with and (suspected) exposure to covid-19: Secondary | ICD-10-CM

## 2019-08-09 ENCOUNTER — Ambulatory Visit: Payer: Self-pay | Admitting: Physician Assistant

## 2019-08-09 ENCOUNTER — Encounter: Payer: Self-pay | Admitting: Physician Assistant

## 2019-08-09 DIAGNOSIS — Z20822 Contact with and (suspected) exposure to covid-19: Secondary | ICD-10-CM

## 2019-08-09 DIAGNOSIS — E785 Hyperlipidemia, unspecified: Secondary | ICD-10-CM

## 2019-08-09 DIAGNOSIS — Z20828 Contact with and (suspected) exposure to other viral communicable diseases: Secondary | ICD-10-CM

## 2019-08-09 DIAGNOSIS — I10 Essential (primary) hypertension: Secondary | ICD-10-CM

## 2019-08-09 DIAGNOSIS — E1165 Type 2 diabetes mellitus with hyperglycemia: Secondary | ICD-10-CM

## 2019-08-09 NOTE — Progress Notes (Signed)
There were no vitals taken for this visit.   Subjective:    Patient ID: [name], male    DOB: November 03, 1965, 53 y.o.   MRN: [id number]  HPI: [name] is a 53 y.o. male presenting on 08/09/2019 for No chief complaint on file.   HPI  This is a telemedicine visit due to coronavirus pandemic.  It is via Telephone as pt does not have a video enabled device  I connected with  [name] on 08/10/19 by a video enabled telemedicine application and verified that I am speaking with the correct person using two identifiers.   I discussed the limitations of evaluation and management by telemedicine. The patient expressed understanding and agreed to proceed.  Pt is at home.  Provider is at office.   Pt's insulin was adjusted at previous OV and appointment today is to review blood sugars since those adjustments.  He is currently Using 40u lantus and ssi in addition to [place] and metformin.  fbs   Today 148, 11/30- 128, 11/29- 185, 11/27-162  Lowest- 122, highest- 30  Pt says he Got testest today for covid 19 because he was around people at thanksgiving- he is currently having no symptoms   Relevant past medical, surgical, family and social history reviewed and updated as indicated. Interim medical history since our last visit reviewed. Allergies and medications reviewed and updated.   Current Outpatient Medications:  .  atorvastatin (LIPITOR) 20 MG tablet, Take 1 tablet (20 mg total) by mouth daily., Disp: 90 tablet, Rfl: 1 .  insulin aspart (NOVOLOG) 100 UNIT/ML injection, Per ssi, Disp: 5 mL, Rfl: 0 .  insulin glargine (LANTUS) 100 UNIT/ML injection, Inject 0.4 mLs (40 Units total) into the skin at bedtime., Disp: 10 mL, Rfl: 0 .  lisinopril (ZESTRIL) 20 MG tablet, Take 1 tablet (20 mg total) by mouth daily., Disp: 90 tablet, Rfl: 1 .  metFORMIN (GLUCOPHAGE) 500 MG tablet, Take 1 tablet (500 mg total) by mouth 2 (two) times daily with a meal., Disp: 180 tablet, Rfl: 1 .   metoprolol tartrate (LOPRESSOR) 50 MG tablet, Take 1 tablet (50 mg total) by mouth 2 (two) times daily., Disp: 180 tablet, Rfl: 1 .  oxyCODONE-acetaminophen (PERCOCET) 10-325 MG tablet, Take 1 tablet by mouth every 4 (four) hours as needed for pain., Disp: 170 tablet, Rfl: 0 .  sitaGLIPtin (JANUVIA) 100 MG tablet, Take 1 tablet (100 mg total) by mouth daily., Disp: 90 tablet, Rfl: 0    Review of Systems  Per HPI unless specifically indicated above     Objective:    There were no vitals taken for this visit.  Wt Readings from Last 3 Encounters:  07/05/19 (!) 420 lb (190.5 kg)  11/08/18 (!) 413 lb (187.3 kg)  10/17/18 (!) 413 lb 8 oz (187.6 kg)    Physical Exam Pulmonary:     Effort: No respiratory distress.  Neurological:     Mental Status: He is alert and oriented to person, place, and time.  Psychiatric:        Attention and Perception: Attention normal.        Speech: Speech normal.        Behavior: Behavior is cooperative.           Assessment & Plan:     Encounter Diagnoses  Name Primary?  [name] Uncontrolled type 2 diabetes mellitus with hyperglycemia ([place]) Yes  . Hyperlipidemia, unspecified hyperlipidemia type   . Essential hypertension   . Encounter  for screening laboratory testing for COVID-19 virus in asymptomatic patient      -Pt will continue his metformin and januvia.  He is to Increase to 45u lantus daily and Continue the sliding scale insulin with the novolog.  He is to continue to monitor his blood sugars.  He is reminded to notify office for fbs < 70 or > 300.  He is encouraged to follow diabetic diet -pt is counseled on self isolation until he gets his covid results.  Discussed that gatherings are dangerous as they increase the risk of contracting covid.  Encouraged pt to avoid gatherings with those he does not live with and to always wear a mask when he goes out.  All questions are answered -pt to follow up in 2 months.  He is to contact office sooner  prn

## 2019-08-11 ENCOUNTER — Telehealth: Payer: Self-pay | Admitting: Physician Assistant

## 2019-08-11 LAB — NOVEL CORONAVIRUS, NAA: SARS-CoV-2, NAA: NOT DETECTED

## 2019-08-11 NOTE — Telephone Encounter (Signed)
Pt was called and given negative covid results.  He is asymptomatic and had gotten tested for screening.  He is encouraged to wear a mask whenever he leaves his house and to avoid social gatherings.  All patients questions were answere

## 2019-08-14 ENCOUNTER — Other Ambulatory Visit: Payer: Self-pay | Admitting: Orthopaedic Surgery

## 2019-08-14 MED ORDER — OXYCODONE-ACETAMINOPHEN 10-325 MG PO TABS: 1.0000 | ORAL_TABLET | ORAL | 0 refills | Status: DC | PRN

## 2019-08-14 NOTE — Telephone Encounter (Signed)
Patient requests refill  oxyCODONE-acetaminophen (PERCOCET) 10-325 MG tablet 170 tablet  - [facility] requests [facility], 

## 2019-08-14 NOTE — Telephone Encounter (Signed)
Rx refill request.  [facility] [place]

## 2019-08-14 NOTE — Telephone Encounter (Signed)
Pharmacy changed on refill request

## 2019-09-13 ENCOUNTER — Other Ambulatory Visit: Payer: Self-pay

## 2019-09-13 ENCOUNTER — Encounter: Payer: Self-pay | Admitting: Orthopaedic Surgery

## 2019-09-13 ENCOUNTER — Ambulatory Visit: Payer: Self-pay | Admitting: Orthopaedic Surgery

## 2019-09-13 VITALS — BP 147/85 | HR 101 | Temp 97.3°F | Ht 68.0 in | Wt >= 6400 oz

## 2019-09-13 DIAGNOSIS — G8929 Other chronic pain: Secondary | ICD-10-CM

## 2019-09-13 DIAGNOSIS — M25562 Pain in left knee: Secondary | ICD-10-CM

## 2019-09-13 DIAGNOSIS — M25561 Pain in right knee: Secondary | ICD-10-CM

## 2019-09-13 DIAGNOSIS — Z6841 Body Mass Index (BMI) 40.0 and over, adult: Secondary | ICD-10-CM

## 2019-09-13 MED ORDER — OXYCODONE-ACETAMINOPHEN 10-325 MG PO TABS: 1.0000 | ORAL_TABLET | ORAL | 0 refills | Status: DC | PRN

## 2019-09-13 NOTE — Progress Notes (Signed)
Patient [id number] [name], male DOB:[birth year], 54 y.o. [id number]  Chief Complaint  Patient presents with  . Knee Pain    Bilat knee pain    HPI  [name] is a 54 y.o. male who has bilateral knee pain. He has no new trauma.  He has swelling and popping but no giving way.  He has been taking his medicine.  He has no redness.   Body mass index is 63.86 kg/m.  The patient meets the AMA guidelines for Morbid (severe) obesity with a BMI > 40.0 and I have recommended weight loss.   ROS  Review of Systems  Constitutional: Positive for activity change.  HENT: Negative for congestion.   Respiratory: Positive for shortness of breath. Negative for cough.   Cardiovascular: Positive for leg swelling. Negative for chest pain.  Endocrine: Negative for cold intolerance.  Musculoskeletal: Positive for arthralgias, back pain, gait problem, joint swelling and myalgias.  Allergic/Immunologic: Positive for environmental allergies.  Neurological: Negative for numbness.  All other systems reviewed and are negative.   All other systems reviewed and are negative.  The following is a summary of the past history medically, past history surgically, known current medicines, social history and family history.  This information is gathered electronically by the computer from prior information and documentation.  I review this each visit and have found including this information at this point in the chart is beneficial and informative.    Past Medical History:  Diagnosis Date  . Arthritis   . Diabetes mellitus without complication ([place])    diagnosed about age 38  . Gout   . Hypertension    board[place]line    Past Surgical History:  Procedure Laterality Date  . HERNIA REPAIR  2008  . MASS EXCISION Left age 3   L thigh- benign    Family History  Problem Relation Age of Onset  . Cancer Mother   . Diabetes Mother   . Heart failure Father   . Hypertension Father   . Diabetes Father      Social History Social History   Tobacco Use  . Smoking status: Former Smoker    Packs/day: 0.50    Years: 5.00    Pack years: 2.50    Types: Cigarettes    Quit date: 1990    Years since quitting: 31.0  . Smokeless tobacco: Never Used  Substance Use Topics  . Alcohol use: No  . Drug use: No    No Known Allergies  Current Outpatient Medications  Medication Sig Dispense Refill  . atorvastatin (LIPITOR) 20 MG tablet Take 1 tablet (20 mg total) by mouth daily. 90 tablet 1  . insulin aspart (NOVOLOG) 100 UNIT/ML injection Per ssi 5 mL 0  . insulin glargine (LANTUS) 100 UNIT/ML injection Inject 0.4 mLs (40 Units total) into the skin at bedtime. 10 mL 0  . lisinopril (ZESTRIL) 20 MG tablet Take 1 tablet (20 mg total) by mouth daily. 90 tablet 1  . metFORMIN (GLUCOPHAGE) 500 MG tablet Take 1 tablet (500 mg total) by mouth 2 (two) times daily with a meal. 180 tablet 1  . metoprolol tartrate (LOPRESSOR) 50 MG tablet Take 1 tablet (50 mg total) by mouth 2 (two) times daily. 180 tablet 1  . oxyCODONE-acetaminophen (PERCOCET) 10-325 MG tablet Take 1 tablet by mouth every 4 (four) hours as needed for pain. 170 tablet 0  . sitaGLIPtin (JANUVIA) 100 MG tablet Take 1 tablet (100 mg total) by mouth daily. 90 tablet 0  No current facility-administered medications for this visit.     Physical Exam  Blood pressure (!) 147/85, pulse (!) 101, temperature (!) 97.3 F (36.3 C), height 5\' 8"  (1.727 m), weight (!) 420 lb (190.5 kg).  Constitutional: overall normal hygiene, normal nutrition, well developed, normal grooming, normal body habitus. Assistive device:none  Musculoskeletal: gait and station Limp right, muscle tone and strength are normal, no tremors or atrophy is present.  .  Neurological: coordination overall normal.  Deep tendon reflex/nerve stretch intact.  Sensation normal.  Cranial nerves II-XII intact.   Skin:   Normal overall no scars, lesions, ulcers or rashes. No  psoriasis.  Psychiatric: Alert and oriented x 3.  Recent memory intact, remote memory unclear.  Normal mood and affect. Well groomed.  Good eye contact.  Cardiovascular: overall no swelling, no varicosities, no edema bilaterally, normal temperatures of the legs and arms, no clubbing, cyanosis and good capillary refill.  Both knees are tender.  He has a waddling gait.  ROM of both are 0 to 95 with pain and crepitus.    Lymphatic: palpation is normal.  All other systems reviewed and are negative   The patient has been educated about the nature of the problem(s) and counseled on treatment options.  The patient appeared to understand what I have discussed and is in agreement with it.  The patient meets the AMA guidelines for Morbid (severe) obesity with a BMI > 40.0 and I have recommended weight loss.  Encounter Diagnoses  Name Primary?  . Chronic pain of right knee Yes  . Chronic pain of left knee   . Morbid obesity due to excess calories ([place])   . Body mass index 60.0-69.9, adult [facility])     PLAN Call if any problems.  Precautions discussed.  Continue current medications.   Return to clinic 3 months   I have reviewed the [place] web site prior to prescribing narcotic medicine for this patient.   Electronically Signed Sanjuana Kava, MD 1/7/20218:58 AM

## 2019-09-19 ENCOUNTER — Ambulatory Visit: Payer: Self-pay | Admitting: Orthopaedic Surgery

## 2019-10-03 ENCOUNTER — Ambulatory Visit: Payer: Self-pay | Admitting: Physician Assistant

## 2019-10-05 ENCOUNTER — Other Ambulatory Visit: Payer: Self-pay

## 2019-10-05 ENCOUNTER — Other Ambulatory Visit (HOSPITAL_COMMUNITY)
Admission: RE | Admit: 2019-10-05 | Discharge: 2019-10-05 | Disposition: A | Discharge status: Home / Self Care | Payer: Self-pay | Source: Ambulatory Visit | Attending: Physician Assistant | Admitting: Physician Assistant

## 2019-10-05 DIAGNOSIS — E785 Hyperlipidemia, unspecified: Secondary | ICD-10-CM | POA: Insufficient documentation

## 2019-10-05 DIAGNOSIS — E1165 Type 2 diabetes mellitus with hyperglycemia: Secondary | ICD-10-CM | POA: Insufficient documentation

## 2019-10-05 DIAGNOSIS — I10 Essential (primary) hypertension: Secondary | ICD-10-CM | POA: Insufficient documentation

## 2019-10-05 LAB — LIPID PANEL
Cholesterol: 200 mg/dL (ref 0–200)
HDL: 42 mg/dL (ref >40)
LDL Cholesterol: 134 mg/dL — ABNORMAL HIGH (ref 0–99)
Total CHOL/HDL Ratio: 4.8 RATIO
Triglycerides: 119 mg/dL (ref <150)
VLDL: 24 mg/dL (ref 0–40)

## 2019-10-05 LAB — COMPREHENSIVE METABOLIC PANEL
ALT: 24 U/L (ref 0–44)
AST: 18 U/L (ref 15–41)
Albumin: 3.4 g/dL — ABNORMAL LOW (ref 3.5–5.0)
Alkaline Phosphatase: 95 U/L (ref 38–126)
Anion gap: 9 (ref 5–15)
BUN: 12 mg/dL (ref 6–20)
CO2: 27 mmol/L (ref 22–32)
Calcium: 9.1 mg/dL (ref 8.9–10.3)
Chloride: 100 mmol/L (ref 98–111)
Creatinine, Ser: 0.75 mg/dL (ref 0.61–1.24)
GFR calc Af Amer: >60 mL/min (ref >60)
GFR calc non Af Amer: >60 mL/min (ref >60)
Glucose, Bld: 276 mg/dL — ABNORMAL HIGH (ref 70–99)
Potassium: 4.4 mmol/L (ref 3.5–5.1)
Sodium: 136 mmol/L (ref 135–145)
Total Bilirubin: 0.9 mg/dL (ref 0.3–1.2)
Total Protein: 7.7 g/dL (ref 6.5–8.1)

## 2019-10-05 LAB — HEMOGLOBIN A1C
Hgb A1c MFr Bld: 10.9 % — ABNORMAL HIGH (ref 4.8–5.6)
Mean Plasma Glucose: 266.13 mg/dL

## 2019-10-06 LAB — MICROALBUMIN, URINE: Microalb, Ur: 13.3 ug/mL — ABNORMAL HIGH

## 2019-10-08 ENCOUNTER — Other Ambulatory Visit: Payer: Self-pay

## 2019-10-08 ENCOUNTER — Ambulatory Visit: Payer: Self-pay | Admitting: Physician Assistant

## 2019-10-08 ENCOUNTER — Encounter: Payer: Self-pay | Admitting: Physician Assistant

## 2019-10-08 VITALS — BP 150/84 | HR 96 | Temp 96.1°F

## 2019-10-08 DIAGNOSIS — F419 Anxiety disorder, unspecified: Secondary | ICD-10-CM

## 2019-10-08 DIAGNOSIS — L089 Local infection of the skin and subcutaneous tissue, unspecified: Secondary | ICD-10-CM

## 2019-10-08 DIAGNOSIS — E785 Hyperlipidemia, unspecified: Secondary | ICD-10-CM

## 2019-10-08 DIAGNOSIS — I10 Essential (primary) hypertension: Secondary | ICD-10-CM

## 2019-10-08 DIAGNOSIS — F509 Eating disorder, unspecified: Secondary | ICD-10-CM

## 2019-10-08 DIAGNOSIS — Z6841 Body Mass Index (BMI) 40.0 and over, adult: Secondary | ICD-10-CM

## 2019-10-08 DIAGNOSIS — E1165 Type 2 diabetes mellitus with hyperglycemia: Secondary | ICD-10-CM

## 2019-10-08 MED ORDER — LISINOPRIL 20 MG PO TABS: 20.0000 mg | ORAL_TABLET | Freq: Every day | ORAL | 1 refills | Status: DC

## 2019-10-08 MED ORDER — ATORVASTATIN CALCIUM 20 MG PO TABS: 20.0000 mg | ORAL_TABLET | Freq: Every day | ORAL | 1 refills | Status: DC

## 2019-10-08 MED ORDER — CEPHALEXIN 500 MG PO CAPS: 500.0000 mg | ORAL_CAPSULE | Freq: Three times a day (TID) | ORAL | 0 refills | Status: AC

## 2019-10-08 MED ORDER — METFORMIN HCL 500 MG PO TABS: 500.0000 mg | ORAL_TABLET | Freq: Two times a day (BID) | ORAL | 1 refills | Status: DC

## 2019-10-08 MED ORDER — INSULIN GLARGINE 100 UNIT/ML ~~LOC~~ SOLN: 50.0000 [IU] | Freq: Every day | SUBCUTANEOUS | 0 refills | Status: DC

## 2019-10-08 MED ORDER — METOPROLOL TARTRATE 100 MG PO TABS: 100.0000 mg | ORAL_TABLET | Freq: Two times a day (BID) | ORAL | 0 refills | Status: DC

## 2019-10-08 MED ORDER — SITAGLIPTIN PHOSPHATE 100 MG PO TABS: 100.0000 mg | ORAL_TABLET | Freq: Every day | ORAL | 0 refills | Status: DC

## 2019-10-08 NOTE — Progress Notes (Signed)
BP (!) 150/84   Pulse 96   Temp (!) 96.1 F (35.6 C)   SpO2 96%    Subjective:    Patient ID: [name], male    DOB: [birth year], 54 y.o.   MRN: [id number]  HPI: [name] is a 54 y.o. male presenting on 10/08/2019 for Leg Problem and Diabetes   HPI   Pt had a negative covid-19 screening questionnaire.    Pt is 46 yoM with uncontrolled diabetes, eating disorder and super-super-morbid obesity (BMI > 60).     Pt Didn't bring meds so no was to confirm what he's taking.    He States lots of anxiety.   Denies depression, SI, HI.  He Reports poor eating habits.   He knows that he has an eating disorder but doesn't feel able to control it.  He is Using Lantus 35u and ssi with novolog  He doesn't know why he is out of his cholesterol medication but shays he's been out of it for at least a month.   Pt is concerned about a scab on his RLE.  He says it has been there for some time but he thinks it's getting worse.   Pt is unable to really see the area well due to his size.      Relevant past medical, surgical, family and social history reviewed and updated as indicated. Interim medical history since our last visit reviewed. Allergies and medications reviewed and updated.   Current Outpatient Medications:  .  insulin aspart (NOVOLOG) 100 UNIT/ML injection, Per ssi (Patient taking differently: Inject 2-12 Units into the skin 3 (three) times daily with meals. Per ssi), Disp: 5 mL, Rfl: 0 .  insulin glargine (LANTUS) 100 UNIT/ML injection, Inject 0.4 mLs (40 Units total) into the skin at bedtime. (Patient taking differently: Inject 35 Units into the skin at bedtime. ), Disp: 10 mL, Rfl: 0 .  lisinopril (ZESTRIL) 20 MG tablet, Take 1 tablet (20 mg total) by mouth daily., Disp: 90 tablet, Rfl: 1 .  metFORMIN (GLUCOPHAGE) 500 MG tablet, Take 1 tablet (500 mg total) by mouth 2 (two) times daily with a meal. (Patient taking differently: Take 500 mg by mouth daily. ), Disp: 180 tablet,  Rfl: 1 .  metoprolol tartrate (LOPRESSOR) 50 MG tablet, Take 1 tablet (50 mg total) by mouth 2 (two) times daily. (Patient taking differently: Take 25 mg by mouth daily. ), Disp: 180 tablet, Rfl: 1 .  oxyCODONE-acetaminophen (PERCOCET) 10-325 MG tablet, Take 1 tablet by mouth every 4 (four) hours as needed for pain., Disp: 170 tablet, Rfl: 0 .  sitaGLIPtin (JANUVIA) 100 MG tablet, Take 1 tablet (100 mg total) by mouth daily., Disp: 90 tablet, Rfl: 0 .  atorvastatin (LIPITOR) 20 MG tablet, Take 1 tablet (20 mg total) by mouth daily. (Patient not taking: Reported on 10/08/2019), Disp: 90 tablet, Rfl: 1     Review of Systems  Per HPI unless specifically indicated above     Objective:    BP (!) 150/84   Pulse 96   Temp (!) 96.1 F (35.6 C)   SpO2 96%   Wt Readings from Last 3 Encounters:  09/13/19 (!) 420 lb (190.5 kg)  07/05/19 (!) 420 lb (190.5 kg)  11/08/18 (!) 413 lb (187.3 kg)    Physical Exam Vitals reviewed.  Constitutional:      General: He is not in acute distress.    Appearance: He is well-developed. He is obese. He is not toxic-appearing.  HENT:     Head: Normocephalic and atraumatic.  Cardiovascular:     Rate and Rhythm: Normal rate and regular rhythm.  Pulmonary:     Effort: Pulmonary effort is normal. No respiratory distress.     Breath sounds: Normal breath sounds. No wheezing.  Abdominal:     General: Bowel sounds are normal.     Palpations: Abdomen is soft.     Tenderness: There is no abdominal tenderness.  Musculoskeletal:     Cervical back: Neck supple.     Right lower leg: Edema present.     Left lower leg: Edema present.       Legs:     Comments: Mild BLE edema.  No pitting.  No redness. 2 Very thick scabs on lateral RLE, each about nickel sized.  No redness but when pressed, pus oozes out from around the edges of the scab.  There is no fluctuance or abscess.   Feet:     Right foot:     Skin integrity: Callus and dry skin present. No ulcer, blister,  skin breakdown or erythema.     Left foot:     Skin integrity: Callus and dry skin present. No ulcer, blister, skin breakdown or erythema.  Lymphadenopathy:     Cervical: No cervical adenopathy.  Skin:    General: Skin is warm and dry.  Neurological:     Mental Status: He is alert and oriented to person, place, and time.  Psychiatric:        Attention and Perception: Attention normal.        Mood and Affect: Mood normal.        Speech: Speech normal.        Behavior: Behavior normal. Behavior is cooperative.     Comments: Very pleasant.  Cooperative.  Eager to get better.           Results for orders placed or performed during the hospital encounter of 10/05/19  Microalbumin, urine  Result Value Ref Range   Microalb, Ur 13.3 (H) Not Estab. ug/mL  Lipid panel  Result Value Ref Range   Cholesterol 200 0 - 200 mg/dL   Triglycerides 119 <150 mg/dL   HDL 42 >40 mg/dL   Total CHOL/HDL Ratio 4.8 RATIO   VLDL 24 0 - 40 mg/dL   LDL Cholesterol 134 (H) 0 - 99 mg/dL  Comprehensive metabolic panel  Result Value Ref Range   Sodium 136 135 - 145 mmol/L   Potassium 4.4 3.5 - 5.1 mmol/L   Chloride 100 98 - 111 mmol/L   CO2 27 22 - 32 mmol/L   Glucose, Bld 276 (H) 70 - 99 mg/dL   BUN 12 6 - 20 mg/dL   Creatinine, Ser 0.75 0.61 - 1.24 mg/dL   Calcium 9.1 8.9 - 10.3 mg/dL   Total Protein 7.7 6.5 - 8.1 g/dL   Albumin 3.4 (L) 3.5 - 5.0 g/dL   AST 18 15 - 41 U/L   ALT 24 0 - 44 U/L   Alkaline Phosphatase 95 38 - 126 U/L   Total Bilirubin 0.9 0.3 - 1.2 mg/dL   GFR calc non Af Amer >60 >60 mL/min   GFR calc Af Amer >60 >60 mL/min   Anion gap 9 5 - 15  Hemoglobin A1c  Result Value Ref Range   Hgb A1c MFr Bld 10.9 (H) 4.8 - 5.6 %   Mean Plasma Glucose 266.13 mg/dL      Assessment & Plan:    Encounter  Diagnoses  Name Primary?  [name] Uncontrolled type 2 diabetes mellitus with hyperglycemia ([place]) Yes  . Hyperlipidemia, unspecified hyperlipidemia type   . Essential hypertension   .  Morbid obesity ([place])   . BMI 60.0-69.9, adult ([place])   . Eating disorder, unspecified type   . Anxiety   . Infected wound      -Reviewed labs with pt.   -Increase lantus to 50 u and cont ssi.  Cont metformin and januvia -Refer to endocrinology for uncontrolled DM -Pt reminded that he needs to bring all meds to every appt -Counseled pt to avoid running out of meds -Refer to RD for dietary assistance -Pt to contact [facility] for counseling, evaluation and treatment  for anxiety and eating disorder.  He is given contact information -rx keflex and counseled on wound care for the scab/wound on his RLE -Increase metoprolol for bp -pt to follow up 1 month-  (virtual appt for bp check and wound check).  Pt is to contact office sooner for any worsening or new issues -Will get care connect to check on financial assistance

## 2019-10-08 NOTE — Patient Instructions (Signed)
Eating Disorders Eating disorders are medical and psychological problems. They often have biological, psychological, and social causes. Depression, obsession with food, and a distorted body image are common in people who have eating disorders. Over time, eating disorders will damage the body and are likely to have a major impact on mood and mental health. The most common eating disorders are:  Bulimia nervosa. This is when you eat large amounts of food in a short period of time. This is often followed by getting rid of the calories that were eaten (purging) by vomiting, exercising excessively, or taking laxatives. Bulimia may start as a way to control weight. Later, it may be triggered by stress or an emotional crisis.  Anorexia nervosa. This is when you have an extremely low body weight from severe dieting or compulsive exercising or both. Losing weight or preventing weight gain becomes an obsession. Anorexia is often used as a way to cope with emotional problems.  Binge eating disorder (BED). This is when you eat an excessive amount of food in a short period of time of two hours or less, and you feel that you have lost control over your eating. This kind of experience is called a binge. People who have BED eat too quickly, feel uncomfortably full, eat when they are not hungry, and usually eat alone. Typically, a binge happens three or more times a week.  Other specified feeding or eating disorder. You may be diagnosed with this if you have some symptoms of BED, bulimia nervosa, or anorexia nervosa, but not enough symptoms to diagnose a specific disorder. Eating disorders can lead to serious medical problems. These may include:  Extreme malnutrition.  Hormone imbalance.  Vitamin and mineral deficiencies.  Organ damage.  Obesity and related medical conditions.  Damage to the teeth, jaw, and esophagus. What are the causes? Eating disorders are often associated with emotional or psychological  issues, including:  Depression.  Anxiety.  Low self-esteem.  Loneliness.  Shame.  Extreme self-judgment. What increases the risk? Eating disorders are more likely to develop in:  Women.  People under the age of 67.  People who have been teased about their weight. In addition, people who participate in endurance sports or sports where physical appearance is emphasized may be at greater risk of developing an eating disorder. What are the signs or symptoms? Symptoms of an eating disorder include:  An obsession with food and eating.  An obsession with body weight and appearance.  Not eating or barely eating. This can lead to vitamin and mineral deficiencies.  Binge eating.  Vomiting after eating.  Taking laxatives after eating.  Exercising too often.  Distorted body image.  Absence or loss of menstrual flow (amenorrhea), if this applies.  Self-esteem that is dependent on body image or weight. How is this diagnosed? This condition is diagnosed with a physical exam and a psychological evaluation. You may have blood tests, urine tests, or eating questionnaires. How is this treated? Treatment for an eating disorder may include:  Psychotherapy. This may also be called talk therapy or counseling.  Seeing a nutrition specialist (dietitian).  Getting appropriate exercise.  Medicines to help relieve anxiety or depression.  Hospitalization or referral to an eating disorders program. Follow these instructions at home: Lifestyle  Educate yourself and others about your eating disorder.  Identify situations that trigger your symptoms. Develop a plan to help you cope with these situations.  Resist weighing yourself or checking yourself in the mirror often.  Seek out programs or  resources that address eating disorders.  Talk with an eating disorder specialist, therapist, or counselor about your eating behavior. Activity  Follow instructions from your health care  provider about eating and exercising.  Return to your normal activities as told by your health care provider. Ask your health care provider what activities are safe for you. General instructions  Get regular dental care every six months.  Keep all follow-up visits as told by your health care provider. This is important.  Take over-the-counter and prescription medicines only as told by your health care provider. Contact a health care provider if:  You have sudden weight loss or gain related to your eating.  Your symptoms return.  You abuse stimulants or diet aids.  You have an irregular heartbeat.  You have a constant fear of gaining weight.  You take laxatives after you eat.  You have eating, dieting, or exercising habits that you cannot control.  You have irregular menstrual periods or you stop having menstrual periods, if this applies. Get help right away if:  You have blood or brown flecks (like coffee grounds) in your vomit.  You have bright red or black stools.  You have chest pain or pressure.  You have difficulty breathing.  You do not urinate every eight hours.  You have serious thoughts about hurting yourself or have plans to do that. This information is not intended to replace advice given to you by your health care provider. Make sure you discuss any questions you have with your health care provider. Document Revised: 01/02/2016 Document Reviewed: 06/27/2015 Elsevier Patient Education  2020 [employer].

## 2019-10-10 ENCOUNTER — Telehealth: Payer: Self-pay | Admitting: Orthopaedic Surgery

## 2019-10-10 MED ORDER — OXYCODONE-ACETAMINOPHEN 10-325 MG PO TABS: 1.0000 | ORAL_TABLET | ORAL | 0 refills | Status: DC | PRN

## 2019-10-10 NOTE — Telephone Encounter (Signed)
Patient requests refill:  oxyCODONE-acetaminophen (PERCOCET) 10-325 MG tablet 170 tablet  [facility] [place]

## 2019-10-12 ENCOUNTER — Telehealth: Payer: Self-pay

## 2019-10-23 ENCOUNTER — Ambulatory Visit (INDEPENDENT_AMBULATORY_CARE_PROVIDER_SITE_OTHER): Payer: Self-pay | Admitting: "Endocrinology

## 2019-10-23 ENCOUNTER — Encounter: Payer: Self-pay | Admitting: "Endocrinology

## 2019-10-23 ENCOUNTER — Other Ambulatory Visit: Payer: Self-pay

## 2019-10-23 VITALS — BP 131/81 | HR 76 | Ht 68.0 in | Wt >= 6400 oz

## 2019-10-23 DIAGNOSIS — I10 Essential (primary) hypertension: Secondary | ICD-10-CM

## 2019-10-23 DIAGNOSIS — E782 Mixed hyperlipidemia: Secondary | ICD-10-CM | POA: Insufficient documentation

## 2019-10-23 DIAGNOSIS — E1165 Type 2 diabetes mellitus with hyperglycemia: Secondary | ICD-10-CM

## 2019-10-23 MED ORDER — METFORMIN HCL 1000 MG PO TABS: 1000.0000 mg | ORAL_TABLET | Freq: Two times a day (BID) | ORAL | 1 refills | Status: DC

## 2019-10-23 MED ORDER — INSULIN ASPART 100 UNIT/ML ~~LOC~~ SOLN: 10.0000 [IU] | Freq: Three times a day (TID) | SUBCUTANEOUS | 2 refills | Status: DC

## 2019-10-23 MED ORDER — INSULIN GLARGINE 100 UNIT/ML ~~LOC~~ SOLN: 60.0000 [IU] | Freq: Every day | SUBCUTANEOUS | 2 refills | Status: DC

## 2019-10-23 NOTE — Progress Notes (Signed)
Endocrinology Consult Note       10/23/2019, 3:44 PM   Subjective:    Patient ID: [name], male    DOB: 06/21/1966.  [name] is being seen in consultation for management of currently uncontrolled symptomatic diabetes requested by  [name], PA-C.   Past Medical History:  Diagnosis Date  . Arthritis   . Diabetes mellitus without complication ([place])    diagnosed about age 54  . Gout   . Hypertension    boarderline    Past Surgical History:  Procedure Laterality Date  . HERNIA REPAIR  2008  . MASS EXCISION Left age 70   L thigh- benign    Social History   Socioeconomic History  . Marital status: Single    Spouse name: Not on file  . Number of children: Not on file  . Years of education: Not on file  . Highest education level: Not on file  Occupational History  . Not on file  Tobacco Use  . Smoking status: Former Smoker    Packs/day: 0.50    Years: 5.00    Pack years: 2.50    Types: Cigarettes    Quit date: 1990    Years since quitting: 31.1  . Smokeless tobacco: Never Used  Substance and Sexual Activity  . Alcohol use: No  . Drug use: No  . Sexual activity: Not on file  Other Topics Concern  . Not on file  Social History Narrative  . Not on file   Social Determinants of Health   Financial Resource Strain:   . Difficulty of Paying Living Expenses: Not on file  Food Insecurity:   . Worried About Charity fundraiser in the Last Year: Not on file  . Ran Out of Food in the Last Year: Not on file  Transportation Needs:   . Lack of Transportation (Medical): Not on file  . Lack of Transportation (Non-Medical): Not on file  Physical Activity:   . Days of Exercise per Week: Not on file  . Minutes of Exercise per Session: Not on file  Stress:   . Feeling of Stress : Not on file  Social Connections:   . Frequency of Communication with Friends and Family: Not on file   . Frequency of Social Gatherings with Friends and Family: Not on file  . Attends Religious Services: Not on file  . Active Member of Clubs or Organizations: Not on file  . Attends Archivist Meetings: Not on file  . Marital Status: Not on file    Family History  Problem Relation Age of Onset  . Cancer Mother   . Diabetes Mother   . Heart failure Father   . Hypertension Father   . Diabetes Father   . Heart attack Father     Outpatient Encounter Medications as of 10/23/2019  Medication Sig  . atorvastatin (LIPITOR) 20 MG tablet Take 1 tablet (20 mg total) by mouth daily.  . insulin aspart (NOVOLOG) 100 UNIT/ML injection Inject 10-16 Units into the skin 3 (three) times daily before meals. Per ssi  . insulin glargine (LANTUS) 100 UNIT/ML injection  Inject 0.6 mLs (60 Units total) into the skin at bedtime.  [name] lisinopril (ZESTRIL) 20 MG tablet Take 1 tablet (20 mg total) by mouth daily.  . metFORMIN (GLUCOPHAGE) 1000 MG tablet Take 1 tablet (1,000 mg total) by mouth 2 (two) times daily with a meal.  . metoprolol tartrate (LOPRESSOR) 100 MG tablet Take 1 tablet (100 mg total) by mouth 2 (two) times daily.  [name] oxyCODONE-acetaminophen (PERCOCET) 10-325 MG tablet Take 1 tablet by mouth every 4 (four) hours as needed for pain.  . sitaGLIPtin (JANUVIA) 100 MG tablet Take 1 tablet (100 mg total) by mouth daily.  . [DISCONTINUED] insulin aspart (NOVOLOG) 100 UNIT/ML injection Per ssi (Patient taking differently: Inject 2-12 Units into the skin 3 (three) times daily with meals. Per ssi)  . [DISCONTINUED] insulin glargine (LANTUS) 100 UNIT/ML injection Inject 0.5 mLs (50 Units total) into the skin at bedtime.  . [DISCONTINUED] metFORMIN (GLUCOPHAGE) 500 MG tablet Take 1 tablet (500 mg total) by mouth 2 (two) times daily with a meal.   No facility-administered encounter medications on file as of 10/23/2019.    ALLERGIES: No Known Allergies  VACCINATION STATUS: Immunization History   Administered Date(s) Administered  . Influenza,inj,Quad PF,6+ Mos 06/30/2017    Diabetes He presents for his initial diabetic visit. He has type 2 diabetes mellitus. Onset time: He was diagnosed at approximate age of 54 years. His disease course has been worsening. There are no hypoglycemic associated symptoms. Pertinent negatives for hypoglycemia include no confusion, headaches, pallor or seizures. Associated symptoms include polydipsia and polyuria. Pertinent negatives for diabetes include no chest pain, no fatigue, no polyphagia and no weakness. There are no hypoglycemic complications. Symptoms are worsening. There are no diabetic complications. Current diabetic treatments: He is currently on Lantus 50 units nightly, NovoLog 2-12 units 3 times daily AC, Metformin 1000 g p.o. twice daily, Januvia 100 mg daily. His weight is increasing steadily. He is following a generally unhealthy diet. When asked about meal planning, he reported none. He has not had a previous visit with a dietitian. He never participates in exercise. (He did not bring any logs nor meter to review.  His A1c was 10.9% in January 2021.) An ACE inhibitor/angiotensin II receptor blocker is being taken. Eye exam is current.  Hyperlipidemia This is a chronic problem. The current episode started more than 1 year ago. Exacerbating diseases include diabetes and obesity. Pertinent negatives include no chest pain, myalgias or shortness of breath. Current antihyperlipidemic treatment includes statins. Risk factors for coronary artery disease include dyslipidemia, diabetes mellitus, family history, hypertension, male sex, obesity and a sedentary lifestyle.  Hypertension This is a chronic problem. The current episode started more than 1 year ago. Pertinent negatives include no chest pain, headaches, neck pain, palpitations or shortness of breath. Risk factors for coronary artery disease include dyslipidemia, diabetes mellitus, male gender, obesity  and sedentary lifestyle. Past treatments include ACE inhibitors and beta blockers.     Review of Systems  Constitutional: Negative for chills, fatigue, fever and unexpected weight change.  HENT: Negative for dental problem, mouth sores and trouble swallowing.   Eyes: Negative for visual disturbance.  Respiratory: Negative for cough, choking, chest tightness, shortness of breath and wheezing.   Cardiovascular: Negative for chest pain, palpitations and leg swelling.  Gastrointestinal: Negative for abdominal distention, abdominal pain, constipation, diarrhea, nausea and vomiting.  Endocrine: Positive for polydipsia and polyuria. Negative for polyphagia.  Genitourinary: Negative for dysuria, flank pain, hematuria and urgency.  Musculoskeletal: Negative for back  pain, gait problem, myalgias and neck pain.  Skin: Negative for pallor, rash and wound.  Neurological: Negative for seizures, syncope, weakness, numbness and headaches.  Psychiatric/Behavioral: Negative for confusion and dysphoric mood.    Objective:    Vitals with BMI 10/23/2019 10/08/2019 09/13/2019  Height 5\' 8"  - 5\' 8"   Weight 414 lbs 6 oz - 420 lbs  BMI 0000000 - 0000000  Systolic [id number] [id number] Q000111Q  Diastolic 81 84 85  Pulse 76 96 101    BP 131/81   Pulse 76   Ht 5\' 8"  (1.727 m)   Wt (!) 414 lb 6.4 oz (188 kg)   BMI 63.01 kg/m   Wt Readings from Last 3 Encounters:  10/23/19 (!) 414 lb 6.4 oz (188 kg)  09/13/19 (!) 420 lb (190.5 kg)  07/05/19 (!) 420 lb (190.5 kg)     Physical Exam Constitutional:      General: He is not in acute distress.    Appearance: He is well-developed.  HENT:     Head: Normocephalic and atraumatic.  Neck:     Thyroid: No thyromegaly.     Trachea: No tracheal deviation.  Cardiovascular:     Rate and Rhythm: Normal rate.     Pulses:          Dorsalis pedis pulses are 1+ on the right side and 1+ on the left side.       Posterior tibial pulses are 1+ on the right side and 1+ on the left side.      Heart sounds: Normal heart sounds, S1 normal and S2 normal. No murmur. No gallop.   Pulmonary:     Effort: Pulmonary effort is normal. No respiratory distress.     Breath sounds: No wheezing.  Abdominal:     General: There is no distension.     Tenderness: There is no abdominal tenderness. There is no guarding.  Musculoskeletal:     Right shoulder: No swelling or deformity.     Cervical back: Normal range of motion and neck supple.  Skin:    General: Skin is warm and dry.     Findings: No rash.     Nails: There is no clubbing.  Neurological:     Mental Status: He is alert and oriented to person, place, and time.     Cranial Nerves: No cranial nerve deficit.     Sensory: No sensory deficit.     Gait: Gait normal.     Deep Tendon Reflexes: Reflexes are normal and symmetric.  Psychiatric:        Speech: Speech normal.        Behavior: Behavior normal. Behavior is cooperative.        Thought Content: Thought content normal.        Judgment: Judgment normal.      CMP ( most recent) CMP     Component Value Date/Time   NA 136 10/05/2019 1208   K 4.4 10/05/2019 1208   CL 100 10/05/2019 1208   CO2 27 10/05/2019 1208   GLUCOSE 276 (H) 10/05/2019 1208   BUN 12 10/05/2019 1208   CREATININE 0.75 10/05/2019 1208   CREATININE 1.42 (H) 09/08/2017 1540   CALCIUM 9.1 10/05/2019 1208   PROT 7.7 10/05/2019 1208   ALBUMIN 3.4 (L) 10/05/2019 1208   AST 18 10/05/2019 1208   ALT 24 10/05/2019 1208   ALKPHOS 95 10/05/2019 1208   BILITOT 0.9 10/05/2019 1208   GFRNONAA >60 10/05/2019 1208   GFRAA >60 10/05/2019  1208     Diabetic Labs (most recent): Lab Results  Component Value Date   HGBA1C 10.9 (H) 10/05/2019   HGBA1C 10.0 (H) 07/02/2019   HGBA1C 10.2 (H) 03/29/2019     Lipid Panel ( most recent) Lipid Panel     Component Value Date/Time   CHOL 200 10/05/2019 1208   TRIG 119 10/05/2019 1208   HDL 42 10/05/2019 1208   CHOLHDL 4.8 10/05/2019 1208   VLDL 24 10/05/2019 1208    LDLCALC 134 (H) 10/05/2019 1208      Lab Results  Component Value Date   TSH 0.532 06/28/2017       Assessment & Plan:   1. Uncontrolled type 2 diabetes mellitus with hyperglycemia ([place])  - [name] has currently uncontrolled symptomatic type 2 DM since  54 years of age,  with most recent A1c of 10.9 %. Recent labs reviewed. - I had a long discussion with him about the progressive nature of diabetes and the pathology behind its complications. -his diabetes is complicated by obesity/sedentary life, inadequate insurance coverage,  And he remains at a high risk for more acute and chronic complications which include CAD, CVA, CKD, retinopathy, and neuropathy. These are all discussed in detail with him.  - I have counseled him on diet  and weight management  by adopting a carbohydrate restricted/protein rich diet. Patient is encouraged to switch to  unprocessed or minimally processed     complex starch and increased protein intake (animal or plant source), fruits, and vegetables. -  he is advised to stick to a routine mealtimes to eat 3 meals  a day and avoid unnecessary snacks ( to snack only to correct hypoglycemia).   - he admits that there is a room for improvement in his food and drink choices. - Suggestion is made for him to avoid simple carbohydrates  from his diet including Cakes, Sweet Desserts, Ice Cream, Soda (diet and regular), Sweet Tea, Candies, Chips, Cookies, Store Bought Juices, Alcohol in Excess of  1-2 drinks a day, Artificial Sweeteners,  Coffee Creamer, and "Sugar-free" Products. This will help patient to have more stable blood glucose profile and potentially avoid unintended weight gain.  - he will be scheduled with Jearld Fenton, RDN, CDE for diabetes education.  - I have approached him with the following individualized plan to manage  his diabetes and patient agrees:   -Given his significant glycemic burden, he will continue to need intensive treatment with  basal/bolus insulin in order for him to achieve control of diabetes to target. -Accordingly, he is advised to increase his Lantus to 60 units daily at bedtime , and increase his prandial insulin NovoLog to 10  units 3 times a day with meals  for pre-meal BG readings of 90-150mg /dl, plus patient specific correction dose for unexpected hyperglycemia above 150mg /dl, associated with strict monitoring of glucose 4 times a day-before meals and at bedtime. - he is warned not to take insulin without proper monitoring per orders. - Adjustment parameters are given to him for hypo and hyperglycemia in writing. - he is encouraged to call clinic for blood glucose levels less than 70 or above 300 mg /dl. - he is advised to continue Metformin 1000 mg p.o. twice daily, therapeutically suitable for patient . -He is also advised to continue Januvia 100 mg p.o. daily at breakfast.  - he will be considered for GLP-1 receptor agonist instead of DPP 4 inhibitor  as appropriate next visit.  - Specific targets for  A1c;  LDL, HDL,  and Triglycerides were discussed with the patient.  2) Blood Pressure /Hypertension:  his blood pressure is  controlled to target.   he is advised to continue his current medications including lisinopril 20 mg p.o. daily with breakfast . 3) Lipids/Hyperlipidemia:   Review of his recent lipid panel showed  controlled  LDL at 134 .  he  is advised to continue    atorvastatin 20 mg daily at bedtime.  Side effects and precautions discussed with him.  4)  Weight/Diet:  Body mass index is 63.01 kg/m.  -   clearly complicating his diabetes care.   he is  a candidate for modest weight loss. I discussed with him the fact that loss of 5 - 10% of his  current body weight will have the most impact on his diabetes management.  Exercise, and detailed carbohydrates information provided  -  detailed on discharge instructions.  He is a good candidate for bariatric surgery, however at this point he has no  insurance.  5) Chronic Care/Health Maintenance:  -he  is on ACEI/ARB and Statin medications and  is encouraged to initiate and continue to follow up with Ophthalmology, Dentist,  Podiatrist at least yearly or according to recommendations, and advised to   stay away from smoking. I have recommended yearly flu vaccine and pneumonia vaccine at least every 5 years; moderate intensity exercise for up to 150 minutes weekly; and  sleep for at least 7 hours a day.  - he is  advised to maintain close follow up with [name], PA-C for primary care needs, as well as his other providers for optimal and coordinated care.   - Time spent in this patient care: 60 min, of which > 50% was spent in  counseling  him about his currently uncontrolled type 2 diabetes, obesity, hyperlipidemia, hypertension and the rest reviewing his blood glucose logs , discussing his hypoglycemia and hyperglycemia episodes, reviewing his current and  previous labs / studies  ( including abstraction from other facilities) and medications  doses and developing a  long term treatment plan based on the latest standards of care/ guidelines; and documenting his care.    Please refer to Patient Instructions for Blood Glucose Monitoring and Insulin/Medications Dosing Guide"  in media tab for additional information. Please  also refer to " Patient Self Inventory" in the Media  tab for reviewed elements of pertinent patient history.  [name] participated in the discussions, expressed understanding, and voiced agreement with the above plans.  All questions were answered to his satisfaction. he is encouraged to contact clinic should he have any questions or concerns prior to his return visit.   Follow up plan: - Return in about 10 days (around 11/02/2019), or office, for Follow up with Meter and Logs Only - no Labs.  Glade Lloyd, MD [facility] [address] [place], [place]  [address] Phone: [phone]  Fax: [phone]    10/23/2019, 3:44 PM  This note was partially dictated with voice recognition software. Similar sounding words can be transcribed inadequately or may not  be corrected upon review.

## 2019-10-23 NOTE — Patient Instructions (Signed)

## 2019-10-31 ENCOUNTER — Ambulatory Visit: Payer: Self-pay | Admitting: Nutrition

## 2019-11-05 ENCOUNTER — Encounter: Payer: Self-pay | Admitting: "Endocrinology

## 2019-11-05 ENCOUNTER — Ambulatory Visit: Payer: Self-pay | Admitting: Physician Assistant

## 2019-11-05 ENCOUNTER — Encounter: Payer: Self-pay | Admitting: Physician Assistant

## 2019-11-05 ENCOUNTER — Ambulatory Visit (INDEPENDENT_AMBULATORY_CARE_PROVIDER_SITE_OTHER): Payer: Self-pay | Admitting: "Endocrinology

## 2019-11-05 VITALS — BP 150/82

## 2019-11-05 DIAGNOSIS — E782 Mixed hyperlipidemia: Secondary | ICD-10-CM

## 2019-11-05 DIAGNOSIS — E1165 Type 2 diabetes mellitus with hyperglycemia: Secondary | ICD-10-CM

## 2019-11-05 DIAGNOSIS — E785 Hyperlipidemia, unspecified: Secondary | ICD-10-CM

## 2019-11-05 DIAGNOSIS — I10 Essential (primary) hypertension: Secondary | ICD-10-CM

## 2019-11-05 MED ORDER — LISINOPRIL 20 MG PO TABS: 40.0000 mg | ORAL_TABLET | Freq: Every day | ORAL | 1 refills | Status: DC

## 2019-11-05 NOTE — Patient Instructions (Signed)

## 2019-11-05 NOTE — Progress Notes (Addendum)
11/05/2019, 1:19 PM                                                    Endocrinology Telehealth Visit Follow up Note -During COVID -19 Pandemic  This visit type was conducted due to national recommendations for restrictions regarding the COVID-19 Pandemic  in an effort to limit this patient's exposure and mitigate transmission of the corona virus.  Due to his co-morbid illnesses, [name] is at  moderate to high risk for complications without adequate follow up.  This format is felt to be most appropriate for him at this time.  I connected with this patient on 11/05/2019   by telephone and verified that I am speaking with the correct person using two identifiers. [name], 1966/07/14. he has verbally consented to this visit. All issues noted in this document were discussed and addressed. The format was not optimal for physical exam.    Subjective:    Patient ID: [name], [name]    DOB: June 13, 1966.  [name] is being paged in telehealth via telephone after he was seen in consultation for management of currently uncontrolled symptomatic diabetes requested by  [name], PA-C.   Past Medical History:  Diagnosis Date  . Arthritis   . Diabetes mellitus without complication ([place])    diagnosed about age 54  . Gout   . Hypertension    boarderline    Past Surgical History:  Procedure Laterality Date  . HERNIA REPAIR  2008  . MASS EXCISION Left age 54   L thigh- benign    Social History   Socioeconomic History  . Marital status: Single    Spouse name: Not on file  . Number of children: Not on file  . Years of education: Not on file  . Highest education level: Not on file  Occupational History  . Not on file  Tobacco Use  . Smoking status: Former Smoker    Packs/day: 0.50    Years: 5.00    Pack years: 2.50    Types: Cigarettes    Quit date: 1990    Years since quitting: 31.1  . Smokeless tobacco:  Never Used  Substance and Sexual Activity  . Alcohol use: No  . Drug use: No  . Sexual activity: Not on file  Other Topics Concern  . Not on file  Social History Narrative  . Not on file   Social Determinants of Health   Financial Resource Strain:   . Difficulty of Paying Living Expenses: Not on file  Food Insecurity:   . Worried About Charity fundraiser in the Last Year: Not on file  . Ran Out of Food in the Last Year: Not on file  Transportation Needs:   . Lack of Transportation (Medical): Not on file  . Lack of Transportation (Non-Medical): Not on file  Physical Activity:   . Days of Exercise per Week: Not on file  . Minutes of Exercise per Session: Not on file  Stress:   . Feeling of  Stress : Not on file  Social Connections:   . Frequency of Communication with Friends and Family: Not on file  . Frequency of Social Gatherings with Friends and Family: Not on file  . Attends Religious Services: Not on file  . Active Member of Clubs or Organizations: Not on file  . Attends Archivist Meetings: Not on file  . Marital Status: Not on file    Family History  Problem Relation Age of Onset  . Cancer Mother   . Diabetes Mother   . Heart failure Father   . Hypertension Father   . Diabetes Father   . Heart attack Father     Outpatient Encounter Medications as of 11/05/2019  Medication Sig  . atorvastatin (LIPITOR) 20 MG tablet Take 1 tablet (20 mg total) by mouth daily.  . insulin aspart (NOVOLOG) 100 UNIT/ML injection Inject 10-16 Units into the skin 3 (three) times daily before meals. Per ssi  . insulin glargine (LANTUS) 100 UNIT/ML injection Inject 0.6 mLs (60 Units total) into the skin at bedtime.  [name] lisinopril (ZESTRIL) 20 MG tablet Take 1 tablet (20 mg total) by mouth daily.  . metFORMIN (GLUCOPHAGE) 1000 MG tablet Take 1 tablet (1,000 mg total) by mouth 2 (two) times daily with a meal.  . metoprolol tartrate (LOPRESSOR) 100 MG tablet Take 1 tablet (100 mg  total) by mouth 2 (two) times daily.  [name] oxyCODONE-acetaminophen (PERCOCET) 10-325 MG tablet Take 1 tablet by mouth every 4 (four) hours as needed for pain.  . sitaGLIPtin (JANUVIA) 100 MG tablet Take 1 tablet (100 mg total) by mouth daily.   No facility-administered encounter medications on file as of 11/05/2019.    ALLERGIES: No Known Allergies  VACCINATION STATUS: Immunization History  Administered Date(s) Administered  . Influenza,inj,Quad PF,6+ Mos 06/30/2017    Diabetes He presents for his follow-up diabetic visit. He has type 2 diabetes mellitus. Onset time: He was diagnosed at approximate age of 54 years. His disease course has been improving. There are no hypoglycemic associated symptoms. Pertinent negatives for hypoglycemia include no confusion, headaches, pallor or seizures. Pertinent negatives for diabetes include no chest pain, no fatigue, no polydipsia, no polyphagia, no polyuria and no weakness. There are no hypoglycemic complications. Symptoms are improving. There are no diabetic complications. Risk factors for coronary artery disease include dyslipidemia, diabetes mellitus, hypertension, [name] sex, obesity and sedentary lifestyle. Current diabetic treatments: He is currently on Lantus 50 units nightly, NovoLog 2-12 units 3 times daily AC, Metformin 1000 g p.o. twice daily, Januvia 100 mg daily. His weight is fluctuating minimally. He is following a generally unhealthy diet. When asked about meal planning, he reported none. He has not had a previous visit with a dietitian. He never participates in exercise. His home blood glucose trend is decreasing steadily. His breakfast blood glucose range is generally 140-180 mg/dl. His lunch blood glucose range is generally 140-180 mg/dl. His dinner blood glucose range is generally 140-180 mg/dl. His bedtime blood glucose range is generally 140-180 mg/dl. His overall blood glucose range is 140-180 mg/dl. (He reports significantly improving glycemic  profile since last visit.  His glycemic profile are near target both fasting and postprandial.  His most recent A1c was 10.9% in January 2021.   ) An ACE inhibitor/angiotensin II receptor blocker is being taken. Eye exam is current.  Hyperlipidemia This is a chronic problem. The current episode started more than 1 year ago. Exacerbating diseases include diabetes and obesity. Pertinent negatives include no chest pain,  myalgias or shortness of breath. Current antihyperlipidemic treatment includes statins. Risk factors for coronary artery disease include dyslipidemia, diabetes mellitus, family history, hypertension, [name] sex, obesity and a sedentary lifestyle.  Hypertension This is a chronic problem. The current episode started more than 1 year ago. Pertinent negatives include no chest pain, headaches, neck pain, palpitations or shortness of breath. Risk factors for coronary artery disease include dyslipidemia, diabetes mellitus, [name] gender, obesity and sedentary lifestyle. Past treatments include ACE inhibitors and beta blockers.    Review of systems: Limited as above.   Objective:    Vitals with BMI 10/23/2019 10/08/2019 09/13/2019  Height 5\' 8"  - 5\' 8"   Weight 414 lbs 6 oz - 420 lbs  BMI 0000000 - 0000000  Systolic A999333 [id number] Q000111Q  Diastolic 81 84 85  Pulse 76 96 101    There were no vitals taken for this visit.  Wt Readings from Last 3 Encounters:  10/23/19 (!) 414 lb 6.4 oz (188 kg)  09/13/19 (!) 420 lb (190.5 kg)  07/05/19 (!) 420 lb (190.5 kg)       CMP ( most recent) CMP     Component Value Date/Time   NA 136 10/05/2019 1208   K 4.4 10/05/2019 1208   CL 100 10/05/2019 1208   CO2 27 10/05/2019 1208   GLUCOSE 276 (H) 10/05/2019 1208   BUN 12 10/05/2019 1208   CREATININE 0.75 10/05/2019 1208   CREATININE 1.42 (H) 09/08/2017 1540   CALCIUM 9.1 10/05/2019 1208   PROT 7.7 10/05/2019 1208   ALBUMIN 3.4 (L) 10/05/2019 1208   AST 18 10/05/2019 1208   ALT 24 10/05/2019 1208   ALKPHOS  95 10/05/2019 1208   BILITOT 0.9 10/05/2019 1208   GFRNONAA >60 10/05/2019 1208   GFRAA >60 10/05/2019 1208     Diabetic Labs (most recent): Lab Results  Component Value Date   HGBA1C 10.9 (H) 10/05/2019   HGBA1C 10.0 (H) 07/02/2019   HGBA1C 10.2 (H) 03/29/2019     Lipid Panel ( most recent) Lipid Panel     Component Value Date/Time   CHOL 200 10/05/2019 1208   TRIG 119 10/05/2019 1208   HDL 42 10/05/2019 1208   CHOLHDL 4.8 10/05/2019 1208   VLDL 24 10/05/2019 1208   LDLCALC 134 (H) 10/05/2019 1208      Lab Results  Component Value Date   TSH 0.532 06/28/2017       Assessment & Plan:   1. Uncontrolled type 2 diabetes mellitus with hyperglycemia (HCC)  - [name] has currently uncontrolled symptomatic type 2 DM since  54 years of age.   He reports significantly improving glycemic profile since last visit.  His glycemic profile are near target both fasting and postprandial.  His most recent A1c was 10.9% in January 2021.    -Recent labs reviewed. - I had a long discussion with him about the progressive nature of diabetes and the pathology behind its complications. -his diabetes is complicated by obesity/sedentary life, inadequate insurance coverage,  And he remains at a high risk for more acute and chronic complications which include CAD, CVA, CKD, retinopathy, and neuropathy. These are all discussed in detail with him.  - I have counseled him on diet  and weight management  by adopting a carbohydrate restricted/protein rich diet. Patient is encouraged to switch to  unprocessed or minimally processed     complex starch and increased protein intake (animal or plant source), fruits, and vegetables. -  he is advised to stick  to a routine mealtimes to eat 3 meals  a day and avoid unnecessary snacks ( to snack only to correct hypoglycemia).   - he  admits there is a room for improvement in his diet and drink choices. -  Suggestion is made for him to avoid simple  carbohydrates  from his diet including Cakes, Sweet Desserts / Pastries, Ice Cream, Soda (diet and regular), Sweet Tea, Candies, Chips, Cookies, Sweet Pastries,  Store Bought Juices, Alcohol in Excess of  1-2 drinks a day, Artificial Sweeteners, Coffee Creamer, and "Sugar-free" Products. This will help patient to have stable blood glucose profile and potentially avoid unintended weight gain.  - he will be scheduled with Jearld Fenton, RDN, CDE for diabetes education.  - I have approached him with the following individualized plan to manage  his diabetes and patient agrees:   -Given his significant glycemic burden, he will continue to need intensive treatment with basal/bolus insulin in order for him to achieve control of diabetes to target. -He is advised to continue Lantus 60 units nightly, continue NovoLog  10  units 3 times a day with meals  for pre-meal BG readings of 90-150mg /dl, plus patient specific correction dose for unexpected hyperglycemia above 150mg /dl, associated with strict monitoring of glucose 4 times a day-before meals and at bedtime. - he is warned not to take insulin without proper monitoring per orders. - Adjustment parameters are given to him for hypo and hyperglycemia in writing. - he is encouraged to call clinic for blood glucose levels less than 70 or above 300 mg /dl. - he is advised to continue Metformin 1000 mg p.o. twice daily, therapeutically suitable for patient . -He is also advised to continue Januvia 100 mg p.o. daily at breakfast.  - he will be considered for GLP-1 receptor agonist instead of DPP 4 inhibitor  as appropriate next visit.  - Specific targets for  A1c;  LDL, HDL,  and Triglycerides were discussed with the patient.  2) Blood Pressure /Hypertension:   he is advised to home monitor blood pressure and report if > 140/90 on 2 separate readings.  he is advised to continue his current medications including lisinopril 20 mg p.o. daily with breakfast . 3)  Lipids/Hyperlipidemia:   Review of his recent lipid panel showed  controlled  LDL at 134 .  he  is advised to continue atorvastatin 20 mg p.o. daily at bedtime.   Side effects and precautions discussed with him.  4)  Weight/Diet: His BMI 63-   clearly complicating his diabetes care.   he is  a candidate for modest weight loss. I discussed with him the fact that loss of 5 - 10% of his  current body weight will have the most impact on his diabetes management.  Exercise, and detailed carbohydrates information provided  -  detailed on discharge instructions.  He is a good candidate for bariatric surgery, however at this point he has no insurance.  5) Chronic Care/Health Maintenance:  -he  is on ACEI/ARB and Statin medications and  is encouraged to initiate and continue to follow up with Ophthalmology, Dentist,  Podiatrist at least yearly or according to recommendations, and advised to   stay away from smoking. I have recommended yearly flu vaccine and pneumonia vaccine at least every 5 years; moderate intensity exercise for up to 150 minutes weekly; and  sleep for at least 7 hours a day.  - he is  advised to maintain close follow up with [name], PA-C for primary  care needs, as well as his other providers for optimal and coordinated care.   - Time spent on this patient care encounter:  35 min, of which >50% was spent in  counseling and the rest reviewing his  current and  previous labs/studies ( including abstraction from other facilities),  previous treatments, his blood glucose readings, and medications' doses and developing a plan for long-term care based on the latest recommendations for standards of care; and documenting his care.  [name] participated in the discussions, expressed understanding, and voiced agreement with the above plans.  All questions were answered to his satisfaction. he is encouraged to contact clinic should he have any questions or concerns prior to his return  visit.   Follow up plan: - Return in about 9 weeks (around 01/07/2020) for Bring Meter and Logs- A1c in Office, Include 8 log sheets.  Glade Lloyd, MD [facility] [address] [place], [name] [address] Phone: [phone]  Fax: [phone]    11/05/2019, 1:19 PM  This note was partially dictated with voice recognition software. Similar sounding words can be transcribed inadequately or may not  be corrected upon review.

## 2019-11-05 NOTE — Progress Notes (Signed)
BP (!) 150/82    Subjective:    Patient ID: [name], male    DOB: July 19, 1966, 54 y.o.   MRN: [id number]  HPI: [name] is a 54 y.o. male presenting on 11/05/2019 for No chief complaint on file.   HPI    This is a telemedicine appointment due to coronavirus pandemic.  I connected with  [name] on 11/05/19  by a video enabled telemedicine application and verified that I am speaking with the correct person using two identifiers.   I discussed the limitations of evaluation and management by telemedicine. The patient expressed understanding and agreed to proceed.  Pt is at home.  Provider is at office.   Pt is 71yoM with morbid obesity, uncontrolled DM and HTN.   [name].  Pt sees dr Dorris Fetch for management of his DM.  Pt was confused and thought Dr Dorris Fetch said to discontinue [place].  His Office note says to continue it for now.   Relevant past medical, surgical, family and social history reviewed and updated as indicated. Interim medical history since our last visit reviewed. Allergies and medications reviewed and updated.   Current Outpatient Medications:  .  atorvastatin (LIPITOR) 20 MG tablet, Take 1 tablet (20 mg total) by mouth daily., Disp: 90 tablet, Rfl: 1 .  insulin aspart (NOVOLOG) 100 UNIT/ML injection, Inject 10-16 Units into the skin 3 (three) times daily before meals. Per ssi, Disp: 20 mL, Rfl: 2 .  insulin glargine (LANTUS) 100 UNIT/ML injection, Inject 0.6 mLs (60 Units total) into the skin at bedtime., Disp: 20 mL, Rfl: 2 .  lisinopril (ZESTRIL) 20 MG tablet, Take 1 tablet (20 mg total) by mouth daily., Disp: 90 tablet, Rfl: 1 .  metFORMIN (GLUCOPHAGE) 1000 MG tablet, Take 1 tablet (1,000 mg total) by mouth 2 (two) times daily with a meal., Disp: 180 tablet, Rfl: 1 .  metoprolol tartrate (LOPRESSOR) 100 MG tablet, Take 1 tablet (100 mg total) by mouth 2 (two) times daily., Disp: 180 tablet, Rfl: 0 .  oxyCODONE-acetaminophen (PERCOCET) 10-325  MG tablet, Take 1 tablet by mouth every 4 (four) hours as needed for pain., Disp: 170 tablet, Rfl: 0 .  sitaGLIPtin (JANUVIA) 100 MG tablet, Take 1 tablet (100 mg total) by mouth daily., Disp: 90 tablet, Rfl: 0     Review of Systems  Per HPI unless specifically indicated above     Objective:    BP (!) 150/82   Wt Readings from Last 3 Encounters:  10/23/19 (!) 414 lb 6.4 oz (188 kg)  09/13/19 (!) 420 lb (190.5 kg)  07/05/19 (!) 420 lb (190.5 kg)    Physical Exam Pulmonary:     Effort: No respiratory distress.  Neurological:     Mental Status: He is alert and oriented to person, place, and time.  Psychiatric:        Attention and Perception: Attention normal.        Speech: Speech normal.        Behavior: Behavior is cooperative.            Assessment & Plan:    Encounter Diagnoses  Name Primary?  . Essential hypertension Yes  . Uncontrolled type 2 diabetes mellitus with hyperglycemia ([place])   . Morbid obesity ([place])   . Hyperlipidemia, unspecified hyperlipidemia type      -Increase lisinopril to 20mg  2 tabs daily -pt to continue with Dr Dorris Fetch for management of DM -pt to follow up  in office in 1 month to recheck bp.  Pt to contact office sooner prn

## 2019-11-06 ENCOUNTER — Telehealth: Payer: Self-pay | Admitting: Orthopaedic Surgery

## 2019-11-06 MED ORDER — OXYCODONE-ACETAMINOPHEN 10-325 MG PO TABS: 1.0000 | ORAL_TABLET | ORAL | 0 refills | Status: DC | PRN

## 2019-11-06 NOTE — Telephone Encounter (Signed)
Patient's refill for Oxycodone/Acetaminphen 10-325 mgs. Has been being for 170 tablets.  He is requesting 180 tablets instead of the 170.  He states that he uses the [facility] prescription card due to not having insurance.  According to pt, [facility] wont pay for 170 pills but will pay for 180 pills.    He is still using [facility]

## 2019-11-22 ENCOUNTER — Ambulatory Visit: Payer: Self-pay | Admitting: Nutrition

## 2019-12-04 ENCOUNTER — Telehealth: Payer: Self-pay | Admitting: Orthopaedic Surgery

## 2019-12-04 MED ORDER — OXYCODONE-ACETAMINOPHEN 10-325 MG PO TABS: 1.0000 | ORAL_TABLET | ORAL | 0 refills | Status: DC | PRN

## 2019-12-04 NOTE — Telephone Encounter (Signed)
Patient requests refill on Oxycodone/Acetaminophen 10-325  Mgs.  Qty  170  Sig: Take 1 tablet by mouth every 4 (four) hours as needed for pain.  [facility] states [facility] uses [facility]

## 2019-12-11 ENCOUNTER — Ambulatory Visit: Payer: Self-pay | Admitting: Physician Assistant

## 2019-12-11 ENCOUNTER — Ambulatory Visit: Payer: Self-pay | Admitting: Orthopaedic Surgery

## 2019-12-13 ENCOUNTER — Other Ambulatory Visit: Payer: Self-pay

## 2019-12-13 ENCOUNTER — Ambulatory Visit (INDEPENDENT_AMBULATORY_CARE_PROVIDER_SITE_OTHER): Payer: Self-pay | Admitting: Orthopaedic Surgery

## 2019-12-13 ENCOUNTER — Encounter: Payer: Self-pay | Admitting: Orthopaedic Surgery

## 2019-12-13 VITALS — BP 149/88 | HR 88 | Temp 96.8°F | Ht 68.0 in | Wt 385.0 lb

## 2019-12-13 DIAGNOSIS — G8929 Other chronic pain: Secondary | ICD-10-CM

## 2019-12-13 DIAGNOSIS — M25562 Pain in left knee: Secondary | ICD-10-CM

## 2019-12-13 DIAGNOSIS — Z6841 Body Mass Index (BMI) 40.0 and over, adult: Secondary | ICD-10-CM

## 2019-12-13 DIAGNOSIS — M25561 Pain in right knee: Secondary | ICD-10-CM

## 2019-12-13 NOTE — Progress Notes (Signed)
Patient AL:[id number] [name], male DOB:06-Mar-1966, 54 y.o. [id number]  Chief Complaint  Patient presents with  . Knee Pain    Bilateral/ hurting bad    HPI  [name] is a 54 y.o. male who has chronic pain of the knees.  He has no new trauma.  He has good and bad days.  He has no redness or numbness.  He is taking his medicine.  He has swelling and popping.   Body mass index is 58.54 kg/m.  ROS  Review of Systems  Constitutional: Positive for activity change.  HENT: Negative for congestion.   Respiratory: Positive for shortness of breath. Negative for cough.   Cardiovascular: Positive for leg swelling. Negative for chest pain.  Endocrine: Negative for cold intolerance.  Musculoskeletal: Positive for arthralgias, back pain, gait problem, joint swelling and myalgias.  Allergic/Immunologic: Positive for environmental allergies.  Neurological: Negative for numbness.  All other systems reviewed and are negative.   All other systems reviewed and are negative.  The following is a summary of the past history medically, past history surgically, known current medicines, social history and family history.  This information is gathered electronically by the computer from prior information and documentation.  I review this each visit and have found including this information at this point in the chart is beneficial and informative.    Past Medical History:  Diagnosis Date  . Arthritis   . Diabetes mellitus without complication ([place])    diagnosed about age 8  . Gout   . Hypertension    boarderline    Past Surgical History:  Procedure Laterality Date  . HERNIA REPAIR  2008  . MASS EXCISION Left age 76   L thigh- benign    Family History  Problem Relation Age of Onset  . Cancer Mother   . Diabetes Mother   . Heart failure Father   . Hypertension Father   . Diabetes Father   . Heart attack Father     Social History Social History   Tobacco Use  . Smoking status: Former  Smoker    Packs/day: 0.50    Years: 5.00    Pack years: 2.50    Types: Cigarettes    Quit date: 1990    Years since quitting: 31.2  . Smokeless tobacco: Never Used  Substance Use Topics  . Alcohol use: No  . Drug use: No    No Known Allergies  Current Outpatient Medications  Medication Sig Dispense Refill  . atorvastatin (LIPITOR) 20 MG tablet Take 1 tablet (20 mg total) by mouth daily. 90 tablet 1  . insulin aspart (NOVOLOG) 100 UNIT/ML injection Inject 10-16 Units into the skin 3 (three) times daily before meals. Per ssi 20 mL 2  . insulin glargine (LANTUS) 100 UNIT/ML injection Inject 0.6 mLs (60 Units total) into the skin at bedtime. 20 mL 2  . lisinopril (ZESTRIL) 20 MG tablet Take 2 tablets (40 mg total) by mouth daily. 60 tablet 1  . metFORMIN (GLUCOPHAGE) 1000 MG tablet Take 1 tablet (1,000 mg total) by mouth 2 (two) times daily with a meal. 180 tablet 1  . metoprolol tartrate (LOPRESSOR) 100 MG tablet Take 1 tablet (100 mg total) by mouth 2 (two) times daily. 180 tablet 0  . oxyCODONE-acetaminophen (PERCOCET) 10-325 MG tablet Take 1 tablet by mouth every 4 (four) hours as needed for pain. 170 tablet 0  . sitaGLIPtin (JANUVIA) 100 MG tablet Take 1 tablet (100 mg total) by mouth daily. 90 tablet  0   No current facility-administered medications for this visit.     Physical Exam  Blood pressure (!) 149/88, pulse 88, temperature (!) 96.8 F (36 C), height 5\' 8"  (1.727 m), weight (!) 385 lb (174.6 kg).  Constitutional: overall normal hygiene, normal nutrition, well developed, normal grooming, normal body habitus. Assistive device:none  Musculoskeletal: gait and station Limp right, muscle tone and strength are normal, no tremors or atrophy is present.  .  Neurological: coordination overall normal.  Deep tendon reflex/nerve stretch intact.  Sensation normal.  Cranial nerves II-XII intact.   Skin:   Normal overall no scars, lesions, ulcers or rashes. No  psoriasis.  Psychiatric: Alert and oriented x 3.  Recent memory intact, remote memory unclear.  Normal mood and affect. Well groomed.  Good eye contact.  Cardiovascular: overall no swelling, no varicosities, no edema bilaterally, normal temperatures of the legs and arms, no clubbing, cyanosis and good capillary refill.  Lymphatic: palpation is normal.   Both knees have effusion, crepitus, ROM right 0 to 90, left 0 to 95, limp right.  All other systems reviewed and are negative   The patient has been educated about the nature of the problem(s) and counseled on treatment options.  The patient appeared to understand what I have discussed and is in agreement with it.  Encounter Diagnoses  Name Primary?  . Chronic pain of right knee Yes  . Chronic pain of left knee   . Morbid obesity due to excess calories ([place])   . Body mass index 60.0-69.9, adult [facility])     PLAN Call if any problems.  Precautions discussed.  Continue current medications.   Return to clinic 3 months   Electronically Signed Sanjuana Kava, MD 4/8/202111:00 AM

## 2019-12-19 ENCOUNTER — Other Ambulatory Visit: Payer: Self-pay

## 2019-12-19 ENCOUNTER — Ambulatory Visit: Payer: Self-pay | Admitting: Physician Assistant

## 2019-12-19 ENCOUNTER — Encounter: Payer: Self-pay | Admitting: Physician Assistant

## 2019-12-19 VITALS — BP 170/87 | HR 104 | Temp 94.8°F

## 2019-12-19 DIAGNOSIS — I10 Essential (primary) hypertension: Secondary | ICD-10-CM

## 2019-12-19 DIAGNOSIS — E785 Hyperlipidemia, unspecified: Secondary | ICD-10-CM

## 2019-12-19 DIAGNOSIS — E1165 Type 2 diabetes mellitus with hyperglycemia: Secondary | ICD-10-CM

## 2019-12-19 MED ORDER — ATORVASTATIN CALCIUM 20 MG PO TABS: 20.0000 mg | ORAL_TABLET | Freq: Every day | ORAL | 1 refills | Status: DC

## 2019-12-19 MED ORDER — AMLODIPINE BESYLATE 5 MG PO TABS: 5.0000 mg | ORAL_TABLET | Freq: Every day | ORAL | 3 refills | Status: DC

## 2019-12-19 NOTE — Progress Notes (Signed)
BP (!) 170/87   Pulse (!) 104   Temp (!) 94.8 F (34.9 C)   SpO2 97%    Subjective:    Patient ID: [name], male    DOB: 08-28-1966, 54 y.o.   MRN: [id number]  HPI: [name] is a 54 y.o. male presenting on 12/19/2019 for No chief complaint on file.   HPI   Pt had negative covid 19 screening questionnaire.    Pt is 45yoM with morbid obesity, HTN and uncontrolled DM.  [name].  Pt says he has been eating junk lately and didn't take meds this morning.    Relevant past medical, surgical, family and social history reviewed and updated as indicated. Interim medical history since our last visit reviewed. Allergies and medications reviewed and updated.   Current Outpatient Medications:  .  insulin aspart (NOVOLOG) 100 UNIT/ML injection, Inject 10-16 Units into the skin 3 (three) times daily before meals. Per ssi, Disp: 20 mL, Rfl: 2 .  insulin glargine (LANTUS) 100 UNIT/ML injection, Inject 0.6 mLs (60 Units total) into the skin at bedtime., Disp: 20 mL, Rfl: 2 .  lisinopril (ZESTRIL) 20 MG tablet, Take 2 tablets (40 mg total) by mouth daily., Disp: 60 tablet, Rfl: 1 .  metFORMIN (GLUCOPHAGE) 1000 MG tablet, Take 1 tablet (1,000 mg total) by mouth 2 (two) times daily with a meal., Disp: 180 tablet, Rfl: 1 .  metoprolol tartrate (LOPRESSOR) 100 MG tablet, Take 1 tablet (100 mg total) by mouth 2 (two) times daily., Disp: 180 tablet, Rfl: 0 .  oxyCODONE-acetaminophen (PERCOCET) 10-325 MG tablet, Take 1 tablet by mouth every 4 (four) hours as needed for pain., Disp: 170 tablet, Rfl: 0 .  sitaGLIPtin (JANUVIA) 100 MG tablet, Take 1 tablet (100 mg total) by mouth daily., Disp: 90 tablet, Rfl: 0 .  amLODipine (NORVASC) 5 MG tablet, Take 1 tablet (5 mg total) by mouth daily., Disp: 30 tablet, Rfl: 3 .  atorvastatin (LIPITOR) 20 MG tablet, Take 1 tablet (20 mg total) by mouth daily., Disp: 30 tablet, Rfl: 1     Review of Systems  Per HPI unless specifically  indicated above     Objective:    BP (!) 170/87   Pulse (!) 104   Temp (!) 94.8 F (34.9 C)   SpO2 97%   Wt Readings from Last 3 Encounters:  12/13/19 (!) 385 lb (174.6 kg)  10/23/19 (!) 414 lb 6.4 oz (188 kg)  09/13/19 (!) 420 lb (190.5 kg)    Physical Exam Constitutional:      General: He is not in acute distress.    Appearance: He is obese. He is not ill-appearing.  HENT:     Head: Normocephalic and atraumatic.  Pulmonary:     Effort: Pulmonary effort is normal. No respiratory distress.  Neurological:     Mental Status: He is alert and oriented to person, place, and time.  Psychiatric:        Attention and Perception: Attention normal.        Speech: Speech normal.        Behavior: Behavior is cooperative.           Assessment & Plan:    Encounter Diagnoses  Name Primary?  . Essential hypertension Yes  . Uncontrolled type 2 diabetes mellitus with hyperglycemia ([place])   . Hyperlipidemia, unspecified hyperlipidemia type   . Morbid obesity ([place])      -Pt reticent about covid vaccination.  All  questions answered and he scheduled appointment to get vaccination tomorrow  -amlodipine added for HTN.  Pt to continue current meds.  Pt to continue to monitor bp at home and is to notify office if it stays elevated when he takes his meds  -pt encouraged to eat nutritiously and exercise regularly to help his energy level and his medical problems.    -pt to continue with Dr Dorris Fetch for DM.  He is scheduled with RD as well.    -pt to follow up 3 months.  He is to contact office sooner prn

## 2020-01-02 ENCOUNTER — Telehealth: Payer: Self-pay | Admitting: Orthopaedic Surgery

## 2020-01-02 MED ORDER — OXYCODONE-ACETAMINOPHEN 10-325 MG PO TABS: 1.0000 | ORAL_TABLET | ORAL | 0 refills | Status: DC | PRN

## 2020-01-02 NOTE — Telephone Encounter (Signed)
Patient called for refill: oxyCODONE-acetaminophen (PERCOCET) 10-325 MG tablet 170 tablet  -[facility], [place] 

## 2020-01-03 ENCOUNTER — Ambulatory Visit: Payer: Self-pay | Admitting: Nutrition

## 2020-01-08 ENCOUNTER — Ambulatory Visit: Payer: Self-pay | Admitting: "Endocrinology

## 2020-01-28 ENCOUNTER — Telehealth: Payer: Self-pay

## 2020-01-28 MED ORDER — OXYCODONE-ACETAMINOPHEN 10-325 MG PO TABS: 1.0000 | ORAL_TABLET | ORAL | 0 refills | Status: DC | PRN

## 2020-01-28 NOTE — Telephone Encounter (Signed)
Oxycodone-Acetaminophen 10/325mg    Qty 170 Tablets  [facility] USES [facility] [place]

## 2020-02-21 ENCOUNTER — Telehealth: Payer: Self-pay

## 2020-02-21 NOTE — Telephone Encounter (Signed)
First attempt to contact potential enrollee  by phone to obtain consent to enroll into the Remote Monitoring in the [facility] Hypertension Program per a referral sent by the [facility] to assist with BP management.                                                       Pt patient was unable to be reached at the time therefore, a voice message was left for patient to return the call.

## 2020-02-25 ENCOUNTER — Other Ambulatory Visit: Payer: Self-pay | Admitting: Physician Assistant

## 2020-02-25 DIAGNOSIS — I10 Essential (primary) hypertension: Secondary | ICD-10-CM

## 2020-02-25 DIAGNOSIS — E1165 Type 2 diabetes mellitus with hyperglycemia: Secondary | ICD-10-CM

## 2020-02-25 DIAGNOSIS — E785 Hyperlipidemia, unspecified: Secondary | ICD-10-CM

## 2020-02-26 ENCOUNTER — Other Ambulatory Visit: Payer: Self-pay | Admitting: Radiology

## 2020-02-26 MED ORDER — OXYCODONE-ACETAMINOPHEN 10-325 MG PO TABS: 1.0000 | ORAL_TABLET | ORAL | 0 refills | Status: DC | PRN

## 2020-02-27 ENCOUNTER — Telehealth: Payer: Self-pay

## 2020-02-27 NOTE — Telephone Encounter (Signed)
Pt was f/u by phone on today to address any questions or concerns he may have had due to his first day of using the equipment and collecting BP and Scale measurements.   Pt stated he felt ok about using the equipment, however he was concerned about the reasoning his initial BP reading and scale reading did not show up on his phone.  The steps of programming the equipment were reviewed in addition to the connectivity issues (bluetooth connections, wi-fi- internet connections and being attentive to [employer] home app being open at time of measuring reading  The issues were resolved upon reviewing the steps.  Pt was appreciative and phone call ended.

## 2020-02-27 NOTE — Congregational Nurse Program (Signed)
Patient presents to [facility] on today to initiate enrollment in the Remote Monitoring in the [place]Hypertension Program and states readiness to participate.  He states he really wants help with better managing his health and needs a program like this to provide him with step by step  guidance    Pt successfully onboarded with initial BP and scale test readings that connected  to the [place] and provider dashboard successfully.   Initial BP Test reading was 143/89 (pm in reading in office)  and weight was 411lbs with shoes on and clothed on [place]  Pt was provided with Remote Monitoriing in the [facility]HT[facility] patient facing booklet that reviewed all program requirements, guidelines, dash diet brochure/guidelines, proper techinque for monitoring and troubleshooting equipment.   -Reviewed and verified patient's BP Medication and consistency of use with patient.     - Pt understood guidelines and expectations of program,  -Pt understood and demonstrated proper use of BP and Scale equipment by way of teachbacks after it was demonstrated to her - Pt was provided BP Basics Manual with a coordinating BP-Personal Action Plan that was completed on site and placed in chart --Pt identified and created her personal action plan goals successully           Plan:        -Pt will check BP twice daily between   8:30-9am and between 8:30-9pm -Pt will monitor his weight  once (1) daily without clothing (btw 8:30-9am) -Pt will increase his physical activity to 30  mins/ every other day out of 7days -Pt will increase her activity level to include  Performing yard work of some type at least 30 mins every other day -Pt will follow a healthy eating plan by first working on removing one high salt item from his current diet -Pt will continue to f/u with provider as recommended and continue medications as prescribed  -Pt will pick the newest BP meds (amlodopine) from pharmacy that he  had not picked up from when first [facility] -1st F/U will be made on (Wed) 01/02/20 to check in regarding 1st day use of the equipment and to address any questions -Next Day/Check in F/U will be conducted on (Wed) 02/27/20  -RN Case Manager will review  7 day  BP result average and trends weekly

## 2020-02-27 NOTE — Congregational Nurse Program (Signed)
Client was successfully onboarded for the [employer] remote monitoring program for hypertension in collaboration with [facility].  Reviewed client's personal action plan that [name] assisted.  RN will monitor daily reported blood pressure readings for any escalations or trends and report those per program guidelines with [facility] provider.  Will meet weekly with [name] LPN to discuss client's progress and any potential barriers to meeting goals and develop patient centered solutions to help client meet his target goals established.  Will report and document every 7 days averages of reported blood pressure readings and submit those readings as needed for client's follow up appointments with [facility] provider.    [name] RN  [name]

## 2020-03-03 ENCOUNTER — Telehealth: Payer: Self-pay

## 2020-03-03 NOTE — Telephone Encounter (Addendum)
  F/uweeklyphonecallwith patient to address challenges or successes of BP/Weight checks/Personal Action Plan.   Pt states he is having a success monitoring her BP and weightwith no issues related to equipmentuse.  States he continues to take his  BP medicine on time and denies any side affects/complications.   States he was having a difficult time measuring his BP in the evening, due to either forgetting or other tasks that may tend to delay him doing so.     Personal Action Plan Timeline  -Pt willcheck BP twice daily between   8:30-9am and between 8:30-9pm  (OFF [employer])  No pm readings due to hard to find time to measure at night  -Pt will monitor his weight once (1) daily withoutclothing (btw 8:30-9am)  (ON TARGET) (conducts daily)  -Pt will increase hisphysicalactivityto 30  mins/ every other day out of 7days (ON TARGET)  (Mins range from 15-30 every other day) about 3 days   -Pt willincrease her activity levelto include  Performing yard work of some type at least 30 mins every other day (ON TARGET) (have been completing yard work and  household chores (mopping, Merchandiser, retail)  -Pt will follow a healthy eating plan by first working on removing one high salt item from his current diet  (Cut back on the fried foods and no did not add salt directly to food after it is cooked)   -Pt will continue to f/u with provider as recommended and continue medications as prescribed (ON TARGET)  Takes BP and other meds on timely manner  -Pt will pick the newest BP meds (amlodopine) from pharmacy that he had not picked up from when first prescribed by [facility] (ON TARGET) taking daily   Successes -He has not been eating after 10pm unlike previously where he would eat through out the night several times -Did not put salt on food directly after it was cooked -Reduce eating fried foods  Barriers -Changing eating habits   Personal Action Plan Revisons for  Next Week -Do not use seasonings ([employer], All Seasoning) focus more on using seasoning that are "powder" versus "salt" titles -Attempt to increase exercise activity to about 45 mins every other day versus 30 mins -Continuing to try not to eat meals late night/after 10pm, but attempting more towards 7pm time to not eat after   [place] F/u with patient (Tues) 03/10/20 for weekly check -in

## 2020-03-05 NOTE — Telephone Encounter (Signed)
This encounter was created in error - please disregard.

## 2020-03-11 ENCOUNTER — Ambulatory Visit: Payer: Self-pay | Admitting: Orthopaedic Surgery

## 2020-03-12 NOTE — Congregational Nurse Program (Signed)
Review of client's past 7 day [name] remote monitoring program for hypertension readings. Client has had 12 readings so far. Off target with monitoring, missing PM readings. Health coaching and goal setting plans in place and reviewed. [name] Hoard [name] working with client to set personal action plan goals and providing weekly and PRN health coaching re.garding diet, medications and exercise.   Last 7 day average blood pressure reported reading: 139/86 (within target goal) Last 7 day highest blood pressure reading recorded: 155/88 Last 7 day lowest blood pressure reading recorded: 124/79   Plan: client is on target for medication compliance, is taking new medication Amlodipine  Client is doing more outside yard work and other household activities to increase movement and exercise.  Client will continue to work on lifestyle changes regarding diet and dietary choices. Client will be meeting with [name] Nevins [name] for basic health coaching regarding [name] diabetes in conjunction with DASH diet plan. Client will learn tools to make healthier dietary choices.  [name] will continue to monitor daily blood pressure recording on [facility]ch [facility] dashboard for any trends or escalations. [name] will continue to meet once weekly with [name] [name] to discuss client's progress and action plan. [name] Hoard [name] will continue to provide health coaching.   [name] [name]

## 2020-03-13 ENCOUNTER — Telehealth: Payer: Self-pay

## 2020-03-13 NOTE — Telephone Encounter (Signed)
F/uweeklyphonecallwith patient to address challenges or successes of BP/Weight checks/Personal Action Plan.   Pt states he is having a success monitoring her BP and weightwith no issues related to equipmentuse.  States he continues to take his  BP medicine on time and denies any side affects/complications.        Personal Action Plan Timeline  -Pt willcheck BP twice dailybetween 8:30-9am andbetween 8:30-9pm  (ON TARGET WITH AM BP READINDS, but not completing PM readings every night but has improved  -Pt will monitorhisweight once (1) daily withoutclothing(btw 8:30-9am)  (ON TARGET) (conducts daily)  -Pt will increasehisphysicalactivityto13mins/ every otherdayout of 7days (OFF TARGET)  (Continue to range in time of  61mins  about 3 days on last week  -Pt willincrease her activity leveltoinclude Performing yard work of some type at least 30 mins every other day  (OFF TARGET) (continus to complete yard work and  household chores (mopping, Merchandiser, retail) for activity, but only completed about 3 days of physical activity (light) for about 15 mins total)  -Pt will follow a healthy eating plan by first working on removing one high salt item from his current diet  ( Continues to Cut back on eating NO  fried foods and no does not add salt directly to food after it is cooked)   -Pt will continue to f/u with provider as recommended and continue medications as prescribed(ON TARGET)  Continues to take BP and other meds on timely manner Has picked up and  been taking  BP meds (amlodipine) as directed since  03/06/20, since he was able to come across resource by way of [facility] program to purchase med   Successes -Utilized different spices for foods such as hot sauce, black pepper in place of salt -Reduce eating fried foods, ate and cooked more baked food oven  -Broiled hamburger instead of frying  Barriers  -Continues to be a struggle with changing  eating habits- more so with sweets, but is trying -Lack of motivation -Problems with knees (arthritis) which interferes with the amount of time he walks,  States he needs some braces that were recommended by his orthopaedic provider that could help with his stamina in walking more/bending knees   Personal Action Plan Revisons for Next Week --Continue working on using salt substitutes when cooking at home   -Attempt to work on ways to  increase exercise activity and have conversation with his orthopaedic provider about obtaining the knees braces to assist with ability of increasing exercise for the near future. (Appt is on 7/20)  -Continuing to try not to eat full meals after 10pm and avoid eating unhealthy sugary snacks in middle of night after10pm  -Attempt to add more fruit to diet to  to replace sugar cravings and to help with BP and healthier eating  -Attempt to stop drinking  3-4 soft drinks a day   -Work on remembering to take PM readings although it had been improved the past couple of days    [place] -F/u with patient (Tues) 03/17/20 for weekly check -in -Provide or assist to access the [employer] list and other resources to obtain fresh fruit and vegetables -Assist with find exercises to participate in while sitting down

## 2020-03-14 NOTE — Congregational Nurse Program (Signed)
Reviewed client's last 7 day blood pressure recordings from [facility] portal. 19 readings as of 03/12/20. Client still having difficulty with PM readings, but is making Effort. He has continued having weekly phone check ins with Merlinda Frederick LPN for health coaching with DASH diet and hypertension personal Action Plan.  Recorded averages of blood pressures are as follows.  Last 7 day average recorded blood pressure: 129/78 Highest reading recorded last 7 days: 145/82 Lowest 7 day average reading: 96/54  Plan: Client will continue to follow personal action plan for hypertension including measuring readings consistently AM/PM client improving.  Client will still have weekly check ins with Marnee Spring LPN for health coaching and support. Will be adding weekly Tuesday am follow ups by phone with [name] RN for health coaching and support for DM. ( Met with client on 03/12/20 to begin DM health coaching.. See FHASES for note: will meet once a month in person and have weekly phone check ins. Client is applying for [facility] financial assistance, so that he can continue his care with Dr. Dorris Fetch Endocrinologist as well as Jearld Fenton Dietician CDE as well as orthopedics for his knee issues. )  Encouraged client to keep up his progress of baking and not frying foods, we discussed how to minimize salt in canned goods when preparing. Discussed Myplate method as well as substitutes for sugary cravings at night and why these are happening. Client was encouraged to continue to cut out sugary sodas and drink more water and the reasoning for increasing water to help decrease blood sugar levels and hydration.   Client is limited currently with exercise due to weight and stress on knee that he has seen orthopedics for in the past. Will obtain hand weights and resistance band for client to begin incorporating Chair exercises with his increased lawn and home care activities. Chair exercises with instructions and video  will be sent to client as well as printed out. Will plan to begin upper body exercises until seen by PCP on 03/19/20 for clearance medically due to orthopedic issues.  Will continue to monitor daily blood pressure readings on [facility] portal and note any trends or escalations.  Will continue to review client's progress with Marnee Spring LPN weekly.  Recommendations for client on 03/12/20 Complete [facility] [employer] application, checklist of documents needed given to client as care connect staff can assist. Discussed how this can help open doors for further needs his provider may need to refer him to.  Recommended: Applying for food stamps to assist in being able to purchase some healthier vegetables and fruits. Client is not wanting to pursue at this time. Discussed farmers markets as well as food pantry's and [employer] though these can be limited on fresh fruits and non-starchy vegetables. Will recommend client seek vegetables available at the [facility]'s garden that is available to assist patients.  Drink more water, cut out sugary drinks all together. Purchase less concentrated sweets such as cakes, pies pastries etc that tempt client to make those "comfort" choices at night. Recommended a before bed healthy snack of something such as a medium apple for the "sweet" paired with protein such as cheese or some almond butter (limited)to apple sliced. Will continue to discuss choices and lifestyle changes to benefit blood pressure and diabetes as well as weight loss. Client is motivated and recognizes it is one step at a time.  Client reported area of concern on his foot. We discussed DM foot care and daily inspection.  Recommended client discuss this with his provider at [facility] on 03/19/20 or sooner if worsens. Client states history of past foot wound that required wound care center treatment. We discussed keeping his blood sugars in control and working to lower his A1C will help decrease chances of  infection and aid in quicker healing.   Will continue to follow for Remote patient monitoring for blood pressure as well as DM health coaching.  * client has not been able to check his blood sugars 4 x daily as provider recommends due to medicine regimen due to client cannot afford to purchase more at this time.  [employer] meter given along with bottle of 50 strips. Reviewed his medications and discussed blood sugar monitoring with insulins as per recommendations of providers.  [name] RN

## 2020-03-17 ENCOUNTER — Other Ambulatory Visit (HOSPITAL_COMMUNITY)
Admission: RE | Admit: 2020-03-17 | Discharge: 2020-03-17 | Disposition: A | Discharge status: Home / Self Care | Payer: Self-pay | Source: Ambulatory Visit | Attending: Physician Assistant | Admitting: Physician Assistant

## 2020-03-17 ENCOUNTER — Other Ambulatory Visit: Payer: Self-pay

## 2020-03-17 DIAGNOSIS — E785 Hyperlipidemia, unspecified: Secondary | ICD-10-CM | POA: Insufficient documentation

## 2020-03-17 DIAGNOSIS — E1165 Type 2 diabetes mellitus with hyperglycemia: Secondary | ICD-10-CM | POA: Insufficient documentation

## 2020-03-17 DIAGNOSIS — I10 Essential (primary) hypertension: Secondary | ICD-10-CM | POA: Insufficient documentation

## 2020-03-17 LAB — LIPID PANEL
Cholesterol: 205 mg/dL — ABNORMAL HIGH (ref 0–200)
HDL: 41 mg/dL (ref >40)
LDL Cholesterol: 144 mg/dL — ABNORMAL HIGH (ref 0–99)
Total CHOL/HDL Ratio: 5 RATIO
Triglycerides: 100 mg/dL (ref <150)
VLDL: 20 mg/dL (ref 0–40)

## 2020-03-17 LAB — COMPREHENSIVE METABOLIC PANEL
ALT: 18 U/L (ref 0–44)
AST: 14 U/L — ABNORMAL LOW (ref 15–41)
Albumin: 3.1 g/dL — ABNORMAL LOW (ref 3.5–5.0)
Alkaline Phosphatase: 88 U/L (ref 38–126)
Anion gap: 7 (ref 5–15)
BUN: 8 mg/dL (ref 6–20)
CO2: 26 mmol/L (ref 22–32)
Calcium: 8.6 mg/dL — ABNORMAL LOW (ref 8.9–10.3)
Chloride: 103 mmol/L (ref 98–111)
Creatinine, Ser: 0.55 mg/dL — ABNORMAL LOW (ref 0.61–1.24)
GFR calc Af Amer: >60 mL/min (ref >60)
GFR calc non Af Amer: >60 mL/min (ref >60)
Glucose, Bld: 183 mg/dL — ABNORMAL HIGH (ref 70–99)
Potassium: 4 mmol/L (ref 3.5–5.1)
Sodium: 136 mmol/L (ref 135–145)
Total Bilirubin: 0.5 mg/dL (ref 0.3–1.2)
Total Protein: 7.3 g/dL (ref 6.5–8.1)

## 2020-03-19 ENCOUNTER — Other Ambulatory Visit: Payer: Self-pay

## 2020-03-19 ENCOUNTER — Ambulatory Visit: Payer: Self-pay | Admitting: Physician Assistant

## 2020-03-19 ENCOUNTER — Encounter: Payer: Self-pay | Admitting: Physician Assistant

## 2020-03-19 VITALS — BP 129/76 | HR 82 | Temp 97.7°F | Wt >= 6400 oz

## 2020-03-19 DIAGNOSIS — E785 Hyperlipidemia, unspecified: Secondary | ICD-10-CM

## 2020-03-19 DIAGNOSIS — Z125 Encounter for screening for malignant neoplasm of prostate: Secondary | ICD-10-CM

## 2020-03-19 DIAGNOSIS — F509 Eating disorder, unspecified: Secondary | ICD-10-CM

## 2020-03-19 DIAGNOSIS — I10 Essential (primary) hypertension: Secondary | ICD-10-CM

## 2020-03-19 DIAGNOSIS — E1165 Type 2 diabetes mellitus with hyperglycemia: Secondary | ICD-10-CM

## 2020-03-19 DIAGNOSIS — Z91199 Patient's noncompliance with other medical treatment and regimen due to unspecified reason: Secondary | ICD-10-CM

## 2020-03-19 DIAGNOSIS — M25562 Pain in left knee: Secondary | ICD-10-CM

## 2020-03-19 DIAGNOSIS — Z6841 Body Mass Index (BMI) 40.0 and over, adult: Secondary | ICD-10-CM

## 2020-03-19 MED ORDER — ATORVASTATIN CALCIUM 20 MG PO TABS: 20.0000 mg | ORAL_TABLET | Freq: Every day | ORAL | 0 refills | Status: DC

## 2020-03-19 MED ORDER — LISINOPRIL 20 MG PO TABS: 40.0000 mg | ORAL_TABLET | Freq: Every day | ORAL | 0 refills | Status: DC

## 2020-03-19 MED ORDER — METFORMIN HCL 1000 MG PO TABS: 1000.0000 mg | ORAL_TABLET | Freq: Two times a day (BID) | ORAL | 0 refills | Status: DC

## 2020-03-19 MED ORDER — AMLODIPINE BESYLATE 5 MG PO TABS: 5.0000 mg | ORAL_TABLET | Freq: Every day | ORAL | 0 refills | Status: DC

## 2020-03-19 MED ORDER — METOPROLOL TARTRATE 100 MG PO TABS: 100.0000 mg | ORAL_TABLET | Freq: Two times a day (BID) | ORAL | 0 refills | Status: DC

## 2020-03-19 NOTE — Progress Notes (Signed)
There were no vitals taken for this visit.   Subjective:    Patient ID: [name], male    DOB: [birth year], 54 y.o.   MRN: [id number]  HPI: [name] is a 54 y.o. male presenting on 03/19/2020 for Hyperlipidemia and Hypertension   HPI   Pt had a negative covid 19 screening questionnaire.    Pt is [name] with morbid obesity, HTN and uncontrolled DM.  [name].   Pt has not gotten covid vaccination.  He says he is still afraid of it.   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   He Says his mood is okay- up and down but okay.  He is still eating "a bit off"; he still sometimes is getting up in middle of night to eat.  He is Not exercising.    Pt is out of a bunch of his meds.  He says he just didn't call to get them refilled.     Relevant past medical, surgical, family and social history reviewed and updated as indicated. Interim medical history since our last visit reviewed. Allergies and medications reviewed and updated.   Current Outpatient Medications:  .  amLODipine (NORVASC) 5 MG tablet, Take 1 tablet (5 mg total) by mouth daily., Disp: 30 tablet, Rfl: 3 .  atorvastatin (LIPITOR) 20 MG tablet, Take 1 tablet (20 mg total) by mouth daily., Disp: 30 tablet, Rfl: 1 .  insulin aspart (NOVOLOG) 100 UNIT/ML injection, Inject 10-16 Units into the skin 3 (three) times daily before meals. Per ssi, Disp: 20 mL, Rfl: 2 .  insulin glargine (LANTUS) 100 UNIT/ML injection, Inject 0.6 mLs (60 Units total) into the skin at bedtime., Disp: 20 mL, Rfl: 2 .  lisinopril (ZESTRIL) 20 MG tablet, Take 2 tablets (40 mg total) by mouth daily., Disp: 60 tablet, Rfl: 1 .  metFORMIN (GLUCOPHAGE) 1000 MG tablet, Take 1 tablet (1,000 mg total) by mouth 2 (two) times daily with a meal., Disp: 180 tablet, Rfl: 1 .  oxyCODONE-acetaminophen (PERCOCET) 10-325 MG tablet, Take 1 tablet by mouth every 4 (four)  hours as needed for pain., Disp: 170 tablet, Rfl: 0 .  metoprolol tartrate (LOPRESSOR) 100 MG tablet, Take 1 tablet (100 mg total) by mouth 2 (two) times daily. (Patient not taking: Reported on 03/19/2020), Disp: 180 tablet, Rfl: 0 .  sitaGLIPtin (JANUVIA) 100 MG tablet, Take 1 tablet (100 mg total) by mouth daily. (Patient not taking: Reported on 03/19/2020), Disp: 90 tablet, Rfl: 0    Review of Systems  Per HPI unless specifically indicated above     Objective:    There were no vitals taken for this visit.  Wt Readings from Last 3 Encounters:  02/26/20 (!) 411 lb (186.4 kg)  12/13/19 (!) 385 lb (174.6 kg)  10/23/19 (!) 414 lb 6.4 oz (188 kg)    Vitals:   03/19/20 1437  BP: 129/76  Pulse: 82  Temp: 97.7 F (36.5 C)  SpO2: 99%   Last Weight  Most recent update: 03/19/2020  2:51 PM   Weight  189.1 kg (417 lb)                Physical Exam Vitals reviewed.  Constitutional:      General: He is not in acute distress.    Appearance: He is well-developed. He is obese.  HENT:     Head: Normocephalic and atraumatic.  Cardiovascular:     Rate and Rhythm: Normal rate and regular rhythm.  Pulmonary:     Effort: Pulmonary effort is normal.     Breath sounds: Normal breath sounds. No wheezing.  Abdominal:     General: Bowel sounds are normal.     Palpations: Abdomen is soft.     Tenderness: There is no abdominal tenderness.  Musculoskeletal:     Cervical back: Neck supple.     Comments: No open wounds on lower extremities.    Lymphadenopathy:     Cervical: No cervical adenopathy.  Skin:    General: Skin is warm and dry.  Neurological:     Mental Status: He is alert and oriented to person, place, and time.  Psychiatric:        Behavior: Behavior normal.     Results for orders placed or performed during the hospital encounter of 03/17/20  Lipid panel  Result Value Ref Range   Cholesterol 205 (H) 0 - 200 mg/dL   Triglycerides 100 <150 mg/dL   HDL 41 >40 mg/dL    Total CHOL/HDL Ratio 5.0 RATIO   VLDL 20 0 - 40 mg/dL   LDL Cholesterol 144 (H) 0 - 99 mg/dL  Comprehensive metabolic panel  Result Value Ref Range   Sodium 136 135 - 145 mmol/L   Potassium 4.0 3.5 - 5.1 mmol/L   Chloride 103 98 - 111 mmol/L   CO2 26 22 - 32 mmol/L   Glucose, Bld 183 (H) 70 - 99 mg/dL   BUN 8 6 - 20 mg/dL   Creatinine, Ser 0.55 (L) 0.61 - 1.24 mg/dL   Calcium 8.6 (L) 8.9 - 10.3 mg/dL   Total Protein 7.3 6.5 - 8.1 g/dL   Albumin 3.1 (L) 3.5 - 5.0 g/dL   AST 14 (L) 15 - 41 U/L   ALT 18 0 - 44 U/L   Alkaline Phosphatase 88 38 - 126 U/L   Total Bilirubin 0.5 0.3 - 1.2 mg/dL   GFR calc non Af Amer >60 >60 mL/min   GFR calc Af Amer >60 >60 mL/min   Anion gap 7 5 - 15      Assessment & Plan:    Encounter Diagnoses  Name Primary?  . Essential hypertension Yes  . Uncontrolled type 2 diabetes mellitus with hyperglycemia ([place])   . Hyperlipidemia, unspecified hyperlipidemia type   . Morbid obesity ([place])   . BMI 60.0-69.9, adult ([place])   . Eating disorder, unspecified type   . Personal history of noncompliance with medical treatment, presenting hazards to health   . Chronic pain of both knees       -Reviewed labs with pt.  a1c not drawn as he is supposed to be seeing endocrinology for management of his diabetes.  Discussed with pt that he needs to get back there and he agrees.   -Talked at length about covid vaccination and risks/benefits.  Discussed with pt that covid infections are on the rise again and with his medical conditions, he is at very high risk of difficulty should he get the virus.  He states understanding. -encouraged pt to avoid running out of his medications. -encouraged pt to exercise regularly.  Discussed options since he is limited by his knee pain and his morbid obesity -pt to follow up  23months.  He is to contact office sooner for any problems.

## 2020-03-21 NOTE — Congregational Nurse Program (Signed)
Review of client's last 7 days of uploaded recordings with the remote monitoring program for hypertension with [name] program in collaboration with [facility]. Client has been consistent in meeting goals of monitoring per program guidelines.  Last 7 day averages of uploaded blood pressure recordings are as follows:  Last 7 day Average recorded blood pressure: 133/83 on target.  Last 7 day average highest recorded reading: 154/97  Last 7 day average lowest recorded reading: 117/86  Plan:   RN will continue to review daily readings within [facility] and note any escalations or readings that would require an intervention based on program guidelines.  Will continue to meet weekly with [name] to discuss client's progress and any noted barriers to completing program and personal goals and develop patient centered solutions to any noted barriers.  Client will continue weekly and as needed health Coaching with [place]

## 2020-03-25 ENCOUNTER — Other Ambulatory Visit: Payer: Self-pay

## 2020-03-25 ENCOUNTER — Encounter: Payer: Self-pay | Admitting: Orthopaedic Surgery

## 2020-03-25 ENCOUNTER — Ambulatory Visit: Payer: Self-pay | Admitting: Orthopaedic Surgery

## 2020-03-25 VITALS — BP 133/91 | HR 85 | Ht 68.0 in | Wt 386.0 lb

## 2020-03-25 DIAGNOSIS — G8929 Other chronic pain: Secondary | ICD-10-CM

## 2020-03-25 DIAGNOSIS — M25562 Pain in left knee: Secondary | ICD-10-CM

## 2020-03-25 DIAGNOSIS — Z6841 Body Mass Index (BMI) 40.0 and over, adult: Secondary | ICD-10-CM

## 2020-03-25 DIAGNOSIS — M25561 Pain in right knee: Secondary | ICD-10-CM

## 2020-03-25 MED ORDER — OXYCODONE-ACETAMINOPHEN 10-325 MG PO TABS: 1.0000 | ORAL_TABLET | ORAL | 0 refills | Status: DC | PRN

## 2020-03-25 NOTE — Progress Notes (Signed)
Patient [name], male DOB:07/03/1966, 54 y.o. SJG:[id number]  Chief Complaint  Patient presents with  . Knee Pain    Bilateral/ hurting     HPI  [name] is a 54 y.o. male who has bilateral knee pain with swelling and popping.  He has no giving way, no trauma.  He is taking his medicine.  His weight is stable.  He has good and bad days. The left knee hurts more.   Body mass index is 58.69 kg/m.  The patient meets the AMA guidelines for Morbid (severe) obesity with a BMI > 40.0 and I have recommended weight loss.   ROS  Review of Systems  Constitutional: Positive for activity change.  HENT: Negative for congestion.   Respiratory: Positive for shortness of breath. Negative for cough.   Cardiovascular: Positive for leg swelling. Negative for chest pain.  Endocrine: Negative for cold intolerance.  Musculoskeletal: Positive for arthralgias, back pain, gait problem, joint swelling and myalgias.  Allergic/Immunologic: Positive for environmental allergies.  Neurological: Negative for numbness.  All other systems reviewed and are negative.   All other systems reviewed and are negative.  The following is a summary of the past history medically, past history surgically, known current medicines, social history and family history.  This information is gathered electronically by the computer from prior information and documentation.  I review this each visit and have found including this information at this point in the chart is beneficial and informative.    Past Medical History:  Diagnosis Date  . Arthritis   . Diabetes mellitus without complication ([place])    diagnosed about age 17  . Gout   . Hypertension    boarderline    Past Surgical History:  Procedure Laterality Date  . HERNIA REPAIR  2008  . MASS EXCISION Left age 37   L thigh- benign    Family History  Problem Relation Age of Onset  . Cancer Mother   . Diabetes Mother   . Heart failure Father   .  Hypertension Father   . Diabetes Father   . Heart attack Father     Social History Social History   Tobacco Use  . Smoking status: Former Smoker    Packs/day: 0.50    Years: 5.00    Pack years: 2.50    Types: Cigarettes    Quit date: 1990    Years since quitting: 31.5  . Smokeless tobacco: Never Used  Vaping Use  . Vaping Use: Never used  Substance Use Topics  . Alcohol use: No  . Drug use: No    No Known Allergies  Current Outpatient Medications  Medication Sig Dispense Refill  . amLODipine (NORVASC) 5 MG tablet Take 1 tablet (5 mg total) by mouth daily. 30 tablet 0  . atorvastatin (LIPITOR) 20 MG tablet Take 1 tablet (20 mg total) by mouth daily. 30 tablet 0  . insulin aspart (NOVOLOG) 100 UNIT/ML injection Inject 10-16 Units into the skin 3 (three) times daily before meals. Per ssi 20 mL 2  . insulin glargine (LANTUS) 100 UNIT/ML injection Inject 0.6 mLs (60 Units total) into the skin at bedtime. 20 mL 2  . lisinopril (ZESTRIL) 20 MG tablet Take 2 tablets (40 mg total) by mouth daily. 60 tablet 0  . metFORMIN (GLUCOPHAGE) 1000 MG tablet Take 1 tablet (1,000 mg total) by mouth 2 (two) times daily with a meal. 60 tablet 0  . metoprolol tartrate (LOPRESSOR) 100 MG tablet Take 1 tablet (100  mg total) by mouth 2 (two) times daily. 60 tablet 0  . oxyCODONE-acetaminophen (PERCOCET) 10-325 MG tablet Take 1 tablet by mouth every 4 (four) hours as needed for pain. 170 tablet 0  . sitaGLIPtin (JANUVIA) 100 MG tablet Take 1 tablet (100 mg total) by mouth daily. (Patient not taking: Reported on 03/19/2020) 90 tablet 0   No current facility-administered medications for this visit.     Physical Exam  Blood pressure (!) 133/91, pulse 85, height 5\' 8"  (1.727 m), weight (!) 386 lb (175.1 kg).  Constitutional: overall normal hygiene, normal nutrition, well developed, normal grooming, normal body habitus. Assistive device:none  Musculoskeletal: gait and station Limp left, muscle tone  and strength are normal, no tremors or atrophy is present.  .  Neurological: coordination overall normal.  Deep tendon reflex/nerve stretch intact.  Sensation normal.  Cranial nerves II-XII intact.   Skin:   Normal overall no scars, lesions, ulcers or rashes. No psoriasis.  Psychiatric: Alert and oriented x 3.  Recent memory intact, remote memory unclear.  Normal mood and affect. Well groomed.  Good eye contact.  Cardiovascular: overall no swelling, no varicosities, no edema bilaterally, normal temperatures of the legs and arms, no clubbing, cyanosis and good capillary refill.  Lymphatic: palpation is normal.  Both knees are tender, ROM right 0 to 110, left 0 to 105, limp left, crepitus, effusions.  NV intact.  All other systems reviewed and are negative   The patient has been educated about the nature of the problem(s) and counseled on treatment options.  The patient appeared to understand what I have discussed and is in agreement with it.  Encounter Diagnoses  Name Primary?  . Chronic pain of right knee Yes  . Chronic pain of left knee   . Morbid obesity due to excess calories ([place])   . Body mass index 60.0-69.9, adult [facility])     PLAN Call if any problems.  Precautions discussed.  Continue current medications.   Return to clinic 3 months   I have reviewed the [facility] web site prior to prescribing narcotic medicine for this patient.   Electronically Signed Sanjuana Kava, MD 7/20/20213:44 PM

## 2020-04-01 NOTE — Congregational Nurse Program (Signed)
Last 7 day averages from [name] remote patient monitoring program in [facility] with [facility].   Last 7 day average recorded readings:  Last 7 day average reading: 127/79 on target (14 readings in 7 days)  Last 7 day highest average recorded reading: 140/83  Last 7 day lowest average recorded reading: 110/71.   Plan: Client will continue to receive health coaching for hypertension and Dash diet and exercise by [name] as per program guidelines.  RN will continue to monitor readings each day for trending escalations or alerts that would require intervention. RN is also calling client weekly to discuss Diabetic education, diet and lifestyle changes as well as blood sugar logs and exercise progress. Client is somewhat limited with some exercise due to bilateral knee pain. Client was given chair exercises with videos to begin and client reports he is doing those. Client to pick up resistance band and hand weights from the office. Client is also to call and make an appointment to get back into Dr. Liliane [facility] as well as Jari Favre.  RN and Marnee Spring LPN will continue to meet weekly to discuss client's progress with goals and assess any barriers to client meeting goals.  [name] RN  [facility].

## 2020-04-09 NOTE — Congregational Nurse Program (Signed)
  Dept: [phone]   Congregational Nurse Program Note  Date of Encounter: 04/01/2020  Past Medical History: Past Medical History:  Diagnosis Date  . Arthritis   . Diabetes mellitus without complication ([place])    diagnosed about age 54  . Gout   . Hypertension    boarderline    Encounter Details:   Remote patient monitoring follow up with last 7 days blood pressure readings averages. Client has been consistent with monitoring.  Last 7 day reported readings: 12 total readings  Average recorded reading reported: 133/81 on target  Highest recorded reading last 7 day reported: 145/85  Lowest recorded reading last 7 days reported: 115/74   Plan: RN will continue to monitor dashboard daily during business hours for recorded readings and note any escalations or upward trends and report to provider if interventions are needed.  Client will continue with weekly health coaching sessions with [name].  RN and Marnee Spring LPN will continue to meet weekly to discuss client's progress and address any potential barriers to client meeting goals.

## 2020-04-11 NOTE — Congregational Nurse Program (Signed)
Last 7 day averages of remote patient monitoring program [facility] in collaboration with [facility]. Client has been consistent monitoring per program guidelines.   Last 7 day averages recorded blood pressures:  Average blood pressure last 7 day recordings: 131/81 on target  Average highest blood pressure last 7 day recordings: 155/94  Average lowest blood pressure last 7 day recordings: 109/73    Plan:  RN will continue to monitor reported blood pressure readings recorded daily during clinic hours for any escalations or trends that would require intervention per program guidelines.  Client will continue with weekly health coaching by [name]   RN will continue to meet weekly with [name] LPN to discuss client's progress and any potential barriers to meeting goals of personal action plan.  Mayra Reel RN  [name]

## 2020-04-22 NOTE — Congregational Nurse Program (Signed)
[name] Remote patient monitoring program for hypertension in collaboration with [facility].  Client is consistently meeting monitoring goals per program guidelines and personal action plan.  Client is working to incorporate lifestyle changes such as diet (DASH and Diabetic diet), increasing activity, and medication adherence.   Last 7 day averages of reported readings:  Average for last 7 day reported readings: 126/83 on target to goal. Average highest reported reading last 7 days: 149/89 Average lowest reported reading last 7 days: 114/79   Plan:  RN will continue to monitor daily readings for escalations and trends of blood pressure that would require possible intervention.  Client will continue to follow up weekly for health coaching and support with [name]  Client will continue to monitor blood sugars as directed by provider and keep logs client has also followed recommendation and now has a scheduled appointment to meet with Dr. Dorris Fetch for his diabetes 05/13/20 at 2:30PM  RN and W. Pauline Good LPN will continue to meet weekly to discuss client's progress and any potential barriers to his meeting goals.  [name] RN  [name]

## 2020-04-23 ENCOUNTER — Telehealth: Payer: Self-pay | Admitting: Orthopaedic Surgery

## 2020-04-23 NOTE — Telephone Encounter (Signed)
Patient requests refill on Oxycodone/Acetaminophen 10-325 mgs.  Qty  170  Sig: Take 1 tablet by mouth every 4 (four) hours as needed for pain.  Patient states he uses [facility] in [place]

## 2020-04-24 ENCOUNTER — Telehealth: Payer: Self-pay

## 2020-04-24 MED ORDER — OXYCODONE-ACETAMINOPHEN 10-325 MG PO TABS: 1.0000 | ORAL_TABLET | ORAL | 0 refills | Status: DC | PRN

## 2020-04-24 NOTE — Telephone Encounter (Signed)
Pt was contacted by phone call on today (03/20/20/) to get a Weekly update on his successes/barriers/any revisions as it relates to the remote monitoring hypertension program     Review his success of staying within his BP Goal of 135/85  Pt was reminded about program goals and requirements related to obtaining 12 readings   Patient continues to feel confident about use of equipment for BP and weight checks  Successes -Continues to work on eating baked foods and limiting the use of salt and or using more salt subsitutues  -Confidence of use of equipment  Barriers -Limited exercise outside of chores due knee problems.  Action plan -work on keep up with movement by getting a pedometer  -continue to monitor BP and weight as outlines per program requirements

## 2020-04-24 NOTE — Telephone Encounter (Signed)
Pt was contacted by phone call on today to get a Weekly update on his successes/barriers/any revisions as it relates to the remote monitoring hypertension program.   Reviewed his success of staying within his BP Goal of 135/85  Pt was reminded about improving on bp monitoring and conducting more bp checks in the PM.  Was reminded that the p.m. readings must at be conducted at a mininum 12 readings/ week in order to stay within the program requirements.    Pt states he continues to watch his diet as far as his portion sizes,continues to try and utilizes salt substitutes when he cooks as far as watching sodium intake.    States no problems with use of equipment for BP and weight checks but realizes he has to revise his schedule to get in more bp checks  Barriers -Time management eating later than normal due to working) which also causes him not to ge   -Been eating out more which has affected possible increase of salt intake  Successes  -Been moving more than with normal activity since working (lifting, walking, painting, stretching)  -Getting up early than ?normal to do weight and bp checks  Action Plan  -Make better choices when eating out  -Get more BP checks in on one day until reach goal of 12 readings/week  by working on  time management

## 2020-04-24 NOTE — Telephone Encounter (Signed)
Pt was contacted by phone call on today (04/01/20/) to get a Weekly update on his successes/barriers/any revisions as it relates to the remote monitoring hypertension program     Review his success of staying within his BP Goal of 135/85  Pt was reminded about program goals and requirements for blood presssue and weight checks   Patient continues to feel confident about use of equipment for BP and weight checks  Successes -Continues to work on eating baked foods and limiting the use of salt and or using more salt subsitutues -Confidence of use of equipment  Barriers -Limited exercise outside of chores due knee problems.  Action plan -continue to monitor BP and weight as outlined per program requirements

## 2020-04-24 NOTE — Telephone Encounter (Signed)
Pt was contacted by phone call on today (04/15/20/) to get a Weekly update on his successes/barriers/any revisions as it relates to the remote monitoring hypertension program     Review his success of staying within his BP Goal of 135/85  Pt was reminded about improving on bp monitoring and conducting more bp checks in the PM.  Was reminded that the p.m. readings must at mininum 12 in order to stay within the program requirements.    Overall he states he is doing well and feels confident about use of equipment for BP and weight checks  Successes -Continues to watch his diet as far as his portion sizes -utilizes salt substitutes as far as watching sodium intake and bake foods more so when cooking.   -Continues to conduct some exercise activity by way of household chores indoors and outdoors of the house  Barriers- -Limited physical activity (no equipment purchases for exercises yet made)  States he is planning to get the weights stretch exercise bands from RN Nurse Case Manager, Mayra Reel, soon   -Fall asleep being overly tired which comes in the midst of not being able to get enough PM readings to equal up to 12 readings   Action Plan -work on time management as relates to getting in 12 readings

## 2020-04-25 NOTE — Congregational Nurse Program (Signed)
Remote patient monitoring program [name] for hypertension in collaboration with [facility] [place]. Client is consistently monitoring per program guidelines.    Last 7 day average of reported blood pressure readings  Last 7 day average reported readings: 130/84 at target Last 7 day average highest reported: 148/92 Last 7 day average lowest reported: 116/78    Plan:   RN will monitor client's reported blood pressure on the welch allyn platform daily during clinic hours per program guidelines to note any escalations or trends requiring intervention.  Client will continue to have weekly sessions of health coaching and support from [facility] LPN and discuss any changes to personal action plans.  RN will continue to meet weekly with [name] LPN to discuss client's progress and any potential barriers to meeting goals and addressing any barriers.  [name] RN  [facility].

## 2020-04-29 ENCOUNTER — Telehealth: Payer: Self-pay | Admitting: Student

## 2020-04-29 NOTE — Telephone Encounter (Signed)
Pt called stating he is out of novolog and is requesting Novolog samples from our office.   Pt has not submitted Medicaid Denial Letter for completion of Novo Nordisk PAP where he can get free supply of Novolog (last application submitted was 2020) pt must complete new application for enrollment.  [facility] is unable to provide samples of Novolog to patient at this time. Pt was counseled on using OTC Novolin Regular sliding scale 6-10 units until he is able to submit proper paperwork for completion of Novo Nordisk PAP enrollment. Pt verbalized understanding.

## 2020-05-05 NOTE — Congregational Nurse Program (Signed)
[name] remote patient monitoring program for hypertension in [facility]. Client's last 7day averages for remote monitoring.   Last 7 day average of recorded readings:  Last 7 day average : 132/83 Last 7 day highest recorded reading: 151/97 Last 7 day lowest recorded reading: 111/72  Plan: RN will continue to monitor dashboard of welch allyn daily for blood pressure readings during clinic hours as per program guidelines to note any escalations or trends requiring interventions.  Client will continue to receive health coaching as scheduled with [name] for support and any changes of personal action plan.  RN will continue to meet weekly and discuss client's progress and address any barriers to meeting goals.

## 2020-05-08 ENCOUNTER — Telehealth: Payer: Self-pay

## 2020-05-08 NOTE — Telephone Encounter (Signed)
Conversation notes were recorded on 05/01/20 by phone call to  get a Weekly update on his successes/barriers/any revisions as it relates to the remote monitoring hypertension program.   States no problems with use of equipment for BP and weight checks   Barriers -Struggles with Time management for pm bp readings submission  -lack of energy/motivation after working to complete PM measurements   Successes  -Exercising and moving more in the daytime that includes walking consistently  on the job for about 1 hour _Eating fresh fruits and vegetables, baking, grilling and boiling foods vs frying  Action Plan   -Attempt to conduct more PM bp readings in evening -Purchase/obtain a pedometer or fit bit to keep track of steps more

## 2020-05-13 ENCOUNTER — Ambulatory Visit: Payer: Self-pay | Admitting: Nurse Practitioner

## 2020-05-21 ENCOUNTER — Other Ambulatory Visit: Payer: Self-pay

## 2020-05-21 ENCOUNTER — Ambulatory Visit (INDEPENDENT_AMBULATORY_CARE_PROVIDER_SITE_OTHER): Payer: Self-pay | Admitting: Nurse Practitioner

## 2020-05-21 ENCOUNTER — Other Ambulatory Visit: Payer: Self-pay | Admitting: Orthopaedic Surgery

## 2020-05-21 ENCOUNTER — Encounter: Payer: Self-pay | Admitting: Nurse Practitioner

## 2020-05-21 VITALS — BP 144/82 | HR 74 | Ht 68.0 in | Wt >= 6400 oz

## 2020-05-21 DIAGNOSIS — I10 Essential (primary) hypertension: Secondary | ICD-10-CM

## 2020-05-21 DIAGNOSIS — E1165 Type 2 diabetes mellitus with hyperglycemia: Secondary | ICD-10-CM

## 2020-05-21 DIAGNOSIS — E782 Mixed hyperlipidemia: Secondary | ICD-10-CM

## 2020-05-21 LAB — POCT GLYCOSYLATED HEMOGLOBIN (HGB A1C): Hemoglobin A1C: 10.4 % — AB (ref 4.0–5.6)

## 2020-05-21 MED ORDER — METFORMIN HCL 1000 MG PO TABS
1000.0000 mg | ORAL_TABLET | Freq: Two times a day (BID) | ORAL | 3 refills | Status: DC
Start: 2020-05-21 — End: 2020-06-25

## 2020-05-21 MED ORDER — INSULIN REGULAR HUMAN 100 UNIT/ML IJ SOLN: 10.0000 [IU] | Freq: Three times a day (TID) | INTRAMUSCULAR | 3 refills | Status: DC

## 2020-05-21 MED ORDER — INSULIN GLARGINE 100 UNIT/ML ~~LOC~~ SOLN: 70.0000 [IU] | Freq: Every day | SUBCUTANEOUS | 3 refills | Status: DC

## 2020-05-21 NOTE — Progress Notes (Signed)
05/21/2020, 3:11 PM      Endocrinology Follow Up Visit  Subjective:    Patient ID: [name], male    DOB: 26-Dec-1965.  [name] is being seen in follow up for management of currently uncontrolled symptomatic diabetes requested by  [name], PA-C.   Past Medical History:  Diagnosis Date  . Arthritis   . Diabetes mellitus without complication ([place])    diagnosed about age 54  . Gout   . Hypertension    boarderline    Past Surgical History:  Procedure Laterality Date  . HERNIA REPAIR  2008  . MASS EXCISION Left age 24   L thigh- benign    Social History   Socioeconomic History  . Marital status: Single    Spouse name: Not on file  . Number of children: Not on file  . Years of education: Not on file  . Highest education level: Not on file  Occupational History  . Not on file  Tobacco Use  . Smoking status: Former Smoker    Packs/day: 0.50    Years: 5.00    Pack years: 2.50    Types: Cigarettes    Quit date: 1990    Years since quitting: 31.7  . Smokeless tobacco: Never Used  Vaping Use  . Vaping Use: Never used  Substance and Sexual Activity  . Alcohol use: No  . Drug use: No  . Sexual activity: Not on file  Other Topics Concern  . Not on file  Social History Narrative  . Not on file   Social Determinants of Health   Financial Resource Strain:   . Difficulty of Paying Living Expenses: Not on file  Food Insecurity:   . Worried About Charity fundraiser in the Last Year: Not on file  . Ran Out of Food in the Last Year: Not on file  Transportation Needs:   . Lack of Transportation (Medical): Not on file  . Lack of Transportation (Non-Medical): Not on file  Physical Activity:   . Days of Exercise per Week: Not on file  . Minutes of Exercise per Session: Not on file  Stress:   . Feeling of Stress : Not on file  Social Connections:   . Frequency of Communication with  Friends and Family: Not on file  . Frequency of Social Gatherings with Friends and Family: Not on file  . Attends Religious Services: Not on file  . Active Member of Clubs or Organizations: Not on file  . Attends Archivist Meetings: Not on file  . Marital Status: Not on file    Family History  Problem Relation Age of Onset  . Cancer Mother   . Diabetes Mother   . Heart failure Father   . Hypertension Father   . Diabetes Father   . Heart attack Father     Outpatient Encounter Medications as of 05/21/2020  Medication Sig  . amLODipine (NORVASC) 5 MG tablet Take 1 tablet (5 mg total) by mouth daily.  [name] atorvastatin (LIPITOR) 20 MG tablet Take 1 tablet (20 mg total) by mouth daily.  . insulin aspart (NOVOLOG) 100 UNIT/ML injection Inject 10-16 Units into the skin 3 (three) times  daily before meals. Per ssi  . insulin glargine (LANTUS) 100 UNIT/ML injection Inject 0.6 mLs (60 Units total) into the skin at bedtime.  [name] lisinopril (ZESTRIL) 20 MG tablet Take 2 tablets (40 mg total) by mouth daily.  . metFORMIN (GLUCOPHAGE) 500 MG tablet Take 500 mg by mouth 2 (two) times daily.  . metoprolol tartrate (LOPRESSOR) 100 MG tablet Take 1 tablet (100 mg total) by mouth 2 (two) times daily.  [name] oxyCODONE-acetaminophen (PERCOCET) 10-325 MG tablet Take 1 tablet by mouth every 4 (four) hours as needed for pain.  . sitaGLIPtin (JANUVIA) 100 MG tablet Take 1 tablet (100 mg total) by mouth daily.  . [DISCONTINUED] metFORMIN (GLUCOPHAGE) 1000 MG tablet Take 1 tablet (1,000 mg total) by mouth 2 (two) times daily with a meal.   No facility-administered encounter medications on file as of 05/21/2020.    ALLERGIES: No Known Allergies  VACCINATION STATUS: Immunization History  Administered Date(s) Administered  . Influenza,inj,Quad PF,6+ Mos 06/30/2017    Diabetes He presents for his follow-up diabetic visit. He has type 2 diabetes mellitus. Onset time: He was diagnosed at approximate age of  18 years. His disease course has been improving. There are no hypoglycemic associated symptoms. Pertinent negatives for hypoglycemia include no confusion, headaches, pallor or seizures. Associated symptoms include blurred vision, fatigue, polydipsia, polyphagia and polyuria. Pertinent negatives for diabetes include no chest pain and no weakness. There are no hypoglycemic complications. Symptoms are improving. There are no diabetic complications. Risk factors for coronary artery disease include dyslipidemia, diabetes mellitus, hypertension, male sex, obesity and sedentary lifestyle. Current diabetic treatment includes intensive insulin program and oral agent (monotherapy). He is compliant with treatment some of the time (has difficulty affording medication). His weight is increasing rapidly. He is following a generally unhealthy diet. When asked about meal planning, he reported none. He has not had a previous visit with a dietitian. He never participates in exercise. His home blood glucose trend is decreasing steadily. His breakfast blood glucose range is generally >200 mg/dl. His lunch blood glucose range is generally >200 mg/dl. His dinner blood glucose range is generally >200 mg/dl. His bedtime blood glucose range is generally >200 mg/dl. His overall blood glucose range is >200 mg/dl. (He presents today without his meter or logs to review.  He reports his lowest blood glucose reading was around 116 and his highest reading was 400+.  His POCT A1C today is 10.4%, improving slightly from last visit of 10.9%.  He states he has not been able to afford his Novolog insulin, therefore has not taken it in the last 2-3 months.  He denies any episodes of hypoglycemia. ) An ACE inhibitor/angiotensin II receptor blocker is being taken. He does not see a podiatrist.Eye exam is current.  Hyperlipidemia This is a chronic problem. The current episode started more than 1 year ago. The problem is controlled. Recent lipid tests  were reviewed and are normal. Exacerbating diseases include diabetes and obesity. Factors aggravating his hyperlipidemia include beta blockers and fatty foods. Pertinent negatives include no chest pain, myalgias or shortness of breath. Current antihyperlipidemic treatment includes statins. The current treatment provides moderate improvement of lipids. Compliance problems include adherence to diet, adherence to exercise and medication cost.  Risk factors for coronary artery disease include dyslipidemia, diabetes mellitus, family history, hypertension, male sex, obesity and a sedentary lifestyle.  Hypertension This is a chronic problem. The current episode started more than 1 year ago. Associated symptoms include blurred vision and malaise/fatigue. Pertinent negatives include  no chest pain, headaches, neck pain, palpitations or shortness of breath. Risk factors for coronary artery disease include dyslipidemia, diabetes mellitus, male gender, obesity and sedentary lifestyle. Past treatments include ACE inhibitors and beta blockers. The current treatment provides mild improvement. Compliance problems include medication cost, diet and exercise.     Review of systems: Limited as above. Review of Systems  Constitutional: Positive for fatigue and malaise/fatigue.  Eyes: Positive for blurred vision.  Respiratory: Negative for shortness of breath.   Cardiovascular: Negative for chest pain, palpitations and leg swelling.  Gastrointestinal: Negative for constipation and diarrhea.  Musculoskeletal: Negative for myalgias and neck pain.  Skin: Negative for pallor.  Neurological: Negative for seizures, weakness and headaches.  Endo/Heme/Allergies: Positive for polydipsia and polyphagia.  Psychiatric/Behavioral: Negative for confusion.     Objective:    BP (!) 144/82 (BP Location: Left Arm, Patient Position: Sitting)   Pulse 74   Ht 5\' 8"  (1.727 m)   Wt (!) 422 lb (191.4 kg)   BMI 64.16 kg/m   Wt  Readings from Last 3 Encounters:  05/21/20 (!) 422 lb (191.4 kg)  03/25/20 (!) 386 lb (175.1 kg)  03/19/20 (!) 417 lb (189.1 kg)    BP Readings from Last 3 Encounters:  05/21/20 (!) 144/82  03/25/20 (!) 133/91  03/19/20 129/76     Physical Exam- Limited  Constitutional:  Body mass index is 64.16 kg/m. , not in acute distress, normal state of mind Eyes:  EOMI, no exophthalmos Neck: Supple Thyroid: No gross goiter Cardiovascular: RRR, no murmers, rubs, or gallops, no edema Respiratory: Adequate breathing efforts, no crackles, rales, rhonchi, or wheezing Musculoskeletal: no gross deformities, strength intact in all four extremities, no gross restriction of joint movements Skin:  no rashes, no hyperemia Neurological: no tremor with outstretched hands  CMP ( most recent) CMP     Component Value Date/Time   NA 136 03/17/2020 1200   K 4.0 03/17/2020 1200   CL 103 03/17/2020 1200   CO2 26 03/17/2020 1200   GLUCOSE 183 (H) 03/17/2020 1200   BUN 8 03/17/2020 1200   CREATININE 0.55 (L) 03/17/2020 1200   CREATININE 1.42 (H) 09/08/2017 1540   CALCIUM 8.6 (L) 03/17/2020 1200   PROT 7.3 03/17/2020 1200   ALBUMIN 3.1 (L) 03/17/2020 1200   AST 14 (L) 03/17/2020 1200   ALT 18 03/17/2020 1200   ALKPHOS 88 03/17/2020 1200   BILITOT 0.5 03/17/2020 1200   GFRNONAA >60 03/17/2020 1200   GFRAA >60 03/17/2020 1200     Diabetic Labs (most recent): Lab Results  Component Value Date   HGBA1C 10.9 (H) 10/05/2019   HGBA1C 10.0 (H) 07/02/2019   HGBA1C 10.2 (H) 03/29/2019     Lipid Panel ( most recent) Lipid Panel     Component Value Date/Time   CHOL 205 (H) 03/17/2020 1200   TRIG 100 03/17/2020 1200   HDL 41 03/17/2020 1200   CHOLHDL 5.0 03/17/2020 1200   VLDL 20 03/17/2020 1200   LDLCALC 144 (H) 03/17/2020 1200      Lab Results  Component Value Date   TSH 0.532 06/28/2017       Assessment & Plan:   1. Uncontrolled type 2 diabetes mellitus with hyperglycemia ([facility])  -  [name] has currently uncontrolled symptomatic type 2 DM since  54 years of age.  He presents today without his meter or logs to review.  He reports his lowest blood glucose reading was around 116 and his highest reading was 400+.  His POCT A1C today is 10.4%, improving slightly from last visit of 10.9%.  He does not have insurance and has been getting his meds through [facility] patient assistance program.  He states he has not been able to afford his Novolog insulin, therefore has not taken it in the last 2-3 months.  He denies any episodes of hypoglycemia.  He had only been taking 500 mg of metformin instead of the recommended 1000 mg due to confusion over which dose he was supposed to take.  -Recent labs reviewed.  - I had a long discussion with him about the progressive nature of diabetes and the pathology behind its complications. -his diabetes is complicated by obesity/sedentary life, inadequate insurance coverage,  And he remains at a high risk for more acute and chronic complications which include CAD, CVA, CKD, retinopathy, and neuropathy. These are all discussed in detail with him.  - The patient admits there is a room for improvement in their diet and drink choices. -  Suggestion is made for the patient to avoid simple carbohydrates from their diet including Cakes, Sweet Desserts / Pastries, Ice Cream, Soda (diet and regular), Sweet Tea, Candies, Chips, Cookies, Sweet Pastries,  Store Bought Juices, Alcohol in Excess of  1-2 drinks a day, Artificial Sweeteners, Coffee Creamer, and "Sugar-free" Products. This will help patient to have stable blood glucose profile and potentially avoid unintended weight gain.   - I encouraged the patient to switch to  unprocessed or minimally processed complex starch and increased protein intake (animal or plant source), fruits, and vegetables.   - Patient is advised to stick to a routine mealtimes to eat 3 meals  a day and avoid unnecessary snacks ( to snack  only to correct hypoglycemia).  - he will be scheduled with Jearld Fenton, RDN, CDE for diabetes education.  - I have approached him with the following individualized plan to manage  his diabetes and patient agrees:   -Given his significant glycemic burden, he will continue to need intensive treatment with basal/bolus insulin in order for him to achieve control of diabetes to target.  He is asking for a cheaper alternative to novolog as he has not been able to afford it.  He is encouraged to retry applying for Medicaid given his health issues, to help with medical costs.  -He is advised to increase his Lantus to 70 units SQ daily at bedtime, will switch to Novolin R 10-13 units TID with meals if blood glucose above 90 and he is eating based on patient specific sliding scale.  He is to increase his Metformin to 1000 mg po twice daily with meals.  He is advised to continue Januvia 100 mg po daily as well (if he can afford it).  He is encouraged to monitor blood glucose 4 times per day, before meals and at bedtime and report to the clinic if readings are less than 70 or greater than 300 for 3 tests in a row.  - he will be considered for GLP-1 receptor agonist instead of DPP 4 inhibitor  as appropriate next visit.  - Specific targets for  A1c;  LDL, HDL,  and Triglycerides were discussed with the patient.  2) Blood Pressure /Hypertension:   His blood pressure is not controlled to target.  He is advised to continue Lisinopril 20 mg po daily, and Metoprolol 100 mg po twice daily.  Will consider dose change on next visit if BP remains elevated.  3) Lipids/Hyperlipidemia:   His recent lipid panel from  03/17/20 shows uncontrolled LDL of 144.  He is advised to continue Atorvastatin 20 mg po daily at bedtime.  Side effects and precautions discussed with him.  May consider increasing dose if LDL remains elevated after next lipid panel.  4)  Weight/Diet:  His Body mass index is 64.16 kg/m.-   clearly  complicating his diabetes care.   he is  a candidate for modest weight loss. I discussed with him the fact that loss of 5 - 10% of his  current body weight will have the most impact on his diabetes management.  Exercise, and detailed carbohydrates information provided  -  detailed on discharge instructions.  He is a good candidate for bariatric surgery, however at this point he has no insurance.  5) Chronic Care/Health Maintenance: -he  is on ACEI/ARB and Statin medications and  is encouraged to initiate and continue to follow up with Ophthalmology, Dentist,  Podiatrist at least yearly or according to recommendations, and advised to   stay away from smoking. I have recommended yearly flu vaccine and pneumonia vaccine at least every 5 years; moderate intensity exercise for up to 150 minutes weekly; and  sleep for at least 7 hours a day.  - he is  advised to maintain close follow up with [name], PA-C for primary care needs, as well as his other providers for optimal and coordinated care.   - Time spent on this patient care encounter:  35 min, of which > 50% was spent in  counseling and the rest reviewing his blood glucose logs , discussing his hypoglycemia and hyperglycemia episodes, reviewing his current and  previous labs / studies  ( including abstraction from other facilities) and medications  doses and developing a  long term treatment plan and documenting his care.   Please refer to Patient Instructions for Blood Glucose Monitoring and Insulin/Medications Dosing Guide"  in media tab for additional information. Please  also refer to " Patient Self Inventory" in the Media  tab for reviewed elements of pertinent patient history.  [name] participated in the discussions, expressed understanding, and voiced agreement with the above plans.  All questions were answered to his satisfaction. he is encouraged to contact clinic should he have any questions or concerns prior to his return  visit.   Follow up plan: - Return in about 4 months (around 09/20/2020) for Diabetes follow up, Previsit labs.  Rayetta Pigg, FNP-BC [facility] Phone: [phone] Fax: [phone]   05/21/2020, 3:11 PM  This note was partially dictated with voice recognition software. Similar sounding words can be transcribed inadequately or may not  be corrected upon review.

## 2020-05-21 NOTE — Patient Instructions (Signed)

## 2020-05-21 NOTE — Telephone Encounter (Signed)
Patient is calling to get a refill of his Oxycodone.  It was last filled on 04/24/20.   [facility] [place]. Please advise.

## 2020-05-22 MED ORDER — OXYCODONE-ACETAMINOPHEN 10-325 MG PO TABS: 1.0000 | ORAL_TABLET | ORAL | 0 refills | Status: DC | PRN

## 2020-05-29 ENCOUNTER — Telehealth: Payer: Self-pay | Admitting: Student

## 2020-05-29 NOTE — Telephone Encounter (Signed)
LPN called pt to f/u on whether he had applied and received medicaid denial letter for completion of [employer] patient assistance program for [employer]. Pt states he applied today and is told he should receive his Medicaid denial letter within 90 days. Pt is to bring letter to office to complete his enrollment for Novolog. Pt verbalized understanding.

## 2020-06-05 NOTE — Congregational Nurse Program (Signed)
[name] remote patient monitoring program follow up in collaboration with [facility]; client is consistently monitoring and you can find client's personal action plan and goals in Epic notes of [name]  Today for comparison will note last 7 day averages as well as last 30 day averages for provider.  Last 7 day average of blood pressures recorded: 136/86 Last 7 day highest blood pressure recorded: 157/91 Last 7 day lowest blood pressure recorded: 114/78   Last 30 day average blood pressures recorded: 132/83 Last 30 day highest blood pressure recorded: 157/91 Last 30 day lowest blood pressure recorded: 11/74   Plan: RN will continue to monitor welch allyn dashboard daily during scheduled office hours per program guidelines to monitor for any escalations or trends needing interventions.  [name] will continue to offer support and health coaching and assist with adjustments or setting or changing personal action plans and goals.  RN and Marnee Spring LPN will meet weekly to discuss and review client's progress and discuss any barriers to meeting goals and develop action plan for solutions to identified barriers.   [name] RN

## 2020-06-09 ENCOUNTER — Telehealth: Payer: Self-pay

## 2020-06-09 NOTE — Telephone Encounter (Signed)
Pt was contacted by phone call on today to get a Weekly update on his successes/barriers/any revisions as it relates to the remote monitoring hypertension program.   Barriers -Limited time to properly exercise and not cook meals at home for past 2 weeks to focus on self, due to caretaking for son who recently moved back home -Stressed due to attending to son's medical needs -BP monitor recently displayed error message to not allow to successfully monitor BP in past few days   Successes  -Continues to conduct BP and weight checks weekly -Continues to take meds on time -Eats smaller portion of food compared to past   Action Plan   -Eat less fast food and cook more at home -Bring BP equipment to clinic on 06/10/20 to get error message corrected -Restart checking BP on 06/11/20 once error message is corrected on machine -Meditate more -Look into a membership at the [facility] for himself and so he can go with his son

## 2020-06-11 ENCOUNTER — Telehealth: Payer: Self-pay

## 2020-06-17 ENCOUNTER — Telehealth: Payer: Self-pay | Admitting: Orthopaedic Surgery

## 2020-06-17 MED ORDER — OXYCODONE-ACETAMINOPHEN 10-325 MG PO TABS: 1.0000 | ORAL_TABLET | ORAL | 0 refills | Status: DC | PRN

## 2020-06-17 NOTE — Telephone Encounter (Addendum)
Pt attended clinic on 06/10/20 to bring BP monitor to receive technical assistance.  States he was having some issues with error message showing up on the monitor.   Plan -Replaced batteries in BP monitor and did a test run prior to pt leaving to ensure equipment was working properly -Also provided pt with information regarding membership cost of the [employer], in addition to a secondary resource of a IT consultant owned gym and its cost.

## 2020-06-17 NOTE — Telephone Encounter (Signed)
Patient called request refill on his pain meds  oxyCODONE-Acetaminophen (Tab) PERCOCET 10-325 MG Take 1 tablet by mouth every 4 (four) hours as needed for pain.    Pharmacy  [facility]

## 2020-06-17 NOTE — Congregational Nurse Program (Signed)
[name] Remote patient monitoring for hypertension in collaboration with [facility]. Client has continued to work with LPN at [employer] for health coaching and support. Client has had recent increase with stress due to caretaking of his son that has recently relocated to live with client. Client is also Diabetic and has been seen and followed by Endocrinology and is awaiting appointment with Registered Dietician.   Last 7 day averages for blood pressure readings along with last 30 day for comparison by provider.  Last 7 day average from recorded blood pressure readings : 135/82 Last 7 day highest recorded blood pressure reading:161/86 Last 7 day lowest recorded blood pressure reading:110/69  Last 30 day average from recorded blood pressure readings: 136/84 Last 30 day highest blood pressure recorded: 164/95 Last 30 day lowest blood pressure recorded: 105/72  Plan: RN will continue to monitor welch allyn dashboard daily during office hours per program guideline to note any escalations or trends requiring intervention and or notification to provider. RN is also following client for DM Last A1C 05/21/20 10.4 *have discussed dietary and portion control using MYPLATE method, client given materials and handouts about MyPlate. Client has been provided strips and meter to monitor blood sugars and instructed to call [facility] as instructed by provider. Client has been given chair exercises with video instruction and written instruction as well as resistance band and hand weights to increase cardio.   LPN will continue to offer support and health coaching around [place], portion control, exercise, decreasing stress for his hypertension.  RN and LPN will continue to discuss client's progress weekly and address any potential barriers that would prevent client from meeting his goals and develop action plan to address any identified barriers.  [name] RN  [name]

## 2020-06-17 NOTE — Telephone Encounter (Signed)
Patient called back to relay that [facility] [place] advised they were out of stock for this medication, and is asking to have it sent to [facility] Possible to do so?

## 2020-06-19 ENCOUNTER — Ambulatory Visit: Payer: Self-pay | Admitting: Physician Assistant

## 2020-06-24 ENCOUNTER — Ambulatory Visit: Payer: Self-pay | Admitting: Orthopaedic Surgery

## 2020-06-24 ENCOUNTER — Other Ambulatory Visit (HOSPITAL_COMMUNITY)
Admission: RE | Admit: 2020-06-24 | Discharge: 2020-06-24 | Disposition: A | Discharge status: Home / Self Care | Payer: Self-pay | Source: Ambulatory Visit | Attending: Physician Assistant | Admitting: Physician Assistant

## 2020-06-24 DIAGNOSIS — E1165 Type 2 diabetes mellitus with hyperglycemia: Secondary | ICD-10-CM | POA: Insufficient documentation

## 2020-06-24 DIAGNOSIS — Z125 Encounter for screening for malignant neoplasm of prostate: Secondary | ICD-10-CM | POA: Insufficient documentation

## 2020-06-24 DIAGNOSIS — I10 Essential (primary) hypertension: Secondary | ICD-10-CM | POA: Insufficient documentation

## 2020-06-24 DIAGNOSIS — E785 Hyperlipidemia, unspecified: Secondary | ICD-10-CM | POA: Insufficient documentation

## 2020-06-24 LAB — COMPREHENSIVE METABOLIC PANEL
ALT: 16 U/L (ref 0–44)
AST: 13 U/L — ABNORMAL LOW (ref 15–41)
Albumin: 3.4 g/dL — ABNORMAL LOW (ref 3.5–5.0)
Alkaline Phosphatase: 83 U/L (ref 38–126)
Anion gap: 9 (ref 5–15)
BUN: 12 mg/dL (ref 6–20)
CO2: 27 mmol/L (ref 22–32)
Calcium: 9 mg/dL (ref 8.9–10.3)
Chloride: 101 mmol/L (ref 98–111)
Creatinine, Ser: 0.77 mg/dL (ref 0.61–1.24)
GFR, Estimated: >60 mL/min (ref >60)
Glucose, Bld: 168 mg/dL — ABNORMAL HIGH (ref 70–99)
Potassium: 4.2 mmol/L (ref 3.5–5.1)
Sodium: 137 mmol/L (ref 135–145)
Total Bilirubin: 0.9 mg/dL (ref 0.3–1.2)
Total Protein: 7.6 g/dL (ref 6.5–8.1)

## 2020-06-24 LAB — LIPID PANEL
Cholesterol: 157 mg/dL (ref 0–200)
HDL: 39 mg/dL — ABNORMAL LOW (ref >40)
LDL Cholesterol: 97 mg/dL (ref 0–99)
Total CHOL/HDL Ratio: 4 RATIO
Triglycerides: 104 mg/dL (ref <150)
VLDL: 21 mg/dL (ref 0–40)

## 2020-06-24 LAB — PSA: Prostatic Specific Antigen: 0.22 ng/mL (ref 0.00–4.00)

## 2020-06-25 ENCOUNTER — Other Ambulatory Visit: Payer: Self-pay | Admitting: Physician Assistant

## 2020-06-25 ENCOUNTER — Encounter: Payer: Self-pay | Admitting: Physician Assistant

## 2020-06-25 ENCOUNTER — Ambulatory Visit: Payer: Self-pay | Admitting: Physician Assistant

## 2020-06-25 ENCOUNTER — Other Ambulatory Visit: Payer: Self-pay

## 2020-06-25 VITALS — BP 129/78 | HR 67 | Temp 97.4°F

## 2020-06-25 DIAGNOSIS — E785 Hyperlipidemia, unspecified: Secondary | ICD-10-CM

## 2020-06-25 DIAGNOSIS — I10 Essential (primary) hypertension: Secondary | ICD-10-CM

## 2020-06-25 DIAGNOSIS — Z5320 Procedure and treatment not carried out because of patient's decision for unspecified reasons: Secondary | ICD-10-CM

## 2020-06-25 DIAGNOSIS — E1165 Type 2 diabetes mellitus with hyperglycemia: Secondary | ICD-10-CM

## 2020-06-25 MED ORDER — AMLODIPINE BESYLATE 5 MG PO TABS: 5.0000 mg | ORAL_TABLET | Freq: Every day | ORAL | 0 refills | Status: DC

## 2020-06-25 MED ORDER — ATORVASTATIN CALCIUM 20 MG PO TABS: 20.0000 mg | ORAL_TABLET | Freq: Every day | ORAL | 0 refills | Status: DC

## 2020-06-25 MED ORDER — LISINOPRIL 20 MG PO TABS: 20.0000 mg | ORAL_TABLET | Freq: Every day | ORAL | 0 refills | Status: DC

## 2020-06-25 MED ORDER — SITAGLIPTIN PHOSPHATE 100 MG PO TABS: 100.0000 mg | ORAL_TABLET | Freq: Every day | ORAL | 0 refills | Status: DC

## 2020-06-25 MED ORDER — METFORMIN HCL 1000 MG PO TABS: 1000.0000 mg | ORAL_TABLET | Freq: Two times a day (BID) | ORAL | 3 refills | Status: DC

## 2020-06-25 NOTE — Progress Notes (Signed)
BP 129/78   Pulse 67   Temp (!) 97.4 F (36.3 C)   SpO2 99%    Subjective:    Patient ID: [name], male    DOB: Apr 15, 1966, 54 y.o.   MRN: [id number]  HPI: [name] is a 54 y.o. male presenting on 06/25/2020 for No chief complaint on file.   HPI   Pt had a negative covid 19 screening questionnaire.   Pt is a 16yoM with uncontrolled DM, HTN, dyslipidemia and super morbid obesity.  He is followed by endocrinology for his DM.    He says his son who has special needs recently moved back in with him because he just got out of the hospital (where he was for 3 years) but there was no availability in appropriate homes for him.  Pt says it is a bit stressful but not too bad currently.  He does admit that it leaves a lot less time for him to care for himself.    Pt is currently using Novolin R for his mealtime insulin because he was unable to get novolog due to he had difficulties completing the paperwork.      Relevant past medical, surgical, family and social history reviewed and updated as indicated. Interim medical history since our last visit reviewed. Allergies and medications reviewed and updated.   Current Outpatient Medications:  .  amLODipine (NORVASC) 5 MG tablet, Take 1 tablet (5 mg total) by mouth daily., Disp: 30 tablet, Rfl: 0 .  atorvastatin (LIPITOR) 20 MG tablet, Take 1 tablet (20 mg total) by mouth daily., Disp: 30 tablet, Rfl: 0 .  insulin glargine (LANTUS) 100 UNIT/ML injection, Inject 0.7 mLs (70 Units total) into the skin at bedtime., Disp: 30 mL, Rfl: 3 .  lisinopril (ZESTRIL) 20 MG tablet, Take 2 tablets (40 mg total) by mouth daily., Disp: 60 tablet, Rfl: 0 .  metFORMIN (GLUCOPHAGE) 1000 MG tablet, Take 1 tablet (1,000 mg total) by mouth 2 (two) times daily with a meal., Disp: 180 tablet, Rfl: 3 .  sitaGLIPtin (JANUVIA) 100 MG tablet, Take 1 tablet (100 mg total) by mouth daily., Disp: 90 tablet, Rfl: 0 .  insulin regular (NOVOLIN R) 100 units/mL  injection, Inject 0.1-0.13 mLs (10-13 Units total) into the skin 3 (three) times daily before meals. With breakfast and with supper only if blood glucose is above 90 md/dl. (Patient not taking: Reported on 06/25/2020), Disp: 30 mL, Rfl: 3 .  metoprolol tartrate (LOPRESSOR) 100 MG tablet, Take 1 tablet (100 mg total) by mouth 2 (two) times daily. (Patient not taking: Reported on 06/25/2020), Disp: 60 tablet, Rfl: 0 .  oxyCODONE-acetaminophen (PERCOCET) 10-325 MG tablet, Take 1 tablet by mouth every 4 (four) hours as needed for pain., Disp: 170 tablet, Rfl: 0      Review of Systems  Per HPI unless specifically indicated above     Objective:    BP 129/78   Pulse 67   Temp (!) 97.4 F (36.3 C)   SpO2 99%   Wt Readings from Last 3 Encounters:  05/21/20 (!) 422 lb (191.4 kg)  03/25/20 (!) 386 lb (175.1 kg)  03/19/20 (!) 417 lb (189.1 kg)    Physical Exam Vitals reviewed.  Constitutional:      General: He is not in acute distress.    Appearance: He is well-developed. He is obese. He is not toxic-appearing.  HENT:     Head: Normocephalic and atraumatic.  Cardiovascular:     Rate and Rhythm:  Normal rate and regular rhythm.  Pulmonary:     Effort: Pulmonary effort is normal.     Breath sounds: Normal breath sounds. No wheezing.  Abdominal:     General: Bowel sounds are normal.     Palpations: Abdomen is soft.     Tenderness: There is no abdominal tenderness.  Musculoskeletal:     Cervical back: Neck supple.     Comments: LE/ shins/ankles with dry flaky skin but improved from previous exams.  No redness or open wounds.  Lymphadenopathy:     Cervical: No cervical adenopathy.  Skin:    General: Skin is warm and dry.  Neurological:     Mental Status: He is alert and oriented to person, place, and time.  Psychiatric:        Attention and Perception: Attention normal.        Speech: Speech normal.        Behavior: Behavior normal. Behavior is cooperative.     Results for  orders placed or performed during the hospital encounter of 06/24/20  PSA  Result Value Ref Range   Prostatic Specific Antigen 0.22 0.00 - 4.00 ng/mL  Lipid panel  Result Value Ref Range   Cholesterol 157 0 - 200 mg/dL   Triglycerides 104 <150 mg/dL   HDL 39 (L) >40 mg/dL   Total CHOL/HDL Ratio 4.0 RATIO   VLDL 21 0 - 40 mg/dL   LDL Cholesterol 97 0 - 99 mg/dL  Comprehensive metabolic panel  Result Value Ref Range   Sodium 137 135 - 145 mmol/L   Potassium 4.2 3.5 - 5.1 mmol/L   Chloride 101 98 - 111 mmol/L   CO2 27 22 - 32 mmol/L   Glucose, Bld 168 (H) 70 - 99 mg/dL   BUN 12 6 - 20 mg/dL   Creatinine, Ser 0.77 0.61 - 1.24 mg/dL   Calcium 9.0 8.9 - 10.3 mg/dL   Total Protein 7.6 6.5 - 8.1 g/dL   Albumin 3.4 (L) 3.5 - 5.0 g/dL   AST 13 (L) 15 - 41 U/L   ALT 16 0 - 44 U/L   Alkaline Phosphatase 83 38 - 126 U/L   Total Bilirubin 0.9 0.3 - 1.2 mg/dL   GFR, Estimated >60 >60 mL/min   Anion gap 9 5 - 15      Assessment & Plan:    Encounter Diagnoses  Name Primary?  . Essential hypertension Yes  . Uncontrolled type 2 diabetes mellitus with hyperglycemia ([place])   . Hyperlipidemia, unspecified hyperlipidemia type   . Morbid obesity ([place])   . Assessment examination declined      -reviewed labs with pt  -will Order apidra (since he had trouble getting the novolog due to the application) -pt is on list for DM eye exam -discussed with pt that he is due to DM foot exam today but he declined, said he didn't want to have to take off his socks and shoes today.  He says he checks them at home daily with a hand-held mirror and he has no sores.  -no medication changes today -pt to continue with endocrinology for his uncontrolled DM -pt to follow up 3 months.  He is to contact office sooner prn

## 2020-06-26 ENCOUNTER — Other Ambulatory Visit: Payer: Self-pay

## 2020-06-26 ENCOUNTER — Ambulatory Visit (INDEPENDENT_AMBULATORY_CARE_PROVIDER_SITE_OTHER): Payer: Self-pay | Admitting: Orthopaedic Surgery

## 2020-06-26 ENCOUNTER — Encounter: Payer: Self-pay | Admitting: Orthopaedic Surgery

## 2020-06-26 VITALS — Ht 68.0 in | Wt >= 6400 oz

## 2020-06-26 DIAGNOSIS — G8929 Other chronic pain: Secondary | ICD-10-CM

## 2020-06-26 DIAGNOSIS — M25561 Pain in right knee: Secondary | ICD-10-CM

## 2020-06-26 DIAGNOSIS — M25562 Pain in left knee: Secondary | ICD-10-CM

## 2020-06-26 DIAGNOSIS — Z6841 Body Mass Index (BMI) 40.0 and over, adult: Secondary | ICD-10-CM

## 2020-06-26 NOTE — Progress Notes (Signed)
My knees hurt  He has pain in both knees.  His weight is the same.  He has no new trauma.  He hurts all the time.  He has no giving way.  He is taking his medicine. Encounter Diagnoses  Name Primary?  . Chronic pain of right knee Yes  . Chronic pain of left knee   . Morbid obesity due to excess calories ([place])   . Body mass index 60.0-69.9, adult ([place])    I will see him in three months.  Call if any problem.  Precautions discussed.   Electronically Signed Sanjuana Kava, MD 10/21/202111:10 AM

## 2020-07-14 ENCOUNTER — Telehealth: Payer: Self-pay

## 2020-07-14 ENCOUNTER — Telehealth: Payer: Self-pay | Admitting: Orthopaedic Surgery

## 2020-07-14 NOTE — Telephone Encounter (Addendum)
Pt was f/u by phone for weekly update on today to address any challenges or successes of BP and Weight checks and Personal Action Plan.     States he has been experiencing more barriers than successes on this week, but plans on getting  back on track on this week.  States no check in medicines.     Success -Continues to take medication as scheduled  Barriers -Stress from caregiving for his son who has mental health challenges -Not having the time to monitor BP on last week due to lifestyle and stress changes -Decreased his monitoring of his BP and weight check due to lack of focus  -Not cooking at home as much but eating out and reverted back to eating some foods that may not be as healthy -No exercise participation   Action Plan -Find ways to focus on taking in self-care more.   -Take the advice of the health coach and reach out to the resources that were provided while on call to help him   better manage his son's medication and his caregiving responsibilities -Attempt to get back on schedule this week

## 2020-07-14 NOTE — Telephone Encounter (Signed)
Patient requests refill on Oxycodone/Actaminophen 10-325 mgs.  Qty  170  Sig: Take 1 tablet by mouth every 4 (four) hours as needed for pain.  [facility] uses [facility]

## 2020-07-15 MED ORDER — OXYCODONE-ACETAMINOPHEN 10-325 MG PO TABS: 1.0000 | ORAL_TABLET | ORAL | 0 refills | Status: DC | PRN

## 2020-08-11 ENCOUNTER — Telehealth: Payer: Self-pay | Admitting: Orthopaedic Surgery

## 2020-08-11 NOTE — Telephone Encounter (Signed)
Patient requests refill on Oxycodone/Actaminophen 10-325 mgs.  Qty  170  Sig: Take 1 tablet by mouth every 4 (four) hours as needed for pain.  [facility] uses [facility]

## 2020-08-12 ENCOUNTER — Encounter: Payer: Self-pay | Admitting: Physician Assistant

## 2020-08-12 ENCOUNTER — Ambulatory Visit: Payer: Self-pay | Admitting: Physician Assistant

## 2020-08-12 VITALS — BP 126/70 | HR 104 | Temp 97.2°F | Ht 68.0 in | Wt >= 6400 oz

## 2020-08-12 DIAGNOSIS — I10 Essential (primary) hypertension: Secondary | ICD-10-CM

## 2020-08-12 DIAGNOSIS — E785 Hyperlipidemia, unspecified: Secondary | ICD-10-CM

## 2020-08-12 DIAGNOSIS — Z6841 Body Mass Index (BMI) 40.0 and over, adult: Secondary | ICD-10-CM

## 2020-08-12 DIAGNOSIS — E1165 Type 2 diabetes mellitus with hyperglycemia: Secondary | ICD-10-CM

## 2020-08-12 DIAGNOSIS — R931 Abnormal findings on diagnostic imaging of heart and coronary circulation: Secondary | ICD-10-CM

## 2020-08-12 DIAGNOSIS — R609 Edema, unspecified: Secondary | ICD-10-CM

## 2020-08-12 DIAGNOSIS — F509 Eating disorder, unspecified: Secondary | ICD-10-CM

## 2020-08-12 DIAGNOSIS — I5189 Other ill-defined heart diseases: Secondary | ICD-10-CM

## 2020-08-12 MED ORDER — FUROSEMIDE 20 MG PO TABS: 20.0000 mg | ORAL_TABLET | Freq: Every day | ORAL | 1 refills | Status: DC

## 2020-08-12 MED ORDER — OXYCODONE-ACETAMINOPHEN 10-325 MG PO TABS: 1.0000 | ORAL_TABLET | ORAL | 0 refills | Status: DC | PRN

## 2020-08-12 NOTE — Progress Notes (Signed)
BP 126/70   Pulse (!) 104   Temp (!) 97.2 F (36.2 C)   Ht 5\' 8"  (1.727 m)   Wt (!) 430 lb 8 oz (195.3 kg)   SpO2 98%   BMI 65.46 kg/m    Subjective:    Patient ID: [name], male    DOB: Sep 21, 1965, 54 y.o.   MRN: [id number]  HPI: EDWEN [name] is a 54 y.o. male presenting on 08/12/2020 for Edema (bilateral feet, ankles, and legs. pt states he has always noticed some swelling and it's worsened in the past 2 weeks and spreaded from feet upwards. pt states it is painful and is retaining more fluid)   HPI     Pt had a negative covid 19 screening questionnaire.     Chief Complaint  Patient presents with  . Edema    bilateral feet, ankles, and legs. pt states he has always noticed some swelling and it's worsened in the past 2 weeks and spreaded from feet upwards. pt states it is painful and is retaining more fluid        Echo 07/01/17-  Normal EF.   Hypertrophy of the septum.  Grade 1 diastolic dysfn  He says he isn't doing too bad with getting up in the middle of the night and eating but says that he is eating foods that he shouldn't be eating.   He hasn't been to dietician in a long time.  He says his cafa/[employer] assistance is active.     Relevant past medical, surgical, family and social history reviewed and updated as indicated. Interim medical history since our last visit reviewed. Allergies and medications reviewed and updated.    Current Outpatient Medications:  .  amLODipine (NORVASC) 5 MG tablet, Take 1 tablet (5 mg total) by mouth daily., Disp: 90 tablet, Rfl: 0 .  atorvastatin (LIPITOR) 20 MG tablet, Take 1 tablet (20 mg total) by mouth daily., Disp: 90 tablet, Rfl: 0 .  insulin glargine (LANTUS) 100 UNIT/ML injection, Inject 0.7 mLs (70 Units total) into the skin at bedtime., Disp: 30 mL, Rfl: 3 .  Insulin Glulisine (APIDRA SOLOSTAR [place]), Inject 12-13 Units into the skin in the morning, at noon, and at bedtime., Disp: , Rfl:  .  lisinopril  (ZESTRIL) 20 MG tablet, Take 1 tablet (20 mg total) by mouth daily., Disp: 90 tablet, Rfl: 0 .  metFORMIN (GLUCOPHAGE) 1000 MG tablet, Take 1 tablet (1,000 mg total) by mouth 2 (two) times daily with a meal., Disp: 180 tablet, Rfl: 3 .  metoprolol tartrate (LOPRESSOR) 100 MG tablet, TAKE 1 Tablet  BY MOUTH TWICE DAILY, Disp: 180 tablet, Rfl: 0 .  oxyCODONE-acetaminophen (PERCOCET) 10-325 MG tablet, Take 1 tablet by mouth every 4 (four) hours as needed for pain., Disp: 170 tablet, Rfl: 0 .  sitaGLIPtin (JANUVIA) 100 MG tablet, Take 1 tablet (100 mg total) by mouth daily., Disp: 90 tablet, Rfl: 0     Review of Systems  Per HPI unless specifically indicated above     Objective:    BP 126/70   Pulse (!) 104   Temp (!) 97.2 F (36.2 C)   Ht 5\' 8"  (1.727 m)   Wt (!) 430 lb 8 oz (195.3 kg)   SpO2 98%   BMI 65.46 kg/m   Wt Readings from Last 3 Encounters:  08/12/20 (!) 430 lb 8 oz (195.3 kg)  06/26/20 (!) 422 lb (191.4 kg)  05/21/20 (!) 422 lb (191.4 kg)  Physical Exam Vitals reviewed.  Constitutional:      General: He is not in acute distress.    Appearance: He is well-developed. He is obese.  HENT:     Head: Normocephalic and atraumatic.  Cardiovascular:     Rate and Rhythm: Normal rate and regular rhythm.  Pulmonary:     Effort: Pulmonary effort is normal.     Breath sounds: Normal breath sounds. No wheezing, rhonchi or rales.  Abdominal:     General: Bowel sounds are normal.     Palpations: Abdomen is soft.     Tenderness: There is no abdominal tenderness.  Musculoskeletal:     Cervical back: Neck supple.     Right lower leg: Edema present.     Left lower leg: Edema present.     Comments: 1+ BLE edema  Lymphadenopathy:     Cervical: No cervical adenopathy.  Skin:    General: Skin is warm and dry.  Neurological:     Mental Status: He is alert and oriented to person, place, and time.  Psychiatric:        Attention and Perception: Attention normal.        Speech:  Speech normal.        Behavior: Behavior normal. Behavior is cooperative.           Assessment & Plan:   Encounter Diagnoses  Name Primary?  . Edema, unspecified type Yes  . Essential hypertension   . Morbid obesity ([place])   . BMI 60.0-69.9, adult ([place])   . Uncontrolled type 2 diabetes mellitus with hyperglycemia ([place])   . Hyperlipidemia, unspecified hyperlipidemia type   . Eating disorder, unspecified type   . Diastolic dysfunction   . Abnormal echocardiogram      -discussed with pt about edema and that it will be difficult to eliminate with his current body habitus and activity level -will Update echo -Add lasix 20mg  -urged pt to Exercise.  Discussed chair exercises and things he can do without a gym membership.  Emphasized that he needs to MOVE. -Pt to call to r/s with dietciain -He says his cafa is active -pt to F/u next month as scheduled

## 2020-08-12 NOTE — Patient Instructions (Signed)
Exercises To Do While Sitting  Exercises that you do while sitting (chair exercises) can give you many of the same benefits as full exercise. Benefits include strengthening your heart, burning calories, and keeping muscles and joints healthy. Exercise can also improve your mood and help with depression and anxiety. You may benefit from chair exercises if you are unable to do standing exercises because of:  Diabetic foot pain.  Obesity.  Illness.  Arthritis.  Recovery from surgery or injury.  Breathing problems.  Balance problems.  Another type of disability. Before starting chair exercises, check with your health care provider or a physical therapist to find out how much exercise you can tolerate and which exercises are safe for you. If your health care provider approves:  Start out slowly and build up over time. Aim to work up to about 10-20 minutes for each exercise session.  Make exercise part of your daily routine.  Drink water when you exercise. Do not wait until you are thirsty. Drink every 10-15 minutes.  Stop exercising right away if you have pain, nausea, shortness of breath, or dizziness.  If you are exercising in a wheelchair, make sure to lock the wheels.  Ask your health care provider whether you can do tai chi or yoga. Many positions in these mind-body exercises can be modified to do while seated. Warm-up Before starting other exercises: 1. Sit up as straight as you can. Have your knees bent at 90 degrees, which is the shape of the capital letter "L." Keep your feet flat on the floor. 2. Sit at the front edge of your chair, if you can. 3. Pull in (tighten) the muscles in your abdomen and stretch your spine and neck as straight as you can. Hold this position for a few minutes. 4. Breathe in and out evenly. Try to concentrate on your breathing, and relax your mind. Stretching Exercise A: Arm stretch 1. Hold your arms out straight in front of your body. 2. Bend  your hands at the wrist with your fingers pointing up, as if signaling someone to stop. Notice the slight tension in your forearms as you hold the position. 3. Keeping your arms out and your hands bent, rotate your hands outward as far as you can and hold this stretch. Aim to have your thumbs pointing up and your pinkie fingers pointing down. Slowly repeat arm stretches for one minute as tolerated. Exercise B: Leg stretch 1. If you can move your legs, try to "draw" letters on the floor with the toes of your foot. Write your name with one foot. 2. Write your name with the toes of your other foot. Slowly repeat the movements for one minute as tolerated. Exercise C: Reach for the sky 1. Reach your hands as far over your head as you can to stretch your spine. 2. Move your hands and arms as if you are climbing a rope. Slowly repeat the movements for one minute as tolerated. Range of motion exercises Exercise A: Shoulder roll 1. Let your arms hang loosely at your sides. 2. Lift just your shoulders up toward your ears, then let them relax back down. 3. When your shoulders feel loose, rotate your shoulders in backward and forward circles. Do shoulder rolls slowly for one minute as tolerated. Exercise B: March in place 1. As if you are marching, pump your arms and lift your legs up and down. Lift your knees as high as you can. ? If you are unable to lift your knees,  just pump your arms and move your ankles and feet up and down. March in place for one minute as tolerated. Exercise C: Seated jumping jacks 1. Let your arms hang down straight. 2. Keeping your arms straight, lift them up over your head. Aim to point your fingers to the ceiling. 3. While you lift your arms, straighten your legs and slide your heels along the floor to your sides, as wide as you can. 4. As you bring your arms back down to your sides, slide your legs back together. ? If you are unable to use your legs, just move your  arms. Slowly repeat seated jumping jacks for one minute as tolerated. Strengthening exercises Exercise A: Shoulder squeeze 1. Hold your arms straight out from your body to your sides, with your elbows bent and your fists pointed at the ceiling. 2. Keeping your arms in the bent position, move them forward so your elbows and forearms meet in front of your face. 3. Open your arms back out as wide as you can with your elbows still bent, until you feel your shoulder blades squeezing together. Hold for 5 seconds. Slowly repeat the movements forward and backward for one minute as tolerated. Contact a health care provider if you:  Had to stop exercising due to any of the following: ? Pain. ? Nausea. ? Shortness of breath. ? Dizziness. ? Fatigue.  Have significant pain or soreness after exercising. Get help right away if you have:  Chest pain.  Difficulty breathing. These symptoms may represent a serious problem that is an emergency. Do not wait to see if the symptoms will go away. Get medical help right away. Call your local emergency services (911 in the [place] Do not drive yourself to the hospital. This information is not intended to replace advice given to you by your health care provider. Make sure you discuss any questions you have with your health care provider. Document Revised: 12/14/2018 Document Reviewed: 07/06/2017 Elsevier Patient Education  2020 [employer].

## 2020-08-13 ENCOUNTER — Telehealth: Payer: Self-pay | Admitting: Orthopaedic Surgery

## 2020-08-13 NOTE — Telephone Encounter (Signed)
Patient states the pharmacy said they didn't have enough and that Dr. Luna Glasgow needs to send in a request for 24 more pills ... They only gave him 146 pills.  [facility] [place]

## 2020-08-14 NOTE — Telephone Encounter (Signed)
Call in a week early next time, I cannot do now.

## 2020-08-19 NOTE — Telephone Encounter (Signed)
No.  See my prior note

## 2020-08-19 NOTE — Telephone Encounter (Signed)
[name] called back stating the same message as last week.    He only had 146 Oxycodone/Acetaminophen 10-325 mgs. pills filled.  He asks for refill of 24 pills  [facility] uses [facility]

## 2020-08-26 NOTE — Congregational Nurse Program (Signed)
Client by to [name] to inquire about strips. Client is DM and is currently testing 4 x daily. He does follow up with Dr. Dorris Fetch for endocrinology and has been referred to Dietician for outpatient education.  Client given 100 strips. Will follow up with client regarding some possible upcoming opportunities.  will continue to follow.  [name] RN [name]

## 2020-09-09 ENCOUNTER — Telehealth: Payer: Self-pay | Admitting: Orthopaedic Surgery

## 2020-09-09 MED ORDER — OXYCODONE-ACETAMINOPHEN 10-325 MG PO TABS: 1.0000 | ORAL_TABLET | ORAL | 0 refills | Status: DC | PRN

## 2020-09-09 NOTE — Telephone Encounter (Signed)
Patient requests refill on Oxycodone/Acetaminophen 10-325 mgs.  Qty  170  Sig: Take 1 tablet by mouth every 4 (four) hours as needed for pain.  [facility]

## 2020-09-11 ENCOUNTER — Ambulatory Visit (HOSPITAL_COMMUNITY): Admission: RE | Admit: 2020-09-11 | Payer: Self-pay | Source: Ambulatory Visit

## 2020-09-19 ENCOUNTER — Other Ambulatory Visit: Payer: Self-pay

## 2020-09-19 DIAGNOSIS — Z20822 Contact with and (suspected) exposure to covid-19: Secondary | ICD-10-CM

## 2020-09-22 ENCOUNTER — Other Ambulatory Visit: Payer: Self-pay

## 2020-09-22 ENCOUNTER — Telehealth: Payer: Self-pay | Admitting: Nurse Practitioner

## 2020-09-22 LAB — NOVEL CORONAVIRUS, NAA: SARS-CoV-2, NAA: NOT DETECTED

## 2020-09-25 ENCOUNTER — Ambulatory Visit: Payer: Self-pay | Admitting: Physician Assistant

## 2020-09-25 ENCOUNTER — Encounter: Payer: Self-pay | Admitting: Physician Assistant

## 2020-09-25 DIAGNOSIS — E785 Hyperlipidemia, unspecified: Secondary | ICD-10-CM

## 2020-09-25 DIAGNOSIS — F509 Eating disorder, unspecified: Secondary | ICD-10-CM

## 2020-09-25 DIAGNOSIS — E1165 Type 2 diabetes mellitus with hyperglycemia: Secondary | ICD-10-CM

## 2020-09-25 DIAGNOSIS — I10 Essential (primary) hypertension: Secondary | ICD-10-CM

## 2020-09-25 NOTE — Progress Notes (Signed)
There were no vitals taken for this visit.   Subjective:    Patient ID: [name], male    DOB: 1966-05-04, 55 y.o.   MRN: [id number]  HPI: [name] is a 55 y.o. male presenting on 09/25/2020 for No chief complaint on file.   HPI  This is a telemedicine appointment due to coronavirus pandemic.  It is via Telephone because pt is at the grocery store instead of being at home at his appointment time.  I connected with  [name] on 09/25/20 by a video enabled telemedicine application and verified that I am speaking with the correct person using two identifiers.   I discussed the limitations of evaluation and management by telemedicine. The patient expressed understanding and agreed to proceed.  Pt ia at grocery store.  Provider at office.  Pt says he is fine with his level of privacy where he is now.  He says his son is the only person within hearing range of him.       Pt is 74yoM with uncontrolled DM, htn, morbid obesity.  He says he is doing fine lately.    He has an appointment for his covid booster Monday which is the fist day he is eligible.  He is monitoring his bp at home and says it is good.  He checked it today and it was120/86 today  He is Doing well  He says he is trying to do a little bit of exercising.  He says his Eating habits are not doing too great.  He had appointment with RD on monday but it was rescheduled due to snow and ice.  He is seeing Endocrinology for DM  He Sees orthopedics for his knees.  He says he has no new issues.     Relevant past medical, surgical, family and social history reviewed and updated as indicated. Interim medical history since our last visit reviewed. Allergies and medications reviewed and updated.   Current Outpatient Medications:  .  amLODipine (NORVASC) 5 MG tablet, Take 1 tablet (5 mg total) by mouth daily., Disp: 90 tablet, Rfl: 0 .  atorvastatin (LIPITOR) 20 MG tablet, Take 1 tablet (20 mg total) by mouth  daily., Disp: 90 tablet, Rfl: 0 .  furosemide (LASIX) 20 MG tablet, Take 1 tablet (20 mg total) by mouth daily., Disp: 30 tablet, Rfl: 1 .  insulin glargine (LANTUS) 100 UNIT/ML injection, Inject 0.7 mLs (70 Units total) into the skin at bedtime., Disp: 30 mL, Rfl: 3 .  Insulin Glulisine (APIDRA SOLOSTAR [place]), Inject 12-13 Units into the skin in the morning, at noon, and at bedtime., Disp: , Rfl:  .  lisinopril (ZESTRIL) 20 MG tablet, Take 1 tablet (20 mg total) by mouth daily., Disp: 90 tablet, Rfl: 0 .  metFORMIN (GLUCOPHAGE) 1000 MG tablet, Take 1 tablet (1,000 mg total) by mouth 2 (two) times daily with a meal., Disp: 180 tablet, Rfl: 3 .  metoprolol tartrate (LOPRESSOR) 100 MG tablet, TAKE 1 Tablet  BY MOUTH TWICE DAILY, Disp: 180 tablet, Rfl: 0 .  oxyCODONE-acetaminophen (PERCOCET) 10-325 MG tablet, Take 1 tablet by mouth every 4 (four) hours as needed for pain., Disp: 170 tablet, Rfl: 0 .  sitaGLIPtin (JANUVIA) 100 MG tablet, Take 1 tablet (100 mg total) by mouth daily., Disp: 90 tablet, Rfl: 0    Review of Systems  Per HPI unless specifically indicated above     Objective:    There were no vitals taken for this visit.  Wt Readings from Last 3 Encounters:  08/12/20 (!) 430 lb 8 oz (195.3 kg)  06/26/20 (!) 422 lb (191.4 kg)  05/21/20 (!) 422 lb (191.4 kg)    Physical Exam Pulmonary:     Effort: No respiratory distress.     Comments: Pt is speaking in complete sentences without having to stop. Neurological:     Mental Status: He is alert and oriented to person, place, and time.  Psychiatric:        Attention and Perception: Attention normal.        Speech: Speech normal.        Behavior: Behavior is cooperative.            Assessment & Plan:    Encounter Diagnoses  Name Primary?  . Essential hypertension Yes  . Uncontrolled type 2 diabetes mellitus with hyperglycemia ([place])   . Hyperlipidemia, unspecified hyperlipidemia type   . Morbid obesity ([place])   . Eating  disorder, unspecified type      -Pt to get fasting labs.  He will be Called with results -Encouraged exercise.  Discussed that he can do chair exercises if his knee can't tolerate standing -pt to Contnue with endocrinology per their recomendations -Pt to Get back with RD -pt to follow up 3 months.  He is to contact office sooner prn

## 2020-09-30 ENCOUNTER — Ambulatory Visit (INDEPENDENT_AMBULATORY_CARE_PROVIDER_SITE_OTHER): Payer: Self-pay | Admitting: Orthopaedic Surgery

## 2020-09-30 ENCOUNTER — Other Ambulatory Visit: Payer: Self-pay

## 2020-09-30 ENCOUNTER — Encounter: Payer: Self-pay | Admitting: Orthopaedic Surgery

## 2020-09-30 VITALS — BP 161/101 | HR 87 | Ht 68.0 in | Wt >= 6400 oz

## 2020-09-30 DIAGNOSIS — M25561 Pain in right knee: Secondary | ICD-10-CM

## 2020-09-30 DIAGNOSIS — Z6841 Body Mass Index (BMI) 40.0 and over, adult: Secondary | ICD-10-CM

## 2020-09-30 DIAGNOSIS — G8929 Other chronic pain: Secondary | ICD-10-CM

## 2020-09-30 DIAGNOSIS — M25562 Pain in left knee: Secondary | ICD-10-CM

## 2020-09-30 NOTE — Progress Notes (Signed)
Patient [name], male DOB:10/30/1965, 55 y.o. DGL:[id number]  Chief Complaint  Patient presents with  . Knee Pain    Bilateral knee pain,     HPI  [name] is a 55 y.o. male who has chronic pain of both knees.  Pain is worse with the recent snow and the very cold weather.  He has no new trauma, no giving way. He is taking his medicine.   Body mass index is 65.38 kg/m.  The patient meets the AMA guidelines for Morbid (severe) obesity with a BMI > 40.0 and I have recommended weight loss.   ROS  Review of Systems  Constitutional: Positive for activity change.  HENT: Negative for congestion.   Respiratory: Positive for shortness of breath. Negative for cough.   Cardiovascular: Positive for leg swelling. Negative for chest pain.  Endocrine: Negative for cold intolerance.  Musculoskeletal: Positive for arthralgias, back pain, gait problem, joint swelling and myalgias.  Allergic/Immunologic: Positive for environmental allergies.  Neurological: Negative for numbness.  All other systems reviewed and are negative.   All other systems reviewed and are negative.  The following is a summary of the past history medically, past history surgically, known current medicines, social history and family history.  This information is gathered electronically by the computer from prior information and documentation.  I review this each visit and have found including this information at this point in the chart is beneficial and informative.    Past Medical History:  Diagnosis Date  . Arthritis   . Diabetes mellitus without complication ([place])    diagnosed about age 21  . Gout   . Hypertension    boarderline    Past Surgical History:  Procedure Laterality Date  . HERNIA REPAIR  2008  . MASS EXCISION Left age 65   L thigh- benign    Family History  Problem Relation Age of Onset  . Cancer Mother   . Diabetes Mother   . Heart failure Father   . Hypertension Father   . Diabetes  Father   . Heart attack Father     Social History Social History   Tobacco Use  . Smoking status: Former Smoker    Packs/day: 0.50    Years: 5.00    Pack years: 2.50    Types: Cigarettes    Quit date: 1990    Years since quitting: 32.0  . Smokeless tobacco: Never Used  Vaping Use  . Vaping Use: Never used  Substance Use Topics  . Alcohol use: No  . Drug use: No    No Known Allergies  Current Outpatient Medications  Medication Sig Dispense Refill  . amLODipine (NORVASC) 5 MG tablet Take 1 tablet (5 mg total) by mouth daily. 90 tablet 0  . atorvastatin (LIPITOR) 20 MG tablet Take 1 tablet (20 mg total) by mouth daily. 90 tablet 0  . furosemide (LASIX) 20 MG tablet Take 1 tablet (20 mg total) by mouth daily. 30 tablet 1  . insulin glargine (LANTUS) 100 UNIT/ML injection Inject 0.7 mLs (70 Units total) into the skin at bedtime. 30 mL 3  . Insulin Glulisine (APIDRA SOLOSTAR [place]) Inject 12-13 Units into the skin in the morning, at noon, and at bedtime.    [name] lisinopril (ZESTRIL) 20 MG tablet Take 1 tablet (20 mg total) by mouth daily. 90 tablet 0  . metFORMIN (GLUCOPHAGE) 1000 MG tablet Take 1 tablet (1,000 mg total) by mouth 2 (two) times daily with a meal. 180 tablet 3  .  metoprolol tartrate (LOPRESSOR) 100 MG tablet TAKE 1 Tablet  BY MOUTH TWICE DAILY 180 tablet 0  . oxyCODONE-acetaminophen (PERCOCET) 10-325 MG tablet Take 1 tablet by mouth every 4 (four) hours as needed for pain. 170 tablet 0  . sitaGLIPtin (JANUVIA) 100 MG tablet Take 1 tablet (100 mg total) by mouth daily. 90 tablet 0   No current facility-administered medications for this visit.     Physical Exam  Blood pressure (!) 161/101, pulse 87, height 5\' 8"  (1.727 m), weight (!) 430 lb (195 kg).  Constitutional: overall normal hygiene, normal nutrition, well developed, normal grooming, normal body habitus. Assistive device:none  Musculoskeletal: gait and station Limp left, muscle tone and strength are normal, no  tremors or atrophy is present.  .  Neurological: coordination overall normal.  Deep tendon reflex/nerve stretch intact.  Sensation normal.  Cranial nerves II-XII intact.   Skin:   Normal overall no scars, lesions, ulcers or rashes. No psoriasis.  Psychiatric: Alert and oriented x 3.  Recent memory intact, remote memory unclear.  Normal mood and affect. Well groomed.  Good eye contact.  Cardiovascular: overall no swelling, no varicosities, no edema bilaterally, normal temperatures of the legs and arms, no clubbing, cyanosis and good capillary refill.  Lymphatic: palpation is normal.  Both knees are tender, have effusion, crepitus, ROM right 0 to 105, left 0 to 100.  Limp left.  NV intact.  All other systems reviewed and are negative   The patient has been educated about the nature of the problem(s) and counseled on treatment options.  The patient appeared to understand what I have discussed and is in agreement with it.  Encounter Diagnoses  Name Primary?  . Chronic pain of right knee Yes  . Chronic pain of left knee   . Morbid obesity due to excess calories ([place])   . Body mass index 60.0-69.9, adult [facility])     PLAN Call if any problems.  Precautions discussed.  Continue current medications.   Return to clinic 3 months   Electronically Signed Sanjuana Kava, MD 1/25/20222:14 PM

## 2020-10-06 ENCOUNTER — Telehealth: Payer: Self-pay

## 2020-10-06 NOTE — Telephone Encounter (Signed)
Patient called for a new prescription of pain medication  Oxycodone-Acetaminophen 10/325mg   Qty 170 Tablets  PATIENT USES [facility]

## 2020-10-07 MED ORDER — OXYCODONE-ACETAMINOPHEN 10-325 MG PO TABS: 1.0000 | ORAL_TABLET | ORAL | 0 refills | Status: DC | PRN

## 2020-10-13 ENCOUNTER — Encounter: Payer: Self-pay | Admitting: Physician Assistant

## 2020-10-28 ENCOUNTER — Telehealth: Payer: Self-pay | Admitting: Orthopaedic Surgery

## 2020-10-28 NOTE — Telephone Encounter (Signed)
Patient called for a refill of his Hydrocodone.   [facility] [place].

## 2020-10-30 MED ORDER — OXYCODONE-ACETAMINOPHEN 10-325 MG PO TABS: 1.0000 | ORAL_TABLET | ORAL | 0 refills | Status: DC | PRN

## 2020-11-26 ENCOUNTER — Telehealth: Payer: Self-pay | Admitting: Orthopaedic Surgery

## 2020-11-26 NOTE — Telephone Encounter (Signed)
Patient requests Oxycodone 10-325 mgs.  Qty  170  Sig: Take 1 tablet by mouth every 4 (four) hours as needed for pain.  Patient states he uses [facility]

## 2020-11-27 MED ORDER — OXYCODONE-ACETAMINOPHEN 10-325 MG PO TABS: 1.0000 | ORAL_TABLET | ORAL | 0 refills | Status: DC | PRN

## 2020-12-03 ENCOUNTER — Telehealth: Payer: Self-pay

## 2020-12-03 NOTE — Telephone Encounter (Signed)
Returned client's call. Client is in need of some glucometer strips in order to check his blood sugars accordingly. Will plan client will come by [facility] 12/04/20 afternoon. Will follow up at that time and address any further needs.  [name] RN [name]

## 2020-12-05 NOTE — Congregational Nurse Program (Signed)
Entry for 12/04/20 at 1400  Client into [facility] to pick up strips for glucose monitoring. 2 bottles of trumetrix strips provided. Client is to test 4 x daily. Client admits his "numbers are not great" He admits he needs improvement with Nutrition and exercise. Reminded client to call and schedule appointment with endocrinology. He does report he has insulins and medications at this time.  Will continue to follow as client does not have time today for a full interview/review. Client agreeable.  [name] RN [name]

## 2020-12-05 NOTE — Congregational Nurse Program (Signed)
  Dept: [phone]   Congregational Nurse Program Note  Date of Encounter: 12/05/2020  Past Medical History: Past Medical History:  Diagnosis Date  . Arthritis   . Diabetes mellitus without complication ([place])    diagnosed about age 55  . Gout   . Hypertension    boarderline    Encounter Details:

## 2020-12-24 ENCOUNTER — Telehealth: Payer: Self-pay

## 2020-12-24 MED ORDER — OXYCODONE-ACETAMINOPHEN 10-325 MG PO TABS: 1.0000 | ORAL_TABLET | ORAL | 0 refills | Status: DC | PRN

## 2020-12-24 NOTE — Telephone Encounter (Signed)
Oxycodone-Acetaminophen 10/325 mg  Qty 170 Tablets  PATIENT USES [facility]

## 2020-12-29 ENCOUNTER — Ambulatory Visit: Payer: Self-pay | Admitting: Physician Assistant

## 2020-12-29 ENCOUNTER — Telehealth: Payer: Self-pay

## 2020-12-29 NOTE — Telephone Encounter (Signed)
Called client to follow up. Plan was to meet client today at [facility], however client rescheduled.  Attempted call to follow up no answer, left voicemail requesting return call  [place] [facility]

## 2020-12-30 ENCOUNTER — Ambulatory Visit: Payer: Self-pay | Admitting: Orthopaedic Surgery

## 2020-12-31 ENCOUNTER — Telehealth: Payer: Self-pay

## 2020-12-31 NOTE — Telephone Encounter (Signed)
Returned call to client. No answer, left voicemail requesting return call.   [name] RN [name]

## 2021-01-02 ENCOUNTER — Other Ambulatory Visit (HOSPITAL_COMMUNITY)
Admission: RE | Admit: 2021-01-02 | Discharge: 2021-01-02 | Disposition: A | Discharge status: Home / Self Care | Payer: Self-pay | Source: Ambulatory Visit | Attending: Physician Assistant | Admitting: Physician Assistant

## 2021-01-02 ENCOUNTER — Other Ambulatory Visit: Payer: Self-pay

## 2021-01-02 DIAGNOSIS — I10 Essential (primary) hypertension: Secondary | ICD-10-CM | POA: Insufficient documentation

## 2021-01-02 DIAGNOSIS — E1165 Type 2 diabetes mellitus with hyperglycemia: Secondary | ICD-10-CM | POA: Insufficient documentation

## 2021-01-02 DIAGNOSIS — E785 Hyperlipidemia, unspecified: Secondary | ICD-10-CM | POA: Insufficient documentation

## 2021-01-02 LAB — COMPREHENSIVE METABOLIC PANEL
ALT: 38 U/L (ref 0–44)
AST: 31 U/L (ref 15–41)
Albumin: 3.2 g/dL — ABNORMAL LOW (ref 3.5–5.0)
Alkaline Phosphatase: 166 U/L — ABNORMAL HIGH (ref 38–126)
Anion gap: 10 (ref 5–15)
BUN: 17 mg/dL (ref 6–20)
CO2: 26 mmol/L (ref 22–32)
Calcium: 9 mg/dL (ref 8.9–10.3)
Chloride: 97 mmol/L — ABNORMAL LOW (ref 98–111)
Creatinine, Ser: 0.79 mg/dL (ref 0.61–1.24)
GFR, Estimated: >60 mL/min (ref >60)
Glucose, Bld: 293 mg/dL — ABNORMAL HIGH (ref 70–99)
Potassium: 5.3 mmol/L — ABNORMAL HIGH (ref 3.5–5.1)
Sodium: 133 mmol/L — ABNORMAL LOW (ref 135–145)
Total Bilirubin: 0.8 mg/dL (ref 0.3–1.2)
Total Protein: 7.6 g/dL (ref 6.5–8.1)

## 2021-01-02 LAB — LIPID PANEL
Cholesterol: 187 mg/dL (ref 0–200)
HDL: 39 mg/dL — ABNORMAL LOW (ref >40)
LDL Cholesterol: 124 mg/dL — ABNORMAL HIGH (ref 0–99)
Total CHOL/HDL Ratio: 4.8 RATIO
Triglycerides: 119 mg/dL (ref <150)
VLDL: 24 mg/dL (ref 0–40)

## 2021-01-05 ENCOUNTER — Other Ambulatory Visit: Payer: Self-pay

## 2021-01-05 ENCOUNTER — Ambulatory Visit: Payer: Self-pay | Admitting: Physician Assistant

## 2021-01-05 ENCOUNTER — Telehealth: Payer: Self-pay

## 2021-01-05 VITALS — BP 128/79 | Temp 97.3°F | Wt >= 6400 oz

## 2021-01-05 DIAGNOSIS — L84 Corns and callosities: Secondary | ICD-10-CM

## 2021-01-05 DIAGNOSIS — Z1211 Encounter for screening for malignant neoplasm of colon: Secondary | ICD-10-CM

## 2021-01-05 DIAGNOSIS — L602 Onychogryphosis: Secondary | ICD-10-CM

## 2021-01-05 DIAGNOSIS — Z91199 Patient's noncompliance with other medical treatment and regimen due to unspecified reason: Secondary | ICD-10-CM

## 2021-01-05 DIAGNOSIS — I5189 Other ill-defined heart diseases: Secondary | ICD-10-CM

## 2021-01-05 DIAGNOSIS — I10 Essential (primary) hypertension: Secondary | ICD-10-CM

## 2021-01-05 DIAGNOSIS — Z9119 Patient's noncompliance with other medical treatment and regimen: Secondary | ICD-10-CM

## 2021-01-05 DIAGNOSIS — E1165 Type 2 diabetes mellitus with hyperglycemia: Secondary | ICD-10-CM

## 2021-01-05 DIAGNOSIS — E785 Hyperlipidemia, unspecified: Secondary | ICD-10-CM

## 2021-01-05 NOTE — Patient Instructions (Signed)
Endocrinology Phone: [phone]

## 2021-01-05 NOTE — Telephone Encounter (Signed)
Called client for Follow up today. Discussed last with client about rescheduling with Dr. Dorris Fetch for endocrinology. Client has not scheduled to date. Last seen in September. Client reports blood sugars remaining elevated in 300s. Client states he has his medications and is taking them, but reports he has not been watching his diet. Client is interested in the [facility] assistance that we are working on getting funding so that client can exercise in the pool low impact.  Last conversation discussed with Client getting [facility] financial application completed and getting [facility] the documents needed to process that.  Called client today reviewed all documents he would need to gather and will check on his progress often. Stressed to client that his health is at stake not following up with Dr. Dorris Fetch as he should. [employer] would assist in copayments. Client reports he will get the things he needs together.  Will continue to follow.  [name]/[facility].

## 2021-01-05 NOTE — Progress Notes (Signed)
BP 128/79   Temp (!) 97.3 F (36.3 C)   Wt (!) 402 lb (182.3 kg)   SpO2 97%   BMI 61.12 kg/m    Subjective:    Patient ID: [name], male    DOB: [birth year], 55 y.o.   MRN: [id number]  HPI: [name] is a 55 y.o. male presenting on 01/05/2021 for No chief complaint on file.   HPI    Pt had a negative covid 19 screening questionnaire.    Pt has appointment today for routine follow up.  He was a no-show to his appointment last week.  Pt is [name] with many medical problems including uncontrolled DM, HTN, and dyslipidemia which are complicated by chronic noncompliance.  Pt diabetes is managed by endocrinology but he has not been there for appointment since September.  He is followed by orthopedics for chronic knee pain.  Pt has stress in his life as he is trying to help his special needs son.    Pt reports Feeling bad for 3 days.  He says he is Feeling tired and exhausted.  He has No body aches, cough, fever, EA, ST.  He says he doesn't think it is due to depression.   He did not bring his meds; he is unsure what he is taking.  He hasn't been to endocrinologist since September.    Relevant past medical, surgical, family and social history reviewed and updated as indicated. Interim medical history since our last visit reviewed. Allergies and medications reviewed and updated.    Current Outpatient Medications:  .  insulin glargine (LANTUS) 100 UNIT/ML injection, Inject 0.7 mLs (70 Units total) into the skin at bedtime., Disp: 30 mL, Rfl: 3 .  Insulin Glulisine (APIDRA SOLOSTAR [place]), Inject 12-13 Units into the skin in the morning, at noon, and at bedtime., Disp: , Rfl:  .  sitaGLIPtin (JANUVIA) 100 MG tablet, Take 1 tablet (100 mg total) by mouth daily., Disp: 90 tablet, Rfl: 0 .  amLODipine (NORVASC) 5 MG tablet, Take 1 tablet (5 mg total) by mouth daily., Disp: 90 tablet, Rfl: 0 .  atorvastatin (LIPITOR) 20 MG tablet, Take 1 tablet (20 mg total) by mouth daily., Disp: 90  tablet, Rfl: 0 .  furosemide (LASIX) 20 MG tablet, Take 1 tablet (20 mg total) by mouth daily., Disp: 30 tablet, Rfl: 1 .  lisinopril (ZESTRIL) 20 MG tablet, Take 1 tablet (20 mg total) by mouth daily., Disp: 90 tablet, Rfl: 0 .  metFORMIN (GLUCOPHAGE) 1000 MG tablet, Take 1 tablet (1,000 mg total) by mouth 2 (two) times daily with a meal., Disp: 180 tablet, Rfl: 3 .  metoprolol tartrate (LOPRESSOR) 100 MG tablet, TAKE 1 Tablet  BY MOUTH TWICE DAILY, Disp: 180 tablet, Rfl: 0 .  oxyCODONE-acetaminophen (PERCOCET) 10-325 MG tablet, Take 1 tablet by mouth every 4 (four) hours as needed for pain., Disp: 170 tablet, Rfl: 0    Review of Systems  Per HPI unless specifically indicated above     Objective:    BP 128/79   Temp (!) 97.3 F (36.3 C)   Wt (!) 402 lb (182.3 kg)   SpO2 97%   BMI 61.12 kg/m   Wt Readings from Last 3 Encounters:  01/05/21 (!) 402 lb (182.3 kg)  09/30/20 (!) 430 lb (195 kg)  08/12/20 (!) 430 lb 8 oz (195.3 kg)    Physical Exam Constitutional:      General: He is not in acute distress.    Appearance: He  is obese. He is not toxic-appearing.  HENT:     Head: Normocephalic and atraumatic.  Cardiovascular:     Rate and Rhythm: Normal rate and regular rhythm.  Pulmonary:     Effort: Pulmonary effort is normal. No respiratory distress.     Breath sounds: Normal breath sounds.  Abdominal:     General: Bowel sounds are normal.     Palpations: Abdomen is soft.     Tenderness: There is no abdominal tenderness.  Musculoskeletal:     Cervical back: Neck supple.  Feet:     Comments: Foot exam updated.  Feet with thickened skin, very thick callus formation, long thick nails.  No skin breakdown.  Pt counseled on foot care but his ability to care for his feet limited due to body habitus. Lymphadenopathy:     Cervical: No cervical adenopathy.  Skin:    General: Skin is warm and dry.  Neurological:     Mental Status: He is alert and oriented to person, place, and  time.  Psychiatric:        Attention and Perception: Attention normal.        Speech: Speech normal.        Behavior: Behavior normal. Behavior is cooperative.       Results for orders placed or performed during the hospital encounter of 01/02/21  Lipid panel  Result Value Ref Range   Cholesterol 187 0 - 200 mg/dL   Triglycerides 119 <150 mg/dL   HDL 39 (L) >40 mg/dL   Total CHOL/HDL Ratio 4.8 RATIO   VLDL 24 0 - 40 mg/dL   LDL Cholesterol 124 (H) 0 - 99 mg/dL  Comprehensive metabolic panel  Result Value Ref Range   Sodium 133 (L) 135 - 145 mmol/L   Potassium 5.3 (H) 3.5 - 5.1 mmol/L   Chloride 97 (L) 98 - 111 mmol/L   CO2 26 22 - 32 mmol/L   Glucose, Bld 293 (H) 70 - 99 mg/dL   BUN 17 6 - 20 mg/dL   Creatinine, Ser 0.79 0.61 - 1.24 mg/dL   Calcium 9.0 8.9 - 10.3 mg/dL   Total Protein 7.6 6.5 - 8.1 g/dL   Albumin 3.2 (L) 3.5 - 5.0 g/dL   AST 31 15 - 41 U/L   ALT 38 0 - 44 U/L   Alkaline Phosphatase 166 (H) 38 - 126 U/L   Total Bilirubin 0.8 0.3 - 1.2 mg/dL   GFR, Estimated >60 >60 mL/min   Anion gap 10 5 - 15         Assessment & Plan:    Encounter Diagnoses  Name Primary?  . Essential hypertension Yes  . Uncontrolled type 2 diabetes mellitus with hyperglycemia ([place])   . Hyperlipidemia, unspecified hyperlipidemia type   . Morbid obesity ([place])   . Diastolic dysfunction   . Personal history of noncompliance with medical treatment, presenting hazards to health   . Screening for colon cancer   . Callus of foot   . Thickened nails      -Reviewed labs with pt  Encouraged covid test in light of him feeling so bad.  He is asked to notify office of his result -Pt to call to reschedule with endocrinology.  Discussed with pt that they prescribe his diabetic medications so their input is essential.  He states understanding -pt is given FIT test for colon cancer screening  -Feet with thickened skin, very thick callus formation, long thick nails.  No skin breakdown.   Pt  counseled on foot care but his ability to care for his feet limited due to body habitus.  Pt encouraged to get hand mirror so he can look at his feet every day. -Refer to podiatrist -Pt working with [facility] on [facility] / [facility] financial assistance -Pt to follow up 3 months.

## 2021-01-06 ENCOUNTER — Other Ambulatory Visit: Payer: Self-pay | Admitting: Physician Assistant

## 2021-01-06 ENCOUNTER — Encounter: Payer: Self-pay | Admitting: Physician Assistant

## 2021-01-06 DIAGNOSIS — Z1211 Encounter for screening for malignant neoplasm of colon: Secondary | ICD-10-CM

## 2021-01-08 ENCOUNTER — Encounter: Payer: Self-pay | Admitting: Orthopaedic Surgery

## 2021-01-08 ENCOUNTER — Ambulatory Visit (INDEPENDENT_AMBULATORY_CARE_PROVIDER_SITE_OTHER): Payer: Self-pay | Admitting: Orthopaedic Surgery

## 2021-01-08 ENCOUNTER — Telehealth: Payer: Self-pay

## 2021-01-08 ENCOUNTER — Other Ambulatory Visit: Payer: Self-pay

## 2021-01-08 VITALS — BP 124/67 | HR 91 | Ht 68.0 in | Wt >= 6400 oz

## 2021-01-08 DIAGNOSIS — Z6841 Body Mass Index (BMI) 40.0 and over, adult: Secondary | ICD-10-CM

## 2021-01-08 DIAGNOSIS — M25562 Pain in left knee: Secondary | ICD-10-CM

## 2021-01-08 DIAGNOSIS — G8929 Other chronic pain: Secondary | ICD-10-CM

## 2021-01-08 DIAGNOSIS — M25561 Pain in right knee: Secondary | ICD-10-CM

## 2021-01-08 NOTE — Telephone Encounter (Signed)
Called to check in with client for follow up.  Client reports and viewed in Epic that he has rescheduled his endocrinology appointment for 01/15/21.  He is in the process of obtaining documents to apply for [facility] Financial assistance. He is open to a referral for more nutritional education.  He is very interested in program that we are currently awaiting for funding that would assist client/dependent use of the [employer] for one year, which as discussed would give client access to the pool for low impact exercise due to his joint issues and weight. Plan to keep client updated on the progress of that upcoming program.   No further needs at this time.  [name] RN [name]

## 2021-01-08 NOTE — Progress Notes (Signed)
Patient [name], male DOB:08-Jun-1966, 55 y.o. RSW:[id number]  Chief Complaint  Patient presents with  . Knee Pain    Bilateral/ hurting     HPI  [name] is a 55 y.o. male who had bilateral knee pain.  He has no new trauma.  He has pain with prolonged standing and walking.  He cannot walk very far.  He has popping and swelling of the knees.  He has no giving way.   Body mass index is 62.95 kg/m.  The patient meets the AMA guidelines for Morbid (severe) obesity with a BMI > 40.0 and I have recommended weight loss.   ROS  Review of Systems  Constitutional: Positive for activity change.  HENT: Negative for congestion.   Respiratory: Positive for shortness of breath. Negative for cough.   Cardiovascular: Positive for leg swelling. Negative for chest pain.  Endocrine: Negative for cold intolerance.  Musculoskeletal: Positive for arthralgias, back pain, gait problem, joint swelling and myalgias.  Allergic/Immunologic: Positive for environmental allergies.  Neurological: Negative for numbness.  All other systems reviewed and are negative.   All other systems reviewed and are negative.  The following is a summary of the past history medically, past history surgically, known current medicines, social history and family history.  This information is gathered electronically by the computer from prior information and documentation.  I review this each visit and have found including this information at this point in the chart is beneficial and informative.    Past Medical History:  Diagnosis Date  . Arthritis   . Diabetes mellitus without complication ([place])    diagnosed about age 73  . Gout   . Hypertension    boarderline    Past Surgical History:  Procedure Laterality Date  . HERNIA REPAIR  2008  . MASS EXCISION Left age 53   L thigh- benign    Family History  Problem Relation Age of Onset  . Cancer Mother   . Diabetes Mother   . Heart failure Father   .  Hypertension Father   . Diabetes Father   . Heart attack Father     Social History Social History   Tobacco Use  . Smoking status: Former Smoker    Packs/day: 0.50    Years: 5.00    Pack years: 2.50    Types: Cigarettes    Quit date: 1990    Years since quitting: 32.3  . Smokeless tobacco: Never Used  Vaping Use  . Vaping Use: Never used  Substance Use Topics  . Alcohol use: No  . Drug use: No    No Known Allergies  Current Outpatient Medications  Medication Sig Dispense Refill  . amLODipine (NORVASC) 5 MG tablet Take 1 tablet (5 mg total) by mouth daily. 90 tablet 0  . atorvastatin (LIPITOR) 20 MG tablet Take 1 tablet (20 mg total) by mouth daily. 90 tablet 0  . furosemide (LASIX) 20 MG tablet Take 1 tablet (20 mg total) by mouth daily. 30 tablet 1  . insulin glargine (LANTUS) 100 UNIT/ML injection Inject 0.7 mLs (70 Units total) into the skin at bedtime. 30 mL 3  . Insulin Glulisine (APIDRA SOLOSTAR [place]) Inject 12-13 Units into the skin in the morning, at noon, and at bedtime.    [name] lisinopril (ZESTRIL) 20 MG tablet Take 1 tablet (20 mg total) by mouth daily. 90 tablet 0  . metFORMIN (GLUCOPHAGE) 1000 MG tablet Take 1 tablet (1,000 mg total) by mouth 2 (two) times daily  with a meal. 180 tablet 3  . metoprolol tartrate (LOPRESSOR) 100 MG tablet TAKE 1 Tablet  BY MOUTH TWICE DAILY 180 tablet 0  . oxyCODONE-acetaminophen (PERCOCET) 10-325 MG tablet Take 1 tablet by mouth every 4 (four) hours as needed for pain. 170 tablet 0  . sitaGLIPtin (JANUVIA) 100 MG tablet Take 1 tablet (100 mg total) by mouth daily. 90 tablet 0   No current facility-administered medications for this visit.     Physical Exam  Blood pressure 124/67, pulse 91, height 5\' 8"  (1.727 m), weight (!) 414 lb (187.8 kg).  Constitutional: overall normal hygiene, normal nutrition, well developed, normal grooming, normal body habitus. Assistive device:none  Musculoskeletal: gait and station Limp waddling type  gait, muscle tone and strength are normal, no tremors or atrophy is present.  .  Neurological: coordination overall normal.  Deep tendon reflex/nerve stretch intact.  Sensation normal.  Cranial nerves II-XII intact.   Skin:   Normal overall no scars, lesions, ulcers or rashes. No psoriasis.  Psychiatric: Alert and oriented x 3.  Recent memory intact, remote memory unclear.  Normal mood and affect. Well groomed.  Good eye contact.  Cardiovascular: overall no swelling, no varicosities, no edema bilaterally, normal temperatures of the legs and arms, no clubbing, cyanosis and good capillary refill.  Lymphatic: palpation is normal.  Both knees have crepitus, effusion, pain, ROM right 0 to 100, left 0 to 105.  All other systems reviewed and are negative   The patient has been educated about the nature of the problem(s) and counseled on treatment options.  The patient appeared to understand what I have discussed and is in agreement with it.  Encounter Diagnoses  Name Primary?  . Chronic pain of right knee Yes  . Chronic pain of left knee   . Morbid obesity due to excess calories ([place])   . Body mass index 60.0-69.9, adult [facility])     PLAN Call if any problems.  Precautions discussed.  Continue current medications.   Return to clinic 3 months   Electronically Signed Sanjuana Kava, MD 5/5/20228:34 AM

## 2021-01-12 ENCOUNTER — Encounter: Payer: Self-pay | Admitting: Physician Assistant

## 2021-01-15 ENCOUNTER — Ambulatory Visit: Payer: Self-pay | Admitting: Nurse Practitioner

## 2021-01-15 NOTE — Patient Instructions (Incomplete)

## 2021-01-21 ENCOUNTER — Encounter: Payer: Self-pay | Admitting: Nurse Practitioner

## 2021-01-21 ENCOUNTER — Other Ambulatory Visit: Payer: Self-pay

## 2021-01-21 ENCOUNTER — Ambulatory Visit (INDEPENDENT_AMBULATORY_CARE_PROVIDER_SITE_OTHER): Payer: Self-pay | Admitting: Nurse Practitioner

## 2021-01-21 VITALS — BP 134/82 | HR 63 | Ht 68.0 in | Wt >= 6400 oz

## 2021-01-21 DIAGNOSIS — E782 Mixed hyperlipidemia: Secondary | ICD-10-CM

## 2021-01-21 DIAGNOSIS — I10 Essential (primary) hypertension: Secondary | ICD-10-CM

## 2021-01-21 DIAGNOSIS — E1165 Type 2 diabetes mellitus with hyperglycemia: Secondary | ICD-10-CM

## 2021-01-21 LAB — POCT GLYCOSYLATED HEMOGLOBIN (HGB A1C): Hemoglobin A1C: 14.9 % — AB (ref 4.0–5.6)

## 2021-01-21 MED ORDER — INSULIN GLARGINE 100 UNIT/ML ~~LOC~~ SOLN: 80.0000 [IU] | Freq: Every day | SUBCUTANEOUS | 3 refills | Status: DC

## 2021-01-21 NOTE — Progress Notes (Signed)
01/21/2021, 3:35 PM      Endocrinology Follow Up Visit  Subjective:    Patient ID: [name], male    DOB: 1966/01/24.  [name] is being seen in follow up for management of currently uncontrolled symptomatic diabetes requested by  [name], PA-C.   Past Medical History:  Diagnosis Date  . Arthritis   . Diabetes mellitus without complication ([place])    diagnosed about age 56  . Gout   . Hypertension    [place]    Past Surgical History:  Procedure Laterality Date  . HERNIA REPAIR  2008  . MASS EXCISION Left age 64   L thigh- benign    Social History   Socioeconomic History  . Marital status: Single    Spouse name: Not on file  . Number of children: Not on file  . Years of education: Not on file  . Highest education level: Not on file  Occupational History  . Not on file  Tobacco Use  . Smoking status: Former Smoker    Packs/day: 0.50    Years: 5.00    Pack years: 2.50    Types: Cigarettes    Quit date: 1990    Years since quitting: 32.3  . Smokeless tobacco: Never Used  Vaping Use  . Vaping Use: Never used  Substance and Sexual Activity  . Alcohol use: No  . Drug use: No  . Sexual activity: Not on file  Other Topics Concern  . Not on file  Social History Narrative  . Not on file   Social Determinants of Health   Financial Resource Strain: Not on file  Food Insecurity: Not on file  Transportation Needs: Not on file  Physical Activity: Not on file  Stress: Not on file  Social Connections: Not on file    Family History  Problem Relation Age of Onset  . Cancer Mother   . Diabetes Mother   . Heart failure Father   . Hypertension Father   . Diabetes Father   . Heart attack Father     Outpatient Encounter Medications as of 01/21/2021  Medication Sig  . amLODipine (NORVASC) 5 MG tablet Take 1 tablet (5 mg total) by mouth daily.  [name] atorvastatin (LIPITOR) 20 MG  tablet Take 1 tablet (20 mg total) by mouth daily.  . furosemide (LASIX) 20 MG tablet Take 1 tablet (20 mg total) by mouth daily.  . insulin glargine (LANTUS) 100 UNIT/ML injection Inject 0.8 mLs (80 Units total) into the skin at bedtime.  . Insulin Glulisine (APIDRA SOLOSTAR [place]) Inject 12-18 Units into the skin in the morning, at noon, and at bedtime.  [name] lisinopril (ZESTRIL) 20 MG tablet Take 1 tablet (20 mg total) by mouth daily.  . metFORMIN (GLUCOPHAGE) 1000 MG tablet Take 1 tablet (1,000 mg total) by mouth 2 (two) times daily with a meal.  . metoprolol tartrate (LOPRESSOR) 100 MG tablet TAKE 1 Tablet  BY MOUTH TWICE DAILY  . oxyCODONE-acetaminophen (PERCOCET) 10-325 MG tablet Take 1 tablet by mouth every 4 (four) hours as needed for pain.  . sitaGLIPtin (JANUVIA) 100 MG tablet Take 1 tablet (100 mg total) by mouth daily.  . [DISCONTINUED] insulin glargine (  LANTUS) 100 UNIT/ML injection Inject 0.7 mLs (70 Units total) into the skin at bedtime.   No facility-administered encounter medications on file as of 01/21/2021.    ALLERGIES: No Known Allergies  VACCINATION STATUS: Immunization History  Administered Date(s) Administered  . Influenza,inj,Quad PF,6+ Mos 06/30/2017  . Moderna Sars-Covid-2 Vaccination 04/01/2020, 04/29/2020    Diabetes He presents for his follow-up diabetic visit. He has type 2 diabetes mellitus. Onset time: He was diagnosed at approximate age of 4 years. His disease course has been worsening. There are no hypoglycemic associated symptoms. Pertinent negatives for hypoglycemia include no confusion, headaches, pallor or seizures. Associated symptoms include blurred vision, fatigue, polydipsia, polyphagia and polyuria. Pertinent negatives for diabetes include no chest pain and no weakness. There are no hypoglycemic complications. Symptoms are worsening. There are no diabetic complications. Risk factors for coronary artery disease include dyslipidemia, diabetes mellitus,  hypertension, male sex, obesity and sedentary lifestyle. Current diabetic treatment includes intensive insulin program and oral agent (dual therapy). He is compliant with treatment some of the time (has difficulty affording medication- gets through free clinic). His weight is increasing steadily. He is following a generally unhealthy diet. When asked about meal planning, he reported none. He has not had a previous visit with a dietitian. He never participates in exercise. His breakfast blood glucose range is generally >200 mg/dl. His lunch blood glucose range is generally >200 mg/dl. His dinner blood glucose range is generally >200 mg/dl. His bedtime blood glucose range is generally >200 mg/dl. His overall blood glucose range is >200 mg/dl. (He presents today with his meter, no logs, showing severe hyperglycemia both fasting and postprandial.  His POCT A1c today is 14.9%, worsening from previous visit of 10.4%.  He has missed several appointments since last visit and admits he has just recently restarted taking his medications as prescribed.  There are no episodes of hypoglycemia noted.  Analysis of his meter shows 7-day average of 417, 14-day average of 404, and 30-day average of 407. ) An ACE inhibitor/angiotensin II receptor blocker is being taken. He does not see a podiatrist.Eye exam is current.  Hyperlipidemia This is a chronic problem. The current episode started more than 1 year ago. The problem is uncontrolled. Recent lipid tests were reviewed and are high. Exacerbating diseases include diabetes and obesity. Factors aggravating his hyperlipidemia include beta blockers and fatty foods. Pertinent negatives include no chest pain, myalgias or shortness of breath. Current antihyperlipidemic treatment includes statins. The current treatment provides moderate improvement of lipids. Compliance problems include adherence to diet, adherence to exercise and medication cost.  Risk factors for coronary artery disease  include dyslipidemia, diabetes mellitus, family history, hypertension, male sex, obesity and a sedentary lifestyle.  Hypertension This is a chronic problem. The current episode started more than 1 year ago. The problem is controlled. Associated symptoms include blurred vision and malaise/fatigue. Pertinent negatives include no chest pain, headaches, neck pain, palpitations or shortness of breath. There are no associated agents to hypertension. Risk factors for coronary artery disease include dyslipidemia, diabetes mellitus, male gender, obesity and sedentary lifestyle. Past treatments include ACE inhibitors and beta blockers. The current treatment provides mild improvement. Compliance problems include medication cost, diet and exercise.     Review of systems  Constitutional: + steadily increasing body weight,  current Body mass index is 62.64 kg/m., + fatigue, no subjective hyperthermia, no subjective hypothermia Eyes: + blurry vision, no xerophthalmia ENT: no sore throat, no nodules palpated in throat, no dysphagia/odynophagia, no hoarseness Cardiovascular: no  chest pain, no shortness of breath, no palpitations, no leg swelling Respiratory: no cough, no shortness of breath Gastrointestinal: no nausea/vomiting/diarrhea Genitourinary: + polyuria Musculoskeletal: + generalized muscle/joint aches Skin: no rashes, no hyperemia Neurological: no tremors, no numbness, no tingling, no dizziness Psychiatric: no depression, no anxiety    Objective:    BP 134/82   Pulse 63   Ht 5\' 8"  (1.727 m)   Wt (!) 412 lb (186.9 kg)   BMI 62.64 kg/m   Wt Readings from Last 3 Encounters:  01/21/21 (!) 412 lb (186.9 kg)  01/08/21 (!) 414 lb (187.8 kg)  01/05/21 (!) 402 lb (182.3 kg)    BP Readings from Last 3 Encounters:  01/21/21 134/82  01/08/21 124/67  01/05/21 128/79     Physical Exam- Limited  Constitutional:  Body mass index is 62.64 kg/m. , not in acute distress, normal state of  mind Eyes:  EOMI, no exophthalmos Neck: Supple Cardiovascular: RRR, no murmurs, rubs, or gallops, no edema Respiratory: Adequate breathing efforts, no crackles, rales, rhonchi, or wheezing Musculoskeletal: no gross deformities, strength intact in all four extremities, no gross restriction of joint movements Skin:  no rashes, no hyperemia Neurological: no tremor with outstretched hands   CMP ( most recent) CMP     Component Value Date/Time   NA 133 (L) 01/02/2021 1150   K 5.3 (H) 01/02/2021 1150   CL 97 (L) 01/02/2021 1150   CO2 26 01/02/2021 1150   GLUCOSE 293 (H) 01/02/2021 1150   BUN 17 01/02/2021 1150   CREATININE 0.79 01/02/2021 1150   CREATININE 1.42 (H) 09/08/2017 1540   CALCIUM 9.0 01/02/2021 1150   PROT 7.6 01/02/2021 1150   ALBUMIN 3.2 (L) 01/02/2021 1150   AST 31 01/02/2021 1150   ALT 38 01/02/2021 1150   ALKPHOS 166 (H) 01/02/2021 1150   BILITOT 0.8 01/02/2021 1150   GFRNONAA >60 01/02/2021 1150   GFRAA >60 03/17/2020 1200     Diabetic Labs (most recent): Lab Results  Component Value Date   HGBA1C 14.9 (A) 01/21/2021   HGBA1C 10.4 (A) 05/21/2020   HGBA1C 10.9 (H) 10/05/2019     Lipid Panel ( most recent) Lipid Panel     Component Value Date/Time   CHOL 187 01/02/2021 1150   TRIG 119 01/02/2021 1150   HDL 39 (L) 01/02/2021 1150   CHOLHDL 4.8 01/02/2021 1150   VLDL 24 01/02/2021 1150   LDLCALC 124 (H) 01/02/2021 1150      Lab Results  Component Value Date   TSH 0.532 06/28/2017       Assessment & Plan:   1) Uncontrolled type 2 diabetes mellitus with hyperglycemia (H[facility])  - [name] has currently uncontrolled symptomatic type 2 DM since  55 years of age.  He presents today with his meter, no logs, showing severe hyperglycemia both fasting and postprandial.  His POCT A1c today is 14.9%, worsening from previous visit of 10.4%.  He has missed several appointments since last visit and admits he has just recently restarted taking his  medications as prescribed.  There are no episodes of hypoglycemia noted.  Analysis of his meter shows 7-day average of 417, 14-day average of 404, and 30-day average of 407.  -Recent labs reviewed.  - I had a long discussion with him about the progressive nature of diabetes and the pathology behind its complications. -his diabetes is complicated by obesity/sedentary life, inadequate insurance coverage,  And he remains at a high risk for more acute and chronic complications  which include CAD, CVA, CKD, retinopathy, and neuropathy. These are all discussed in detail with him.  - Nutritional counseling repeated at each appointment due to patients tendency to fall back in to old habits.  - The patient admits there is a room for improvement in their diet and drink choices. -  Suggestion is made for the patient to avoid simple carbohydrates from their diet including Cakes, Sweet Desserts / Pastries, Ice Cream, Soda (diet and regular), Sweet Tea, Candies, Chips, Cookies, Sweet Pastries, Store Bought Juices, Alcohol in Excess of 1-2 drinks a day, Artificial Sweeteners, Coffee Creamer, and "Sugar-free" Products. This will help patient to have stable blood glucose profile and potentially avoid unintended weight gain.   - I encouraged the patient to switch to unprocessed or minimally processed complex starch and increased protein intake (animal or plant source), fruits, and vegetables.   - Patient is advised to stick to a routine mealtimes to eat 3 meals a day and avoid unnecessary snacks (to snack only to correct hypoglycemia).  - he will be scheduled with Jearld Fenton, RDN, CDE for diabetes education.  - I have approached him with the following individualized plan to manage  his diabetes and patient agrees:   -Given his recent loss of control, we discussed the need for him to re-engage in his diabetes management to avoid life-altering consequences of uncontrolled hyperglycemia.  He is advised to increase  his Lantus to 80 units SQ nightly, increase his Apidra to 12-18 units TID with meals if glucose is above 90 and he is eating (specific instructions on how to titrate insulin dose based on glucose readings given to patient in writing).  He is advised to continue Metformin 1000 mg po twice daily with meals and Januvia 100 mg po daily.     He is encouraged consistently monitor blood glucose 4 times per day, before meals and at bedtime and report to the clinic if readings are less than 70 or greater than 300 for 3 tests in a row.  - he will be considered for GLP-1 receptor agonist instead of DPP 4 inhibitor  as appropriate next visit.  - Specific targets for  A1c;  LDL, HDL,  and Triglycerides were discussed with the patient.  2) Blood Pressure /Hypertension:   His blood pressure is controlled to target.  He is advised to continue Lisinopril 20 mg po daily, and Metoprolol 100 mg po twice daily.    3) Lipids/Hyperlipidemia:   His recent lipid panel from 01/02/21 shows uncontrolled LDL of 124 (improving slightly).  He is advised to continue Atorvastatin 20 mg po daily at bedtime.  Side effects and precautions discussed with him.    4)  Weight/Diet:  His Body mass index is 62.64 kg/m.-   clearly complicating his diabetes care.   he is  a candidate for modest weight loss. I discussed with him the fact that loss of 5 - 10% of his  current body weight will have the most impact on his diabetes management.  Exercise, and detailed carbohydrates information provided  -  detailed on discharge instructions.  He is a good candidate for bariatric surgery, however at this point he has no insurance.  5) Chronic Care/Health Maintenance: -he is on ACEI/ARB and Statin medications and  is encouraged to initiate and continue to follow up with Ophthalmology, Dentist,  Podiatrist at least yearly or according to recommendations, and advised to   stay away from smoking. I have recommended yearly flu vaccine and pneumonia  vaccine  at least every 5 years; moderate intensity exercise for up to 150 minutes weekly; and  sleep for at least 7 hours a day.  - he is advised to maintain close follow up with [name], PA-C for primary care needs, as well as his other providers for optimal and coordinated care.     I spent 30 minutes in the care of the patient today including review of labs from [place], Lipids, Thyroid Function, Hematology (current and previous including abstractions from other facilities); face-to-face time discussing  his blood glucose readings/logs, discussing hypoglycemia and hyperglycemia episodes and symptoms, medications doses, his options of short and long term treatment based on the latest standards of care / guidelines;  discussion about incorporating lifestyle medicine;  and documenting the encounter.    Please refer to Patient Instructions for Blood Glucose Monitoring and Insulin/Medications Dosing Guide"  in media tab for additional information. Please  also refer to " Patient Self Inventory" in the Media  tab for reviewed elements of pertinent patient history.  [name] participated in the discussions, expressed understanding, and voiced agreement with the above plans.  All questions were answered to his satisfaction. he is encouraged to contact clinic should he have any questions or concerns prior to his return visit.   Follow up plan: - Return in about 2 weeks (around 02/04/2021) for Diabetes F/U, Bring meter and logs, No previsit labs.  Rayetta Pigg, [facility] [address] [place], [place] [address] Phone: [phone] Fax: [phone]  01/21/2021, 3:35 PM

## 2021-01-21 NOTE — Patient Instructions (Signed)

## 2021-01-22 ENCOUNTER — Telehealth: Payer: Self-pay | Admitting: Orthopaedic Surgery

## 2021-01-22 MED ORDER — OXYCODONE-ACETAMINOPHEN 10-325 MG PO TABS: 1.0000 | ORAL_TABLET | ORAL | 0 refills | Status: DC | PRN

## 2021-01-22 NOTE — Telephone Encounter (Signed)
Patient requests refill: oxyCODONE-acetaminophen (PERCOCET) 10-325 MG tablet 170 tablet      [facility], [place] 

## 2021-01-26 ENCOUNTER — Ambulatory Visit: Payer: Self-pay | Admitting: Podiatry

## 2021-02-05 ENCOUNTER — Ambulatory Visit: Payer: Self-pay | Admitting: Nurse Practitioner

## 2021-02-18 ENCOUNTER — Telehealth: Payer: Self-pay | Admitting: Orthopaedic Surgery

## 2021-02-18 NOTE — Telephone Encounter (Signed)
Completed weekly update related to BP health coaching and the remote monitoring program

## 2021-02-18 NOTE — Telephone Encounter (Signed)
Patient called request refill on pain medicine   oxyCODONE-acetaminophen (PERCOCET) 10-325 MG tablet   Pharmacy : [facility] [place]

## 2021-02-19 MED ORDER — OXYCODONE-ACETAMINOPHEN 10-325 MG PO TABS: 1.0000 | ORAL_TABLET | ORAL | 0 refills | Status: DC | PRN

## 2021-02-26 ENCOUNTER — Other Ambulatory Visit: Payer: Self-pay

## 2021-02-26 ENCOUNTER — Ambulatory Visit (INDEPENDENT_AMBULATORY_CARE_PROVIDER_SITE_OTHER): Payer: Self-pay | Admitting: Nurse Practitioner

## 2021-02-26 ENCOUNTER — Encounter: Payer: Self-pay | Admitting: Nurse Practitioner

## 2021-02-26 VITALS — BP 128/81 | HR 81 | Ht 68.0 in | Wt >= 6400 oz

## 2021-02-26 DIAGNOSIS — I10 Essential (primary) hypertension: Secondary | ICD-10-CM

## 2021-02-26 DIAGNOSIS — E1165 Type 2 diabetes mellitus with hyperglycemia: Secondary | ICD-10-CM

## 2021-02-26 DIAGNOSIS — E782 Mixed hyperlipidemia: Secondary | ICD-10-CM

## 2021-02-26 LAB — POCT UA - MICROALBUMIN
Creatinine, POC: 200 mg/dL
Microalbumin Ur, POC: 80 mg/L

## 2021-02-26 MED ORDER — METFORMIN HCL ER 500 MG PO TB24: 1000.0000 mg | ORAL_TABLET | Freq: Every day | ORAL | 3 refills | Status: DC

## 2021-02-26 NOTE — Patient Instructions (Signed)

## 2021-02-26 NOTE — Progress Notes (Signed)
02/26/2021, 4:19 PM      Endocrinology Follow Up Visit  Subjective:    Patient ID: [name], male    DOB: August 06, 1966.  [name] is being seen in follow up for management of currently uncontrolled symptomatic diabetes requested by  [name], PA-C.   Past Medical History:  Diagnosis Date   Arthritis    Diabetes mellitus without complication ([place])    diagnosed about age 55   Gout    Hypertension    boarderline    Past Surgical History:  Procedure Laterality Date   HERNIA REPAIR  2008   MASS EXCISION Left age 41   L thigh- benign    Social History   Socioeconomic History   Marital status: Single    Spouse name: Not on file   Number of children: Not on file   Years of education: Not on file   Highest education level: Not on file  Occupational History   Not on file  Tobacco Use   Smoking status: Former    Packs/day: 0.50    Years: 5.00    Pack years: 2.50    Types: Cigarettes    Quit date: 6    Years since quitting: 32.4   Smokeless tobacco: Never  Vaping Use   Vaping Use: Never used  Substance and Sexual Activity   Alcohol use: No   Drug use: No   Sexual activity: Not on file  Other Topics Concern   Not on file  Social History Narrative   Not on file   Social Determinants of Health   Financial Resource Strain: Not on file  Food Insecurity: Not on file  Transportation Needs: Not on file  Physical Activity: Not on file  Stress: Not on file  Social Connections: Not on file    Family History  Problem Relation Age of Onset   Cancer Mother    Diabetes Mother    Heart failure Father    Hypertension Father    Diabetes Father    Heart attack Father     Outpatient Encounter Medications as of 02/26/2021  Medication Sig   metFORMIN (GLUCOPHAGE XR) 500 MG 24 hr tablet Take 2 tablets (1,000 mg total) by mouth daily with breakfast.   amLODipine (NORVASC) 5 MG tablet Take 1  tablet (5 mg total) by mouth daily.   atorvastatin (LIPITOR) 20 MG tablet Take 1 tablet (20 mg total) by mouth daily.   furosemide (LASIX) 20 MG tablet Take 1 tablet (20 mg total) by mouth daily.   insulin glargine (LANTUS) 100 UNIT/ML injection Inject 0.8 mLs (80 Units total) into the skin at bedtime.   Insulin Glulisine (APIDRA SOLOSTAR [place]) Inject 16-22 Units into the skin in the morning, at noon, and at bedtime.   lisinopril (ZESTRIL) 20 MG tablet Take 1 tablet (20 mg total) by mouth daily.   metoprolol tartrate (LOPRESSOR) 100 MG tablet TAKE 1 Tablet  BY MOUTH TWICE DAILY   oxyCODONE-acetaminophen (PERCOCET) 10-325 MG tablet Take 1 tablet by mouth every 4 (four) hours as needed for pain.   sitaGLIPtin (JANUVIA) 100 MG tablet Take 1 tablet (100 mg total) by mouth daily.   [DISCONTINUED] metFORMIN (GLUCOPHAGE) 1000 MG tablet  Take 1 tablet (1,000 mg total) by mouth 2 (two) times daily with a meal.   No facility-administered encounter medications on file as of 02/26/2021.    ALLERGIES: No Known Allergies  VACCINATION STATUS: Immunization History  Administered Date(s) Administered   Influenza,inj,Quad PF,6+ Mos 06/30/2017   Moderna Sars-Covid-2 Vaccination 04/01/2020, 04/29/2020    Diabetes He presents for his follow-up diabetic visit. He has type 2 diabetes mellitus. Onset time: He was diagnosed at approximate age of 55 years. His disease course has been stable. There are no hypoglycemic associated symptoms. Pertinent negatives for hypoglycemia include no confusion, headaches, pallor or seizures. Associated symptoms include fatigue, polydipsia, polyphagia and polyuria. Pertinent negatives for diabetes include no blurred vision, no chest pain and no weakness. There are no hypoglycemic complications. Symptoms are stable. There are no diabetic complications. Risk factors for coronary artery disease include dyslipidemia, diabetes mellitus, hypertension, male sex, obesity and sedentary lifestyle.  Current diabetic treatment includes intensive insulin program and oral agent (dual therapy). He is compliant with treatment most of the time. His weight is fluctuating minimally. He is following a generally unhealthy diet. When asked about meal planning, he reported none. He has not had a previous visit with a dietitian. He never participates in exercise. His home blood glucose trend is fluctuating minimally. His breakfast blood glucose range is generally >200 mg/dl. His overall blood glucose range is >200 mg/dl. (He presents today with his meter, no logs, showing persistently high glycemic profile despite starting his medications back.  Analysis of his glucometer shows 7-day average of 438 and 14-day average of 434.  He admits he has not changed his diet much and is consuming pretty much whatever he wants.  No episodes of hypoglycemia noted.) An ACE inhibitor/angiotensin II receptor blocker is being taken. He does not see a podiatrist.Eye exam is current.  Hyperlipidemia This is a chronic problem. The current episode started more than 1 year ago. The problem is uncontrolled. Recent lipid tests were reviewed and are high. Exacerbating diseases include diabetes and obesity. Factors aggravating his hyperlipidemia include beta blockers and fatty foods. Pertinent negatives include no chest pain, myalgias or shortness of breath. Current antihyperlipidemic treatment includes statins. The current treatment provides moderate improvement of lipids. Compliance problems include adherence to diet, adherence to exercise and medication cost.  Risk factors for coronary artery disease include dyslipidemia, diabetes mellitus, family history, hypertension, male sex, obesity and a sedentary lifestyle.  Hypertension This is a chronic problem. The current episode started more than 1 year ago. The problem has been resolved since onset. The problem is controlled. Pertinent negatives include no blurred vision, chest pain, headaches,  malaise/fatigue, neck pain, palpitations or shortness of breath. There are no associated agents to hypertension. Risk factors for coronary artery disease include dyslipidemia, diabetes mellitus, male gender, obesity and sedentary lifestyle. Past treatments include ACE inhibitors, beta blockers, calcium channel blockers and diuretics. The current treatment provides mild improvement. Compliance problems include medication cost, diet and exercise.    Review of systems  Constitutional: + steadily increasing body weight,  current Body mass index is 63.71 kg/m., + fatigue, no subjective hyperthermia, no subjective hypothermia Eyes: + blurry vision, no xerophthalmia ENT: no sore throat, no nodules palpated in throat, no dysphagia/odynophagia, no hoarseness Cardiovascular: no chest pain, no shortness of breath, no palpitations, no leg swelling Respiratory: no cough, no shortness of breath Gastrointestinal: no nausea/vomiting/diarrhea Genitourinary: + polyuria Musculoskeletal: + generalized muscle/joint aches Skin: no rashes, no hyperemia Neurological: no tremors, no numbness, no  tingling, no dizziness Psychiatric: no depression, no anxiety    Objective:    BP 128/81   Pulse 81   Ht 5\' 8"  (1.727 m)   Wt (!) 419 lb (190.1 kg)   BMI 63.71 kg/m   Wt Readings from Last 3 Encounters:  02/26/21 (!) 419 lb (190.1 kg)  01/21/21 (!) 412 lb (186.9 kg)  01/08/21 (!) 414 lb (187.8 kg)    BP Readings from Last 3 Encounters:  02/26/21 128/81  01/21/21 134/82  01/08/21 124/67    Physical Exam- Limited  Constitutional:  Body mass index is 63.71 kg/m. , not in acute distress, normal state of mind Eyes:  EOMI, no exophthalmos Neck: Supple Cardiovascular: RRR, no murmurs, rubs, or gallops, no edema Respiratory: Adequate breathing efforts, no crackles, rales, rhonchi, or wheezing Musculoskeletal: no gross deformities, strength intact in all four extremities, no gross restriction of joint  movements Skin:  no rashes, no hyperemia Neurological: no tremor with outstretched hands   CMP ( most recent) CMP     Component Value Date/Time   NA 133 (L) 01/02/2021 1150   K 5.3 (H) 01/02/2021 1150   CL 97 (L) 01/02/2021 1150   CO2 26 01/02/2021 1150   GLUCOSE 293 (H) 01/02/2021 1150   BUN 17 01/02/2021 1150   CREATININE 0.79 01/02/2021 1150   CREATININE 1.42 (H) 09/08/2017 1540   CALCIUM 9.0 01/02/2021 1150   PROT 7.6 01/02/2021 1150   ALBUMIN 3.2 (L) 01/02/2021 1150   AST 31 01/02/2021 1150   ALT 38 01/02/2021 1150   ALKPHOS 166 (H) 01/02/2021 1150   BILITOT 0.8 01/02/2021 1150   GFRNONAA >60 01/02/2021 1150   GFRAA >60 03/17/2020 1200     Diabetic Labs (most recent): Lab Results  Component Value Date   HGBA1C 14.9 (A) 01/21/2021   HGBA1C 10.4 (A) 05/21/2020   HGBA1C 10.9 (H) 10/05/2019     Lipid Panel ( most recent) Lipid Panel     Component Value Date/Time   CHOL 187 01/02/2021 1150   TRIG 119 01/02/2021 1150   HDL 39 (L) 01/02/2021 1150   CHOLHDL 4.8 01/02/2021 1150   VLDL 24 01/02/2021 1150   LDLCALC 124 (H) 01/02/2021 1150      Lab Results  Component Value Date   TSH 0.532 06/28/2017       Assessment & Plan:   1) Uncontrolled type 2 diabetes mellitus with hyperglycemia ([facility])  - [name] has currently uncontrolled symptomatic type 2 DM since  55 years of age.  He presents today with his meter, no logs, showing persistently high glycemic profile despite starting his medications back.  Analysis of his glucometer shows 7-day average of 438 and 14-day average of 434.  He admits he has not changed his diet much and is consuming pretty much whatever he wants.  No episodes of hypoglycemia noted.  -Recent labs reviewed.  POCT UM today is 80.  - I had a long discussion with him about the progressive nature of diabetes and the pathology behind its complications. -his diabetes is complicated by obesity/sedentary life, inadequate insurance coverage,   And he remains at a high risk for more acute and chronic complications which include CAD, CVA, CKD, retinopathy, and neuropathy. These are all discussed in detail with him.  - Nutritional counseling repeated at each appointment due to patients tendency to fall back in to old habits.  - The patient admits there is a room for improvement in their diet and drink choices. -  Suggestion is made for the patient to avoid simple carbohydrates from their diet including Cakes, Sweet Desserts / Pastries, Ice Cream, Soda (diet and regular), Sweet Tea, Candies, Chips, Cookies, Sweet Pastries, Store Bought Juices, Alcohol in Excess of 1-2 drinks a day, Artificial Sweeteners, Coffee Creamer, and "Sugar-free" Products. This will help patient to have stable blood glucose profile and potentially avoid unintended weight gain.   - I encouraged the patient to switch to unprocessed or minimally processed complex starch and increased protein intake (animal or plant source), fruits, and vegetables.   - Patient is advised to stick to a routine mealtimes to eat 3 meals a day and avoid unnecessary snacks (to snack only to correct hypoglycemia).  - he will be scheduled with Jearld Fenton, RDN, CDE for diabetes education.  - I have approached him with the following individualized plan to manage  his diabetes and patient agrees:   -Given his recent loss of control, we discussed the need for him to re-engage in his diabetes management to avoid life-altering consequences of uncontrolled hyperglycemia.  It is imperative that he start improving his diet.    He is advised to continue his Lantus to 80 units SQ nightly, increase his Apidra to 16-22 units TID with meals if glucose is above 90 and he is eating (specific instructions on how to titrate insulin dose based on glucose readings given to patient in writing).  He is advised to continue Metformin 1000 mg po twice daily (due to GI side effects, will try switching to ER form to  Metformin 1000 mg ER daily with breakfast) and Januvia 100 mg po daily.     He is encouraged consistently monitor blood glucose 4 times per day, before meals and at bedtime and report to the clinic if readings are less than 70 or greater than 300 for 3 tests in a row.  - he will be considered for GLP-1 receptor agonist instead of DPP 4 inhibitor as appropriate next visit although affordability is an issues as he is uninsured.  - Specific targets for  A1c;  LDL, HDL,  and Triglycerides were discussed with the patient.  2) Blood Pressure /Hypertension:   His blood pressure is controlled to target.  He is advised to continue Norvasc 5 mg po daily, Lasix 20 mg po daily, Lisinopril 20 mg po daily, and Metoprolol 100 mg po twice daily.    3) Lipids/Hyperlipidemia:   His recent lipid panel from 01/02/21 shows uncontrolled LDL of 124 (improving slightly).  He is advised to continue Atorvastatin 20 mg po daily at bedtime.  Side effects and precautions discussed with him.    4)  Weight/Diet:  His Body mass index is 63.71 kg/m.-   clearly complicating his diabetes care.   he is  a candidate for modest weight loss. I discussed with him the fact that loss of 5 - 10% of his  current body weight will have the most impact on his diabetes management.  Exercise, and detailed carbohydrates information provided  -  detailed on discharge instructions.  He is a good candidate for bariatric surgery, however at this point he has no insurance.  5) Chronic Care/Health Maintenance: -he is on ACEI/ARB and Statin medications and is encouraged to initiate and continue to follow up with Ophthalmology, Dentist,  Podiatrist at least yearly or according to recommendations, and advised to stay away from smoking. I have recommended yearly flu vaccine and pneumonia vaccine at least every 5 years; moderate intensity exercise for up to  150 minutes weekly; and  sleep for at least 7 hours a day.  - he is advised to maintain close follow  up with [name], PA-C for primary care needs, as well as his other providers for optimal and coordinated care.       I spent 49 minutes in the care of the patient today including review of labs from [facility], Lipids, Thyroid Function, Hematology (current and previous including abstractions from other facilities); face-to-face time discussing  his blood glucose readings/logs, discussing hypoglycemia and hyperglycemia episodes and symptoms, medications doses, his options of short and long term treatment based on the latest standards of care / guidelines;  discussion about incorporating lifestyle medicine;  and documenting the encounter.    Please refer to Patient Instructions for Blood Glucose Monitoring and Insulin/Medications Dosing Guide"  in media tab for additional information. Please  also refer to " Patient Self Inventory" in the Media  tab for reviewed elements of pertinent patient history.  [name] participated in the discussions, expressed understanding, and voiced agreement with the above plans.  All questions were answered to his satisfaction. he is encouraged to contact clinic should he have any questions or concerns prior to his return visit.   Follow up plan: - Return in about 3 months (around 05/29/2021) for Diabetes F/U with A1c in office, No previsit labs, Bring meter and logs.  Rayetta Pigg, [facility] [address] [place], [place] [address] Phone: [phone] Fax: [phone]  02/26/2021, 4:19 PM

## 2021-03-04 ENCOUNTER — Telehealth: Payer: Self-pay

## 2021-03-04 NOTE — Telephone Encounter (Signed)
Called client for follow up. Client needs to renew his [facility] and possibly [facility]. Client also in need of CAFA. Arranged appointment for client 03/05/21 at 1:30 pm.   Will also meet with client for Strips for glucose monitoring. Reviewed latest labs and follow up visit with Endocrine. Discussed in great detail risks of uncontrolled Diabetes. Client's blood sugars in 400s. A1C rising. Discussed again about the [employer] program that [employer] is working on and client is interested. Discussed that client will need to be committed and utilize the opportunity. Will discuss further on 03/05/21.  Plan to discuss with D Luciana Axe needed docs for [employer]. Will request referral from provider for Dietician referral for DM education.  [place] [employer]

## 2021-03-19 ENCOUNTER — Telehealth: Payer: Self-pay | Admitting: Orthopaedic Surgery

## 2021-03-19 MED ORDER — OXYCODONE-ACETAMINOPHEN 10-325 MG PO TABS: 1.0000 | ORAL_TABLET | ORAL | 0 refills | Status: DC | PRN

## 2021-03-19 NOTE — Telephone Encounter (Signed)
Patient called for refill: oxyCODONE-acetaminophen (PERCOCET) 10-325 MG tablet 170 tablet  -[facility], Linna Hoff

## 2021-03-24 ENCOUNTER — Encounter (HOSPITAL_COMMUNITY): Payer: Self-pay | Admitting: Emergency Medicine

## 2021-03-24 ENCOUNTER — Other Ambulatory Visit: Payer: Self-pay

## 2021-03-24 ENCOUNTER — Other Ambulatory Visit: Payer: Self-pay | Admitting: Physician Assistant

## 2021-03-24 ENCOUNTER — Emergency Department (HOSPITAL_COMMUNITY)
Admission: EM | Admit: 2021-03-24 | Discharge: 2021-03-24 | Disposition: A | Discharge status: Home / Self Care | Payer: Self-pay | Attending: Emergency Medicine | Admitting: Emergency Medicine

## 2021-03-24 DIAGNOSIS — E785 Hyperlipidemia, unspecified: Secondary | ICD-10-CM

## 2021-03-24 DIAGNOSIS — Z79899 Other long term (current) drug therapy: Secondary | ICD-10-CM | POA: Insufficient documentation

## 2021-03-24 DIAGNOSIS — I10 Essential (primary) hypertension: Secondary | ICD-10-CM

## 2021-03-24 DIAGNOSIS — E1165 Type 2 diabetes mellitus with hyperglycemia: Secondary | ICD-10-CM

## 2021-03-24 DIAGNOSIS — E119 Type 2 diabetes mellitus without complications: Secondary | ICD-10-CM | POA: Insufficient documentation

## 2021-03-24 DIAGNOSIS — Z7984 Long term (current) use of oral hypoglycemic drugs: Secondary | ICD-10-CM | POA: Insufficient documentation

## 2021-03-24 DIAGNOSIS — Z87891 Personal history of nicotine dependence: Secondary | ICD-10-CM | POA: Insufficient documentation

## 2021-03-24 DIAGNOSIS — R252 Cramp and spasm: Secondary | ICD-10-CM | POA: Insufficient documentation

## 2021-03-24 DIAGNOSIS — Z794 Long term (current) use of insulin: Secondary | ICD-10-CM | POA: Insufficient documentation

## 2021-03-24 LAB — CBC WITH DIFFERENTIAL/PLATELET
Abs Immature Granulocytes: 0.11 10*3/uL — ABNORMAL HIGH (ref 0.00–0.07)
Basophils Absolute: 0.1 10*3/uL (ref 0.0–0.1)
Basophils Relative: 0 %
Eosinophils Absolute: 0.1 10*3/uL (ref 0.0–0.5)
Eosinophils Relative: 1 %
HCT: 46.8 % (ref 39.0–52.0)
Hemoglobin: 14.1 g/dL (ref 13.0–17.0)
Immature Granulocytes: 1 %
Lymphocytes Relative: 17 %
Lymphs Abs: 2.3 10*3/uL (ref 0.7–4.0)
MCH: 26.6 pg (ref 26.0–34.0)
MCHC: 30.1 g/dL (ref 30.0–36.0)
MCV: 88.1 fL (ref 80.0–100.0)
Monocytes Absolute: 1.4 10*3/uL — ABNORMAL HIGH (ref 0.1–1.0)
Monocytes Relative: 10 %
Neutro Abs: 9.6 10*3/uL — ABNORMAL HIGH (ref 1.7–7.7)
Neutrophils Relative %: 71 %
Platelets: 252 10*3/uL (ref 150–400)
RBC: 5.31 MIL/uL (ref 4.22–5.81)
RDW: 14.4 % (ref 11.5–15.5)
WBC: 13.6 10*3/uL — ABNORMAL HIGH (ref 4.0–10.5)
nRBC: 0 % (ref 0.0–0.2)

## 2021-03-24 LAB — BASIC METABOLIC PANEL
Anion gap: 8 (ref 5–15)
BUN: 13 mg/dL (ref 6–20)
CO2: 28 mmol/L (ref 22–32)
Calcium: 9.1 mg/dL (ref 8.9–10.3)
Chloride: 99 mmol/L (ref 98–111)
Creatinine, Ser: 0.76 mg/dL (ref 0.61–1.24)
GFR, Estimated: >60 mL/min (ref >60)
Glucose, Bld: 337 mg/dL — ABNORMAL HIGH (ref 70–99)
Potassium: 4.1 mmol/L (ref 3.5–5.1)
Sodium: 135 mmol/L (ref 135–145)

## 2021-03-24 LAB — MAGNESIUM: Magnesium: 2.3 mg/dL (ref 1.7–2.4)

## 2021-03-24 MED ORDER — CYCLOBENZAPRINE HCL 10 MG PO TABS
10.0000 mg | ORAL_TABLET | Freq: Once | ORAL | Status: AC
  Administered 2021-03-24: 10 mg via ORAL
  Filled 2021-03-24: qty 1

## 2021-03-24 MED ORDER — CYCLOBENZAPRINE HCL 10 MG PO TABS: 10.0000 mg | ORAL_TABLET | Freq: Three times a day (TID) | ORAL | 0 refills | Status: DC | PRN

## 2021-03-24 NOTE — ED Provider Notes (Signed)
[facility] EMERGENCY DEPARTMENT Provider Note   CSN: [id number] Arrival date & time: 03/24/21  0131     History Chief Complaint  Patient presents with   Body Cramping    [name] is a 55 y.o. male.  The history is provided by the patient.  He has history of hypertension, diabetes, gout and comes in because of cramps in his arms and legs which started after his nighttime dose of insulin at about 11:30 PM.  Cramps are intermittent but, severe when present.  He has not taken anything for the cramps.  This has happened before and usually after an injection of insulin.   Past Medical History:  Diagnosis Date   Arthritis    Diabetes mellitus without complication ([place])    diagnosed about age 65   Gout    Hypertension    boarderline    Patient Active Problem List   Diagnosis Date Noted   Mixed hyperlipidemia 10/23/2019   Exposure to COVID-19 virus 02/15/2019   Encounter for medication monitoring 67/61/9509   Diastolic dysfunction    History of gout    Essential hypertension, benign    Post-operative pain    Bacteremia due to Gram-positive bacteria    Uncontrolled type 2 diabetes mellitus with hyperglycemia ([place])    Right knee pain 01/08/2016   Left knee pain 01/08/2016   Morbid obesity due to excess calories ([place]) 01/08/2016    Past Surgical History:  Procedure Laterality Date   HERNIA REPAIR  2008   MASS EXCISION Left age 76   L thigh- benign       Family History  Problem Relation Age of Onset   Cancer Mother    Diabetes Mother    Heart failure Father    Hypertension Father    Diabetes Father    Heart attack Father     Social History   Tobacco Use   Smoking status: Former    Packs/day: 0.50    Years: 5.00    Pack years: 2.50    Types: Cigarettes    Quit date: 1990    Years since quitting: 32.5   Smokeless tobacco: Never  Vaping Use   Vaping Use: Never used  Substance Use Topics   Alcohol use: No   Drug use: No    Home Medications Prior  to Admission medications   Medication Sig Start Date End Date Taking? Authorizing Provider  amLODipine (NORVASC) 5 MG tablet Take 1 tablet (5 mg total) by mouth daily. 06/25/20   [name], PA-C  atorvastatin (LIPITOR) 20 MG tablet Take 1 tablet (20 mg total) by mouth daily. 06/25/20   [name], PA-C  furosemide (LASIX) 20 MG tablet Take 1 tablet (20 mg total) by mouth daily. 08/12/20   [name], PA-C  insulin glargine (LANTUS) 100 UNIT/ML injection Inject 0.8 mLs (80 Units total) into the skin at bedtime. 01/21/21   Brita Romp, NP  Insulin Glulisine (APIDRA SOLOSTAR [place]) Inject 16-22 Units into the skin in the morning, at noon, and at bedtime.    [provider]  lisinopril (ZESTRIL) 20 MG tablet Take 1 tablet (20 mg total) by mouth daily. 06/25/20   [name], PA-C  metFORMIN (GLUCOPHAGE XR) 500 MG 24 hr tablet Take 2 tablets (1,000 mg total) by mouth daily with breakfast. 02/26/21   Brita Romp, NP  metoprolol tartrate (LOPRESSOR) 100 MG tablet TAKE 1 Tablet  BY MOUTH TWICE DAILY 06/25/20   [name], PA-C  oxyCODONE-acetaminophen (PERCOCET) [phone]  MG tablet Take 1 tablet by mouth every 4 (four) hours as needed for pain. 03/19/21   Sanjuana Kava, MD  sitaGLIPtin (JANUVIA) 100 MG tablet Take 1 tablet (100 mg total) by mouth daily. 06/25/20   [name], PA-C    Allergies    Patient has no known allergies.  Review of Systems   Review of Systems  All other systems reviewed and are negative.  Physical Exam Updated Vital Signs BP (!) 188/130 Comment: hard to get accurate BP; pt wont hold still  Pulse (!) 113   Temp 97.9 F (36.6 C)   Resp (!) 22   Ht 5\' 8"  (1.727 m)   Wt (!) 174.6 kg   SpO2 95%   BMI 58.54 kg/m   Physical Exam Vitals and nursing note reviewed.  Morbidly obese 55 year old male, resting comfortably and in no acute distress. Vital signs are significant for elevated heart rate, blood pressure, respiratory  rate. Oxygen saturation is 95%, which is normal. Head is normocephalic and atraumatic. PERRLA, EOMI. Oropharynx is clear. Neck is nontender and supple without adenopathy or JVD. Back is nontender and there is no CVA tenderness. Lungs are clear without rales, wheezes, or rhonchi. Chest is nontender. Heart has regular rate and rhythm without murmur. Abdomen is soft, flat, nontender without masses or hepatosplenomegaly and peristalsis is normoactive. Extremities have 3+r edema, full range of motion is present.  Severe venous stasis changes are present with venous stasis ulcer present over the anterior aspect of the left lower leg. Skin is warm and dry without rash. Neurologic: Mental status is normal, cranial nerves are intact, moves all extremities equally.  ED Results / Procedures / Treatments   Labs (all labs ordered are listed, but only abnormal results are displayed) Labs Reviewed  CBC WITH DIFFERENTIAL/PLATELET - Abnormal; Notable for the following components:      Result Value   WBC 13.6 (*)    Neutro Abs 9.6 (*)    Monocytes Absolute 1.4 (*)    Abs Immature Granulocytes 0.11 (*)    All other components within normal limits  BASIC METABOLIC PANEL - Abnormal; Notable for the following components:   Glucose, Bld 337 (*)    All other components within normal limits  MAGNESIUM   Procedures Procedures   Medications Ordered in ED Medications  cyclobenzaprine (FLEXERIL) tablet 10 mg (has no administration in time range)    ED Course  I have reviewed the triage vital signs and the nursing notes.  Pertinent lab results that were available during my care of the patient were reviewed by me and considered in my medical decision making (see chart for details).    MDM Rules/Calculators/A&P                         Muscle spasms, consider electrolyte disturbance.  It is noted that he does take furosemide, so would be at risk for hypokalemia and hypomagnesemia.  Old records are  reviewed, and he has no relevant past visits.  Labs show normal potassium and magnesium.  He is given prescription for cyclobenzaprine and given prescription for same.  Follow-up with PCP.  Also advised to monitor his blood pressure at home.  Final Clinical Impression(s) / ED Diagnoses Final diagnoses:  Muscle cramps  Elevated blood pressure reading with diagnosis of hypertension    Rx / DC Orders ED Discharge Orders          Ordered    cyclobenzaprine (FLEXERIL) 10  MG tablet  3 times daily PRN        03/24/21 9211             Delora Fuel, MD 94/17/40 [phone]

## 2021-03-24 NOTE — ED Notes (Signed)
Lab called regarding labs

## 2021-03-24 NOTE — ED Notes (Signed)
Ambulatory to restroom without difficulty.

## 2021-03-24 NOTE — ED Notes (Signed)
Currently not cramping. Ambulatory to lobby .

## 2021-03-24 NOTE — Discharge Instructions (Addendum)
Your blood pressure was significantly elevated today.  Please monitor at home.  You should check your blood pressure once a day and keep a record of it.  Take that record with you when you see your primary care provider.  The information about what your blood pressure has been doing at home will help your primary care provider to decide if your blood pressure medication needs to be adjusted.

## 2021-03-24 NOTE — ED Triage Notes (Signed)
Pt c/o body cramping x one hour.

## 2021-03-26 ENCOUNTER — Telehealth: Payer: Self-pay

## 2021-03-26 NOTE — Telephone Encounter (Signed)
Called to follow up with client regarding recent ER Visit. Client reports he was having severe cramping in his legs and hands. He states his labs were normal and he was given muscle relaxer. He denies cramping today. He attributes this to possible high blood sugar 400s the night before and taking his apidra as well lantus. He states this has happened before. Discussed dehydration with higher glucose readings as well as medications that he is taking that can also contribute to dehydration. Recommended adequate hydration with water. Encouraged client to follow up with his primary care and or Endocrinologist if this persists. He reports his am blood sugar was 197 today. He does admit he has room to improve his blood sugars with medications, diet and exercise.   Plan: continue to follow up with client  Notify client when [facility] program starts , client is interested in low impact exercise in the pool due to joint issues. Will keep client updated.  [name] RN [facility]

## 2021-04-06 ENCOUNTER — Other Ambulatory Visit: Payer: Self-pay

## 2021-04-06 ENCOUNTER — Other Ambulatory Visit (HOSPITAL_COMMUNITY)
Admission: RE | Admit: 2021-04-06 | Discharge: 2021-04-06 | Disposition: A | Discharge status: Home / Self Care | Payer: Self-pay | Source: Ambulatory Visit | Attending: Physician Assistant | Admitting: Physician Assistant

## 2021-04-06 DIAGNOSIS — E1165 Type 2 diabetes mellitus with hyperglycemia: Secondary | ICD-10-CM | POA: Insufficient documentation

## 2021-04-06 DIAGNOSIS — E785 Hyperlipidemia, unspecified: Secondary | ICD-10-CM | POA: Insufficient documentation

## 2021-04-06 DIAGNOSIS — I10 Essential (primary) hypertension: Secondary | ICD-10-CM | POA: Insufficient documentation

## 2021-04-06 LAB — LIPID PANEL
Cholesterol: 204 mg/dL — ABNORMAL HIGH (ref 0–200)
HDL: 42 mg/dL (ref >40)
LDL Cholesterol: 137 mg/dL — ABNORMAL HIGH (ref 0–99)
Total CHOL/HDL Ratio: 4.9 RATIO
Triglycerides: 126 mg/dL (ref <150)
VLDL: 25 mg/dL (ref 0–40)

## 2021-04-06 LAB — COMPREHENSIVE METABOLIC PANEL
ALT: 26 U/L (ref 0–44)
AST: 18 U/L (ref 15–41)
Albumin: 3.2 g/dL — ABNORMAL LOW (ref 3.5–5.0)
Alkaline Phosphatase: 156 U/L — ABNORMAL HIGH (ref 38–126)
Anion gap: 9 (ref 5–15)
BUN: 10 mg/dL (ref 6–20)
CO2: 24 mmol/L (ref 22–32)
Calcium: 8.5 mg/dL — ABNORMAL LOW (ref 8.9–10.3)
Chloride: 98 mmol/L (ref 98–111)
Creatinine, Ser: 0.66 mg/dL (ref 0.61–1.24)
GFR, Estimated: >60 mL/min (ref >60)
Glucose, Bld: 392 mg/dL — ABNORMAL HIGH (ref 70–99)
Potassium: 4.4 mmol/L (ref 3.5–5.1)
Sodium: 131 mmol/L — ABNORMAL LOW (ref 135–145)
Total Bilirubin: 0.7 mg/dL (ref 0.3–1.2)
Total Protein: 7.8 g/dL (ref 6.5–8.1)

## 2021-04-07 ENCOUNTER — Ambulatory Visit: Payer: Self-pay | Admitting: Physician Assistant

## 2021-04-07 ENCOUNTER — Encounter: Payer: Self-pay | Admitting: Physician Assistant

## 2021-04-07 VITALS — BP 138/77 | HR 81 | Temp 97.1°F | Wt 393.0 lb

## 2021-04-07 DIAGNOSIS — I10 Essential (primary) hypertension: Secondary | ICD-10-CM

## 2021-04-07 DIAGNOSIS — E785 Hyperlipidemia, unspecified: Secondary | ICD-10-CM

## 2021-04-07 DIAGNOSIS — E1165 Type 2 diabetes mellitus with hyperglycemia: Secondary | ICD-10-CM

## 2021-04-07 DIAGNOSIS — S81802A Unspecified open wound, left lower leg, initial encounter: Secondary | ICD-10-CM

## 2021-04-07 DIAGNOSIS — F509 Eating disorder, unspecified: Secondary | ICD-10-CM

## 2021-04-07 MED ORDER — SITAGLIPTIN PHOSPHATE 100 MG PO TABS: 100.0000 mg | ORAL_TABLET | Freq: Every day | ORAL | 0 refills | Status: DC

## 2021-04-07 MED ORDER — METFORMIN HCL ER 500 MG PO TB24: 1000.0000 mg | ORAL_TABLET | Freq: Every day | ORAL | 1 refills | Status: DC

## 2021-04-07 MED ORDER — AMLODIPINE BESYLATE 5 MG PO TABS: 5.0000 mg | ORAL_TABLET | Freq: Every day | ORAL | 0 refills | Status: DC

## 2021-04-07 MED ORDER — LISINOPRIL 20 MG PO TABS: 20.0000 mg | ORAL_TABLET | Freq: Every day | ORAL | 0 refills | Status: DC

## 2021-04-07 MED ORDER — FUROSEMIDE 20 MG PO TABS: 20.0000 mg | ORAL_TABLET | Freq: Every day | ORAL | 1 refills | Status: DC

## 2021-04-07 MED ORDER — METOPROLOL TARTRATE 100 MG PO TABS: 100.0000 mg | ORAL_TABLET | Freq: Two times a day (BID) | ORAL | 0 refills | Status: DC

## 2021-04-07 MED ORDER — ATORVASTATIN CALCIUM 40 MG PO TABS: 40.0000 mg | ORAL_TABLET | Freq: Every day | ORAL | 1 refills | Status: DC

## 2021-04-07 NOTE — Progress Notes (Signed)
BP 138/77   Pulse 81   Temp (!) 97.1 F (36.2 C)   Wt (!) 393 lb (178.3 kg)   SpO2 97%   BMI 59.76 kg/m    Subjective:    Patient ID: [name], male    DOB: 1966/08/24, 55 y.o.   MRN: [id number]  HPI: [name] is a 55 y.o. male presenting on 04/07/2021 for Hypertension and Hyperlipidemia   HPI  Pt had a negative covid 19 screening questionnaire.  Chief Complaint  Patient presents with   Hypertension   Hyperlipidemia    Pt sees endocrinology for management of his DM.  Pt did not start taking the other metformin XR that the endocrinologist started in June.    He is still taking the regular formulation. He is watching a little bit of what he eats.   He says he still has trouble with his eating habits.  He says his Breathing is okay.  He recently renewed with [facility].    He is not seeing [place] for his eating disorder.   He says [facility] couldn't help him with that.  He has a wound on his LLE that he has been cleaning with alcohol.       Relevant past medical, surgical, family and social history reviewed and updated as indicated. Interim medical history since our last visit reviewed. Allergies and medications reviewed and updated.    Current Outpatient Medications:    amLODipine (NORVASC) 5 MG tablet, Take 1 tablet (5 mg total) by mouth daily., Disp: 90 tablet, Rfl: 0   atorvastatin (LIPITOR) 20 MG tablet, Take 1 tablet (20 mg total) by mouth daily., Disp: 90 tablet, Rfl: 0   cyclobenzaprine (FLEXERIL) 10 MG tablet, Take 1 tablet (10 mg total) by mouth 3 (three) times daily as needed for muscle spasms., Disp: 30 tablet, Rfl: 0   furosemide (LASIX) 20 MG tablet, Take 1 tablet (20 mg total) by mouth daily., Disp: 30 tablet, Rfl: 1   insulin glargine (LANTUS) 100 UNIT/ML injection, Inject 0.8 mLs (80 Units total) into the skin at bedtime., Disp: 30 mL, Rfl: 3   Insulin Glulisine (APIDRA SOLOSTAR ), Inject 16-22 Units into the skin in the morning, at noon, and at  bedtime., Disp: , Rfl:    lisinopril (ZESTRIL) 20 MG tablet, Take 1 tablet (20 mg total) by mouth daily., Disp: 90 tablet, Rfl: 0   metFORMIN (GLUCOPHAGE) 1000 MG tablet, Take 1,000 mg by mouth 2 (two) times daily with a meal., Disp: , Rfl:    metoprolol tartrate (LOPRESSOR) 100 MG tablet, TAKE 1 Tablet  BY MOUTH TWICE DAILY, Disp: 180 tablet, Rfl: 0   oxyCODONE-acetaminophen (PERCOCET) 10-325 MG tablet, Take 1 tablet by mouth every 4 (four) hours as needed for pain., Disp: 170 tablet, Rfl: 0   sitaGLIPtin (JANUVIA) 100 MG tablet, Take 1 tablet (100 mg total) by mouth daily., Disp: 90 tablet, Rfl: 0   metFORMIN (GLUCOPHAGE XR) 500 MG 24 hr tablet, Take 2 tablets (1,000 mg total) by mouth daily with breakfast. (Patient not taking: Reported on 04/07/2021), Disp: 90 tablet, Rfl: 3    Review of Systems  Per HPI unless specifically indicated above     Objective:    BP 138/77   Pulse 81   Temp (!) 97.1 F (36.2 C)   Wt (!) 393 lb (178.3 kg)   SpO2 97%   BMI 59.76 kg/m   Wt Readings from Last 3 Encounters:  04/07/21 (!) 393 lb (178.3 kg)  03/24/21 (!) 385 lb (174.6 kg)  02/26/21 (!) 419 lb (190.1 kg)    Physical Exam Constitutional:      General: He is not in acute distress.    Appearance: He is obese. He is not toxic-appearing.  HENT:     Head: Normocephalic and atraumatic.  Cardiovascular:     Rate and Rhythm: Normal rate and regular rhythm.  Pulmonary:     Effort: Pulmonary effort is normal. No respiratory distress.     Breath sounds: No wheezing or rhonchi.  Musculoskeletal:     Cervical back: Neck supple.  Skin:    General: Skin is warm and dry.     Comments: Wound LLE as pictured  Neurological:     Mental Status: He is alert and oriented to person, place, and time.  Psychiatric:        Attention and Perception: Attention normal.        Mood and Affect: Affect is not inappropriate.        Speech: Speech normal.        Behavior: Behavior normal. Behavior is cooperative.        Results for orders placed or performed during the hospital encounter of 04/06/21  Lipid panel  Result Value Ref Range   Cholesterol 204 (H) 0 - 200 mg/dL   Triglycerides 126 <150 mg/dL   HDL 42 >40 mg/dL   Total CHOL/HDL Ratio 4.9 RATIO   VLDL 25 0 - 40 mg/dL   LDL Cholesterol 137 (H) 0 - 99 mg/dL  Comprehensive metabolic panel  Result Value Ref Range   Sodium 131 (L) 135 - 145 mmol/L   Potassium 4.4 3.5 - 5.1 mmol/L   Chloride 98 98 - 111 mmol/L   CO2 24 22 - 32 mmol/L   Glucose, Bld 392 (H) 70 - 99 mg/dL   BUN 10 6 - 20 mg/dL   Creatinine, Ser 0.66 0.61 - 1.24 mg/dL   Calcium 8.5 (L) 8.9 - 10.3 mg/dL   Total Protein 7.8 6.5 - 8.1 g/dL   Albumin 3.2 (L) 3.5 - 5.0 g/dL   AST 18 15 - 41 U/L   ALT 26 0 - 44 U/L   Alkaline Phosphatase 156 (H) 38 - 126 U/L   Total Bilirubin 0.7 0.3 - 1.2 mg/dL   GFR, Estimated >60 >60 mL/min   Anion gap 9 5 - 15           Assessment & Plan:    Encounter Diagnoses  Name Primary?   Essential hypertension Yes   Hyperlipidemia, unspecified hyperlipidemia type    Uncontrolled type 2 diabetes mellitus with hyperglycemia (HCC)    Morbid obesity (HCC)    Eating disorder, unspecified type    Wound of left lower extremity, initial encounter      -reviewed labs with pt -Pt was reminded to return FIT test for colon cancer screening. -Encouraged MH for counseling on eating disorder. -will Increase lipitor.  He is encouraged to eat lowfat diet -Will send metformin XR as written by endocrinology to [facility] -pt was counseled at length on wound care including to avoid alcohol or peroxide.  He is urged to RTO for any signs worsening -pt was encouraged to start exercising regularly.  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  He is to contact office sooner prn

## 2021-04-14 ENCOUNTER — Ambulatory Visit: Payer: Self-pay | Admitting: Orthopaedic Surgery

## 2021-04-15 ENCOUNTER — Other Ambulatory Visit: Payer: Self-pay | Admitting: Orthopaedic Surgery

## 2021-04-15 NOTE — Telephone Encounter (Signed)
Patient requests refill / aware that while Dr Luna Glasgow is out of clinic this week, routing to our practice administrator to review with provider in clinic to review:  oxyCODONE-acetaminophen (PERCOCET) 10-325 MG tablet 170 tablet              [facility], Linna Hoff

## 2021-04-16 ENCOUNTER — Other Ambulatory Visit: Payer: Self-pay | Admitting: Orthopedic Surgery

## 2021-04-16 MED ORDER — OXYCODONE-ACETAMINOPHEN 10-325 MG PO TABS: 1.0000 | ORAL_TABLET | ORAL | 0 refills | Status: DC | PRN

## 2021-04-16 NOTE — Progress Notes (Signed)
Meds ordered this encounter  Medications   oxyCODONE-acetaminophen (PERCOCET) 10-325 MG tablet    Sig: Take 1 tablet by mouth every 4 (four) hours as needed for up to 5 days for pain.    Dispense:  30 tablet    Refill:  0

## 2021-04-21 ENCOUNTER — Ambulatory Visit (INDEPENDENT_AMBULATORY_CARE_PROVIDER_SITE_OTHER): Payer: Self-pay | Admitting: Orthopaedic Surgery

## 2021-04-21 ENCOUNTER — Encounter: Payer: Self-pay | Admitting: Orthopaedic Surgery

## 2021-04-21 ENCOUNTER — Other Ambulatory Visit: Payer: Self-pay

## 2021-04-21 VITALS — BP 161/106 | HR 102 | Ht 67.0 in | Wt >= 6400 oz

## 2021-04-21 DIAGNOSIS — M25562 Pain in left knee: Secondary | ICD-10-CM

## 2021-04-21 DIAGNOSIS — G8929 Other chronic pain: Secondary | ICD-10-CM

## 2021-04-21 DIAGNOSIS — Z6841 Body Mass Index (BMI) 40.0 and over, adult: Secondary | ICD-10-CM

## 2021-04-21 DIAGNOSIS — M25561 Pain in right knee: Secondary | ICD-10-CM

## 2021-04-21 MED ORDER — OXYCODONE-ACETAMINOPHEN 10-325 MG PO TABS: 1.0000 | ORAL_TABLET | ORAL | 0 refills | Status: DC | PRN

## 2021-04-21 NOTE — Progress Notes (Signed)
My knees hurt.  He has no new trauma to the knees.  He has chronic pain, swelling and popping.  He has no redness.  He has gained some more weight.  He is taking his medicine.  ROM of both knees is 0 to 95, crepitus, effusion, limp more to the right.  NV intact.  Chronic edema.  Encounter Diagnoses  Name Primary?   Chronic pain of right knee Yes   Chronic pain of left knee    Morbid obesity due to excess calories (HCC)    Body mass index 60.0-69.9, adult ([place])    I have refilled pain medicine.  I have reviewed the [place] web site prior to prescribing narcotic medicine for this patient.  Return in three months.  Call if any problem.  Precautions discussed.  Electronically Signed Sanjuana Kava, MD 8/16/20221:57 PM

## 2021-05-07 ENCOUNTER — Encounter: Payer: Self-pay | Admitting: Physician Assistant

## 2021-05-07 ENCOUNTER — Ambulatory Visit: Payer: Self-pay | Admitting: Physician Assistant

## 2021-05-07 VITALS — BP 138/80 | HR 101 | Temp 97.6°F | Wt >= 6400 oz

## 2021-05-07 DIAGNOSIS — E1165 Type 2 diabetes mellitus with hyperglycemia: Secondary | ICD-10-CM

## 2021-05-07 DIAGNOSIS — L03116 Cellulitis of left lower limb: Secondary | ICD-10-CM

## 2021-05-07 DIAGNOSIS — F509 Eating disorder, unspecified: Secondary | ICD-10-CM

## 2021-05-07 DIAGNOSIS — R252 Cramp and spasm: Secondary | ICD-10-CM

## 2021-05-07 MED ORDER — CEPHALEXIN 500 MG PO CAPS: 500.0000 mg | ORAL_CAPSULE | Freq: Four times a day (QID) | ORAL | 0 refills | Status: DC

## 2021-05-07 MED ORDER — CYCLOBENZAPRINE HCL 10 MG PO TABS: 10.0000 mg | ORAL_TABLET | Freq: Three times a day (TID) | ORAL | 0 refills | Status: DC | PRN

## 2021-05-07 MED ORDER — CEFTRIAXONE SODIUM 1 G IJ SOLR
1.0000 g | Freq: Once | INTRAMUSCULAR | Status: AC
  Administered 2021-05-07: 1 g via INTRAMUSCULAR

## 2021-05-07 NOTE — Progress Notes (Signed)
BP 138/80   Pulse (!) 101   Temp 97.6 F (36.4 C)   Wt (!) 404 lb (183.3 kg)   SpO2 96%   BMI 63.28 kg/m    Subjective:    Patient ID: [name], male    DOB: Jul 19, 1966, 55 y.o.   MRN: [id number]  HPI: [name] is a 55 y.o. male presenting on 05/07/2021 for Wound Infection (Left leg wound)   HPI  Pt had a negative covid 19 screening questionnaire.      Chief Complaint  Patient presents with   Wound Infection    Left leg wound     Pt is 55yoM with long standing uncontrolled DM and  super morbid obesity.  He says the Problems with his leg started Saturday or Monday.  It is draining.  No fevers.    It hurts.  He is taking pain pills "like crazy".  His sugars are high- 300 this morning.   Pt requested refill on his flexeril stating cramping in his arms and legs.  He thinks that is related to his diabetes medication.    Relevant past medical, surgical, family and social history reviewed and updated as indicated. Interim medical history since our last visit reviewed. Allergies and medications reviewed and updated.   Current Outpatient Medications:    amLODipine (NORVASC) 5 MG tablet, Take 1 tablet (5 mg total) by mouth daily., Disp: 90 tablet, Rfl: 0   atorvastatin (LIPITOR) 40 MG tablet, Take 1 tablet (40 mg total) by mouth daily., Disp: 90 tablet, Rfl: 1   cyclobenzaprine (FLEXERIL) 10 MG tablet, Take 1 tablet (10 mg total) by mouth 3 (three) times daily as needed for muscle spasms., Disp: 30 tablet, Rfl: 0   furosemide (LASIX) 20 MG tablet, Take 1 tablet (20 mg total) by mouth daily., Disp: 30 tablet, Rfl: 1   insulin glargine (LANTUS) 100 UNIT/ML injection, Inject 0.8 mLs (80 Units total) into the skin at bedtime., Disp: 30 mL, Rfl: 3   Insulin Glulisine (APIDRA SOLOSTAR [place]), Inject 16-22 Units into the skin in the morning, at noon, and at bedtime., Disp: , Rfl:    lisinopril (ZESTRIL) 20 MG tablet, Take 1 tablet (20 mg total) by mouth daily., Disp: 90 tablet, Rfl:  0   metFORMIN (GLUCOPHAGE XR) 500 MG 24 hr tablet, Take 2 tablets (1,000 mg total) by mouth daily with breakfast., Disp: 90 tablet, Rfl: 1   metoprolol tartrate (LOPRESSOR) 100 MG tablet, Take 1 tablet (100 mg total) by mouth 2 (two) times daily., Disp: 180 tablet, Rfl: 0   oxyCODONE-acetaminophen (PERCOCET) 10-325 MG tablet, Take 1 tablet by mouth every 4 (four) hours as needed for pain., Disp: 170 tablet, Rfl: 0   sitaGLIPtin (JANUVIA) 100 MG tablet, Take 1 tablet (100 mg total) by mouth daily., Disp: 90 tablet, Rfl: 0    Review of Systems  Per HPI unless specifically indicated above     Objective:    BP 138/80   Pulse (!) 101   Temp 97.6 F (36.4 C)   Wt (!) 404 lb (183.3 kg)   SpO2 96%   BMI 63.28 kg/m   Wt Readings from Last 3 Encounters:  05/07/21 (!) 404 lb (183.3 kg)  04/21/21 (!) 411 lb (186.4 kg)  04/07/21 (!) 393 lb (178.3 kg)    Physical Exam Constitutional:      General: He is not in acute distress.    Appearance: He is obese. He is not toxic-appearing.  HENT:  Head: Normocephalic and atraumatic.  Pulmonary:     Effort: No respiratory distress.  Musculoskeletal:     Left lower leg: Tenderness present.     Comments: LLE with cellulitis and abscess as pictured.  Redness is mild.    Neurological:     Mental Status: He is alert and oriented to person, place, and time.  Psychiatric:        Behavior: Behavior normal.            Assessment & Plan:     Encounter Diagnoses  Name Primary?   Cellulitis of left lower extremity Yes   Morbid obesity ([place])    Uncontrolled type 2 diabetes mellitus with hyperglycemia (HCC)    Muscle cramping    Eating disorder, unspecified type     -will ask care connect to see if they can help pt find Counseling for eating d/o (pt says [facility] doesn't have anything)  -will Refer back to RD for help with DM and obesity  -recommended I&D but pt declines.  Pt was given rocephin 1g IM in office and rx keflex; it is not  likely MRSA.  Pt will be scheduled for follow up when antibiotics are complete.  He is encouraged to contact office sooner for worsening  -pt will be given 1 rx flexeril.  He is told that this will not be refilled.  Discussed that it is unlikely to be caused by his diabetes medications.  Discussed that it is likely to be caused by atrophy and disuse.  Encouraged pt to use his muscles and exercise regularly.  He says he still has the chair exercises information that he was given in the past.  He is encouraged to do this to help with his cramping.

## 2021-05-08 ENCOUNTER — Telehealth: Payer: Self-pay

## 2021-05-08 NOTE — Congregational Nurse Program (Signed)
Client into [name] to pick up glucose strips for his Tru[employer] meter. Client given 2 boxes of strips = 100.  Discussed importance of getting his blood sugars under control. He is ranging in 300s. He see endocrinology for management of DM. He shared that growing up showing love and affection has always been through food and he has continued that. He shares that DM is on both sides of his family and that all his family members have had complications due to uncontrolled DM. He currently has a sister that has DM and is on dialysis.  Client states he would like to break this cycle, but it is difficult.  We discussed that his provider from [facility] reached out regarding assistance with some counseling and other services for mental health. Client admits he could use some assistance. He shares that caring for his son who has mental health issues as well as other special needs is a huge stress that he feels contributes.  We discussed that his son would eat healthy by following his Dad's leading and he knows he needs to make changes now. We discussed ongoing uncontrolled blood sugars can lead to amputations, client currently is being treated for wounds on his leg with cellulitis, we discussed high risk for stroke, heart attack and death.  Options for possible Mental health services: This RN has personally Theatre manager of [employer] [facility] x 2 and left voicemails requesting return calls.  I have spoken to [facility] as a referral just for [facility] services and remaining at [facility] for medical. [facility] would do an initial intake and evaluation to determine if that practice could provide the client what he needs.   Client states he'd prefer not [facility] as he has had a bad experience there. He is willing to try [facility]. We discussed that there would be a co pay and his first session would be an evaluation to determine his needs and if they could provide services  to assist with those needs. He wants to pursue this. I have sent his Care connect enrollment packet via secure e-mail to Mammie Russian office staff of [facility] and reason for referral.  Discussed with client that [facility] would reach out to him regarding an appointment and amount of copayment. He expressed understanding.   Discussed with client that [facility] now has a food market with fresh vegetables and fruits and that he may use our market once per month. Client states he would prefer I call him when I'm back in the office for him to come visit. Client provided with strips and log sheets along with more educational materials from [facility].  We also discussed the [employer] program that is proposed to start for client to utilize the pool for exercise due to joint issues to help lower blood sugars and A1Cs. Client however will need to have wound healing on his leg and free of infection prior to using the pool. He states understanding.  Plan: will plan to follow up with client after return to office.  [place] [name]/[facility]

## 2021-05-08 NOTE — Telephone Encounter (Signed)
Client called requesting more strips to monitor his blood glucose. Client is now with leg wounds and cellulitis. Client followed by [facility] as well as Endocrine. Blood sugars are still staying in the 300s.  Provider at [facility] had asked that we help assist client with options for some MH/BH services.  Will discuss options today when client is here to get strips.  Plans: had planned on client utilizing the new program that is developing from [place] connect to partner with the [facility] and get clients scholarships paid to get them into low impact pool exercise. Proposed start is October. Client would need to be free of wounds to participate. Will follow.  [place] [employer]

## 2021-05-18 ENCOUNTER — Other Ambulatory Visit: Payer: Self-pay | Admitting: Physician Assistant

## 2021-05-18 ENCOUNTER — Telehealth: Payer: Self-pay | Admitting: Physician Assistant

## 2021-05-18 MED ORDER — NIRMATRELVIR/RITONAVIR (PAXLOVID)TABLET: 3.0000 | ORAL_TABLET | Freq: Two times a day (BID) | ORAL | 0 refills | Status: AC

## 2021-05-18 NOTE — Telephone Encounter (Signed)
Patient called in stating he took a Covid test yesterday and it was positive. He wanted to know where to get another free test. Patient was advised that if he tested positive, he is positive and doe not need to take another test. Patient was advised that he will need to stay home and quentin for 10 days and if symptoms continue after call our office. Provider called in [place] to [place] on [address] in [place]. He was told to pick up at drive thru to avoid contact. Patient stated he understood he would need to stop his atorvastatin and oxycodone during the time he is taking the medication and 3 days after he finishes the medicine. He would also need to cut his Amlodipine in half for the same duration. He was also told to monitor his blood sugars closely and to notify us of any issues.

## 2021-05-19 ENCOUNTER — Ambulatory Visit: Payer: Self-pay | Admitting: Physician Assistant

## 2021-05-19 ENCOUNTER — Telehealth: Payer: Self-pay | Admitting: Orthopaedic Surgery

## 2021-05-19 ENCOUNTER — Telehealth: Payer: Self-pay

## 2021-05-19 MED ORDER — OXYCODONE-ACETAMINOPHEN 10-325 MG PO TABS: 1.0000 | ORAL_TABLET | ORAL | 0 refills | Status: DC | PRN

## 2021-05-19 NOTE — Telephone Encounter (Signed)
Patient requests refill on Oxycodone/Acetaminophen 10-325 mgs.  Qty  170  Sig: Take 1 tablet by mouth every 4 (four) hours as needed for pain.  [facility] [place]

## 2021-05-19 NOTE — Telephone Encounter (Signed)
Returned missed call from client. He shares that he has tested positive for Covid and is currently taking paxlovid prescribed by his primary care provider at [facility] x 5 days. Reinforced recommendations of provider's office regarding isolation and medication changes during course of medication paxlovid.   Stressed importance of monitoring blood sugars during time of illness. Client sounds congested nasally over the phone. Recommended hydration, rest, proper nutrition. Stressed checking blood sugars before taking insulins and follow guidelines of [facility]ocrinology/[facility]. Client reports he "doesn't feel too bad". Client was inquiring where to get free at home covid test. Reinforced that a positive at home is a positive test and that he could still test positive after his 10 days of isolation. He states understanding and states [facility] told him the same. For future needs did recommend client could check with the [facility] as they at times have at home covid tests for the community, but stressed to call first. Stressed masking when around his son, he states his son is with his sister while he is isolating and sick. Reinforced when client is over isolation to still wear a mask when in public areas and good hand hygiene, especially with his health conditions. Client reports understanding.  Inquired how client feels his leg wound is doing, as he was prescribed antibiotics. "I think it is doing much better and is healing up" He reports his blood sugars are coming down from the 300s which we discussed would help promote healing.   *had placed a referral to [facility] for [place] services only. Client reports he was contacted , but they will wait to schedule him to come in for evaluation after he has isolated with Covid.   Will continue to follow  [place] Gunn/[facility]

## 2021-06-01 ENCOUNTER — Ambulatory Visit: Payer: Self-pay | Admitting: Nurse Practitioner

## 2021-06-03 ENCOUNTER — Telehealth: Payer: Self-pay

## 2021-06-03 NOTE — Telephone Encounter (Signed)
Called to follow up with client after recent covid pos test. No answer left voicemail.   [place] [employer]

## 2021-06-16 ENCOUNTER — Telehealth: Payer: Self-pay | Admitting: Orthopaedic Surgery

## 2021-06-16 MED ORDER — OXYCODONE-ACETAMINOPHEN 10-325 MG PO TABS: 1.0000 | ORAL_TABLET | ORAL | 0 refills | Status: DC | PRN

## 2021-06-16 NOTE — Telephone Encounter (Signed)
Patient called for refill: oxyCODONE-acetaminophen (PERCOCET) 10-325 MG tablet 170 tablet      [facility] [place]

## 2021-06-23 ENCOUNTER — Other Ambulatory Visit: Payer: Self-pay | Admitting: Physician Assistant

## 2021-06-23 DIAGNOSIS — E785 Hyperlipidemia, unspecified: Secondary | ICD-10-CM

## 2021-06-23 DIAGNOSIS — I10 Essential (primary) hypertension: Secondary | ICD-10-CM

## 2021-06-23 DIAGNOSIS — E1165 Type 2 diabetes mellitus with hyperglycemia: Secondary | ICD-10-CM

## 2021-06-23 DIAGNOSIS — Z125 Encounter for screening for malignant neoplasm of prostate: Secondary | ICD-10-CM

## 2021-07-07 ENCOUNTER — Other Ambulatory Visit (HOSPITAL_COMMUNITY)
Admission: RE | Admit: 2021-07-07 | Discharge: 2021-07-07 | Disposition: A | Discharge status: Home / Self Care | Payer: Self-pay | Source: Ambulatory Visit | Attending: Physician Assistant | Admitting: Physician Assistant

## 2021-07-07 ENCOUNTER — Telehealth: Payer: Self-pay

## 2021-07-07 ENCOUNTER — Telehealth: Payer: Self-pay | Admitting: Licensed Clinical Social Worker

## 2021-07-07 DIAGNOSIS — E785 Hyperlipidemia, unspecified: Secondary | ICD-10-CM | POA: Insufficient documentation

## 2021-07-07 DIAGNOSIS — E1165 Type 2 diabetes mellitus with hyperglycemia: Secondary | ICD-10-CM | POA: Insufficient documentation

## 2021-07-07 DIAGNOSIS — Z125 Encounter for screening for malignant neoplasm of prostate: Secondary | ICD-10-CM | POA: Insufficient documentation

## 2021-07-07 DIAGNOSIS — I10 Essential (primary) hypertension: Secondary | ICD-10-CM | POA: Insufficient documentation

## 2021-07-07 LAB — COMPREHENSIVE METABOLIC PANEL
ALT: 37 U/L (ref 0–44)
AST: 27 U/L (ref 15–41)
Albumin: 3.4 g/dL — ABNORMAL LOW (ref 3.5–5.0)
Alkaline Phosphatase: 218 U/L — ABNORMAL HIGH (ref 38–126)
Anion gap: 11 (ref 5–15)
BUN: 11 mg/dL (ref 6–20)
CO2: 22 mmol/L (ref 22–32)
Calcium: 9.4 mg/dL (ref 8.9–10.3)
Chloride: 96 mmol/L — ABNORMAL LOW (ref 98–111)
Creatinine, Ser: 0.76 mg/dL (ref 0.61–1.24)
GFR, Estimated: >60 mL/min (ref >60)
Glucose, Bld: 509 mg/dL (ref 70–99)
Potassium: 4.3 mmol/L (ref 3.5–5.1)
Sodium: 129 mmol/L — ABNORMAL LOW (ref 135–145)
Total Bilirubin: 0.9 mg/dL (ref 0.3–1.2)
Total Protein: 8.1 g/dL (ref 6.5–8.1)

## 2021-07-07 LAB — LIPID PANEL
Cholesterol: 247 mg/dL — ABNORMAL HIGH (ref 0–200)
HDL: 44 mg/dL (ref >40)
LDL Cholesterol: 165 mg/dL — ABNORMAL HIGH (ref 0–99)
Total CHOL/HDL Ratio: 5.6 RATIO
Triglycerides: 191 mg/dL — ABNORMAL HIGH (ref <150)
VLDL: 38 mg/dL (ref 0–40)

## 2021-07-07 LAB — PSA: Prostatic Specific Antigen: 0.26 ng/mL (ref 0.00–4.00)

## 2021-07-07 NOTE — Telephone Encounter (Signed)
Called to follow up with client and discuss the beginning of the [facility] program as early as next week.  Client reports he's not feeling well and does not feel like he can do that program at this time. Client states he really needs [facility] to get to a urologist ASAP. Per client last doc needed is food stamp award letter. He cannot find. Suggested he call DSS to request one and let case worker know why he needs it, maybe they would let him come pick it up.  Reminder client has not had labs drawn for [facility] appt which includes a PSA. Client reports he is going today.  Plan follow up with client tomorrow at appt and will bring testing strips to him and more log sheets.   Client vague about blood sugar results stating "up and down" client recently treated for a leg wound by [facility], client reports he's not sure it's healing up. On 05/19/21 conversation he stated it was doing much better". Recommended then for client to contact [facility] if he felt it was not improving with the antibiotics or getting worse. He stated understanding. Client with Covid positive 05/18/21  Prior to Covid had discussed [facility] for [place] services and documents sent to [facility] to establish first with counselor. Client reports he had to cancel that appt due to covid. Will reschedule. Per E-clinical EMR client had appointment 06/03/21 that he did not make, no further appointments scheduled.   [place] [employer]

## 2021-07-07 NOTE — Telephone Encounter (Signed)
[facility] reached patient via phone call to offer scheduling for counseling services, patient informed [facility] that he was busy and could not talk at time of call, [facility] informed patient that he would call again at a future date.

## 2021-07-08 ENCOUNTER — Ambulatory Visit: Payer: Self-pay | Admitting: Physician Assistant

## 2021-07-08 ENCOUNTER — Telehealth: Payer: Self-pay | Admitting: Licensed Clinical Social Worker

## 2021-07-08 ENCOUNTER — Encounter: Payer: Self-pay | Admitting: Physician Assistant

## 2021-07-08 VITALS — BP 130/80 | HR 115 | Temp 97.1°F | Wt 384.0 lb

## 2021-07-08 DIAGNOSIS — L03116 Cellulitis of left lower limb: Secondary | ICD-10-CM

## 2021-07-08 DIAGNOSIS — R7989 Other specified abnormal findings of blood chemistry: Secondary | ICD-10-CM

## 2021-07-08 DIAGNOSIS — E785 Hyperlipidemia, unspecified: Secondary | ICD-10-CM

## 2021-07-08 DIAGNOSIS — E1165 Type 2 diabetes mellitus with hyperglycemia: Secondary | ICD-10-CM

## 2021-07-08 DIAGNOSIS — S81802D Unspecified open wound, left lower leg, subsequent encounter: Secondary | ICD-10-CM

## 2021-07-08 DIAGNOSIS — I10 Essential (primary) hypertension: Secondary | ICD-10-CM

## 2021-07-08 DIAGNOSIS — F509 Eating disorder, unspecified: Secondary | ICD-10-CM

## 2021-07-08 LAB — MICROALBUMIN, URINE: Microalb, Ur: <3 ug/mL — ABNORMAL HIGH

## 2021-07-08 LAB — HEMOGLOBIN A1C
Hgb A1c MFr Bld: >15.5 % — ABNORMAL HIGH (ref 4.8–5.6)
Mean Plasma Glucose: >398 mg/dL

## 2021-07-08 MED ORDER — CEPHALEXIN 500 MG PO CAPS: 500.0000 mg | ORAL_CAPSULE | Freq: Four times a day (QID) | ORAL | 0 refills | Status: AC

## 2021-07-08 NOTE — Telephone Encounter (Signed)
[facility] left voicemail for patient regarding rescheduling the follow-up appointment that was originally scheduled for 11/15 at 1 pm, but will interfere with another appointment of patient's.

## 2021-07-08 NOTE — Patient Instructions (Signed)
CONTACT ENDOCRINOLOGY for follow up

## 2021-07-08 NOTE — Telephone Encounter (Signed)
Warm Hand-off Note: [facility] met with patient today for 15 minutes. [facility] provided active listening and empathetic presence as patient shared about depression symptoms related to his current health issues. Patient stated that he has "trouble dealing with cravings." Patient agreed to [facility] follow-ups with [facility], first follow-up will be scheduled via phone call with patient.

## 2021-07-08 NOTE — Progress Notes (Signed)
BP 130/80   Pulse (!) 115   Temp (!) 97.1 F (36.2 C)   Wt (!) 384 lb (174.2 kg)   SpO2 98%   BMI 60.14 kg/m    Subjective:    Patient ID: [name], male    DOB: 03/15/1966, 55 y.o.   MRN: [id number]  HPI: [name] is a 55 y.o. male presenting on 07/08/2021 for Follow-up   HPI  Pt is 34yoM who is in for routine follow up.   He has longstanding uncontrolled diabetes which is monitored by endocrinology.  He has not been to endocrinologist since June.    He Feels weak since he had covid in [place].     He has Lots of stress   He says the Sores haven't healed on leg; they have been there for months.        Relevant past medical, surgical, family and social history reviewed and updated as indicated. Interim medical history since our last visit reviewed. Allergies and medications reviewed and updated.   Current Outpatient Medications:    amLODipine (NORVASC) 5 MG tablet, Take 1 tablet (5 mg total) by mouth daily., Disp: 90 tablet, Rfl: 0   atorvastatin (LIPITOR) 40 MG tablet, Take 1 tablet (40 mg total) by mouth daily., Disp: 90 tablet, Rfl: 1   furosemide (LASIX) 20 MG tablet, Take 1 tablet (20 mg total) by mouth daily., Disp: 30 tablet, Rfl: 1   insulin glargine (LANTUS) 100 UNIT/ML injection, Inject 0.8 mLs (80 Units total) into the skin at bedtime., Disp: 30 mL, Rfl: 3   Insulin Glulisine (APIDRA SOLOSTAR ), Inject 16-22 Units into the skin in the morning, at noon, and at bedtime., Disp: , Rfl:    lisinopril (ZESTRIL) 20 MG tablet, Take 1 tablet (20 mg total) by mouth daily., Disp: 90 tablet, Rfl: 0   metFORMIN (GLUCOPHAGE XR) 500 MG 24 hr tablet, Take 2 tablets (1,000 mg total) by mouth daily with breakfast., Disp: 90 tablet, Rfl: 1   metoprolol tartrate (LOPRESSOR) 100 MG tablet, Take 1 tablet (100 mg total) by mouth 2 (two) times daily., Disp: 180 tablet, Rfl: 0   oxyCODONE-acetaminophen (PERCOCET) 10-325 MG tablet, Take 1 tablet by mouth every 4 (four) hours  as needed for pain., Disp: 170 tablet, Rfl: 0   sitaGLIPtin (JANUVIA) 100 MG tablet, Take 1 tablet (100 mg total) by mouth daily., Disp: 90 tablet, Rfl: 0   cyclobenzaprine (FLEXERIL) 10 MG tablet, Take 1 tablet (10 mg total) by mouth 3 (three) times daily as needed for muscle spasms. (Patient not taking: Reported on 07/08/2021), Disp: 30 tablet, Rfl: 0    Review of Systems  Per HPI unless specifically indicated above     Objective:    BP 130/80   Pulse (!) 115   Temp (!) 97.1 F (36.2 C)   Wt (!) 384 lb (174.2 kg)   SpO2 98%   BMI 60.14 kg/m   Wt Readings from Last 3 Encounters:  07/08/21 (!) 384 lb (174.2 kg)  05/07/21 (!) 404 lb (183.3 kg)  04/21/21 (!) 411 lb (186.4 kg)    Physical Exam Constitutional:      General: He is not in acute distress.    Appearance: He is obese. He is not toxic-appearing.  HENT:     Head: Normocephalic and atraumatic.  Cardiovascular:     Rate and Rhythm: Normal rate and regular rhythm.     Comments: Pulse was up when he first got to exam room  but slowed down to 90s after he sat and rested.   Pulmonary:     Effort: No respiratory distress.     Breath sounds: No wheezing or rhonchi.  Abdominal:     Palpations: Abdomen is soft.     Tenderness: There is no abdominal tenderness.  Neurological:     Mental Status: He is alert and oriented to person, place, and time.  Psychiatric:        Attention and Perception: Attention normal.        Speech: Speech normal.        Behavior: Behavior is cooperative.         Results for orders placed or performed during the hospital encounter of 07/07/21  Lipid panel  Result Value Ref Range   Cholesterol 247 (H) 0 - 200 mg/dL   Triglycerides 191 (H) <150 mg/dL   HDL 44 >40 mg/dL   Total CHOL/HDL Ratio 5.6 RATIO   VLDL 38 0 - 40 mg/dL   LDL Cholesterol 165 (H) 0 - 99 mg/dL  Microalbumin, urine  Result Value Ref Range   Microalb, Ur <3.0 (H) Not Estab. ug/mL  Hemoglobin A1c  Result Value Ref  Range   Hgb A1c MFr Bld >15.5 (H) 4.8 - 5.6 %   Mean Plasma Glucose >398 mg/dL  PSA  Result Value Ref Range   Prostatic Specific Antigen 0.26 0.00 - 4.00 ng/mL  Comprehensive metabolic panel  Result Value Ref Range   Sodium 129 (L) 135 - 145 mmol/L   Potassium 4.3 3.5 - 5.1 mmol/L   Chloride 96 (L) 98 - 111 mmol/L   CO2 22 22 - 32 mmol/L   Glucose, Bld 509 (HH) 70 - 99 mg/dL   BUN 11 6 - 20 mg/dL   Creatinine, Ser 0.76 0.61 - 1.24 mg/dL   Calcium 9.4 8.9 - 10.3 mg/dL   Total Protein 8.1 6.5 - 8.1 g/dL   Albumin 3.4 (L) 3.5 - 5.0 g/dL   AST 27 15 - 41 U/L   ALT 37 0 - 44 U/L   Alkaline Phosphatase 218 (H) 38 - 126 U/L   Total Bilirubin 0.9 0.3 - 1.2 mg/dL   GFR, Estimated >60 >60 mL/min   Anion gap 11 5 - 15      Assessment & Plan:    Encounter Diagnoses  Name Primary?   Uncontrolled type 2 diabetes mellitus with hyperglycemia ([place]) Yes   Essential hypertension    Hyperlipidemia, unspecified hyperlipidemia type    Morbid obesity ([place])    Wound of left lower extremity, subsequent encounter    Cellulitis of left lower extremity    Eating disorder, unspecified type    Elevated LFTs         -reviewed labs with pt -pt has his meds and insulin.  Discussed with pt that he needs to manage his intake better to get control of his DM and dyslipidemia.  He is in agreement -pt is urged to contact endocrinology to reschedule.   he is encouraged to monitor his blood sugars and take log with him to his appointment -pt spoke with [facility] counselor today and scheduled counseling session for later this month -rx keflex for infected wound LLE -Alk Phos up quite a bit today -pt to RTO for recheck 3-4 weeks for recheck LLE and LFTs.  He is to contact office sooner prn worsening

## 2021-07-14 ENCOUNTER — Telehealth: Payer: Self-pay | Admitting: Orthopaedic Surgery

## 2021-07-14 MED ORDER — OXYCODONE-ACETAMINOPHEN 10-325 MG PO TABS: 1.0000 | ORAL_TABLET | ORAL | 0 refills | Status: DC | PRN

## 2021-07-14 NOTE — Telephone Encounter (Signed)
Patient requests refill: oxyCODONE-acetaminophen (PERCOCET) 10-325 MG tablet 170 tablet      [facility] [address]

## 2021-07-15 ENCOUNTER — Telehealth: Payer: Self-pay | Admitting: Licensed Clinical Social Worker

## 2021-07-15 NOTE — Telephone Encounter (Signed)
[facility] left second voicemail for patient regarding rescheduling 11/15 appointment.

## 2021-07-15 NOTE — Telephone Encounter (Signed)
Patient called back [facility] and next [facility] appointment was rescheduled for 11/16 at 3 pm.

## 2021-07-21 ENCOUNTER — Ambulatory Visit: Payer: Self-pay | Admitting: Orthopaedic Surgery

## 2021-07-22 ENCOUNTER — Ambulatory Visit: Payer: Self-pay | Admitting: Licensed Clinical Social Worker

## 2021-07-22 ENCOUNTER — Telehealth: Payer: Self-pay | Admitting: Licensed Clinical Social Worker

## 2021-07-22 NOTE — Telephone Encounter (Signed)
[facility] received call from patient cancelling 3 pm appointment on current date. Appointment was rescheduled for 3 pm on 11/30.

## 2021-08-04 ENCOUNTER — Encounter: Payer: Self-pay | Admitting: Orthopaedic Surgery

## 2021-08-04 ENCOUNTER — Other Ambulatory Visit: Payer: Self-pay

## 2021-08-04 ENCOUNTER — Ambulatory Visit: Payer: Self-pay | Admitting: Physician Assistant

## 2021-08-04 ENCOUNTER — Ambulatory Visit (INDEPENDENT_AMBULATORY_CARE_PROVIDER_SITE_OTHER): Payer: Self-pay | Admitting: Orthopaedic Surgery

## 2021-08-04 VITALS — BP 166/104 | HR 108 | Ht 67.0 in | Wt 385.0 lb

## 2021-08-04 DIAGNOSIS — Z6841 Body Mass Index (BMI) 40.0 and over, adult: Secondary | ICD-10-CM

## 2021-08-04 DIAGNOSIS — G8929 Other chronic pain: Secondary | ICD-10-CM

## 2021-08-04 DIAGNOSIS — M25561 Pain in right knee: Secondary | ICD-10-CM

## 2021-08-04 DIAGNOSIS — M25562 Pain in left knee: Secondary | ICD-10-CM

## 2021-08-04 DIAGNOSIS — M1A09X0 Idiopathic chronic gout, multiple sites, without tophus (tophi): Secondary | ICD-10-CM

## 2021-08-04 MED ORDER — ALLOPURINOL 300 MG PO TABS: 300.0000 mg | ORAL_TABLET | Freq: Every day | ORAL | 5 refills | Status: AC

## 2021-08-04 NOTE — Progress Notes (Signed)
My knees hurt more  He has stopped his allopurinol.  I will resume it. He has no new trauma.  He has no giving way of the knees.  His diabetes is under control now.  Knees are tender with crepitus and effusions bilaterally, left ROM 0 to 105, right 0 to 110, slight limp left.  NV intact.  Knees stable.  Encounter Diagnoses  Name Primary?   Chronic pain of right knee Yes   Chronic pain of left knee    Morbid obesity due to excess calories (HCC)    Body mass index 60.0-69.9, adult (HCC)    Idiopathic chronic gout of multiple sites without tophus    I have refilled allopurinol.  Return in three months.  Call if any problem.  Precautions discussed.  Electronically Signed Sanjuana Kava, MD 11/29/20222:37 PM

## 2021-08-05 ENCOUNTER — Ambulatory Visit: Payer: Self-pay | Admitting: Physician Assistant

## 2021-08-05 ENCOUNTER — Telehealth: Payer: Self-pay

## 2021-08-05 ENCOUNTER — Ambulatory Visit: Payer: Self-pay | Admitting: Licensed Clinical Social Worker

## 2021-08-05 DIAGNOSIS — F419 Anxiety disorder, unspecified: Secondary | ICD-10-CM

## 2021-08-05 DIAGNOSIS — E1165 Type 2 diabetes mellitus with hyperglycemia: Secondary | ICD-10-CM

## 2021-08-05 DIAGNOSIS — S81802D Unspecified open wound, left lower leg, subsequent encounter: Secondary | ICD-10-CM

## 2021-08-05 NOTE — Telephone Encounter (Signed)
Called client to follow up prior to his appointment today [facility] to ask if he needing any glucose testing supplies. He states that he does not need any.  He did ask about his CAFA application. Confirmed with [place] navigator with [place] that it has been submitted and is awaiting review and determination. Client made aware of this via text message per his request.   [name] RN [facility].

## 2021-08-05 NOTE — Progress Notes (Signed)
There were no vitals taken for this visit.   Subjective:    Patient ID: [name], male    DOB: 03-Jul-1966, 55 y.o.   MRN: [id number]  HPI: [name] is a 55 y.o. male presenting on 08/05/2021 for No chief complaint on file.   HPI   Pt cancelled his follow up appointment on 11/29 because he says he was having problems with his son.   Pt was seen 07/08/21 and was prescribed keflex for infected wound on his LLE.  Pt finished keflex.   He has been spraying the wound with some type of antibiotic first adi spray.   He has not been following up with his endocrinologist for his diabetes.  He was encouraged at his 11/2 appt to reschedule with endocrinologist but he has not yet done this.    Pt reports a lot of stress, largely due to his son's situation.  Pt has appointment with [facility] today scheduled for 15 minutes after his appointment with PCP.     Relevant past medical, surgical, family and social history reviewed and updated as indicated. Interim medical history since our last visit reviewed. Allergies and medications reviewed and updated.   Current Outpatient Medications:    allopurinol (ZYLOPRIM) 300 MG tablet, Take 1 tablet (300 mg total) by mouth daily., Disp: 30 tablet, Rfl: 5   amLODipine (NORVASC) 5 MG tablet, Take 1 tablet (5 mg total) by mouth daily., Disp: 90 tablet, Rfl: 0   atorvastatin (LIPITOR) 40 MG tablet, Take 1 tablet (40 mg total) by mouth daily., Disp: 90 tablet, Rfl: 1   cyclobenzaprine (FLEXERIL) 10 MG tablet, Take 1 tablet (10 mg total) by mouth 3 (three) times daily as needed for muscle spasms. (Patient not taking: Reported on 07/08/2021), Disp: 30 tablet, Rfl: 0   furosemide (LASIX) 20 MG tablet, Take 1 tablet (20 mg total) by mouth daily., Disp: 30 tablet, Rfl: 1   insulin glargine (LANTUS) 100 UNIT/ML injection, Inject 0.8 mLs (80 Units total) into the skin at bedtime., Disp: 30 mL, Rfl: 3   Insulin Glulisine (APIDRA SOLOSTAR [place]), Inject 16-22 Units into the  skin in the morning, at noon, and at bedtime., Disp: , Rfl:    lisinopril (ZESTRIL) 20 MG tablet, Take 1 tablet (20 mg total) by mouth daily., Disp: 90 tablet, Rfl: 0   metFORMIN (GLUCOPHAGE XR) 500 MG 24 hr tablet, Take 2 tablets (1,000 mg total) by mouth daily with breakfast., Disp: 90 tablet, Rfl: 1   metoprolol tartrate (LOPRESSOR) 100 MG tablet, Take 1 tablet (100 mg total) by mouth 2 (two) times daily., Disp: 180 tablet, Rfl: 0   oxyCODONE-acetaminophen (PERCOCET) 10-325 MG tablet, Take 1 tablet by mouth every 4 (four) hours as needed for pain., Disp: 170 tablet, Rfl: 0   sitaGLIPtin (JANUVIA) 100 MG tablet, Take 1 tablet (100 mg total) by mouth daily., Disp: 90 tablet, Rfl: 0    Review of Systems  Per HPI unless specifically indicated above     Objective:    There were no vitals taken for this visit.  Wt Readings from Last 3 Encounters:  08/04/21 (!) 385 lb (174.6 kg)  07/08/21 (!) 384 lb (174.2 kg)  05/07/21 (!) 404 lb (183.3 kg)    Physical Exam Constitutional:      Appearance: He is obese. He is not toxic-appearing.  HENT:     Head: Normocephalic and atraumatic.  Pulmonary:     Effort: No respiratory distress.  Musculoskeletal:  Comments: Chronic stasis changes BLE.  Wound LLE as pictured.   Small oozing when pressure applied. No abscess  Neurological:     Mental Status: He is alert and oriented to person, place, and time.  Psychiatric:        Attention and Perception: Attention normal.        Behavior: Behavior is cooperative.              Assessment & Plan:   Encounter Diagnoses  Name Primary?   Wound of left lower extremity, subsequent encounter Yes   Uncontrolled type 2 diabetes mellitus with hyperglycemia ([place])    Morbid obesity (HCC)      -Pt to discontinue spray.  He is counseled to wash it daily with antibacterial soap and apply dry sterile non-stick dressing.   Dressing was placed while pt was in office. -He is encouraged to reschedule  with endocrinologist.  Disussed with pt that his wound will have difficulty healing with his blood sugars so out of control -F/u 10 days- bring bs log.  He is to RTO sooner for any worsening or other problems

## 2021-08-05 NOTE — Progress Notes (Signed)
[facility] engaged patient in first [facility] follow-up. [facility] provided reflective listening and validation as patient shared about anxiety and cravings related to eating issues. [facility] and patient explored patient's childhood and relationship with food. [facility] led patient in mindfulness exercise.

## 2021-08-06 ENCOUNTER — Other Ambulatory Visit: Payer: Self-pay | Admitting: Radiology

## 2021-08-06 NOTE — Telephone Encounter (Signed)
One week too early

## 2021-08-07 ENCOUNTER — Encounter: Payer: Self-pay | Admitting: Physician Assistant

## 2021-08-12 ENCOUNTER — Telehealth: Payer: Self-pay | Admitting: Orthopaedic Surgery

## 2021-08-12 MED ORDER — OXYCODONE-ACETAMINOPHEN 10-325 MG PO TABS: 1.0000 | ORAL_TABLET | ORAL | 0 refills | Status: DC | PRN

## 2021-08-12 NOTE — Telephone Encounter (Signed)
Patient called for refill (aware due at this time): oxyCODONE-acetaminophen (PERCOCET) 10-325 MG tablet 170 tablet      [facility] [address]

## 2021-08-18 ENCOUNTER — Ambulatory Visit: Payer: Self-pay | Admitting: Physician Assistant

## 2021-09-03 ENCOUNTER — Telehealth: Payer: Self-pay

## 2021-09-03 NOTE — Telephone Encounter (Signed)
Attempted to contact client for follow up as enrolled with [facility]. No answer, left message requesting return call.   [employer]

## 2021-09-03 NOTE — Telephone Encounter (Signed)
Missed return call from client. Attempted to call back no answer, left Voicemail requesting return call.  [name]

## 2021-09-09 ENCOUNTER — Other Ambulatory Visit: Payer: Self-pay

## 2021-09-09 ENCOUNTER — Ambulatory Visit: Payer: Self-pay | Admitting: Licensed Clinical Social Worker

## 2021-09-09 DIAGNOSIS — F419 Anxiety disorder, unspecified: Secondary | ICD-10-CM

## 2021-09-09 NOTE — Progress Notes (Signed)
[facility] engaged patient in session. [name] led patient in EFT tapping exercise. [facility] provided active listening as patient shared about stressors and cravings. [facility] used narrative externalization exercise to help patient work with his cravings and unhelpful thoughts.

## 2021-09-10 ENCOUNTER — Other Ambulatory Visit: Payer: Self-pay | Admitting: Physician Assistant

## 2021-09-10 ENCOUNTER — Telehealth: Payer: Self-pay

## 2021-09-10 DIAGNOSIS — R7989 Other specified abnormal findings of blood chemistry: Secondary | ICD-10-CM

## 2021-09-10 DIAGNOSIS — I10 Essential (primary) hypertension: Secondary | ICD-10-CM

## 2021-09-10 DIAGNOSIS — E1165 Type 2 diabetes mellitus with hyperglycemia: Secondary | ICD-10-CM

## 2021-09-10 DIAGNOSIS — E785 Hyperlipidemia, unspecified: Secondary | ICD-10-CM

## 2021-09-10 MED ORDER — OXYCODONE-ACETAMINOPHEN 10-325 MG PO TABS: 1.0000 | ORAL_TABLET | ORAL | 0 refills | Status: DC | PRN

## 2021-09-10 NOTE — Telephone Encounter (Signed)
Oxycodone-Acetaminophen 10/325 mg  Qty 170 tablets  PATIENT USES [facility]

## 2021-09-21 ENCOUNTER — Ambulatory Visit: Payer: Self-pay | Admitting: Physician Assistant

## 2021-09-22 ENCOUNTER — Ambulatory Visit: Payer: Self-pay | Admitting: Physician Assistant

## 2021-09-28 ENCOUNTER — Encounter: Payer: Self-pay | Admitting: Physician Assistant

## 2021-09-28 ENCOUNTER — Other Ambulatory Visit: Payer: Self-pay

## 2021-09-28 ENCOUNTER — Ambulatory Visit: Payer: Self-pay | Admitting: Physician Assistant

## 2021-09-28 ENCOUNTER — Other Ambulatory Visit (HOSPITAL_COMMUNITY)
Admission: RE | Admit: 2021-09-28 | Discharge: 2021-09-28 | Disposition: A | Discharge status: Home / Self Care | Payer: Self-pay | Source: Ambulatory Visit | Attending: Physician Assistant | Admitting: Physician Assistant

## 2021-09-28 VITALS — BP 135/89 | HR 100 | Temp 97.2°F | Wt 367.0 lb

## 2021-09-28 DIAGNOSIS — R7989 Other specified abnormal findings of blood chemistry: Secondary | ICD-10-CM | POA: Insufficient documentation

## 2021-09-28 DIAGNOSIS — I10 Essential (primary) hypertension: Secondary | ICD-10-CM | POA: Insufficient documentation

## 2021-09-28 DIAGNOSIS — E785 Hyperlipidemia, unspecified: Secondary | ICD-10-CM

## 2021-09-28 DIAGNOSIS — F509 Eating disorder, unspecified: Secondary | ICD-10-CM

## 2021-09-28 DIAGNOSIS — R748 Abnormal levels of other serum enzymes: Secondary | ICD-10-CM

## 2021-09-28 DIAGNOSIS — S81801A Unspecified open wound, right lower leg, initial encounter: Secondary | ICD-10-CM

## 2021-09-28 DIAGNOSIS — E1165 Type 2 diabetes mellitus with hyperglycemia: Secondary | ICD-10-CM

## 2021-09-28 DIAGNOSIS — F419 Anxiety disorder, unspecified: Secondary | ICD-10-CM

## 2021-09-28 DIAGNOSIS — S81802D Unspecified open wound, left lower leg, subsequent encounter: Secondary | ICD-10-CM

## 2021-09-28 DIAGNOSIS — K750 Abscess of liver: Secondary | ICD-10-CM

## 2021-09-28 LAB — POCT URINALYSIS DIPSTICK
Bilirubin, UA: NEGATIVE
Blood, UA: NEGATIVE
Glucose, UA: POSITIVE — AB
Ketones, UA: NEGATIVE
Nitrite, UA: NEGATIVE
Protein, UA: NEGATIVE
Spec Grav, UA: <=1.005 — AB (ref 1.010–1.025)
Urobilinogen, UA: 0.2 E.U./dL
pH, UA: 6 (ref 5.0–8.0)

## 2021-09-28 LAB — COMPREHENSIVE METABOLIC PANEL
ALT: 33 U/L (ref 0–44)
AST: 26 U/L (ref 15–41)
Albumin: 3.5 g/dL (ref 3.5–5.0)
Alkaline Phosphatase: 291 U/L — ABNORMAL HIGH (ref 38–126)
Anion gap: 9 (ref 5–15)
BUN: 10 mg/dL (ref 6–20)
CO2: 25 mmol/L (ref 22–32)
Calcium: 9 mg/dL (ref 8.9–10.3)
Chloride: 93 mmol/L — ABNORMAL LOW (ref 98–111)
Creatinine, Ser: 0.73 mg/dL (ref 0.61–1.24)
GFR, Estimated: >60 mL/min (ref >60)
Glucose, Bld: 539 mg/dL (ref 70–99)
Potassium: 4.8 mmol/L (ref 3.5–5.1)
Sodium: 127 mmol/L — ABNORMAL LOW (ref 135–145)
Total Bilirubin: 0.7 mg/dL (ref 0.3–1.2)
Total Protein: 8.1 g/dL (ref 6.5–8.1)

## 2021-09-28 LAB — LIPID PANEL
Cholesterol: 264 mg/dL — ABNORMAL HIGH (ref 0–200)
HDL: 46 mg/dL (ref >40)
LDL Cholesterol: 166 mg/dL — ABNORMAL HIGH (ref 0–99)
Total CHOL/HDL Ratio: 5.7 RATIO
Triglycerides: 261 mg/dL — ABNORMAL HIGH (ref <150)
VLDL: 52 mg/dL — ABNORMAL HIGH (ref 0–40)

## 2021-09-28 LAB — GLUCOSE, POCT (MANUAL RESULT ENTRY): POC Glucose: 476 mg/dl — AB (ref 70–99)

## 2021-09-28 MED ORDER — CYCLOBENZAPRINE HCL 10 MG PO TABS: 10.0000 mg | ORAL_TABLET | Freq: Three times a day (TID) | ORAL | 0 refills | Status: DC | PRN

## 2021-09-28 NOTE — Progress Notes (Signed)
BP 135/89    Pulse 100    Temp (!) 97.2 F (36.2 C)    Wt (!) 367 lb (166.5 kg)    SpO2 98%    BMI 57.48 kg/m    Subjective:    Patient ID: [name], male    DOB: 1966/02/17, 56 y.o.   MRN: [id number]  HPI: [name] is a 56 y.o. male presenting on 09/28/2021 for Follow-up, Hypertension, and Diabetes   HPI  Pt is 23yoM with uncontrolled DM who was last seen 08/05/21.  He missed his appointment for follow up in December.  Pt has not seen endocrinologist since 02/26/21.  He was encouraged at his November appointment to call to reschedule with endocrinologist but he has not done this.  Pt stopped his lantus about 1 month ago.  He says it was making him have muscle spasms.    Pt says his muscle spasms and his usual life stresses (about care for his son) are his only complaints.  He has lost about 20 pounds since he was here in November due to his diabetes being uncontrolled.  Lab called about noon today with critical glucose of 539.  Pt was called at home and he said he felt well and had not taken any insulin yet that day.  He was instructed to take his insulin.  At appointment, pt says he took his apidra.  Fingerstick revealed bs now at 476.     Relevant past medical, surgical, family and social history reviewed and updated as indicated. Interim medical history since our last visit reviewed. Allergies and medications reviewed and updated.   Current Outpatient Medications:    allopurinol (ZYLOPRIM) 300 MG tablet, Take 1 tablet (300 mg total) by mouth daily., Disp: 30 tablet, Rfl: 5   amLODipine (NORVASC) 5 MG tablet, Take 1 tablet (5 mg total) by mouth daily., Disp: 90 tablet, Rfl: 0   atorvastatin (LIPITOR) 40 MG tablet, Take 1 tablet (40 mg total) by mouth daily., Disp: 90 tablet, Rfl: 1   furosemide (LASIX) 20 MG tablet, Take 1 tablet (20 mg total) by mouth daily., Disp: 30 tablet, Rfl: 1   Insulin Glulisine (APIDRA SOLOSTAR [place]), Inject 16-22 Units into the skin in the morning,  at noon, and at bedtime., Disp: , Rfl:    lisinopril (ZESTRIL) 20 MG tablet, Take 1 tablet (20 mg total) by mouth daily., Disp: 90 tablet, Rfl: 0   metFORMIN (GLUCOPHAGE XR) 500 MG 24 hr tablet, Take 2 tablets (1,000 mg total) by mouth daily with breakfast., Disp: 90 tablet, Rfl: 1   metoprolol tartrate (LOPRESSOR) 100 MG tablet, Take 1 tablet (100 mg total) by mouth 2 (two) times daily., Disp: 180 tablet, Rfl: 0   oxyCODONE-acetaminophen (PERCOCET) 10-325 MG tablet, Take 1 tablet by mouth every 4 (four) hours as needed for pain., Disp: 170 tablet, Rfl: 0   sitaGLIPtin (JANUVIA) 100 MG tablet, Take 1 tablet (100 mg total) by mouth daily., Disp: 90 tablet, Rfl: 0   cyclobenzaprine (FLEXERIL) 10 MG tablet, Take 1 tablet (10 mg total) by mouth 3 (three) times daily as needed for muscle spasms. (Patient not taking: Reported on 07/08/2021), Disp: 30 tablet, Rfl: 0   insulin glargine (LANTUS) 100 UNIT/ML injection, Inject 0.8 mLs (80 Units total) into the skin at bedtime. (Patient not taking: Reported on 09/28/2021), Disp: 30 mL, Rfl: 3    Review of Systems  Per HPI unless specifically indicated above     Objective:    BP  135/89    Pulse 100    Temp (!) 97.2 F (36.2 C)    Wt (!) 367 lb (166.5 kg)    SpO2 98%    BMI 57.48 kg/m   Wt Readings from Last 3 Encounters:  09/28/21 (!) 367 lb (166.5 kg)  08/04/21 (!) 385 lb (174.6 kg)  07/08/21 (!) 384 lb (174.2 kg)    Physical Exam Vitals reviewed.  Constitutional:      General: He is not in acute distress.    Appearance: He is well-developed. He is obese. He is not toxic-appearing.  HENT:     Head: Normocephalic and atraumatic.  Cardiovascular:     Rate and Rhythm: Normal rate and regular rhythm.  Pulmonary:     Effort: Pulmonary effort is normal.     Breath sounds: Normal breath sounds. No wheezing.  Abdominal:     General: Bowel sounds are normal.     Palpations: Abdomen is soft.     Tenderness: There is no abdominal tenderness.   Musculoskeletal:     Cervical back: Neck supple.  Lymphadenopathy:     Cervical: No cervical adenopathy.  Skin:    General: Skin is warm and dry.     Comments: LE wounds as pictured  Neurological:     Mental Status: He is alert and oriented to person, place, and time.  Psychiatric:        Behavior: Behavior normal.               Results for orders placed or performed in visit on 09/28/21  POCT Urinalysis Dipstick  Result Value Ref Range   Color, UA     Clarity, UA     Glucose, UA Positive (A) Negative   Bilirubin, UA negative    Ketones, UA negative    Spec Grav, UA <=1.005 (A) 1.010 - 1.025   Blood, UA negative    pH, UA 6.0 5.0 - 8.0   Protein, UA Negative Negative   Urobilinogen, UA 0.2 0.2 or 1.0 E.U./dL   Nitrite, UA negative    Leukocytes, UA Trace (A) Negative   Appearance     Odor    POCT Glucose (CBG)  Result Value Ref Range   POC Glucose 476 (A) 70 - 99 mg/dl      Assessment & Plan:    Encounter Diagnoses  Name Primary?   Type 2 diabetes mellitus with hyperglycemia, unspecified whether long term insulin use (HCC) Yes   Alkaline phosphatase elevation    Hepatic abscess    Uncontrolled type 2 diabetes mellitus with hyperglycemia (HCC)    Essential hypertension    Hyperlipidemia, unspecified hyperlipidemia type    Anxiety    Eating disorder, unspecified type    Morbid obesity ([place])    Wound of left lower extremity, subsequent encounter    Wound of right lower extremity, initial encounter      -reviewed labs with pt  -pt is urged to GET BACK ON LANTUS.  Pt was scheduled to see endocrine next week.  He is told to take meter and bs log with him  urinalysis negative for ketones today.  Discussed at length with pt that he needs to take both his lantus and apidra and monitor his blood sugars.  Discussed that death was a possiblity due to his extreme blood sugars so getting back to using his insulin was extremely important.    -Gave flexeril rx  for muscle spasms.  Discussed that lantus is not likely cause of  his spasms but giving this rx may help increase liklihood that he will use his lantus, will give him the rx for the flexeril.   -discussed need to increase atorvastatin due to poorly controlled lipids but will hold off due to increase in alk phos  -discussed Elevation in alk phos.  Pt has history of liver abscess.   Schedule ct in light of elevated alk phos and hx abscess  -Refer for annual DM eye exam  -pt to follow up 2 wk to review CT and check leg and general f/u.  Pt to contact office sooner for problems

## 2021-09-29 LAB — HEMOGLOBIN A1C
Hgb A1c MFr Bld: >15.5 % — ABNORMAL HIGH (ref 4.8–5.6)
Mean Plasma Glucose: >398 mg/dL

## 2021-10-03 ENCOUNTER — Ambulatory Visit (HOSPITAL_BASED_OUTPATIENT_CLINIC_OR_DEPARTMENT_OTHER): Admission: RE | Admit: 2021-10-03 | Payer: Self-pay | Source: Ambulatory Visit

## 2021-10-06 ENCOUNTER — Other Ambulatory Visit: Payer: Self-pay

## 2021-10-06 ENCOUNTER — Encounter: Payer: Self-pay | Admitting: Nurse Practitioner

## 2021-10-06 ENCOUNTER — Ambulatory Visit (INDEPENDENT_AMBULATORY_CARE_PROVIDER_SITE_OTHER): Payer: Self-pay | Admitting: Nurse Practitioner

## 2021-10-06 VITALS — BP 143/80 | HR 106 | Ht 67.0 in | Wt 367.0 lb

## 2021-10-06 DIAGNOSIS — E782 Mixed hyperlipidemia: Secondary | ICD-10-CM

## 2021-10-06 DIAGNOSIS — I10 Essential (primary) hypertension: Secondary | ICD-10-CM

## 2021-10-06 DIAGNOSIS — E1165 Type 2 diabetes mellitus with hyperglycemia: Secondary | ICD-10-CM

## 2021-10-06 MED ORDER — INSULIN GLARGINE 100 UNIT/ML ~~LOC~~ SOLN: 60.0000 [IU] | Freq: Every day | SUBCUTANEOUS | 3 refills | Status: DC

## 2021-10-06 NOTE — Progress Notes (Signed)
10/06/2021, 2:23 PM      Endocrinology Follow Up Visit  Subjective:    Patient ID: [name], male    DOB: [birth year].  [name] is being seen in follow up for management of currently uncontrolled symptomatic diabetes requested by  [name], PA-C.   Past Medical History:  Diagnosis Date   Arthritis    Diabetes mellitus without complication ([place])    diagnosed about age 56   Gout    Hypertension    boarderline    Past Surgical History:  Procedure Laterality Date   HERNIA REPAIR  2008   MASS EXCISION Left age 50   L thigh- benign    Social History   Socioeconomic History   Marital status: Single    Spouse name: Not on file   Number of children: Not on file   Years of education: Not on file   Highest education level: Not on file  Occupational History   Not on file  Tobacco Use   Smoking status: Former    Packs/day: 0.50    Years: 5.00    Pack years: 2.50    Types: Cigarettes    Quit date: 35    Years since quitting: 33.1   Smokeless tobacco: Never  Vaping Use   Vaping Use: Never used  Substance and Sexual Activity   Alcohol use: No   Drug use: No   Sexual activity: Not on file  Other Topics Concern   Not on file  Social History Narrative   Not on file   Social Determinants of Health   Financial Resource Strain: Not on file  Food Insecurity: Not on file  Transportation Needs: Not on file  Physical Activity: Not on file  Stress: Not on file  Social Connections: Not on file    Family History  Problem Relation Age of Onset   Cancer Mother    Diabetes Mother    Heart failure Father    Hypertension Father    Diabetes Father    Heart attack Father     Outpatient Encounter Medications as of 10/06/2021  Medication Sig   allopurinol (ZYLOPRIM) 300 MG tablet Take 1 tablet (300 mg total) by mouth daily.   amLODipine (NORVASC) 5 MG tablet Take 1 tablet (5 mg total) by  mouth daily.   atorvastatin (LIPITOR) 40 MG tablet Take 1 tablet (40 mg total) by mouth daily.   cyclobenzaprine (FLEXERIL) 10 MG tablet Take 1 tablet (10 mg total) by mouth 3 (three) times daily as needed for muscle spasms.   furosemide (LASIX) 20 MG tablet Take 1 tablet (20 mg total) by mouth daily.   Insulin Glulisine (APIDRA SOLOSTAR [place]) Inject 16-22 Units into the skin in the morning, at noon, and at bedtime.   lisinopril (ZESTRIL) 20 MG tablet Take 1 tablet (20 mg total) by mouth daily.   metFORMIN (GLUCOPHAGE XR) 500 MG 24 hr tablet Take 2 tablets (1,000 mg total) by mouth daily with breakfast.   metoprolol tartrate (LOPRESSOR) 100 MG tablet Take 1 tablet (100 mg total) by mouth 2 (two) times daily.   oxyCODONE-acetaminophen (PERCOCET) 10-325 MG tablet Take 1 tablet by mouth every 4 (four) hours as needed for  pain.   sitaGLIPtin (JANUVIA) 100 MG tablet Take 1 tablet (100 mg total) by mouth daily.   insulin glargine (LANTUS) 100 UNIT/ML injection Inject 0.6 mLs (60 Units total) into the skin at bedtime.   [DISCONTINUED] insulin glargine (LANTUS) 100 UNIT/ML injection Inject 0.8 mLs (80 Units total) into the skin at bedtime. (Patient not taking: Reported on 09/28/2021)   No facility-administered encounter medications on file as of 10/06/2021.    ALLERGIES: No Known Allergies  VACCINATION STATUS: Immunization History  Administered Date(s) Administered   Influenza,inj,Quad PF,6+ Mos 06/30/2017   Moderna Sars-Covid-2 Vaccination 04/01/2020, 04/29/2020    Diabetes He presents for his follow-up diabetic visit. He has type 2 diabetes mellitus. Onset time: He was diagnosed at approximate age of 56 years. His disease course has been worsening. There are no hypoglycemic associated symptoms. Pertinent negatives for hypoglycemia include no confusion, headaches, pallor or seizures. Associated symptoms include blurred vision, fatigue, polydipsia, polyphagia, polyuria and weight loss. Pertinent  negatives for diabetes include no chest pain and no weakness. There are no hypoglycemic complications. Symptoms are worsening. There are no diabetic complications. Risk factors for coronary artery disease include dyslipidemia, diabetes mellitus, hypertension, male sex, obesity and sedentary lifestyle. Current diabetic treatment includes intensive insulin program and oral agent (dual therapy). He is compliant with treatment some of the time (stopped his Lantus due to muscle cramps). His weight is fluctuating minimally. He is following a generally unhealthy diet. When asked about meal planning, he reported none. He has not had a previous visit with a dietitian. He never participates in exercise. His home blood glucose trend is increasing rapidly. His breakfast blood glucose range is generally >200 mg/dl. His overall blood glucose range is >200 mg/dl. (He presents today with his meter, no logs, showing persistent hyperglycemia.  His most recent A1c on 1/23 was >15.5%.  He stopped taking his Lantus in between visits due to intense muscle cramps which he though were being caused by his long-acting insulin.  He has been taking his other diabetes medications as prescribed.  He denies any hypoglycemia.  Analysis of his meter shows 7-day average of 515, 14-day average of 566, 30-day average of 570.) An ACE inhibitor/angiotensin II receptor blocker is being taken. He does not see a podiatrist.Eye exam is current.  Hyperlipidemia This is a chronic problem. The current episode started more than 1 year ago. The problem is uncontrolled. Recent lipid tests were reviewed and are high. Exacerbating diseases include diabetes and obesity. Factors aggravating his hyperlipidemia include beta blockers and fatty foods. Pertinent negatives include no chest pain, myalgias or shortness of breath. Current antihyperlipidemic treatment includes statins. The current treatment provides moderate improvement of lipids. Compliance problems include  adherence to diet, adherence to exercise and medication cost.  Risk factors for coronary artery disease include dyslipidemia, diabetes mellitus, family history, hypertension, male sex, obesity and a sedentary lifestyle.  Hypertension This is a chronic problem. The current episode started more than 1 year ago. The problem has been resolved since onset. The problem is controlled. Associated symptoms include blurred vision. Pertinent negatives include no chest pain, headaches, malaise/fatigue, neck pain, palpitations or shortness of breath. There are no associated agents to hypertension. Risk factors for coronary artery disease include dyslipidemia, diabetes mellitus, male gender, obesity and sedentary lifestyle. Past treatments include ACE inhibitors, beta blockers, calcium channel blockers and diuretics. The current treatment provides mild improvement. Compliance problems include medication cost, diet and exercise.    Review of systems  Constitutional: + rapidly decreasing  body weight,  current Body mass index is 57.48 kg/m., + fatigue, no subjective hyperthermia, no subjective hypothermia Eyes: + blurry vision, no xerophthalmia ENT: no sore throat, no nodules palpated in throat, no dysphagia/odynophagia, no hoarseness Cardiovascular: no chest pain, no shortness of breath, no palpitations, no leg swelling Respiratory: no cough, no shortness of breath Gastrointestinal: no nausea/vomiting/diarrhea Genitourinary: + polyuria Musculoskeletal: + generalized muscle/joint aches Skin: no rashes, no hyperemia Neurological: no tremors, no numbness, no tingling, no dizziness Psychiatric: no depression, no anxiety    Objective:    BP (!) 143/80    Pulse (!) 106    Ht 5\' 7"  (1.702 m)    Wt (!) 367 lb (166.5 kg)    SpO2 99%    BMI 57.48 kg/m   Wt Readings from Last 3 Encounters:  10/06/21 (!) 367 lb (166.5 kg)  09/28/21 (!) 367 lb (166.5 kg)  08/04/21 (!) 385 lb (174.6 kg)    BP Readings from Last 3  Encounters:  10/06/21 (!) 143/80  09/28/21 135/89  08/04/21 (!) 166/104     Physical Exam- Limited  Constitutional:  Body mass index is 57.48 kg/m. , not in acute distress, normal state of mind Eyes:  EOMI, no exophthalmos Neck: Supple Cardiovascular: RRR, no murmurs, rubs, or gallops, no edema Respiratory: Adequate breathing efforts, no crackles, rales, rhonchi, or wheezing Musculoskeletal: no gross deformities, strength intact in all four extremities, no gross restriction of joint movements Skin:  no rashes, no hyperemia Neurological: no tremor with outstretched hands   CMP ( most recent) CMP     Component Value Date/Time   NA 127 (L) 09/28/2021 1120   K 4.8 09/28/2021 1120   CL 93 (L) 09/28/2021 1120   CO2 25 09/28/2021 1120   GLUCOSE 539 (HH) 09/28/2021 1120   BUN 10 09/28/2021 1120   CREATININE 0.73 09/28/2021 1120   CREATININE 1.42 (H) 09/08/2017 1540   CALCIUM 9.0 09/28/2021 1120   PROT 8.1 09/28/2021 1120   ALBUMIN 3.5 09/28/2021 1120   AST 26 09/28/2021 1120   ALT 33 09/28/2021 1120   ALKPHOS 291 (H) 09/28/2021 1120   BILITOT 0.7 09/28/2021 1120   GFRNONAA >60 09/28/2021 1120   GFRAA >60 03/17/2020 1200     Diabetic Labs (most recent): Lab Results  Component Value Date   HGBA1C >15.5 (H) 09/28/2021   HGBA1C >15.5 (H) 07/07/2021   HGBA1C 14.9 (A) 01/21/2021     Lipid Panel ( most recent) Lipid Panel     Component Value Date/Time   CHOL 264 (H) 09/28/2021 1119   TRIG 261 (H) 09/28/2021 1119   HDL 46 09/28/2021 1119   CHOLHDL 5.7 09/28/2021 1119   VLDL 52 (H) 09/28/2021 1119   LDLCALC 166 (H) 09/28/2021 1119      Lab Results  Component Value Date   TSH 0.532 06/28/2017       Assessment & Plan:   1) Uncontrolled type 2 diabetes mellitus with hyperglycemia ([facility])  - [name] has currently uncontrolled symptomatic type 2 DM since  56 years of age.  He presents today with his meter, no logs, showing persistent hyperglycemia.  His most  recent A1c on 1/23 was >15.5%.  He stopped taking his Lantus in between visits due to intense muscle cramps which he though were being caused by his long-acting insulin.  He has been taking his other diabetes medications as prescribed.  He denies any hypoglycemia.  Analysis of his meter shows 7-day average of 515, 14-day average of  566, 30-day average of 570.  -Recent labs reviewed.  - I had a long discussion with him about the progressive nature of diabetes and the pathology behind its complications. -his diabetes is complicated by obesity/sedentary life, inadequate insurance coverage,  And he remains at a high risk for more acute and chronic complications which include CAD, CVA, CKD, retinopathy, and neuropathy. These are all discussed in detail with him.  - Nutritional counseling repeated at each appointment due to patients tendency to fall back in to old habits.  - The patient admits there is a room for improvement in their diet and drink choices. -  Suggestion is made for the patient to avoid simple carbohydrates from their diet including Cakes, Sweet Desserts / Pastries, Ice Cream, Soda (diet and regular), Sweet Tea, Candies, Chips, Cookies, Sweet Pastries, Store Bought Juices, Alcohol in Excess of 1-2 drinks a day, Artificial Sweeteners, Coffee Creamer, and "Sugar-free" Products. This will help patient to have stable blood glucose profile and potentially avoid unintended weight gain.   - I encouraged the patient to switch to unprocessed or minimally processed complex starch and increased protein intake (animal or plant source), fruits, and vegetables.   - Patient is advised to stick to a routine mealtimes to eat 3 meals a day and avoid unnecessary snacks (to snack only to correct hypoglycemia).  - he will be scheduled with Jearld Fenton, RDN, CDE for diabetes education.  - I have approached him with the following individualized plan to manage  his diabetes and patient agrees:   -He is  advised to restart his Lantus at 60 units SQ nightly and continue his Apidra 16-22 units TID with meals if glucose is above 90 and he is eating (Specific instructions on how to titrate insulin dosage based on glucose readings given to patient in writing).  He can also continue Metformin 1000 mg ER PO daily with breakfast and Januvia 100 mg po daily with breakfast for now.  He could benefit from GLP1 incretin therapy for added benefit of weight loss but cost is a concern for him.  I asked that he reach out to his PCP who helped him set up PAP for his current meds to see if Ozempic or Trulicity would be an option to get started.  This would take place of his Januvia, if he gets assistance financially to afford it.   He is encouraged consistently monitor blood glucose 4 times per day, before meals and at bedtime and report to the clinic if readings are less than 70 or greater than 300 for 3 tests in a row.  - Specific targets for  A1c;  LDL, HDL,  and Triglycerides were discussed with the patient.  2) Blood Pressure /Hypertension:   His blood pressure is controlled to target.  He is advised to continue Norvasc 5 mg po daily, Lasix 20 mg po daily, Lisinopril 20 mg po daily, and Metoprolol 100 mg po twice daily.    3) Lipids/Hyperlipidemia:   His recent lipid panel from 09/28/21 shows uncontrolled LDL of 166 and elevated triglycerides of 261.  He is advised to continue Atorvastatin 40 mg po daily at bedtime.  Side effects and precautions discussed with him.    4)  Weight/Diet:  His Body mass index is 57.48 kg/m.-   clearly complicating his diabetes care.   he is  a candidate for modest weight loss. I discussed with him the fact that loss of 5 - 10% of his  current body weight will have  the most impact on his diabetes management.  Exercise, and detailed carbohydrates information provided  -  detailed on discharge instructions.  He is a good candidate for bariatric surgery, however at this point he has no  insurance.  5) Chronic Care/Health Maintenance: -he is on ACEI/ARB and Statin medications and is encouraged to initiate and continue to follow up with Ophthalmology, Dentist,  Podiatrist at least yearly or according to recommendations, and advised to stay away from smoking. I have recommended yearly flu vaccine and pneumonia vaccine at least every 5 years; moderate intensity exercise for up to 150 minutes weekly; and  sleep for at least 7 hours a day.  - he is advised to maintain close follow up with [name], PA-C for primary care needs, as well as his other providers for optimal and coordinated care.      I spent 32 minutes in the care of the patient today including review of labs from [place], Lipids, Thyroid Function, Hematology (current and previous including abstractions from other facilities); face-to-face time discussing  his blood glucose readings/logs, discussing hypoglycemia and hyperglycemia episodes and symptoms, medications doses, his options of short and long term treatment based on the latest standards of care / guidelines;  discussion about incorporating lifestyle medicine;  and documenting the encounter.    Please refer to Patient Instructions for Blood Glucose Monitoring and Insulin/Medications Dosing Guide"  in media tab for additional information. Please  also refer to " Patient Self Inventory" in the Media  tab for reviewed elements of pertinent patient history.  [name] participated in the discussions, expressed understanding, and voiced agreement with the above plans.  All questions were answered to his satisfaction. he is encouraged to contact clinic should he have any questions or concerns prior to his return visit.   Follow up plan: - Return in about 2 weeks (around 10/20/2021) for Diabetes F/U, Bring meter and logs.  Rayetta Pigg, [facility] [address] [place], [place] [address] Phone: [phone] Fax:  [phone]  10/06/2021, 2:23 PM

## 2021-10-06 NOTE — Patient Instructions (Signed)

## 2021-10-07 ENCOUNTER — Other Ambulatory Visit: Payer: Self-pay | Admitting: Nurse Practitioner

## 2021-10-07 MED ORDER — TRULICITY 0.75 MG/0.5ML ~~LOC~~ SOAJ: 0.7500 mg | SUBCUTANEOUS | 0 refills | Status: DC

## 2021-10-07 MED ORDER — TRULICITY 1.5 MG/0.5ML ~~LOC~~ SOAJ: 1.5000 mg | SUBCUTANEOUS | 3 refills | Status: DC

## 2021-10-07 NOTE — Progress Notes (Signed)
As discussed at last visit, I wanted to switch patient to Trulicity (instead of Januvia) for the added benefit of weight loss.  I recently heard from his PCP who said he is eligible for this medication through [facility].  I did send in this Rx today.

## 2021-10-08 ENCOUNTER — Telehealth: Payer: Self-pay | Admitting: Orthopaedic Surgery

## 2021-10-08 ENCOUNTER — Other Ambulatory Visit: Payer: Self-pay | Admitting: Physician Assistant

## 2021-10-08 ENCOUNTER — Telehealth: Payer: Self-pay | Admitting: Nurse Practitioner

## 2021-10-08 ENCOUNTER — Telehealth: Payer: Self-pay | Admitting: Physician Assistant

## 2021-10-08 MED ORDER — OXYCODONE-ACETAMINOPHEN 10-325 MG PO TABS: 1.0000 | ORAL_TABLET | ORAL | 0 refills | Status: DC | PRN

## 2021-10-08 NOTE — Telephone Encounter (Signed)
Patient called in stating he was having really bad cramping in his legs. Patient stated when he went to see his endocrine doctor they told him his sodium was low and that was why he was having cramps. After speaking with provider, patient was told to that the reason his sodium in low is because his blood sugars were very high and that was what was causing his sodium to be low. Patient was encourage to get blood sugars under control and to take his Lantus. Patient was also told he could take warm showers or baths to help with crapping. He could also use warm compresses.

## 2021-10-08 NOTE — Telephone Encounter (Signed)
Pt states he told you about spasms he was having and that he is having them again and you mentioned because of his sodium being low. He wants to know if there is something he can take to help

## 2021-10-08 NOTE — Telephone Encounter (Signed)
Patient called requesting refill for his pain medicine.   oxyCODONE-acetaminophen (PERCOCET) 10-325 MG tablet  Pharmac: [facility] [place]

## 2021-10-08 NOTE — Telephone Encounter (Signed)
He needs to reach out to [facility], his PCP regarding this as it may be something else entirely.  He needs more work up to find the reason.

## 2021-10-08 NOTE — Telephone Encounter (Signed)
Informed pt .

## 2021-10-12 ENCOUNTER — Other Ambulatory Visit: Payer: Self-pay | Admitting: Physician Assistant

## 2021-10-12 MED ORDER — METOPROLOL TARTRATE 100 MG PO TABS: 100.0000 mg | ORAL_TABLET | Freq: Two times a day (BID) | ORAL | 0 refills | Status: DC

## 2021-10-12 MED ORDER — AMLODIPINE BESYLATE 5 MG PO TABS: 5.0000 mg | ORAL_TABLET | Freq: Every day | ORAL | 0 refills | Status: DC

## 2021-10-12 MED ORDER — LISINOPRIL 20 MG PO TABS: 20.0000 mg | ORAL_TABLET | Freq: Every day | ORAL | 0 refills | Status: DC

## 2021-10-12 MED ORDER — METFORMIN HCL ER 500 MG PO TB24: 1000.0000 mg | ORAL_TABLET | Freq: Every day | ORAL | 1 refills | Status: DC

## 2021-10-12 MED ORDER — FUROSEMIDE 20 MG PO TABS: 20.0000 mg | ORAL_TABLET | Freq: Every day | ORAL | 1 refills | Status: AC

## 2021-10-12 MED ORDER — ATORVASTATIN CALCIUM 40 MG PO TABS: 40.0000 mg | ORAL_TABLET | Freq: Every day | ORAL | 1 refills | Status: DC

## 2021-10-13 ENCOUNTER — Encounter: Payer: Self-pay | Admitting: Physician Assistant

## 2021-10-13 ENCOUNTER — Other Ambulatory Visit: Payer: Self-pay

## 2021-10-13 ENCOUNTER — Ambulatory Visit: Payer: Self-pay | Admitting: Physician Assistant

## 2021-10-13 ENCOUNTER — Ambulatory Visit: Payer: Self-pay | Admitting: Licensed Clinical Social Worker

## 2021-10-13 VITALS — BP 137/76 | HR 100 | Temp 97.6°F | Wt 373.0 lb

## 2021-10-13 DIAGNOSIS — E1165 Type 2 diabetes mellitus with hyperglycemia: Secondary | ICD-10-CM

## 2021-10-13 DIAGNOSIS — I87313 Chronic venous hypertension (idiopathic) with ulcer of bilateral lower extremity: Secondary | ICD-10-CM

## 2021-10-13 DIAGNOSIS — I878 Other specified disorders of veins: Secondary | ICD-10-CM

## 2021-10-13 DIAGNOSIS — L97919 Non-pressure chronic ulcer of unspecified part of right lower leg with unspecified severity: Secondary | ICD-10-CM

## 2021-10-13 DIAGNOSIS — L089 Local infection of the skin and subcutaneous tissue, unspecified: Secondary | ICD-10-CM

## 2021-10-13 MED ORDER — CEPHALEXIN 500 MG PO CAPS: 500.0000 mg | ORAL_CAPSULE | Freq: Four times a day (QID) | ORAL | 0 refills | Status: DC

## 2021-10-13 NOTE — Progress Notes (Signed)
BP 137/76    Pulse 100    Temp 97.6 F (36.4 C)    Wt (!) 373 lb (169.2 kg)    SpO2 97%    BMI 58.42 kg/m    Subjective:    Patient ID: [name], male    DOB: 01-Dec-1965, 56 y.o.   MRN: [id number]  HPI: [name] is a 56 y.o. male presenting on 10/13/2021 for Follow-up   HPI   Pt is 50yoM with multiple medical problems including uncontrolled DM, super morbid obesity, eating disorder,  HTN, dyslipidemia and lower extremity wounds, all of which are complicated by chronic noncompliance.  He is taking opioids regularly for his knee pain.  He continues to report a lot of stress related to caring for his special needs son.    He has been seen by endocrinology since his appointment here on 09/28/21.  He is using his lantus.  He has appointment with endocrinology to follow up next week.  Pt reports FBS 236 today.  He says he is checking his blood sugars and is keeping a log.  He was no-show to CT ordered for January 28.  He says he got it rescheduled to tomorrow.   It was ordered as a result of his alk phos elevation and history of liver abscess  Pt is continuing to have LE wounds.  These have been ongoing since at least his 04/07/21 appointment.   Pt was seeing [place] counselor at this facility but missed his appointment and did not reschedule.  He says he is still interested in receiving counseling.   His last appointment with counselor was January 4.       Relevant past medical, surgical, family and social history reviewed and updated as indicated. Interim medical history since our last visit reviewed. Allergies and medications reviewed and updated.  CURRENT MEDS: Lantus Otherwise uncertain as he says he is taking "everything on the list"   Review of Systems  Per HPI unless specifically indicated above     Objective:    BP 137/76    Pulse 100    Temp 97.6 F (36.4 C)    Wt (!) 373 lb (169.2 kg)    SpO2 97%    BMI 58.42 kg/m   Wt Readings from Last 3 Encounters:  10/13/21 (!)  373 lb (169.2 kg)  10/06/21 (!) 367 lb (166.5 kg)  09/28/21 (!) 367 lb (166.5 kg)    Physical Exam Constitutional:      General: He is not in acute distress.    Appearance: He is obese. He is not toxic-appearing.  HENT:     Head: Normocephalic and atraumatic.  Pulmonary:     Effort: No respiratory distress.  Musculoskeletal:     Comments: Chronic stasis wounds present BLE.  Wound on RLE now with purulence as pictured  Neurological:     Mental Status: He is alert and oriented to person, place, and time.  Psychiatric:        Attention and Perception: Attention normal.        Speech: Speech normal.           Assessment & Plan:    Encounter Diagnoses  Name Primary?   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  In light of his weight and poorly controlled DM, healing will be very difficult.   Referral to wound clinic would be beneficial for the condition of his leg but he has been so noncompliant and continues to be noncompliant, will see if improvement can be obtained before referring.  -pt has missed his appointment with counseling but says he doesn't want to rescheduled at this time because he is busy.  Pt will reschedule with [facility] later -pt was urged to work closely with endocrinology for his blood sugars.  He is aware that healing will be difficult if not impossible with his sugar and elevated as it has been recently (a1c > 15.5) -pt to follow up 2-3 weeks.  He is to contact office right away for any worsening

## 2021-10-14 ENCOUNTER — Ambulatory Visit (HOSPITAL_COMMUNITY): Payer: Self-pay

## 2021-10-15 ENCOUNTER — Other Ambulatory Visit: Payer: Self-pay

## 2021-10-15 ENCOUNTER — Encounter: Payer: Self-pay | Admitting: Urology

## 2021-10-15 ENCOUNTER — Ambulatory Visit (INDEPENDENT_AMBULATORY_CARE_PROVIDER_SITE_OTHER): Payer: Self-pay | Admitting: Urology

## 2021-10-15 VITALS — BP 140/86 | HR 111 | Wt 373.0 lb

## 2021-10-15 DIAGNOSIS — N471 Phimosis: Secondary | ICD-10-CM

## 2021-10-15 DIAGNOSIS — N481 Balanitis: Secondary | ICD-10-CM

## 2021-10-15 DIAGNOSIS — E230 Hypopituitarism: Secondary | ICD-10-CM

## 2021-10-15 DIAGNOSIS — N4883 Acquired buried penis: Secondary | ICD-10-CM

## 2021-10-15 DIAGNOSIS — R300 Dysuria: Secondary | ICD-10-CM

## 2021-10-15 LAB — URINALYSIS, ROUTINE W REFLEX MICROSCOPIC
Bilirubin, UA: NEGATIVE
Ketones, UA: NEGATIVE
Leukocytes,UA: NEGATIVE
Nitrite, UA: NEGATIVE
Protein,UA: NEGATIVE
RBC, UA: NEGATIVE
Specific Gravity, UA: <1.005 — ABNORMAL LOW (ref 1.005–1.030)
Urobilinogen, Ur: 0.2 mg/dL (ref 0.2–1.0)
pH, UA: 5.5 (ref 5.0–7.5)

## 2021-10-15 MED ORDER — NYSTATIN 100000 UNIT/GM EX CREA: 1.0000 "application " | TOPICAL_CREAM | Freq: Two times a day (BID) | CUTANEOUS | 3 refills | Status: DC

## 2021-10-15 NOTE — Progress Notes (Signed)
Subjective: 1. Burning with urination   2. Acquired buried penis   3. Phimosis   4. Balanitis   5. 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  His PSA on 07/07/21 was stable at 0.26.   He is a very poorly controlled diabetic with a glucose of 539  and Hgb A1c of >15.5 on 09/28/21.  He hasn't had a recent testosterone level.   He is concerned today about a retracted penis and having to sit to void.   He is losing weight.  He has frequency and urgency with nocturia that is probably secondary to polydipsia from the diabetes.   UA has 3+ glucose but is otherwise clear.  ROS:  Review of Systems  Constitutional:  Positive for malaise/fatigue.  Gastrointestinal:  Positive for heartburn.  Genitourinary:  Positive for dysuria, frequency and urgency. Negative for hematuria.       Incontinence, Intermittency  ED  Musculoskeletal:  Positive for back pain.  Skin:  Positive for itching.  Endo/Heme/Allergies:  Positive for polydipsia.   No Known Allergies  Past Medical History:  Diagnosis Date   Arthritis    Diabetes mellitus without complication ([place])    diagnosed about age 10   Gout    Hypertension    boarderline    Past Surgical History:  Procedure Laterality Date   HERNIA REPAIR  2008   MASS EXCISION Left age 73   L thigh- benign    Social History   Socioeconomic History   Marital status: Single    Spouse name: Not on file   Number of children: Not on file   Years of education: Not on file   Highest education level: Not on file  Occupational History   Not on file  Tobacco Use   Smoking status: Former    Packs/day: 0.50    Years: 5.00    Pack years: 2.50    Types: Cigarettes    Quit date: 32    Years since quitting: 33.1   Smokeless tobacco: Never  Vaping Use   Vaping Use: Never used  Substance and Sexual Activity   Alcohol use: No   Drug use: No    Sexual activity: Not on file  Other Topics Concern   Not on file  Social History Narrative   Not on file   Social Determinants of Health   Financial Resource Strain: Not on file  Food Insecurity: Not on file  Transportation Needs: Not on file  Physical Activity: Not on file  Stress: Not on file  Social Connections: Not on file  Intimate Partner Violence: Not on file    Family History  Problem Relation Age of Onset   Cancer Mother    Diabetes Mother    Heart failure Father    Hypertension Father    Diabetes Father    Heart attack Father     Anti-infectives: Anti-infectives (From admission, onward)    None       Current Outpatient Medications  Medication Sig Dispense Refill   allopurinol (ZYLOPRIM) 300 MG tablet Take 1 tablet (300 mg total) by mouth daily. 30 tablet 5   amLODipine (NORVASC) 5 MG tablet Take 1 tablet (5 mg total) by mouth daily. 90 tablet 0   atorvastatin (LIPITOR) 40 MG tablet Take 1 tablet (40 mg total) by mouth daily. 90 tablet 1   cephALEXin (  KEFLEX) 500 MG capsule Take 1 capsule (500 mg total) by mouth 4 (four) times daily. 28 capsule 0   cyclobenzaprine (FLEXERIL) 10 MG tablet Take 1 tablet (10 mg total) by mouth 3 (three) times daily as needed for muscle spasms. 30 tablet 0   Dulaglutide (TRULICITY) 6.73 AL/9.3XT SOPN Inject 0.75 mg into the skin once a week. 2 mL 0   Dulaglutide (TRULICITY) 1.5 KW/4.0XB SOPN Inject 1.5 mg into the skin once a week. 3 mL 3   furosemide (LASIX) 20 MG tablet Take 1 tablet (20 mg total) by mouth daily. 30 tablet 1   insulin glargine (LANTUS) 100 UNIT/ML injection Inject 0.6 mLs (60 Units total) into the skin at bedtime. 30 mL 3   Insulin Glulisine (APIDRA SOLOSTAR [place]) Inject 16-22 Units into the skin in the morning, at noon, and at bedtime.     lisinopril (ZESTRIL) 20 MG tablet Take 1 tablet (20 mg total) by mouth daily. 90 tablet 0   metFORMIN (GLUCOPHAGE XR) 500 MG 24 hr tablet Take 2 tablets (1,000 mg total) by  mouth daily with breakfast. 90 tablet 1   metoprolol tartrate (LOPRESSOR) 100 MG tablet Take 1 tablet (100 mg total) by mouth 2 (two) times daily. 180 tablet 0   nystatin cream (MYCOSTATIN) Apply 1 application topically 2 (two) times daily. To penile irritation. 30 g 3   oxyCODONE-acetaminophen (PERCOCET) 10-325 MG tablet Take 1 tablet by mouth every 4 (four) hours as needed for pain. 170 tablet 0   No current facility-administered medications for this visit.     Objective: Vital signs in last 24 hours: BP 140/86    Pulse (!) 111    Wt (!) 373 lb (169.2 kg)    BMI 58.42 kg/m   Intake/Output from previous day: No intake/output data recorded. Intake/Output this shift: @IOTHISSHIFT @   Physical Exam Vitals reviewed.  Constitutional:      Appearance: He is obese.  Genitourinary:    Comments: He has tight phimosis with a buried penis. Scrotum and testes are normal.  Neurological:     Mental Status: He is alert.    Lab Results:  Results for orders placed or performed in visit on 10/15/21 (from the past 24 hour(s))  Urinalysis, Routine w reflex microscopic     Status: Abnormal   Collection Time: 10/15/21  1:20 PM  Result Value Ref Range   Specific Gravity, UA <1.005 (L) 1.005 - 1.030   pH, UA 5.5 5.0 - 7.5   Color, UA Yellow Yellow   Appearance Ur Clear Clear   Leukocytes,UA Negative Negative   Protein,UA Negative Negative/Trace   Glucose, UA 3+ (A) Negative   Ketones, UA Negative Negative   RBC, UA Negative Negative   Bilirubin, UA Negative Negative   Urobilinogen, Ur 0.2 0.2 - 1.0 mg/dL   Nitrite, UA Negative Negative   Microscopic Examination Comment    Narrative   Performed at:  [facility] [address] [place], [place]  [id number] Lab Director: Gaylord, Phone:  [phone]     BMET No results for input(s): NA, K, CL, CO2, GLUCOSE, BUN, CREATININE, CALCIUM in the last 72 hours. PT/INR No results for input(s): LABPROT, INR in the last 72  hours. ABG No results for input(s): PHART, HCO3 in the last 72 hours.  Invalid input(s): PCO2, PO2  Studies/Results: No results found.   Assessment/Plan: He has a buried penis with tight phimosis and balanitis.   I am going to put him on  nystatin cream and refer him to Dr. Francesca Jewett at [facility].    Hypogonadism.   I will repeat a testosterone panel today.    Poorly controlled DM and obesity.  I reinforced the need for weight loss and diabetes control and suggested he consider being evaluated for bariatric surgery again.   Meds ordered this encounter  Medications   nystatin cream (MYCOSTATIN)    Sig: Apply 1 application topically 2 (two) times daily. To penile irritation.    Dispense:  30 g    Refill:  3     Orders Placed This Encounter  Procedures   Urinalysis, Routine w reflex microscopic   Testosterone Free, Profile I   Ambulatory referral to Urology    Referral Priority:   Routine    Referral Type:   Consultation    Referral Reason:   Specialty Services Required    Referred to Provider:   Margrett Rud, MD    Requested Specialty:   Urology    Number of Visits Requested:   1     Return in about 3 months (around 01/12/2022).    CC: [name] PA.      Irine Seal 10/16/2021 [phone]

## 2021-10-19 NOTE — Patient Instructions (Signed)

## 2021-10-20 ENCOUNTER — Encounter: Payer: Self-pay | Admitting: Nurse Practitioner

## 2021-10-20 ENCOUNTER — Other Ambulatory Visit: Payer: Self-pay

## 2021-10-20 ENCOUNTER — Ambulatory Visit (INDEPENDENT_AMBULATORY_CARE_PROVIDER_SITE_OTHER): Payer: Self-pay | Admitting: Nurse Practitioner

## 2021-10-20 VITALS — BP 117/75 | HR 104 | Ht 67.0 in | Wt 378.4 lb

## 2021-10-20 DIAGNOSIS — E1165 Type 2 diabetes mellitus with hyperglycemia: Secondary | ICD-10-CM

## 2021-10-20 DIAGNOSIS — E782 Mixed hyperlipidemia: Secondary | ICD-10-CM

## 2021-10-20 DIAGNOSIS — I10 Essential (primary) hypertension: Secondary | ICD-10-CM

## 2021-10-20 NOTE — Progress Notes (Signed)
10/20/2021, 3:29 PM      Endocrinology Follow Up Visit  Subjective:    Patient ID: [name], male    DOB: May 23, 1966.  [name] is being seen in follow up for management of currently uncontrolled symptomatic diabetes requested by  [name], PA-C.   Past Medical History:  Diagnosis Date   Arthritis    Diabetes mellitus without complication ([place])    diagnosed about age 56   Gout    Hypertension    boarderline    Past Surgical History:  Procedure Laterality Date   HERNIA REPAIR  2008   MASS EXCISION Left age 49   L thigh- benign    Social History   Socioeconomic History   Marital status: Single    Spouse name: Not on file   Number of children: Not on file   Years of education: Not on file   Highest education level: Not on file  Occupational History   Not on file  Tobacco Use   Smoking status: Former    Packs/day: 0.50    Years: 5.00    Pack years: 2.50    Types: Cigarettes    Quit date: 28    Years since quitting: 33.1   Smokeless tobacco: Never  Vaping Use   Vaping Use: Never used  Substance and Sexual Activity   Alcohol use: No   Drug use: No   Sexual activity: Not on file  Other Topics Concern   Not on file  Social History Narrative   Not on file   Social Determinants of Health   Financial Resource Strain: Not on file  Food Insecurity: Not on file  Transportation Needs: Not on file  Physical Activity: Not on file  Stress: Not on file  Social Connections: Not on file    Family History  Problem Relation Age of Onset   Cancer Mother    Diabetes Mother    Heart failure Father    Hypertension Father    Diabetes Father    Heart attack Father     Outpatient Encounter Medications as of 10/20/2021  Medication Sig   allopurinol (ZYLOPRIM) 300 MG tablet Take 1 tablet (300 mg total) by mouth daily.   amLODipine (NORVASC) 5 MG tablet Take 1 tablet (5 mg total) by  mouth daily.   atorvastatin (LIPITOR) 40 MG tablet Take 1 tablet (40 mg total) by mouth daily.   cephALEXin (KEFLEX) 500 MG capsule Take 1 capsule (500 mg total) by mouth 4 (four) times daily.   cyclobenzaprine (FLEXERIL) 10 MG tablet Take 1 tablet (10 mg total) by mouth 3 (three) times daily as needed for muscle spasms.   furosemide (LASIX) 20 MG tablet Take 1 tablet (20 mg total) by mouth daily.   insulin glargine (LANTUS) 100 UNIT/ML injection Inject 0.6 mLs (60 Units total) into the skin at bedtime.   Insulin Glulisine (APIDRA SOLOSTAR [place]) Inject 16-22 Units into the skin in the morning, at noon, and at bedtime.   lisinopril (ZESTRIL) 20 MG tablet Take 1 tablet (20 mg total) by mouth daily.   metFORMIN (GLUCOPHAGE XR) 500 MG 24 hr tablet Take 2 tablets (1,000 mg total) by mouth daily with breakfast.  metoprolol tartrate (LOPRESSOR) 100 MG tablet Take 1 tablet (100 mg total) by mouth 2 (two) times daily.   nystatin cream (MYCOSTATIN) Apply 1 application topically 2 (two) times daily. To penile irritation.   oxyCODONE-acetaminophen (PERCOCET) 10-325 MG tablet Take 1 tablet by mouth every 4 (four) hours as needed for pain.   Dulaglutide (TRULICITY) 9.67 EL/3.8BO SOPN Inject 0.75 mg into the skin once a week. (Patient not taking: Reported on 10/20/2021)   Dulaglutide (TRULICITY) 1.5 FB/5.1WC SOPN Inject 1.5 mg into the skin once a week. (Patient not taking: Reported on 10/20/2021)   No facility-administered encounter medications on file as of 10/20/2021.    ALLERGIES: No Known Allergies  VACCINATION STATUS: Immunization History  Administered Date(s) Administered   Influenza,inj,Quad PF,6+ Mos 06/30/2017   Moderna Sars-Covid-2 Vaccination 04/01/2020, 04/29/2020    Diabetes He presents for his follow-up diabetic visit. He has type 2 diabetes mellitus. Onset time: He was diagnosed at approximate age of 20 years. His disease course has been stable. There are no hypoglycemic associated  symptoms. Pertinent negatives for hypoglycemia include no confusion, headaches, pallor or seizures. Associated symptoms include blurred vision, fatigue, polydipsia, polyphagia, polyuria and weight loss. Pertinent negatives for diabetes include no chest pain and no weakness. There are no hypoglycemic complications. Symptoms are stable. There are no diabetic complications. Risk factors for coronary artery disease include dyslipidemia, diabetes mellitus, hypertension, male sex, obesity and sedentary lifestyle. Current diabetic treatment includes intensive insulin program and oral agent (dual therapy). He is compliant with treatment some of the time (still misses opportunities to inject insulin). His weight is fluctuating minimally. He is following a generally unhealthy diet. When asked about meal planning, he reported none. He has not had a previous visit with a dietitian. He never participates in exercise. His home blood glucose trend is fluctuating minimally. His overall blood glucose range is >200 mg/dl. (He presents today with his meter, no logs, showing slowly improving glycemic profile yet still grossly above target.  He was not due for another A1c today.  I did send in for Trulicity to take place of his Januvia, he has not yet received it from the mail-order pharmacy so he continues to use the rest of the Januvia he has.  He admits he still misses his short acting insulin for various reasons, has a special needs son that takes up his time.  Analysis of his meter shows 7-day average of 468, 14-day average of 476, 30-day average of 511 with inconsistent readings.) An ACE inhibitor/angiotensin II receptor blocker is being taken. He does not see a podiatrist.Eye exam is current.  Hyperlipidemia This is a chronic problem. The current episode started more than 1 year ago. The problem is uncontrolled. Recent lipid tests were reviewed and are high. Exacerbating diseases include diabetes and obesity. Factors  aggravating his hyperlipidemia include beta blockers and fatty foods. Pertinent negatives include no chest pain, myalgias or shortness of breath. Current antihyperlipidemic treatment includes statins. The current treatment provides moderate improvement of lipids. Compliance problems include adherence to diet, adherence to exercise and medication cost.  Risk factors for coronary artery disease include dyslipidemia, diabetes mellitus, family history, hypertension, male sex, obesity and a sedentary lifestyle.  Hypertension This is a chronic problem. The current episode started more than 1 year ago. The problem has been resolved since onset. The problem is controlled. Associated symptoms include blurred vision. Pertinent negatives include no chest pain, headaches, malaise/fatigue, neck pain, palpitations or shortness of breath. There are no associated agents to  hypertension. Risk factors for coronary artery disease include dyslipidemia, diabetes mellitus, male gender, obesity and sedentary lifestyle. Past treatments include ACE inhibitors, beta blockers, calcium channel blockers and diuretics. The current treatment provides mild improvement. Compliance problems include medication cost, diet and exercise.    Review of systems  Constitutional: + stable body weight,  current Body mass index is 59.27 kg/m., + fatigue, no subjective hyperthermia, no subjective hypothermia Eyes: + blurry vision, no xerophthalmia ENT: no sore throat, no nodules palpated in throat, no dysphagia/odynophagia, no hoarseness Cardiovascular: no chest pain, no shortness of breath, no palpitations, no leg swelling Respiratory: no cough, no shortness of breath Gastrointestinal: no nausea/vomiting/diarrhea Genitourinary: + polyuria Musculoskeletal: + generalized muscle/joint aches Skin: no rashes, no hyperemia, currently on antibiotics for infection in his leg which caused his leg cramps Neurological: no tremors, no numbness, no  tingling, no dizziness Psychiatric: no depression, no anxiety    Objective:    BP 117/75    Pulse (!) 104    Ht 5\' 7"  (1.702 m)    Wt (!) 378 lb 6.4 oz (171.6 kg)    SpO2 98%    BMI 59.27 kg/m   Wt Readings from Last 3 Encounters:  10/20/21 (!) 378 lb 6.4 oz (171.6 kg)  10/15/21 (!) 373 lb (169.2 kg)  10/13/21 (!) 373 lb (169.2 kg)    BP Readings from Last 3 Encounters:  10/20/21 117/75  10/15/21 140/86  10/13/21 137/76     Physical Exam- Limited  Constitutional:  Body mass index is 59.27 kg/m. , not in acute distress, normal state of mind Eyes:  EOMI, no exophthalmos Neck: Supple Cardiovascular: RRR, no murmurs, rubs, or gallops, no edema Respiratory: Adequate breathing efforts, no crackles, rales, rhonchi, or wheezing Musculoskeletal: no gross deformities, strength intact in all four extremities, no gross restriction of joint movements Skin:  no rashes, no hyperemia Neurological: no tremor with outstretched hands   CMP ( most recent) CMP     Component Value Date/Time   NA 127 (L) 09/28/2021 1120   K 4.8 09/28/2021 1120   CL 93 (L) 09/28/2021 1120   CO2 25 09/28/2021 1120   GLUCOSE 539 (HH) 09/28/2021 1120   BUN 10 09/28/2021 1120   CREATININE 0.73 09/28/2021 1120   CREATININE 1.42 (H) 09/08/2017 1540   CALCIUM 9.0 09/28/2021 1120   PROT 8.1 09/28/2021 1120   ALBUMIN 3.5 09/28/2021 1120   AST 26 09/28/2021 1120   ALT 33 09/28/2021 1120   ALKPHOS 291 (H) 09/28/2021 1120   BILITOT 0.7 09/28/2021 1120   GFRNONAA >60 09/28/2021 1120   GFRAA >60 03/17/2020 1200     Diabetic Labs (most recent): Lab Results  Component Value Date   HGBA1C >15.5 (H) 09/28/2021   HGBA1C >15.5 (H) 07/07/2021   HGBA1C 14.9 (A) 01/21/2021     Lipid Panel ( most recent) Lipid Panel     Component Value Date/Time   CHOL 264 (H) 09/28/2021 1119   TRIG 261 (H) 09/28/2021 1119   HDL 46 09/28/2021 1119   CHOLHDL 5.7 09/28/2021 1119   VLDL 52 (H) 09/28/2021 1119   LDLCALC 166  (H) 09/28/2021 1119      Lab Results  Component Value Date   TSH 0.532 06/28/2017       Assessment & Plan:   1) Uncontrolled type 2 diabetes mellitus with hyperglycemia (HCC)  - [name] has currently uncontrolled symptomatic type 2 DM since  56 years of age.  He presents today with his meter,  no logs, showing slowly improving glycemic profile yet still grossly above target.  He was not due for another A1c today.  I did send in for Trulicity to take place of his Januvia, he has not yet received it from the mail-order pharmacy so he continues to use the rest of the Januvia he has.  He admits he still misses his short acting insulin for various reasons, has a special needs son that takes up his time.  Analysis of his meter shows 7-day average of 468, 14-day average of 476, 30-day average of 511 with inconsistent readings.  -Recent labs reviewed.  - I had a long discussion with him about the progressive nature of diabetes and the pathology behind its complications. -his diabetes is complicated by obesity/sedentary life, inadequate insurance coverage,  And he remains at a high risk for more acute and chronic complications which include CAD, CVA, CKD, retinopathy, and neuropathy. These are all discussed in detail with him.  - Nutritional counseling repeated at each appointment due to patients tendency to fall back in to old habits.  - The patient admits there is a room for improvement in their diet and drink choices. -  Suggestion is made for the patient to avoid simple carbohydrates from their diet including Cakes, Sweet Desserts / Pastries, Ice Cream, Soda (diet and regular), Sweet Tea, Candies, Chips, Cookies, Sweet Pastries, Store Bought Juices, Alcohol in Excess of 1-2 drinks a day, Artificial Sweeteners, Coffee Creamer, and "Sugar-free" Products. This will help patient to have stable blood glucose profile and potentially avoid unintended weight gain.   - I encouraged the patient to  switch to unprocessed or minimally processed complex starch and increased protein intake (animal or plant source), fruits, and vegetables.   - Patient is advised to stick to a routine mealtimes to eat 3 meals a day and avoid unnecessary snacks (to snack only to correct hypoglycemia).  - he will be scheduled with Jearld Fenton, RDN, CDE for diabetes education.  - I have approached him with the following individualized plan to manage  his diabetes and patient agrees:   -He is advised to increase his Lantus at 70 units SQ nightly, continue his Apidra at 16-22 units TID with meals if glucose is above 90 and he is eating but to be more consistent with taking it (Specific instructions on how to titrate insulin dosage based on glucose readings given to patient in writing).  He can also continue Metformin 1000 mg ER PO daily with breakfast and Januvia 100 mg po daily with breakfast for now.  Once he obtains the Trulicity, he is advised to stop the Januvia and start the Trulicity 4.27 mg SQ weekly x 1 month, then advance to 1.5 mg SQ weekly thereafter.  He is encouraged consistently monitor blood glucose 4 times per day, before meals and at bedtime and report to the clinic if readings are less than 70 or greater than 300 for 3 tests in a row.  - Specific targets for  A1c;  LDL, HDL,  and Triglycerides were discussed with the patient.  2) Blood Pressure /Hypertension:   His blood pressure is controlled to target.  He is advised to continue Norvasc 5 mg po daily, Lasix 20 mg po daily, Lisinopril 20 mg po daily, and Metoprolol 100 mg po twice daily.    3) Lipids/Hyperlipidemia:   His recent lipid panel from 09/28/21 shows uncontrolled LDL of 166 and elevated triglycerides of 261.  He is advised to continue Atorvastatin 40 mg  po daily at bedtime.  Side effects and precautions discussed with him.    4)  Weight/Diet:  His Body mass index is 59.27 kg/m.-   clearly complicating his diabetes care.   he is  a  candidate for modest weight loss. I discussed with him the fact that loss of 5 - 10% of his  current body weight will have the most impact on his diabetes management.  Exercise, and detailed carbohydrates information provided  -  detailed on discharge instructions.  He is a good candidate for bariatric surgery, however at this point he has no insurance.  5) Chronic Care/Health Maintenance: -he is on ACEI/ARB and Statin medications and is encouraged to initiate and continue to follow up with Ophthalmology, Dentist,  Podiatrist at least yearly or according to recommendations, and advised to stay away from smoking. I have recommended yearly flu vaccine and pneumonia vaccine at least every 5 years; moderate intensity exercise for up to 150 minutes weekly; and  sleep for at least 7 hours a day.  - he is advised to maintain close follow up with [name], PA-C for primary care needs, as well as his other providers for optimal and coordinated care.      I spent 42 minutes in the care of the patient today including review of labs from [place], Lipids, Thyroid Function, Hematology (current and previous including abstractions from other facilities); face-to-face time discussing  his blood glucose readings/logs, discussing hypoglycemia and hyperglycemia episodes and symptoms, medications doses, his options of short and long term treatment based on the latest standards of care / guidelines;  discussion about incorporating lifestyle medicine;  and documenting the encounter.    Please refer to Patient Instructions for Blood Glucose Monitoring and Insulin/Medications Dosing Guide"  in media tab for additional information. Please  also refer to " Patient Self Inventory" in the Media  tab for reviewed elements of pertinent patient history.  [name] participated in the discussions, expressed understanding, and voiced agreement with the above plans.  All questions were answered to his satisfaction. he is  encouraged to contact clinic should he have any questions or concerns prior to his return visit.   Follow up plan: - Return in about 1 month (around 11/17/2021) for Diabetes F/U, Bring meter and logs.  Rayetta Pigg, [facility] [address] [place], [place] [address] Phone: [phone] Fax: [phone]  10/20/2021, 3:29 PM

## 2021-10-22 ENCOUNTER — Other Ambulatory Visit: Payer: Self-pay

## 2021-10-22 ENCOUNTER — Ambulatory Visit (HOSPITAL_COMMUNITY)
Admission: RE | Admit: 2021-10-22 | Discharge: 2021-10-22 | Disposition: A | Discharge status: Home / Self Care | Payer: Self-pay | Source: Ambulatory Visit | Attending: Physician Assistant | Admitting: Physician Assistant

## 2021-10-22 DIAGNOSIS — R748 Abnormal levels of other serum enzymes: Secondary | ICD-10-CM | POA: Insufficient documentation

## 2021-10-22 DIAGNOSIS — K750 Abscess of liver: Secondary | ICD-10-CM | POA: Insufficient documentation

## 2021-10-22 MED ORDER — IOHEXOL 300 MG/ML  SOLN
100.0000 mL | Freq: Once | INTRAMUSCULAR | Status: AC | PRN
  Administered 2021-10-22: 100 mL via INTRAVENOUS

## 2021-10-26 ENCOUNTER — Ambulatory Visit: Payer: Self-pay | Admitting: Physician Assistant

## 2021-10-27 ENCOUNTER — Other Ambulatory Visit: Payer: Self-pay

## 2021-10-27 ENCOUNTER — Ambulatory Visit: Payer: Self-pay | Admitting: Physician Assistant

## 2021-10-27 ENCOUNTER — Encounter: Payer: Self-pay | Admitting: Physician Assistant

## 2021-10-27 VITALS — BP 118/80 | HR 102 | Temp 97.9°F | Wt 370.0 lb

## 2021-10-27 DIAGNOSIS — L089 Local infection of the skin and subcutaneous tissue, unspecified: Secondary | ICD-10-CM

## 2021-10-27 DIAGNOSIS — E1165 Type 2 diabetes mellitus with hyperglycemia: Secondary | ICD-10-CM

## 2021-10-27 DIAGNOSIS — I878 Other specified disorders of veins: Secondary | ICD-10-CM

## 2021-10-27 DIAGNOSIS — T148XXA Other injury of unspecified body region, initial encounter: Secondary | ICD-10-CM

## 2021-10-27 NOTE — Progress Notes (Signed)
° °  BP 118/80    Pulse (!) 102    Temp 97.9 F (36.6 C)    Wt (!) 370 lb (167.8 kg)    SpO2 97%    BMI 57.95 kg/m    Subjective:    Patient ID: [name], male    DOB: 09-09-1965, 56 y.o.   MRN: [id number]  HPI: [name] is a 56 y.o. male presenting on 10/27/2021 for Wound Check   HPI  Chief Complaint  Patient presents with   Wound Check    Pt is in today for recheck on wound RLE.  He was seen last on 10/13/21 at which time he was given another course of keflex.  He has been having wounds since at least august 2022.    He says he is working on controlling his DM.     Relevant past medical, surgical, family and social history reviewed and updated as indicated. Interim medical history since our last visit reviewed. Allergies and medications reviewed and updated.  Review of Systems  Per HPI unless specifically indicated above     Objective:    BP 118/80    Pulse (!) 102    Temp 97.9 F (36.6 C)    Wt (!) 370 lb (167.8 kg)    SpO2 97%    BMI 57.95 kg/m   Wt Readings from Last 3 Encounters:  10/27/21 (!) 370 lb (167.8 kg)  10/20/21 (!) 378 lb 6.4 oz (171.6 kg)  10/15/21 (!) 373 lb (169.2 kg)    Physical Exam Constitutional:      General: He is not in acute distress.    Appearance: He is obese. He is not ill-appearing.  HENT:     Head: Normocephalic and atraumatic.  Pulmonary:     Effort: No respiratory distress.  Musculoskeletal:     Comments: Scabs BLE.  RLE with open wound.  There is no drainage or abscess.  Neurological:     Mental Status: He is alert and oriented to person, place, and time.  Psychiatric:        Attention and Perception: Attention normal.        Behavior: Behavior is cooperative.          Assessment & Plan:   Encounter Diagnoses  Name Primary?   Wound, open Yes   Wound infection    Venous stasis    Morbid obesity ([place])    Uncontrolled type 2 diabetes mellitus with hyperglycemia ([place])       -pt to Call [facility] (to speak  with Diane) about [facility] (as his urologist referred him to urology at [facility]).  -Refer to wound clinic.  Concern for nonhealing wound in uncontrolled diabetic.  He has current financial assistance with [facility]  -pt is urged to manage his diabetes  -reviewed results abd CT with pt  -pt to follow up here 1 month.  He is to contact office right away for any worsening

## 2021-10-29 ENCOUNTER — Other Ambulatory Visit: Payer: Self-pay

## 2021-10-29 ENCOUNTER — Encounter: Payer: Self-pay | Admitting: Orthopaedic Surgery

## 2021-10-29 ENCOUNTER — Ambulatory Visit (INDEPENDENT_AMBULATORY_CARE_PROVIDER_SITE_OTHER): Payer: Self-pay | Admitting: Orthopaedic Surgery

## 2021-10-29 VITALS — BP 145/90 | HR 115 | Ht 68.0 in | Wt 363.0 lb

## 2021-10-29 DIAGNOSIS — Z6841 Body Mass Index (BMI) 40.0 and over, adult: Secondary | ICD-10-CM

## 2021-10-29 DIAGNOSIS — G8929 Other chronic pain: Secondary | ICD-10-CM

## 2021-10-29 DIAGNOSIS — M25561 Pain in right knee: Secondary | ICD-10-CM

## 2021-10-29 DIAGNOSIS — M25562 Pain in left knee: Secondary | ICD-10-CM

## 2021-10-29 DIAGNOSIS — M1A09X0 Idiopathic chronic gout, multiple sites, without tophus (tophi): Secondary | ICD-10-CM

## 2021-10-29 MED ORDER — OXYCODONE-ACETAMINOPHEN 10-325 MG PO TABS: 1.0000 | ORAL_TABLET | ORAL | 0 refills | Status: DC | PRN

## 2021-10-29 NOTE — Progress Notes (Signed)
My knees are tender.  He has chronic pain of both knees with swelling and popping but no giving way.  He has no new trauma.  He is not very active.  He has lost only a few pounds.  ROM of right knee is 0 to 100, left 0 to 105, limp right, effusion both knees, crepitus both knees, knees stable, NV intact, no distal edema.  Encounter Diagnoses  Name Primary?   Chronic pain of right knee Yes   Chronic pain of left knee    Morbid obesity due to excess calories ([facility])    Body mass index 60.0-69.9, adult ([facility])    Idiopathic chronic gout of multiple sites without tophus    I have reviewed the [place] web site prior to prescribing narcotic medicine for this patient.  Return in three months.  Call if any problem.  Precautions discussed.  Electronically Signed Sanjuana Kava, MD 2/23/20239:39 AM

## 2021-11-03 ENCOUNTER — Ambulatory Visit: Payer: Self-pay | Admitting: Orthopaedic Surgery

## 2021-11-05 ENCOUNTER — Telehealth: Payer: Self-pay | Admitting: Physician Assistant

## 2021-11-05 NOTE — Telephone Encounter (Signed)
Pt was called in reference referral place 10/27/21 to wound center.  That office has tried to contact pt several times but he has not yet scheduled.  Pt was called today and urged to make appointment with wound center.  Discussed importance including possible LE amputation if wound progresses in light of his uncontrolled DM,  he says he will call and schedule appointment; he has the phone number.  ?

## 2021-11-18 ENCOUNTER — Other Ambulatory Visit: Payer: Self-pay

## 2021-11-18 ENCOUNTER — Encounter: Payer: Self-pay | Admitting: Nurse Practitioner

## 2021-11-18 ENCOUNTER — Ambulatory Visit (INDEPENDENT_AMBULATORY_CARE_PROVIDER_SITE_OTHER): Payer: Self-pay | Admitting: Nurse Practitioner

## 2021-11-18 VITALS — BP 119/78 | HR 111 | Ht 68.0 in | Wt 362.0 lb

## 2021-11-18 DIAGNOSIS — I10 Essential (primary) hypertension: Secondary | ICD-10-CM

## 2021-11-18 DIAGNOSIS — E782 Mixed hyperlipidemia: Secondary | ICD-10-CM

## 2021-11-18 DIAGNOSIS — E1165 Type 2 diabetes mellitus with hyperglycemia: Secondary | ICD-10-CM

## 2021-11-18 MED ORDER — GLIPIZIDE ER 5 MG PO TB24: 5.0000 mg | ORAL_TABLET | Freq: Every day | ORAL | 3 refills | Status: DC

## 2021-11-18 MED ORDER — INSULIN GLARGINE 100 UNIT/ML ~~LOC~~ SOLN: 80.0000 [IU] | Freq: Every day | SUBCUTANEOUS | 3 refills | Status: DC

## 2021-11-18 NOTE — Patient Instructions (Signed)
Diabetes Mellitus Emergency Preparedness Plan ?A diabetes emergency preparedness plan is a checklist to make sure you have everything you need to manage your diabetes in case of an emergency, such as an evacuation, natural disaster, national security emergency, or pandemic lockdown. ?Managing your diabetes is something you have to do all day every day. The American Diabetes Association and the American College of Endocrinology both recommend putting together an emergency diabetes kit. Your kit should include important information and documents as well as all the supplies you will need to manage your diabetes for at least 1 week. Store it in a portable, waterproof bag or container. The best time to start making your emergency kit is now. ?How to make your emergency kit ?Collect information and documents ?Include the following information and documents in your kit: ?The type of diabetes you have. ?A copy of your health insurance cards and photo ID. ?A list of all your other medical conditions, allergies, and surgeries. ?A list of all your medicines and doses with the contact information for your pharmacy. Ask your health care provider for a list of your current medicines. ?Any recent lab results, including your latest hemoglobin A1C (HbA1C). ?The make, model, and serial number of your insulin pump, if you use one. Also include contact information for the manufacturer. ?Contact information for people who should be notified in case of an emergency. Include your health care provider's name, address, and phone number. ?Collect diabetes care items ?Include the following diabetes care items in your kit: ?At least a 1-week supply of: ?Oral medicines. ?Insulin. ?Blood glucose testing supplies. These include testing strips, lancets, and extra batteries for your blood glucose monitor and pump. ?A charger for the continuous glucose monitor (CGM) receiver and pump. ?Any extra supplies needed for your CGM or pump. ?A supply of  glucagon, glucose tablets, juice, soda, or hard candy in case of hypoglycemia. ?Coolers or cold packs. ?A safe container for syringes, needles, and lancets. ? ?Other preparations ?Other things to consider doing as part of your emergency plan: ?Make sure that your mobile phone is charged and that you have an extra charger, cable, or batteries. ?Choose a meeting place for family members. ?Wear a medical alert or ID bracelet. ?If you have a child with diabetes, make sure your child's school has a copy of his or her emergency plan, including the name of the staff member who will assist your child. ?Where to find more information ?American Diabetes Association: www.diabetes.org ?Centers for Disease Control and Prevention: blogs.cdc.gov ?Summary ?A diabetes emergency preparedness plan is a checklist to make sure you have everything you need in case of an emergency. ?Your kit should include important information and documents as well as all the supplies you will need to manage your condition for at least 1 week. ?Store your kit in a portable, waterproof bag or container. ?The best time to start making your emergency kit is now. ?This information is not intended to replace advice given to you by your health care provider. Make sure you discuss any questions you have with your health care provider. ?Document Revised: 02/28/2020 Document Reviewed: 02/28/2020 ?Elsevier Patient Education ? 2022 Elsevier Inc. ? ?

## 2021-11-18 NOTE — Progress Notes (Signed)
? ?                                            ?     11/18/2021, 2:48 PM ? ?    Endocrinology Follow Up Visit ? ?Subjective:  ? ? Patient ID: [name], male    DOB: [birth year].  ?[name] is being seen in follow up for management of currently uncontrolled symptomatic diabetes requested by  [name], PA-C. ? ? ?Past Medical History:  ?Diagnosis Date  ? Arthritis   ? Diabetes mellitus without complication ([place])   ? diagnosed about age 84  ? Gout   ? Hypertension   ? board[place]line  ? ? ?Past Surgical History:  ?Procedure Laterality Date  ? HERNIA REPAIR  2008  ? MASS EXCISION Left age 46  ? L thigh- benign  ? ? ?Social History  ? ?Socioeconomic History  ? Marital status: Single  ?  Spouse name: Not on file  ? Number of children: Not on file  ? Years of education: Not on file  ? Highest education level: Not on file  ?Occupational History  ? Not on file  ?Tobacco Use  ? Smoking status: Former  ?  Packs/day: 0.50  ?  Years: 5.00  ?  Pack years: 2.50  ?  Types: Cigarettes  ?  Quit date: 37  ?  Years since quitting: 33.2  ? Smokeless tobacco: Never  ?Vaping Use  ? Vaping Use: Never used  ?Substance and Sexual Activity  ? Alcohol use: No  ? Drug use: No  ? Sexual activity: Not on file  ?Other Topics Concern  ? Not on file  ?Social History Narrative  ? Not on file  ? ?Social Determinants of Health  ? ?Financial Resource Strain: Not on file  ?Food Insecurity: Not on file  ?Transportation Needs: Not on file  ?Physical Activity: Not on file  ?Stress: Not on file  ?Social Connections: Not on file  ? ? ?Family History  ?Problem Relation Age of Onset  ? Cancer Mother   ? Diabetes Mother   ? Heart failure Father   ? Hypertension Father   ? Diabetes Father   ? Heart attack Father   ? ? ?Outpatient Encounter Medications as of 11/18/2021  ?Medication Sig  ? allopurinol (ZYLOPRIM) 300 MG tablet Take 1 tablet (300 mg total) by mouth daily.  ? amLODipine (NORVASC) 5 MG tablet Take 1 tablet (5 mg total) by  mouth daily.  ? atorvastatin (LIPITOR) 40 MG tablet Take 1 tablet (40 mg total) by mouth daily.  ? cyclobenzaprine (FLEXERIL) 10 MG tablet Take 1 tablet (10 mg total) by mouth 3 (three) times daily as needed for muscle spasms.  ? Dulaglutide (TRULICITY) 1.61 WR/6.0AV SOPN Inject 0.75 mg into the skin once a week.  ? furosemide (LASIX) 20 MG tablet Take 1 tablet (20 mg total) by mouth daily.  ? glipiZIDE (GLUCOTROL XL) 5 MG 24 hr tablet Take 1 tablet (5 mg total) by mouth daily with breakfast.  ? Insulin Glulisine (APIDRA SOLOSTAR [place]) Inject 16-22 Units into the skin in the morning, at noon, and at bedtime.  ? lisinopril (ZESTRIL) 20 MG tablet Take 1 tablet (20 mg total) by mouth daily.  ? metFORMIN (GLUCOPHAGE XR) 500 MG 24 hr tablet Take 2 tablets (1,000 mg total) by mouth daily with breakfast.  ?  metoprolol tartrate (LOPRESSOR) 100 MG tablet Take 1 tablet (100 mg total) by mouth 2 (two) times daily.  ? nystatin cream (MYCOSTATIN) Apply 1 application topically 2 (two) times daily. To penile irritation.  ? oxyCODONE-acetaminophen (PERCOCET) 10-325 MG tablet Take 1 tablet by mouth every 4 (four) hours as needed for pain.  ? [DISCONTINUED] insulin glargine (LANTUS) 100 UNIT/ML injection Inject 0.6 mLs (60 Units total) into the skin at bedtime. (Patient taking differently: Inject 70 Units into the skin at bedtime.)  ? Dulaglutide (TRULICITY) 1.5 DT/2.6ZT SOPN Inject 1.5 mg into the skin once a week. (Patient not taking: Reported on 11/18/2021)  ? insulin glargine (LANTUS) 100 UNIT/ML injection Inject 0.8 mLs (80 Units total) into the skin at bedtime.  ? [DISCONTINUED] cephALEXin (KEFLEX) 500 MG capsule Take 1 capsule (500 mg total) by mouth 4 (four) times daily. (Patient not taking: Reported on 11/18/2021)  ? ?No facility-administered encounter medications on file as of 11/18/2021.  ? ? ?ALLERGIES: ?No Known Allergies ? ?VACCINATION STATUS: ?Immunization History  ?Administered Date(s) Administered  ? Influenza,inj,Quad  PF,6+ Mos 06/30/2017  ? Moderna Sars-Covid-2 Vaccination 04/01/2020, 04/29/2020  ? ? ?Diabetes ?He presents for his follow-up diabetic visit. He has type 2 diabetes mellitus. Onset time: He was diagnosed at approximate age of 39 years. His disease course has been worsening. There are no hypoglycemic associated symptoms. Pertinent negatives for hypoglycemia include no confusion, headaches, pallor or seizures. Associated symptoms include blurred vision, fatigue, polydipsia, polyphagia, polyuria and weight loss. Pertinent negatives for diabetes include no chest pain and no weakness. There are no hypoglycemic complications. Symptoms are stable. There are no diabetic complications. Risk factors for coronary artery disease include dyslipidemia, diabetes mellitus, hypertension, male sex, obesity and sedentary lifestyle. Current diabetic treatment includes intensive insulin program and oral agent (dual therapy). He is compliant with treatment most of the time (still misses opportunities to inject insulin). His weight is fluctuating minimally. He is following a generally unhealthy diet. When asked about meal planning, he reported none. He has not had a previous visit with a dietitian. He never participates in exercise. His home blood glucose trend is increasing steadily. His overall blood glucose range is >200 mg/dl. (He presents today with his meter, no logs, showing significantly elevated glycemic profile overall.  His 7-day average was 584, 14-day average of 589, 30-day average 582.  He was not due for another A1c today.  He is struggling with his addiction to food (sugar in particular).  He finds himself binge eating frequently.  He has been speaking with a counselor about this.) An ACE inhibitor/angiotensin II receptor blocker is being taken. He does not see a podiatrist.Eye exam is current.  ?Hyperlipidemia ?This is a chronic problem. The current episode started more than 1 year ago. The problem is uncontrolled. Recent  lipid tests were reviewed and are high. Exacerbating diseases include diabetes and obesity. Factors aggravating his hyperlipidemia include beta blockers and fatty foods. Pertinent negatives include no chest pain, myalgias or shortness of breath. Current antihyperlipidemic treatment includes statins. The current treatment provides moderate improvement of lipids. Compliance problems include adherence to diet, adherence to exercise and medication cost.  Risk factors for coronary artery disease include dyslipidemia, diabetes mellitus, family history, hypertension, male sex, obesity and a sedentary lifestyle.  ?Hypertension ?This is a chronic problem. The current episode started more than 1 year ago. The problem has been resolved since onset. The problem is controlled. Associated symptoms include blurred vision. Pertinent negatives include no chest pain, headaches, malaise/fatigue,  neck pain, palpitations or shortness of breath. There are no associated agents to hypertension. Risk factors for coronary artery disease include dyslipidemia, diabetes mellitus, male gender, obesity and sedentary lifestyle. Past treatments include ACE inhibitors, beta blockers, calcium channel blockers and diuretics. The current treatment provides mild improvement. Compliance problems include medication cost, diet and exercise.   ? ?Review of systems ? ?Constitutional: + stable body weight,  current Body mass index is 55.04 kg/m?., + fatigue, no subjective hyperthermia, no subjective hypothermia ?Eyes: + blurry vision, no xerophthalmia ?ENT: no sore throat, no nodules palpated in throat, no dysphagia/odynophagia, no hoarseness ?Cardiovascular: no chest pain, no shortness of breath, no palpitations, no leg swelling ?Respiratory: no cough, no shortness of breath ?Gastrointestinal: no nausea/vomiting/diarrhea ?Genitourinary: + polyuria ?Musculoskeletal: + generalized muscle/joint aches ?Skin: no rashes, no hyperemia, currently on antibiotics for  infection in his leg which caused his leg cramps ?Neurological: no tremors, no numbness, no tingling, no dizziness ?Psychiatric: no depression, no anxiety ? ? ? ?Objective:  ?  ?BP 119/78   Pulse (!) 111

## 2021-11-20 ENCOUNTER — Encounter: Payer: Self-pay | Attending: Physician Assistant | Admitting: Physician Assistant

## 2021-11-20 ENCOUNTER — Other Ambulatory Visit: Payer: Self-pay

## 2021-11-20 DIAGNOSIS — Z87891 Personal history of nicotine dependence: Secondary | ICD-10-CM | POA: Insufficient documentation

## 2021-11-20 DIAGNOSIS — Z6841 Body Mass Index (BMI) 40.0 and over, adult: Secondary | ICD-10-CM | POA: Insufficient documentation

## 2021-11-20 DIAGNOSIS — E11622 Type 2 diabetes mellitus with other skin ulcer: Secondary | ICD-10-CM | POA: Insufficient documentation

## 2021-11-20 DIAGNOSIS — L97812 Non-pressure chronic ulcer of other part of right lower leg with fat layer exposed: Secondary | ICD-10-CM | POA: Insufficient documentation

## 2021-11-20 DIAGNOSIS — L97822 Non-pressure chronic ulcer of other part of left lower leg with fat layer exposed: Secondary | ICD-10-CM | POA: Insufficient documentation

## 2021-11-20 DIAGNOSIS — M25561 Pain in right knee: Secondary | ICD-10-CM | POA: Insufficient documentation

## 2021-11-20 DIAGNOSIS — G8929 Other chronic pain: Secondary | ICD-10-CM | POA: Insufficient documentation

## 2021-11-20 DIAGNOSIS — I10 Essential (primary) hypertension: Secondary | ICD-10-CM | POA: Insufficient documentation

## 2021-11-20 DIAGNOSIS — M25562 Pain in left knee: Secondary | ICD-10-CM | POA: Insufficient documentation

## 2021-11-20 NOTE — Progress Notes (Addendum)
CASMER, YEPIZ ([id number]) ?Visit Report for 11/20/2021 ?Chief Complaint Document Details ?Patient Name: [name] ?Date of Service: 11/20/2021 12:45 PM ?Medical Record Number: [id number] ?Patient Account Number: [id number] ?Date of Birth/Sex: 08-Jun-1966 (56 y.o. M) ?Treating RN: Levora Dredge ?Primary Care Provider: [name] Other Clinician: ?Referring Provider: [name] ?Treating Provider/Extender: Jeri Cos ?Weeks in Treatment: 0 ?Information Obtained from: Patient ?Chief Complaint ?Bilateral lower leg ulcers ?Electronic Signature(s) ?Signed: 11/20/2021 1:46:01 PM By: Worthy Keeler PA-C ?Entered By: Worthy Keeler on 11/20/2021 13:46:01 ?[name], [name] ([id number]) ?-------------------------------------------------------------------------------- ?Debridement Details ?Patient Name: [name] ?Date of Service: 11/20/2021 12:45 PM ?Medical Record Number: [id number] ?Patient Account Number: [id number] ?Date of Birth/Sex: 14-May-1966 (56 y.o. M) ?Treating RN: Levora Dredge ?Primary Care Provider: [name] Other Clinician: ?Referring Provider: [name] ?Treating Provider/Extender: Jeri Cos ?Weeks in Treatment: 0 ?Debridement Performed for ?Wound #2 Right,Lateral Lower Leg ?Assessment: ?Performed By: Physician Tommie Sams., PA-C ?Debridement Type: Debridement ?Severity of Tissue Pre Debridement: Fat layer exposed ?Level of Consciousness (Pre- ?Awake and Alert ?procedure): ?Pre-procedure Verification/Time Out ?Yes - 13:50 ?Taken: ?Total Area Debrided (L x W): 3.5 (cm) x 1.5 (cm) = 5.25 (cm?) ?Tissue and other material ?Non-Viable, Skin: Dermis , Skin: Epidermis ?debrided: ?Level: Skin/Epidermis ?Debridement Description: Selective/Open Wound ?Instrument: Curette ?Bleeding: Minimum ?Hemostasis Achieved: Pressure ?Response to Treatment: Procedure was tolerated well ?Level of Consciousness (Post- ?Awake and Alert ?procedure): ?Post Debridement Measurements of Total Wound ?Length: (cm)  3.5 ?Width: (cm) 1.5 ?Depth: (cm) 0.1 ?Volume: (cm?) 0.412 ?Character of Wound/Ulcer Post Debridement: Stable ?Severity of Tissue Post Debridement: Fat layer exposed ?Post Procedure Diagnosis ?Same as Pre-procedure ?Electronic Signature(s) ?Signed: 11/20/2021 4:12:12 PM By: Levora Dredge ?Signed: 11/20/2021 5:45:40 PM By: Worthy Keeler PA-C ?Entered By: Levora Dredge on 11/20/2021 13:57:07 ?[name], [name] ([id number]) ?-------------------------------------------------------------------------------- ?Debridement Details ?Patient Name: [name] ?Date of Service: 11/20/2021 12:45 PM ?Medical Record Number: [id number] ?Patient Account Number: [id number] ?Date of Birth/Sex: Feb 01, 1966 (56 y.o. M) ?Treating RN: Levora Dredge ?Primary Care Provider: [name] Other Clinician: ?Referring Provider: [name] ?Treating Provider/Extender: Jeri Cos ?Weeks in Treatment: 0 ?Debridement Performed for ?Wound #3 Right,Medial Lower Leg ?Assessment: ?Performed By: Physician Tommie Sams., PA-C ?Debridement Type: Debridement ?Severity of Tissue Pre Debridement: Fat layer exposed ?Level of Consciousness (Pre- ?Awake and Alert ?procedure): ?Pre-procedure Verification/Time Out ?Yes - 13:50 ?Taken: ?Total Area Debrided (L x W): 3.2 (cm) x 5 (cm) = 16 (cm?) ?Tissue and other material ?Non-Viable, Slough, Skin: Dermis , Skin: Epidermis, Slough ?debrided: ?Level: Skin/Epidermis ?Debridement Description: Selective/Open Wound ?Instrument: Curette ?Bleeding: Moderate ?Hemostasis Achieved: Pressure ?Response to Treatment: Procedure was tolerated well ?Level of Consciousness (Post- ?Awake and Alert ?procedure): ?Post Debridement Measurements of Total Wound ?Length: (cm) 3.2 ?Width: (cm) 5 ?Depth: (cm) 0.2 ?Volume: (cm?) 2.513 ?Character of Wound/Ulcer Post Debridement: Stable ?Severity of Tissue Post Debridement: Fat layer exposed ?Post Procedure Diagnosis ?Same as Pre-procedure ?Electronic Signature(s) ?Signed: 11/20/2021  4:12:12 PM By: Levora Dredge ?Signed: 11/20/2021 5:45:40 PM By: Worthy Keeler PA-C ?Entered By: Levora Dredge on 11/20/2021 14:01:13 ?[name], [name] ([id number]) ?-------------------------------------------------------------------------------- ?Debridement Details ?Patient Name: [name] ?Date of Service: 11/20/2021 12:45 PM ?Medical Record Number: [id number] ?Patient Account Number: [id number] ?Date of Birth/Sex: Mar 26, 1966 (56 y.o. M) ?Treating RN: Levora Dredge ?Primary Care Provider: [name] Other Clinician: ?Referring Provider: [name] ?Treating Provider/Extender: Jeri Cos ?Weeks in Treatment: 0 ?Debridement Performed for ?Wound #4 Left Lower Leg ?Assessment: ?Performed By: Physician Tommie Sams., PA-C ?Debridement Type: Debridement ?Severity of Tissue  Pre Debridement: Fat layer exposed ?Level of Consciousness (Pre- ?Awake and Alert ?procedure): ?Pre-procedure Verification/Time Out ?Yes - 13:50 ?Taken: ?Total Area Debrided (L x W): 4.3 (cm) x 2.5 (cm) = 10.75 (cm?) ?Tissue and other material ?Non-Viable, Skin: Dermis , Skin: Epidermis ?debrided: ?Level: Skin/Epidermis ?Debridement Description: Selective/Open Wound ?Instrument: Curette ?Bleeding: Moderate ?Hemostasis Achieved: Pressure ?Response to Treatment: Procedure was tolerated well ?Level of Consciousness (Post- ?Awake and Alert ?procedure): ?Post Debridement Measurements of Total Wound ?Length: (cm) 4.3 ?Width: (cm) 2 ?Depth: (cm) 0.1 ?Volume: (cm?) 0.675 ?Character of Wound/Ulcer Post Debridement: Stable ?Severity of Tissue Post Debridement: Fat layer exposed ?Post Procedure Diagnosis ?Same as Pre-procedure ?Electronic Signature(s) ?Signed: 11/20/2021 4:12:12 PM By: Levora Dredge ?Signed: 11/20/2021 5:45:40 PM By: Worthy Keeler PA-C ?Entered By: Levora Dredge on 11/20/2021 14:03:03 ?[name], [name] ([id number]) ?-------------------------------------------------------------------------------- ?HPI Details ?Patient Name: [name] ?Date of Service: 11/20/2021 12:45 PM ?Medical Record Number: [id number] ?Patient Account Number: [id number] ?Date of Birth/Sex: 29-May-1966 (56 y.o. M) ?Treating RN: Levora Dredge ?Primary Care Provider: [name] Other Clinician: ?Referring Provider: [name] ?Treating Provider/Extender: Jeri Cos ?Weeks in Treatment: 0 ?History of Present Illness ?HPI Description: 56 year old male with past medical history of diabetes type 2, HTN, obesity, former smoker, and chronic narcotic use. Present ?today for right lower leg ulcer. He states that he first noticed wound back in October 2019. At that time he felt that he was bitten by something. ?There was no OTC treatment. Over time the wound became larger with increased pain. He was seen by [facility] urgent [facility] and started on ?Keflex antibiotics for cellulitis for 14 days. Finished abx 2-3 days ago. Chronic bilateral knee pain. HA1C in November 2019 was 9.6. He is ?independent of ADLs however, he is unable to reach the right leg wound area. Left ABI in clinic is 0.94, right is non-compressible. ?09/11/17 on evaluation today patient has a wound over the right lower extremity which appears to be extremely painful for him. He states at this ?time that the pain is a 10 out of 10 currently. Nonetheless there does not appear to be any signs of infection. He still does not have any insurance ?at this point and therefore a vascular evaluation is not possible he tells me. Obviously I do think that with his right lower extremity be ?noncompressible we really need to have vascular evaluation order to consider any type of wrap or compressive therapy. ?09/19/18 on evaluation today patient appears to be doing a little better in regard to his right lateral lower [place] ulcer. He has been tolerating the ?dressing changes without complication. Fortunately there is no sign of infection at this time. He has been tolerating the dressing changes without ?complication. He  does have significant fluid buildup of the right lower extremity but at this point no wraps are being performed as he was ?noncompressible during initial evaluation. He also has not wanted [place] to proceed w

## 2021-11-20 NOTE — Progress Notes (Signed)
KHAYRI, KARGBO ([id number]) ?Visit Report for 11/20/2021 ?Abuse Risk Screen Details ?Patient Name: [name] ?Date of Service: 11/20/2021 12:45 PM ?Medical Record Number: [id number] ?Patient Account Number: [id number] ?Date of Birth/Sex: [birth year] (56 y.o. Male) ?Treating RN: Levora Dredge ?Primary Care Zylie Mumaw: [name] Other Clinician: ?Referring Ozan Maclay: [name] ?Treating Kamala Kolton/Extender: Jeri Cos ?Weeks in Treatment: 0 ?Abuse Risk Screen Items ?Answer ?ABUSE RISK SCREEN: ?Has anyone close to you tried to hurt or harm you recentlyo No ?Do you feel uncomfortable with anyone in your familyo No ?Has anyone forced you do things that you didnot want to doo No ?Electronic Signature(s) ?Signed: 11/20/2021 4:12:12 PM By: Levora Dredge ?Entered By: Levora Dredge on 11/20/2021 12:59:20 ?[name], [name] ([id number]) ?-------------------------------------------------------------------------------- ?Activities of Daily Living Details ?Patient Name: [name] ?Date of Service: 11/20/2021 12:45 PM ?Medical Record Number: [id number] ?Patient Account Number: [id number] ?Date of Birth/Sex: [birth year] (56 y.o. Male) ?Treating RN: Levora Dredge ?Primary Care Kathia Covington: [name] Other Clinician: ?Referring Maylin Freeburg: [name] ?Treating Delshon Blanchfield/Extender: Jeri Cos ?Weeks in Treatment: 0 ?Activities of Daily Living Items ?Answer ?Activities of Daily Living (Please select one for each item) ?[place] ?Take Medications Completely Able ?Use Telephone Completely Able ?Care for Appearance Completely Able ?Use Toilet Completely Able ?Bath / Shower Completely Able ?Dress Self Completely Able ?Feed Self Completely Able ?Walk Completely Able ?Get In / Out Bed Completely Able ?Housework Completely Able ?Prepare Meals Completely Able ?Handle Money Completely Able ?Shop for Self Completely Able ?Electronic Signature(s) ?Signed: 11/20/2021 4:12:12 PM By: Levora Dredge ?Entered By:  Levora Dredge on 11/20/2021 12:59:42 ?[name], [name] ([id number]) ?-------------------------------------------------------------------------------- ?Education Screening Details ?Patient Name: [name] ?Date of Service: 11/20/2021 12:45 PM ?Medical Record Number: [id number] ?Patient Account Number: [id number] ?Date of Birth/Sex: [birth year] (56 y.o. Male) ?Treating RN: Levora Dredge ?Primary Care Emelie Newsom: [name] Other Clinician: ?Referring Kaylei Frink: [name] ?Treating Maryrose Colvin/Extender: Jeri Cos ?Weeks in Treatment: 0 ?Learning Preferences/Education Level/Primary Language ?Learning Preference: Explanation, Demonstration, Video, Communication Board, Printed Material ?Highest Education Level: College or Above ?Preferred Language: English ?Cognitive Barrier ?Language Barrier: No ?Translator Needed: No ?Memory Deficit: No ?Emotional Barrier: No ?Physical Barrier ?Impaired Vision: No ?Impaired Hearing: No ?Decreased Hand dexterity: No ?Knowledge/Comprehension ?Knowledge Level: High ?Comprehension Level: High ?Ability to understand written instructions: High ?Ability to understand verbal instructions: High ?Motivation ?Anxiety Level: Calm ?Cooperation: Cooperative ?Education Importance: Acknowledges Need ?Interest in Health Problems: Asks Questions ?Perception: Coherent ?Willingness to Engage in Self-Management ?High ?Activities: ?Readiness to Engage in Self-Management ?High ?Activities: ?Electronic Signature(s) ?Signed: 11/20/2021 4:12:12 PM By: Levora Dredge ?Entered By: Levora Dredge on 11/20/2021 13:00:04 ?[name], [name] ([id number]) ?-------------------------------------------------------------------------------- ?Fall Risk Assessment Details ?Patient Name: [name] ?Date of Service: 11/20/2021 12:45 PM ?Medical Record Number: [id number] ?Patient Account Number: [id number] ?Date of Birth/Sex: [birth year] (56 y.o. Male) ?Treating RN: Levora Dredge ?Primary Care Inola Lisle: [name]  Other Clinician: ?Referring Aleiyah Halpin: [name] ?Treating Bernardino Dowell/Extender: Jeri Cos ?Weeks in Treatment: 0 ?Fall Risk Assessment Items ?Have you had 2 or more falls in the last 12 monthso 0 No ?Have you had any fall that resulted in injury in the last 12 monthso 0 No ?FALLS RISK SCREEN ?History of falling - immediate or within 3 months 0 No ?Secondary diagnosis (Do you have 2 or more medical diagnoseso) 0 No ?Ambulatory aid ?None/bed rest/wheelchair/nurse 0 Yes ?Crutches/cane/walker 0 No ?Furniture 0 No ?Intravenous therapy Access/Saline/Heparin Lock 0 No ?Gait/Transferring ?Normal/ bed rest/ wheelchair 0 Yes ?Weak (short steps with or without shuffle, stooped but able to  lift head while walking, may ?0 No ?seek support from furniture) ?Impaired (short steps with shuffle, may have difficulty arising from chair, head down, impaired ?0 No ?balance) ?Mental Status ?Oriented to own ability 0 Yes ?Electronic Signature(s) ?Signed: 11/20/2021 4:12:12 PM By: Levora Dredge ?Entered By: Levora Dredge on 11/20/2021 13:00:18 ?DANIYAL, [name] ([id number]) ?-------------------------------------------------------------------------------- ?Foot Assessment Details ?Patient Name: [name] ?Date of Service: 11/20/2021 12:45 PM ?Medical Record Number: [id number] ?Patient Account Number: [id number] ?Date of Birth/Sex: 13-Feb-1966 (56 y.o. Male) ?Treating RN: Levora Dredge ?Primary Care Caera Enwright: [name] Other Clinician: ?Referring Shavawn Stobaugh: [name] ?Treating Thirza Pellicano/Extender: Jeri Cos ?Weeks in Treatment: 0 ?Foot Assessment Items ?Site Locations ?+ = Sensation present, - = Sensation absent, C = Callus, U = Ulcer ?R = Redness, W = Warmth, M = Maceration, PU = Pre-ulcerative lesion ?F = Fissure, S = Swelling, D = Dryness ?Assessment ?Right: Left: ?Other Deformity: No No ?Prior Foot Ulcer: No No ?Prior Amputation: No No ?Charcot Joint: No No ?Ambulatory Status: Ambulatory Without Help ?Gait:  Steady ?Electronic Signature(s) ?Signed: 11/20/2021 4:12:12 PM By: Levora Dredge ?Entered By: Levora Dredge on 11/20/2021 13:11:22 ?[name], [name] ([id number]) ?-------------------------------------------------------------------------------- ?Nutrition Risk Screening Details ?Patient Name: [name] ?Date of Service: 11/20/2021 12:45 PM ?Medical Record Number: [id number] ?Patient Account Number: [id number] ?Date of Birth/Sex: 1966-09-05 (56 y.o. Male) ?Treating RN: Levora Dredge ?Primary Care Jairy Angulo: [name] Other Clinician: ?Referring Seleny Allbright: [name] ?Treating Moira Umholtz/Extender: Jeri Cos ?Weeks in Treatment: 0 ?Height (in): ?Weight (lbs): ?Body Mass Index (BMI): ?Nutrition Risk Screening Items ?Score Screening ?NUTRITION RISK SCREEN: ?I have an illness or condition that made me change the kind and/or amount of food I eat 0 No ?I eat fewer than two meals per day 0 No ?I eat few fruits and vegetables, or milk products 2 Yes ?I have three or more drinks of beer, liquor or wine almost every day 0 No ?I have tooth or mouth problems that make it hard for me to eat 0 No ?I don't always have enough money to buy the food I need 0 No ?I eat alone most of the time 0 No ?I take three or more different prescribed or over-the-counter drugs a day 0 No ?Without wanting to, I have lost or gained 10 pounds in the last six months 0 No ?I am not always physically able to shop, cook and/or feed myself 0 No ?Nutrition Protocols ?Good Risk Protocol ?Moderate Risk Protocol ?Provide education on elevated ?High Risk Proctocol 0 blood sugars and impact on ?wound healing, as applicable ?Risk Level: Good Risk ?Score: 2 ?Electronic Signature(s) ?Signed: 11/20/2021 4:12:12 PM By: Levora Dredge ?Entered By: Levora Dredge on 11/20/2021 13:01:01 ?

## 2021-11-20 NOTE — Progress Notes (Addendum)
BRIX, BREARLEY ([id number]) ?Visit Report for 11/20/2021 ?Allergy List Details ?Patient Name: [name] ?Date of Service: 11/20/2021 12:45 PM ?Medical Record Number: [id number] ?Patient Account Number: [id number] ?Date of Birth/Sex: [birth year] (56 y.o. M) ?Treating RN: Levora Dredge ?Primary Care Rowe Warman: [name] Other Clinician: ?Referring Mirai Greenwood: [name] ?Treating Marigold Mom/Extender: Jeri Cos ?Weeks in Treatment: 0 ?Allergies ?Active Allergies ?No Known Drug Allergies ?Allergy Notes ?Electronic Signature(s) ?Signed: 11/20/2021 4:12:12 PM By: Levora Dredge ?Entered By: Levora Dredge on 11/20/2021 12:56:03 ?WALID, HAIG ([id number]) ?-------------------------------------------------------------------------------- ?Arrival Information Details ?Patient Name: [name] ?Date of Service: 11/20/2021 12:45 PM ?Medical Record Number: [id number] ?Patient Account Number: [id number] ?Date of Birth/Sex: [birth year] (56 y.o. M) ?Treating RN: Levora Dredge ?Primary Care Tyronn Golda: [name] Other Clinician: ?Referring Shebra Muldrow: [name] ?Treating Tilley Faeth/Extender: Jeri Cos ?Weeks in Treatment: 0 ?Visit Information ?Patient Arrived: Ambulatory ?Arrival Time: 12:53 ?Accompanied By: self ?Transfer Assistance: None ?Patient Identification Verified: Yes ?Secondary Verification Process Completed: Yes ?History Since Last Visit ?Added or deleted any medications: No ?Any new allergies or adverse reactions: No ?Had a fall or experienced change in activities of daily living that may affect risk of falls: No ?Hospitalized since last visit: No ?Has Dressing in Place as Prescribed: No ?Electronic Signature(s) ?Signed: 11/20/2021 4:12:12 PM By: Levora Dredge ?Entered By: Levora Dredge on 11/20/2021 12:53:33 ?MICHAI, DIEPPA ([id number]) ?-------------------------------------------------------------------------------- ?Clinic Level of Care Assessment Details ?Patient Name: [name] ?Date of  Service: 11/20/2021 12:45 PM ?Medical Record Number: [id number] ?Patient Account Number: [id number] ?Date of Birth/Sex: [birth year] (56 y.o. M) ?Treating RN: Donnamarie Poag ?Primary Care Janiesha Diehl: [name] Other Clinician: ?Referring Mayjor Ager: [name] ?Treating Encarnacion Scioneaux/Extender: Jeri Cos ?Weeks in Treatment: 0 ?Clinic Level of Care Assessment Items ?TOOL 1 Quantity Score ?'[]'$  - Use when EandM and Procedure is performed on INITIAL visit 0 ?ASSESSMENTS - Nursing Assessment / Reassessment ?X - General Physical Exam (combine w/ comprehensive assessment (listed just below) when performed on new ?1 20 ?pt. evals) ?X- 1 25 ?Comprehensive Assessment (HX, ROS, Risk Assessments, Wounds Hx, etc.) ?ASSESSMENTS - Wound and Skin Assessment / Reassessment ?'[]'$  - Dermatologic / Skin Assessment (not related to wound area) 0 ?ASSESSMENTS - Ostomy and/or Continence Assessment and Care ?'[]'$  - Incontinence Assessment and Management 0 ?'[]'$  - 0 ?Ostomy Care Assessment and Management (repouching, etc.) ?PROCESS - Coordination of Care ?X - Simple Patient / Family Education for ongoing care 1 15 ?'[]'$  - 0 ?Complex (extensive) Patient / Family Education for ongoing care ?'[]'$  - 0 ?Staff obtains Consents, Records, Test Results / Process Orders ?'[]'$  - 0 ?Staff telephones HHA, Nursing Homes / Clarify orders / etc ?'[]'$  - 0 ?Routine Transfer to another Facility (non-emergent condition) ?'[]'$  - 0 ?Routine Hospital Admission (non-emergent condition) ?X- 1 15 ?New Admissions / Biomedical engineer / Ordering NPWT, Apligraf, etc. ?'[]'$  - 0 ?Emergency Hospital Admission (emergent condition) ?PROCESS - Special Needs ?'[]'$  - Pediatric / Minor Patient Management 0 ?'[]'$  - 0 ?Isolation Patient Management ?'[]'$  - 0 ?Hearing / Language / Visual special needs ?'[]'$  - 0 ?Assessment of Community assistance (transportation, D/C planning, etc.) ?'[]'$  - 0 ?Additional assistance / Altered mentation ?'[]'$  - 0 ?Support Surface(s) Assessment (bed, cushion, seat,  etc.) ?INTERVENTIONS - Miscellaneous ?'[]'$  - External ear exam 0 ?'[]'$  - 0 ?Patient Transfer (multiple staff / Civil Service fast streamer / Similar devices) ?'[]'$  - 0 ?Simple Staple / Suture removal (25 or less) ?'[]'$  - 0 ?Complex Staple / Suture removal (26 or more) ?'[]'$  - 0 ?Hypo/Hyperglycemic Management (do not check if billed  separately) ?X- 1 15 ?Ankle / Brachial Index (ABI) - do not check if billed separately ?Has the patient been seen at the hospital within the last three years: Yes ?Total Score: 90 ?Level Of Care: New/Established - Level ?3 ?CHESKEL, SILVERIO ([id number]) ?Electronic Signature(s) ?Signed: 11/27/2021 2:33:48 PM By: Donnamarie Poag ?Entered ByDonnamarie Poag on 11/27/2021 14:31:59 ?JRU, PENSE ([id number]) ?-------------------------------------------------------------------------------- ?Encounter Discharge Information Details ?Patient Name: [name] ?Date of Service: 11/20/2021 12:45 PM ?Medical Record Number: [id number] ?Patient Account Number: [id number] ?Date of Birth/Sex: Jan 19, 1966 (56 y.o. M) ?Treating RN: Donnamarie Poag ?Primary Care Millie Forde: [name] Other Clinician: ?Referring Monette Omara: [name] ?Treating Phiona Ramnauth/Extender: Jeri Cos ?Weeks in Treatment: 0 ?Encounter Discharge Information Items Post Procedure Vitals ?Discharge Condition: Stable ?Temperature (?F): 97.5 ?Ambulatory Status: Ambulatory ?Pulse (bpm): 96 ?Discharge Destination: Home ?Respiratory Rate (breaths/min): 16 ?Transportation: Private Auto ?Blood Pressure (mmHg): 140/89 ?Accompanied By: self ?Schedule Follow-up Appointment: Yes ?Clinical Summary of Care: ?Electronic Signature(s) ?Signed: 11/27/2021 2:33:16 PM By: Donnamarie Poag ?Entered ByDonnamarie Poag on 11/27/2021 14:33:16 ?PAT, SIRES ([id number]) ?-------------------------------------------------------------------------------- ?Lower Extremity Assessment Details ?Patient Name: [name] ?Date of Service: 11/20/2021 12:45 PM ?Medical Record Number: [id number] ?Patient Account  Number: [id number] ?Date of Birth/Sex: [birth year] (56 y.o. M) ?Treating RN: Levora Dredge ?Primary Care Wong Steadham: [name] Other Clinician: ?Referring Allean Montfort: [name] ?Treating Royal Beirne/Extender: Jeri Cos ?Weeks in Treatment: 0 ?Edema Assessment ?Assessed: [Left: No] [Right: No] ?Edema: [Left: Ye] [Right: s] ?Calf ?Left: Right: ?Point of Measurement: 36 cm From Medial Instep 47 cm 45.5 cm ?Ankle ?Left: Right: ?Point of Measurement: 10 cm From Medial Instep 27.5 cm 28.4 cm ?Vascular Assessment ?Pulses: ?Dorsalis Pedis ?Palpable: [Left:Yes] [Right:Yes] ?Posterior Tibial ?Doppler Audible: [Left:Yes] [Right:Yes] ?Blood Pressure: ?Brachial: [Left:140] [Right:120] ?Dorsalis Pedis: 150 ?[Left:Dorsalis Pedis: 619] ?Ankle: ?Posterior Tibial: 150 ?[Left:Posterior Tibial: 160 1.07] [Right:1.14] ?Electronic Signature(s) ?Signed: 11/20/2021 4:12:12 PM By: Levora Dredge ?Entered By: Levora Dredge on 11/20/2021 13:31:39 ?KEN, BONN ([id number]) ?-------------------------------------------------------------------------------- ?Multi Wound Chart Details ?Patient Name: [name] ?Date of Service: 11/20/2021 12:45 PM ?Medical Record Number: [id number] ?Patient Account Number: [id number] ?Date of Birth/Sex: 02-Mar-1966 (56 y.o. M) ?Treating RN: Levora Dredge ?Primary Care Liller Yohn: [name] Other Clinician: ?Referring Charlyn Vialpando: [name] ?Treating Joplin Canty/Extender: Jeri Cos ?Weeks in Treatment: 0 ?Vital Signs ?Height(in): 68 ?Pulse(bpm): 96 ?Weight(lbs): 360 ?Blood Pressure(mmHg): 140/89 ?Body Mass Index(BMI): 54.7 ?Temperature(??F): 97.5 ?Respiratory Rate(breaths/min): 18 ?Photos: ?Wound Location: Right, Lateral Lower Leg Right, Medial Lower Leg Left Lower Leg ?Wounding Event: Gradually Appeared Gradually Appeared Gradually Appeared ?Primary Etiology: Diabetic Wound/Ulcer of the Lower Diabetic Wound/Ulcer of the Lower Diabetic Wound/Ulcer of the Lower ?Extremity Extremity  Extremity ?Comorbid History: Hypertension, Type II Diabetes, Hypertension, Type II Diabetes, Hypertension, Type II Diabetes, ?Gout Gout Gout ?Date Acquired: 05/07/2021 05/07/2021 05/07/2021 ?Weeks of Treatment: 0 0 0 ?Wound Status: Open Open Open

## 2021-11-24 ENCOUNTER — Encounter: Payer: Self-pay | Admitting: Physician Assistant

## 2021-11-24 ENCOUNTER — Ambulatory Visit: Payer: Self-pay | Admitting: Licensed Clinical Social Worker

## 2021-11-24 ENCOUNTER — Ambulatory Visit: Payer: Self-pay | Admitting: Physician Assistant

## 2021-11-24 ENCOUNTER — Other Ambulatory Visit: Payer: Self-pay

## 2021-11-24 VITALS — BP 125/70 | HR 100 | Temp 98.4°F | Wt 365.0 lb

## 2021-11-24 DIAGNOSIS — E1165 Type 2 diabetes mellitus with hyperglycemia: Secondary | ICD-10-CM

## 2021-11-24 DIAGNOSIS — E785 Hyperlipidemia, unspecified: Secondary | ICD-10-CM

## 2021-11-24 DIAGNOSIS — G8929 Other chronic pain: Secondary | ICD-10-CM

## 2021-11-24 DIAGNOSIS — F419 Anxiety disorder, unspecified: Secondary | ICD-10-CM

## 2021-11-24 DIAGNOSIS — I87313 Chronic venous hypertension (idiopathic) with ulcer of bilateral lower extremity: Secondary | ICD-10-CM

## 2021-11-24 DIAGNOSIS — F509 Eating disorder, unspecified: Secondary | ICD-10-CM

## 2021-11-24 DIAGNOSIS — I10 Essential (primary) hypertension: Secondary | ICD-10-CM

## 2021-11-24 DIAGNOSIS — L97919 Non-pressure chronic ulcer of unspecified part of right lower leg with unspecified severity: Secondary | ICD-10-CM

## 2021-11-24 NOTE — Progress Notes (Signed)
? ?BP 125/70   Pulse 100   Temp 98.4 ?F (36.9 ?C)   Wt (!) 365 lb (165.6 kg)   SpO2 97%   BMI 55.50 kg/m?   ? ?Subjective:  ? ? Patient ID: [name], male    DOB: 08-19-1966, 56 y.o.   MRN: [id number] ? ?HPI: ?[name] is a 56 y.o. male presenting on 11/24/2021 for Follow-up ? ? ?HPI ? ?Pt is 70yoM with multiple medical problems including uncontrolled DM, super morbid obesity, eating disorder,  HTN, dyslipidemia and lower extremity wounds, all of which are complicated by chronic noncompliance.  He is taking opioids regularly for his knee pain.  He continues to report a lot of stress related to caring for his special needs son.   ? ?Pt is having problems with his trulicity device.  He says it isn't pentrating his skin. ? ?He is going to wound clinic for wounds on lower extremities.  Those records were reviewed.  He was seen last week and says he is to return there later this week ? ?He saw endocrinology last week for his uncontrolled DM ? ?He missed his [facility] appt this morning. ? ?He has not scheduled with urologist at [facility] yet.  He says he didn't get appointment due to he has a lot going on.   ? ?He has no new issues today other than the problems with the trulicity device.  ? ? ? ? ? ?Relevant past medical, surgical, family and social history reviewed and updated as indicated. Interim medical history since our last visit reviewed. ?Allergies and medications reviewed and updated. ? ? ?Current Outpatient Medications:  ?  amLODipine (NORVASC) 5 MG tablet, Take 1 tablet (5 mg total) by mouth daily., Disp: 90 tablet, Rfl: 0 ?  atorvastatin (LIPITOR) 40 MG tablet, Take 1 tablet (40 mg total) by mouth daily., Disp: 90 tablet, Rfl: 1 ?  Dulaglutide (TRULICITY) 4.94 WH/6.7RF SOPN, Inject 0.75 mg into the skin once a week., Disp: 2 mL, Rfl: 0 ?  furosemide (LASIX) 20 MG tablet, Take 1 tablet (20 mg total) by mouth daily., Disp: 30 tablet, Rfl: 1 ?  glipiZIDE (GLUCOTROL XL) 5 MG 24 hr tablet, Take 1 tablet (5 mg total)  by mouth daily with breakfast., Disp: 90 tablet, Rfl: 3 ?  insulin glargine (LANTUS) 100 UNIT/ML injection, Inject 0.8 mLs (80 Units total) into the skin at bedtime., Disp: 30 mL, Rfl: 3 ?  Insulin Glulisine (APIDRA SOLOSTAR Star), Inject 16-22 Units into the skin in the morning, at noon, and at bedtime., Disp: , Rfl:  ?  lisinopril (ZESTRIL) 20 MG tablet, Take 1 tablet (20 mg total) by mouth daily., Disp: 90 tablet, Rfl: 0 ?  metFORMIN (GLUCOPHAGE XR) 500 MG 24 hr tablet, Take 2 tablets (1,000 mg total) by mouth daily with breakfast., Disp: 90 tablet, Rfl: 1 ?  metoprolol tartrate (LOPRESSOR) 100 MG tablet, Take 1 tablet (100 mg total) by mouth 2 (two) times daily., Disp: 180 tablet, Rfl: 0 ?  oxyCODONE-acetaminophen (PERCOCET) 10-325 MG tablet, Take 1 tablet by mouth every 4 (four) hours as needed for pain., Disp: 170 tablet, Rfl: 0 ?  allopurinol (ZYLOPRIM) 300 MG tablet, Take 1 tablet (300 mg total) by mouth daily. (Patient not taking: Reported on 11/24/2021), Disp: 30 tablet, Rfl: 5 ?  cyclobenzaprine (FLEXERIL) 10 MG tablet, Take 1 tablet (10 mg total) by mouth 3 (three) times daily as needed for muscle spasms. (Patient not taking: Reported on 11/24/2021), Disp: 30 tablet, Rfl:  0 ?  Dulaglutide (TRULICITY) 1.5 AX/6.5VV SOPN, Inject 1.5 mg into the skin once a week. (Patient not taking: Reported on 11/18/2021), Disp: 3 mL, Rfl: 3 ?  nystatin cream (MYCOSTATIN), Apply 1 application topically 2 (two) times daily. To penile irritation. (Patient not taking: Reported on 11/24/2021), Disp: 30 g, Rfl: 3 ? ? ? ? ?Review of Systems ? ?Per HPI unless specifically indicated above ? ?   ?Objective:  ?  ?BP 125/70   Pulse 100   Temp 98.4 ?F (36.9 ?C)   Wt (!) 365 lb (165.6 kg)   SpO2 97%   BMI 55.50 kg/m?   ?Wt Readings from Last 3 Encounters:  ?11/24/21 (!) 365 lb (165.6 kg)  ?11/18/21 (!) 362 lb (164.2 kg)  ?10/29/21 (!) 363 lb (164.7 kg)  ?  ?Physical Exam ?Vitals reviewed.  ?Constitutional:   ?   General: He is not in  acute distress. ?   Appearance: He is well-developed. He is obese. He is not toxic-appearing.  ?HENT:  ?   Head: Normocephalic and atraumatic.  ?Cardiovascular:  ?   Rate and Rhythm: Normal rate and regular rhythm.  ?Pulmonary:  ?   Effort: Pulmonary effort is normal.  ?   Breath sounds: Normal breath sounds. No wheezing.  ?Musculoskeletal:  ?   Cervical back: Neck supple.  ?Lymphadenopathy:  ?   Cervical: No cervical adenopathy.  ?Skin: ?   General: Skin is warm and dry.  ?Neurological:  ?   Mental Status: He is alert and oriented to person, place, and time.  ?Psychiatric:     ?   Attention and Perception: Attention normal.     ?   Speech: Speech normal.     ?   Behavior: Behavior normal. Behavior is cooperative.  ? ? ? ? ? ?   ?Assessment & Plan:  ? ?Encounter Diagnoses  ?Name Primary?  ? Uncontrolled type 2 diabetes mellitus with hyperglycemia ([place]) Yes  ? Morbid obesity ([place])   ? 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)   ? Essential hypertension   ? Hyperlipidemia, unspecified hyperlipidemia type   ? Anxiety   ? Eating disorder, unspecified type   ? Chronic pain of both knees   ? ? ? ?-pt is rescheduled to see [facility] for [place] issues ?-pt to continue with wound center for LE wounds ?-pt to continue with endocrinology for uncontrolled DM.  Worked with pt and discussed how to properly use the trulicity device.  He is encouraged to contact [facility] or his endocrinologist if he continues to have difficulty ?-pt to continue with orthopedist per his recommendations ?-pt has information to contact urologist when he decides he is ready to go ?-pt to follow up 2 month.  He is to contact office sooner prn ?

## 2021-11-26 ENCOUNTER — Ambulatory Visit: Payer: Self-pay

## 2021-11-27 ENCOUNTER — Other Ambulatory Visit: Payer: Self-pay

## 2021-11-27 NOTE — Progress Notes (Signed)
STEVAN, EBERWEIN ([id number]) ?Visit Report for 11/27/2021 ?Arrival Information Details ?Patient Name: [name] ?Date of Service: 11/27/2021 3:15 PM ?Medical Record Number: [id number] ?Patient Account Number: [id number] ?Date of Birth/Sex: 04-10-1966 (56 y.o. M) ?Treating RN: Donnamarie Poag ?Primary Care Yeny Schmoll: [name] Other Clinician: ?Referring Kenzel Ruesch: [name] ?Treating Katina Remick/Extender: Jeri Cos ?Weeks in Treatment: 1 ?Visit Information History Since Last Visit ?Added or deleted any medications: No ?Patient Arrived: Ambulatory ?Had a fall or experienced change in No ?Arrival Time: 15:35 ?activities of daily living that may affect ?Accompanied By: SELF ?risk of falls: ?Transfer Assistance: None ?Hospitalized since last visit: No ?Patient Identification Verified: Yes ?Has Dressing in Place as Prescribed: Yes ?Secondary Verification Process Completed: Yes ?Has Compression in Place as Prescribed: Yes ?Patient Requires Transmission-Based Precautions: No ?Pain Present Now: No ?Patient Has Alerts: Yes ?Patient Alerts: Diabetic ?Electronic Signature(s) ?Signed: 11/27/2021 3:57:59 PM By: Donnamarie Poag ?Entered ByDonnamarie Poag on 11/27/2021 15:57:58 ?SHEPHERD, FINNAN ([id number]) ?-------------------------------------------------------------------------------- ?Compression Therapy Details ?Patient Name: [name] ?Date of Service: 11/27/2021 3:15 PM ?Medical Record Number: [id number] ?Patient Account Number: [id number] ?Date of Birth/Sex: October 08, 1965 (56 y.o. M) ?Treating RN: Donnamarie Poag ?Primary Care Rayah Fines: [name] Other Clinician: ?Referring Cougar Imel: [name] ?Treating Zakaree Mcclenahan/Extender: Jeri Cos ?Weeks in Treatment: 1 ?Compression Therapy Performed for Wound Assessment: Wound #2 Right,Lateral Lower Leg ?Performed By: Clinician Donnamarie Poag, RN ?Compression Type: Four Layer ?Electronic Signature(s) ?Signed: 11/27/2021 4:02:25 PM By: Donnamarie Poag ?Entered ByDonnamarie Poag on 11/27/2021  16:02:24 ?TERRION, GENCARELLI ([id number]) ?-------------------------------------------------------------------------------- ?Compression Therapy Details ?Patient Name: [name] ?Date of Service: 11/27/2021 3:15 PM ?Medical Record Number: [id number] ?Patient Account Number: [id number] ?Date of Birth/Sex: 18-Jan-1966 (56 y.o. M) ?Treating RN: Donnamarie Poag ?Primary Care Nora Sabey: [name] Other Clinician: ?Referring Lenae Wherley: [name] ?Treating Tanyla Stege/Extender: Jeri Cos ?Weeks in Treatment: 1 ?Compression Therapy Performed for Wound Assessment: Wound #4 Left Lower Leg ?Performed By: Clinician Donnamarie Poag, RN ?Compression Type: Four Layer ?Electronic Signature(s) ?Signed: 11/27/2021 4:02:25 PM By: Donnamarie Poag ?Entered ByDonnamarie Poag on 11/27/2021 16:02:25 ?JAECEON, MICHELIN ([id number]) ?-------------------------------------------------------------------------------- ?Encounter Discharge Information Details ?Patient Name: [name] ?Date of Service: 11/27/2021 3:15 PM ?Medical Record Number: [id number] ?Patient Account Number: [id number] ?Date of Birth/Sex: 1966/03/28 (56 y.o. M) ?Treating RN: Donnamarie Poag ?Primary Care Kaylanni Ezelle: [name] Other Clinician: ?Referring Yobana Culliton: [name] ?Treating Krystina Strieter/Extender: Jeri Cos ?Weeks in Treatment: 1 ?Encounter Discharge Information Items ?Discharge Condition: Stable ?Ambulatory Status: Ambulatory ?Discharge Destination: Home ?Transportation: Private Auto ?Accompanied By: son ?Schedule Follow-up Appointment: Yes ?Clinical Summary of Care: ?Electronic Signature(s) ?Signed: 11/27/2021 4:03:37 PM By: Donnamarie Poag ?Entered ByDonnamarie Poag on 11/27/2021 16:03:37 ?LINDWOOD, MOGEL ([id number]) ?-------------------------------------------------------------------------------- ?Wound Assessment Details ?Patient Name: [name] ?Date of Service: 11/27/2021 3:15 PM ?Medical Record Number: [id number] ?Patient Account Number: [id number] ?Date of Birth/Sex: 03/01/1966  (56 y.o. M) ?Treating RN: Donnamarie Poag ?Primary Care Adaleena Mooers: [name] Other Clinician: ?Referring Oluwaseyi Raffel: [name] ?Treating Rosalinda Seaman/Extender: Jeri Cos ?Weeks in Treatment: 1 ?Wound Status ?Wound Number: 2 ?Primary Etiology: Diabetic Wound/Ulcer of the Lower Extremity ?Wound Location: Right, Lateral Lower Leg ?Wound Status: Open ?Wounding Event: Gradually Appeared ?Comorbid History: Hypertension, Type II Diabetes, Gout ?Date Acquired: 05/07/2021 ?Weeks Of Treatment: 1 ?Clustered Wound: Yes ?Wound Measurements ?Length: (cm) 3.5 ?Width: (cm) 1.5 ?Depth: (cm) 0.1 ?Clustered Quantity: 2 ?Area: (cm?) 4.123 ?Volume: (cm?) 0.412 ?% Reduction in Area: 0% ?% Reduction in Volume: 0% ?Epithelialization: None ?Wound Description ?Classification: Grade 2 ?Exudate Amount: Medium ?Exudate Type: Serosanguineous ?Exudate Color: red, brown ?  Foul Odor After Cleansing: No ?Slough/Fibrino Yes ?Wound Bed ?Granulation Amount: None Present (0%) ?Necrotic Amount: Large (67-100%) ?Necrotic Quality: [place], Adherent Slough ?Treatment Notes ?Wound #2 (Lower Leg) Wound Laterality: Right, Lateral ?Cleanser ?Soap and Water ?Discharge Instruction: Gently cleanse wound with antibacterial soap, rinse and pat dry prior to dressing wounds ?Wound Cleanser ?Discharge Instruction: Wash your hands with soap and water. Remove old dressing, discard into plastic bag and place into trash. ?Cleanse the wound with Wound Cleanser prior to applying a clean dressing using gauze sponges, not tissues or cotton balls. Do ?not scrub or use excessive force. Pat dry using gauze sponges, not tissue or cotton balls. ?Peri-Wound Care ?Topical ?Primary Dressing ?Hydrofera Blue Ready Transfer Foam, 2.5x2.5 (in/in) ?Discharge Instruction: Apply Hydrofera Blue Ready to wound bed as directed ?Secondary Dressing ?(NON-BORDER) Zetuvit Plus Silicone NON-BORDER 5x5 (in/in) ?Discharge Instruction: Please do not put silicone bordered dressings under wraps. Use  non-bordered dressing only under wraps. ?Secured With ?Compression Wrap ?3-LAYER WRAP - Profore Lite LF 3 Multilayer Compression Bandaging System ?EOGHAN, BELCHER ([id number]) ?Discharge Instruction: Apply 3 multi-layer wrap as prescribed. ?Compression Stockings ?Add-Ons ?Electronic Signature(s) ?Signed: 11/27/2021 3:58:35 PM By: Donnamarie Poag ?Entered ByDonnamarie Poag on 11/27/2021 15:58:35 ?DHAVAL, WOO ([id number]) ?-------------------------------------------------------------------------------- ?Wound Assessment Details ?Patient Name: [name] ?Date of Service: 11/27/2021 3:15 PM ?Medical Record Number: [id number] ?Patient Account Number: [id number] ?Date of Birth/Sex: 1966-03-12 (56 y.o. M) ?Treating RN: Donnamarie Poag ?Primary Care Westen Dinino: [name] Other Clinician: ?Referring Ranald Alessio: [name] ?Treating Sonyia Muro/Extender: Jeri Cos ?Weeks in Treatment: 1 ?Wound Status ?Wound Number: 3 ?Primary Etiology: Diabetic Wound/Ulcer of the Lower Extremity ?Wound Location: Right, Medial Lower Leg ?Wound Status: Open ?Wounding Event: Gradually Appeared ?Comorbid History: Hypertension, Type II Diabetes, Gout ?Date Acquired: 05/07/2021 ?Weeks Of Treatment: 1 ?Clustered Wound: Yes ?Wound Measurements ?Length: (cm) 3.2 ?Width: (cm) 5 ?Depth: (cm) 0.2 ?Clustered Quantity: 2 ?Area: (cm?) 12.566 ?Volume: (cm?) 2.513 ?% Reduction in Area: 0% ?% Reduction in Volume: 0% ?Epithelialization: None ?Wound Description ?Classification: Grade 2 ?Exudate Amount: Medium ?Exudate Type: Serosanguineous ?Exudate Color: red, brown ?Foul Odor After Cleansing: No ?Slough/Fibrino Yes ?Wound Bed ?Granulation Amount: Large (67-100%) Exposed Structure ?Granulation Quality: Red, Pink ?Fat Layer (Subcutaneous Tissue) Exposed: Yes ?Necrotic Amount: Small (1-33%) ?Necrotic Quality: Adherent Slough ?Treatment Notes ?Wound #3 (Lower Leg) Wound Laterality: Right, Medial ?Cleanser ?Soap and Water ?Discharge Instruction: Gently cleanse wound with  antibacterial soap, rinse and pat dry prior to dressing wounds ?Wound Cleanser ?Discharge Instruction: Wash your hands with soap and water. Remove old dressing, discard into plastic bag and place into trash.

## 2021-11-28 NOTE — Progress Notes (Signed)
SPARROW, SANZO ([id number]) ?Visit Report for 11/27/2021 ?Physician Orders Details ?Patient Name: [name] ?Date of Service: 11/27/2021 3:15 PM ?Medical Record Number: [id number] ?Patient Account Number: [id number] ?Date of Birth/Sex: 1966/05/14 (56 y.o. M) ?Treating RN: Donnamarie Poag ?Primary Care Provider: [name] Other Clinician: ?Referring Provider: [name] ?Treating Provider/Extender: Jeri Cos ?Weeks in Treatment: 1 ?Verbal / Phone Orders: No ?Diagnosis Coding ?Follow-up Appointments ?o Return Appointment in 1 week. ?o Nurse Visit as needed ?Bathing/ Shower/ Hygiene ?o May shower with wound dressing protected with water repellent cover or cast protector. ?o No tub bath. ?Anesthetic (Use 'Patient Medications' Section for Anesthetic Order Entry) ?o Lidocaine applied to wound bed ?Edema Control - Lymphedema / Segmental Compressive Device / Other ?o Optional: One layer of unna paste to top of compression wrap (to act as an anchor). ?o Elevate leg(s) parallel to the floor when sitting. ?o DO YOUR BEST to sleep in the bed at night. DO NOT sleep in your recliner. Long hours of sitting in a recliner leads to ?swelling of the legs and/or potential wounds on your backside. ?Additional Orders / Instructions ?o Follow Nutritious Diet and Increase Protein Intake ?Wound Treatment ?Wound #2 - Lower Leg Wound Laterality: Right, Lateral ?Cleanser: Soap and Water 2 x Per Week/30 Days ?Discharge Instructions: Gently cleanse wound with antibacterial soap, rinse and pat dry prior to dressing wounds ?Cleanser: Wound Cleanser 2 x Per Week/30 Days ?Discharge Instructions: Wash your hands with soap and water. Remove old dressing, discard into plastic bag and place into trash. ?Cleanse the wound with Wound Cleanser prior to applying a clean dressing using gauze sponges, not tissues or cotton balls. Do not ?scrub or use excessive force. Pat dry using gauze sponges, not tissue or cotton balls. ?Primary  Dressing: Hydrofera Blue Ready Transfer Foam, 2.5x2.5 (in/in) 2 x Per Week/30 Days ?Discharge Instructions: Apply Hydrofera Blue Ready to wound bed as directed ?Secondary Dressing: (NON-BORDER) Zetuvit Plus Silicone NON-BORDER 5x5 (in/in) 2 x Per Week/30 Days ?Discharge Instructions: Please do not put silicone bordered dressings under wraps. Use non-bordered dressing only under ?wraps. ?Compression Wrap: 3-LAYER WRAP - Profore Lite LF 3 Multilayer Compression Bandaging System 2 x Per Week/30 Days ?Discharge Instructions: Apply 3 multi-layer wrap as prescribed. ?Wound #3 - Lower Leg Wound Laterality: Right, Medial ?Cleanser: Soap and Water 2 x Per Week/30 Days ?Discharge Instructions: Gently cleanse wound with antibacterial soap, rinse and pat dry prior to dressing wounds ?Cleanser: Wound Cleanser 2 x Per Week/30 Days ?Discharge Instructions: Wash your hands with soap and water. Remove old dressing, discard into plastic bag and place into trash. ?Cleanse the wound with Wound Cleanser prior to applying a clean dressing using gauze sponges, not tissues or cotton balls. Do not ?scrub or use excessive force. Pat dry using gauze sponges, not tissue or cotton balls. ?Primary Dressing: Hydrofera Blue Ready Transfer Foam, 2.5x2.5 (in/in) 2 x Per Week/30 Days ?Discharge Instructions: Apply Hydrofera Blue Ready to wound bed as directed ?LOYS, SHUGARS ([id number]) ?Secondary Dressing: (NON-BORDER) Zetuvit Plus Silicone NON-BORDER 5x5 (in/in) 2 x Per Week/30 Days ?Discharge Instructions: Please do not put silicone bordered dressings under wraps. Use non-bordered dressing only under ?wraps. ?Compression Wrap: 3-LAYER WRAP - Profore Lite LF 3 Multilayer Compression Bandaging System 2 x Per Week/30 Days ?Discharge Instructions: Apply 3 multi-layer wrap as prescribed. ?Wound #4 - Lower Leg Wound Laterality: Left ?Cleanser: Soap and Water 2 x Per Week/30 Days ?Discharge Instructions: Gently cleanse wound with antibacterial soap, rinse  and pat dry prior  to dressing wounds ?Cleanser: Wound Cleanser 2 x Per Week/30 Days ?Discharge Instructions: Wash your hands with soap and water. Remove old dressing, discard into plastic bag and place into trash. ?Cleanse the wound with Wound Cleanser prior to applying a clean dressing using gauze sponges, not tissues or cotton balls. Do not ?scrub or use excessive force. Pat dry using gauze sponges, not tissue or cotton balls. ?Primary Dressing: Hydrofera Blue Ready Transfer Foam, 2.5x2.5 (in/in) 2 x Per Week/30 Days ?Discharge Instructions: Apply Hydrofera Blue Ready to wound bed as directed ?Secondary Dressing: (NON-BORDER) Zetuvit Plus Silicone NON-BORDER 5x5 (in/in) 2 x Per Week/30 Days ?Discharge Instructions: Please do not put silicone bordered dressings under wraps. Use non-bordered dressing only under ?wraps. ?Compression Wrap: 3-LAYER WRAP - Profore Lite LF 3 Multilayer Compression Bandaging System 2 x Per Week/30 Days ?Discharge Instructions: Apply 3 multi-layer wrap as prescribed. ?Electronic Signature(s) ?Signed: 11/27/2021 4:03:12 PM By: Donnamarie Poag ?Signed: 11/27/2021 5:58:01 PM By: Worthy Keeler PA-C ?Entered ByDonnamarie Poag on 11/27/2021 16:03:12 ?BATES, COLLINGTON ([id number]) ?-------------------------------------------------------------------------------- ?SuperBill Details ?Patient Name: [name]. ?Date of Service: 11/27/2021 ?Medical Record Number: [id number] ?Patient Account Number: [id number] ?Date of Birth/Sex: 02-08-1966 (56 y.o. 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 ?Electronic Signature(s) ?Signed: 11/27/2021 4:03:47 PM By: Donnamarie Poag ?Signed: 11/27/2021 5:58:01 PM By: Worthy Keeler PA-C ?Entered ByDonnamarie Poag on 11/27/2021 16:03:47 ?

## 2021-12-01 ENCOUNTER — Telehealth: Payer: Self-pay

## 2021-12-01 ENCOUNTER — Ambulatory Visit: Payer: Self-pay | Admitting: Licensed Clinical Social Worker

## 2021-12-01 DIAGNOSIS — F419 Anxiety disorder, unspecified: Secondary | ICD-10-CM

## 2021-12-01 MED ORDER — OXYCODONE-ACETAMINOPHEN 10-325 MG PO TABS: 1.0000 | ORAL_TABLET | ORAL | 0 refills | Status: DC | PRN

## 2021-12-01 NOTE — Progress Notes (Signed)
[facility] engaged patient in follow-up session. [facility] provided active listening and validation as patient shared about current anxiety. [place] used MI to work with patient on eating habits and thought processes related. [facility] led patient in visualization breathing exercise. ?

## 2021-12-01 NOTE — Telephone Encounter (Signed)
Oxycodone-Acetaminophen 10/325 MG Qty 170 Tablets   PATIENT USES [facility] 

## 2021-12-04 ENCOUNTER — Encounter: Payer: Self-pay | Admitting: Physician Assistant

## 2021-12-04 NOTE — Progress Notes (Addendum)
MIKLO, AKEN ([id number]) ?Visit Report for 12/04/2021 ?Arrival Information Details ?Patient Name: [name] ?Date of Service: 12/04/2021 8:15 AM ?Medical Record Number: [id number] ?Patient Account Number: [id number] ?Date of Birth/Sex: 08/21/1966 (56 y.o. M) ?Treating RN: Cornell Barman ?Primary Care Mikeria Valin: [name] Other Clinician: ?Referring Kasumi Ditullio: [name] ?Treating Maizie Garno/Extender: Jeri Cos ?Weeks in Treatment: 2 ?Visit Information History Since Last Visit ?Added or deleted any medications: No ?Patient Arrived: Ambulatory ?Has Dressing in Place as Prescribed: Yes ?Arrival Time: 08:42 ?Pain Present Now: Yes ?Accompanied By: self ?Transfer Assistance: None ?Patient Identification Verified: Yes ?Secondary Verification Process Completed: Yes ?Patient Requires Transmission-Based Precautions: No ?Patient Has Alerts: Yes ?Patient Alerts: Diabetic ?Electronic Signature(s) ?Signed: 12/04/2021 4:29:09 PM By: Gretta Cool, BSN, RN, CWS, Kim RN, BSN ?Entered By: Gretta Cool, BSN, RN, CWS, Kim on 12/04/2021 08:43:49 ?DONZEL, ROMACK ([id number]) ?-------------------------------------------------------------------------------- ?Encounter Discharge Information Details ?Patient Name: [name] ?Date of Service: 12/04/2021 8:15 AM ?Medical Record Number: [id number] ?Patient Account Number: [id number] ?Date of Birth/Sex: 1966-01-16 (56 y.o. M) ?Treating RN: Cornell Barman ?Primary Care Patience Nuzzo: [name] Other Clinician: ?Referring Marshella Tello: [name] ?Treating Ronnie Doo/Extender: Jeri Cos ?Weeks in Treatment: 2 ?Encounter Discharge Information Items Post Procedure Vitals ?Discharge Condition: Stable ?Temperature (?F): 97.8 ?Ambulatory Status: Ambulatory ?Pulse (bpm): 99 ?Discharge Destination: Home ?Respiratory Rate (breaths/min): 16 ?Transportation: Private Auto ?Blood Pressure (mmHg): 147/78 ?Accompanied By: self ?Schedule Follow-up Appointment: Yes ?Clinical Summary of Care: ?Electronic  Signature(s) ?Signed: 12/04/2021 4:29:09 PM By: Gretta Cool, BSN, RN, CWS, Kim RN, BSN ?Entered By: Gretta Cool, BSN, RN, CWS, Kim on 12/04/2021 10:27:38 ?NAJIR, ROOP ([id number]) ?-------------------------------------------------------------------------------- ?Lower Extremity Assessment Details ?Patient Name: [name] ?Date of Service: 12/04/2021 8:15 AM ?Medical Record Number: [id number] ?Patient Account Number: [id number] ?Date of Birth/Sex: [birth year] (56 y.o. M) ?Treating RN: Cornell Barman ?Primary Care Aadan Chenier: [name] Other Clinician: ?Referring Ugo Thoma: [name] ?Treating Nehemie Casserly/Extender: Jeri Cos ?Weeks in Treatment: 2 ?Edema Assessment ?Assessed: [Left: No] [Right: No] ?[Left: Edema] [Right: :] ?Calf ?Left: Right: ?Point of Measurement: 36 cm From Medial Instep 45.5 cm 44 cm ?Ankle ?Left: Right: ?Point of Measurement: 10 cm From Medial Instep 27 cm 26 cm ?Vascular Assessment ?Pulses: ?Dorsalis Pedis ?Palpable: [Left:Yes] [Right:Yes] ?Electronic Signature(s) ?Signed: 12/04/2021 4:29:09 PM By: Gretta Cool, BSN, RN, CWS, Kim RN, BSN ?Entered By: Gretta Cool, BSN, RN, CWS, Kim on 12/04/2021 08:59:09 ?OWYN, RAULSTON ([id number]) ?-------------------------------------------------------------------------------- ?Multi Wound Chart Details ?Patient Name: [name] ?Date of Service: 12/04/2021 8:15 AM ?Medical Record Number: [id number] ?Patient Account Number: [id number] ?Date of Birth/Sex: April 23, 1966 (57 y.o. M) ?Treating RN: Cornell Barman ?Primary Care Donika Butner: [name] Other Clinician: ?Referring Katee Wentland: [name] ?Treating Saree Krogh/Extender: Jeri Cos ?Weeks in Treatment: 2 ?Vital Signs ?Height(in): 68 ?Pulse(bpm): 99 ?Weight(lbs): 360 ?Blood Pressure(mmHg): 147/78 ?Body Mass Index(BMI): 54.7 ?Temperature(??F): 97.8 ?Respiratory Rate(breaths/min): 16 ?Photos: [2:No Photos] [3:No Photos] [4:No Photos] ?Wound Location: [2:Right, Lateral Lower Leg] [3:Right, Medial Lower Leg] [4:Left Lower  Leg] ?Wounding Event: [2:Gradually Appeared] [3:Gradually Appeared] [4:Gradually Appeared] ?Primary Etiology: [2:Diabetic Wound/Ulcer of the Lower Diabetic Wound/Ulcer of the Lower Diabetic Wound/Ulcer of the Lower Extremity] [3:Extremity] [4:Extremity] ?Date Acquired: [2:05/07/2021] [3:05/07/2021] [4:05/07/2021] ?Weeks of Treatment: [2:2] [3:2] [4:2] ?Wound Status: [2:Open] [3:Open] [4:Open] ?Wound Recurrence: [2:No] [3:No] [4:No] ?Clustered Wound: [2:Yes] [3:Yes] [4:Yes] ?Measurements L x W x D (cm) [2:0.4x1x0.1] [3:3x1x0.2] [4:0.1x0.1x0.1] ?Area (cm?) : [2:0.314] [3:2.356] [4:0.008] ?Volume (cm?) : [2:0.031] [3:0.471] [4:0.001] ?% Reduction in Area: [2:92.40%] [3:81.30%] [4:99.90%] ?% Reduction in Volume: [2:92.50%] [3:81.30%] [4:99.90%] ?Classification: [2:Grade 2] [3:Grade 2] [4:Grade 2] ?Exudate Amount: [2:Medium] [3:Medium] [4:Medium] ?Exudate  Type: [2:Serosanguineous red, brown] [3:Serosanguineous red, brown] [4:Serosanguineous red, brown] ?Treatment Notes ?Electronic Signature(s) ?Signed: 12/04/2021 4:29:09 PM By: Gretta Cool, BSN, RN, CWS, Kim RN, BSN ?Entered By: Gretta Cool, BSN, RN, CWS, Kim on 12/04/2021 10:23:51 ?MINARD, MILLIRONS ([id number]) ?-------------------------------------------------------------------------------- ?Multi-Disciplinary Care Plan Details ?Patient Name: [name] ?Date of Service: 12/04/2021 8:15 AM ?Medical Record Number: [id number] ?Patient Account Number: [id number] ?Date of Birth/Sex: November 19, 1965 (56 y.o. M) ?Treating RN: Cornell Barman ?Primary Care Donnell Beauchamp: [name] Other Clinician: ?Referring Trevone Prestwood: [name] ?Treating Loys Shugars/Extender: Jeri Cos ?Weeks in Treatment: 2 ?Active Inactive ?Orientation to the Wound Care Program ?Nursing Diagnoses: ?Knowledge deficit related to the wound healing center program ?Goals: ?Patient/caregiver will verbalize understanding of the [place] ?Date Initiated: 11/20/2021 ?Target Resolution Date: 11/20/2021 ?Goal Status:  Active ?Interventions: ?Provide education on orientation to the wound center ?Notes: ?Wound/Skin Impairment ?Nursing Diagnoses: ?Impaired tissue integrity ?Knowledge deficit related to ulceration/compromised skin integrity ?Goals: ?Ulcer/skin breakdown will have a volume reduction of 30% by week 4 ?Date Initiated: 11/20/2021 ?Target Resolution Date: 12/18/2021 ?Goal Status: Active ?Ulcer/skin breakdown will have a volume reduction of 50% by week 8 ?Date Initiated: 11/20/2021 ?Target Resolution Date: 01/15/2022 ?Goal Status: Active ?Ulcer/skin breakdown will have a volume reduction of 80% by week 12 ?Date Initiated: 11/20/2021 ?Target Resolution Date: 02/12/2022 ?Goal Status: Active ?Ulcer/skin breakdown will heal within 14 weeks ?Date Initiated: 11/20/2021 ?Target Resolution Date: 02/26/2022 ?Goal Status: Active ?Interventions: ?Assess patient/caregiver ability to obtain necessary supplies ?Assess patient/caregiver ability to perform ulcer/skin care regimen upon admission and as needed ?Assess ulceration(s) every visit ?Provide education on ulcer and skin care ?Notes: ?Electronic Signature(s) ?Signed: 12/04/2021 4:29:09 PM By: Gretta Cool, BSN, RN, CWS, Kim RN, BSN ?Entered By: Gretta Cool, BSN, RN, CWS, Kim on 12/04/2021 09:06:37 ?JONDAVID, SCHREIER ([id number]) ?-------------------------------------------------------------------------------- ?Pain Assessment Details ?Patient Name: [name] ?Date of Service: 12/04/2021 8:15 AM ?Medical Record Number: [id number] ?Patient Account Number: [id number] ?Date of Birth/Sex: December 05, 1965 (57 y.o. M) ?Treating RN: Cornell Barman ?Primary Care Sunil Hue: [name] Other Clinician: ?Referring Deaire Mcwhirter: [name] ?Treating Regino Fournet/Extender: Jeri Cos ?Weeks in Treatment: 2 ?Active Problems ?Location of Pain Severity and Description of Pain ?Patient Has Paino Yes ?Site Locations ?Pain Location: ?Pain in Ulcers ?Rate the pain. ?Current Pain Level: 6 ?Character of Pain ?Describe the Pain:  Shooting, Tender ?Pain Management and Medication ?Current Pain Management: ?Notes ?Patient states wrap slid down and pulls on his big toe ?Electronic Signature(s) ?Signed: 12/04/2021 4:29:09 PM By: Gretta Cool, BSN, RN, CWS, Kim RN, BSN ?Entered By: Gretta Cool

## 2021-12-04 NOTE — Progress Notes (Addendum)
DARELL, SAPUTO ([id number]) ?Visit Report for 12/04/2021 ?Chief Complaint Document Details ?Patient Name: [name] ?Date of Service: 12/04/2021 8:15 AM ?Medical Record Number: [id number] ?Patient Account Number: [id number] ?Date of Birth/Sex: [birth year] (56 y.o. M) ?Treating RN: Cornell Barman ?Primary Care Provider: [name] Other Clinician: ?Referring Provider: [name] ?Treating Provider/Extender: Jeri Cos ?Weeks in Treatment: 2 ?Information Obtained from: Patient ?Chief Complaint ?Bilateral lower leg ulcers ?Electronic Signature(s) ?Signed: 12/04/2021 8:40:54 AM By: Worthy Keeler PA-C ?Entered By: Worthy Keeler on 12/04/2021 08:40:54 ?TRESHUN, WOLD ([id number]) ?-------------------------------------------------------------------------------- ?Debridement Details ?Patient Name: [name] ?Date of Service: 12/04/2021 8:15 AM ?Medical Record Number: [id number] ?Patient Account Number: [id number] ?Date of Birth/Sex: 1966/03/08 (56 y.o. M) ?Treating RN: Cornell Barman ?Primary Care Provider: [name] Other Clinician: ?Referring Provider: [name] ?Treating Provider/Extender: Jeri Cos ?Weeks in Treatment: 2 ?Debridement Performed for ?Wound #3 Right,Medial Lower Leg ?Assessment: ?Performed By: Physician Tommie Sams., PA-C ?Debridement Type: Debridement ?Severity of Tissue Pre Debridement: Fat layer exposed ?Level of Consciousness (Pre- ?Awake and Alert ?procedure): ?Pre-procedure Verification/Time Out ?Yes - 10:10 ?Taken: ?Total Area Debrided (L x W): 3 (cm) x 1 (cm) = 3 (cm?) ?Tissue and other material ?Viable, Non-Viable, Subcutaneous, Fibrin/Exudate ?debrided: ?Level: Skin/Subcutaneous Tissue ?Debridement Description: Excisional ?Instrument: Curette ?Bleeding: Minimum ?Hemostasis Achieved: Pressure ?Response to Treatment: Procedure was tolerated well ?Level of Consciousness (Post- ?Awake and Alert ?procedure): ?Post Debridement Measurements of Total Wound ?Length: (cm) 3 ?Width: (cm)  1 ?Depth: (cm) 0.2 ?Volume: (cm?) 0.471 ?Character of Wound/Ulcer Post Debridement: Stable ?Severity of Tissue Post Debridement: Fat layer exposed ?Post Procedure Diagnosis ?Same as Pre-procedure ?Electronic Signature(s) ?Signed: 12/04/2021 4:29:09 PM By: Gretta Cool, BSN, RN, CWS, Kim RN, BSN ?Signed: 12/04/2021 4:34:33 PM By: Worthy Keeler PA-C ?Entered By: Gretta Cool, BSN, RN, CWS, Kim on 12/04/2021 10:25:11 ?LUCIFER, SOJA ([id number]) ?-------------------------------------------------------------------------------- ?HPI Details ?Patient Name: [name] ?Date of Service: 12/04/2021 8:15 AM ?Medical Record Number: [id number] ?Patient Account Number: [id number] ?Date of Birth/Sex: 19-Feb-1966 (56 y.o. M) ?Treating RN: Cornell Barman ?Primary Care Provider: [name] Other Clinician: ?Referring Provider: [name] ?Treating Provider/Extender: Jeri Cos ?Weeks in Treatment: 2 ?History of Present Illness ?HPI Description: 56 year old male with past medical history of diabetes type 2, HTN, obesity, former smoker, and chronic narcotic use. Present ?today for right lower leg ulcer. He states that he first noticed wound back in October 2019. At that time he felt that he was bitten by something. ?There was no OTC treatment. Over time the wound became larger with increased pain. He was seen by [facility] and started on ?Keflex antibiotics for cellulitis for 14 days. Finished abx 2-3 days ago. Chronic bilateral knee pain. HA1C in November 2019 was 9.6. He is ?independent of ADLs however, he is unable to reach the right leg wound area. Left ABI in clinic is 0.94, right is non-compressible. ?09/11/17 on evaluation today patient has a wound over the right lower extremity which appears to be extremely painful for him. He states at this ?time that the pain is a 10 out of 10 currently. Nonetheless there does not appear to be any signs of infection. He still does not have any insurance ?at this point and therefore a vascular  evaluation is not possible he tells me. Obviously I do think that with his right lower extremity be ?noncompressible we really need to have vascular evaluation order to consider any type of wrap or compressive therapy. ?09/19/18 on evaluation today patient appears to be  doing a little better in regard to his right lateral lower Trinity ulcer. He has been tolerating the ?dressing changes without complication. Fortunately there is no sign of infection at this time. He has been tolerating the dressing changes without ?complication. He does have significant fluid buildup of the right lower extremity but at this point no wraps are being performed as he was ?noncompressible during initial evaluation. He also has not wanted [place] to proceed with a vascular referral for arterial studies due to cost. He does ?not currently have insurance. ?09/28/18 on evaluation today patient appears to be doing well in regard to his right lateral lower extremity ulcer. He's been tolerating the dressing ?changes without complication. Fortunately there is good signs of improvement which is excellent. ?10/10/18 on evaluation today patient appears to be doing rather well in regard to his right lateral lower extremity ulcer. He has noted signs of ?improvement which is good news in fact some of the lower wounds of actually completely closed off which is excellent news. Fortunately there is ?no evidence of infection at this time. ?10/16/18 on evaluation today patient's right lower extremity ulcer actually appears to be showing signs of good improvement which is excellent ?news. He's been tolerating the dressing changes without complication. Fortunately there's no sign of infection at this time. He still has discomfort ?which he states is about the same. ?11/09/18 on evaluation today patient actually appears to be doing rather well in regard to his right lower extremity ulcer. He's been tolerating the ?dressing's without complication. Fortunately there's no  signs of infection at this time. Overall I'm very pleased with how things seem to be ?progressing. ?01/12/19 on evaluation today patient appears to be doing very well in regard to his right lateral lower extremity ulcers. He's been tolerating the ?dressing changes without complication. Fortunately there's no signs of active infection in fact it appears that he may actually be completely ?healed. No fevers, chills, nausea, or vomiting noted at this time. ?Readmission: ?11/20/2021 upon evaluation today patient presents for readmission here in the clinic concerning issues he is having with his bilateral lower ?extremities that he tells me that after I last saw him in 2020 he has not been wearing compression at all since that time that that is "probably ?what got him back in the situation he is in currently". With that being said I did reinforce the fact that yes this is indeed an issue that is going to ?end up with him only getting worse not better if he does not wear compression. For the time being I think we will get a probably need to go ahead ?and utilize compression wraps here in the clinic to try to get this under control. He voiced understanding. ?Patient does have a history of diabetes mellitus type 2 which is absolutely out of control with an A1c of 15.10 September 2021. He also has ?hypertension and is obese. ?12/04/2021 upon evaluation today patient appears to be doing well all things considered in regard to his legs. We are seeing some improvement ?though he still does have significant issues going on here as well. He has a lot of swelling and overall I think that he is going to need continued ?and ongoing use of compression therapy for the time being. ?Electronic Signature(s) ?Signed: 12/04/2021 3:44:07 PM By: Worthy Keeler PA-C ?Entered By: Worthy Keeler on 12/04/2021 15:44:07 ?IRAN, ROWE ([id number]) ?-------------------------------------------------------------------------------- ?Physical Exam  Details ?Patient Name: LEVESTER, WALDRIDGE. ?Date of Service: 12/04/2021 8:15 AM ?Medical  Record Number: [id number] ?Patient Account Number: [id number] ?Date of Birth/Sex: Sep 29, 1965 (56 y.o. M) ?Treating RN: Cornell Barman

## 2021-12-08 ENCOUNTER — Telehealth: Payer: Self-pay

## 2021-12-08 NOTE — Telephone Encounter (Signed)
Attempted call to client for follow up. No answer left VM requesting return call. ? ?[name] RN ?[name] ?

## 2021-12-11 ENCOUNTER — Encounter: Payer: Self-pay | Attending: Physician Assistant | Admitting: Physician Assistant

## 2021-12-11 DIAGNOSIS — L97822 Non-pressure chronic ulcer of other part of left lower leg with fat layer exposed: Secondary | ICD-10-CM | POA: Insufficient documentation

## 2021-12-11 DIAGNOSIS — E11622 Type 2 diabetes mellitus with other skin ulcer: Secondary | ICD-10-CM | POA: Insufficient documentation

## 2021-12-11 DIAGNOSIS — Z79891 Long term (current) use of opiate analgesic: Secondary | ICD-10-CM | POA: Insufficient documentation

## 2021-12-11 DIAGNOSIS — L97812 Non-pressure chronic ulcer of other part of right lower leg with fat layer exposed: Secondary | ICD-10-CM | POA: Insufficient documentation

## 2021-12-11 DIAGNOSIS — I10 Essential (primary) hypertension: Secondary | ICD-10-CM | POA: Insufficient documentation

## 2021-12-11 DIAGNOSIS — Z87891 Personal history of nicotine dependence: Secondary | ICD-10-CM | POA: Insufficient documentation

## 2021-12-11 NOTE — Progress Notes (Addendum)
RIGO, LETTS ([id number]) ?Visit Report for 12/11/2021 ?Chief Complaint Document Details ?Patient Name: [name] ?Date of Service: 12/11/2021 9:00 AM ?Medical Record Number: [id number] ?Patient Account Number: [id number] ?Date of Birth/Sex: July 11, 1966 (56 y.o. M) ?Treating RN: Levora Dredge ?Primary Care Provider: [name] Other Clinician: ?Referring Provider: [name] ?Treating Provider/Extender: Jeri Cos ?Weeks in Treatment: 3 ?Information Obtained from: Patient ?Chief Complaint ?Bilateral lower leg ulcers ?Electronic Signature(s) ?Signed: 12/11/2021 10:03:34 AM By: Worthy Keeler PA-C ?Entered By: Worthy Keeler on 12/11/2021 10:03:34 ?[name], [name] ([id number]) ?-------------------------------------------------------------------------------- ?HPI Details ?Patient Name: [name] ?Date of Service: 12/11/2021 9:00 AM ?Medical Record Number: [id number] ?Patient Account Number: [id number] ?Date of Birth/Sex: 21-Jun-1966 (56 y.o. M) ?Treating RN: Levora Dredge ?Primary Care Provider: [name] Other Clinician: ?Referring Provider: [name] ?Treating Provider/Extender: Jeri Cos ?Weeks in Treatment: 3 ?History of Present Illness ?HPI Description: 56 year old male with past medical history of diabetes type 2, HTN, obesity, former smoker, and chronic narcotic use. Present ?today for right lower leg ulcer. He states that he first noticed wound back in October 2019. At that time he felt that he was bitten by something. ?There was no OTC treatment. Over time the wound became larger with increased pain. He was seen by [facility] urgent care and started on ?Keflex antibiotics for cellulitis for 14 days. Finished abx 2-3 days ago. Chronic bilateral knee pain. HA1C in November 2019 was 9.6. He is ?independent of ADLs however, he is unable to reach the right leg wound area. Left ABI in clinic is 0.94, right is non-compressible. ?09/11/17 on evaluation today patient has a wound over the right lower  extremity which appears to be extremely painful for him. He states at this ?time that the pain is a 10 out of 10 currently. Nonetheless there does not appear to be any signs of infection. He still does not have any insurance ?at this point and therefore a vascular evaluation is not possible he tells me. Obviously I do think that with his right lower extremity be ?noncompressible we really need to have vascular evaluation order to consider any type of wrap or compressive therapy. ?09/19/18 on evaluation today patient appears to be doing a little better in regard to his right lateral lower Trinity ulcer. He has been tolerating the ?dressing changes without complication. Fortunately there is no sign of infection at this time. He has been tolerating the dressing changes without ?complication. He does have significant fluid buildup of the right lower extremity but at this point no wraps are being performed as he was ?noncompressible during initial evaluation. He also has not wanted [place] to proceed with a vascular referral for arterial studies due to cost. He does ?not currently have insurance. ?09/28/18 on evaluation today patient appears to be doing well in regard to his right lateral lower extremity ulcer. He's been tolerating the dressing ?changes without complication. Fortunately there is good signs of improvement which is excellent. ?10/10/18 on evaluation today patient appears to be doing rather well in regard to his right lateral lower extremity ulcer. He has noted signs of ?improvement which is good news in fact some of the lower wounds of actually completely closed off which is excellent news. Fortunately there is ?no evidence of infection at this time. ?10/16/18 on evaluation today patient's right lower extremity ulcer actually appears to be showing signs of good improvement which is excellent ?news. He's been tolerating the dressing changes without complication. Fortunately there's no sign of infection  at this time.  He still has discomfort ?which he states is about the same. ?11/09/18 on evaluation today patient actually appears to be doing rather well in regard to his right lower extremity ulcer. He's been tolerating the ?dressing's without complication. Fortunately there's no signs of infection at this time. Overall I'm very pleased with how things seem to be ?progressing. ?01/12/19 on evaluation today patient appears to be doing very well in regard to his right lateral lower extremity ulcers. He's been tolerating the ?dressing changes without complication. Fortunately there's no signs of active infection in fact it appears that he may actually be completely ?healed. No fevers, chills, nausea, or vomiting noted at this time. ?Readmission: ?11/20/2021 upon evaluation today patient presents for readmission here in the clinic concerning issues he is having with his bilateral lower ?extremities that he tells me that after I last saw him in 2020 he has not been wearing compression at all since that time that that is "probably ?what got him back in the situation he is in currently". With that being said I did reinforce the fact that yes this is indeed an issue that is going to ?end up with him only getting worse not better if he does not wear compression. For the time being I think we will get a probably need to go ahead ?and utilize compression wraps here in the clinic to try to get this under control. He voiced understanding. ?Patient does have a history of diabetes mellitus type 2 which is absolutely out of control with an A1c of 15.10 September 2021. He also has ?hypertension and is obese. ?12/04/2021 upon evaluation today patient appears to be doing well all things considered in regard to his legs. We are seeing some improvement ?though he still does have significant issues going on here as well. He has a lot of swelling and overall I think that he is going to need continued ?and ongoing use of compression therapy for the time  being. ?12-11-2021 upon evaluation today patient actually appears to be completely healed based on what I am seeing. There does not appear to be any ?evidence of active infection locally or systemically which is great news and overall I think that we are definitely headed in the right direction here. ?Electronic Signature(s) ?Signed: 12/11/2021 4:46:04 PM By: Worthy Keeler PA-C ?Entered By: Worthy Keeler on 12/11/2021 16:46:04 ?[name], [name] ([id number]) ?-------------------------------------------------------------------------------- ?Physical Exam Details ?Patient Name: [name] ?Date of Service: 12/11/2021 9:00 AM ?Medical Record Number: [id number] ?Patient Account Number: [id number] ?Date of Birth/Sex: February 10, 1966 (56 y.o. M) ?Treating RN: Levora Dredge ?Primary Care Provider: [name] Other Clinician: ?Referring Provider: [name] ?Treating Provider/Extender: Jeri Cos ?Weeks in Treatment: 3 ?Constitutional ?Well-nourished and well-hydrated in no acute distress. ?Respiratory ?normal breathing without difficulty. ?Psychiatric ?this patient is able to make decisions and demonstrates good insight into disease process. Alert and Oriented x 3. pleasant and cooperative. ?Notes ?Patient's wounds again are completely epithelialized and I do not see any signs of infection which is great news. Overall I think that he has done ?extremely well he needs to be wearing compression socks is going to get his toenails trimmed first I did give him the information for [facility] to try to get this done sooner as what he called previous said they could not see him till June. ?Electronic Signature(s) ?Signed: 12/11/2021 4:46:25 PM By: Worthy Keeler PA-C ?Entered By: Worthy Keeler on 12/11/2021 16:46:25 ?[name], [name] ([id number]) ?-------------------------------------------------------------------------------- ?Physician  Orders Details ?Patient Name: [name] ?Date of Service: 12/11/2021 9:00  AM ?Medical Record Number: [id number] ?Patient Account Number: [id number] ?Date of Birth/Sex: [birth year] (56 y.o. M) ?Treating RN: Levora Dredge ?Primary Care Provider: [name] Other Clinician: ?Referring Prov

## 2021-12-11 NOTE — Progress Notes (Signed)
[name], [name] ([id number]) ?Visit Report for 12/11/2021 ?Arrival Information Details ?Patient Name: [name] ?Date of Service: 12/11/2021 9:00 AM ?Medical Record Number: [id number] ?Patient Account Number: [id number] ?Date of Birth/Sex: 1965-12-09 (56 y.o. M) ?Treating RN: Levora Dredge ?Primary Care Yoltzin Ransom: [name] Other Clinician: ?Referring Kejon Feild: [name] ?Treating Elyssia Strausser/Extender: Jeri Cos ?Weeks in Treatment: 3 ?Visit Information History Since Last Visit ?Added or deleted any medications: No ?Patient Arrived: Ambulatory ?Any new allergies or adverse reactions: No ?Arrival Time: 08:59 ?Had a fall or experienced change in No ?Accompanied By: self ?activities of daily living that may affect ?Transfer Assistance: None ?risk of falls: ?Patient Identification Verified: Yes ?Hospitalized since last visit: No ?Secondary Verification Process Completed: Yes ?Has Dressing in Place as Prescribed: Yes ?Patient Requires Transmission-Based Precautions: No ?Has Compression in Place as Prescribed: Yes ?Patient Has Alerts: Yes ?Pain Present Now: No ?Patient Alerts: Diabetic ?Electronic Signature(s) ?Signed: 12/11/2021 3:13:50 PM By: Levora Dredge ?Entered By: Levora Dredge on 12/11/2021 09:03:57 ?[name], [name] ([id number]) ?-------------------------------------------------------------------------------- ?Clinic Level of Care Assessment Details ?Patient Name: [name] ?Date of Service: 12/11/2021 9:00 AM ?Medical Record Number: [id number] ?Patient Account Number: [id number] ?Date of Birth/Sex: [birth year] (56 y.o. M) ?Treating RN: Levora Dredge ?Primary Care Macala Baldonado: [name] Other Clinician: ?Referring Duel Conrad: [name] ?Treating Rabon Scholle/Extender: Jeri Cos ?Weeks in Treatment: 3 ?Clinic Level of Care Assessment Items ?TOOL 4 Quantity Score ?'[]'$  - Use when only an EandM is performed on FOLLOW-UP visit 0 ?ASSESSMENTS - Nursing Assessment / Reassessment ?'[]'$  - Reassessment of  Co-morbidities (includes updates in patient status) 0 ?X- 1 5 ?Reassessment of Adherence to Treatment Plan ?ASSESSMENTS - Wound and Skin Assessment / Reassessment ?'[]'$  - Simple Wound Assessment / Reassessment - one wound 0 ?X- 3 5 ?Complex Wound Assessment / Reassessment - multiple wounds ?'[]'$  - 0 ?Dermatologic / Skin Assessment (not related to wound area) ?ASSESSMENTS - Focused Assessment ?X - Circumferential Edema Measurements - multi extremities 1 5 ?'[]'$  - 0 ?Nutritional Assessment / Counseling / Intervention ?'[]'$  - 0 ?Lower Extremity Assessment (monofilament, tuning fork, pulses) ?'[]'$  - 0 ?Peripheral Arterial Disease Assessment (using hand held doppler) ?ASSESSMENTS - Ostomy and/or Continence Assessment and Care ?'[]'$  - Incontinence Assessment and Management 0 ?'[]'$  - 0 ?Ostomy Care Assessment and Management (repouching, etc.) ?PROCESS - Coordination of Care ?X - Simple Patient / Family Education for ongoing care 1 15 ?'[]'$  - 0 ?Complex (extensive) Patient / Family Education for ongoing care ?'[]'$  - 0 ?Staff obtains Consents, Records, Test Results / Process Orders ?'[]'$  - 0 ?Staff telephones HHA, Nursing Homes / Clarify orders / etc ?'[]'$  - 0 ?Routine Transfer to another Facility (non-emergent condition) ?'[]'$  - 0 ?Routine Hospital Admission (non-emergent condition) ?'[]'$  - 0 ?New Admissions / Biomedical engineer / Ordering NPWT, Apligraf, etc. ?'[]'$  - 0 ?Emergency Hospital Admission (emergent condition) ?X- 1 10 ?Simple Discharge Coordination ?'[]'$  - 0 ?Complex (extensive) Discharge Coordination ?PROCESS - Special Needs ?'[]'$  - Pediatric / Minor Patient Management 0 ?'[]'$  - 0 ?Isolation Patient Management ?'[]'$  - 0 ?Hearing / Language / Visual special needs ?'[]'$  - 0 ?Assessment of Community assistance (transportation, D/C planning, etc.) ?'[]'$  - 0 ?Additional assistance / Altered mentation ?'[]'$  - 0 ?Support Surface(s) Assessment (bed, cushion, seat, etc.) ?INTERVENTIONS - Wound Cleansing / Measurement ?[name], [name] ([id number]) ?'[]'$  -  0 ?Simple Wound Cleansing - one wound ?X- 3 5 ?Complex Wound Cleansing - multiple wounds ?X- 1 5 ?Wound Imaging (photographs - any number of wounds) ?'[]'$  - 0 ?Wound Tracing (instead of  photographs) ?'[]'$  - 0 ?Simple Wound Measurement - one wound ?X- 3 5 ?Complex Wound Measurement - multiple wounds ?INTERVENTIONS - Wound Dressings ?'[]'$  - Small Wound Dressing one or multiple wounds 0 ?'[]'$  - 0 ?Medium Wound Dressing one or multiple wounds ?'[]'$  - 0 ?Large Wound Dressing one or multiple wounds ?'[]'$  - 0 ?Application of Medications - topical ?'[]'$  - 0 ?Application of Medications - injection ?INTERVENTIONS - Miscellaneous ?'[]'$  - External ear exam 0 ?'[]'$  - 0 ?Specimen Collection (cultures, biopsies, blood, body fluids, etc.) ?'[]'$  - 0 ?Specimen(s) / Culture(s) sent or taken to Lab for analysis ?'[]'$  - 0 ?Patient Transfer (multiple staff / Civil Service fast streamer / Similar devices) ?'[]'$  - 0 ?Simple Staple / Suture removal (25 or less) ?'[]'$  - 0 ?Complex Staple / Suture removal (26 or more) ?'[]'$  - 0 ?Hypo / Hyperglycemic Management (close monitor of Blood Glucose) ?'[]'$  - 0 ?Ankle / Brachial Index (ABI) - do not check if billed separately ?X- 1 5 ?Vital Signs ?Has the patient been seen at the hospital within the last three years: Yes ?Total Score: 90 ?Level Of Care: New/Established - Level ?3 ?Electronic Signature(s) ?Signed: 12/11/2021 3:13:50 PM By: Levora Dredge ?Entered By: Levora Dredge on 12/11/2021 10:42:20 ?[name], [name] ([id number]) ?-------------------------------------------------------------------------------- ?Encounter Discharge Information Details ?Patient Name: [name] ?Date of Service: 12/11/2021 9:00 AM ?Medical Record Number: [id number] ?Patient Account Number: [id number] ?Date of Birth/Sex: 11/26/1965 (56 y.o. M) ?Treating RN: Levora Dredge ?Primary Care Katharin Schneider: [name] Other Clinician: ?Referring Rylei Masella: [name] ?Treating Obie Silos/Extender: Jeri Cos ?Weeks in Treatment: 3 ?Encounter Discharge Information  Items ?Discharge Condition: Stable ?Ambulatory Status: Ambulatory ?Discharge Destination: Home ?Transportation: Private Auto ?Accompanied By: son ?Schedule Follow-up Appointment: No ?Clinical Summary of Care: Patient Declined ?Electronic Signature(s) ?Signed: 12/11/2021 10:44:18 AM By: Levora Dredge ?Entered By: Levora Dredge on 12/11/2021 10:44:18 ?[name], [name] ([id number]) ?-------------------------------------------------------------------------------- ?Lower Extremity Assessment Details ?Patient Name: [name] ?Date of Service: 12/11/2021 9:00 AM ?Medical Record Number: [id number] ?Patient Account Number: [id number] ?Date of Birth/Sex: 10-09-1965 (56 y.o. M) ?Treating RN: Levora Dredge ?Primary Care Keiera Strathman: [name] Other Clinician: ?Referring Leona Pressly: [name] ?Treating Nakia Koble/Extender: Jeri Cos ?Weeks in Treatment: 3 ?Edema Assessment ?Assessed: [Left: No] [Right: No] ?Edema: [Left: Yes] [Right: Yes] ?Calf ?Left: Right: ?Point of Measurement: 36 cm From Medial Instep 48 cm 44 cm ?Ankle ?Left: Right: ?Point of Measurement: 10 cm From Medial Instep 25 cm 26 cm ?Vascular Assessment ?Pulses: ?Dorsalis Pedis ?Palpable: [Left:Yes] [Right:Yes] ?Electronic Signature(s) ?Signed: 12/11/2021 3:13:50 PM By: Levora Dredge ?Entered By: Levora Dredge on 12/11/2021 09:24:33 ?[name], [name] ([id number]) ?-------------------------------------------------------------------------------- ?Multi Wound Chart Details ?Patient Name: [name] ?Date of Service: 12/11/2021 9:00 AM ?Medical Record Number: [id number] ?Patient Account Number: [id number] ?Date of Birth/Sex: March 12, 1966 (56 y.o. M) ?Treating RN: Levora Dredge ?Primary Care Tae Robak: [name] Other Clinician: ?Referring Etrulia Zarr: [name] ?Treating Lenville Hibberd/Extender: Jeri Cos ?Weeks in Treatment: 3 ?Vital Signs ?Height(in): 68 ?Pulse(bpm): 100 ?Weight(lbs): 360 ?Blood Pressure(mmHg): 167/104 ?Body Mass Index(BMI):  54.7 ?Temperature(??F): 97.5 ?Respiratory Rate(breaths/min): 18 ?Photos: ?Wound Location: Right, Lateral Lower Leg Right, Medial Lower Leg Left Lower Leg ?Wounding Event: Gradually Appeared Gradually Appeared Gradually Appeared ?Prima

## 2021-12-17 ENCOUNTER — Ambulatory Visit: Payer: Self-pay | Admitting: Physician Assistant

## 2021-12-17 DIAGNOSIS — K0889 Other specified disorders of teeth and supporting structures: Secondary | ICD-10-CM

## 2021-12-17 MED ORDER — AMOXICILLIN 500 MG PO CAPS: 500.0000 mg | ORAL_CAPSULE | Freq: Three times a day (TID) | ORAL | 0 refills | Status: AC

## 2021-12-17 NOTE — Progress Notes (Signed)
? ?  There were no vitals taken for this visit.  ? ?Subjective:  ? ? Patient ID: [name], male    DOB: 1966/02/19, 56 y.o.   MRN: [id number] ? ?HPI: ?[name] is a 56 y.o. male presenting on 12/17/2021 for No chief complaint on file. ? ? ?HPI ? ? ?This is a telemedicine appointment via Telephone ? ? ?I connected with  [name] on 12/17/21 by a video enabled telemedicine application and verified that I am speaking with the correct person using two identifiers. ?  ?I discussed the limitations of evaluation and management by telemedicine. The patient expressed understanding and agreed to proceed. ? ? ?Pt is at home.  Provider is at office. ? ? ? ?Pt c/o Toothache that started this past weekend.  He says it is decayed but does not have pus.  He is not having swelling of his face.   ? ? ? ?Relevant past medical, surgical, family and social history reviewed and updated as indicated. Interim medical history since our last visit reviewed. ?Allergies and medications reviewed and updated. ? ?Review of Systems ? ?Per HPI unless specifically indicated above ? ?   ?Objective:  ?  ?There were no vitals taken for this visit.  ?Wt Readings from Last 3 Encounters:  ?11/24/21 (!) 365 lb (165.6 kg)  ?11/18/21 (!) 362 lb (164.2 kg)  ?10/29/21 (!) 363 lb (164.7 kg)  ?  ?Physical Exam ?Pulmonary:  ?   Effort: No respiratory distress.  ?   Comments: Pt is talking in complete sentences without dyspnea ?Neurological:  ?   Mental Status: He is alert and oriented to person, place, and time.  ?Psychiatric:     ?   Speech: Speech normal.  ? ? ? ? ? ? ? ?   ?Assessment & Plan:  ? ?Encounter Diagnosis  ?Name Primary?  ? Dentalgia Yes  ? ? ? ? ?Rx amoxil ?Pt is put on dental list ?Follow up as scheduled.  Contact office sooner prn ? ? ? ?

## 2021-12-29 ENCOUNTER — Telehealth: Payer: Self-pay

## 2021-12-29 ENCOUNTER — Ambulatory Visit: Payer: Self-pay | Admitting: Licensed Clinical Social Worker

## 2021-12-29 MED ORDER — OXYCODONE-ACETAMINOPHEN 10-325 MG PO TABS: 1.0000 | ORAL_TABLET | ORAL | 0 refills | Status: DC | PRN

## 2021-12-29 NOTE — Telephone Encounter (Signed)
Oxycodone-Acetaminophen 10/325 MG Qty 170 Tablets   PATIENT USES [facility] 

## 2021-12-30 ENCOUNTER — Telehealth: Payer: Self-pay | Admitting: "Endocrinology

## 2021-12-30 DIAGNOSIS — E1165 Type 2 diabetes mellitus with hyperglycemia: Secondary | ICD-10-CM

## 2021-12-30 MED ORDER — TRULICITY 1.5 MG/0.5ML ~~LOC~~ SOAJ: 1.5000 mg | SUBCUTANEOUS | 0 refills | Status: DC

## 2021-12-30 NOTE — Telephone Encounter (Signed)
Patient is asking for a refill on his Trulicity through [facility]. He is not sure if we order this or if he does. Please let pt know  ?

## 2021-12-30 NOTE — Telephone Encounter (Signed)
Rx sent 

## 2022-01-12 ENCOUNTER — Ambulatory Visit: Payer: Self-pay | Admitting: Licensed Clinical Social Worker

## 2022-01-12 ENCOUNTER — Ambulatory Visit (INDEPENDENT_AMBULATORY_CARE_PROVIDER_SITE_OTHER): Payer: Self-pay | Admitting: Orthopaedic Surgery

## 2022-01-12 ENCOUNTER — Encounter: Payer: Self-pay | Admitting: Orthopaedic Surgery

## 2022-01-12 ENCOUNTER — Other Ambulatory Visit: Payer: Self-pay | Admitting: Physician Assistant

## 2022-01-12 DIAGNOSIS — I10 Essential (primary) hypertension: Secondary | ICD-10-CM

## 2022-01-12 DIAGNOSIS — G8929 Other chronic pain: Secondary | ICD-10-CM

## 2022-01-12 DIAGNOSIS — E1165 Type 2 diabetes mellitus with hyperglycemia: Secondary | ICD-10-CM

## 2022-01-12 DIAGNOSIS — Z6841 Body Mass Index (BMI) 40.0 and over, adult: Secondary | ICD-10-CM

## 2022-01-12 DIAGNOSIS — E785 Hyperlipidemia, unspecified: Secondary | ICD-10-CM

## 2022-01-12 DIAGNOSIS — M25562 Pain in left knee: Secondary | ICD-10-CM

## 2022-01-12 DIAGNOSIS — M25561 Pain in right knee: Secondary | ICD-10-CM

## 2022-01-12 NOTE — Progress Notes (Signed)
Virtual Visit via Telephone Note ? ?I connected withNAME@ on 01/12/22 at  2:00 PM EDT by telephone and verified that I am speaking with the correct person using two identifiers. ? ?Location: ?Patient: home ?Provider: office ?  ?I discussed the limitations, risks, security and privacy concerns of performing an evaluation and management service by telephone and the availability of in person appointments. I also discussed with the patient that there may be a patient responsible charge related to this service. The patient expressed understanding and agreed to proceed. ? ? ?History of Present Illness: ?His knees are more painful.  He has no new trauma.  He is taking his pain medicine.  He has no giving way.  He has swelling and popping. ?  ?Observations/Objective: ?Per above. ? ?Assessment and Plan: ?Encounter Diagnoses  ?Name Primary?  ? Chronic pain of right knee Yes  ? Chronic pain of left knee   ? Morbid obesity due to excess calories ([place])   ? Body mass index 60.0-69.9, adult ([place])   ? ? ? ?Follow Up Instructions: ?See in three months.  Call for refill when due. ?  ?I discussed the assessment and treatment plan with the patient. The patient was provided an opportunity to ask questions and all were answered. The patient agreed with the plan and demonstrated an understanding of the instructions. ?  ?The patient was advised to call back or seek an in-person evaluation if the symptoms worsen or if the condition fails to improve as anticipated. ? ?I provided 8 minutes of non-face-to-face time during this encounter. ? ? ?Sanjuana Kava, MD ? ?

## 2022-01-14 ENCOUNTER — Ambulatory Visit: Payer: Self-pay | Admitting: Urology

## 2022-01-21 ENCOUNTER — Ambulatory Visit (INDEPENDENT_AMBULATORY_CARE_PROVIDER_SITE_OTHER): Payer: Self-pay | Admitting: Urology

## 2022-01-21 ENCOUNTER — Encounter: Payer: Self-pay | Admitting: Urology

## 2022-01-21 VITALS — BP 138/86 | HR 109 | Ht 68.0 in | Wt 350.6 lb

## 2022-01-21 DIAGNOSIS — R350 Frequency of micturition: Secondary | ICD-10-CM

## 2022-01-21 DIAGNOSIS — N4883 Acquired buried penis: Secondary | ICD-10-CM

## 2022-01-21 DIAGNOSIS — E230 Hypopituitarism: Secondary | ICD-10-CM

## 2022-01-21 DIAGNOSIS — N481 Balanitis: Secondary | ICD-10-CM

## 2022-01-21 DIAGNOSIS — R351 Nocturia: Secondary | ICD-10-CM

## 2022-01-21 DIAGNOSIS — N471 Phimosis: Secondary | ICD-10-CM

## 2022-01-21 DIAGNOSIS — E669 Obesity, unspecified: Secondary | ICD-10-CM

## 2022-01-21 DIAGNOSIS — E119 Type 2 diabetes mellitus without complications: Secondary | ICD-10-CM

## 2022-01-21 DIAGNOSIS — R81 Glycosuria: Secondary | ICD-10-CM

## 2022-01-21 LAB — URINALYSIS, ROUTINE W REFLEX MICROSCOPIC
Bilirubin, UA: NEGATIVE
Ketones, UA: NEGATIVE
Leukocytes,UA: NEGATIVE
Nitrite, UA: NEGATIVE
Protein,UA: NEGATIVE
RBC, UA: NEGATIVE
Specific Gravity, UA: <1.005 — ABNORMAL LOW (ref 1.005–1.030)
Urobilinogen, Ur: 0.2 mg/dL (ref 0.2–1.0)
pH, UA: 5.5 (ref 5.0–7.5)

## 2022-01-21 MED ORDER — NYSTATIN 100000 UNIT/GM EX CREA: 1.0000 "application " | TOPICAL_CREAM | Freq: Two times a day (BID) | CUTANEOUS | 3 refills | Status: DC

## 2022-01-21 NOTE — Progress Notes (Signed)
Subjective: 1. Acquired buried penis   2. Balanitis   3. Nocturia   4. Urinary frequency   5. Hypogonadotropic hypogonadism ([name])   6. Glucosuria     01/21/22: [name] returns today in f/u.  He has lost 30lb with diet but his glucose was 335 yesterday so he is still having frequency and nocturia.  He still has 3+ glucose in the urine today.   He has been using nystatin cream and needs a refill.  He didn't get the testosterone level that was ordered before and hasn't been able to get to [facility] to see the reconstructive urologist.    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  His PSA on 07/07/21 was stable at 0.26.   He is a very poorly controlled diabetic with a glucose of 539  and Hgb A1c of >15.5 on 09/28/21.  He hasn't had a recent testosterone level.   He is concerned today about a retracted penis and having to sit to void.   He is losing weight.  He has frequency and urgency with nocturia that is probably secondary to polydipsia from the diabetes.   UA has 3+ glucose but is otherwise clear.  ROS:  ROS  No Known Allergies  Past Medical History:  Diagnosis Date   Arthritis    Diabetes mellitus without complication ([place])    diagnosed about age 48   Gout    Hypertension    boarderline    Past Surgical History:  Procedure Laterality Date   HERNIA REPAIR  2008   MASS EXCISION Left age 39   L thigh- benign    Social History   Socioeconomic History   Marital status: Single    Spouse name: Not on file   Number of children: Not on file   Years of education: Not on file   Highest education level: Not on file  Occupational History   Not on file  Tobacco Use   Smoking status: Former    Packs/day: 0.50    Years: 5.00    Pack years: 2.50    Types: Cigarettes    Quit date: 48    Years since quitting: 33.3   Smokeless tobacco: Never  Vaping Use   Vaping Use: Never used  Substance and Sexual Activity    Alcohol use: No   Drug use: No   Sexual activity: Not on file  Other Topics Concern   Not on file  Social History Narrative   Not on file   Social Determinants of Health   Financial Resource Strain: Not on file  Food Insecurity: Not on file  Transportation Needs: Not on file  Physical Activity: Not on file  Stress: Not on file  Social Connections: Not on file  Intimate Partner Violence: Not on file    Family History  Problem Relation Age of Onset   Cancer Mother    Diabetes Mother    Heart failure Father    Hypertension Father    Diabetes Father    Heart attack Father     Anti-infectives: Anti-infectives (From admission, onward)    None       Current Outpatient Medications  Medication Sig Dispense Refill   allopurinol (ZYLOPRIM) 300 MG tablet Take 1 tablet (300 mg total) by mouth daily. 30 tablet 5   amLODipine (NORVASC) 5 MG tablet Take 1 tablet (5 mg total) by mouth daily. 90 tablet 0  atorvastatin (LIPITOR) 40 MG tablet Take 1 tablet (40 mg total) by mouth daily. 90 tablet 1   cyclobenzaprine (FLEXERIL) 10 MG tablet Take 1 tablet (10 mg total) by mouth 3 (three) times daily as needed for muscle spasms. 30 tablet 0   Dulaglutide (TRULICITY) 1.5 ZM/6.2HU SOPN Inject 1.5 mg into the skin once a week. 3 mL 0   furosemide (LASIX) 20 MG tablet Take 1 tablet (20 mg total) by mouth daily. 30 tablet 1   glipiZIDE (GLUCOTROL XL) 5 MG 24 hr tablet Take 1 tablet (5 mg total) by mouth daily with breakfast. 90 tablet 3   insulin glargine (LANTUS) 100 UNIT/ML injection Inject 0.8 mLs (80 Units total) into the skin at bedtime. 30 mL 3   Insulin Glulisine (APIDRA SOLOSTAR [place]) Inject 16-22 Units into the skin in the morning, at noon, and at bedtime.     lisinopril (ZESTRIL) 20 MG tablet Take 1 tablet (20 mg total) by mouth daily. 90 tablet 0   metFORMIN (GLUCOPHAGE XR) 500 MG 24 hr tablet Take 2 tablets (1,000 mg total) by mouth daily with breakfast. 90 tablet 1   metoprolol  tartrate (LOPRESSOR) 100 MG tablet Take 1 tablet (100 mg total) by mouth 2 (two) times daily. 180 tablet 0   oxyCODONE-acetaminophen (PERCOCET) 10-325 MG tablet Take 1 tablet by mouth every 4 (four) hours as needed for pain. 170 tablet 0   nystatin cream (MYCOSTATIN) Apply 1 application. topically 2 (two) times daily. To penile irritation. 30 g 3   No current facility-administered medications for this visit.     Objective: Vital signs in last 24 hours: BP 138/86 (BP Location: Right Arm, Patient Position: Sitting, Cuff Size: Large)   Pulse (!) 109   Ht '5\' 8"'$  (1.727 m)   Wt (!) 350 lb 9.6 oz (159 kg)   BMI 53.31 kg/m   Intake/Output from previous day: No intake/output data recorded. Intake/Output this shift: '@IOTHISSHIFT'$ @   Physical Exam  Lab Results:  Results for orders placed or performed in visit on 01/21/22 (from the past 24 hour(s))  Urinalysis, Routine w reflex microscopic     Status: Abnormal   Collection Time: 01/21/22  4:08 PM  Result Value Ref Range   Specific Gravity, UA <1.005 (L) 1.005 - 1.030   pH, UA 5.5 5.0 - 7.5   Color, UA Yellow Yellow   Appearance Ur Clear Clear   Leukocytes,UA Negative Negative   Protein,UA Negative Negative/Trace   Glucose, UA 3+ (A) Negative   Ketones, UA Negative Negative   RBC, UA Negative Negative   Bilirubin, UA Negative Negative   Urobilinogen, Ur 0.2 0.2 - 1.0 mg/dL   Nitrite, UA Negative Negative   Microscopic Examination Comment    Narrative   Performed at:  [facility] [address] [place], [place]  [id number] Lab Director: [place], Phone:  [phone]      BMET No results for input(s): NA, K, CL, CO2, GLUCOSE, BUN, CREATININE, CALCIUM in the last 72 hours. PT/INR No results for input(s): LABPROT, INR in the last 72 hours. ABG No results for input(s): PHART, HCO3 in the last 72 hours.  Invalid input(s): PCO2, PO2  Studies/Results: No results found.   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   I have refilled the nystatin cream and he is trying to get financial assistance to get to Dr. Francesca Jewett at [facility].    Hypogonadism.   I will have him  get a testosterone panel on Monday when he gets routine labs. If the low testosterone is confirmed, he might benefit metabolically from replacement therapy.     Poorly controlled DM and obesity.  He has lost about 30lb with diet but still has 3+ glucose in the urine today which is certainly contributing to the frequency and nocturia.     Meds ordered this encounter  Medications   nystatin cream (MYCOSTATIN)    Sig: Apply 1 application. topically 2 (two) times daily. To penile irritation.    Dispense:  30 g    Refill:  3     Orders Placed This Encounter  Procedures   Urinalysis, Routine w reflex microscopic   Testosterone Free, Profile I    He could have that done on Monday when he gets labs drawn at [facility] for his PCP.    Standing Status:   Future    Standing Expiration Date:   01/22/2023     Return in about 6 months (around 07/24/2022).    CC: [name] PA.      Irine Seal 01/21/2022 [phone]

## 2022-01-26 ENCOUNTER — Other Ambulatory Visit: Payer: Self-pay

## 2022-01-26 MED ORDER — OXYCODONE-ACETAMINOPHEN 10-325 MG PO TABS: 1.0000 | ORAL_TABLET | ORAL | 0 refills | Status: DC | PRN

## 2022-01-26 NOTE — Telephone Encounter (Signed)
Oxycodone-Acetaminophen 10/325 MG    Take 1 tablet by mouth every 4 (four) hours as needed for pain    PATIENT USES [facility]

## 2022-01-27 ENCOUNTER — Ambulatory Visit: Payer: Self-pay | Admitting: Physician Assistant

## 2022-02-02 ENCOUNTER — Other Ambulatory Visit: Payer: Self-pay

## 2022-02-02 ENCOUNTER — Other Ambulatory Visit (HOSPITAL_COMMUNITY)
Admission: RE | Admit: 2022-02-02 | Discharge: 2022-02-02 | Disposition: A | Discharge status: Home / Self Care | Payer: Self-pay | Source: Ambulatory Visit | Attending: Physician Assistant | Admitting: Physician Assistant

## 2022-02-02 ENCOUNTER — Other Ambulatory Visit (HOSPITAL_COMMUNITY)
Admission: RE | Admit: 2022-02-02 | Discharge: 2022-02-02 | Disposition: A | Discharge status: Home / Self Care | Payer: Self-pay | Source: Ambulatory Visit | Attending: Urology | Admitting: Urology

## 2022-02-02 DIAGNOSIS — E230 Hypopituitarism: Secondary | ICD-10-CM | POA: Insufficient documentation

## 2022-02-02 DIAGNOSIS — E785 Hyperlipidemia, unspecified: Secondary | ICD-10-CM | POA: Insufficient documentation

## 2022-02-02 DIAGNOSIS — I10 Essential (primary) hypertension: Secondary | ICD-10-CM | POA: Insufficient documentation

## 2022-02-02 DIAGNOSIS — E1165 Type 2 diabetes mellitus with hyperglycemia: Secondary | ICD-10-CM | POA: Insufficient documentation

## 2022-02-02 LAB — LIPID PANEL
Cholesterol: 289 mg/dL — ABNORMAL HIGH (ref 0–200)
HDL: 44 mg/dL (ref >40)
LDL Cholesterol: 193 mg/dL — ABNORMAL HIGH (ref 0–99)
Total CHOL/HDL Ratio: 6.6 RATIO
Triglycerides: 261 mg/dL — ABNORMAL HIGH (ref <150)
VLDL: 52 mg/dL — ABNORMAL HIGH (ref 0–40)

## 2022-02-02 LAB — COMPREHENSIVE METABOLIC PANEL
ALT: 26 U/L (ref 0–44)
AST: 22 U/L (ref 15–41)
Albumin: 3.4 g/dL — ABNORMAL LOW (ref 3.5–5.0)
Alkaline Phosphatase: 189 U/L — ABNORMAL HIGH (ref 38–126)
Anion gap: 9 (ref 5–15)
BUN: 14 mg/dL (ref 6–20)
CO2: 25 mmol/L (ref 22–32)
Calcium: 9.2 mg/dL (ref 8.9–10.3)
Chloride: 98 mmol/L (ref 98–111)
Creatinine, Ser: 0.8 mg/dL (ref 0.61–1.24)
GFR, Estimated: >60 mL/min (ref >60)
Glucose, Bld: 444 mg/dL — ABNORMAL HIGH (ref 70–99)
Potassium: 4.9 mmol/L (ref 3.5–5.1)
Sodium: 132 mmol/L — ABNORMAL LOW (ref 135–145)
Total Bilirubin: 0.7 mg/dL (ref 0.3–1.2)
Total Protein: 7.6 g/dL (ref 6.5–8.1)

## 2022-02-02 MED ORDER — OXYCODONE-ACETAMINOPHEN 10-325 MG PO TABS: 1.0000 | ORAL_TABLET | ORAL | 0 refills | Status: DC | PRN

## 2022-02-02 NOTE — Telephone Encounter (Signed)
Oxycodone-Acetaminophen 10/325 MG  Qty 42 Tablets  Take 1 tablet by mouth every 4 (four) hours as needed for up to 7 days for pain.  PATIENT USES [facility]

## 2022-02-03 ENCOUNTER — Ambulatory Visit: Payer: Self-pay | Admitting: Physician Assistant

## 2022-02-03 ENCOUNTER — Encounter: Payer: Self-pay | Admitting: Physician Assistant

## 2022-02-03 VITALS — BP 106/78 | HR 109 | Temp 97.0°F | Wt 350.0 lb

## 2022-02-03 DIAGNOSIS — E785 Hyperlipidemia, unspecified: Secondary | ICD-10-CM

## 2022-02-03 DIAGNOSIS — F509 Eating disorder, unspecified: Secondary | ICD-10-CM

## 2022-02-03 DIAGNOSIS — I10 Essential (primary) hypertension: Secondary | ICD-10-CM

## 2022-02-03 DIAGNOSIS — F419 Anxiety disorder, unspecified: Secondary | ICD-10-CM

## 2022-02-03 DIAGNOSIS — E1165 Type 2 diabetes mellitus with hyperglycemia: Secondary | ICD-10-CM

## 2022-02-03 DIAGNOSIS — G8929 Other chronic pain: Secondary | ICD-10-CM

## 2022-02-03 DIAGNOSIS — I87303 Chronic venous hypertension (idiopathic) without complications of bilateral lower extremity: Secondary | ICD-10-CM

## 2022-02-03 DIAGNOSIS — R7989 Other specified abnormal findings of blood chemistry: Secondary | ICD-10-CM

## 2022-02-03 LAB — MISC LABCORP TEST (SEND OUT): Labcorp test code: 140226

## 2022-02-03 MED ORDER — LISINOPRIL 20 MG PO TABS: 20.0000 mg | ORAL_TABLET | Freq: Every day | ORAL | 0 refills | Status: DC

## 2022-02-03 MED ORDER — METOPROLOL TARTRATE 100 MG PO TABS: 100.0000 mg | ORAL_TABLET | Freq: Two times a day (BID) | ORAL | 0 refills | Status: DC

## 2022-02-03 MED ORDER — AMLODIPINE BESYLATE 5 MG PO TABS: 5.0000 mg | ORAL_TABLET | Freq: Every day | ORAL | 0 refills | Status: DC

## 2022-02-03 MED ORDER — ATORVASTATIN CALCIUM 80 MG PO TABS: 80.0000 mg | ORAL_TABLET | Freq: Every day | ORAL | 0 refills | Status: DC

## 2022-02-03 NOTE — Progress Notes (Unsigned)
BP 106/78   Pulse (!) 109   Temp (!) 97 F (36.1 C)   Wt (!) 350 lb (158.8 kg)   SpO2 94%   BMI 53.22 kg/m    Subjective:    Patient ID: [name], male    DOB: 1965-10-03, 56 y.o.   MRN: [id number]  HPI: [name] is a 56 y.o. male presenting on 02/03/2022 for Hypertension and Hyperlipidemia   HPI   Chief Complaint  Patient presents with   Hypertension   Hyperlipidemia    Pt says He wants to stop lantus and apidra and go back to taking 70/30 insulin.  He has appt with endocrine 02/23/22.   He says "it's the spasms that are unbearable" and the reason he wants to change insulin.  His most recent a1c in January was > 15.5.   discussed with pt that his uncontrolled DM is likely cause of his weight loss and this is a very unhealthy way to lose weight.  Discussed that his ongoing uncontrolled DM is damaging his body and his organs.   He says wound doctor released him.  He had been referred due to LE ulcerations.   He is still having eating disorder but is eating smaller portions.  He switched to diet sodas.  He has no new issues today.       Relevant past medical, surgical, family and social history reviewed and updated as indicated. Interim medical history since our last visit reviewed. Allergies and medications reviewed and updated.   Current Outpatient Medications:    allopurinol (ZYLOPRIM) 300 MG tablet, Take 1 tablet (300 mg total) by mouth daily. (Patient taking differently: Take 300 mg by mouth daily as needed.), Disp: 30 tablet, Rfl: 5   amLODipine (NORVASC) 5 MG tablet, Take 1 tablet (5 mg total) by mouth daily., Disp: 90 tablet, Rfl: 0   atorvastatin (LIPITOR) 40 MG tablet, Take 1 tablet (40 mg total) by mouth daily., Disp: 90 tablet, Rfl: 1   cyclobenzaprine (FLEXERIL) 10 MG tablet, Take 1 tablet (10 mg total) by mouth 3 (three) times daily as needed for muscle spasms., Disp: 30 tablet, Rfl: 0   Dulaglutide (TRULICITY) 1.5 FW/2.6VZ SOPN, Inject 1.5 mg into  the skin once a week., Disp: 3 mL, Rfl: 0   furosemide (LASIX) 20 MG tablet, Take 1 tablet (20 mg total) by mouth daily., Disp: 30 tablet, Rfl: 1   glipiZIDE (GLUCOTROL XL) 5 MG 24 hr tablet, Take 1 tablet (5 mg total) by mouth daily with breakfast., Disp: 90 tablet, Rfl: 3   insulin glargine (LANTUS) 100 UNIT/ML injection, Inject 0.8 mLs (80 Units total) into the skin at bedtime., Disp: 30 mL, Rfl: 3   Insulin Glulisine (APIDRA SOLOSTAR [place]), Inject 16-22 Units into the skin in the morning, at noon, and at bedtime., Disp: , Rfl:    lisinopril (ZESTRIL) 20 MG tablet, Take 1 tablet (20 mg total) by mouth daily., Disp: 90 tablet, Rfl: 0   metFORMIN (GLUCOPHAGE XR) 500 MG 24 hr tablet, Take 2 tablets (1,000 mg total) by mouth daily with breakfast. (Patient taking differently: Take 1,000 mg by mouth in the morning and at bedtime.), Disp: 90 tablet, Rfl: 1   metoprolol tartrate (LOPRESSOR) 100 MG tablet, Take 1 tablet (100 mg total) by mouth 2 (two) times daily., Disp: 180 tablet, Rfl: 0   nystatin cream (MYCOSTATIN), Apply 1 application. topically 2 (two) times daily. To penile irritation., Disp: 30 g, Rfl: 3   oxyCODONE-acetaminophen (PERCOCET) 10-325  MG tablet, Take 1 tablet by mouth every 4 (four) hours as needed for up to 7 days for pain., Disp: 42 tablet, Rfl: 0     Review of Systems  Per HPI unless specifically indicated above     Objective:    BP 106/78   Pulse (!) 109   Temp (!) 97 F (36.1 C)   Wt (!) 350 lb (158.8 kg)   SpO2 94%   BMI 53.22 kg/m   Wt Readings from Last 3 Encounters:  02/03/22 (!) 350 lb (158.8 kg)  01/21/22 (!) 350 lb 9.6 oz (159 kg)  11/24/21 (!) 365 lb (165.6 kg)    Physical Exam Vitals reviewed.  Constitutional:      General: He is not in acute distress.    Appearance: He is well-developed. He is obese. He is not toxic-appearing.  HENT:     Head: Normocephalic and atraumatic.  Cardiovascular:     Rate and Rhythm: Normal rate and regular rhythm.   Pulmonary:     Effort: Pulmonary effort is normal.     Breath sounds: Normal breath sounds. No wheezing.  Abdominal:     General: Bowel sounds are normal.     Palpations: Abdomen is soft.     Tenderness: There is no abdominal tenderness.  Musculoskeletal:     Cervical back: Neck supple.     Right lower leg: Edema present.     Left lower leg: Edema present.     Comments: RLE with thick scab over his chronic wound.  No purulence.  No tenderness.   Feet:     Comments: Thick calluses bilaterally.  No open wounds. DM foot exam updated. Lymphadenopathy:     Cervical: No cervical adenopathy.  Skin:    General: Skin is warm and dry.  Neurological:     Mental Status: He is alert and oriented to person, place, and time.  Psychiatric:        Attention and Perception: Attention normal.        Speech: Speech normal.        Behavior: Behavior normal. Behavior is cooperative.    Results for orders placed or performed during the hospital encounter of 02/02/22  Lipid panel  Result Value Ref Range   Cholesterol 289 (H) 0 - 200 mg/dL   Triglycerides 261 (H) <150 mg/dL   HDL 44 >40 mg/dL   Total CHOL/HDL Ratio 6.6 RATIO   VLDL 52 (H) 0 - 40 mg/dL   LDL Cholesterol 193 (H) 0 - 99 mg/dL  Comprehensive metabolic panel  Result Value Ref Range   Sodium 132 (L) 135 - 145 mmol/L   Potassium 4.9 3.5 - 5.1 mmol/L   Chloride 98 98 - 111 mmol/L   CO2 25 22 - 32 mmol/L   Glucose, Bld 444 (H) 70 - 99 mg/dL   BUN 14 6 - 20 mg/dL   Creatinine, Ser 0.80 0.61 - 1.24 mg/dL   Calcium 9.2 8.9 - 10.3 mg/dL   Total Protein 7.6 6.5 - 8.1 g/dL   Albumin 3.4 (L) 3.5 - 5.0 g/dL   AST 22 15 - 41 U/L   ALT 26 0 - 44 U/L   Alkaline Phosphatase 189 (H) 38 - 126 U/L   Total Bilirubin 0.7 0.3 - 1.2 mg/dL   GFR, Estimated >60 >60 mL/min   Anion gap 9 5 - 15      Assessment & Plan:    Encounter Diagnoses  Name Primary?   Hyperlipidemia, unspecified hyperlipidemia type  Yes   Essential hypertension     Uncontrolled type 2 diabetes mellitus with hyperglycemia (HCC)    Morbid obesity (HCC)    Chronic pain of both knees    Eating disorder, unspecified type    Anxiety    Stasis edema of both lower extremities    Elevated LFTs      -reviewed labs with pt  -pt encouraged to contact [facility] to get help applying for financial assistance including at [facility] -pt to continue with endocrinology as scheduled.  He is told that he needs to discuss his insulin options with his endocrinologist as they are managing his DM.  He states understanding -pt is trying to get appointment with Urology at [facility] as referred by local urologist -pt is Still sees orthopedics for his chronic knee DJD -Increase atorvastatin. will moitor lfts.  Discussed risks and benefits and pt needs his lipids improved and he agrees.  he takes 4 or 5 percocet daily.   He is encouraged to make sure his orthopedist is aware of his LFTs -DM foot exam updated -He has DM eye exam in August -Encouraged pt to reschedule with [facility]/counselor.  He declined to be seen today or schedule appointment at this time -pt to follow up 3 months.  He is to contact office sooner prn

## 2022-02-09 ENCOUNTER — Telehealth: Payer: Self-pay | Admitting: Orthopaedic Surgery

## 2022-02-09 MED ORDER — OXYCODONE-ACETAMINOPHEN 10-325 MG PO TABS: 1.0000 | ORAL_TABLET | ORAL | 0 refills | Status: DC | PRN

## 2022-02-09 NOTE — Telephone Encounter (Signed)
Patient called for refill: oxyCODONE-acetaminophen (PERCOCET) 10-325 MG tablet       [facility], [address]

## 2022-02-16 ENCOUNTER — Telehealth: Payer: Self-pay | Admitting: Orthopaedic Surgery

## 2022-02-16 MED ORDER — OXYCODONE-ACETAMINOPHEN 10-325 MG PO TABS: 1.0000 | ORAL_TABLET | ORAL | 0 refills | Status: DC | PRN

## 2022-02-16 NOTE — Telephone Encounter (Signed)
Per phone with patient today, due to 'no pharmacy within a 20+mile radius has his medication which was originally ordered at [facility] - has called to ask for   'partial prescritpion' refill request to be sent to [facility] in [place] - has quantity of 53 pills.  oxyCODONE-acetaminophen (PERCOCET) 10-325 MG tablet

## 2022-02-18 ENCOUNTER — Ambulatory Visit: Payer: Self-pay | Admitting: Nurse Practitioner

## 2022-02-22 ENCOUNTER — Telehealth: Payer: Self-pay | Admitting: Orthopaedic Surgery

## 2022-02-22 NOTE — Telephone Encounter (Signed)
Patient called, relays he is still having problems obtaining his pain medication from [facility]. Said he did get some of the quantity at the other pharmacy however states it is too expensive to continue to use any other pharmacy. He is asking if he 'can go back to a lesser strength he had before, to avoid this situation?'   Best to have patient come in before anything is changed? Please advise.

## 2022-02-23 ENCOUNTER — Ambulatory Visit: Payer: Self-pay | Admitting: Nurse Practitioner

## 2022-02-23 MED ORDER — OXYCODONE-ACETAMINOPHEN 7.5-325 MG PO TABS: 1.0000 | ORAL_TABLET | Freq: Four times a day (QID) | ORAL | 0 refills | Status: DC | PRN

## 2022-02-23 NOTE — Telephone Encounter (Signed)
Patient aware.

## 2022-02-24 ENCOUNTER — Telehealth: Payer: Self-pay

## 2022-02-24 NOTE — Telephone Encounter (Signed)
Called client for follow up. He needs to renew [facility], [facility] and [facility] FA. He also needs to resubmit for [facility] assistance as he is seeing [facility]. Made client an appointment for tomorrow 02/25/22 at 11 am to renew above and begin process for [facility] financial renewal. Discussed documents needed.  [name] RN Clara Gunn/[facility]

## 2022-03-23 NOTE — Congregational Nurse Program (Signed)
  Dept: [phone]   Congregational Nurse Program Note  Date of Encounter: 03/23/2022  Past Medical History: Past Medical History:  Diagnosis Date   Arthritis    Diabetes mellitus without complication ([place])    diagnosed about age 56   Gout    Hypertension    boarderline    Encounter Details:  CNP Questionnaire - 03/23/22 1139       Questionnaire   Do you give verbal consent to treat you today? Yes    Location Patient [place] or Organization    Patient Status Unknown    Insurance Uninsured ([place] Card/[facility]/Self-Pay)    Insurance Referral [place] [employer]/[facility]    Medication N/A    Medical Provider Yes    Screening Referrals N/A    Medical Referral N/A    Medical Appointment Made N/A    Food N/A    Transportation N/A    Housing/Utilities N/A    Interpersonal Safety N/A    Intervention Case Management    ED Visit Averted N/A    Life-Saving Intervention Made N/A

## 2022-03-23 NOTE — Congregational Nurse Program (Signed)
Client didn't recall having any upcoming appointments with PCP. Client didn't express any food or utilities/ housing insecurities and didn't have any safety or behavior needs or needed any transportation assistance to medical appointments.

## 2022-03-24 ENCOUNTER — Ambulatory Visit: Payer: Self-pay | Admitting: Nurse Practitioner

## 2022-03-25 ENCOUNTER — Ambulatory Visit: Payer: Self-pay | Admitting: Nurse Practitioner

## 2022-03-25 ENCOUNTER — Telehealth: Payer: Self-pay | Admitting: Orthopaedic Surgery

## 2022-03-25 MED ORDER — OXYCODONE-ACETAMINOPHEN 10-325 MG PO TABS: 1.0000 | ORAL_TABLET | ORAL | 0 refills | Status: DC | PRN

## 2022-03-25 NOTE — Telephone Encounter (Addendum)
Patient requests refill: oxyCODONE-acetaminophen (PERCOCET)* *patient is asking if he may get the 10-325, due to having to get the 7.5-325 when there was a back order   [employer] on [address], [place]

## 2022-04-12 ENCOUNTER — Encounter: Payer: Self-pay | Admitting: Nurse Practitioner

## 2022-04-12 ENCOUNTER — Ambulatory Visit: Payer: Self-pay

## 2022-04-12 VITALS — BP 139/87 | HR 96 | Ht 68.0 in | Wt 342.0 lb

## 2022-04-12 LAB — POCT GLYCOSYLATED HEMOGLOBIN (HGB A1C): HbA1c POC (<> result, manual entry): >15 % (ref 4.0–5.6)

## 2022-04-13 ENCOUNTER — Encounter: Payer: Self-pay | Admitting: Orthopaedic Surgery

## 2022-04-13 ENCOUNTER — Ambulatory Visit (INDEPENDENT_AMBULATORY_CARE_PROVIDER_SITE_OTHER): Payer: Self-pay | Admitting: Orthopaedic Surgery

## 2022-04-13 VITALS — BP 160/104 | HR 92 | Ht 67.0 in | Wt 343.0 lb

## 2022-04-13 DIAGNOSIS — M25561 Pain in right knee: Secondary | ICD-10-CM

## 2022-04-13 DIAGNOSIS — Z6841 Body Mass Index (BMI) 40.0 and over, adult: Secondary | ICD-10-CM

## 2022-04-13 DIAGNOSIS — G8929 Other chronic pain: Secondary | ICD-10-CM

## 2022-04-13 DIAGNOSIS — M25562 Pain in left knee: Secondary | ICD-10-CM

## 2022-04-13 NOTE — Progress Notes (Signed)
My knees hurt.  He has pain of both knees with swelling and popping but no giving way.  He has no trauma.  He has no distal edema.  He is taking his medicine.  Both knees are tender, have effusion, crepitus.; ROM left 0 to 100, right 0 to 105, limp left, NV intact.  Encounter Diagnoses  Name Primary?   Chronic pain of right knee Yes   Chronic pain of left knee    Morbid obesity due to excess calories (HCC)    Body mass index 60.0-69.9, adult ([place])    Return in three months.  Electronically Signed Sanjuana Kava, MD 8/8/20232:34 PM

## 2022-04-13 NOTE — Progress Notes (Signed)
Erroneous encounter

## 2022-04-22 ENCOUNTER — Telehealth: Payer: Self-pay | Admitting: Orthopaedic Surgery

## 2022-04-22 MED ORDER — OXYCODONE-ACETAMINOPHEN 10-325 MG PO TABS: 1.0000 | ORAL_TABLET | ORAL | 0 refills | Status: DC | PRN

## 2022-04-22 NOTE — Telephone Encounter (Signed)
Patient requests refill: oxyCODONE-acetaminophen (PERCOCET) 10-325 MG tablet 170 tablet      [facility] [address]

## 2022-04-23 ENCOUNTER — Telehealth: Payer: Self-pay

## 2022-04-23 NOTE — Telephone Encounter (Signed)
Attempted call to Follow up with [facility] DM client. No answer, left VM requesting return call.   Noted that client has an [place] appointment with Endocrine on 04/28/22 also follow [facility] 05[facility]22. Left message to encourage client to discuss with Endocrine a referral to Dietician as he has stated he enjoyed [place].  Have sent to client's email some DM education videos on Diabetes care and nutrition via [place].   Will continue to follow for support and resources.  [employer]

## 2022-04-26 ENCOUNTER — Other Ambulatory Visit: Payer: Self-pay | Admitting: Physician Assistant

## 2022-04-26 DIAGNOSIS — E1165 Type 2 diabetes mellitus with hyperglycemia: Secondary | ICD-10-CM

## 2022-04-26 DIAGNOSIS — E785 Hyperlipidemia, unspecified: Secondary | ICD-10-CM

## 2022-04-26 DIAGNOSIS — I10 Essential (primary) hypertension: Secondary | ICD-10-CM

## 2022-04-28 ENCOUNTER — Ambulatory Visit (INDEPENDENT_AMBULATORY_CARE_PROVIDER_SITE_OTHER): Payer: Self-pay | Admitting: Nurse Practitioner

## 2022-04-28 ENCOUNTER — Encounter: Payer: Self-pay | Admitting: Nurse Practitioner

## 2022-04-28 VITALS — BP 146/90 | HR 91 | Ht 68.0 in | Wt 347.6 lb

## 2022-04-28 DIAGNOSIS — E782 Mixed hyperlipidemia: Secondary | ICD-10-CM

## 2022-04-28 DIAGNOSIS — I10 Essential (primary) hypertension: Secondary | ICD-10-CM

## 2022-04-28 DIAGNOSIS — E1165 Type 2 diabetes mellitus with hyperglycemia: Secondary | ICD-10-CM

## 2022-04-28 DIAGNOSIS — Z91199 Patient's noncompliance with other medical treatment and regimen due to unspecified reason: Secondary | ICD-10-CM

## 2022-04-28 MED ORDER — NOVOLOG FLEXPEN 100 UNIT/ML ~~LOC~~ SOPN: 16.0000 [IU] | PEN_INJECTOR | Freq: Three times a day (TID) | SUBCUTANEOUS | 3 refills | Status: DC

## 2022-04-28 MED ORDER — TRULICITY 1.5 MG/0.5ML ~~LOC~~ SOAJ: 1.5000 mg | SUBCUTANEOUS | 3 refills | Status: DC

## 2022-04-28 NOTE — Progress Notes (Signed)
04/28/2022, 3:37 PM      Endocrinology Follow Up Visit  Subjective:    Patient ID: [name], male    DOB: 10-16-1965.  [name] is being seen in follow up for management of currently uncontrolled symptomatic diabetes requested by  [name], PA-C.   Past Medical History:  Diagnosis Date   Arthritis    Diabetes mellitus without complication ([place])    diagnosed about age 56   Gout    Hypertension    boarderline    Past Surgical History:  Procedure Laterality Date   HERNIA REPAIR  2008   MASS EXCISION Left age 56   L thigh- benign    Social History   Socioeconomic History   Marital status: Single    Spouse name: Not on file   Number of children: Not on file   Years of education: Not on file   Highest education level: Not on file  Occupational History   Not on file  Tobacco Use   Smoking status: Former    Packs/day: 0.50    Years: 5.00    Total pack years: 2.50    Types: Cigarettes    Quit date: 47    Years since quitting: 33.6   Smokeless tobacco: Never  Vaping Use   Vaping Use: Never used  Substance and Sexual Activity   Alcohol use: No   Drug use: No   Sexual activity: Not on file  Other Topics Concern   Not on file  Social History Narrative   Not on file   Social Determinants of Health   Financial Resource Strain: Not on file  Food Insecurity: Not on file  Transportation Needs: Not on file  Physical Activity: Not on file  Stress: Not on file  Social Connections: Not on file    Family History  Problem Relation Age of Onset   Cancer Mother    Diabetes Mother    Heart failure Father    Hypertension Father    Diabetes Father    Heart attack Father     Outpatient Encounter Medications as of 04/28/2022  Medication Sig   allopurinol (ZYLOPRIM) 300 MG tablet Take 1 tablet (300 mg total) by mouth daily. (Patient taking differently: Take 300 mg by mouth daily as  needed.)   amLODipine (NORVASC) 5 MG tablet Take 1 tablet (5 mg total) by mouth daily.   atorvastatin (LIPITOR) 80 MG tablet Take 1 tablet (80 mg total) by mouth daily.   cyclobenzaprine (FLEXERIL) 10 MG tablet Take 1 tablet (10 mg total) by mouth 3 (three) times daily as needed for muscle spasms.   Dulaglutide (TRULICITY) 1.5 QI/3.4VQ SOPN Inject 1.5 mg into the skin once a week.   furosemide (LASIX) 20 MG tablet Take 1 tablet (20 mg total) by mouth daily.   glipiZIDE (GLUCOTROL XL) 5 MG 24 hr tablet Take 1 tablet (5 mg total) by mouth daily with breakfast.   insulin aspart (NOVOLOG FLEXPEN) 100 UNIT/ML FlexPen Inject 16-22 Units into the skin 3 (three) times daily with meals.   lisinopril (ZESTRIL) 20 MG tablet Take 1 tablet (20 mg total) by mouth daily.   metFORMIN (GLUCOPHAGE XR) 500 MG 24 hr tablet Take  2 tablets (1,000 mg total) by mouth daily with breakfast. (Patient taking differently: Take 1,000 mg by mouth in the morning and at bedtime.)   metoprolol tartrate (LOPRESSOR) 100 MG tablet Take 1 tablet (100 mg total) by mouth 2 (two) times daily.   nystatin cream (MYCOSTATIN) Apply 1 application. topically 2 (two) times daily. To penile irritation.   oxyCODONE-acetaminophen (PERCOCET) 10-325 MG tablet Take 1 tablet by mouth every 4 (four) hours as needed for pain.   No facility-administered encounter medications on file as of 04/28/2022.    ALLERGIES: No Known Allergies  VACCINATION STATUS: Immunization History  Administered Date(s) Administered   Influenza,inj,Quad PF,6+ Mos 06/30/2017   Moderna Sars-Covid-2 Vaccination 04/01/2020, 04/29/2020    Diabetes He presents for his follow-up diabetic visit. He has type 2 diabetes mellitus. Onset time: He was diagnosed at approximate age of 56 years. His disease course has been worsening. There are no hypoglycemic associated symptoms. Pertinent negatives for hypoglycemia include no confusion, headaches, pallor or seizures. Associated  symptoms include blurred vision, fatigue, polydipsia, polyphagia, polyuria and weight loss. Pertinent negatives for diabetes include no chest pain and no weakness. There are no hypoglycemic complications. Symptoms are stable. There are no diabetic complications. Risk factors for coronary artery disease include dyslipidemia, diabetes mellitus, hypertension, male sex, obesity and sedentary lifestyle. Current diabetic treatment includes intensive insulin program and oral agent (dual therapy). He is compliant with treatment some of the time (Has not been taking the Apidra due to leg cramps on this medication). His weight is decreasing steadily. He is following a generally unhealthy diet. When asked about meal planning, he reported none. He has not had a previous visit with a dietitian. He never participates in exercise. His home blood glucose trend is increasing steadily. His overall blood glucose range is >200 mg/dl. (He presents today (after long absence) with his meter, no logs, showing gross hyperglycemia overall.  His most recent A1c on 8/7 was >15%.  He admits he has not been taking the Apidra as it caused him severe leg cramps.  He stopped that medication and his leg cramps went away.  He has been out of his Trulicity and did not call to have it refilled.  He notes he "kind of gave up" as far as diabetes management goes.  He still struggles with the ability to avoid temptation.  Analysis of his meter shows 7-day average of 396, 14-day average of 418, 30-day average of 438.) An ACE inhibitor/angiotensin II receptor blocker is being taken. He does not see a podiatrist.Eye exam is current.  Hyperlipidemia This is a chronic problem. The current episode started more than 1 year ago. The problem is uncontrolled. Recent lipid tests were reviewed and are high. Exacerbating diseases include diabetes and obesity. Factors aggravating his hyperlipidemia include beta blockers and fatty foods. Pertinent negatives include no  chest pain, myalgias or shortness of breath. Current antihyperlipidemic treatment includes statins. The current treatment provides moderate improvement of lipids. Compliance problems include adherence to diet, adherence to exercise and medication cost.  Risk factors for coronary artery disease include dyslipidemia, diabetes mellitus, family history, hypertension, male sex, obesity and a sedentary lifestyle.  Hypertension This is a chronic problem. The current episode started more than 1 year ago. The problem has been resolved since onset. The problem is controlled. Associated symptoms include blurred vision. Pertinent negatives include no chest pain, headaches, malaise/fatigue, neck pain, palpitations or shortness of breath. There are no associated agents to hypertension. Risk factors for coronary artery disease  include dyslipidemia, diabetes mellitus, male gender, obesity and sedentary lifestyle. Past treatments include ACE inhibitors, beta blockers, calcium channel blockers and diuretics. The current treatment provides mild improvement. Compliance problems include medication cost, diet and exercise.     Review of systems  Constitutional: + decreasing body weight (per patient report),  current Body mass index is 52.85 kg/m., + fatigue, no subjective hyperthermia, no subjective hypothermia Eyes: + blurry vision, no xerophthalmia ENT: no sore throat, no nodules palpated in throat, no dysphagia/odynophagia, no hoarseness Cardiovascular: no chest pain, no shortness of breath, no palpitations, no leg swelling Respiratory: no cough, no shortness of breath Gastrointestinal: no nausea/vomiting/diarrhea Genitourinary: + polyuria Musculoskeletal: + generalized muscle/joint aches Skin: no rashes, no hyperemia Neurological: no tremors, no numbness, no tingling, no dizziness Psychiatric: no depression, no anxiety    Objective:    BP (!) 146/90 (BP Location: Left Arm, Patient Position: Sitting, Cuff Size:  Normal)   Pulse 91   Ht '5\' 8"'$  (1.727 m)   Wt (!) 347 lb 9.6 oz (157.7 kg)   BMI 52.85 kg/m   Wt Readings from Last 3 Encounters:  04/28/22 (!) 347 lb 9.6 oz (157.7 kg)  04/13/22 (!) 343 lb (155.6 kg)  04/12/22 (!) 342 lb (155.1 kg)    BP Readings from Last 3 Encounters:  04/28/22 (!) 146/90  04/13/22 (!) 160/104  04/12/22 139/87     Physical Exam- Limited  Constitutional:  Body mass index is 52.85 kg/m. , not in acute distress, normal state of mind Eyes:  EOMI, no exophthalmos Neck: Supple Cardiovascular: RRR, no murmurs, rubs, or gallops, no edema Respiratory: Adequate breathing efforts, no crackles, rales, rhonchi, or wheezing Musculoskeletal: no gross deformities, strength intact in all four extremities, no gross restriction of joint movements Skin:  no rashes, no hyperemia Neurological: no tremor with outstretched hands   CMP ( most recent) CMP     Component Value Date/Time   NA 132 (L) 02/02/2022 1451   K 4.9 02/02/2022 1451   CL 98 02/02/2022 1451   CO2 25 02/02/2022 1451   GLUCOSE 444 (H) 02/02/2022 1451   BUN 14 02/02/2022 1451   CREATININE 0.80 02/02/2022 1451   CREATININE 1.42 (H) 09/08/2017 1540   CALCIUM 9.2 02/02/2022 1451   PROT 7.6 02/02/2022 1451   ALBUMIN 3.4 (L) 02/02/2022 1451   AST 22 02/02/2022 1451   ALT 26 02/02/2022 1451   ALKPHOS 189 (H) 02/02/2022 1451   BILITOT 0.7 02/02/2022 1451   GFRNONAA >60 02/02/2022 1451   GFRAA >60 03/17/2020 1200     Diabetic Labs (most recent): Lab Results  Component Value Date   HGBA1C >15 04/12/2022   HGBA1C >15.5 (H) 09/28/2021   HGBA1C >15.5 (H) 07/07/2021   MICROALBUR <3.0 (H) 07/07/2021   MICROALBUR 80 02/26/2021   MICROALBUR 13.3 (H) 10/05/2019     Lipid Panel ( most recent) Lipid Panel     Component Value Date/Time   CHOL 289 (H) 02/02/2022 1450   TRIG 261 (H) 02/02/2022 1450   HDL 44 02/02/2022 1450   CHOLHDL 6.6 02/02/2022 1450   VLDL 52 (H) 02/02/2022 1450   LDLCALC 193 (H)  02/02/2022 1450      Lab Results  Component Value Date   TSH 0.532 06/28/2017       Assessment & Plan:   1) Uncontrolled type 2 diabetes mellitus with hyperglycemia ([facility])  - [name] has currently uncontrolled symptomatic type 2 DM since  56 years of age.  He presents today (  after long absence) with his meter, no logs, showing gross hyperglycemia overall.  His most recent A1c on 8/7 was >15%.  He admits he has not been taking the Apidra as it caused him severe leg cramps.  He stopped that medication and his leg cramps went away.  He has been out of his Trulicity and did not call to have it refilled.  He notes he "kind of gave up" as far as diabetes management goes.  He still struggles with the ability to avoid temptation.  Analysis of his meter shows 7-day average of 396, 14-day average of 418, 30-day average of 438.  -Recent labs reviewed.  - I had a long discussion with him about the progressive nature of diabetes and the pathology behind its complications. -his diabetes is complicated by obesity/sedentary life, inadequate insurance coverage,  And he remains at a high risk for more acute and chronic complications which include CAD, CVA, CKD, retinopathy, and neuropathy. These are all discussed in detail with him.  - Nutritional counseling repeated at each appointment due to patients tendency to fall back in to old habits.  - The patient admits there is a room for improvement in their diet and drink choices. -  Suggestion is made for the patient to avoid simple carbohydrates from their diet including Cakes, Sweet Desserts / Pastries, Ice Cream, Soda (diet and regular), Sweet Tea, Candies, Chips, Cookies, Sweet Pastries, Store Bought Juices, Alcohol in Excess of 1-2 drinks a day, Artificial Sweeteners, Coffee Creamer, and "Sugar-free" Products. This will help patient to have stable blood glucose profile and potentially avoid unintended weight gain.   - I encouraged the patient to  switch to unprocessed or minimally processed complex starch and increased protein intake (animal or plant source), fruits, and vegetables.   - Patient is advised to stick to a routine mealtimes to eat 3 meals a day and avoid unnecessary snacks (to snack only to correct hypoglycemia).  - he will be scheduled with Jearld Fenton, RDN, CDE for diabetes education.  - I have approached him with the following individualized plan to manage  his diabetes and patient agrees:   -He is advised to continue his Lantus to 80 units SQ nightly, will change his Apidra to Novolog but at same dose of 16-22 units TID with meals if glucose is above 90 and he is eating but to be more consistent with taking it (Specific instructions on how to titrate insulin dosage based on glucose readings given to patient in writing).  He can also continue Metformin 1000 mg ER PO daily with breakfast, Glipizide 5 mg XL daily with breakfast, and restart his Trulicity 1.5 mg SQ weekly.  He is encouraged consistently monitor blood glucose 4 times per day, before meals and at bedtime and report to the clinic if readings are less than 70 or greater than 300 for 3 tests in a row.  - Specific targets for  A1c;  LDL, HDL,  and Triglycerides were discussed with the patient.  2) Blood Pressure /Hypertension:   His blood pressure is controlled to target.  He is advised to continue Norvasc 5 mg po daily, Lasix 20 mg po daily, Lisinopril 20 mg po daily, and Metoprolol 100 mg po twice daily.    3) Lipids/Hyperlipidemia:   His recent lipid panel from 02/02/22 shows uncontrolled LDL of 193 and elevated triglycerides of 261.  He is advised to continue Atorvastatin 40 mg po daily at bedtime.  Side effects and precautions discussed with him.  4)  Weight/Diet:  His Body mass index is 52.85 kg/m.-   clearly complicating his diabetes care.   he is  a candidate for modest weight loss. I discussed with him the fact that loss of 5 - 10% of his  current  body weight will have the most impact on his diabetes management.  Exercise, and detailed carbohydrates information provided  -  detailed on discharge instructions.  He is a good candidate for bariatric surgery, however at this point he has no insurance.  5) Chronic Care/Health Maintenance: -he is on ACEI/ARB and Statin medications and is encouraged to initiate and continue to follow up with Ophthalmology, Dentist,  Podiatrist at least yearly or according to recommendations, and advised to stay away from smoking. I have recommended yearly flu vaccine and pneumonia vaccine at least every 5 years; moderate intensity exercise for up to 150 minutes weekly; and  sleep for at least 7 hours a day.  - he is advised to maintain close follow up with [name], PA-C for primary care needs, as well as his other providers for optimal and coordinated care.     I spent 42 minutes in the care of the patient today including review of labs from [place], Lipids, Thyroid Function, Hematology (current and previous including abstractions from other facilities); face-to-face time discussing  his blood glucose readings/logs, discussing hypoglycemia and hyperglycemia episodes and symptoms, medications doses, his options of short and long term treatment based on the latest standards of care / guidelines;  discussion about incorporating lifestyle medicine;  and documenting the encounter. Risk reduction counseling performed per USPSTF guidelines to reduce  obesity and cardiovascular risk factors.     Please refer to Patient Instructions for Blood Glucose Monitoring and Insulin/Medications Dosing Guide"  in media tab for additional information. Please  also refer to " Patient Self Inventory" in the Media  tab for reviewed elements of pertinent patient history.  [name] participated in the discussions, expressed understanding, and voiced agreement with the above plans.  All questions were answered to his satisfaction. he is  encouraged to contact clinic should he have any questions or concerns prior to his return visit.   Follow up plan: - Return in about 1 month (around 05/29/2022) for Diabetes F/U, Bring meter and logs.  Rayetta Pigg, [facility] [address] [place], [place] [address] Phone: [phone] Fax: [phone]  04/28/2022, 3:37 PM

## 2022-05-04 ENCOUNTER — Other Ambulatory Visit (HOSPITAL_COMMUNITY)
Admission: RE | Admit: 2022-05-04 | Discharge: 2022-05-04 | Disposition: A | Discharge status: Home / Self Care | Payer: Self-pay | Source: Ambulatory Visit | Attending: Physician Assistant | Admitting: Physician Assistant

## 2022-05-04 DIAGNOSIS — E1165 Type 2 diabetes mellitus with hyperglycemia: Secondary | ICD-10-CM | POA: Insufficient documentation

## 2022-05-04 DIAGNOSIS — E785 Hyperlipidemia, unspecified: Secondary | ICD-10-CM | POA: Insufficient documentation

## 2022-05-04 DIAGNOSIS — I10 Essential (primary) hypertension: Secondary | ICD-10-CM | POA: Insufficient documentation

## 2022-05-04 LAB — COMPREHENSIVE METABOLIC PANEL
ALT: 27 U/L (ref 0–44)
AST: 20 U/L (ref 15–41)
Albumin: 3.3 g/dL — ABNORMAL LOW (ref 3.5–5.0)
Alkaline Phosphatase: 208 U/L — ABNORMAL HIGH (ref 38–126)
Anion gap: 7 (ref 5–15)
BUN: 11 mg/dL (ref 6–20)
CO2: 26 mmol/L (ref 22–32)
Calcium: 9.1 mg/dL (ref 8.9–10.3)
Chloride: 103 mmol/L (ref 98–111)
Creatinine, Ser: 0.73 mg/dL (ref 0.61–1.24)
GFR, Estimated: >60 mL/min (ref >60)
Glucose, Bld: 342 mg/dL — ABNORMAL HIGH (ref 70–99)
Potassium: 4.2 mmol/L (ref 3.5–5.1)
Sodium: 136 mmol/L (ref 135–145)
Total Bilirubin: 0.8 mg/dL (ref 0.3–1.2)
Total Protein: 7.4 g/dL (ref 6.5–8.1)

## 2022-05-04 LAB — LIPID PANEL
Cholesterol: 255 mg/dL — ABNORMAL HIGH (ref 0–200)
HDL: 47 mg/dL (ref >40)
LDL Cholesterol: 178 mg/dL — ABNORMAL HIGH (ref 0–99)
Total CHOL/HDL Ratio: 5.4 RATIO
Triglycerides: 150 mg/dL — ABNORMAL HIGH (ref <150)
VLDL: 30 mg/dL (ref 0–40)

## 2022-05-05 ENCOUNTER — Encounter: Payer: Self-pay | Admitting: Physician Assistant

## 2022-05-05 ENCOUNTER — Ambulatory Visit: Payer: Self-pay | Admitting: Physician Assistant

## 2022-05-05 VITALS — BP 140/90 | HR 90 | Wt 345.0 lb

## 2022-05-05 DIAGNOSIS — F509 Eating disorder, unspecified: Secondary | ICD-10-CM

## 2022-05-05 DIAGNOSIS — G8929 Other chronic pain: Secondary | ICD-10-CM

## 2022-05-05 DIAGNOSIS — L602 Onychogryphosis: Secondary | ICD-10-CM

## 2022-05-05 DIAGNOSIS — E1165 Type 2 diabetes mellitus with hyperglycemia: Secondary | ICD-10-CM

## 2022-05-05 DIAGNOSIS — E785 Hyperlipidemia, unspecified: Secondary | ICD-10-CM

## 2022-05-05 DIAGNOSIS — L84 Corns and callosities: Secondary | ICD-10-CM

## 2022-05-05 DIAGNOSIS — E291 Testicular hypofunction: Secondary | ICD-10-CM

## 2022-05-05 DIAGNOSIS — R7989 Other specified abnormal findings of blood chemistry: Secondary | ICD-10-CM

## 2022-05-05 DIAGNOSIS — F419 Anxiety disorder, unspecified: Secondary | ICD-10-CM

## 2022-05-05 DIAGNOSIS — I10 Essential (primary) hypertension: Secondary | ICD-10-CM

## 2022-05-05 MED ORDER — METOPROLOL TARTRATE 100 MG PO TABS: 100.0000 mg | ORAL_TABLET | Freq: Two times a day (BID) | ORAL | 0 refills | Status: DC

## 2022-05-05 MED ORDER — LISINOPRIL 20 MG PO TABS
20.0000 mg | ORAL_TABLET | Freq: Every day | ORAL | 0 refills | Status: DC
Start: 2022-05-05 — End: 2022-07-20

## 2022-05-05 MED ORDER — METFORMIN HCL ER 500 MG PO TB24: 1000.0000 mg | ORAL_TABLET | Freq: Every day | ORAL | 0 refills | Status: DC

## 2022-05-05 MED ORDER — ATORVASTATIN CALCIUM 80 MG PO TABS: 80.0000 mg | ORAL_TABLET | Freq: Every day | ORAL | 0 refills | Status: DC

## 2022-05-05 MED ORDER — AMLODIPINE BESYLATE 10 MG PO TABS: 10.0000 mg | ORAL_TABLET | Freq: Every day | ORAL | 0 refills | Status: DC

## 2022-05-05 NOTE — Patient Instructions (Addendum)
[facility] -  Tues. 06-01-22 at 2:20PM  ---------------------------  Notify us when you get cone assistance so we can refer you to podiatrist  --------------------------------  Contact us for appointment with counselor

## 2022-05-05 NOTE — Progress Notes (Signed)
BP (!) 140/90   Pulse 90   Wt (!) 345 lb (156.5 kg)   SpO2 99%   BMI 52.46 kg/m    Subjective:    Patient ID: [name], male    DOB: 01/11/1966, 56 y.o.   MRN: [id number]  HPI: [name] is a 56 y.o. male presenting on 05/05/2022 for Hypertension and Hyperlipidemia   HPI   Chief Complaint  Patient presents with   Hypertension   Hyperlipidemia     He says he has been doing pretty good.  He is Still stressed.  He is sleeping,  More during the day than at night.  He didn't bring his meds so can't really say what he's taking.  He Went to endocrinology last week-   a1c > 15.    He has cut down a lot on his nocturnal eating.   Endocrine changed his apidra to novolog; he is to continue lantus, metformin and glipizide.    Pt says he Misses cholesterol medication doses 2 or 3 day/week    How is his mood- pretty good-  his DM numbers are good this week and that makes him happy.       Relevant past medical, surgical, family and social history reviewed and updated as indicated. Interim medical history since our last visit reviewed. Allergies and medications reviewed and updated.   Current Outpatient Medications:    allopurinol (ZYLOPRIM) 300 MG tablet, Take 1 tablet (300 mg total) by mouth daily. (Patient taking differently: Take 300 mg by mouth daily as needed.), Disp: 30 tablet, Rfl: 5   amLODipine (NORVASC) 5 MG tablet, Take 1 tablet (5 mg total) by mouth daily., Disp: 90 tablet, Rfl: 0   atorvastatin (LIPITOR) 80 MG tablet, Take 1 tablet (80 mg total) by mouth daily., Disp: 90 tablet, Rfl: 0   cyclobenzaprine (FLEXERIL) 10 MG tablet, Take 1 tablet (10 mg total) by mouth 3 (three) times daily as needed for muscle spasms., Disp: 30 tablet, Rfl: 0   Dulaglutide (TRULICITY) 1.5 OJ/5.0KX SOPN, Inject 1.5 mg into the skin once a week., Disp: 6 mL, Rfl: 3   furosemide (LASIX) 20 MG tablet, Take 1 tablet (20 mg total) by mouth daily., Disp: 30 tablet, Rfl: 1   glipiZIDE  (GLUCOTROL XL) 5 MG 24 hr tablet, Take 1 tablet (5 mg total) by mouth daily with breakfast., Disp: 90 tablet, Rfl: 3   insulin aspart (NOVOLOG FLEXPEN) 100 UNIT/ML FlexPen, Inject 16-22 Units into the skin 3 (three) times daily with meals., Disp: 45 mL, Rfl: 3   lisinopril (ZESTRIL) 20 MG tablet, Take 1 tablet (20 mg total) by mouth daily., Disp: 90 tablet, Rfl: 0   metFORMIN (GLUCOPHAGE XR) 500 MG 24 hr tablet, Take 2 tablets (1,000 mg total) by mouth daily with breakfast. (Patient taking differently: Take 1,000 mg by mouth in the morning and at bedtime.), Disp: 90 tablet, Rfl: 1   metoprolol tartrate (LOPRESSOR) 100 MG tablet, Take 1 tablet (100 mg total) by mouth 2 (two) times daily., Disp: 180 tablet, Rfl: 0   oxyCODONE-acetaminophen (PERCOCET) 10-325 MG tablet, Take 1 tablet by mouth every 4 (four) hours as needed for pain., Disp: 170 tablet, Rfl: 0   nystatin cream (MYCOSTATIN), Apply 1 application. topically 2 (two) times daily. To penile irritation. (Patient not taking: Reported on 05/05/2022), Disp: 30 g, Rfl: 3    Review of Systems  Per HPI unless specifically indicated above     Objective:    BP [name])  140/90   Pulse 90   Wt (!) 345 lb (156.5 kg)   SpO2 99%   BMI 52.46 kg/m   Wt Readings from Last 3 Encounters:  05/05/22 (!) 345 lb (156.5 kg)  04/28/22 (!) 347 lb 9.6 oz (157.7 kg)  04/13/22 (!) 343 lb (155.6 kg)    Physical Exam Vitals reviewed.  Constitutional:      General: He is not in acute distress.    Appearance: He is well-developed. He is not ill-appearing.  HENT:     Head: Normocephalic and atraumatic.  Cardiovascular:     Rate and Rhythm: Normal rate and regular rhythm.  Pulmonary:     Effort: Pulmonary effort is normal.     Breath sounds: Normal breath sounds. No wheezing.  Abdominal:     General: Bowel sounds are normal.     Palpations: Abdomen is soft.     Tenderness: There is no abdominal tenderness.  Musculoskeletal:     Cervical back: Neck supple.      Comments: Some scabs LE.  No acute infection or open wounds seen  Lymphadenopathy:     Cervical: No cervical adenopathy.  Skin:    General: Skin is warm and dry.  Neurological:     Mental Status: He is alert and oriented to person, place, and time.  Psychiatric:        Behavior: Behavior normal.     Results for orders placed or performed during the hospital encounter of 05/04/22  Lipid panel  Result Value Ref Range   Cholesterol 255 (H) 0 - 200 mg/dL   Triglycerides 150 (H) <150 mg/dL   HDL 47 >40 mg/dL   Total CHOL/HDL Ratio 5.4 RATIO   VLDL 30 0 - 40 mg/dL   LDL Cholesterol 178 (H) 0 - 99 mg/dL  Comprehensive metabolic panel  Result Value Ref Range   Sodium 136 135 - 145 mmol/L   Potassium 4.2 3.5 - 5.1 mmol/L   Chloride 103 98 - 111 mmol/L   CO2 26 22 - 32 mmol/L   Glucose, Bld 342 (H) 70 - 99 mg/dL   BUN 11 6 - 20 mg/dL   Creatinine, Ser 0.73 0.61 - 1.24 mg/dL   Calcium 9.1 8.9 - 10.3 mg/dL   Total Protein 7.4 6.5 - 8.1 g/dL   Albumin 3.3 (L) 3.5 - 5.0 g/dL   AST 20 15 - 41 U/L   ALT 27 0 - 44 U/L   Alkaline Phosphatase 208 (H) 38 - 126 U/L   Total Bilirubin 0.8 0.3 - 1.2 mg/dL   GFR, Estimated >60 >60 mL/min   Anion gap 7 5 - 15               Assessment & Plan:   Encounter Diagnoses  Name Primary?   Uncontrolled type 2 diabetes mellitus with hyperglycemia ([place]) Yes   Essential hypertension    Hyperlipidemia, unspecified hyperlipidemia type    Morbid obesity ([place])    Anxiety    Eating disorder, unspecified type    Elevated LFTs    Corn of foot    Overgrown toenails    Chronic pain of both knees    Hypogonadism in male      DM -reviewed labs with pt -discused with pt that pt assistance for novolog is harder to get than apidra.  Will try to get novolog- pt given application for assistance  that he is to complete and return to office with the required documents -pt to continue with endocrinology -  He DM eye dr appt in  september  Anxiety -discussed with pt that this office has a new counselor.  He will think about seeing [facility].  He will call office for appointment if he decides  hypogonadism He is conintuing with urologist per their recommendation  HEN Change norvasc to '10mg'$   Corn Toenail overgrowth -pt can use OTC corn product for corn.  He is encouraged to avoid cutting on his foot -he is to notify office when he gets cafa and his referral to podiatry will be entered

## 2022-05-20 ENCOUNTER — Telehealth: Payer: Self-pay | Admitting: Orthopaedic Surgery

## 2022-05-20 MED ORDER — OXYCODONE-ACETAMINOPHEN 10-325 MG PO TABS: 1.0000 | ORAL_TABLET | ORAL | 0 refills | Status: DC | PRN

## 2022-05-20 NOTE — Telephone Encounter (Signed)
Patient called to request refill: oxyCODONE-acetaminophen (PERCOCET) 10-325 MG tablet 170 tablet      [facility], Linna Hoff

## 2022-05-31 ENCOUNTER — Ambulatory Visit (INDEPENDENT_AMBULATORY_CARE_PROVIDER_SITE_OTHER): Payer: Self-pay | Admitting: Nurse Practitioner

## 2022-05-31 ENCOUNTER — Encounter: Payer: Self-pay | Admitting: Nurse Practitioner

## 2022-05-31 VITALS — BP 150/83 | HR 103 | Ht 68.0 in | Wt 352.6 lb

## 2022-05-31 DIAGNOSIS — I10 Essential (primary) hypertension: Secondary | ICD-10-CM

## 2022-05-31 DIAGNOSIS — E782 Mixed hyperlipidemia: Secondary | ICD-10-CM

## 2022-05-31 DIAGNOSIS — Z91199 Patient's noncompliance with other medical treatment and regimen due to unspecified reason: Secondary | ICD-10-CM

## 2022-05-31 DIAGNOSIS — E1165 Type 2 diabetes mellitus with hyperglycemia: Secondary | ICD-10-CM

## 2022-05-31 MED ORDER — GLIPIZIDE ER 5 MG PO TB24: 5.0000 mg | ORAL_TABLET | Freq: Every day | ORAL | 3 refills | Status: DC

## 2022-05-31 MED ORDER — TRULICITY 1.5 MG/0.5ML ~~LOC~~ SOAJ: 1.5000 mg | SUBCUTANEOUS | 3 refills | Status: DC

## 2022-05-31 MED ORDER — INSULIN GLARGINE 100 UNIT/ML ~~LOC~~ SOLN
80.0000 [IU] | Freq: Every day | SUBCUTANEOUS | 3 refills | Status: DC
Start: 2022-05-31 — End: 2022-10-14

## 2022-05-31 MED ORDER — NOVOLOG FLEXPEN 100 UNIT/ML ~~LOC~~ SOPN: 16.0000 [IU] | PEN_INJECTOR | Freq: Three times a day (TID) | SUBCUTANEOUS | 3 refills | Status: DC

## 2022-05-31 NOTE — Progress Notes (Signed)
05/31/2022, 4:55 PM      Endocrinology Follow Up Visit  Subjective:    Patient ID: [name], male    DOB: 1966/06/17.  [name] is being seen in follow up for management of currently uncontrolled symptomatic diabetes requested by  [name], PA-C.   Past Medical History:  Diagnosis Date   Arthritis    Diabetes mellitus without complication ([place])    diagnosed about age 56   Gout    Hypertension    boarderline    Past Surgical History:  Procedure Laterality Date   HERNIA REPAIR  2008   MASS EXCISION Left age 43   L thigh- benign    Social History   Socioeconomic History   Marital status: Single    Spouse name: Not on file   Number of children: Not on file   Years of education: Not on file   Highest education level: Not on file  Occupational History   Not on file  Tobacco Use   Smoking status: Former    Packs/day: 0.50    Years: 5.00    Total pack years: 2.50    Types: Cigarettes    Quit date: 34    Years since quitting: 33.7   Smokeless tobacco: Never  Vaping Use   Vaping Use: Never used  Substance and Sexual Activity   Alcohol use: No   Drug use: No   Sexual activity: Not on file  Other Topics Concern   Not on file  Social History Narrative   Not on file   Social Determinants of Health   Financial Resource Strain: Not on file  Food Insecurity: Not on file  Transportation Needs: Not on file  Physical Activity: Not on file  Stress: Not on file  Social Connections: Not on file    Family History  Problem Relation Age of Onset   Cancer Mother    Diabetes Mother    Heart failure Father    Hypertension Father    Diabetes Father    Heart attack Father     Outpatient Encounter Medications as of 05/31/2022  Medication Sig   allopurinol (ZYLOPRIM) 300 MG tablet Take 1 tablet (300 mg total) by mouth daily. (Patient taking differently: Take 300 mg by mouth daily as  needed.)   amLODipine (NORVASC) 10 MG tablet Take 1 tablet (10 mg total) by mouth daily.   atorvastatin (LIPITOR) 80 MG tablet Take 1 tablet (80 mg total) by mouth daily.   cyclobenzaprine (FLEXERIL) 10 MG tablet Take 1 tablet (10 mg total) by mouth 3 (three) times daily as needed for muscle spasms.   furosemide (LASIX) 20 MG tablet Take 1 tablet (20 mg total) by mouth daily.   insulin glargine (LANTUS) 100 UNIT/ML injection Inject 0.8 mLs (80 Units total) into the skin daily.   lisinopril (ZESTRIL) 20 MG tablet Take 1 tablet (20 mg total) by mouth daily.   metFORMIN (GLUCOPHAGE XR) 500 MG 24 hr tablet Take 2 tablets (1,000 mg total) by mouth daily with breakfast.   metoprolol tartrate (LOPRESSOR) 100 MG tablet Take 1 tablet (100 mg total) by mouth 2 (two) times daily.   nystatin cream (MYCOSTATIN) Apply 1 application. topically 2 (  two) times daily. To penile irritation.   oxyCODONE-acetaminophen (PERCOCET) 10-325 MG tablet Take 1 tablet by mouth every 4 (four) hours as needed for pain.   [DISCONTINUED] glipiZIDE (GLUCOTROL XL) 5 MG 24 hr tablet Take 1 tablet (5 mg total) by mouth daily with breakfast.   [DISCONTINUED] insulin aspart (NOVOLOG FLEXPEN) 100 UNIT/ML FlexPen Inject 16-22 Units into the skin 3 (three) times daily with meals.   Dulaglutide (TRULICITY) 1.5 VV/6.1YW SOPN Inject 1.5 mg into the skin once a week.   glipiZIDE (GLUCOTROL XL) 5 MG 24 hr tablet Take 1 tablet (5 mg total) by mouth daily with breakfast.   insulin aspart (NOVOLOG FLEXPEN) 100 UNIT/ML FlexPen Inject 16-22 Units into the skin 3 (three) times daily with meals.   [DISCONTINUED] amLODipine (NORVASC) 5 MG tablet Take 1 tablet (5 mg total) by mouth daily.   [DISCONTINUED] atorvastatin (LIPITOR) 80 MG tablet Take 1 tablet (80 mg total) by mouth daily.   [DISCONTINUED] Dulaglutide (TRULICITY) 1.5 VP/7.1GG SOPN Inject 1.5 mg into the skin once a week. (Patient not taking: Reported on 05/31/2022)   [DISCONTINUED] lisinopril  (ZESTRIL) 20 MG tablet Take 1 tablet (20 mg total) by mouth daily.   [DISCONTINUED] metFORMIN (GLUCOPHAGE XR) 500 MG 24 hr tablet Take 2 tablets (1,000 mg total) by mouth daily with breakfast. (Patient taking differently: Take 1,000 mg by mouth in the morning and at bedtime.)   [DISCONTINUED] metoprolol tartrate (LOPRESSOR) 100 MG tablet Take 1 tablet (100 mg total) by mouth 2 (two) times daily.   [DISCONTINUED] oxyCODONE-acetaminophen (PERCOCET) 10-325 MG tablet Take 1 tablet by mouth every 4 (four) hours as needed for pain.   No facility-administered encounter medications on file as of 05/31/2022.    ALLERGIES: No Known Allergies  VACCINATION STATUS: Immunization History  Administered Date(s) Administered   Influenza,inj,Quad PF,6+ Mos 06/30/2017   Moderna Sars-Covid-2 Vaccination 04/01/2020, 04/29/2020    Diabetes He presents for his follow-up diabetic visit. He has type 2 diabetes mellitus. Onset time: He was diagnosed at approximate age of 53 years. His disease course has been fluctuating. There are no hypoglycemic associated symptoms. Pertinent negatives for hypoglycemia include no confusion, headaches, pallor or seizures. Associated symptoms include blurred vision, fatigue, polydipsia, polyphagia and polyuria. Pertinent negatives for diabetes include no chest pain, no weakness and no weight loss. There are no hypoglycemic complications. Symptoms are improving. There are no diabetic complications. Risk factors for coronary artery disease include dyslipidemia, diabetes mellitus, hypertension, male sex, obesity and sedentary lifestyle. Current diabetic treatment includes intensive insulin program and oral agent (dual therapy). He is compliant with treatment most of the time. His weight is fluctuating minimally. He is following a generally unhealthy diet. When asked about meal planning, he reported none. He has not had a previous visit with a dietitian. He never participates in exercise. His  home blood glucose trend is increasing steadily. His overall blood glucose range is >200 mg/dl. (He presents today with his meter, no logs, showing worsening glycemic profile in recent weeks.  Analysis of his meter shows 7-day average of 502, 14-day average of 447; 30-day average of 269.  He has glucose fluctuations from 88-596 in his meter.  He does report he has been taking his medications routinely, has run out of Trulicity and is nearly out of Novolog (currently working with [facility] to get set up with patient assistance program for this formula since he could not tolerate Apidra).) An ACE inhibitor/angiotensin II receptor blocker is being taken. He does not see a  podiatrist.Eye exam is current.  Hyperlipidemia This is a chronic problem. The current episode started more than 1 year ago. The problem is uncontrolled. Recent lipid tests were reviewed and are high. Exacerbating diseases include diabetes and obesity. Factors aggravating his hyperlipidemia include beta blockers and fatty foods. Pertinent negatives include no chest pain, myalgias or shortness of breath. Current antihyperlipidemic treatment includes statins. The current treatment provides moderate improvement of lipids. Compliance problems include adherence to diet, adherence to exercise and medication cost.  Risk factors for coronary artery disease include dyslipidemia, diabetes mellitus, family history, hypertension, male sex, obesity and a sedentary lifestyle.  Hypertension This is a chronic problem. The current episode started more than 1 year ago. The problem has been resolved since onset. The problem is controlled. Associated symptoms include blurred vision. Pertinent negatives include no chest pain, headaches, malaise/fatigue, neck pain, palpitations or shortness of breath. There are no associated agents to hypertension. Risk factors for coronary artery disease include dyslipidemia, diabetes mellitus, male gender, obesity and sedentary  lifestyle. Past treatments include ACE inhibitors, beta blockers, calcium channel blockers and diuretics. The current treatment provides mild improvement. Compliance problems include medication cost, diet and exercise.     Review of systems  Constitutional: + increasing body weight,  current Body mass index is 53.61 kg/m., + fatigue, no subjective hyperthermia, no subjective hypothermia Eyes: + blurry vision, no xerophthalmia ENT: no sore throat, no nodules palpated in throat, no dysphagia/odynophagia, no hoarseness Cardiovascular: no chest pain, no shortness of breath, no palpitations, no leg swelling Respiratory: no cough, no shortness of breath Gastrointestinal: no nausea/vomiting/diarrhea Musculoskeletal: + generalized muscle/joint aches Skin: no rashes, no hyperemia Neurological: no tremors, no numbness, no tingling, no dizziness Psychiatric: no depression, no anxiety    Objective:    BP (!) 150/83 (BP Location: Left Arm, Patient Position: Sitting, Cuff Size: Large)   Pulse (!) 103   Ht '5\' 8"'$  (1.727 m)   Wt (!) 352 lb 9.6 oz (159.9 kg)   BMI 53.61 kg/m   Wt Readings from Last 3 Encounters:  05/31/22 (!) 352 lb 9.6 oz (159.9 kg)  05/05/22 (!) 345 lb (156.5 kg)  04/28/22 (!) 347 lb 9.6 oz (157.7 kg)    BP Readings from Last 3 Encounters:  05/31/22 (!) 150/83  05/05/22 (!) 140/90  04/28/22 (!) 146/90     Physical Exam- Limited  Constitutional:  Body mass index is 53.61 kg/m. , not in acute distress, normal state of mind Eyes:  EOMI, no exophthalmos Neck: Supple Cardiovascular: RRR, no murmurs, rubs, or gallops, no edema Respiratory: Adequate breathing efforts, no crackles, rales, rhonchi, or wheezing Musculoskeletal: no gross deformities, strength intact in all four extremities, no gross restriction of joint movements Skin:  no rashes, no hyperemia Neurological: no tremor with outstretched hands   CMP ( most recent) CMP     Component Value Date/Time   NA 136  05/04/2022 0947   K 4.2 05/04/2022 0947   CL 103 05/04/2022 0947   CO2 26 05/04/2022 0947   GLUCOSE 342 (H) 05/04/2022 0947   BUN 11 05/04/2022 0947   CREATININE 0.73 05/04/2022 0947   CREATININE 1.42 (H) 09/08/2017 1540   CALCIUM 9.1 05/04/2022 0947   PROT 7.4 05/04/2022 0947   ALBUMIN 3.3 (L) 05/04/2022 0947   AST 20 05/04/2022 0947   ALT 27 05/04/2022 0947   ALKPHOS 208 (H) 05/04/2022 0947   BILITOT 0.8 05/04/2022 0947   GFRNONAA >60 05/04/2022 0947   GFRAA >60 03/17/2020 1200  Diabetic Labs (most recent): Lab Results  Component Value Date   HGBA1C >15 04/12/2022   HGBA1C >15.5 (H) 09/28/2021   HGBA1C >15.5 (H) 07/07/2021   MICROALBUR <3.0 (H) 07/07/2021   MICROALBUR 80 02/26/2021   MICROALBUR 13.3 (H) 10/05/2019     Lipid Panel ( most recent) Lipid Panel     Component Value Date/Time   CHOL 255 (H) 05/04/2022 0946   TRIG 150 (H) 05/04/2022 0946   HDL 47 05/04/2022 0946   CHOLHDL 5.4 05/04/2022 0946   VLDL 30 05/04/2022 0946   LDLCALC 178 (H) 05/04/2022 0946      Lab Results  Component Value Date   TSH 0.532 06/28/2017       Assessment & Plan:   1) Uncontrolled type 2 diabetes mellitus with hyperglycemia ([facility])  - [name] has currently uncontrolled symptomatic type 2 DM since  56 years of age.  He presents today with his meter, no logs, showing worsening glycemic profile in recent weeks.  Analysis of his meter shows 7-day average of 502, 14-day average of 447; 30-day average of 269.  He has glucose fluctuations from 88-596 in his meter.  He does report he has been taking his medications routinely, has run out of Trulicity and is nearly out of Novolog (currently working with [facility] to get set up with patient assistance program for this formula since he could not tolerate Apidra).  -Recent labs reviewed.  - I had a long discussion with him about the progressive nature of diabetes and the pathology behind its complications. -his diabetes is  complicated by obesity/sedentary life, inadequate insurance coverage,  And he remains at a high risk for more acute and chronic complications which include CAD, CVA, CKD, retinopathy, and neuropathy. These are all discussed in detail with him.  - Nutritional counseling repeated at each appointment due to patients tendency to fall back in to old habits.  - The patient admits there is a room for improvement in their diet and drink choices. -  Suggestion is made for the patient to avoid simple carbohydrates from their diet including Cakes, Sweet Desserts / Pastries, Ice Cream, Soda (diet and regular), Sweet Tea, Candies, Chips, Cookies, Sweet Pastries, Store Bought Juices, Alcohol in Excess of 1-2 drinks a day, Artificial Sweeteners, Coffee Creamer, and "Sugar-free" Products. This will help patient to have stable blood glucose profile and potentially avoid unintended weight gain.   - I encouraged the patient to switch to unprocessed or minimally processed complex starch and increased protein intake (animal or plant source), fruits, and vegetables.   - Patient is advised to stick to a routine mealtimes to eat 3 meals a day and avoid unnecessary snacks (to snack only to correct hypoglycemia).  - he will be scheduled with Jearld Fenton, RDN, CDE for diabetes education.  - I have approached him with the following individualized plan to manage  his diabetes and patient agrees:   -He is advised to continue his Lantus 80 units SQ nightly, and Novolog 16-22 (sample provided) units TID with meals if glucose is above 90 and he is eating but to be more consistent with taking it (Specific instructions on how to titrate insulin dosage based on glucose readings given to patient in writing).  He can also continue Metformin 1000 mg ER PO daily with breakfast, Glipizide 5 mg XL daily with breakfast, and restart his Trulicity 1.5 mg SQ weekly (I sent new Rx to [facility]).  He is encouraged consistently monitor blood  glucose  4 times per day, before meals and at bedtime and report to the clinic if readings are less than 70 or greater than 300 for 3 tests in a row.  - Specific targets for  A1c;  LDL, HDL,  and Triglycerides were discussed with the patient.  2) Blood Pressure /Hypertension:   His blood pressure is not controlled to target.  He is advised to continue Norvasc 5 mg po daily, Lasix 20 mg po daily, Lisinopril 20 mg po daily, and Metoprolol 100 mg po twice daily.    3) Lipids/Hyperlipidemia:   His recent lipid panel from 02/02/22 shows uncontrolled LDL of 193 and elevated triglycerides of 261.  He is advised to continue Atorvastatin 40 mg po daily at bedtime.  Side effects and precautions discussed with him.    4)  Weight/Diet:  His Body mass index is 53.61 kg/m.-   clearly complicating his diabetes care.   he is  a candidate for modest weight loss. I discussed with him the fact that loss of 5 - 10% of his  current body weight will have the most impact on his diabetes management.  Exercise, and detailed carbohydrates information provided  -  detailed on discharge instructions.  He is a good candidate for bariatric surgery, however at this point he has no insurance.  5) Chronic Care/Health Maintenance: -he is on ACEI/ARB and Statin medications and is encouraged to initiate and continue to follow up with Ophthalmology, Dentist,  Podiatrist at least yearly or according to recommendations, and advised to stay away from smoking. I have recommended yearly flu vaccine and pneumonia vaccine at least every 5 years; moderate intensity exercise for up to 150 minutes weekly; and  sleep for at least 7 hours a day.  - he is advised to maintain close follow up with [name], PA-C for primary care needs, as well as his other providers for optimal and coordinated care.     I spent 46 minutes in the care of the patient today including review of labs from [facility], Lipids, Thyroid Function, Hematology (current and  previous including abstractions from other facilities); face-to-face time discussing  his blood glucose readings/logs, discussing hypoglycemia and hyperglycemia episodes and symptoms, medications doses, his options of short and long term treatment based on the latest standards of care / guidelines;  discussion about incorporating lifestyle medicine;  and documenting the encounter. Risk reduction counseling performed per USPSTF guidelines to reduce obesity and cardiovascular risk factors.     Please refer to Patient Instructions for Blood Glucose Monitoring and Insulin/Medications Dosing Guide"  in media tab for additional information. Please  also refer to " Patient Self Inventory" in the Media  tab for reviewed elements of pertinent patient history.  [name] participated in the discussions, expressed understanding, and voiced agreement with the above plans.  All questions were answered to his satisfaction. he is encouraged to contact clinic should he have any questions or concerns prior to his return visit.   Follow up plan: - Return in about 1 month (around 06/30/2022) for Diabetes F/U, Bring meter and logs.  Rayetta Pigg, [facility] [address] [place], [place] [address] Phone: [phone] Fax: [phone]  05/31/2022, 4:55 PM

## 2022-06-10 ENCOUNTER — Telehealth: Payer: Self-pay

## 2022-06-10 NOTE — Telephone Encounter (Signed)
Client called regarding status of CAFA. He has been approved per [employer] copy of letter printed. Called client and notified him we have letter. He states he will come by next week. Copy of letter in envelope on RN's desk.    Harrell Gave RN [name]

## 2022-06-17 ENCOUNTER — Telehealth: Payer: Self-pay

## 2022-06-17 ENCOUNTER — Other Ambulatory Visit: Payer: Self-pay | Admitting: Orthopaedic Surgery

## 2022-06-17 NOTE — Telephone Encounter (Signed)
Patient called for refill - aware that while Dr Luna Glasgow is out of clinic this week that our other providers are reviewing requests.  Patient states his regular pharmacy is out of his medication, and has verified another pharmacy which does have it available:  oxyCODONE-acetaminophen (PERCOCET) 10-325 MG tablet  [facility]

## 2022-06-17 NOTE — Telephone Encounter (Signed)
When is dr Raliegh Ip coming back   This one is 170 pills

## 2022-06-17 NOTE — Telephone Encounter (Signed)
Returned call to client. He states he has a prescription for oxycodone at [facility] he cannot afford. He is not sure of the cost, stating other retail pharmacies do not have it due to a Producer, television/film/video. Normal cost was around 26$. Explained to client we are unable through [facility] to pay for narcotic medications.  Suggested Calling to determine actual cost and also inquire if he can get a partial fill for 2 weeks and then pick up the other 2 weeks later. Unsure if this is allowed with narcotics. [facility] does not have a relationship with [facility]. [facility] pharmacy does not fill narcotics.  Client states he will call and ask of price, plan for follow up tomorrow regarding cost.  [name] RN Clara Gunn/[facility]

## 2022-06-22 ENCOUNTER — Telehealth: Payer: Self-pay

## 2022-06-22 MED ORDER — OXYCODONE-ACETAMINOPHEN 10-325 MG PO TABS: 1.0000 | ORAL_TABLET | ORAL | 0 refills | Status: DC | PRN

## 2022-06-22 NOTE — Telephone Encounter (Signed)
Oxycodone-Acetaminophen 10/325 MG Qty 170 Tablets   PATIENT USES [facility]

## 2022-06-28 ENCOUNTER — Other Ambulatory Visit: Payer: Self-pay | Admitting: Physician Assistant

## 2022-06-28 DIAGNOSIS — Z125 Encounter for screening for malignant neoplasm of prostate: Secondary | ICD-10-CM

## 2022-06-28 DIAGNOSIS — E1165 Type 2 diabetes mellitus with hyperglycemia: Secondary | ICD-10-CM

## 2022-06-28 DIAGNOSIS — I10 Essential (primary) hypertension: Secondary | ICD-10-CM

## 2022-06-28 DIAGNOSIS — E785 Hyperlipidemia, unspecified: Secondary | ICD-10-CM

## 2022-06-30 ENCOUNTER — Ambulatory Visit: Payer: Self-pay | Admitting: Nurse Practitioner

## 2022-07-12 ENCOUNTER — Encounter: Payer: Self-pay | Admitting: Nurse Practitioner

## 2022-07-12 ENCOUNTER — Ambulatory Visit (INDEPENDENT_AMBULATORY_CARE_PROVIDER_SITE_OTHER): Payer: Self-pay | Admitting: Nurse Practitioner

## 2022-07-12 VITALS — BP 142/79 | HR 98 | Ht 68.0 in | Wt 343.4 lb

## 2022-07-12 DIAGNOSIS — E1165 Type 2 diabetes mellitus with hyperglycemia: Secondary | ICD-10-CM

## 2022-07-12 DIAGNOSIS — I10 Essential (primary) hypertension: Secondary | ICD-10-CM

## 2022-07-12 DIAGNOSIS — Z91199 Patient's noncompliance with other medical treatment and regimen due to unspecified reason: Secondary | ICD-10-CM

## 2022-07-12 DIAGNOSIS — E782 Mixed hyperlipidemia: Secondary | ICD-10-CM

## 2022-07-12 LAB — POCT GLYCOSYLATED HEMOGLOBIN (HGB A1C): Hemoglobin A1C: 14.5 % — AB (ref 4.0–5.6)

## 2022-07-12 MED ORDER — TRULICITY 1.5 MG/0.5ML ~~LOC~~ SOAJ: 1.5000 mg | SUBCUTANEOUS | 3 refills | Status: DC

## 2022-07-12 NOTE — Progress Notes (Signed)
07/12/2022, 4:23 PM      Endocrinology Follow Up Visit  Subjective:    Patient ID: [name], male    DOB: December 16, 1965.  [name] is being seen in follow up for management of currently uncontrolled symptomatic diabetes requested by  [name], PA-C.   Past Medical History:  Diagnosis Date   Arthritis    Diabetes mellitus without complication ([place])    diagnosed about age 56   Gout    Hypertension    boarderline    Past Surgical History:  Procedure Laterality Date   HERNIA REPAIR  2008   MASS EXCISION Left age 56   L thigh- benign    Social History   Socioeconomic History   Marital status: Single    Spouse name: Not on file   Number of children: Not on file   Years of education: Not on file   Highest education level: Not on file  Occupational History   Not on file  Tobacco Use   Smoking status: Former    Packs/day: 0.50    Years: 5.00    Total pack years: 2.50    Types: Cigarettes    Quit date: 43    Years since quitting: 33.8   Smokeless tobacco: Never  Vaping Use   Vaping Use: Never used  Substance and Sexual Activity   Alcohol use: No   Drug use: No   Sexual activity: Not on file  Other Topics Concern   Not on file  Social History Narrative   Not on file   Social Determinants of Health   Financial Resource Strain: Not on file  Food Insecurity: Not on file  Transportation Needs: Not on file  Physical Activity: Not on file  Stress: Not on file  Social Connections: Not on file    Family History  Problem Relation Age of Onset   Cancer Mother    Diabetes Mother    Heart failure Father    Hypertension Father    Diabetes Father    Heart attack Father     Outpatient Encounter Medications as of 07/12/2022  Medication Sig   allopurinol (ZYLOPRIM) 300 MG tablet Take 1 tablet (300 mg total) by mouth daily. (Patient taking differently: Take 300 mg by mouth daily as  needed.)   amLODipine (NORVASC) 10 MG tablet Take 1 tablet (10 mg total) by mouth daily.   atorvastatin (LIPITOR) 80 MG tablet Take 1 tablet (80 mg total) by mouth daily.   cyclobenzaprine (FLEXERIL) 10 MG tablet Take 1 tablet (10 mg total) by mouth 3 (three) times daily as needed for muscle spasms.   furosemide (LASIX) 20 MG tablet Take 1 tablet (20 mg total) by mouth daily.   glipiZIDE (GLUCOTROL XL) 5 MG 24 hr tablet Take 1 tablet (5 mg total) by mouth daily with breakfast.   insulin aspart (NOVOLOG FLEXPEN) 100 UNIT/ML FlexPen Inject 16-22 Units into the skin 3 (three) times daily with meals.   insulin glargine (LANTUS) 100 UNIT/ML injection Inject 0.8 mLs (80 Units total) into the skin daily.   lisinopril (ZESTRIL) 20 MG tablet Take 1 tablet (20 mg total) by mouth daily.   metFORMIN (GLUCOPHAGE XR) 500 MG 24 hr  tablet Take 2 tablets (1,000 mg total) by mouth daily with breakfast.   metoprolol tartrate (LOPRESSOR) 100 MG tablet Take 1 tablet (100 mg total) by mouth 2 (two) times daily.   nystatin cream (MYCOSTATIN) Apply 1 application. topically 2 (two) times daily. To penile irritation.   oxyCODONE-acetaminophen (PERCOCET) 10-325 MG tablet Take 1 tablet by mouth every 4 (four) hours as needed for pain.   [DISCONTINUED] Dulaglutide (TRULICITY) 1.5 YW/7.3XT SOPN Inject 1.5 mg into the skin once a week.   Dulaglutide (TRULICITY) 1.5 GG/2.6RS SOPN Inject 1.5 mg into the skin once a week.   No facility-administered encounter medications on file as of 07/12/2022.    ALLERGIES: No Known Allergies  VACCINATION STATUS: Immunization History  Administered Date(s) Administered   Influenza,inj,Quad PF,6+ Mos 06/30/2017   Moderna Sars-Covid-2 Vaccination 04/01/2020, 04/29/2020    Diabetes He presents for his follow-up diabetic visit. He has type 2 diabetes mellitus. Onset time: He was diagnosed at approximate age of 56 years. His disease course has been fluctuating. There are no hypoglycemic  associated symptoms. Pertinent negatives for hypoglycemia include no confusion, headaches, pallor or seizures. Associated symptoms include blurred vision, fatigue, polydipsia, polyphagia, polyuria and weight loss. Pertinent negatives for diabetes include no chest pain and no weakness. There are no hypoglycemic complications. Symptoms are improving. There are no diabetic complications. Risk factors for coronary artery disease include dyslipidemia, diabetes mellitus, hypertension, male sex, obesity and sedentary lifestyle. Current diabetic treatment includes intensive insulin program and oral agent (dual therapy). He is compliant with treatment most of the time. His weight is decreasing steadily. He is following a generally unhealthy diet. When asked about meal planning, he reported none. He has not had a previous visit with a dietitian. He never participates in exercise. His home blood glucose trend is fluctuating dramatically. His overall blood glucose range is >200 mg/dl. (He presents today with his meter, no logs, showing fluctuating glycemic profile with improved readings but still hyperglycemic overall.  His POCT A1c today is 14.5%, improving slightly from last visit of >15%.  Analysis of his meter shows inconsistent glucose monitoring with 7-day average of 374, 14-day average of 377, 30-day average of 374.  He denies any hypoglycemia.  He does note he has forgotten his Lantus several times as he has a tendency to fall asleep after supper.) An ACE inhibitor/angiotensin II receptor blocker is being taken. He does not see a podiatrist.Eye exam is current.  Hyperlipidemia This is a chronic problem. The current episode started more than 1 year ago. The problem is uncontrolled. Recent lipid tests were reviewed and are high. Exacerbating diseases include diabetes and obesity. Factors aggravating his hyperlipidemia include beta blockers and fatty foods. Pertinent negatives include no chest pain, myalgias or shortness  of breath. Current antihyperlipidemic treatment includes statins. The current treatment provides moderate improvement of lipids. Compliance problems include adherence to diet, adherence to exercise and medication cost.  Risk factors for coronary artery disease include dyslipidemia, diabetes mellitus, family history, hypertension, male sex, obesity and a sedentary lifestyle.  Hypertension This is a chronic problem. The current episode started more than 1 year ago. The problem has been resolved since onset. The problem is controlled. Associated symptoms include blurred vision. Pertinent negatives include no chest pain, headaches, malaise/fatigue, neck pain, palpitations or shortness of breath. There are no associated agents to hypertension. Risk factors for coronary artery disease include dyslipidemia, diabetes mellitus, male gender, obesity and sedentary lifestyle. Past treatments include ACE inhibitors, beta blockers, calcium channel blockers and  diuretics. The current treatment provides mild improvement. Compliance problems include medication cost, diet and exercise.     Review of systems  Constitutional: + decreasing body weight,  current Body mass index is 52.21 kg/m., + fatigue, no subjective hyperthermia, no subjective hypothermia Eyes: + blurry vision, no xerophthalmia ENT: no sore throat, no nodules palpated in throat, no dysphagia/odynophagia, no hoarseness Cardiovascular: no chest pain, no shortness of breath, no palpitations, no leg swelling Respiratory: no cough, no shortness of breath Gastrointestinal: no nausea/vomiting/diarrhea Musculoskeletal: + generalized muscle/joint aches Skin: no rashes, no hyperemia Neurological: no tremors, no numbness, no tingling, no dizziness Psychiatric: no depression, no anxiety    Objective:    BP (!) 142/79 (BP Location: Left Arm, Patient Position: Sitting, Cuff Size: Large)   Pulse 98   Ht '5\' 8"'$  (1.727 m)   Wt (!) 343 lb 6.4 oz (155.8 kg)    BMI 52.21 kg/m   Wt Readings from Last 3 Encounters:  07/12/22 (!) 343 lb 6.4 oz (155.8 kg)  05/31/22 (!) 352 lb 9.6 oz (159.9 kg)  05/05/22 (!) 345 lb (156.5 kg)    BP Readings from Last 3 Encounters:  07/12/22 (!) 142/79  05/31/22 (!) 150/83  05/05/22 (!) 140/90    Physical Exam- Limited  Constitutional:  Body mass index is 52.21 kg/m. , not in acute distress, normal state of mind Eyes:  EOMI, no exophthalmos Neck: Supple Cardiovascular: RRR, no murmurs, rubs, or gallops, no edema Respiratory: Adequate breathing efforts, no crackles, rales, rhonchi, or wheezing Musculoskeletal: no gross deformities, strength intact in all four extremities, no gross restriction of joint movements Skin:  no rashes, no hyperemia Neurological: no tremor with outstretched hands   CMP ( most recent) CMP     Component Value Date/Time   NA 136 05/04/2022 0947   K 4.2 05/04/2022 0947   CL 103 05/04/2022 0947   CO2 26 05/04/2022 0947   GLUCOSE 342 (H) 05/04/2022 0947   BUN 11 05/04/2022 0947   CREATININE 0.73 05/04/2022 0947   CREATININE 1.42 (H) 09/08/2017 1540   CALCIUM 9.1 05/04/2022 0947   PROT 7.4 05/04/2022 0947   ALBUMIN 3.3 (L) 05/04/2022 0947   AST 20 05/04/2022 0947   ALT 27 05/04/2022 0947   ALKPHOS 208 (H) 05/04/2022 0947   BILITOT 0.8 05/04/2022 0947   GFRNONAA >60 05/04/2022 0947   GFRAA >60 03/17/2020 1200     Diabetic Labs (most recent): Lab Results  Component Value Date   HGBA1C 14.5 (A) 07/12/2022   HGBA1C >15 04/12/2022   HGBA1C >15.5 (H) 09/28/2021   MICROALBUR <3.0 (H) 07/07/2021   MICROALBUR 80 02/26/2021   MICROALBUR 13.3 (H) 10/05/2019     Lipid Panel ( most recent) Lipid Panel     Component Value Date/Time   CHOL 255 (H) 05/04/2022 0946   TRIG 150 (H) 05/04/2022 0946   HDL 47 05/04/2022 0946   CHOLHDL 5.4 05/04/2022 0946   VLDL 30 05/04/2022 0946   LDLCALC 178 (H) 05/04/2022 0946      Lab Results  Component Value Date   TSH 0.532 06/28/2017        Assessment & Plan:   1) Uncontrolled type 2 diabetes mellitus with hyperglycemia (HCC)  - [name] has currently uncontrolled symptomatic type 2 DM since  56 years of age.  He presents today with his meter, no logs, showing fluctuating glycemic profile with improved readings but still hyperglycemic overall.  His POCT A1c today is 14.5%, improving slightly from last  visit of >15%.  Analysis of his meter shows inconsistent glucose monitoring with 7-day average of 374, 14-day average of 377, 30-day average of 374.  He denies any hypoglycemia.  He does note he has forgotten his Lantus several times as he has a tendency to fall asleep after supper.  -Recent labs reviewed.  - I had a long discussion with him about the progressive nature of diabetes and the pathology behind its complications. -his diabetes is complicated by obesity/sedentary life, inadequate insurance coverage,  And he remains at a high risk for more acute and chronic complications which include CAD, CVA, CKD, retinopathy, and neuropathy. These are all discussed in detail with him.  - Nutritional counseling repeated at each appointment due to patients tendency to fall back in to old habits.  - The patient admits there is a room for improvement in their diet and drink choices. -  Suggestion is made for the patient to avoid simple carbohydrates from their diet including Cakes, Sweet Desserts / Pastries, Ice Cream, Soda (diet and regular), Sweet Tea, Candies, Chips, Cookies, Sweet Pastries, Store Bought Juices, Alcohol in Excess of 1-2 drinks a day, Artificial Sweeteners, Coffee Creamer, and "Sugar-free" Products. This will help patient to have stable blood glucose profile and potentially avoid unintended weight gain.   - I encouraged the patient to switch to unprocessed or minimally processed complex starch and increased protein intake (animal or plant source), fruits, and vegetables.   - Patient is advised to stick to a  routine mealtimes to eat 3 meals a day and avoid unnecessary snacks (to snack only to correct hypoglycemia).  - he will be scheduled with Jearld Fenton, RDN, CDE for diabetes education.  - I have approached him with the following individualized plan to manage  his diabetes and patient agrees:   -He is advised to continue his Lantus 80 units SQ nightly (but to start taking it at supper since he tends to forget), and Novolog 16-22 (sample provided) units TID with meals if glucose is above 90 and he is eating but to be more consistent with taking it (Specific instructions on how to titrate insulin dosage based on glucose readings given to patient in writing).  He can also continue Metformin 1000 mg ER PO daily with breakfast, Glipizide 5 mg XL daily with breakfast, and Trulicity 1.5 mg SQ weekly.  He brought his Trulicity pens with him today as he has had trouble injecting.  We did one injection in the room today (he had not been removing the safety cap previously).  He is encouraged consistently monitor blood glucose 4 times per day, before meals and at bedtime and report to the clinic if readings are less than 70 or greater than 300 for 3 tests in a row.  - Specific targets for  A1c;  LDL, HDL,  and Triglycerides were discussed with the patient.  2) Blood Pressure /Hypertension:   His blood pressure is not controlled to target.  He is advised to continue Norvasc 5 mg po daily, Lasix 20 mg po daily, Lisinopril 20 mg po daily, and Metoprolol 100 mg po twice daily.    3) Lipids/Hyperlipidemia:   His recent lipid panel from 02/02/22 shows uncontrolled LDL of 193 and elevated triglycerides of 261.  He is advised to continue Atorvastatin 40 mg po daily at bedtime.  Side effects and precautions discussed with him.    4)  Weight/Diet:  His Body mass index is 52.21 kg/m.-   clearly complicating his diabetes care.  he is  a candidate for modest weight loss. I discussed with him the fact that loss of 5 -  10% of his  current body weight will have the most impact on his diabetes management.  Exercise, and detailed carbohydrates information provided  -  detailed on discharge instructions.  He is a good candidate for bariatric surgery, however at this point he has no insurance.  5) Chronic Care/Health Maintenance: -he is on ACEI/ARB and Statin medications and is encouraged to initiate and continue to follow up with Ophthalmology, Dentist,  Podiatrist at least yearly or according to recommendations, and advised to stay away from smoking. I have recommended yearly flu vaccine and pneumonia vaccine at least every 5 years; moderate intensity exercise for up to 150 minutes weekly; and  sleep for at least 7 hours a day.  - he is advised to maintain close follow up with [name], PA-C for primary care needs, as well as his other providers for optimal and coordinated care.      I spent 34 minutes in the care of the patient today including review of labs from [place], Lipids, Thyroid Function, Hematology (current and previous including abstractions from other facilities); face-to-face time discussing  his blood glucose readings/logs, discussing hypoglycemia and hyperglycemia episodes and symptoms, medications doses, his options of short and long term treatment based on the latest standards of care / guidelines;  discussion about incorporating lifestyle medicine;  and documenting the encounter. Risk reduction counseling performed per USPSTF guidelines to reduce obesity and cardiovascular risk factors.     Please refer to Patient Instructions for Blood Glucose Monitoring and Insulin/Medications Dosing Guide"  in media tab for additional information. Please  also refer to " Patient Self Inventory" in the Media  tab for reviewed elements of pertinent patient history.  [name] participated in the discussions, expressed understanding, and voiced agreement with the above plans.  All questions were answered to his  satisfaction. he is encouraged to contact clinic should he have any questions or concerns prior to his return visit.   Follow up plan: - Return in about 3 months (around 10/12/2022) for Diabetes F/U with A1c in office, No previsit labs, Bring meter and logs.  Rayetta Pigg, [facility] [address] [place], [place] [address] Phone: [phone] Fax: [phone]  07/12/2022, 4:23 PM

## 2022-07-13 ENCOUNTER — Encounter: Payer: Self-pay | Admitting: Orthopaedic Surgery

## 2022-07-13 ENCOUNTER — Ambulatory Visit (INDEPENDENT_AMBULATORY_CARE_PROVIDER_SITE_OTHER): Payer: Self-pay | Admitting: Orthopaedic Surgery

## 2022-07-13 DIAGNOSIS — G8929 Other chronic pain: Secondary | ICD-10-CM

## 2022-07-13 DIAGNOSIS — M25562 Pain in left knee: Secondary | ICD-10-CM

## 2022-07-13 DIAGNOSIS — M25561 Pain in right knee: Secondary | ICD-10-CM

## 2022-07-13 DIAGNOSIS — M1A09X0 Idiopathic chronic gout, multiple sites, without tophus (tophi): Secondary | ICD-10-CM

## 2022-07-13 DIAGNOSIS — Z6841 Body Mass Index (BMI) 40.0 and over, adult: Secondary | ICD-10-CM

## 2022-07-13 NOTE — Progress Notes (Signed)
My knees are worse.  He has very bad DJD of both knees and they are getting worse.  He has problems just standing and walking now.  He has no new trauma.  He is a diabetic.  I have recommended he talk to his family doctor about the new medicine that is for diabetics that also helps you lose weight.  He will do this.  Both knees have marked crepitus, effusion, ROM right 0 to 90 and left 0 to 95, limp more to the right.  NV intact.    Encounter Diagnoses  Name Primary?   Chronic pain of right knee Yes   Chronic pain of left knee    Morbid obesity due to excess calories (HCC)    Body mass index 60.0-69.9, adult (HCC)    Idiopathic chronic gout of multiple sites without tophus    I will see him in three months.  Call if any problem.  Precautions discussed.  Electronically Glandorf, MD 11/7/20233:05 PM

## 2022-07-13 NOTE — Patient Instructions (Signed)
DISCUSS A WEEKLY WEIGHT LOSS INJECTION TO HELP YOU LOSE WEIGHT DUE TO YOUR CHRONIC KNEE PAIN  Dr.Keeling is here all day on Tuesdays, Wednesday mornings, and Thursday mornings. If you need anything such as a medication refill, please either call BEFORE the end of the day on [employer] or send a message through [place]. Your pharmacy can send a refill request for you. Calling by the end of the day on [facility] allows [place] time to send Dr.Keeling the request and for him to respond before he leaves on Thursdays.  If Dr. Luna Glasgow is out of the office, we may send it to one of the other providers and they may not refill it for the same amount that your original prescription is for.   As the weather changes and gets cooler, you may notice you are affected more. You may have more pain in your joints. This is normal. Dress warmly and make sure that area is covered well.

## 2022-07-14 ENCOUNTER — Other Ambulatory Visit: Payer: Self-pay | Admitting: Physician Assistant

## 2022-07-14 ENCOUNTER — Ambulatory Visit: Payer: Self-pay | Admitting: Physician Assistant

## 2022-07-19 ENCOUNTER — Other Ambulatory Visit (HOSPITAL_COMMUNITY)
Admission: RE | Admit: 2022-07-19 | Discharge: 2022-07-19 | Disposition: A | Discharge status: Home / Self Care | Payer: Self-pay | Source: Ambulatory Visit | Attending: Physician Assistant | Admitting: Physician Assistant

## 2022-07-19 DIAGNOSIS — E785 Hyperlipidemia, unspecified: Secondary | ICD-10-CM | POA: Insufficient documentation

## 2022-07-19 DIAGNOSIS — Z125 Encounter for screening for malignant neoplasm of prostate: Secondary | ICD-10-CM | POA: Insufficient documentation

## 2022-07-19 DIAGNOSIS — I10 Essential (primary) hypertension: Secondary | ICD-10-CM | POA: Insufficient documentation

## 2022-07-19 DIAGNOSIS — E1165 Type 2 diabetes mellitus with hyperglycemia: Secondary | ICD-10-CM | POA: Insufficient documentation

## 2022-07-19 LAB — COMPREHENSIVE METABOLIC PANEL
ALT: 35 U/L (ref 0–44)
AST: 27 U/L (ref 15–41)
Albumin: 3.5 g/dL (ref 3.5–5.0)
Alkaline Phosphatase: 259 U/L — ABNORMAL HIGH (ref 38–126)
Anion gap: 12 (ref 5–15)
BUN: 12 mg/dL (ref 6–20)
CO2: 24 mmol/L (ref 22–32)
Calcium: 9.3 mg/dL (ref 8.9–10.3)
Chloride: 102 mmol/L (ref 98–111)
Creatinine, Ser: 1 mg/dL (ref 0.61–1.24)
GFR, Estimated: >60 mL/min (ref >60)
Glucose, Bld: 156 mg/dL — ABNORMAL HIGH (ref 70–99)
Potassium: 4.5 mmol/L (ref 3.5–5.1)
Sodium: 138 mmol/L (ref 135–145)
Total Bilirubin: 1.1 mg/dL (ref 0.3–1.2)
Total Protein: 8.2 g/dL — ABNORMAL HIGH (ref 6.5–8.1)

## 2022-07-19 LAB — LIPID PANEL
Cholesterol: 263 mg/dL — ABNORMAL HIGH (ref 0–200)
HDL: 62 mg/dL (ref >40)
LDL Cholesterol: 186 mg/dL — ABNORMAL HIGH (ref 0–99)
Total CHOL/HDL Ratio: 4.2 RATIO
Triglycerides: 77 mg/dL (ref <150)
VLDL: 15 mg/dL (ref 0–40)

## 2022-07-19 LAB — PSA: Prostatic Specific Antigen: 0.27 ng/mL (ref 0.00–4.00)

## 2022-07-20 ENCOUNTER — Telehealth: Payer: Self-pay

## 2022-07-20 ENCOUNTER — Telehealth: Payer: Self-pay | Admitting: Radiology

## 2022-07-20 ENCOUNTER — Encounter: Payer: Self-pay | Admitting: Physician Assistant

## 2022-07-20 ENCOUNTER — Ambulatory Visit: Payer: Self-pay | Admitting: Physician Assistant

## 2022-07-20 VITALS — BP 144/94 | HR 109 | Temp 97.0°F | Ht 68.0 in | Wt 332.0 lb

## 2022-07-20 DIAGNOSIS — I10 Essential (primary) hypertension: Secondary | ICD-10-CM

## 2022-07-20 DIAGNOSIS — R748 Abnormal levels of other serum enzymes: Secondary | ICD-10-CM

## 2022-07-20 DIAGNOSIS — R7989 Other specified abnormal findings of blood chemistry: Secondary | ICD-10-CM

## 2022-07-20 DIAGNOSIS — G8929 Other chronic pain: Secondary | ICD-10-CM

## 2022-07-20 DIAGNOSIS — L84 Corns and callosities: Secondary | ICD-10-CM

## 2022-07-20 DIAGNOSIS — E785 Hyperlipidemia, unspecified: Secondary | ICD-10-CM

## 2022-07-20 DIAGNOSIS — K0889 Other specified disorders of teeth and supporting structures: Secondary | ICD-10-CM

## 2022-07-20 DIAGNOSIS — L602 Onychogryphosis: Secondary | ICD-10-CM

## 2022-07-20 DIAGNOSIS — E1165 Type 2 diabetes mellitus with hyperglycemia: Secondary | ICD-10-CM

## 2022-07-20 LAB — MICROALBUMIN, URINE: Microalb, Ur: 7.2 ug/mL — ABNORMAL HIGH

## 2022-07-20 MED ORDER — OXYCODONE-ACETAMINOPHEN 10-325 MG PO TABS: 1.0000 | ORAL_TABLET | ORAL | 0 refills | Status: DC | PRN

## 2022-07-20 MED ORDER — AMOXICILLIN 500 MG PO CAPS: 500.0000 mg | ORAL_CAPSULE | Freq: Three times a day (TID) | ORAL | 0 refills | Status: AC

## 2022-07-20 NOTE — Telephone Encounter (Signed)
Patient called requested refill oxycodone. [facility].

## 2022-07-20 NOTE — Telephone Encounter (Signed)
Client called asking about dental referral list, he was referred in May 2023 and was not urgent. Reviewed process with client. He is seeing his PCP today and encouraged him to discuss his dental need. Only providers can make a referral urgent. He states understanding. He states his trulicity seems to be working, his numbers are slowly decreasing. Today FBS was 263. He does need more strips and will come by this week to pick up some from [facility].  Discussed with client about applying for Medicaid, he did receive the letter that [facility] mailed out . Encouraged client to go apply since expansion begins Aug 06 2022. Discussed the benefits with him. He states he will go to DSS soon to apply.   [employer]

## 2022-07-20 NOTE — Progress Notes (Unsigned)
BP (!) 144/94   Pulse (!) 109   Temp (!) 97 F (36.1 C) (Oral)   Ht _0  (1.727 m)   Wt (!) 332 lb (150.6 kg)   SpO2 99%   BMI 50.48 kg/m    Subjective:    Patient ID: [name], male    DOB: 1966-09-04, 56 y.o.   MRN: [id number]  HPI: [name] is a 56 y.o. male presenting on 07/20/2022 for Hypertension and Hyperlipidemia   HPI  Chief Complaint  Patient presents with   Hypertension   Hyperlipidemia    Pt is 60yoM who presents for routine follow up.    He says his stress level is improving.  At his Last appointment here 05/05/22- weight 345 lb.  Today 332 lb.   He says his trulicity is helping curb his appetite.  He says binge eating is no longer an issue for him.   He is still seeing endocrinologist for management of his diabetes.  He is still seeing orthopedics for his knee.  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  He went to eye doctor in September.  Those notes have not yet been received but he says everything was fine.  Pt reports that he has Tooth pain which has been worse past week or so-  lower left  Pt admits that he has not been taking his atorvastatin because he thinks he takes too many pills.    Relevant past medical, surgical, family and social history reviewed and updated as indicated. Interim medical history since our last visit reviewed. Allergies and medications reviewed and updated.   Current Outpatient Medications:    allopurinol (ZYLOPRIM) 300 MG tablet, Take 1 tablet (300 mg total) by mouth daily. (Patient taking differently: Take 300 mg by mouth daily as needed.), Disp: 30 tablet, Rfl: 5   amLODipine (NORVASC) 10 MG tablet, Take 1 tablet (10 mg total) by mouth daily., Disp: 90 tablet, Rfl: 0   atorvastatin (LIPITOR) 80 MG tablet, Take 1 tablet (80 mg total) by mouth daily., Disp: 90 tablet, Rfl: 0   cyclobenzaprine (FLEXERIL) 10 MG tablet, Take 1 tablet  (10 mg total) by mouth 3 (three) times daily as needed for muscle spasms., Disp: 30 tablet, Rfl: 0   Dulaglutide (TRULICITY) 1.5 ER/1.5QM SOPN, Inject 1.5 mg into the skin once a week., Disp: 6 mL, Rfl: 3   furosemide (LASIX) 20 MG tablet, Take 1 tablet (20 mg total) by mouth daily., Disp: 30 tablet, Rfl: 1   glipiZIDE (GLUCOTROL XL) 5 MG 24 hr tablet, Take 1 tablet (5 mg total) by mouth daily with breakfast., Disp: 90 tablet, Rfl: 3   insulin aspart (NOVOLOG FLEXPEN) 100 UNIT/ML FlexPen, Inject 16-22 Units into the skin 3 (three) times daily with meals., Disp: 45 mL, Rfl: 3   insulin glargine (LANTUS) 100 UNIT/ML injection, Inject 0.8 mLs (80 Units total) into the skin daily., Disp: 60 mL, Rfl: 3   lisinopril (ZESTRIL) 20 MG tablet, Take 1 tablet (20 mg total) by mouth daily., Disp: 90 tablet, Rfl: 0   metFORMIN (GLUCOPHAGE XR) 500 MG 24 hr tablet, Take 2 tablets (1,000 mg total) by mouth daily with breakfast., Disp: 180 tablet, Rfl: 0   metoprolol tartrate (LOPRESSOR) 100 MG tablet, Take 1 tablet (100 mg total) by mouth 2 (two) times daily., Disp: 180 tablet, Rfl: 0   oxyCODONE-acetaminophen (PERCOCET) 10-325 MG tablet, Take 1 tablet by  mouth every 4 (four) hours as needed for pain., Disp: 170 tablet, Rfl: 0   nystatin cream (MYCOSTATIN), Apply 1 application. topically 2 (two) times daily. To penile irritation. (Patient not taking: Reported on 07/20/2022), Disp: 30 g, Rfl: 3      Review of Systems  Per HPI unless specifically indicated above     Objective:    BP (!) 144/94   Pulse (!) 109   Temp (!) 97 F (36.1 C) (Oral)   Ht _0  (1.727 m)   Wt (!) 332 lb (150.6 kg)   SpO2 99%   BMI 50.48 kg/m   Wt Readings from Last 3 Encounters:  07/20/22 (!) 332 lb (150.6 kg)  07/12/22 (!) 343 lb 6.4 oz (155.8 kg)  05/31/22 (!) 352 lb 9.6 oz (159.9 kg)    Physical Exam Vitals reviewed.  Constitutional:      General: He is not in acute distress.    Appearance: He is well-developed. He  is obese. He is not toxic-appearing.  HENT:     Head: Normocephalic and atraumatic.     Mouth/Throat:     Dentition: Abnormal dentition. Dental tenderness present.     Comments: Left lower molar broken as pictured.  No swelling of the face.  No abscess seen.  Cardiovascular:     Rate and Rhythm: Normal rate and regular rhythm.  Pulmonary:     Effort: Pulmonary effort is normal.     Breath sounds: Normal breath sounds. No wheezing.  Abdominal:     General: Bowel sounds are normal.     Palpations: Abdomen is soft.     Tenderness: There is no abdominal tenderness.  Musculoskeletal:     Cervical back: Neck supple.     Right lower leg: No edema.     Left lower leg: No edema.  Feet:     Comments: Toenails still very long, foot skin very very thick.  No open wounds of the foot.  Lymphadenopathy:     Cervical: No cervical adenopathy.  Skin:    General: Skin is warm and dry.  Neurological:     Mental Status: He is alert and oriented to person, place, and time.  Psychiatric:        Behavior: Behavior normal.        Results for orders placed or performed during the hospital encounter of 07/19/22  PSA  Result Value Ref Range   Prostatic Specific Antigen 0.27 0.00 - 4.00 ng/mL  Lipid panel  Result Value Ref Range   Cholesterol 263 (H) 0 - 200 mg/dL   Triglycerides 77 <150 mg/dL   HDL 62 >40 mg/dL   Total CHOL/HDL Ratio 4.2 RATIO   VLDL 15 0 - 40 mg/dL   LDL Cholesterol 186 (H) 0 - 99 mg/dL  Comprehensive metabolic panel  Result Value Ref Range   Sodium 138 135 - 145 mmol/L   Potassium 4.5 3.5 - 5.1 mmol/L   Chloride 102 98 - 111 mmol/L   CO2 24 22 - 32 mmol/L   Glucose, Bld 156 (H) 70 - 99 mg/dL   BUN 12 6 - 20 mg/dL   Creatinine, Ser 1.00 0.61 - 1.24 mg/dL   Calcium 9.3 8.9 - 10.3 mg/dL   Total Protein 8.2 (H) 6.5 - 8.1 g/dL   Albumin 3.5 3.5 - 5.0 g/dL   AST 27 15 - 41 U/L   ALT 35 0 - 44 U/L   Alkaline Phosphatase 259 (H) 38 - 126 U/L   Total  Bilirubin 1.1 0.3 -  1.2 mg/dL   GFR, Estimated >60 >60 mL/min   Anion gap 12 5 - 15            Assessment & Plan:    Encounter Diagnoses  Name Primary?   Essential hypertension Yes   Hyperlipidemia, unspecified hyperlipidemia type    Uncontrolled type 2 diabetes mellitus with hyperglycemia (HCC)    Class 3 severe obesity with body mass index (BMI) of 50.0 to 59.9 in adult, unspecified obesity type, unspecified whether serious comorbidity present (HCC)    Elevated LFTs    Overgrown toenails    Corn of foot    Chronic pain of both knees    Dentalgia    Alkaline phosphatase elevation      -reviewed labs with pt -educated pt of reasons for takiing a statin.  He will Resume taking atorvastatin for dyslipidemia -will Monitor alk phos -Discussed bp.  Will add more if not at goal next time.  He prefers to not have more meds at this time and he is continuing to lose weight which should help the bp.   -pt to continue with endocrinologist for diabetes management -pt was given application for his novolog.  He is to complete it and return it here so it can be sent to mfg to get patient's novolog -rx amoxil for his tooth.  He is referred to dentist -pt congratulated on his weight loss efforts and is encouraged to continue -pt to follow up 3 months.  He is to contact office sooner prn

## 2022-07-20 NOTE — Telephone Encounter (Signed)
REQUEST SENT TO PROVIDER

## 2022-07-27 NOTE — Telephone Encounter (Signed)
DONE

## 2022-08-12 ENCOUNTER — Ambulatory Visit: Payer: Self-pay | Admitting: Urology

## 2022-08-12 ENCOUNTER — Telehealth: Payer: Self-pay

## 2022-08-12 NOTE — Telephone Encounter (Signed)
Made patient aware need to rescheduled ov today. Patient voiced understanding

## 2022-08-13 ENCOUNTER — Telehealth: Payer: Self-pay

## 2022-08-13 NOTE — Telephone Encounter (Signed)
Called client for follow up. Previous discussion had discussed that he should apply for Medicaid as Medicaid has expanded now. This would benefit him with copayment etc. He states he has not applied he has had car issues but plans to apply Monday. Discussed other ways to apply as online, in person and by phone. Client reports blood sugars are improving shares that this morning his blood sugars was 198.  Will continue to follow up and and continue to encourage client to apply for medicaid.   [employer]

## 2022-08-17 ENCOUNTER — Telehealth: Payer: Self-pay

## 2022-08-17 MED ORDER — OXYCODONE-ACETAMINOPHEN 10-325 MG PO TABS: 1.0000 | ORAL_TABLET | ORAL | 0 refills | Status: DC | PRN

## 2022-08-17 NOTE — Telephone Encounter (Signed)
Oxycodone-Acetaminophen 10/325 MG  Qty 170 Tablets  PATIENT USES [facility]

## 2022-08-24 ENCOUNTER — Emergency Department (HOSPITAL_COMMUNITY): Payer: Self-pay

## 2022-08-24 ENCOUNTER — Other Ambulatory Visit: Payer: Self-pay

## 2022-08-24 ENCOUNTER — Encounter (HOSPITAL_COMMUNITY): Payer: Self-pay

## 2022-08-24 ENCOUNTER — Emergency Department (HOSPITAL_COMMUNITY)
Admission: EM | Admit: 2022-08-24 | Discharge: 2022-08-24 | Disposition: A | Discharge status: Home / Self Care | Payer: Self-pay | Attending: Emergency Medicine | Admitting: Emergency Medicine

## 2022-08-24 DIAGNOSIS — R112 Nausea with vomiting, unspecified: Secondary | ICD-10-CM | POA: Insufficient documentation

## 2022-08-24 DIAGNOSIS — E119 Type 2 diabetes mellitus without complications: Secondary | ICD-10-CM | POA: Insufficient documentation

## 2022-08-24 DIAGNOSIS — I10 Essential (primary) hypertension: Secondary | ICD-10-CM | POA: Insufficient documentation

## 2022-08-24 DIAGNOSIS — Z7984 Long term (current) use of oral hypoglycemic drugs: Secondary | ICD-10-CM | POA: Insufficient documentation

## 2022-08-24 DIAGNOSIS — Z794 Long term (current) use of insulin: Secondary | ICD-10-CM | POA: Insufficient documentation

## 2022-08-24 DIAGNOSIS — Z20822 Contact with and (suspected) exposure to covid-19: Secondary | ICD-10-CM | POA: Insufficient documentation

## 2022-08-24 DIAGNOSIS — Z79899 Other long term (current) drug therapy: Secondary | ICD-10-CM | POA: Insufficient documentation

## 2022-08-24 DIAGNOSIS — R110 Nausea: Secondary | ICD-10-CM

## 2022-08-24 DIAGNOSIS — R1084 Generalized abdominal pain: Secondary | ICD-10-CM | POA: Insufficient documentation

## 2022-08-24 LAB — COMPREHENSIVE METABOLIC PANEL
ALT: 55 U/L — ABNORMAL HIGH (ref 0–44)
AST: 32 U/L (ref 15–41)
Albumin: 3.8 g/dL (ref 3.5–5.0)
Alkaline Phosphatase: 300 U/L — ABNORMAL HIGH (ref 38–126)
Anion gap: 10 (ref 5–15)
BUN: 11 mg/dL (ref 6–20)
CO2: 24 mmol/L (ref 22–32)
Calcium: 9.3 mg/dL (ref 8.9–10.3)
Chloride: 102 mmol/L (ref 98–111)
Creatinine, Ser: 0.73 mg/dL (ref 0.61–1.24)
GFR, Estimated: >60 mL/min (ref >60)
Glucose, Bld: 176 mg/dL — ABNORMAL HIGH (ref 70–99)
Potassium: 4.3 mmol/L (ref 3.5–5.1)
Sodium: 136 mmol/L (ref 135–145)
Total Bilirubin: 1 mg/dL (ref 0.3–1.2)
Total Protein: 8.7 g/dL — ABNORMAL HIGH (ref 6.5–8.1)

## 2022-08-24 LAB — URINALYSIS, ROUTINE W REFLEX MICROSCOPIC
Bilirubin Urine: NEGATIVE
Glucose, UA: NEGATIVE mg/dL
Hgb urine dipstick: NEGATIVE
Ketones, ur: 20 mg/dL — AB
Leukocytes,Ua: NEGATIVE
Nitrite: NEGATIVE
Protein, ur: 30 mg/dL — AB
Specific Gravity, Urine: <1.005 — ABNORMAL LOW (ref 1.005–1.030)
pH: 6 (ref 5.0–8.0)

## 2022-08-24 LAB — CBC
HCT: 48.3 % (ref 39.0–52.0)
Hemoglobin: 15.5 g/dL (ref 13.0–17.0)
MCH: 27.3 pg (ref 26.0–34.0)
MCHC: 32.1 g/dL (ref 30.0–36.0)
MCV: 85.2 fL (ref 80.0–100.0)
Platelets: 333 10*3/uL (ref 150–400)
RBC: 5.67 MIL/uL (ref 4.22–5.81)
RDW: 14.1 % (ref 11.5–15.5)
WBC: 14.6 10*3/uL — ABNORMAL HIGH (ref 4.0–10.5)
nRBC: 0 % (ref 0.0–0.2)

## 2022-08-24 LAB — MAGNESIUM: Magnesium: 2.4 mg/dL (ref 1.7–2.4)

## 2022-08-24 LAB — CBG MONITORING, ED: Glucose-Capillary: 173 mg/dL — ABNORMAL HIGH (ref 70–99)

## 2022-08-24 LAB — LIPASE, BLOOD: Lipase: 33 U/L (ref 11–51)

## 2022-08-24 LAB — RESP PANEL BY RT-PCR (RSV, FLU A&B, COVID)  RVPGX2
Influenza A by PCR: NEGATIVE
Influenza B by PCR: NEGATIVE
Resp Syncytial Virus by PCR: NEGATIVE
SARS Coronavirus 2 by RT PCR: NEGATIVE

## 2022-08-24 LAB — TROPONIN I (HIGH SENSITIVITY): Troponin I (High Sensitivity): 15 ng/L (ref <18)

## 2022-08-24 MED ORDER — IOHEXOL 300 MG/ML  SOLN
100.0000 mL | Freq: Once | INTRAMUSCULAR | Status: AC | PRN
  Administered 2022-08-24: 100 mL via INTRAVENOUS

## 2022-08-24 MED ORDER — METOCLOPRAMIDE HCL 5 MG/ML IJ SOLN
10.0000 mg | Freq: Once | INTRAMUSCULAR | Status: AC
  Administered 2022-08-24: 10 mg via INTRAVENOUS
  Filled 2022-08-24: qty 2

## 2022-08-24 MED ORDER — METOCLOPRAMIDE HCL 10 MG PO TABS: 10.0000 mg | ORAL_TABLET | Freq: Three times a day (TID) | ORAL | 0 refills | Status: AC | PRN

## 2022-08-24 MED ORDER — LACTATED RINGERS IV BOLUS
1000.0000 mL | Freq: Once | INTRAVENOUS | Status: AC
  Administered 2022-08-24: 1000 mL via INTRAVENOUS

## 2022-08-24 MED ORDER — HYDROMORPHONE HCL 1 MG/ML IJ SOLN
0.5000 mg | Freq: Once | INTRAMUSCULAR | Status: AC
  Administered 2022-08-24: 0.5 mg via INTRAVENOUS
  Filled 2022-08-24: qty 0.5

## 2022-08-24 MED ORDER — PANTOPRAZOLE SODIUM 40 MG PO TBEC: 40.0000 mg | DELAYED_RELEASE_TABLET | Freq: Every day | ORAL | 0 refills | Status: DC

## 2022-08-24 MED ORDER — LIDOCAINE VISCOUS HCL 2 % MT SOLN
15.0000 mL | Freq: Once | OROMUCOSAL | Status: AC
  Administered 2022-08-24: 15 mL via ORAL
  Filled 2022-08-24: qty 15

## 2022-08-24 MED ORDER — FAMOTIDINE IN NACL 20-0.9 MG/50ML-% IV SOLN
20.0000 mg | Freq: Once | INTRAVENOUS | Status: AC
  Administered 2022-08-24: 20 mg via INTRAVENOUS
  Filled 2022-08-24: qty 50

## 2022-08-24 MED ORDER — ALUM & MAG HYDROXIDE-SIMETH 200-200-20 MG/5ML PO SUSP
30.0000 mL | Freq: Once | ORAL | Status: AC
  Administered 2022-08-24: 30 mL via ORAL
  Filled 2022-08-24: qty 30

## 2022-08-24 NOTE — ED Notes (Signed)
UA was not able to get processed at lab. Will collect another urine specimen to take to lab

## 2022-08-24 NOTE — ED Provider Notes (Incomplete)
[facility] EMERGENCY DEPARTMENT Provider Note   CSN: [id number] Arrival date & time: 08/24/22  1501     History {Add pertinent medical, surgical, social history, OB history to HPI:1} Chief Complaint  Patient presents with  . Abdominal Pain    [name] is a 56 y.o. male.   Abdominal Pain Associated symptoms: constipation, nausea and vomiting   Patient presents for abdominal pain.  Medical history includes morbid obesity, T2DM, gout, HTN, HLD, arthritis.  Over the past 4 to 5 days, patient has had a mid epigastric pain.  Pain is worsened with eating.  Because of this, he has not eaten in the past 5 days.  He has continued to drink fluids.  When he does drink fluids, he will experience nausea and occasional vomiting.  Patient reports dark urine.  He has had recent constipation.  He took a stool softener and was able to have a bowel movement 2 days ago.  It was last time he moved his bowels.  His surgical history is notable for a mesh hernia repair on his abdomen.  Currently, pain is 5/10 in severity.  He denies any other recent associated symptoms.     Home Medications Prior to Admission medications   Medication Sig Start Date End Date Taking? Authorizing Provider  allopurinol (ZYLOPRIM) 300 MG tablet Take 1 tablet (300 mg total) by mouth daily. Patient taking differently: Take 300 mg by mouth daily as needed. 08/04/21   Sanjuana Kava, MD  amLODipine (NORVASC) 10 MG tablet TAKE 1 Tablet BY MOUTH ONCE EVERY DAY 07/20/22   [name], PA-C  atorvastatin (LIPITOR) 80 MG tablet TAKE 1 Tablet BY MOUTH ONCE EVERY DAY 07/20/22   [name], PA-C  cyclobenzaprine (FLEXERIL) 10 MG tablet Take 1 tablet (10 mg total) by mouth 3 (three) times daily as needed for muscle spasms. 09/28/21   [name], PA-C  Dulaglutide (TRULICITY) 1.5 TD/1.7OH SOPN Inject 1.5 mg into the skin once a week. 07/12/22   Brita Romp, NP  furosemide (LASIX) 20 MG tablet Take 1 tablet (20 mg  total) by mouth daily. 10/12/21   [name], PA-C  glipiZIDE (GLUCOTROL XL) 5 MG 24 hr tablet Take 1 tablet (5 mg total) by mouth daily with breakfast. 05/31/22   Brita Romp, NP  insulin aspart (NOVOLOG FLEXPEN) 100 UNIT/ML FlexPen Inject 16-22 Units into the skin 3 (three) times daily with meals. 05/31/22   Brita Romp, NP  insulin glargine (LANTUS) 100 UNIT/ML injection Inject 0.8 mLs (80 Units total) into the skin daily. 05/31/22   Brita Romp, NP  lisinopril (ZESTRIL) 20 MG tablet TAKE 1 Tablet BY MOUTH ONCE EVERY DAY 07/20/22   [name], PA-C  metFORMIN (GLUCOPHAGE-XR) 500 MG 24 hr tablet TAKE 2 Tablets BY MOUTH ONCE EVERY DAY WITH BREAKFAST 07/20/22   [name], PA-C  metoprolol tartrate (LOPRESSOR) 100 MG tablet TAKE 1 Tablet  BY MOUTH TWICE DAILY 07/20/22   [name], PA-C  nystatin cream (MYCOSTATIN) Apply 1 application. topically 2 (two) times daily. To penile irritation. Patient not taking: Reported on 07/20/2022 01/21/22   Irine Seal, MD  oxyCODONE-acetaminophen (PERCOCET) 10-325 MG tablet Take 1 tablet by mouth every 4 (four) hours as needed for pain. 08/17/22   Sanjuana Kava, MD      Allergies    Patient has no known allergies.    Review of Systems   Review of Systems  Constitutional:  Positive for appetite change.  Gastrointestinal:  Positive for abdominal  pain, constipation, nausea and vomiting.  All other systems reviewed and are negative.   Physical Exam Updated Vital Signs BP (!) 137/97 (BP Location: Right Arm)   Pulse (!) 104   Temp 98.1 F (36.7 C) (Oral)   Resp 16   Ht _0  (1.727 m)   Wt 136.1 kg   SpO2 98%   BMI 45.61 kg/m  Physical Exam Vitals and nursing note reviewed.  Constitutional:      General: He is not in acute distress.    Appearance: He is well-developed. He is obese. He is not ill-appearing or toxic-appearing.  HENT:     Head: Normocephalic and atraumatic.     Mouth/Throat:     Mouth: Mucous  membranes are moist.  Eyes:     General: No scleral icterus.    Extraocular Movements: Extraocular movements intact.     Conjunctiva/sclera: Conjunctivae normal.  Cardiovascular:     Rate and Rhythm: Normal rate and regular rhythm.  Pulmonary:     Effort: Pulmonary effort is normal. No respiratory distress.  Abdominal:     Palpations: Abdomen is soft.     Tenderness: There is abdominal tenderness in the epigastric area and periumbilical area. There is no guarding or rebound.  Musculoskeletal:        General: No swelling.     Cervical back: Neck supple.  Skin:    General: Skin is warm and dry.     Capillary Refill: Capillary refill takes less than 2 seconds.  Neurological:     General: No focal deficit present.     Mental Status: He is alert and oriented to person, place, and time.  Psychiatric:        Mood and Affect: Mood normal.        Behavior: Behavior normal.     ED Results / Procedures / Treatments   Labs (all labs ordered are listed, but only abnormal results are displayed) Labs Reviewed  CBC - Abnormal; Notable for the following components:      Result Value   WBC 14.6 (*)    All other components within normal limits  CBG MONITORING, ED - Abnormal; Notable for the following components:   Glucose-Capillary 173 (*)    All other components within normal limits  RESP PANEL BY RT-PCR (RSV, FLU A&B, COVID)  [id number]  LIPASE, BLOOD  COMPREHENSIVE METABOLIC PANEL  URINALYSIS, ROUTINE W REFLEX MICROSCOPIC    EKG None  Radiology No results found.  Procedures Procedures  {Document cardiac monitor, telemetry assessment procedure when appropriate:1}  Medications Ordered in ED Medications - No data to display  ED Course/ Medical Decision Making/ A&P                           Medical Decision Making Amount and/or Complexity of Data Reviewed Labs: ordered. Radiology: ordered.  Risk OTC drugs. Prescription drug management.   This patient presents to the ED  for concern of abdominal pain, nausea, and vomiting, this involves an extensive number of treatment options, and is a complaint that carries with it a high risk of complications and morbidity.  The differential diagnosis includes Esther paresis, gastritis, GERD, enteritis, colitis, SBO   Co morbidities that complicate the patient evaluation  morbid obesity, T2DM, gout, HTN, HLD, arthritis   Additional history obtained:  Additional history obtained from N/A External records from outside source obtained and reviewed including EMR   Lab Tests:  I Ordered, and personally interpreted labs.  The pertinent results include: Leukocytosis is present.  Hemoglobin is normal.  Alk phos is elevated which appears to be a chronic finding based on prior lab work.  Hepatobiliary enzymes otherwise normal.  Lipase is normal.  Electrolytes are normal.  No evidence of UTI or hematuria.   Imaging Studies ordered:  I ordered imaging studies including CT of abdomen and pelvis I independently visualized and interpreted imaging which showed no acute findings, minimal stool burden, chronic findings of hernia without obstruction I agree with the radiologist interpretation   Cardiac Monitoring: / EKG:  The patient was maintained on a cardiac monitor.  I personally viewed and interpreted the cardiac monitored which showed an underlying rhythm of: Sinus rhythm   Consultations Obtained:  I requested consultation with the ***,  and discussed lab and imaging findings as well as pertinent plan - they recommend: ***   Problem List / ED Course / Critical interventions / Medication management  *** I ordered medication including ***  for ***  Reevaluation of the patient after these medicines showed that the patient {resolved/improved/worsened:23923::"improved"} I have reviewed the patients home medicines and have made adjustments as needed   Social Determinants of Health:  ***   Test / Admission -  Considered:  ***   {Document critical care time when appropriate:1} {Document review of labs and clinical decision tools ie heart score, Chads2Vasc2 etc:1}  {Document your independent review of radiology images, and any outside records:1} {Document your discussion with family members, caretakers, and with consultants:1} {Document social determinants of health affecting pt's care:1} {Document your decision making why or why not admission, treatments were needed:1} Final Clinical Impression(s) / ED Diagnoses Final diagnoses:  None    Rx / DC Orders ED Discharge Orders     None

## 2022-08-24 NOTE — ED Triage Notes (Signed)
Patient states hx of liver problems and bacteria infection in the past and complains of abdominal pain with lack of appetite. Patient states LBM 2 days ago. Patient states dizziness.

## 2022-08-24 NOTE — ED Provider Notes (Signed)
[facility] EMERGENCY DEPARTMENT Provider Note   CSN: [id number] Arrival date & time: 08/24/22  1501     History {Add pertinent medical, surgical, social history, OB history to HPI:1} Chief Complaint  Patient presents with   Abdominal Pain    [name] is a 56 y.o. male.   Abdominal Pain Associated symptoms: constipation, nausea and vomiting   Patient presents for abdominal pain.  Medical history includes morbid obesity, T2DM, gout, HTN, HLD, arthritis.  Over the past 4 to 5 days, patient has had a mid epigastric pain.  Pain is worsened with eating.  Because of this, he has not eaten in the past 5 days.  He has continued to drink fluids.  When he does drink fluids, he will experience nausea and occasional vomiting.  Patient reports dark urine.  He has had recent constipation.  He took a stool softener and was able to have a bowel movement 2 days ago.  It was last time he moved his bowels.  His surgical history is notable for a mesh hernia repair on his abdomen.  Currently, pain is 5/10 in severity.  He denies any other recent associated symptoms.     Home Medications Prior to Admission medications   Medication Sig Start Date End Date Taking? Authorizing Provider  allopurinol (ZYLOPRIM) 300 MG tablet Take 1 tablet (300 mg total) by mouth daily. Patient taking differently: Take 300 mg by mouth daily as needed. 08/04/21   Sanjuana Kava, MD  amLODipine (NORVASC) 10 MG tablet TAKE 1 Tablet BY MOUTH ONCE EVERY DAY 07/20/22   [name], PA-C  atorvastatin (LIPITOR) 80 MG tablet TAKE 1 Tablet BY MOUTH ONCE EVERY DAY 07/20/22   [name], PA-C  cyclobenzaprine (FLEXERIL) 10 MG tablet Take 1 tablet (10 mg total) by mouth 3 (three) times daily as needed for muscle spasms. 09/28/21   [name], PA-C  Dulaglutide (TRULICITY) 1.5 ZS/0.1UX SOPN Inject 1.5 mg into the skin once a week. 07/12/22   Brita Romp, NP  furosemide (LASIX) 20 MG tablet Take 1 tablet (20 mg  total) by mouth daily. 10/12/21   [name], PA-C  glipiZIDE (GLUCOTROL XL) 5 MG 24 hr tablet Take 1 tablet (5 mg total) by mouth daily with breakfast. 05/31/22   Brita Romp, NP  insulin aspart (NOVOLOG FLEXPEN) 100 UNIT/ML FlexPen Inject 16-22 Units into the skin 3 (three) times daily with meals. 05/31/22   Brita Romp, NP  insulin glargine (LANTUS) 100 UNIT/ML injection Inject 0.8 mLs (80 Units total) into the skin daily. 05/31/22   Brita Romp, NP  lisinopril (ZESTRIL) 20 MG tablet TAKE 1 Tablet BY MOUTH ONCE EVERY DAY 07/20/22   [name], PA-C  metFORMIN (GLUCOPHAGE-XR) 500 MG 24 hr tablet TAKE 2 Tablets BY MOUTH ONCE EVERY DAY WITH BREAKFAST 07/20/22   [name], PA-C  metoprolol tartrate (LOPRESSOR) 100 MG tablet TAKE 1 Tablet  BY MOUTH TWICE DAILY 07/20/22   [name], PA-C  nystatin cream (MYCOSTATIN) Apply 1 application. topically 2 (two) times daily. To penile irritation. Patient not taking: Reported on 07/20/2022 01/21/22   Irine Seal, MD  oxyCODONE-acetaminophen (PERCOCET) 10-325 MG tablet Take 1 tablet by mouth every 4 (four) hours as needed for pain. 08/17/22   Sanjuana Kava, MD      Allergies    Patient has no known allergies.    Review of Systems   Review of Systems  Constitutional:  Positive for appetite change.  Gastrointestinal:  Positive for abdominal  pain, constipation, nausea and vomiting.  All other systems reviewed and are negative.   Physical Exam Updated Vital Signs BP (!) 137/97 (BP Location: Right Arm)   Pulse (!) 104   Temp 98.1 F (36.7 C) (Oral)   Resp 16   Ht '5\' 8"'$  (1.727 m)   Wt 136.1 kg   SpO2 98%   BMI 45.61 kg/m  Physical Exam Vitals and nursing note reviewed.  Constitutional:      General: He is not in acute distress.    Appearance: He is well-developed. He is obese. He is not ill-appearing or toxic-appearing.  HENT:     Head: Normocephalic and atraumatic.     Mouth/Throat:     Mouth: Mucous  membranes are moist.  Eyes:     General: No scleral icterus.    Extraocular Movements: Extraocular movements intact.     Conjunctiva/sclera: Conjunctivae normal.  Cardiovascular:     Rate and Rhythm: Normal rate and regular rhythm.  Pulmonary:     Effort: Pulmonary effort is normal. No respiratory distress.  Abdominal:     Palpations: Abdomen is soft.     Tenderness: There is abdominal tenderness in the epigastric area and periumbilical area. There is no guarding or rebound.  Musculoskeletal:        General: No swelling.     Cervical back: Neck supple.  Skin:    General: Skin is warm and dry.     Capillary Refill: Capillary refill takes less than 2 seconds.  Neurological:     General: No focal deficit present.     Mental Status: He is alert and oriented to person, place, and time.  Psychiatric:        Mood and Affect: Mood normal.        Behavior: Behavior normal.     ED Results / Procedures / Treatments   Labs (all labs ordered are listed, but only abnormal results are displayed) Labs Reviewed  CBC - Abnormal; Notable for the following components:      Result Value   WBC 14.6 (*)    All other components within normal limits  CBG MONITORING, ED - Abnormal; Notable for the following components:   Glucose-Capillary 173 (*)    All other components within normal limits  RESP PANEL BY RT-PCR (RSV, FLU A&B, COVID)  RVPGX2  LIPASE, BLOOD  COMPREHENSIVE METABOLIC PANEL  URINALYSIS, ROUTINE W REFLEX MICROSCOPIC    EKG None  Radiology No results found.  Procedures Procedures  {Document cardiac monitor, telemetry assessment procedure when appropriate:1}  Medications Ordered in ED Medications - No data to display  ED Course/ Medical Decision Making/ A&P                           Medical Decision Making Amount and/or Complexity of Data Reviewed Labs: ordered.   This patient presents to the ED for concern of ***, this involves an extensive number of treatment  options, and is a complaint that carries with it a high risk of complications and morbidity.  The differential diagnosis includes ***   Co morbidities that complicate the patient evaluation  ***   Additional history obtained:  Additional history obtained from *** External records from outside source obtained and reviewed including ***   Lab Tests:  I Ordered, and personally interpreted labs.  The pertinent results include:  ***   Imaging Studies ordered:  I ordered imaging studies including ***  I independently visualized and interpreted  imaging which showed *** I agree with the radiologist interpretation   Cardiac Monitoring: / EKG:  The patient was maintained on a cardiac monitor.  I personally viewed and interpreted the cardiac monitored which showed an underlying rhythm of: ***   Consultations Obtained:  I requested consultation with the ***,  and discussed lab and imaging findings as well as pertinent plan - they recommend: ***   Problem List / ED Course / Critical interventions / Medication management  *** I ordered medication including ***  for ***  Reevaluation of the patient after these medicines showed that the patient {resolved/improved/worsened:23923::"improved"} I have reviewed the patients home medicines and have made adjustments as needed   Social Determinants of Health:  ***   Test / Admission - Considered:  ***   {Document critical care time when appropriate:1} {Document review of labs and clinical decision tools ie heart score, Chads2Vasc2 etc:1}  {Document your independent review of radiology images, and any outside records:1} {Document your discussion with family members, caretakers, and with consultants:1} {Document social determinants of health affecting pt's care:1} {Document your decision making why or why not admission, treatments were needed:1} Final Clinical Impression(s) / ED Diagnoses Final diagnoses:  None    Rx / DC  Orders ED Discharge Orders     None

## 2022-08-24 NOTE — Discharge Instructions (Addendum)
There is medication that was sent to your pharmacy.  Pantoprazole is an acid lowering medication to help relieve stomach inflammation and promote stomach wall healing.  Take this daily as prescribed.  Talk to primary care doctor about long-term use.  Reglan is a medication that treats nausea.  Take this as needed.  Return to the emergency department for any new or worsening symptoms of concern.

## 2022-09-09 ENCOUNTER — Ambulatory Visit (INDEPENDENT_AMBULATORY_CARE_PROVIDER_SITE_OTHER): Payer: Self-pay | Admitting: Urology

## 2022-09-09 ENCOUNTER — Encounter: Payer: Self-pay | Admitting: Urology

## 2022-09-09 VITALS — BP 121/80 | HR 92

## 2022-09-09 DIAGNOSIS — N471 Phimosis: Secondary | ICD-10-CM

## 2022-09-09 DIAGNOSIS — R351 Nocturia: Secondary | ICD-10-CM

## 2022-09-09 DIAGNOSIS — E291 Testicular hypofunction: Secondary | ICD-10-CM

## 2022-09-09 DIAGNOSIS — N5201 Erectile dysfunction due to arterial insufficiency: Secondary | ICD-10-CM

## 2022-09-09 DIAGNOSIS — N4883 Acquired buried penis: Secondary | ICD-10-CM

## 2022-09-09 DIAGNOSIS — R350 Frequency of micturition: Secondary | ICD-10-CM

## 2022-09-09 MED ORDER — SILDENAFIL CITRATE 100 MG PO TABS: 100.0000 mg | ORAL_TABLET | Freq: Every day | ORAL | 11 refills | Status: DC | PRN

## 2022-09-09 NOTE — Progress Notes (Signed)
Subjective: 1. Acquired buried penis   2. Phimosis   3. Urinary frequency   4. Nocturia   5. Erectile dysfunction due to arterial insufficiency     09/09/22: [name] returns today in f/u.  He has continued to lose some weight and is having less of an issue with the buried penis.  He has stable LUTS with an IPSS of 22 but he attributes most of his symptoms to his diabetes.  UA is ok today.   He has ED and would like to try Viagra again.    5/18/23[name] returns today in f/u.  He has lost 30lb with diet but his glucose was 335 yesterday so he is still having frequency and nocturia.  He still has 3+ glucose in the urine today.   He has been using nystatin cream and needs a refill.  He didn't get the testosterone level that was ordered before and hasn't been able to get to [facility] to see the reconstructive urologist.    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  His PSA on 07/07/21 was stable at 0.26.   He is a very poorly controlled diabetic with a glucose of 539  and Hgb A1c of >15.5 on 09/28/21.  He hasn't had a recent testosterone level.   He is concerned today about a retracted penis and having to sit to void.   He is losing weight.  He has frequency and urgency with nocturia that is probably secondary to polydipsia from the diabetes.   UA has 3+ glucose but is otherwise clear.  ROS:  Review of Systems  All other systems reviewed and are negative.   No Known Allergies  Past Medical History:  Diagnosis Date   Arthritis    Diabetes mellitus without complication ([place])    diagnosed about age 57   Gout    Hypertension    boarderline    Past Surgical History:  Procedure Laterality Date   HERNIA REPAIR  2008   MASS EXCISION Left age 54   L thigh- benign    Social History   Socioeconomic History   Marital status: Single    Spouse name: Not on file   Number of children: Not on file   Years of education: Not on  file   Highest education level: Not on file  Occupational History   Not on file  Tobacco Use   Smoking status: Former    Packs/day: 0.50    Years: 5.00    Total pack years: 2.50    Types: Cigarettes    Quit date: 39    Years since quitting: 34.0   Smokeless tobacco: Never  Vaping Use   Vaping Use: Never used  Substance and Sexual Activity   Alcohol use: No   Drug use: No   Sexual activity: Not on file  Other Topics Concern   Not on file  Social History Narrative   Not on file   Social Determinants of Health   Financial Resource Strain: Not on file  Food Insecurity: Not on file  Transportation Needs: Not on file  Physical Activity: Not on file  Stress: Not on file  Social Connections: Not on file  Intimate Partner Violence: Not on file    Family History  Problem Relation Age of Onset   Cancer Mother    Diabetes Mother    Heart failure Father    Hypertension Father  Diabetes Father    Heart attack Father     Anti-infectives: Anti-infectives (From admission, onward)    None       Current Outpatient Medications  Medication Sig Dispense Refill   allopurinol (ZYLOPRIM) 300 MG tablet Take 1 tablet (300 mg total) by mouth daily. (Patient taking differently: Take 300 mg by mouth daily as needed.) 30 tablet 5   amLODipine (NORVASC) 10 MG tablet TAKE 1 Tablet BY MOUTH ONCE EVERY DAY 90 tablet 0   atorvastatin (LIPITOR) 80 MG tablet TAKE 1 Tablet BY MOUTH ONCE EVERY DAY 90 tablet 0   cyclobenzaprine (FLEXERIL) 10 MG tablet Take 1 tablet (10 mg total) by mouth 3 (three) times daily as needed for muscle spasms. 30 tablet 0   Dulaglutide (TRULICITY) 1.5 PP/2.9JJ SOPN Inject 1.5 mg into the skin once a week. 6 mL 3   furosemide (LASIX) 20 MG tablet Take 1 tablet (20 mg total) by mouth daily. 30 tablet 1   glipiZIDE (GLUCOTROL XL) 5 MG 24 hr tablet Take 1 tablet (5 mg total) by mouth daily with breakfast. 90 tablet 3   insulin aspart (NOVOLOG FLEXPEN) 100 UNIT/ML  FlexPen Inject 16-22 Units into the skin 3 (three) times daily with meals. 45 mL 3   insulin glargine (LANTUS) 100 UNIT/ML injection Inject 0.8 mLs (80 Units total) into the skin daily. 60 mL 3   lisinopril (ZESTRIL) 20 MG tablet TAKE 1 Tablet BY MOUTH ONCE EVERY DAY 90 tablet 0   metFORMIN (GLUCOPHAGE-XR) 500 MG 24 hr tablet TAKE 2 Tablets BY MOUTH ONCE EVERY DAY WITH BREAKFAST 180 tablet 0   metoCLOPramide (REGLAN) 10 MG tablet Take 1 tablet (10 mg total) by mouth every 8 (eight) hours as needed for nausea. 30 tablet 0   metoprolol tartrate (LOPRESSOR) 100 MG tablet TAKE 1 Tablet  BY MOUTH TWICE DAILY 180 tablet 0   nystatin cream (MYCOSTATIN) Apply 1 application. topically 2 (two) times daily. To penile irritation. 30 g 3   oxyCODONE-acetaminophen (PERCOCET) 10-325 MG tablet Take 1 tablet by mouth every 4 (four) hours as needed for pain. 170 tablet 0   pantoprazole (PROTONIX) 40 MG tablet Take 1 tablet (40 mg total) by mouth daily. 30 tablet 0   sildenafil (VIAGRA) 100 MG tablet Take 1 tablet (100 mg total) by mouth daily as needed for erectile dysfunction. 10 tablet 11   No current facility-administered medications for this visit.     Objective: Vital signs in last 24 hours: BP 121/80   Pulse 92   Intake/Output from previous day: No intake/output data recorded. Intake/Output this shift: '@IOTHISSHIFT'$ @   Physical Exam Vitals reviewed.  Constitutional:      Appearance: Normal appearance. He is obese.  Genitourinary:    Comments: His penis is no longer buried and he has no phimosis.  Scrotum, testes and epididymis normal.  Neurological:     Mental Status: He is alert.     Lab Results:  Results for orders placed or performed in visit on 09/09/22 (from the past 24 hour(s))  Urinalysis, Routine w reflex microscopic     Status: Abnormal   Collection Time: 09/09/22  3:29 PM  Result Value Ref Range   Specific Gravity, UA 1.020 1.005 - 1.030   pH, UA 5.0 5.0 - 7.5   Color, UA  Amber (A) Yellow   Appearance Ur Clear Clear   Leukocytes,UA Trace (A) Negative   Protein,UA 1+ (A) Negative/Trace   Glucose, UA Negative Negative   Ketones, UA 1+ (  A) Negative   RBC, UA Negative Negative   Bilirubin, UA Negative Negative   Urobilinogen, Ur 1.0 0.2 - 1.0 mg/dL   Nitrite, UA Negative Negative   Microscopic Examination See below:    Narrative   Performed at:  [facility] [address], [place], [place]  [id number] Lab Director: Mina Marble MT, Phone:  [phone]  Microscopic Examination     Status: Abnormal   Collection Time: 09/09/22  3:29 PM   Urine  Result Value Ref Range   WBC, UA 0-5 0 - 5 /hpf   RBC, Urine 0-2 0 - 2 /hpf   Epithelial Cells (non renal) 0-10 0 - 10 /hpf   Casts Present (A) None seen /lpf   Cast Type Hyaline casts N/A   Bacteria, UA None seen None seen/Few   Narrative   Performed at:  [facility] [address], [place], [place]  [id number] Lab Director: [place], Phone:  [phone]       BMET No results for input(s): "NA", "K", "CL", "CO2", "GLUCOSE", "BUN", "CREATININE", "CALCIUM" in the last 72 hours. PT/INR No results for input(s): "LABPROT", "INR" in the last 72 hours. ABG No results for input(s): "PHART", "HCO3" in the last 72 hours.  Invalid input(s): "PCO2", "PO2"  Studies/Results: No results found.   Assessment/Plan: He no longer has a  buried penis or  phimosis.  The balanitis has resolved.  He will not need to go to [facility] to see Dr. Francesca Jewett.      Hypogonadism.   I have reordered the testosterone panel which doesn't appear to have been done.   ED.   He would like to try sildenafil again.  He has used it in the past.  LUTS.  His voiding symptoms are stable.  His UA is unremarkable today and has no glucose.   Meds ordered this encounter  Medications   sildenafil (VIAGRA) 100 MG tablet    Sig: Take 1 tablet (100 mg total) by mouth daily as needed for erectile  dysfunction.    Dispense:  10 tablet    Refill:  11     Orders Placed This Encounter  Procedures   Microscopic Examination   Urinalysis, Routine w reflex microscopic   Testosterone Free, Profile I    Morning fasting blood draw.    Standing Status:   Future    Standing Expiration Date:   09/10/2023     Return in about 3 months (around 12/09/2022).    CC: [name] PA.      Irine Seal 09/10/2022 [phone]

## 2022-09-10 LAB — URINALYSIS, ROUTINE W REFLEX MICROSCOPIC
Bilirubin, UA: NEGATIVE
Glucose, UA: NEGATIVE
Nitrite, UA: NEGATIVE
RBC, UA: NEGATIVE
Specific Gravity, UA: 1.02 (ref 1.005–1.030)
Urobilinogen, Ur: 1 mg/dL (ref 0.2–1.0)
pH, UA: 5 (ref 5.0–7.5)

## 2022-09-10 LAB — MICROSCOPIC EXAMINATION: Bacteria, UA: NONE SEEN

## 2022-09-14 ENCOUNTER — Telehealth: Payer: Self-pay

## 2022-09-14 MED ORDER — OXYCODONE-ACETAMINOPHEN 10-325 MG PO TABS: 1.0000 | ORAL_TABLET | ORAL | 0 refills | Status: DC | PRN

## 2022-09-14 NOTE — Telephone Encounter (Signed)
SENT TO PROVIDER

## 2022-09-14 NOTE — Telephone Encounter (Signed)
Oxycodone-Acetaminophen 10/325 MG Qty 170 Tablets  PATIENT USES [facility]

## 2022-09-15 ENCOUNTER — Telehealth: Payer: Self-pay

## 2022-09-15 NOTE — Telephone Encounter (Signed)
Called to follow up with client , had recent ER visit. He states it ended up being indigestion and reflux. He reports that is doing better, discussed upcoming appointments. He has two scheduled on 10/12/22 orthopedics and endocrine, he states he will call and change one of them. He reports his blood sugars are improving since on Trulicity. This am he reports blood sugar of 161. Will await next A1C to see if changes are noted. He states he is anxious to see. He reports feeling less tired.  He reports he has applied for Medicaid, but has not heard anything back yet. Reviewed [employer] today and has not been approved. Will continue to follow.  [place] [employer]

## 2022-09-16 ENCOUNTER — Other Ambulatory Visit: Payer: Self-pay

## 2022-09-17 ENCOUNTER — Ambulatory Visit (INDEPENDENT_AMBULATORY_CARE_PROVIDER_SITE_OTHER): Payer: No Typology Code available for payment source | Admitting: Podiatry

## 2022-09-17 DIAGNOSIS — B351 Tinea unguium: Secondary | ICD-10-CM

## 2022-09-17 DIAGNOSIS — M79674 Pain in right toe(s): Secondary | ICD-10-CM

## 2022-09-17 DIAGNOSIS — M79675 Pain in left toe(s): Secondary | ICD-10-CM

## 2022-09-17 NOTE — Progress Notes (Signed)
   Chief Complaint  Patient presents with   Diabetes    Diabetic foot care, A1c-12, BG- not taking, callus and nail trim     SUBJECTIVE Patient with a history of diabetes mellitus presents to office today complaining of elongated, thickened nails that cause pain while ambulating in shoes.  Patient is unable to trim their own nails. Patient is here for further evaluation and treatment.  Past Medical History:  Diagnosis Date   Arthritis    Diabetes mellitus without complication ([place])    diagnosed about age 56   Gout    Hypertension    boarderline    No Known Allergies   OBJECTIVE General Patient is awake, alert, and oriented x 3 and in no acute distress. Derm Skin is dry and supple bilateral. Negative open lesions or macerations. Remaining integument unremarkable. Nails are tender, long, thickened and dystrophic with subungual debris, consistent with onychomycosis, 1-5 bilateral. No signs of infection noted. Vasc  DP and PT pedal pulses palpable bilaterally. Temperature gradient within normal limits.  Neuro Epicritic and protective threshold sensation diminished bilaterally.  Musculoskeletal Exam No symptomatic pedal deformities noted bilateral. Muscular strength within normal limits.  ASSESSMENT 1. Diabetes Mellitus w/ peripheral neuropathy 2.  Pain due to onychomycosis of toenails bilateral 3.  Diffuse hyperkeratosis of skin  PLAN OF CARE 1. Patient evaluated today. 2. Instructed to maintain good pedal hygiene and foot care. Stressed importance of controlling blood sugar.  3. Mechanical debridement of nails 1-5 bilaterally performed using a nail nipper. Filed with dremel without incident.  4.  Recommend OTC foot miracle lotion daily 5.  Return to clinic 6 weeks    Edrick Kins, DPM [facility]  Dr. Edrick Kins, DPM    [address], [place] [address]                Office [phone]  Fax [phone]

## 2022-09-20 ENCOUNTER — Other Ambulatory Visit: Payer: Self-pay

## 2022-09-20 DIAGNOSIS — N5201 Erectile dysfunction due to arterial insufficiency: Secondary | ICD-10-CM

## 2022-09-21 ENCOUNTER — Other Ambulatory Visit (HOSPITAL_COMMUNITY)
Admission: RE | Admit: 2022-09-21 | Discharge: 2022-09-21 | Disposition: A | Discharge status: Home / Self Care | Payer: Self-pay | Source: Ambulatory Visit | Attending: Urology | Admitting: Urology

## 2022-09-21 DIAGNOSIS — N5201 Erectile dysfunction due to arterial insufficiency: Secondary | ICD-10-CM | POA: Insufficient documentation

## 2022-09-27 ENCOUNTER — Telehealth: Payer: Self-pay

## 2022-09-27 NOTE — Telephone Encounter (Signed)
Returned missed call from client. He states his meter has stopped working, but he cannot talk right now as he is at [place] with his son. He will call RN back after visit.   [place] [employer]

## 2022-09-28 LAB — TESTOSTERONE,FREE AND TOTAL
Testosterone, Free: 4.4 pg/mL — ABNORMAL LOW (ref 7.2–24.0)
Testosterone: 140 ng/dL — ABNORMAL LOW (ref 264–916)

## 2022-10-06 ENCOUNTER — Other Ambulatory Visit: Payer: Self-pay | Admitting: Physician Assistant

## 2022-10-06 DIAGNOSIS — I10 Essential (primary) hypertension: Secondary | ICD-10-CM

## 2022-10-06 DIAGNOSIS — R7989 Other specified abnormal findings of blood chemistry: Secondary | ICD-10-CM

## 2022-10-06 DIAGNOSIS — E785 Hyperlipidemia, unspecified: Secondary | ICD-10-CM

## 2022-10-06 DIAGNOSIS — E1165 Type 2 diabetes mellitus with hyperglycemia: Secondary | ICD-10-CM

## 2022-10-11 ENCOUNTER — Telehealth: Payer: Self-pay

## 2022-10-11 NOTE — Telephone Encounter (Signed)
Called client to follow up as he now has Medicaid through recent enrollment and expansion. Client is aware. He has not received his ID cards yet, but he does have his ID number. We discussed that he can no longer go to [facility] and would need to notify them and would not be able to go to his next appointment.  We discussed that his other providers such as endocrine, ortho etc should be able to continue care. Recommended he call all his current specialist to notify them.  Recommended he establish with a new primary care provider as soon as possible as to not lapse in his care. He states he wants to go back to Dr. Berdine Addison. I encouraged him to call to make sure they accept his plan and new Medicaid patients. He states he will do so. He reports his blood sugars are improved with the addition of Trulicity. He states he is up to date on all his medications and has them. Encouraged him to talk with his endocrinologist to see if he would not qualify for the continuous glucose monitoring with having Medicaid now.  He understands he is no longer enrolled in [employer], but we are still here to help him navigate with being new to Medicaid or any other resources he may need. He states understanding.  Plan; will plan to follow up next Monday to determine if he has contacted his new primary care provider and has been accepted. He is agreeable.  [name] [employer]

## 2022-10-12 ENCOUNTER — Encounter: Payer: Self-pay | Admitting: Orthopaedic Surgery

## 2022-10-12 ENCOUNTER — Ambulatory Visit: Payer: Self-pay | Admitting: Nurse Practitioner

## 2022-10-12 ENCOUNTER — Ambulatory Visit (INDEPENDENT_AMBULATORY_CARE_PROVIDER_SITE_OTHER): Payer: Self-pay | Admitting: Orthopaedic Surgery

## 2022-10-12 VITALS — BP 183/111 | HR 90 | Ht 68.0 in | Wt 325.0 lb

## 2022-10-12 DIAGNOSIS — M25562 Pain in left knee: Secondary | ICD-10-CM

## 2022-10-12 DIAGNOSIS — G8929 Other chronic pain: Secondary | ICD-10-CM

## 2022-10-12 DIAGNOSIS — M1A09X0 Idiopathic chronic gout, multiple sites, without tophus (tophi): Secondary | ICD-10-CM

## 2022-10-12 DIAGNOSIS — Z6841 Body Mass Index (BMI) 40.0 and over, adult: Secondary | ICD-10-CM

## 2022-10-12 DIAGNOSIS — M25561 Pain in right knee: Secondary | ICD-10-CM

## 2022-10-12 MED ORDER — OXYCODONE-ACETAMINOPHEN 10-325 MG PO TABS: 1.0000 | ORAL_TABLET | ORAL | 0 refills | Status: DC | PRN

## 2022-10-12 NOTE — Progress Notes (Signed)
My knees hurt.  He continues to have knee pain bilaterally.  He has no new trauma.  He has no weakness or numbness.  He is losing some weight.   Both knees have effusion, crepitus, right ROM 0 to 100, left 0 to 105, limp right.  NV intact.  Encounter Diagnoses  Name Primary?   Chronic pain of right knee Yes   Chronic pain of left knee    Morbid obesity due to excess calories (H[facility])    Body mass index 60.0-69.9, adult (HCC)    Idiopathic chronic gout of multiple sites without tophus    I have reviewed the [place] web site prior to prescribing narcotic medicine for this patient.  Return in three months.  Keep losing weight.  Call if any problem.  Precautions discussed.  Electronically Signed Sanjuana Kava, MD 2/6/20243:04 PM

## 2022-10-13 ENCOUNTER — Telehealth: Payer: Self-pay | Admitting: Orthopaedic Surgery

## 2022-10-13 NOTE — Telephone Encounter (Signed)
Spoke with pharmacy. Due to insurance, they will only cover a 5 day supply. They went ahead and filled it for the 5 day supply so I will have time to try and get a prior auth for the medication. Patient is aware that due to insurance, he is only getting a 5 day supply and he will have to send in a request for a new prescription in a week.

## 2022-10-13 NOTE — Telephone Encounter (Signed)
Patient called, stated that [facility] is stating he needs a prior autho for the Oxycodone.  Pt's # [phone]

## 2022-10-14 ENCOUNTER — Ambulatory Visit (INDEPENDENT_AMBULATORY_CARE_PROVIDER_SITE_OTHER): Payer: Medicaid Other | Admitting: Nurse Practitioner

## 2022-10-14 ENCOUNTER — Encounter: Payer: Self-pay | Admitting: Nurse Practitioner

## 2022-10-14 VITALS — BP 149/81 | HR 82 | Ht 68.0 in | Wt 328.0 lb

## 2022-10-14 DIAGNOSIS — I10 Essential (primary) hypertension: Secondary | ICD-10-CM | POA: Diagnosis not present

## 2022-10-14 DIAGNOSIS — E782 Mixed hyperlipidemia: Secondary | ICD-10-CM | POA: Diagnosis not present

## 2022-10-14 DIAGNOSIS — E1165 Type 2 diabetes mellitus with hyperglycemia: Secondary | ICD-10-CM | POA: Diagnosis not present

## 2022-10-14 DIAGNOSIS — Z91199 Patient's noncompliance with other medical treatment and regimen due to unspecified reason: Secondary | ICD-10-CM | POA: Diagnosis not present

## 2022-10-14 LAB — POCT GLYCOSYLATED HEMOGLOBIN (HGB A1C): Hemoglobin A1C: 9.1 % — AB (ref 4.0–5.6)

## 2022-10-14 MED ORDER — NOVOLOG FLEXPEN 100 UNIT/ML ~~LOC~~ SOPN: 16.0000 [IU] | PEN_INJECTOR | Freq: Three times a day (TID) | SUBCUTANEOUS | 3 refills | Status: DC

## 2022-10-14 MED ORDER — INSULIN GLARGINE 100 UNIT/ML ~~LOC~~ SOLN: 80.0000 [IU] | Freq: Every day | SUBCUTANEOUS | 3 refills | Status: DC

## 2022-10-14 MED ORDER — TRULICITY 3 MG/0.5ML ~~LOC~~ SOAJ: 3.0000 mg | SUBCUTANEOUS | 1 refills | Status: DC

## 2022-10-14 NOTE — Progress Notes (Signed)
10/14/2022, 3:23 PM      Endocrinology Follow Up Visit  Subjective:    Patient ID: [name], male    DOB: 22-May-1966.  [name] is being seen in follow up for management of currently uncontrolled symptomatic diabetes requested by  [name], PA-C.   Past Medical History:  Diagnosis Date   Arthritis    Diabetes mellitus without complication ([place])    diagnosed about age 57   Gout    Hypertension    boarderline    Past Surgical History:  Procedure Laterality Date   HERNIA REPAIR  2008   MASS EXCISION Left age 22   L thigh- benign    Social History   Socioeconomic History   Marital status: Single    Spouse name: Not on file   Number of children: Not on file   Years of education: Not on file   Highest education level: Not on file  Occupational History   Not on file  Tobacco Use   Smoking status: Former    Packs/day: 0.50    Years: 5.00    Total pack years: 2.50    Types: Cigarettes    Quit date: 52    Years since quitting: 34.1   Smokeless tobacco: Never  Vaping Use   Vaping Use: Never used  Substance and Sexual Activity   Alcohol use: No   Drug use: No   Sexual activity: Not on file  Other Topics Concern   Not on file  Social History Narrative   Not on file   Social Determinants of Health   Financial Resource Strain: Not on file  Food Insecurity: Not on file  Transportation Needs: Not on file  Physical Activity: Not on file  Stress: Not on file  Social Connections: Not on file    Family History  Problem Relation Age of Onset   Cancer Mother    Diabetes Mother    Heart failure Father    Hypertension Father    Diabetes Father    Heart attack Father     Outpatient Encounter Medications as of 10/14/2022  Medication Sig   allopurinol (ZYLOPRIM) 300 MG tablet Take 1 tablet (300 mg total) by mouth daily. (Patient taking differently: Take 300 mg by mouth daily as  needed.)   amLODipine (NORVASC) 10 MG tablet TAKE 1 Tablet BY MOUTH ONCE EVERY DAY   atorvastatin (LIPITOR) 80 MG tablet TAKE 1 Tablet BY MOUTH ONCE EVERY DAY   cyclobenzaprine (FLEXERIL) 10 MG tablet Take 1 tablet (10 mg total) by mouth 3 (three) times daily as needed for muscle spasms.   Dulaglutide (TRULICITY) 3 LJ/4.4BE SOPN Inject 3 mg as directed once a week.   furosemide (LASIX) 20 MG tablet Take 1 tablet (20 mg total) by mouth daily.   glipiZIDE (GLUCOTROL XL) 5 MG 24 hr tablet Take 1 tablet (5 mg total) by mouth daily with breakfast.   lisinopril (ZESTRIL) 20 MG tablet TAKE 1 Tablet BY MOUTH ONCE EVERY DAY   metFORMIN (GLUCOPHAGE-XR) 500 MG 24 hr tablet TAKE 2 Tablets BY MOUTH ONCE EVERY DAY WITH BREAKFAST   metoCLOPramide (REGLAN) 10 MG tablet Take 1 tablet (10 mg total) by mouth every 8 (  eight) hours as needed for nausea.   metoprolol tartrate (LOPRESSOR) 100 MG tablet TAKE 1 Tablet  BY MOUTH TWICE DAILY   nystatin cream (MYCOSTATIN) Apply 1 application. topically 2 (two) times daily. To penile irritation.   oxyCODONE-acetaminophen (PERCOCET) 10-325 MG tablet Take 1 tablet by mouth every 4 (four) hours as needed for pain.   sildenafil (VIAGRA) 100 MG tablet Take 1 tablet (100 mg total) by mouth daily as needed for erectile dysfunction.   [DISCONTINUED] Dulaglutide (TRULICITY) 1.5 BJ/4.7WG SOPN Inject 1.5 mg into the skin once a week.   [DISCONTINUED] insulin aspart (NOVOLOG FLEXPEN) 100 UNIT/ML FlexPen Inject 16-22 Units into the skin 3 (three) times daily with meals.   [DISCONTINUED] insulin glargine (LANTUS) 100 UNIT/ML injection Inject 0.8 mLs (80 Units total) into the skin daily.   insulin aspart (NOVOLOG FLEXPEN) 100 UNIT/ML FlexPen Inject 16-22 Units into the skin 3 (three) times daily with meals.   insulin glargine (LANTUS) 100 UNIT/ML injection Inject 0.8 mLs (80 Units total) into the skin daily.   pantoprazole (PROTONIX) 40 MG tablet Take 1 tablet (40 mg total) by mouth daily.    [DISCONTINUED] oxyCODONE-acetaminophen (PERCOCET) 10-325 MG tablet Take 1 tablet by mouth every 4 (four) hours as needed for pain.   No facility-administered encounter medications on file as of 10/14/2022.    ALLERGIES: No Known Allergies  VACCINATION STATUS: Immunization History  Administered Date(s) Administered   Influenza,inj,Quad PF,6+ Mos 06/30/2017   Moderna Sars-Covid-2 Vaccination 04/01/2020, 04/29/2020    Diabetes He presents for his follow-up diabetic visit. He has type 2 diabetes mellitus. Onset time: He was diagnosed at approximate age of 50 years. His disease course has been improving. Hypoglycemia symptoms include sweats and tremors. Pertinent negatives for hypoglycemia include no confusion, headaches, pallor or seizures. Associated symptoms include blurred vision, fatigue, polydipsia, polyphagia, polyuria and weight loss. Pertinent negatives for diabetes include no chest pain and no weakness. There are no hypoglycemic complications. Symptoms are improving. There are no diabetic complications. Risk factors for coronary artery disease include dyslipidemia, diabetes mellitus, hypertension, male sex, obesity and sedentary lifestyle. Current diabetic treatment includes intensive insulin program and oral agent (dual therapy) (and Trulicity). He is compliant with treatment most of the time. His weight is decreasing steadily. He is following a generally unhealthy diet. When asked about meal planning, he reported none. He has not had a previous visit with a dietitian. He never participates in exercise. His home blood glucose trend is decreasing steadily. His overall blood glucose range is 140-180 mg/dl. (He presents today with his meter, no logs, showing greatly improved glycemic profile overall.  His POCT A1c today is 9.1%, improving drastically from last visit of 14.5%.  He notes he has been more consistent with taking his medications and has been losing weight as well.  Analysis of his  meter shows 7-day average of 155, 14-day average of 149, 30-day average of 168.  He did have some mild hypoglycemia noted but rarely.) An ACE inhibitor/angiotensin II receptor blocker is being taken. He does not see a podiatrist.Eye exam is current.  Hyperlipidemia This is a chronic problem. The current episode started more than 1 year ago. The problem is uncontrolled. Recent lipid tests were reviewed and are high. Exacerbating diseases include diabetes and obesity. Factors aggravating his hyperlipidemia include beta blockers and fatty foods. Pertinent negatives include no chest pain, myalgias or shortness of breath. Current antihyperlipidemic treatment includes statins. The current treatment provides moderate improvement of lipids. Compliance problems include adherence to diet,  adherence to exercise and medication cost.  Risk factors for coronary artery disease include dyslipidemia, diabetes mellitus, family history, hypertension, male sex, obesity and a sedentary lifestyle.  Hypertension This is a chronic problem. The current episode started more than 1 year ago. The problem has been resolved since onset. The problem is controlled. Associated symptoms include blurred vision and sweats. Pertinent negatives include no chest pain, headaches, malaise/fatigue, neck pain, palpitations or shortness of breath. There are no associated agents to hypertension. Risk factors for coronary artery disease include dyslipidemia, diabetes mellitus, male gender, obesity and sedentary lifestyle. Past treatments include ACE inhibitors, beta blockers, calcium channel blockers and diuretics. The current treatment provides mild improvement. Compliance problems include medication cost, diet and exercise.     Review of systems  Constitutional: + decreasing body weight,  current Body mass index is 49.87 kg/m., + fatigue, no subjective hyperthermia, no subjective hypothermia Eyes: + blurry vision, no xerophthalmia ENT: no sore  throat, no nodules palpated in throat, no dysphagia/odynophagia, no hoarseness Cardiovascular: no chest pain, no shortness of breath, no palpitations, no leg swelling Respiratory: no cough, no shortness of breath Gastrointestinal: no nausea/vomiting/diarrhea Musculoskeletal: + generalized muscle/joint aches Skin: no rashes, no hyperemia Neurological: no tremors, no numbness, no tingling, no dizziness Psychiatric: no depression, no anxiety    Objective:    BP (!) 149/81 (BP Location: Right Arm, Patient Position: Sitting, Cuff Size: Large) Comment: Retake  Pulse 82   Ht '5\' 8"'$  (1.727 m)   Wt (!) 328 lb (148.8 kg)   BMI 49.87 kg/m   Wt Readings from Last 3 Encounters:  10/14/22 (!) 328 lb (148.8 kg)  10/12/22 (!) 325 lb (147.4 kg)  08/24/22 300 lb (136.1 kg)    BP Readings from Last 3 Encounters:  10/14/22 (!) 149/81  10/12/22 (!) 183/111  09/09/22 121/80    Physical Exam- Limited  Constitutional:  Body mass index is 49.87 kg/m. , not in acute distress, normal state of mind Eyes:  EOMI, no exophthalmos Neck: Supple Cardiovascular: RRR, no murmurs, rubs, or gallops, no edema Respiratory: Adequate breathing efforts, no crackles, rales, rhonchi, or wheezing Musculoskeletal: no gross deformities, strength intact in all four extremities, no gross restriction of joint movements Skin:  no rashes, no hyperemia Neurological: no tremor with outstretched hands   CMP ( most recent) CMP     Component Value Date/Time   NA 136 08/24/2022 1610   K 4.3 08/24/2022 1610   CL 102 08/24/2022 1610   CO2 24 08/24/2022 1610   GLUCOSE 176 (H) 08/24/2022 1610   BUN 11 08/24/2022 1610   CREATININE 0.73 08/24/2022 1610   CREATININE 1.42 (H) 09/08/2017 1540   CALCIUM 9.3 08/24/2022 1610   PROT 8.7 (H) 08/24/2022 1610   ALBUMIN 3.8 08/24/2022 1610   AST 32 08/24/2022 1610   ALT 55 (H) 08/24/2022 1610   ALKPHOS 300 (H) 08/24/2022 1610   BILITOT 1.0 08/24/2022 1610   GFRNONAA >60  08/24/2022 1610   GFRAA >60 03/17/2020 1200     Diabetic Labs (most recent): Lab Results  Component Value Date   HGBA1C 9.1 (A) 10/14/2022   HGBA1C 14.5 (A) 07/12/2022   HGBA1C >15 04/12/2022   MICROALBUR 7.2 (H) 07/19/2022   MICROALBUR <3.0 (H) 07/07/2021   MICROALBUR 80 02/26/2021     Lipid Panel ( most recent) Lipid Panel     Component Value Date/Time   CHOL 263 (H) 07/19/2022 1337   TRIG 77 07/19/2022 1337   HDL 62 07/19/2022 1337  CHOLHDL 4.2 07/19/2022 1337   VLDL 15 07/19/2022 1337   LDLCALC 186 (H) 07/19/2022 1337      Lab Results  Component Value Date   TSH 0.532 06/28/2017       Assessment & Plan:   1) Uncontrolled type 2 diabetes mellitus with hyperglycemia (H[facility])  - [name] has currently uncontrolled symptomatic type 2 DM since  57 years of age.  He presents today with his meter, no logs, showing greatly improved glycemic profile overall.  His POCT A1c today is 9.1%, improving drastically from last visit of 14.5%.  He notes he has been more consistent with taking his medications and has been losing weight as well.  Analysis of his meter shows 7-day average of 155, 14-day average of 149, 30-day average of 168.  He did have some mild hypoglycemia noted but rarely.  -Recent labs reviewed.  - I had a long discussion with him about the progressive nature of diabetes and the pathology behind its complications. -his diabetes is complicated by obesity/sedentary life, inadequate insurance coverage,  And he remains at a high risk for more acute and chronic complications which include CAD, CVA, CKD, retinopathy, and neuropathy. These are all discussed in detail with him.  - Nutritional counseling repeated at each appointment due to patients tendency to fall back in to old habits.  - The patient admits there is a room for improvement in their diet and drink choices. -  Suggestion is made for the patient to avoid simple carbohydrates from their diet including  Cakes, Sweet Desserts / Pastries, Ice Cream, Soda (diet and regular), Sweet Tea, Candies, Chips, Cookies, Sweet Pastries, Store Bought Juices, Alcohol in Excess of 1-2 drinks a day, Artificial Sweeteners, Coffee Creamer, and "Sugar-free" Products. This will help patient to have stable blood glucose profile and potentially avoid unintended weight gain.   - I encouraged the patient to switch to unprocessed or minimally processed complex starch and increased protein intake (animal or plant source), fruits, and vegetables.   - Patient is advised to stick to a routine mealtimes to eat 3 meals a day and avoid unnecessary snacks (to snack only to correct hypoglycemia).  - he will be scheduled with Jearld Fenton, RDN, CDE for diabetes education.  - I have approached him with the following individualized plan to manage  his diabetes and patient agrees:   -He is advised to continue his Lantus 80 units SQ nightly, Novolog 16-22 units TID with meals if glucose is above 90 and he is eating (Specific instructions on how to titrate insulin dosage based on glucose readings given to patient in writing), Metformin 1000 mg ER PO daily with breakfast, and Glipizide 5 mg XL daily with breakfast.  Will increase his Trulicity to 3 mg SQ weekly in hopes to decrease his insulin burden in the future.   He is encouraged consistently monitor blood glucose 4 times per day, before meals and at bedtime and report to the clinic if readings are less than 70 or greater than 300 for 3 tests in a row.  - Specific targets for  A1c;  LDL, HDL,  and Triglycerides were discussed with the patient.  2) Blood Pressure /Hypertension:   His blood pressure is not controlled to target.  He is advised to continue Norvasc 5 mg po daily, Lasix 20 mg po daily, Lisinopril 20 mg po daily, and Metoprolol 100 mg po twice daily.    3) Lipids/Hyperlipidemia:   His recent lipid panel from 07/19/22 shows  uncontrolled LDL of 186.  He is advised to  continue Atorvastatin 40 mg po daily at bedtime.  Side effects and precautions discussed with him.    4)  Weight/Diet:  His Body mass index is 49.87 kg/m.-   clearly complicating his diabetes care.   he is  a candidate for modest weight loss. I discussed with him the fact that loss of 5 - 10% of his  current body weight will have the most impact on his diabetes management.  Exercise, and detailed carbohydrates information provided  -  detailed on discharge instructions.  He is a good candidate for bariatric surgery, however at this point he has no insurance.  5) Chronic Care/Health Maintenance: -he is on ACEI/ARB and Statin medications and is encouraged to initiate and continue to follow up with Ophthalmology, Dentist,  Podiatrist at least yearly or according to recommendations, and advised to stay away from smoking. I have recommended yearly flu vaccine and pneumonia vaccine at least every 5 years; moderate intensity exercise for up to 150 minutes weekly; and  sleep for at least 7 hours a day.  - he is advised to maintain close follow up with [name], PA-C for primary care needs, as well as his other providers for optimal and coordinated care.     I spent  42  minutes in the care of the patient today including review of labs from [facility], Lipids, Thyroid Function, Hematology (current and previous including abstractions from other facilities); face-to-face time discussing  his blood glucose readings/logs, discussing hypoglycemia and hyperglycemia episodes and symptoms, medications doses, his options of short and long term treatment based on the latest standards of care / guidelines;  discussion about incorporating lifestyle medicine;  and documenting the encounter. Risk reduction counseling performed per USPSTF guidelines to reduce obesity and cardiovascular risk factors.     Please refer to Patient Instructions for Blood Glucose Monitoring and Insulin/Medications Dosing Guide"  in media tab for  additional information. Please  also refer to " Patient Self Inventory" in the Media  tab for reviewed elements of pertinent patient history.  [name] participated in the discussions, expressed understanding, and voiced agreement with the above plans.  All questions were answered to his satisfaction. he is encouraged to contact clinic should he have any questions or concerns prior to his return visit.   Follow up plan: - Return in about 3 months (around 01/12/2023) for Diabetes F/U with A1c in office, No previsit labs, Bring meter and logs.  Rayetta Pigg, [facility] [address] [place], [place] [address] Phone: [phone] Fax: [phone]  10/14/2022, 3:23 PM

## 2022-10-18 ENCOUNTER — Telehealth: Payer: Self-pay

## 2022-10-18 NOTE — Telephone Encounter (Signed)
Opened in ERROR

## 2022-10-18 NOTE — Telephone Encounter (Signed)
Called as scheduled to follow up with client who is transitioning to Medicaid. He states he has contacted Dr. Berdine Addison and has an upcoming appointment on 10/25/22 to establish care. He had a recent visit with endocrine and his A1C on 10/14/22 was down from 14.5 to 9.1. client aware he still has room to improve. Congratulated client on the work he has done and lowered A1C down, encouraged to continue to follow a low carb nutritious diet and to add exercise in as he can and to follow up with all medical appointments and take medications as prescribed. He is very thankful for all our help and support. Will call back to do a patient survey 10/18/22 at [facility].  Encouraged client that we are always here for resources and to answer any questions.  [place] [employer]

## 2022-10-19 ENCOUNTER — Telehealth: Payer: Self-pay | Admitting: Orthopaedic Surgery

## 2022-10-19 ENCOUNTER — Telehealth: Payer: Self-pay

## 2022-10-19 MED ORDER — OXYCODONE-ACETAMINOPHEN 10-325 MG PO TABS: 1.0000 | ORAL_TABLET | ORAL | 0 refills | Status: DC | PRN

## 2022-10-19 NOTE — Telephone Encounter (Signed)
Spoke with patient.He has recently been approved for Medicaid. Medicaid will not approve more than 5-7 days worth of pain medication usually. Sent refill request to provider.

## 2022-10-19 NOTE — Telephone Encounter (Signed)
Patient [name] requesting a refill on his Oxycodone 10-325 to be sent to [facility]

## 2022-10-19 NOTE — Telephone Encounter (Signed)
Oxycodone-Acetaminophen 10/325 MG   PATIENT USES [facility]   Patient states that he only got a week supply because pharmacy needs a PA.

## 2022-10-20 ENCOUNTER — Ambulatory Visit: Payer: Self-pay | Admitting: Physician Assistant

## 2022-10-21 NOTE — Telephone Encounter (Signed)
Provider asked to place an order.

## 2022-10-22 ENCOUNTER — Telehealth: Payer: Self-pay

## 2022-10-22 NOTE — Telephone Encounter (Signed)
Called to reschedule patient input survey for 10/25/22 at 10 am.  Client is agreeable. He has his new provider appointment 10/25/22 afternoon also with Dr. Darvin Neighbours.    Will call 10/25/22  [name] RN [place]

## 2022-10-26 ENCOUNTER — Telehealth: Payer: Self-pay

## 2022-10-26 ENCOUNTER — Telehealth: Payer: Self-pay | Admitting: Orthopaedic Surgery

## 2022-10-26 MED ORDER — OXYCODONE-ACETAMINOPHEN 10-325 MG PO TABS: 1.0000 | ORAL_TABLET | ORAL | 0 refills | Status: DC | PRN

## 2022-10-26 NOTE — Telephone Encounter (Signed)
Called to follow up with [facility] client who recently transitioned to [facility]. Client states his first appointment to establish care with Dr. Berdine Addison went well, no changes to plan of care at this time. He will continue with Endocrine and Orthopedics.  Client answered [facility] patient survey.  Client aware he may still use the [employer] as well as call this RN anytime he may have questions or needs for resources or to update on his status.  Client expressed gratefulness to [facility] as well as [facility] staff for their care, compassion and concern.   [place] [employer]

## 2022-10-26 NOTE — Telephone Encounter (Signed)
Patient called requesting a refill for Oxycodone 10-325 to be sent to [facility].  Pt's # [phone]

## 2022-10-27 NOTE — Telephone Encounter (Signed)
done 

## 2022-10-29 ENCOUNTER — Ambulatory Visit (INDEPENDENT_AMBULATORY_CARE_PROVIDER_SITE_OTHER): Payer: Medicaid Other | Admitting: Podiatry

## 2022-10-29 DIAGNOSIS — L0889 Other specified local infections of the skin and subcutaneous tissue: Secondary | ICD-10-CM | POA: Diagnosis not present

## 2022-10-29 DIAGNOSIS — L989 Disorder of the skin and subcutaneous tissue, unspecified: Secondary | ICD-10-CM | POA: Diagnosis not present

## 2022-10-29 MED ORDER — GABAPENTIN 100 MG PO CAPS: 100.0000 mg | ORAL_CAPSULE | Freq: Three times a day (TID) | ORAL | 3 refills | Status: DC

## 2022-10-29 NOTE — Progress Notes (Signed)
   Chief Complaint  Patient presents with   Diabetes    Diabetic A1c- 8.7 bg- 120, nail trim, numbness, burning     SUBJECTIVE Patient with a history of diabetes mellitus presents to office today for follow-up evaluation of symptomatic calluses to the bilateral heels.  They have been applying lotion daily to the area. Past Medical History:  Diagnosis Date   Arthritis    Diabetes mellitus without complication ([place])    diagnosed about age 56   Gout    Hypertension    boarderline    No Known Allergies   OBJECTIVE General Patient is awake, alert, and oriented x 3 and in no acute distress. Derm Skin is dry and supple bilateral. Negative open lesions or macerations. Remaining integument unremarkable. Nails are tender, long, thickened and dystrophic with subungual debris, consistent with onychomycosis, 1-5 bilateral. No signs of infection noted. Vasc  DP and PT pedal pulses palpable bilaterally. Temperature gradient within normal limits.  Neuro Epicritic and protective threshold sensation diminished bilaterally.  Musculoskeletal Exam No symptomatic pedal deformities noted bilateral. Muscular strength within normal limits.  ASSESSMENT 1. Diabetes Mellitus w/ peripheral neuropathy 2.  Pain due to onychomycosis of toenails bilateral 3.  Diffuse hyperkeratosis of skin  PLAN OF CARE 1. Patient evaluated today. 2. Instructed to maintain good pedal hygiene and foot care. Stressed importance of controlling blood sugar.  3.  Excisional debridement of additional hyperkeratotic diffuse skin to the bilateral heels was performed today with a 312 scalpel without incident or bleeding 4.  Prescription for gabapentin 100 mg 3 times daily  5.  Return to clinic 3 months routine footcare   Edrick Kins, DPM [facility]  Dr. Edrick Kins, DPM    [address], [place] [address]                Office [phone]  Fax [phone]

## 2022-11-02 ENCOUNTER — Encounter: Payer: Self-pay | Admitting: Student

## 2022-11-02 ENCOUNTER — Telehealth: Payer: Self-pay | Admitting: Orthopaedic Surgery

## 2022-11-02 MED ORDER — OXYCODONE-ACETAMINOPHEN 10-325 MG PO TABS: 1.0000 | ORAL_TABLET | ORAL | 0 refills | Status: DC | PRN

## 2022-11-02 NOTE — Telephone Encounter (Signed)
Patient lvm requesting a refill on Oxycodone 10-325 to be sent to [facility].  Pt's # [phone]

## 2022-11-09 ENCOUNTER — Telehealth: Payer: Self-pay | Admitting: Orthopaedic Surgery

## 2022-11-09 MED ORDER — OXYCODONE-ACETAMINOPHEN 10-325 MG PO TABS: 1.0000 | ORAL_TABLET | ORAL | 0 refills | Status: DC | PRN

## 2022-11-09 NOTE — Telephone Encounter (Signed)
Patient lvm requesting a refill on his Oxycodone 10-325 to be sent to [facility].

## 2022-11-10 ENCOUNTER — Emergency Department (HOSPITAL_COMMUNITY)
Admission: EM | Admit: 2022-11-10 | Discharge: 2022-11-10 | Disposition: A | Discharge status: Home / Self Care | Payer: Medicaid Other | Attending: Emergency Medicine | Admitting: Emergency Medicine

## 2022-11-10 ENCOUNTER — Other Ambulatory Visit: Payer: Self-pay

## 2022-11-10 ENCOUNTER — Encounter (HOSPITAL_COMMUNITY): Payer: Self-pay | Admitting: *Deleted

## 2022-11-10 ENCOUNTER — Emergency Department (HOSPITAL_COMMUNITY): Payer: Medicaid Other

## 2022-11-10 DIAGNOSIS — R079 Chest pain, unspecified: Secondary | ICD-10-CM | POA: Diagnosis not present

## 2022-11-10 DIAGNOSIS — Z794 Long term (current) use of insulin: Secondary | ICD-10-CM | POA: Insufficient documentation

## 2022-11-10 DIAGNOSIS — S39012A Strain of muscle, fascia and tendon of lower back, initial encounter: Secondary | ICD-10-CM | POA: Diagnosis not present

## 2022-11-10 DIAGNOSIS — S3992XA Unspecified injury of lower back, initial encounter: Secondary | ICD-10-CM | POA: Diagnosis present

## 2022-11-10 DIAGNOSIS — X58XXXA Exposure to other specified factors, initial encounter: Secondary | ICD-10-CM | POA: Diagnosis not present

## 2022-11-10 DIAGNOSIS — Z79899 Other long term (current) drug therapy: Secondary | ICD-10-CM | POA: Insufficient documentation

## 2022-11-10 DIAGNOSIS — I10 Essential (primary) hypertension: Secondary | ICD-10-CM | POA: Diagnosis not present

## 2022-11-10 DIAGNOSIS — K469 Unspecified abdominal hernia without obstruction or gangrene: Secondary | ICD-10-CM | POA: Diagnosis not present

## 2022-11-10 LAB — COMPREHENSIVE METABOLIC PANEL
ALT: 16 U/L (ref 0–44)
AST: 14 U/L — ABNORMAL LOW (ref 15–41)
Albumin: 3.3 g/dL — ABNORMAL LOW (ref 3.5–5.0)
Alkaline Phosphatase: 102 U/L (ref 38–126)
Anion gap: 6 (ref 5–15)
BUN: 9 mg/dL (ref 6–20)
CO2: 29 mmol/L (ref 22–32)
Calcium: 8.6 mg/dL — ABNORMAL LOW (ref 8.9–10.3)
Chloride: 100 mmol/L (ref 98–111)
Creatinine, Ser: 0.74 mg/dL (ref 0.61–1.24)
GFR, Estimated: >60 mL/min (ref >60)
Glucose, Bld: 134 mg/dL — ABNORMAL HIGH (ref 70–99)
Potassium: 4 mmol/L (ref 3.5–5.1)
Sodium: 135 mmol/L (ref 135–145)
Total Bilirubin: 0.1 mg/dL — ABNORMAL LOW (ref 0.3–1.2)
Total Protein: 7.6 g/dL (ref 6.5–8.1)

## 2022-11-10 LAB — I-STAT CHEM 8, ED
BUN: 8 mg/dL (ref 6–20)
Calcium, Ion: 1.2 mmol/L (ref 1.15–1.40)
Chloride: 99 mmol/L (ref 98–111)
Creatinine, Ser: 0.7 mg/dL (ref 0.61–1.24)
Glucose, Bld: 131 mg/dL — ABNORMAL HIGH (ref 70–99)
HCT: 47 % (ref 39.0–52.0)
Hemoglobin: 16 g/dL (ref 13.0–17.0)
Potassium: 4.2 mmol/L (ref 3.5–5.1)
Sodium: 137 mmol/L (ref 135–145)
TCO2: 31 mmol/L (ref 22–32)

## 2022-11-10 LAB — CBC WITH DIFFERENTIAL/PLATELET
Abs Immature Granulocytes: 0.04 10*3/uL (ref 0.00–0.07)
Basophils Absolute: 0.1 10*3/uL (ref 0.0–0.1)
Basophils Relative: 1 %
Eosinophils Absolute: 0.1 10*3/uL (ref 0.0–0.5)
Eosinophils Relative: 1 %
HCT: 45.7 % (ref 39.0–52.0)
Hemoglobin: 13.9 g/dL (ref 13.0–17.0)
Immature Granulocytes: 0 %
Lymphocytes Relative: 23 %
Lymphs Abs: 2.6 10*3/uL (ref 0.7–4.0)
MCH: 26.7 pg (ref 26.0–34.0)
MCHC: 30.4 g/dL (ref 30.0–36.0)
MCV: 87.7 fL (ref 80.0–100.0)
Monocytes Absolute: 0.9 10*3/uL (ref 0.1–1.0)
Monocytes Relative: 8 %
Neutro Abs: 7.7 10*3/uL (ref 1.7–7.7)
Neutrophils Relative %: 67 %
Platelets: 328 10*3/uL (ref 150–400)
RBC: 5.21 MIL/uL (ref 4.22–5.81)
RDW: 14.8 % (ref 11.5–15.5)
WBC: 11.4 10*3/uL — ABNORMAL HIGH (ref 4.0–10.5)
nRBC: 0 % (ref 0.0–0.2)

## 2022-11-10 LAB — URINALYSIS, ROUTINE W REFLEX MICROSCOPIC
Bilirubin Urine: NEGATIVE
Glucose, UA: NEGATIVE mg/dL
Hgb urine dipstick: NEGATIVE
Ketones, ur: NEGATIVE mg/dL
Leukocytes,Ua: NEGATIVE
Nitrite: NEGATIVE
Protein, ur: NEGATIVE mg/dL
Specific Gravity, Urine: 1.019 (ref 1.005–1.030)
pH: 6 (ref 5.0–8.0)

## 2022-11-10 LAB — TYPE AND SCREEN
ABO/RH(D): O POS
Antibody Screen: NEGATIVE

## 2022-11-10 MED ORDER — SODIUM CHLORIDE 0.9 % IV BOLUS
500.0000 mL | Freq: Once | INTRAVENOUS | Status: AC
  Administered 2022-11-10: 500 mL via INTRAVENOUS

## 2022-11-10 MED ORDER — NAPROXEN 500 MG PO TABS: 500.0000 mg | ORAL_TABLET | Freq: Two times a day (BID) | ORAL | 0 refills | Status: DC

## 2022-11-10 MED ORDER — IOHEXOL 350 MG/ML SOLN
100.0000 mL | Freq: Once | INTRAVENOUS | Status: AC | PRN
  Administered 2022-11-10: 100 mL via INTRAVENOUS

## 2022-11-10 MED ORDER — LABETALOL HCL 5 MG/ML IV SOLN
20.0000 mg | Freq: Once | INTRAVENOUS | Status: AC
  Administered 2022-11-10: 20 mg via INTRAVENOUS
  Filled 2022-11-10: qty 4

## 2022-11-10 MED ORDER — HYDROMORPHONE HCL 1 MG/ML IJ SOLN
0.5000 mg | Freq: Once | INTRAMUSCULAR | Status: AC
  Administered 2022-11-10: 0.5 mg via INTRAVENOUS
  Filled 2022-11-10: qty 0.5

## 2022-11-10 NOTE — ED Triage Notes (Addendum)
Pt with lower back pain since the other day. Denies any injury. Urine orange in color per pt and increase in frequency. Denies any burning with urination.

## 2022-11-10 NOTE — ED Provider Notes (Signed)
[facility] Provider Note   CSN: [id number] Arrival date & time: 11/10/22  1726     History  Chief Complaint  Patient presents with   Back Pain    [name] is a 57 y.o. male.  Patient complains of lower back pain.  Patient has a history of hypertension but he has not been taking his medicine  The history is provided by the patient and medical records.  Back Pain Location:  Lumbar spine Quality:  Aching Radiates to:  Does not radiate Pain severity:  Moderate Pain is:  Worse during the day Onset quality:  Sudden Timing:  Constant Progression:  Worsening Chronicity:  Recurrent Context: not emotional stress   Associated symptoms: no abdominal pain, no chest pain and no headaches        Home Medications Prior to Admission medications   Medication Sig Start Date End Date Taking? Authorizing Provider  allopurinol (ZYLOPRIM) 300 MG tablet Take 1 tablet (300 mg total) by mouth daily. Patient taking differently: Take 300 mg by mouth daily as needed. 08/04/21  Yes Sanjuana Kava, MD  amLODipine (NORVASC) 10 MG tablet TAKE 1 Tablet BY MOUTH ONCE EVERY DAY Patient taking differently: Take 10 mg by mouth daily. 07/20/22  Yes [name], PA-C  atorvastatin (LIPITOR) 80 MG tablet TAKE 1 Tablet BY MOUTH ONCE EVERY DAY Patient taking differently: Take 80 mg by mouth daily. 07/20/22  Yes [name], PA-C  Dulaglutide (TRULICITY) 3 0000000 SOPN Inject 3 mg as directed once a week. 10/14/22  Yes Brita Romp, NP  gabapentin (NEURONTIN) 100 MG capsule Take 1 capsule (100 mg total) by mouth 3 (three) times daily. 10/29/22  Yes Edrick Kins, DPM  glipiZIDE (GLUCOTROL XL) 5 MG 24 hr tablet Take 1 tablet (5 mg total) by mouth daily with breakfast. 05/31/22  Yes Reardon, Juanetta Beets, NP  insulin aspart (NOVOLOG FLEXPEN) 100 UNIT/ML FlexPen Inject 16-22 Units into the skin 3 (three) times daily with meals. 10/14/22  Yes Reardon, Loree Fee  J, NP  lisinopril (ZESTRIL) 20 MG tablet TAKE 1 Tablet BY MOUTH ONCE EVERY DAY Patient taking differently: Take 20 mg by mouth daily. 07/20/22  Yes [name], PA-C  metFORMIN (GLUCOPHAGE-XR) 500 MG 24 hr tablet TAKE 2 Tablets BY MOUTH ONCE EVERY DAY WITH BREAKFAST Patient taking differently: 500 mg. 07/20/22  Yes [name], PA-C  metoCLOPramide (REGLAN) 10 MG tablet Take 1 tablet (10 mg total) by mouth every 8 (eight) hours as needed for nausea. 08/24/22  Yes Godfrey Pick, MD  metoprolol tartrate (LOPRESSOR) 100 MG tablet TAKE 1 Tablet  BY MOUTH TWICE DAILY 07/20/22  Yes [name], PA-C  naproxen (NAPROSYN) 500 MG tablet Take 1 tablet (500 mg total) by mouth 2 (two) times daily. 11/10/22  Yes Milton Ferguson, MD  nystatin cream (MYCOSTATIN) Apply 1 application. topically 2 (two) times daily. To penile irritation. 01/21/22  Yes Irine Seal, MD  oxyCODONE-acetaminophen (PERCOCET) 10-325 MG tablet Take 1 tablet by mouth every 4 (four) hours as needed for pain. 10/12/22  Yes Sanjuana Kava, MD  sildenafil (VIAGRA) 100 MG tablet Take 1 tablet (100 mg total) by mouth daily as needed for erectile dysfunction. 09/09/22  Yes Irine Seal, MD  cyclobenzaprine (FLEXERIL) 10 MG tablet Take 1 tablet (10 mg total) by mouth 3 (three) times daily as needed for muscle spasms. Patient not taking: Reported on 11/10/2022 09/28/21   [name], PA-C  furosemide (LASIX) 20 MG tablet Take 1 tablet (  20 mg total) by mouth daily. Patient not taking: Reported on 11/10/2022 10/12/21   [name], PA-C  insulin glargine (LANTUS) 100 UNIT/ML injection Inject 0.8 mLs (80 Units total) into the skin daily. Patient not taking: Reported on 11/10/2022 10/14/22   Brita Romp, NP  oxyCODONE-acetaminophen (PERCOCET) 10-325 MG tablet Take 1 tablet by mouth every 4 (four) hours as needed for pain. Patient not taking: Reported on 11/10/2022 11/09/22   Sanjuana Kava, MD  pantoprazole (PROTONIX) 40 MG tablet Take 1 tablet  (40 mg total) by mouth daily. Patient not taking: Reported on 11/10/2022 08/24/22 09/23/22  Godfrey Pick, MD      Allergies    Patient has no known allergies.    Review of Systems   Review of Systems  Constitutional:  Negative for appetite change and fatigue.  HENT:  Negative for congestion, ear discharge and sinus pressure.   Eyes:  Negative for discharge.  Respiratory:  Negative for cough.   Cardiovascular:  Negative for chest pain.  Gastrointestinal:  Negative for abdominal pain and diarrhea.  Genitourinary:  Negative for frequency and hematuria.  Musculoskeletal:  Positive for back pain.  Skin:  Negative for rash.  Neurological:  Negative for seizures and headaches.  Psychiatric/Behavioral:  Negative for hallucinations.     Physical Exam Updated Vital Signs BP (!) 167/112 (BP Location: Right Arm)   Pulse 86   Temp 98.6 F (37 C) (Oral)   Resp 13   Ht '5\' 8"'$  (1.727 m)   Wt (!) 148.3 kg   SpO2 100%   BMI 49.72 kg/m  Physical Exam Vitals and nursing note reviewed.  Constitutional:      Appearance: He is well-developed. He is ill-appearing.  HENT:     Head: Normocephalic.     Nose: Nose normal.  Eyes:     General: No scleral icterus.    Conjunctiva/sclera: Conjunctivae normal.  Neck:     Thyroid: No thyromegaly.  Cardiovascular:     Rate and Rhythm: Normal rate and regular rhythm.     Heart sounds: No murmur heard.    No friction rub. No gallop.  Pulmonary:     Breath sounds: No stridor. No wheezing or rales.  Chest:     Chest wall: No tenderness.  Abdominal:     General: There is no distension.     Tenderness: There is no abdominal tenderness. There is no rebound.  Musculoskeletal:        General: Normal range of motion.     Cervical back: Neck supple.     Comments: Tenderness lumbar spine  Lymphadenopathy:     Cervical: No cervical adenopathy.  Skin:    Findings: No erythema or rash.  Neurological:     Mental Status: He is alert and oriented to person,  place, and time.     Motor: No abnormal muscle tone.     Coordination: Coordination normal.  Psychiatric:        Behavior: Behavior normal.     ED Results / Procedures / Treatments   Labs (all labs ordered are listed, but only abnormal results are displayed) Labs Reviewed  CBC WITH DIFFERENTIAL/PLATELET - Abnormal; Notable for the following components:      Result Value   WBC 11.4 (*)    All other components within normal limits  COMPREHENSIVE METABOLIC PANEL - Abnormal; Notable for the following components:   Glucose, Bld 134 (*)    Calcium 8.6 (*)    Albumin 3.3 (*)  AST 14 (*)    Total Bilirubin 0.1 (*)    All other components within normal limits  I-STAT CHEM 8, ED - Abnormal; Notable for the following components:   Glucose, Bld 131 (*)    All other components within normal limits  URINALYSIS, ROUTINE W REFLEX MICROSCOPIC  TYPE AND SCREEN    EKG None  Radiology CT Angio Chest/Abd/Pel for Dissection W and/or Wo Contrast  Result Date: 11/10/2022 CLINICAL DATA:  Chest and back pain, initial encounter EXAM: CT ANGIOGRAPHY CHEST, ABDOMEN AND PELVIS TECHNIQUE: Non-contrast CT of the chest was initially obtained. Multidetector CT imaging through the chest, abdomen and pelvis was performed using the standard protocol during bolus administration of intravenous contrast. Multiplanar reconstructed images and MIPs were obtained and reviewed to evaluate the vascular anatomy. RADIATION DOSE REDUCTION: This exam was performed according to the departmental dose-optimization program which includes automated exposure control, adjustment of the mA and/or kV according to patient size and/or use of iterative reconstruction technique. CONTRAST:  143m OMNIPAQUE IOHEXOL 350 MG/ML SOLN COMPARISON:  08/24/2022 FINDINGS: CTA CHEST FINDINGS Cardiovascular: Initial precontrast images show no hyperdense crescent to suggest acute aortic injury. Thoracic aorta shows a truncus anomaly. No aneurysmal  dilatation or dissection is noted. The heart is at the upper limits of normal in size. The pulmonary artery as visualized shows no large central pulmonary embolus. Timing was not performed for embolus evaluation. No coronary calcifications are seen. Mediastinum/Nodes: Thoracic inlet is within normal limits. No hilar or mediastinal adenopathy is noted. The esophagus as visualized is within normal limits. Lungs/Pleura: Lungs are well aerated bilaterally. No focal infiltrate or sizable effusion is noted. No parenchymal nodule is seen. No pneumothorax is noted. Musculoskeletal: Degenerative changes of the thoracic spine are seen. No acute rib abnormality is noted. Review of the MIP images confirms the above findings. CTA ABDOMEN AND PELVIS FINDINGS VASCULAR Aorta: Normal caliber aorta without aneurysm, dissection, vasculitis or significant stenosis. Celiac: Patent without evidence of aneurysm, dissection, vasculitis or significant stenosis. SMA: Patent without evidence of aneurysm, dissection, vasculitis or significant stenosis. Renals: Both renal arteries are patent without evidence of aneurysm, dissection, vasculitis, fibromuscular dysplasia or significant stenosis. IMA: Patent without evidence of aneurysm, dissection, vasculitis or significant stenosis. Inflow: Iliacs are within normal limits bilaterally. Veins: No specific venous abnormality is noted. Review of the MIP images confirms the above findings. NON-VASCULAR Hepatobiliary: Liver demonstrates diffuse decreased attenuation consistent with fatty infiltration. Gallbladder is well distended without complicating factors. Pancreas: Unremarkable. No pancreatic ductal dilatation or surrounding inflammatory changes. Spleen: Normal in size without focal abnormality. Adrenals/Urinary Tract: Adrenal glands again show a stable 19 mm right adrenal adenoma unchanged from multiple previous exams. This measures 28 mm Hounsfield units in density. Left adrenal gland is within  normal limits. Kidneys demonstrate a normal enhancement pattern bilaterally. No renal calculi or obstructive changes are seen. Bladder is within normal limits. Stomach/Bowel: No obstructive or inflammatory changes of the colon are seen. The appendix is within normal limits. Small bowel and stomach are unremarkable. A few loops of small bowel extended to a large fat containing umbilical/periumbilical hernia although no incarceration is seen. Changes of prior hernia repair are noted although the hernia has recurred along the inferior margin of the mesh. This is stable from the prior exam. Lymphatic: No lymphadenopathy is noted. Reproductive: Prostate is unremarkable. Other: Anterior abdominal wall hernia is noted along the inferior aspect of prior hernia repair. A few loops of small bowel extend into this hernia although do not show  evidence of incarceration or obstruction. The overall appearance is stable from the prior study. Musculoskeletal: Degenerative changes of lumbar spine and hip joints are noted. Review of the MIP images confirms the above findings. IMPRESSION: No evidence of aortic dissection or aneurysm. Stable right adrenal lesion consistent with adenoma. Fatty liver. Large umbilical/periumbilical fat containing hernia stable from the prior exam. This occurs along the inferior aspect of previously placed mesh. No bowel incarceration is noted. Electronically Signed   By: Inez Catalina M.D.   On: 11/10/2022 19:19    Procedures Procedures   Medications Ordered in ED Medications  HYDROmorphone (DILAUDID) injection 0.5 mg (0.5 mg Intravenous Given 11/10/22 1821)  labetalol (NORMODYNE) injection 20 mg (20 mg Intravenous Given 11/10/22 1826)  sodium chloride 0.9 % bolus 500 mL (500 mLs Intravenous Bolus 11/10/22 1823)  iohexol (OMNIPAQUE) 350 MG/ML injection 100 mL (100 mLs Intravenous Contrast Given 11/10/22 1902)    ED Course/ Medical Decision Making/ A&P   {  CRITICAL CARE Performed by: Milton Ferguson Total critical care time: 45 minutes Critical care time was exclusive of separately billable procedures and treating other patients. Critical care was necessary to treat or prevent imminent or life-threatening deterioration. Critical care was time spent personally by me on the following activities: development of treatment plan with patient and/or surrogate as well as nursing, discussions with consultants, evaluation of patient's response to treatment, examination of patient, obtaining history from patient or surrogate, ordering and performing treatments and interventions, ordering and review of laboratory studies, ordering and review of radiographic studies, pulse oximetry and re-evaluation of patient's condition.                           Medical Decision Making Amount and/or Complexity of Data Reviewed Labs: ordered. Radiology: ordered. ECG/medicine tests: ordered.  Risk Prescription drug management.  This patient presents to the ED for concern of back pain, this involves an extensive number of treatment options, and is a complaint that carries with it a high risk of complications and morbidity.  The differential diagnosis includes thoracic dissection, musculoskeletal   Co morbidities that complicate the patient evaluation  Hypertension is poorly controlled   Additional history obtained:  Additional history obtained from patient External records from outside source obtained and reviewed including hospital records   Lab Tests:  I Ordered, and personally interpreted labs.  The pertinent results include: White blood count 11.4   Imaging Studies ordered:  I ordered imaging studies including CT angio for dissection of the thoracic and abdominal aorta. I independently visualized and interpreted imaging which showed large periumbilical back containing hernia which is chronic I agree with the radiologist interpretation   Cardiac Monitoring: / EKG:  The patient was  maintained on a cardiac monitor.  I personally viewed and interpreted the cardiac monitored which showed an underlying rhythm of: Normal sinus rhythm   Consultations Obtained: None consultants Problem List / ED Course / Critical interventions / Medication management  Back pain and poorly controlled blood pressure I ordered medication including Dilaudid for pain and labetalol for blood pressure Reevaluation of the patient after these medicines showed that the patient improved I have reviewed the patients home medicines and have made adjustments as needed   Social Determinants of Health:  None   Test / Admission - Considered:  None  Patient with poorly controlled blood pressure and musculoskeletal pain in his lumbar region.  He has Percocets from his knee so he will be  taking that.  He is given Naprosyn and he will follow-up with his PCP for his blood pressure and his back   Final Clinical Impression(s) / ED Diagnoses Final diagnoses:  Strain of lumbar region, initial encounter  Hypertension, unspecified type    Rx / DC Orders ED Discharge Orders          Ordered    naproxen (NAPROSYN) 500 MG tablet  2 times daily        11/10/22 2002              Sueanne Maniaci, MD 11/12/22 1226

## 2022-11-10 NOTE — ED Notes (Signed)
Pt standing in room ready to go. Vs not taken. Went over [employer]. All questions answered. Declined wheelchair. Ambulatory to lobby with cane

## 2022-11-10 NOTE — Discharge Instructions (Addendum)
Follow-up with your family doctor next week for recheck.  Rest is much as possible.  Take your blood pressure medicine as prescribed

## 2022-11-10 NOTE — ED Notes (Signed)
Pt given urinal for urine sample 

## 2022-11-10 NOTE — ED Notes (Signed)
Pt back form CT

## 2022-11-16 ENCOUNTER — Telehealth: Payer: Self-pay | Admitting: Orthopaedic Surgery

## 2022-11-16 MED ORDER — OXYCODONE-ACETAMINOPHEN 10-325 MG PO TABS: 1.0000 | ORAL_TABLET | ORAL | 0 refills | Status: DC | PRN

## 2022-11-16 NOTE — Telephone Encounter (Signed)
Patient called, lvm requesting a refill on Oxycodone 10-325 to be sent to [facility]

## 2022-11-23 ENCOUNTER — Telehealth: Payer: Self-pay | Admitting: Orthopaedic Surgery

## 2022-11-23 MED ORDER — OXYCODONE-ACETAMINOPHEN 10-325 MG PO TABS: 1.0000 | ORAL_TABLET | ORAL | 0 refills | Status: DC | PRN

## 2022-11-23 NOTE — Telephone Encounter (Signed)
Dr. Brooke Bonito pt - pt lvm requesting a refill on Oxycodone 10-325 to be sent to [place].

## 2022-11-24 ENCOUNTER — Telehealth: Payer: Self-pay

## 2022-11-24 NOTE — Telephone Encounter (Signed)
Called to follow up with [facility] client who is transitioning to [facility] and recent ER Visit. No answer, left Message.   [employer]

## 2022-11-30 ENCOUNTER — Telehealth: Payer: Self-pay | Admitting: Orthopaedic Surgery

## 2022-11-30 MED ORDER — OXYCODONE-ACETAMINOPHEN 10-325 MG PO TABS: 1.0000 | ORAL_TABLET | ORAL | 0 refills | Status: DC | PRN

## 2022-11-30 NOTE — Telephone Encounter (Signed)
Dr. Brooke Bonito pt - pt lvm requesting a refill on his Oxycodone 10-325 to be sent to [facility].

## 2022-12-06 ENCOUNTER — Telehealth: Payer: Self-pay | Admitting: Podiatry

## 2022-12-06 NOTE — Telephone Encounter (Signed)
PT wanting a refill on gabapentin (NEURONTIN) 100 MG capsule    Please advise

## 2022-12-07 ENCOUNTER — Telehealth: Payer: Self-pay

## 2022-12-07 MED ORDER — OXYCODONE-ACETAMINOPHEN 10-325 MG PO TABS: 1.0000 | ORAL_TABLET | ORAL | 0 refills | Status: DC | PRN

## 2022-12-07 NOTE — Telephone Encounter (Signed)
Oxycodone-Acetaminophen 10/325 MG  Qty 25 Tablets  PATIENT USES [facility]

## 2022-12-08 ENCOUNTER — Other Ambulatory Visit: Payer: Self-pay | Admitting: Podiatry

## 2022-12-08 NOTE — Telephone Encounter (Signed)
Original Rx in February had 3 refills already. Please notify patient. Thanks, Dr. Amalia Hailey

## 2022-12-09 ENCOUNTER — Ambulatory Visit (INDEPENDENT_AMBULATORY_CARE_PROVIDER_SITE_OTHER): Payer: Medicaid Other | Admitting: Urology

## 2022-12-09 VITALS — BP 130/86 | HR 87 | Ht 68.0 in | Wt 327.0 lb

## 2022-12-09 DIAGNOSIS — N4883 Acquired buried penis: Secondary | ICD-10-CM

## 2022-12-09 DIAGNOSIS — N5201 Erectile dysfunction due to arterial insufficiency: Secondary | ICD-10-CM

## 2022-12-09 DIAGNOSIS — Z87438 Personal history of other diseases of male genital organs: Secondary | ICD-10-CM

## 2022-12-09 DIAGNOSIS — E230 Hypopituitarism: Secondary | ICD-10-CM

## 2022-12-09 DIAGNOSIS — R350 Frequency of micturition: Secondary | ICD-10-CM

## 2022-12-09 DIAGNOSIS — R351 Nocturia: Secondary | ICD-10-CM

## 2022-12-09 DIAGNOSIS — E291 Testicular hypofunction: Secondary | ICD-10-CM

## 2022-12-09 MED ORDER — TESTOSTERONE CYPIONATE 200 MG/ML IM SOLN: 100.0000 mg | INTRAMUSCULAR | 1 refills | Status: DC

## 2022-12-09 MED ORDER — SILDENAFIL CITRATE 100 MG PO TABS: 100.0000 mg | ORAL_TABLET | Freq: Every day | ORAL | 11 refills | Status: DC | PRN

## 2022-12-09 NOTE — Progress Notes (Signed)
Subjective: 1. Acquired buried penis   2. Erectile dysfunction due to arterial insufficiency   3. Hypogonadotropic hypogonadism   4. Nocturia   5. Urinary frequency     12/09/22: [name] returns today in f/u.  He has been losing weight on trulicity and is down 0000000.  His buried penis and phimosis have continued to improved.  He has persistent ED but didn't get the sildenafil but would still like to try that.  His testosterone was 140 in 1/24. His  PSA was 0.27.   09/09/22: [name] returns today in f/u.  He has continued to lose some weight and is having less of an issue with the buried penis.  He has stable LUTS with an IPSS of 22 but he attributes most of his symptoms to his diabetes.  UA is ok today.   He has ED and would like to try Viagra again.    5/18/23[name] returns today in f/u.  He has lost 30lb with diet but his glucose was 335 yesterday so he is still having frequency and nocturia.  He still has 3+ glucose in the urine today.   He has been using nystatin cream and needs a refill.  He didn't get the testosterone level that was ordered before and hasn't been able to get to [facility] to see the reconstructive urologist.    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  His PSA on 07/07/21 was stable at 0.26.   He is a very poorly controlled diabetic with a glucose of 539  and Hgb A1c of >15.5 on 09/28/21.  He hasn't had a recent testosterone level.   He is concerned today about a retracted penis and having to sit to void.   He is losing weight.  He has frequency and urgency with nocturia that is probably secondary to polydipsia from the diabetes.   UA has 3+ glucose but is otherwise clear.  ROS:  Review of Systems  All other systems reviewed and are negative.   No Known Allergies  Past Medical History:  Diagnosis Date   Arthritis    Diabetes mellitus without complication ([place])    diagnosed about age 89   Gout     Hypertension    boarderline    Past Surgical History:  Procedure Laterality Date   HERNIA REPAIR  2008   MASS EXCISION Left age 81   L thigh- benign    Social History   Socioeconomic History   Marital status: Single    Spouse name: Not on file   Number of children: Not on file   Years of education: Not on file   Highest education level: Not on file  Occupational History   Not on file  Tobacco Use   Smoking status: Former    Packs/day: 0.50    Years: 5.00    Additional pack years: 0.00    Total pack years: 2.50    Types: Cigarettes    Quit date: 25    Years since quitting: 34.2   Smokeless tobacco: Never  Vaping Use   Vaping Use: Never used  Substance and Sexual Activity   Alcohol use: No   Drug use: No   Sexual activity: Not on file  Other Topics Concern   Not on file  Social History Narrative   Not on file   Social Determinants of Health   Financial Resource Strain: Not on file  Food Insecurity: Not on file  Transportation Needs: Not on file  Physical Activity: Not on file  Stress: Not on file  Social Connections: Not on file  Intimate Partner Violence: Not on file    Family History  Problem Relation Age of Onset   Cancer Mother    Diabetes Mother    Heart failure Father    Hypertension Father    Diabetes Father    Heart attack Father     Anti-infectives: Anti-infectives (From admission, onward)    None       Current Outpatient Medications  Medication Sig Dispense Refill   allopurinol (ZYLOPRIM) 300 MG tablet Take 1 tablet (300 mg total) by mouth daily. (Patient taking differently: Take 300 mg by mouth daily as needed.) 30 tablet 5   amLODipine (NORVASC) 10 MG tablet TAKE 1 Tablet BY MOUTH ONCE EVERY DAY (Patient taking differently: Take 10 mg by mouth daily.) 90 tablet 0   atorvastatin (LIPITOR) 80 MG tablet TAKE 1 Tablet BY MOUTH ONCE EVERY DAY (Patient taking differently: Take 80 mg by mouth daily.) 90 tablet 0   cyclobenzaprine  (FLEXERIL) 10 MG tablet Take 1 tablet (10 mg total) by mouth 3 (three) times daily as needed for muscle spasms. 30 tablet 0   Dulaglutide (TRULICITY) 3 0000000 SOPN Inject 3 mg as directed once a week. 6 mL 1   furosemide (LASIX) 20 MG tablet Take 1 tablet (20 mg total) by mouth daily. 30 tablet 1   gabapentin (NEURONTIN) 100 MG capsule Take 1 capsule (100 mg total) by mouth 3 (three) times daily. 90 capsule 3   glipiZIDE (GLUCOTROL XL) 5 MG 24 hr tablet Take 1 tablet (5 mg total) by mouth daily with breakfast. 90 tablet 3   insulin aspart (NOVOLOG FLEXPEN) 100 UNIT/ML FlexPen Inject 16-22 Units into the skin 3 (three) times daily with meals. 45 mL 3   insulin glargine (LANTUS) 100 UNIT/ML injection Inject 0.8 mLs (80 Units total) into the skin daily. 60 mL 3   lisinopril (ZESTRIL) 20 MG tablet TAKE 1 Tablet BY MOUTH ONCE EVERY DAY (Patient taking differently: Take 20 mg by mouth daily.) 90 tablet 0   metFORMIN (GLUCOPHAGE-XR) 500 MG 24 hr tablet TAKE 2 Tablets BY MOUTH ONCE EVERY DAY WITH BREAKFAST (Patient taking differently: 500 mg.) 180 tablet 0   metoCLOPramide (REGLAN) 10 MG tablet Take 1 tablet (10 mg total) by mouth every 8 (eight) hours as needed for nausea. 30 tablet 0   metoprolol tartrate (LOPRESSOR) 100 MG tablet TAKE 1 Tablet  BY MOUTH TWICE DAILY 180 tablet 0   naproxen (NAPROSYN) 500 MG tablet Take 1 tablet (500 mg total) by mouth 2 (two) times daily. 20 tablet 0   nystatin cream (MYCOSTATIN) Apply 1 application. topically 2 (two) times daily. To penile irritation. 30 g 3   oxyCODONE-acetaminophen (PERCOCET) 10-325 MG tablet Take 1 tablet by mouth every 4 (four) hours as needed for pain. 25 tablet 0   testosterone cypionate (DEPOTESTOSTERONE CYPIONATE) 200 MG/ML injection Inject 0.5 mLs (100 mg total) into the muscle every 7 (seven) days. 10 mL 1   sildenafil (VIAGRA) 100 MG tablet Take 1 tablet (100 mg total) by mouth daily as needed for erectile dysfunction. 10 tablet 11   No  current facility-administered medications for this visit.     Objective: Vital signs in last 24 hours: BP 130/86   Pulse 87   Ht 5\' 8"  (1.727 m)   Wt (!) 327 lb (148.3 kg)  BMI 49.72 kg/m   Intake/Output from previous day: No intake/output data recorded. Intake/Output this shift: @   Physical Exam Vitals reviewed.  Constitutional:      Appearance: Normal appearance. He is obese.  Neurological:     Mental Status: He is alert.     Lab Results:  Results for orders placed or performed in visit on 12/09/22 (from the past 24 hour(s))  Urinalysis, Routine w reflex microscopic     Status: Abnormal   Collection Time: 12/09/22  3:33 PM  Result Value Ref Range   Specific Gravity, UA 1.020 1.005 - 1.030   pH, UA 7.0 5.0 - 7.5   Color, UA Amber (A) Yellow   Appearance Ur Clear Clear   Leukocytes,UA Negative Negative   Protein,UA Trace Negative/Trace   Glucose, UA Negative Negative   Ketones, UA Negative Negative   RBC, UA Negative Negative   Bilirubin, UA Negative Negative   Urobilinogen, Ur 0.2 0.2 - 1.0 mg/dL   Nitrite, UA Negative Negative   Microscopic Examination Comment    Narrative   Performed at:  [address] [facility] [address], [place], [place]  [id number] Lab Director: Chinita Pester MT, Phone:  [phone]        BMET No results for input(s): "NA", "K", "CL", "CO2", "GLUCOSE", "BUN", "CREATININE", "CALCIUM" in the last 72 hours. PT/INR No results for input(s): "LABPROT", "INR" in the last 72 hours. ABG No results for input(s): "PHART", "HCO3" in the last 72 hours.  Invalid input(s): "PCO2", "PO2" Recent Results (from the past 2160 hour(s))  Testosterone,Free and Total     Status: Abnormal   Collection Time: 09/21/22 10:08 AM  Result Value Ref Range   Testosterone 140 (L) 264 - 916 ng/dL    Comment: (NOTE) Adult male reference interval is based on a population of healthy nonobese males (BMI <30) between 33 and 14 years  old. Travison, et.al. JCEM [phone]. PMID: 13244010.    Testosterone, Free 4.4 (L) 7.2 - 24.0 pg/mL    Comment: (NOTE) Performed At: [facility] [address] [place], [place] [id number] Jolene Schimke MD IH:[id number]   HgB A1c     Status: Abnormal   Collection Time: 10/14/22  2:52 PM  Result Value Ref Range   Hemoglobin A1C 9.1 (A) 4.0 - 5.6 %   HbA1c POC (<> result, manual entry)     HbA1c, POC (prediabetic range)     HbA1c, POC (controlled diabetic range)    Urinalysis, Routine w reflex microscopic -Urine, Random     Status: None   Collection Time: 11/10/22  6:00 PM  Result Value Ref Range   Color, Urine YELLOW YELLOW   APPearance CLEAR CLEAR   Specific Gravity, Urine 1.019 1.005 - 1.030   pH 6.0 5.0 - 8.0   Glucose, UA NEGATIVE NEGATIVE mg/dL   Hgb urine dipstick NEGATIVE NEGATIVE   Bilirubin Urine NEGATIVE NEGATIVE   Ketones, ur NEGATIVE NEGATIVE mg/dL   Protein, ur NEGATIVE NEGATIVE mg/dL   Nitrite NEGATIVE NEGATIVE   Leukocytes,Ua NEGATIVE NEGATIVE    Comment: Performed at [facility], [address] [place], [place] [address]  CBC with Differential     Status: Abnormal   Collection Time: 11/10/22  6:19 PM  Result Value Ref Range   WBC 11.4 (H) 4.0 - 10.5 K/uL   RBC 5.21 4.22 - 5.81 MIL/uL   Hemoglobin 13.9 13.0 - 17.0 g/dL   HCT 32.9 51.8 - 84.1 %   MCV 87.7 80.0 - 100.0 fL  MCH 26.7 26.0 - 34.0 pg   MCHC 30.4 30.0 - 36.0 g/dL   RDW 20.2 54.2 - 70.6 %   Platelets 328 150 - 400 K/uL   nRBC 0.0 0.0 - 0.2 %   Neutrophils Relative % 67 %   Neutro Abs 7.7 1.7 - 7.7 K/uL   Lymphocytes Relative 23 %   Lymphs Abs 2.6 0.7 - 4.0 K/uL   Monocytes Relative 8 %   Monocytes Absolute 0.9 0.1 - 1.0 K/uL   Eosinophils Relative 1 %   Eosinophils Absolute 0.1 0.0 - 0.5 K/uL   Basophils Relative 1 %   Basophils Absolute 0.1 0.0 - 0.1 K/uL   Immature Granulocytes 0 %   Abs Immature Granulocytes 0.04 0.00 - 0.07 K/uL    Comment: Performed at [facility], [address], [place], [place] [address]  Comprehensive metabolic panel     Status: Abnormal   Collection Time: 11/10/22  6:19 PM  Result Value Ref Range   Sodium 135 135 - 145 mmol/L   Potassium 4.0 3.5 - 5.1 mmol/L   Chloride 100 98 - 111 mmol/L   CO2 29 22 - 32 mmol/L   Glucose, Bld 134 (H) 70 - 99 mg/dL    Comment: Glucose reference range applies only to samples taken after fasting for at least 8 hours.   BUN 9 6 - 20 mg/dL   Creatinine, Ser 8.31 0.61 - 1.24 mg/dL   Calcium 8.6 (L) 8.9 - 10.3 mg/dL   Total Protein 7.6 6.5 - 8.1 g/dL   Albumin 3.3 (L) 3.5 - 5.0 g/dL   AST 14 (L) 15 - 41 U/L   ALT 16 0 - 44 U/L   Alkaline Phosphatase 102 38 - 126 U/L   Total Bilirubin 0.1 (L) 0.3 - 1.2 mg/dL   GFR, Estimated >51 >76 mL/min    Comment: (NOTE) Calculated using the CKD-EPI Creatinine Equation (2021)    Anion gap 6 5 - 15    Comment: Performed at [facility], [address], [place], [place] [address]  Type and screen     Status: None   Collection Time: 11/10/22  6:19 PM  Result Value Ref Range   ABO/RH(D) O POS    Antibody Screen NEG    Sample Expiration      11/13/2022,2359 Performed at [facility], [address] [place], [place] [address]   I-stat chem 8, ED (not at [facility], [facility] or [facility])     Status: Abnormal   Collection Time: 11/10/22  6:33 PM  Result Value Ref Range   Sodium 137 135 - 145 mmol/L   Potassium 4.2 3.5 - 5.1 mmol/L   Chloride 99 98 - 111 mmol/L   BUN 8 6 - 20 mg/dL   Creatinine, Ser 6.94 0.61 - 1.24 mg/dL   Glucose, Bld 854 (H) 70 - 99 mg/dL    Comment: Glucose reference range applies only to samples taken after fasting for at least 8 hours.   Calcium, Ion 1.20 1.15 - 1.40 mmol/L   TCO2 31 22 - 32 mmol/L   Hemoglobin 16.0 13.0 - 17.0 g/dL   HCT 62.7 03.5 - 00.9 %  Urinalysis, Routine w reflex microscopic     Status: Abnormal   Collection Time: 12/09/22  3:33 PM  Result Value Ref Range   Specific Gravity, UA 1.020 1.005 - 1.030   pH, UA 7.0 5.0 -  7.5   Color, UA Amber (A) Yellow   Appearance Ur Clear Clear  Leukocytes,UA Negative Negative   Protein,UA Trace Negative/Trace   Glucose, UA Negative Negative   Ketones, UA Negative Negative   RBC, UA Negative Negative   Bilirubin, UA Negative Negative   Urobilinogen, Ur 0.2 0.2 - 1.0 mg/dL   Nitrite, UA Negative Negative   Microscopic Examination Comment     Comment: Microscopic not indicated and not performed.    Studies/Results: No results found.   Assessment/Plan: History of balanitis and a buried penis.  He continues to do well with further weight loss.   Hypogonadism.  His T was 140.  I will repeat that and start him on TRT with 100mg  IM qwk.  He will return for injection training and get labs in a month and 6 months.   ED.   He would like to try sildenafil again.  Script printed.   LUTS.  His voiding symptoms are stable.  His UA is unremarkable today and has no glucose.   Meds ordered this encounter  Medications   sildenafil (VIAGRA) 100 MG tablet    Sig: Take 1 tablet (100 mg total) by mouth daily as needed for erectile dysfunction.    Dispense:  10 tablet    Refill:  11   testosterone cypionate (DEPOTESTOSTERONE CYPIONATE) 200 MG/ML injection    Sig: Inject 0.5 mLs (100 mg total) into the muscle every 7 (seven) days.    Dispense:  10 mL    Refill:  1     Orders Placed This Encounter  Procedures   Urinalysis, Routine w reflex microscopic   Testosterone Free, Profile I    Needs morning fasting labs.    Standing Status:   Future    Standing Expiration Date:   12/09/2023   Testosterone Free, Profile I    Standing Status:   Future    Standing Expiration Date:   04/10/2023   Hemoglobin and hematocrit, blood    Standing Status:   Future    Standing Expiration Date:   06/10/2023   PSA    Standing Status:   Future    Standing Expiration Date:   06/10/2023   Testosterone Free, Profile I    Standing Status:   Future    Standing Expiration Date:   06/10/2023    Hemoglobin and hematocrit, blood    Standing Status:   Future    Standing Expiration Date:   12/09/2023     Return in about 6 months (around 06/10/2023) for labs in a month and then at f/u.  He will need a NV next week to teach testosterone injection. .    CC: Jacquelin Hawking PA.      [name] 12/10/2022 [phone]

## 2022-12-10 LAB — URINALYSIS, ROUTINE W REFLEX MICROSCOPIC
Bilirubin, UA: NEGATIVE
Glucose, UA: NEGATIVE
Ketones, UA: NEGATIVE
Leukocytes,UA: NEGATIVE
Nitrite, UA: NEGATIVE
RBC, UA: NEGATIVE
Specific Gravity, UA: 1.02 (ref 1.005–1.030)
Urobilinogen, Ur: 0.2 mg/dL (ref 0.2–1.0)
pH, UA: 7 (ref 5.0–7.5)

## 2022-12-14 ENCOUNTER — Telehealth: Payer: Self-pay | Admitting: Orthopaedic Surgery

## 2022-12-14 MED ORDER — OXYCODONE-ACETAMINOPHEN 10-325 MG PO TABS: 1.0000 | ORAL_TABLET | ORAL | 0 refills | Status: DC | PRN

## 2022-12-14 NOTE — Telephone Encounter (Signed)
Dr. Sanjuan Dame pt - spoke w/pt, he is requesting a refill on Oxycodone 10-325 to be sent to [facility]

## 2022-12-20 ENCOUNTER — Ambulatory Visit: Payer: Medicaid Other

## 2022-12-21 ENCOUNTER — Ambulatory Visit (INDEPENDENT_AMBULATORY_CARE_PROVIDER_SITE_OTHER): Payer: Medicaid Other | Admitting: Urology

## 2022-12-21 ENCOUNTER — Telehealth: Payer: Self-pay | Admitting: Orthopaedic Surgery

## 2022-12-21 DIAGNOSIS — E230 Hypopituitarism: Secondary | ICD-10-CM

## 2022-12-21 MED ORDER — OXYCODONE-ACETAMINOPHEN 10-325 MG PO TABS: 1.0000 | ORAL_TABLET | ORAL | 0 refills | Status: DC | PRN

## 2022-12-21 NOTE — Progress Notes (Cosign Needed Addendum)
IM injection teaching  Medication: Testosterone  Patient supplied medication  Patient tolerated well, no complications were noted  Performed by: Juanelle Trueheart LPN

## 2022-12-21 NOTE — Telephone Encounter (Signed)
Dr. Sanjuan Dame pt - spoke w/the patient, he's requesting a refill on his Oxycodone 10-325 to be sent to [facility].

## 2022-12-21 NOTE — Patient Instructions (Signed)

## 2022-12-23 ENCOUNTER — Other Ambulatory Visit: Payer: Self-pay

## 2022-12-23 MED ORDER — "NEEDLE (DISP) 18G X 1-1/2"" MISC": 12 refills | Status: AC

## 2022-12-23 MED ORDER — "SYRINGE/NEEDLE (DISP) 22G X 1"" 3 ML MISC": 12 refills | Status: AC

## 2022-12-28 ENCOUNTER — Telehealth: Payer: Self-pay | Admitting: Orthopaedic Surgery

## 2022-12-28 MED ORDER — OXYCODONE-ACETAMINOPHEN 10-325 MG PO TABS: 1.0000 | ORAL_TABLET | ORAL | 0 refills | Status: DC | PRN

## 2022-12-28 NOTE — Telephone Encounter (Signed)
Dr. Keeling's pt - pt lvm requesting a refill on Oxycodone 10-325 to be sent to [facility]. 

## 2022-12-29 ENCOUNTER — Telehealth: Payer: Self-pay

## 2022-12-29 NOTE — Telephone Encounter (Signed)
Patient did not receive a needle with the refill for the injection. The needle with the injection was not ordered with medication.   Pharmacy will need the order for the needle.

## 2022-12-29 NOTE — Telephone Encounter (Signed)
Patient is aware that syringe were sent to pharmacy. Patient voiced understanding

## 2023-01-04 ENCOUNTER — Telehealth: Payer: Self-pay | Admitting: Orthopaedic Surgery

## 2023-01-04 MED ORDER — OXYCODONE-ACETAMINOPHEN 10-325 MG PO TABS: 1.0000 | ORAL_TABLET | ORAL | 0 refills | Status: DC | PRN

## 2023-01-04 NOTE — Telephone Encounter (Signed)
Dr. Sanjuan Dame pt, he is out of the office - pt lvm requesting a refill on Oxycodone 10-325, 25 quantity, every 4 hours PRN for pain to be sent to [facility].

## 2023-01-11 ENCOUNTER — Encounter: Payer: Self-pay | Admitting: Orthopaedic Surgery

## 2023-01-11 ENCOUNTER — Telehealth: Payer: Self-pay

## 2023-01-11 ENCOUNTER — Ambulatory Visit: Payer: Medicaid Other | Admitting: Orthopaedic Surgery

## 2023-01-11 VITALS — BP 138/86 | HR 78 | Ht 68.0 in | Wt 327.0 lb

## 2023-01-11 DIAGNOSIS — M25561 Pain in right knee: Secondary | ICD-10-CM

## 2023-01-11 DIAGNOSIS — M25562 Pain in left knee: Secondary | ICD-10-CM

## 2023-01-11 DIAGNOSIS — M545 Low back pain, unspecified: Secondary | ICD-10-CM

## 2023-01-11 DIAGNOSIS — G8929 Other chronic pain: Secondary | ICD-10-CM

## 2023-01-11 DIAGNOSIS — Z6841 Body Mass Index (BMI) 40.0 and over, adult: Secondary | ICD-10-CM

## 2023-01-11 NOTE — Telephone Encounter (Signed)
Oxycodone-Acetaminophen 10/325 MG  Qty 25 Tablets  PATIENT USES [facility]

## 2023-01-11 NOTE — Progress Notes (Signed)
My knees and back hurt.  He has chronic pain of both knees.  He has no trauma.  He has developed lower back pain and pain to the toes bilaterally.  He was at urgent care recently for this and had X-rays and CT at the hospital.  I have reviewed the notes and X-rays.  His back pain is worse.  I have independently reviewed and interpreted x-rays of this patient done at another site by another physician or qualified health professional.  Spine/Pelvis examination:  Inspection:  Overall, sacoiliac joint benign and hips nontender; without crepitus or defects.   Thoracic spine inspection: Alignment normal without kyphosis present   Lumbar spine inspection:  Alignment  with normal lumbar lordosis, without scoliosis apparent.   Thoracic spine palpation:  without tenderness of spinal processes   Lumbar spine palpation: without tenderness of lumbar area; without tightness of lumbar muscles    Range of Motion:   Lumbar flexion, forward flexion is normal without pain or tenderness    Lumbar extension is full without pain or tenderness   Left lateral bend is normal without pain or tenderness   Right lateral bend is normal without pain or tenderness   Straight leg raising is normal  Strength & tone: normal   Stability overall normal stability  Both knees have crepitus, effusion, stable, ROM right 0 to 105, left 0 to 100.  Limp left.  NV intact.  Encounter Diagnoses  Name Primary?   Chronic pain of right knee Yes   Chronic pain of left knee    Lumbar pain    Morbid obesity due to excess calories (HCC)    Body mass index 45.0-49.9, adult (HCC)    I have reviewed the [place] Controlled Substance Reporting System web site prior to prescribing narcotic medicine for this patient.  I will see in one month.  X-rays of lumbar spine then.  He may need MRI.  I will re-evaluate pain medicine then.  Call if any problem.  Precautions discussed.  Electronically Signed Darreld Mclean,  MD 5/7/20242:25 PM

## 2023-01-12 ENCOUNTER — Ambulatory Visit (INDEPENDENT_AMBULATORY_CARE_PROVIDER_SITE_OTHER): Payer: Medicaid Other | Admitting: Nurse Practitioner

## 2023-01-12 ENCOUNTER — Encounter: Payer: Self-pay | Admitting: Nurse Practitioner

## 2023-01-12 VITALS — BP 107/74 | HR 114 | Ht 68.0 in | Wt 321.8 lb

## 2023-01-12 DIAGNOSIS — E1165 Type 2 diabetes mellitus with hyperglycemia: Secondary | ICD-10-CM

## 2023-01-12 DIAGNOSIS — E782 Mixed hyperlipidemia: Secondary | ICD-10-CM | POA: Diagnosis not present

## 2023-01-12 DIAGNOSIS — Z7984 Long term (current) use of oral hypoglycemic drugs: Secondary | ICD-10-CM

## 2023-01-12 DIAGNOSIS — Z794 Long term (current) use of insulin: Secondary | ICD-10-CM

## 2023-01-12 DIAGNOSIS — I10 Essential (primary) hypertension: Secondary | ICD-10-CM

## 2023-01-12 DIAGNOSIS — Z91199 Patient's noncompliance with other medical treatment and regimen due to unspecified reason: Secondary | ICD-10-CM

## 2023-01-12 LAB — POCT GLYCOSYLATED HEMOGLOBIN (HGB A1C): Hemoglobin A1C: 7.9 % — AB (ref 4.0–5.6)

## 2023-01-12 MED ORDER — DEXCOM G7 RECEIVER DEVI: 1.0000 | Freq: Once | 0 refills | Status: DC

## 2023-01-12 MED ORDER — INSULIN GLARGINE 100 UNIT/ML ~~LOC~~ SOLN: 80.0000 [IU] | Freq: Every day | SUBCUTANEOUS | 3 refills | Status: DC

## 2023-01-12 MED ORDER — GLUCOSE BLOOD VI STRP: ORAL_STRIP | 12 refills | Status: DC

## 2023-01-12 MED ORDER — NOVOLOG FLEXPEN 100 UNIT/ML ~~LOC~~ SOPN: 10.0000 [IU] | PEN_INJECTOR | Freq: Three times a day (TID) | SUBCUTANEOUS | 3 refills | Status: DC

## 2023-01-12 MED ORDER — OXYCODONE-ACETAMINOPHEN 10-325 MG PO TABS: 1.0000 | ORAL_TABLET | ORAL | 0 refills | Status: DC | PRN

## 2023-01-12 MED ORDER — TRULICITY 4.5 MG/0.5ML ~~LOC~~ SOAJ: 4.5000 mg | SUBCUTANEOUS | 3 refills | Status: DC

## 2023-01-12 MED ORDER — GLIPIZIDE ER 5 MG PO TB24: 5.0000 mg | ORAL_TABLET | Freq: Every day | ORAL | 3 refills | Status: DC

## 2023-01-12 MED ORDER — METFORMIN HCL ER 500 MG PO TB24: 1000.0000 mg | ORAL_TABLET | Freq: Every day | ORAL | 3 refills | Status: DC

## 2023-01-12 MED ORDER — DEXCOM G7 SENSOR MISC: 1.0000 | 3 refills | Status: DC

## 2023-01-12 NOTE — Progress Notes (Signed)
01/12/2023, 3:55 PM      Endocrinology Follow Up Visit  Subjective:    Patient ID: [name], male    DOB: Oct 27, 1965.  [name] is being seen in follow up for management of currently uncontrolled symptomatic diabetes requested by  Mirna Mires, MD.   Past Medical History:  Diagnosis Date   Arthritis    Diabetes mellitus without complication [facility])    diagnosed about age 57   Gout    Hypertension    boarderline    Past Surgical History:  Procedure Laterality Date   HERNIA REPAIR  2008   MASS EXCISION Left age 57   L thigh- benign    Social History   Socioeconomic History   Marital status: Single    Spouse name: Not on file   Number of children: Not on file   Years of education: Not on file   Highest education level: Not on file  Occupational History   Not on file  Tobacco Use   Smoking status: Former    Packs/day: 0.50    Years: 5.00    Additional pack years: 0.00    Total pack years: 2.50    Types: Cigarettes    Quit date: 77    Years since quitting: 34.3   Smokeless tobacco: Never  Vaping Use   Vaping Use: Never used  Substance and Sexual Activity   Alcohol use: No   Drug use: No   Sexual activity: Not on file  Other Topics Concern   Not on file  Social History Narrative   Not on file   Social Determinants of Health   Financial Resource Strain: Not on file  Food Insecurity: Not on file  Transportation Needs: Not on file  Physical Activity: Not on file  Stress: Not on file  Social Connections: Not on file    Family History  Problem Relation Age of Onset   Cancer Mother    Diabetes Mother    Heart failure Father    Hypertension Father    Diabetes Father    Heart attack Father     Outpatient Encounter Medications as of 01/12/2023  Medication Sig   allopurinol (ZYLOPRIM) 300 MG tablet Take 1 tablet (300 mg total) by mouth daily. (Patient taking differently: Take 300  mg by mouth daily as needed.)   amLODipine (NORVASC) 10 MG tablet TAKE 1 Tablet BY MOUTH ONCE EVERY DAY (Patient taking differently: Take 10 mg by mouth daily.)   atorvastatin (LIPITOR) 80 MG tablet TAKE 1 Tablet BY MOUTH ONCE EVERY DAY (Patient taking differently: Take 80 mg by mouth daily.)   Continuous Glucose Receiver (DEXCOM G7 RECEIVER) DEVI 1 Device by Does not apply route once for 1 dose.   Continuous Glucose Sensor (DEXCOM G7 SENSOR) MISC Inject 1 Application into the skin as directed. Change sensor every 10 days as directed.   cyclobenzaprine (FLEXERIL) 10 MG tablet Take 1 tablet (10 mg total) by mouth 3 (three) times daily as needed for muscle spasms.   Dulaglutide (TRULICITY) 4.5 MG/0.5ML SOPN Inject 4.5 mg as directed once a week.   furosemide (LASIX) 20 MG tablet Take 1 tablet (20 mg total) by mouth daily.   gabapentin (  NEURONTIN) 100 MG capsule Take 1 capsule (100 mg total) by mouth 3 (three) times daily.   glucose blood test strip Use as instructed to monitor glucose 4 times daily   lisinopril (ZESTRIL) 20 MG tablet TAKE 1 Tablet BY MOUTH ONCE EVERY DAY (Patient taking differently: Take 20 mg by mouth daily.)   metoCLOPramide (REGLAN) 10 MG tablet Take 1 tablet (10 mg total) by mouth every 8 (eight) hours as needed for nausea.   metoprolol tartrate (LOPRESSOR) 100 MG tablet TAKE 1 Tablet  BY MOUTH TWICE DAILY   naproxen (NAPROSYN) 500 MG tablet Take 1 tablet (500 mg total) by mouth 2 (two) times daily.   NEEDLE, DISP, 18 G 18G X 1-1/2" MISC Use to draw up testosterone for injection   nystatin cream (MYCOSTATIN) Apply 1 application. topically 2 (two) times daily. To penile irritation.   oxyCODONE-acetaminophen (PERCOCET) 10-325 MG tablet Take 1 tablet by mouth every 4 (four) hours as needed for pain.   sildenafil (VIAGRA) 100 MG tablet Take 1 tablet (100 mg total) by mouth daily as needed for erectile dysfunction.   SYRINGE-NEEDLE, DISP, 3 ML 22G X 1" 3 ML MISC Use to inject  testosterone   testosterone cypionate (DEPOTESTOSTERONE CYPIONATE) 200 MG/ML injection Inject 0.5 mLs (100 mg total) into the muscle every 7 (seven) days.   [DISCONTINUED] Dulaglutide (TRULICITY) 3 MG/0.5ML SOPN Inject 3 mg as directed once a week.   [DISCONTINUED] glipiZIDE (GLUCOTROL XL) 5 MG 24 hr tablet Take 1 tablet (5 mg total) by mouth daily with breakfast.   [DISCONTINUED] insulin aspart (NOVOLOG FLEXPEN) 100 UNIT/ML FlexPen Inject 16-22 Units into the skin 3 (three) times daily with meals.   [DISCONTINUED] metFORMIN (GLUCOPHAGE-XR) 500 MG 24 hr tablet TAKE 2 Tablets BY MOUTH ONCE EVERY DAY WITH BREAKFAST (Patient taking differently: 500 mg.)   glipiZIDE (GLUCOTROL XL) 5 MG 24 hr tablet Take 1 tablet (5 mg total) by mouth daily with breakfast.   insulin aspart (NOVOLOG FLEXPEN) 100 UNIT/ML FlexPen Inject 10-16 Units into the skin 3 (three) times daily with meals.   insulin glargine (LANTUS) 100 UNIT/ML injection Inject 0.8 mLs (80 Units total) into the skin daily.   metFORMIN (GLUCOPHAGE-XR) 500 MG 24 hr tablet Take 2 tablets (1,000 mg total) by mouth daily with breakfast.   [DISCONTINUED] insulin glargine (LANTUS) 100 UNIT/ML injection Inject 0.8 mLs (80 Units total) into the skin daily. (Patient not taking: Reported on 01/12/2023)   [DISCONTINUED] oxyCODONE-acetaminophen (PERCOCET) 10-325 MG tablet Take 1 tablet by mouth every 4 (four) hours as needed for pain.   No facility-administered encounter medications on file as of 01/12/2023.    ALLERGIES: No Known Allergies  VACCINATION STATUS: Immunization History  Administered Date(s) Administered   Influenza,inj,Quad PF,6+ Mos 06/30/2017   Moderna Sars-Covid-2 Vaccination 04/01/2020, 04/29/2020    Diabetes He presents for his follow-up diabetic visit. He has type 2 diabetes mellitus. Onset time: He was diagnosed at approximate age of 49 years. His disease course has been improving. Pertinent negatives for hypoglycemia include no  confusion, headaches, pallor, seizures, sweats or tremors. Associated symptoms include blurred vision, fatigue, polydipsia, polyphagia, polyuria and weight loss. Pertinent negatives for diabetes include no chest pain and no weakness. There are no hypoglycemic complications. Symptoms are improving. There are no diabetic complications. Risk factors for coronary artery disease include dyslipidemia, diabetes mellitus, hypertension, male sex, obesity and sedentary lifestyle. Current diabetic treatment includes intensive insulin program and oral agent (dual therapy) (and Trulicity). He is compliant with treatment most of the  time. His weight is decreasing steadily. He is following a generally unhealthy diet. When asked about meal planning, he reported none. He has not had a previous visit with a dietitian. He never participates in exercise. His home blood glucose trend is decreasing steadily. His overall blood glucose range is 180-200 mg/dl. (He presents today with his meter, no logs, showing greatly improved glycemic profile overall but some inconsistencies checking his glucose.  His POCT A1c today is 7.9%, improving from last visit of 9.1%.  He stopped his Lantus between visits, was thinking it was making his legs ache.  He also notes he has run out of his Trulicity and is needing PA.  Analysis of his meter shows 7-day average of 195, 14-day average of 184, 30-day average of 183.) An ACE inhibitor/angiotensin II receptor blocker is being taken. He does not see a podiatrist.Eye exam is current.  Hyperlipidemia This is a chronic problem. The current episode started more than 1 year ago. The problem is uncontrolled. Recent lipid tests were reviewed and are high. Exacerbating diseases include diabetes and obesity. Factors aggravating his hyperlipidemia include beta blockers and fatty foods. Pertinent negatives include no chest pain, myalgias or shortness of breath. Current antihyperlipidemic treatment includes statins.  The current treatment provides moderate improvement of lipids. Compliance problems include adherence to diet, adherence to exercise and medication cost.  Risk factors for coronary artery disease include dyslipidemia, diabetes mellitus, family history, hypertension, male sex, obesity and a sedentary lifestyle.  Hypertension This is a chronic problem. The current episode started more than 1 year ago. The problem has been resolved since onset. The problem is controlled. Associated symptoms include blurred vision. Pertinent negatives include no chest pain, headaches, malaise/fatigue, neck pain, palpitations, shortness of breath or sweats. There are no associated agents to hypertension. Risk factors for coronary artery disease include dyslipidemia, diabetes mellitus, male gender, obesity and sedentary lifestyle. Past treatments include ACE inhibitors, beta blockers, calcium channel blockers and diuretics. The current treatment provides mild improvement. Compliance problems include medication cost, diet and exercise.     Review of systems  Constitutional: + decreasing body weight,  current Body mass index is 48.93 kg/m., + fatigue, no subjective hyperthermia, no subjective hypothermia Eyes: + blurry vision, no xerophthalmia ENT: no sore throat, no nodules palpated in throat, no dysphagia/odynophagia, no hoarseness Cardiovascular: no chest pain, no shortness of breath, no palpitations, no leg swelling Respiratory: no cough, no shortness of breath Gastrointestinal: no nausea/vomiting/diarrhea Musculoskeletal: + generalized muscle/joint aches Skin: no rashes, no hyperemia Neurological: no tremors, no numbness, no tingling, no dizziness Psychiatric: no depression, no anxiety    Objective:    BP 107/74 (BP Location: Left Arm, Patient Position: Sitting, Cuff Size: Large)   Pulse (!) 114   Ht 5\' 8"  (1.727 m)   Wt (!) 321 lb 12.8 oz (146 kg)   BMI 48.93 kg/m   Wt Readings from Last 3 Encounters:   01/12/23 (!) 321 lb 12.8 oz (146 kg)  01/11/23 (!) 327 lb (148.3 kg)  12/09/22 (!) 327 lb (148.3 kg)    BP Readings from Last 3 Encounters:  01/12/23 107/74  01/11/23 138/86  12/09/22 130/86    Physical Exam- Limited  Constitutional:  Body mass index is 48.93 kg/m. , not in acute distress, normal state of mind Eyes:  EOMI, no exophthalmos Neck: Supple Musculoskeletal: no gross deformities, strength intact in all four extremities, no gross restriction of joint movements Skin:  no rashes, no hyperemia Neurological: no tremor with outstretched hands  CMP ( most recent) CMP     Component Value Date/Time   NA 137 11/10/2022 1833   K 4.2 11/10/2022 1833   CL 99 11/10/2022 1833   CO2 29 11/10/2022 1819   GLUCOSE 131 (H) 11/10/2022 1833   BUN 8 11/10/2022 1833   CREATININE 0.70 11/10/2022 1833   CREATININE 1.42 (H) 09/08/2017 1540   CALCIUM 8.6 (L) 11/10/2022 1819   PROT 7.6 11/10/2022 1819   ALBUMIN 3.3 (L) 11/10/2022 1819   AST 14 (L) 11/10/2022 1819   ALT 16 11/10/2022 1819   ALKPHOS 102 11/10/2022 1819   BILITOT 0.1 (L) 11/10/2022 1819   GFRNONAA >60 11/10/2022 1819   GFRAA >60 03/17/2020 1200     Diabetic Labs (most recent): Lab Results  Component Value Date   HGBA1C 7.9 (A) 01/12/2023   HGBA1C 9.1 (A) 10/14/2022   HGBA1C 14.5 (A) 07/12/2022   MICROALBUR 7.2 (H) 07/19/2022   MICROALBUR <3.0 (H) 07/07/2021   MICROALBUR 80 02/26/2021     Lipid Panel ( most recent) Lipid Panel     Component Value Date/Time   CHOL 263 (H) 07/19/2022 1337   TRIG 77 07/19/2022 1337   HDL 62 07/19/2022 1337   CHOLHDL 4.2 07/19/2022 1337   VLDL 15 07/19/2022 1337   LDLCALC 186 (H) 07/19/2022 1337      Lab Results  Component Value Date   TSH 0.532 06/28/2017       Assessment & Plan:   1) Uncontrolled type 2 diabetes mellitus with hyperglycemia (HCC)  - [name] has currently uncontrolled symptomatic type 2 DM since  57 years of age.  He presents today with  his meter, no logs, showing greatly improved glycemic profile overall but some inconsistencies checking his glucose.  His POCT A1c today is 7.9%, improving from last visit of 9.1%.  He stopped his Lantus between visits, was thinking it was making his legs ache.  He also notes he has run out of his Trulicity and is needing PA.  Analysis of his meter shows 7-day average of 195, 14-day average of 184, 30-day average of 183.  -Recent labs reviewed.  - I had a long discussion with him about the progressive nature of diabetes and the pathology behind its complications. -his diabetes is complicated by obesity/sedentary life, inadequate insurance coverage,  And he remains at a high risk for more acute and chronic complications which include CAD, CVA, CKD, retinopathy, and neuropathy. These are all discussed in detail with him.  - Nutritional counseling repeated at each appointment due to patients tendency to fall back in to old habits.  - The patient admits there is a room for improvement in their diet and drink choices. -  Suggestion is made for the patient to avoid simple carbohydrates from their diet including Cakes, Sweet Desserts / Pastries, Ice Cream, Soda (diet and regular), Sweet Tea, Candies, Chips, Cookies, Sweet Pastries, Store Bought Juices, Alcohol in Excess of 1-2 drinks a day, Artificial Sweeteners, Coffee Creamer, and "Sugar-free" Products. This will help patient to have stable blood glucose profile and potentially avoid unintended weight gain.   - I encouraged the patient to switch to unprocessed or minimally processed complex starch and increased protein intake (animal or plant source), fruits, and vegetables.   - Patient is advised to stick to a routine mealtimes to eat 3 meals a day and avoid unnecessary snacks (to snack only to correct hypoglycemia).  - he will be scheduled with Norm Salt, RDN, CDE for diabetes education.  -  I have approached him with the following individualized  plan to manage  his diabetes and patient agrees:   -He is advised to restart his Lantus at 50 units SQ nightly, lower his Novolog to 10-16 units TID with meals if glucose is above 90 and he is eating (Specific instructions on how to titrate insulin dosage based on glucose readings given to patient in writing), continue Metformin 1000 mg ER PO daily with breakfast, and Glipizide 5 mg XL daily with breakfast.  Will increase his Trulicity to 4.5 mg SQ weekly in hopes to decrease his insulin burden in the future.   He is encouraged consistently monitor blood glucose 4 times per day, before meals and at bedtime and report to the clinic if readings are less than 70 or greater than 300 for 3 tests in a row.  He can benefit from CGM device given his MDI and improving glycemic profile.  This will be important in preventing hypoglycemia.  Will send in for Dexcom G7 with receiver to local pharmacy.  - Specific targets for  A1c;  LDL, HDL,  and Triglycerides were discussed with the patient.  2) Blood Pressure /Hypertension:   His blood pressure is controlled to target.  He is advised to continue Norvasc 5 mg po daily, Lasix 20 mg po daily, Lisinopril 20 mg po daily, and Metoprolol 100 mg po twice daily.    3) Lipids/Hyperlipidemia:   His recent lipid panel from 07/19/22 shows uncontrolled LDL of 186.  He is advised to continue Atorvastatin 40 mg po daily at bedtime.  Side effects and precautions discussed with him.    4)  Weight/Diet:  His Body mass index is 48.93 kg/m.-   clearly complicating his diabetes care.   he is  a candidate for modest weight loss. I discussed with him the fact that loss of 5 - 10% of his  current body weight will have the most impact on his diabetes management.  Exercise, and detailed carbohydrates information provided  -  detailed on discharge instructions.  He is a good candidate for bariatric surgery, however at this point he has no insurance.  5) Chronic Care/Health  Maintenance: -he is on ACEI/ARB and Statin medications and is encouraged to initiate and continue to follow up with Ophthalmology, Dentist,  Podiatrist at least yearly or according to recommendations, and advised to stay away from smoking. I have recommended yearly flu vaccine and pneumonia vaccine at least every 5 years; moderate intensity exercise for up to 150 minutes weekly; and  sleep for at least 7 hours a day.  - he is advised to maintain close follow up with Mirna Mires, MD for primary care needs, as well as his other providers for optimal and coordinated care.      I spent  33  minutes in the care of the patient today including review of labs from CMP, Lipids, Thyroid Function, Hematology (current and previous including abstractions from other facilities); face-to-face time discussing  his blood glucose readings/logs, discussing hypoglycemia and hyperglycemia episodes and symptoms, medications doses, his options of short and long term treatment based on the latest standards of care / guidelines;  discussion about incorporating lifestyle medicine;  and documenting the encounter. Risk reduction counseling performed per USPSTF guidelines to reduce obesity and cardiovascular risk factors.     Please refer to Patient Instructions for Blood Glucose Monitoring and Insulin/Medications Dosing Guide"  in media tab for additional information. Please  also refer to " Patient Self Inventory" in the  Media  tab for reviewed elements of pertinent patient history.  [name] participated in the discussions, expressed understanding, and voiced agreement with the above plans.  All questions were answered to his satisfaction. he is encouraged to contact clinic should he have any questions or concerns prior to his return visit.   Follow up plan: - Return in about 3 months (around 04/14/2023) for Diabetes F/U with A1c in office, No previsit labs, Bring meter and logs.  Ronny Bacon, [facility] [address] [place], [place] [address] Phone: [phone] Fax: [phone]  01/12/2023, 3:55 PM

## 2023-01-14 ENCOUNTER — Telehealth: Payer: Self-pay | Admitting: *Deleted

## 2023-01-14 NOTE — Telephone Encounter (Signed)
Patient left a message on 01/13/2023, he stated that he had questions about his Novolog. Patient was seen on 01/12/2023. I called the patient back today , left the instructions that Whitney had given him at the time of his office visit, on his voicemail. Also , ask that he please restart his Lantus as instructed.  Any questions call office back.

## 2023-01-18 ENCOUNTER — Telehealth: Payer: Self-pay | Admitting: Orthopaedic Surgery

## 2023-01-18 MED ORDER — OXYCODONE-ACETAMINOPHEN 10-325 MG PO TABS: 1.0000 | ORAL_TABLET | ORAL | 0 refills | Status: DC | PRN

## 2023-01-18 NOTE — Telephone Encounter (Signed)
Dr. Sanjuan Dame pt - pt lvm requesting a refill on Oxycodone to be sent to [facility]

## 2023-01-25 ENCOUNTER — Other Ambulatory Visit (HOSPITAL_COMMUNITY): Payer: Self-pay

## 2023-01-25 ENCOUNTER — Telehealth: Payer: Self-pay

## 2023-01-25 ENCOUNTER — Telehealth: Payer: Self-pay | Admitting: Orthopaedic Surgery

## 2023-01-25 MED ORDER — OXYCODONE-ACETAMINOPHEN 10-325 MG PO TABS: 1.0000 | ORAL_TABLET | ORAL | 0 refills | Status: DC | PRN

## 2023-01-25 NOTE — Telephone Encounter (Signed)
[name] Patient called and left message AT 8:27 AM  stating he want's a refill on his pain medicine and to send it to [facility]

## 2023-01-25 NOTE — Telephone Encounter (Signed)
Patient Advocate Encounter   Received notification from [facility] that prior authorization is required for [facility] G7 Sensor  Submitted: 01/25/23 Key [id number]  Status is pending

## 2023-01-25 NOTE — Telephone Encounter (Signed)
Patient Advocate Encounter   Received notification from [facility] that prior authorization is required for Trulicity 4.5MG /0.5ML pen-injectors  Submitted: 01/25/23 Key [id number]  Status is pending

## 2023-01-25 NOTE — Telephone Encounter (Signed)
Patient Advocate Encounter   Received notification from [facility] that prior authorization is required for [facility] G7 receiver   Submitted: 01/25/23 Key [id number]  Status is pending

## 2023-01-27 NOTE — Telephone Encounter (Signed)
Patient was called and a message ws left letting him know that the Trulicity had been approved.

## 2023-01-27 NOTE — Telephone Encounter (Signed)
Patient was called and a message was left that his Dexcom G7 sensor was approved.

## 2023-01-27 NOTE — Telephone Encounter (Signed)
Pharmacy Patient Advocate Encounter  Prior Authorization for Dexcom G7 receiver  has been approved    Effective dates: 01/25/23 through 07/28/23

## 2023-01-27 NOTE — Telephone Encounter (Signed)
Patient was called and a message was left letting him know that his Dexcom G 7 receiver was approved.

## 2023-01-27 NOTE — Telephone Encounter (Signed)
Pharmacy Patient Advocate Encounter  Prior Authorization for Dexcom G7 sensor has been approved   Effective dates: 01/25/23 through 07/28/23

## 2023-01-27 NOTE — Telephone Encounter (Signed)
Pharmacy Patient Advocate Encounter  Prior Authorization for Trulicity 4.5MG /0.5ML pen-injectors  has been approved  Effective dates: 01/25/23 through 01/25/24

## 2023-02-01 ENCOUNTER — Telehealth: Payer: Self-pay | Admitting: Radiology

## 2023-02-01 MED ORDER — OXYCODONE-ACETAMINOPHEN 10-325 MG PO TABS: 1.0000 | ORAL_TABLET | ORAL | 0 refills | Status: DC | PRN

## 2023-02-01 NOTE — Telephone Encounter (Signed)
Refill oxycodone requested, [facility].

## 2023-02-01 NOTE — Addendum Note (Signed)
Addended by: Earnstine Regal on: 02/01/2023 01:29 PM   Modules accepted: Orders

## 2023-02-01 NOTE — Addendum Note (Signed)
Addended by: Michaele Offer on: 02/01/2023 01:23 PM   Modules accepted: Orders

## 2023-02-02 ENCOUNTER — Ambulatory Visit (INDEPENDENT_AMBULATORY_CARE_PROVIDER_SITE_OTHER): Payer: Medicaid Other | Admitting: Podiatry

## 2023-02-02 DIAGNOSIS — B351 Tinea unguium: Secondary | ICD-10-CM | POA: Diagnosis not present

## 2023-02-02 DIAGNOSIS — L97512 Non-pressure chronic ulcer of other part of right foot with fat layer exposed: Secondary | ICD-10-CM

## 2023-02-02 DIAGNOSIS — E0843 Diabetes mellitus due to underlying condition with diabetic autonomic (poly)neuropathy: Secondary | ICD-10-CM

## 2023-02-02 DIAGNOSIS — M79675 Pain in left toe(s): Secondary | ICD-10-CM

## 2023-02-02 DIAGNOSIS — M79674 Pain in right toe(s): Secondary | ICD-10-CM

## 2023-02-02 NOTE — Progress Notes (Signed)
   Chief Complaint  Patient presents with   Diabetes    Diabetic foot care, nail trim and callus, A1c- 7.9,     Subjective:  57 y.o. male with PMHx of diabetes mellitus presenting today for routine footcare.  Patient states that he continues to have pain and tenderness especially to the toes at night when he sleeps.  He has been experiencing some pain and tenderness also to the plantar aspect of the right great toe.   Past Medical History:  Diagnosis Date   Arthritis    Diabetes mellitus without complication (HCC)    diagnosed about age 36   Gout    Hypertension    boarderline    Past Surgical History:  Procedure Laterality Date   HERNIA REPAIR  2008   MASS EXCISION Left age 45   L thigh- benign    No Known Allergies    RT foot 02/02/2023  Objective/Physical Exam General: The patient is alert and oriented x3 in no acute distress.  Dermatology:  Wound #1 noted to the plantar aspect of the first MTP right foot measuring approximately one 3.0 x 2.5 x 0.2 cm (LxWxD).  Please see above noted photo To the noted ulceration(s), there is no eschar. There is a moderate amount of slough, fibrin, and necrotic tissue noted. Granulation tissue and wound base is red. There is a minimal amount of serosanguineous drainage noted. There is no exposed bone muscle-tendon ligament or joint. There is no malodor. Periwound integrity is intact. Hyperkeratotic dystrophic elongated nails also noted 1-5 bilateral.  Skin is warm, dry and supple bilateral lower extremities.  Vascular: Skin is warm to touch.  Capillary refill WNL.  However the patient does experience rest pain especially in the toes concerning for possible ischemia  Neurological: Light touch and protective threshold diminished bilaterally.   Musculoskeletal Exam: Range of motion within normal limits to all pedal and ankle joints bilateral. Muscle strength 5/5 in all groups bilateral.   Assessment: 1.  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 diabetes mellitus w/ peripheral neuropathy 3.  Pain due to onychomycosis of toenails both  Plan of Care:  1. Patient was evaluated. 2. medically necessary excisional debridement including subcutaneous tissue was performed using a tissue nipper and a chisel blade. Excisional debridement of all the necrotic nonviable tissue down to healthy bleeding viable tissue was performed with post-debridement measurements same as pre-.  Betadine ointment and a dressing applied.  Betadine provided to apply daily 3.  Mechanical debridement of nails 1-5 bilateral was performed using a nail nipper without incident or bleeding. 4.  Advised against going barefoot.  Recommend good supportive shoes and sneakers at all times 5.  Order placed for arterial ABIs to establish a vascular baseline 6.  Return to clinic 4 weeks for follow-up of the right foot ulcer   Felecia Shelling, DPM [facility]  Dr. Felecia Shelling, DPM    [address] [place], [place] [address]                Office [phone]  Fax [phone]

## 2023-02-04 ENCOUNTER — Telehealth: Payer: Self-pay

## 2023-02-04 NOTE — Telephone Encounter (Signed)
Transitioned [facility] client to [facility]. Following with A1C data. Reviewed labs latest A1C down to 7.9 on 01/12/23 with [facility].  Attempted to call Mr Graul for follow up after transitioning to Medicaid, No answer, left message requesting return call.   Francee Nodal RN [employer]

## 2023-02-08 ENCOUNTER — Telehealth: Payer: Self-pay | Admitting: Orthopaedic Surgery

## 2023-02-08 ENCOUNTER — Encounter: Payer: Self-pay | Admitting: Orthopaedic Surgery

## 2023-02-08 ENCOUNTER — Ambulatory Visit: Payer: Medicaid Other | Admitting: Orthopaedic Surgery

## 2023-02-08 ENCOUNTER — Other Ambulatory Visit (INDEPENDENT_AMBULATORY_CARE_PROVIDER_SITE_OTHER): Payer: Medicaid Other

## 2023-02-08 VITALS — BP 202/123 | HR 103 | Ht 68.0 in | Wt 320.0 lb

## 2023-02-08 DIAGNOSIS — M545 Low back pain, unspecified: Secondary | ICD-10-CM

## 2023-02-08 MED ORDER — OXYCODONE-ACETAMINOPHEN 10-325 MG PO TABS: 1.0000 | ORAL_TABLET | ORAL | 0 refills | Status: DC | PRN

## 2023-02-08 NOTE — Telephone Encounter (Signed)
Dr. Sanjuan Dame pt - pt lvm stating that he called central scheduling and they would not allow him to schedule because they do not have the order yet.

## 2023-02-08 NOTE — Patient Instructions (Signed)
While we are working on your approval for MRI please go ahead and call to schedule your appointment with [facility] within at least one (1) week.   [facility] [phone]   

## 2023-02-08 NOTE — Progress Notes (Signed)
My back is worse.  He has had more lower back pain.  He has little relief even with the pain medicine.  He has no new trauma, no weakness.  Lower back is tender, ROM is limited secondary to pain, NV intact, SLR positive at 30 right, Gait is slow, muscle tone and strength normal.  X-rays were done of the lumbar spine, reported separately.  Encounter Diagnosis  Name Primary?   Lumbar pain Yes   To get MRI.  I have reviewed the [place] Controlled Substance Reporting System web site prior to prescribing narcotic medicine for this patient.  Return in two weeks.  Call if any problem.  Precautions discussed.  Electronically Signed Darreld Mclean, MD 6/4/20242:35 PM

## 2023-02-09 NOTE — Telephone Encounter (Signed)
I asked him to call today I sent order yesterday about 5

## 2023-02-09 NOTE — Telephone Encounter (Signed)
I faxed yesterday he can call now I will call him in a bit and let him know

## 2023-02-11 ENCOUNTER — Telehealth: Payer: Self-pay | Admitting: Orthopaedic Surgery

## 2023-02-11 NOTE — Telephone Encounter (Signed)
Dr. Sanjuan Dame patient [name] w/[facility] [phone] ext [phone] lvm stating that she tried to get the patient's MRI approved through [employer] (???), but they are requesting further clinicals.  She stated to call them and provide them w/what they need. The patient is scheduled for Monday, 6/10.

## 2023-02-14 ENCOUNTER — Telehealth: Payer: Self-pay | Admitting: Orthopaedic Surgery

## 2023-02-14 NOTE — Telephone Encounter (Signed)
Dr. Sanjuan Dame pt - spoke w/the patient, he stated that he had to cancel his MRI for today because [facility] is stating that they need prior authorization.  He would like a call back when this has been resolved so that he can call them back to reschedule his MRI.  The patient is also requesting a refill on his Oxycodone 10-325 to be [facility] [place].

## 2023-02-15 MED ORDER — OXYCODONE-ACETAMINOPHEN 10-325 MG PO TABS: 1.0000 | ORAL_TABLET | ORAL | 0 refills | Status: DC | PRN

## 2023-02-16 ENCOUNTER — Telehealth: Payer: Self-pay | Admitting: Podiatry

## 2023-02-16 NOTE — Telephone Encounter (Signed)
"  I'm calling about a circulation test Dr. Logan Bores has set me up with.  I haven't received any information about where to go or what time the test should be.  Call me back."

## 2023-02-22 ENCOUNTER — Telehealth: Payer: Self-pay | Admitting: Orthopaedic Surgery

## 2023-02-22 MED ORDER — OXYCODONE-ACETAMINOPHEN 10-325 MG PO TABS: 1.0000 | ORAL_TABLET | ORAL | 0 refills | Status: DC | PRN

## 2023-02-22 NOTE — Telephone Encounter (Signed)
[name] Patient is in the office wanting to know about his refill for his pain medicine and also about the referral for his MRI to be done at [facility] in [place].  They told the patient they are waiting on the authorization from the insurance company before he can have it done   Please call the patient back at [phone]

## 2023-02-22 NOTE — Telephone Encounter (Signed)
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

## 2023-02-22 NOTE — Telephone Encounter (Signed)
Toniann Fail submitted it and it is under review/ Medicaid I advised him it may take a while longer but is being reviewed.

## 2023-03-01 ENCOUNTER — Telehealth: Payer: Self-pay

## 2023-03-01 ENCOUNTER — Telehealth: Payer: Self-pay | Admitting: Orthopaedic Surgery

## 2023-03-01 ENCOUNTER — Ambulatory Visit (INDEPENDENT_AMBULATORY_CARE_PROVIDER_SITE_OTHER): Payer: Medicaid Other

## 2023-03-01 ENCOUNTER — Telehealth: Payer: Self-pay | Admitting: Nurse Practitioner

## 2023-03-01 VITALS — BP 128/80 | Ht 68.0 in | Wt 320.0 lb

## 2023-03-01 DIAGNOSIS — Z794 Long term (current) use of insulin: Secondary | ICD-10-CM | POA: Diagnosis not present

## 2023-03-01 DIAGNOSIS — E1165 Type 2 diabetes mellitus with hyperglycemia: Secondary | ICD-10-CM

## 2023-03-01 MED ORDER — NOVOLOG FLEXPEN 100 UNIT/ML ~~LOC~~ SOPN: 10.0000 [IU] | PEN_INJECTOR | Freq: Three times a day (TID) | SUBCUTANEOUS | 0 refills | Status: DC

## 2023-03-01 NOTE — Telephone Encounter (Signed)
Pt is asking for a refill on his Novolog to [facility] [place]

## 2023-03-01 NOTE — Telephone Encounter (Signed)
Spoke to patient and let him know we was working on the approval  wendy send additional information that was requested for approval

## 2023-03-01 NOTE — Telephone Encounter (Signed)
[name] said [facility] [phone]  needs more information they can't do his MRI until they get more information.   Please call the patient back [phone]

## 2023-03-01 NOTE — Telephone Encounter (Signed)
Oxycodone-Acetaminophen 10/325 MG   Qty 40 Tablets  [name]

## 2023-03-01 NOTE — Telephone Encounter (Signed)
Rx sent 

## 2023-03-01 NOTE — Progress Notes (Signed)
Pt brought in his new Dexcom G7. Went over application/usage instructions. Pt voiced understanding. Sensor placed on pts L arm. He was told to call with any questions/concerns.

## 2023-03-02 ENCOUNTER — Ambulatory Visit: Payer: Medicaid Other | Admitting: Podiatry

## 2023-03-02 MED ORDER — OXYCODONE-ACETAMINOPHEN 10-325 MG PO TABS: 1.0000 | ORAL_TABLET | ORAL | 0 refills | Status: DC | PRN

## 2023-03-02 NOTE — Telephone Encounter (Signed)
Med refilled.

## 2023-03-08 ENCOUNTER — Telehealth: Payer: Self-pay

## 2023-03-08 ENCOUNTER — Telehealth: Payer: Self-pay | Admitting: Orthopaedic Surgery

## 2023-03-08 MED ORDER — OXYCODONE-ACETAMINOPHEN 10-325 MG PO TABS: 1.0000 | ORAL_TABLET | ORAL | 0 refills | Status: DC | PRN

## 2023-03-08 NOTE — Telephone Encounter (Deleted)
I have communications thaat more information is needed for [name] MRN [id number]

## 2023-03-08 NOTE — Telephone Encounter (Signed)
Toniann Fail is working on this patient

## 2023-03-08 NOTE — Telephone Encounter (Signed)
Being worked

## 2023-03-08 NOTE — Telephone Encounter (Signed)
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

## 2023-03-15 ENCOUNTER — Telehealth: Payer: Self-pay | Admitting: Orthopaedic Surgery

## 2023-03-15 MED ORDER — OXYCODONE-ACETAMINOPHEN 10-325 MG PO TABS: 1.0000 | ORAL_TABLET | ORAL | 0 refills | Status: DC | PRN

## 2023-03-15 NOTE — Telephone Encounter (Signed)
Dr. Keeling's pt - pt lvm requesting a refill on Oxycodone 10-325 to be sent to [facility] [place]. 

## 2023-03-22 ENCOUNTER — Telehealth: Payer: Self-pay | Admitting: Orthopaedic Surgery

## 2023-03-22 MED ORDER — OXYCODONE-ACETAMINOPHEN 10-325 MG PO TABS: 1.0000 | ORAL_TABLET | ORAL | 0 refills | Status: DC | PRN

## 2023-03-22 NOTE — Telephone Encounter (Signed)
Dr. Brooke Bonito pt - pt lvm requesting a refill on his Oxycodone 10-325 to be sent to [facility].

## 2023-03-23 ENCOUNTER — Ambulatory Visit: Payer: Medicaid Other | Admitting: Podiatry

## 2023-03-29 ENCOUNTER — Telehealth: Payer: Self-pay | Admitting: Orthopaedic Surgery

## 2023-03-29 MED ORDER — OXYCODONE-ACETAMINOPHEN 10-325 MG PO TABS: 1.0000 | ORAL_TABLET | ORAL | 0 refills | Status: DC | PRN

## 2023-03-29 NOTE — Telephone Encounter (Signed)
Dr. Brooke Bonito pt - pt lvm requesting a refill on Oxycodone 10-325 to be sent to [place].

## 2023-04-05 ENCOUNTER — Telehealth: Payer: Self-pay | Admitting: Orthopaedic Surgery

## 2023-04-05 MED ORDER — OXYCODONE-ACETAMINOPHEN 10-325 MG PO TABS: 1.0000 | ORAL_TABLET | ORAL | 0 refills | Status: DC | PRN

## 2023-04-05 NOTE — Telephone Encounter (Signed)
DR. Hilda Lias   Patient called and left voicemail wanting a refill on his pain medicine.     oxyCODONE-acetaminophen (PERCOCET) 10-325 MG tablet    [facility]  [place]

## 2023-04-12 ENCOUNTER — Telehealth: Payer: Self-pay | Admitting: Orthopaedic Surgery

## 2023-04-12 MED ORDER — OXYCODONE-ACETAMINOPHEN 10-325 MG PO TABS: 1.0000 | ORAL_TABLET | ORAL | 0 refills | Status: DC | PRN

## 2023-04-12 NOTE — Telephone Encounter (Signed)
Dr. Sanjuan [name] pt - pt lvm requesting a refill on Oxycodone 10-325 to be sent to [facility] [place].

## 2023-04-12 NOTE — Telephone Encounter (Signed)
Toniann Fail, pt presented to the office checking on the authorization for his MRI.  He would like a call back [phone].

## 2023-04-14 NOTE — Telephone Encounter (Signed)
I tried to call, someone answered and all I hear in back ground is someone talking on the phone in the background.  I asked several times, hello?  And no one answered.  I will try to call back in a bit.

## 2023-04-15 ENCOUNTER — Telehealth: Payer: Self-pay | Admitting: Orthopaedic Surgery

## 2023-04-15 NOTE — Telephone Encounter (Signed)
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  He stated that he has been waiting for quite a while.  He would like a call back at [phone].

## 2023-04-15 NOTE — Telephone Encounter (Signed)
I faxed the [facility]

## 2023-04-18 ENCOUNTER — Encounter: Payer: Self-pay | Admitting: Nurse Practitioner

## 2023-04-18 ENCOUNTER — Encounter: Payer: Self-pay | Admitting: Radiology

## 2023-04-18 ENCOUNTER — Ambulatory Visit (INDEPENDENT_AMBULATORY_CARE_PROVIDER_SITE_OTHER): Payer: Medicaid Other | Admitting: Nurse Practitioner

## 2023-04-18 VITALS — BP 143/88 | HR 97 | Ht 68.0 in | Wt 316.4 lb

## 2023-04-18 DIAGNOSIS — Z7984 Long term (current) use of oral hypoglycemic drugs: Secondary | ICD-10-CM | POA: Diagnosis not present

## 2023-04-18 DIAGNOSIS — E782 Mixed hyperlipidemia: Secondary | ICD-10-CM

## 2023-04-18 DIAGNOSIS — Z794 Long term (current) use of insulin: Secondary | ICD-10-CM | POA: Diagnosis not present

## 2023-04-18 DIAGNOSIS — I10 Essential (primary) hypertension: Secondary | ICD-10-CM | POA: Diagnosis not present

## 2023-04-18 DIAGNOSIS — Z91199 Patient's noncompliance with other medical treatment and regimen due to unspecified reason: Secondary | ICD-10-CM

## 2023-04-18 DIAGNOSIS — Z7985 Long-term (current) use of injectable non-insulin antidiabetic drugs: Secondary | ICD-10-CM

## 2023-04-18 DIAGNOSIS — E1165 Type 2 diabetes mellitus with hyperglycemia: Secondary | ICD-10-CM | POA: Diagnosis not present

## 2023-04-18 LAB — POCT GLYCOSYLATED HEMOGLOBIN (HGB A1C): Hemoglobin A1C: 7.8 % — AB (ref 4.0–5.6)

## 2023-04-18 MED ORDER — TRUE METRIX BLOOD GLUCOSE TEST VI STRP: ORAL_STRIP | 12 refills | Status: DC

## 2023-04-18 NOTE — Telephone Encounter (Signed)
I called to advise I have resubmitted with a telephone call note updating on patient symptoms now that 6 weeks have passed, hoping this will be approved now.  LMVM advising.

## 2023-04-18 NOTE — Progress Notes (Signed)
Patient called the office, says his pain is still there, and that he needs to get the MRI done.  He has tried walking and HEP to improve symptoms.  His pain has gradually increased, pain medication is now helping less.  Patient is taking oxycodone 10-325mg , flexeril 10mg , naproxen 500mg - all without relief in LBP and LE pain.  Will submit for MRI auth again.

## 2023-04-18 NOTE — Progress Notes (Signed)
04/18/2023, 3:45 PM      Endocrinology Follow Up Visit  Subjective:    Patient ID: [name], male    DOB: [birth year].  [name] is being seen in follow up for management of currently uncontrolled symptomatic diabetes requested by  Mirna Mires, MD.   Past Medical History:  Diagnosis Date   Arthritis    Diabetes mellitus without complication [facility])    diagnosed about age 57   Gout    Hypertension    boarderline    Past Surgical History:  Procedure Laterality Date   HERNIA REPAIR  2008   MASS EXCISION Left age 37   L thigh- benign    Social History   Socioeconomic History   Marital status: Single    Spouse name: Not on file   Number of children: Not on file   Years of education: Not on file   Highest education level: Not on file  Occupational History   Not on file  Tobacco Use   Smoking status: Former    Current packs/day: 0.00    Average packs/day: 0.5 packs/day for 5.0 years (2.5 ttl pk-yrs)    Types: Cigarettes    Start date: 26    Quit date: 50    Years since quitting: 34.6   Smokeless tobacco: Never  Vaping Use   Vaping status: Never Used  Substance and Sexual Activity   Alcohol use: No   Drug use: No   Sexual activity: Not on file  Other Topics Concern   Not on file  Social History Narrative   Not on file   Social Determinants of Health   Financial Resource Strain: Not on file  Food Insecurity: Not on file  Transportation Needs: Not on file  Physical Activity: Not on file  Stress: Not on file  Social Connections: Unknown (02/09/2023)   Received from [facility]   Social Network    Social Network: Not on file    Family History  Problem Relation Age of Onset   Cancer Mother    Diabetes Mother    Heart failure Father    Hypertension Father    Diabetes Father    Heart attack Father     Outpatient Encounter Medications as of 04/18/2023  Medication Sig    allopurinol (ZYLOPRIM) 300 MG tablet Take 1 tablet (300 mg total) by mouth daily. (Patient taking differently: Take 300 mg by mouth daily as needed.)   amLODipine (NORVASC) 10 MG tablet TAKE 1 Tablet BY MOUTH ONCE EVERY DAY (Patient taking differently: Take 10 mg by mouth daily.)   atorvastatin (LIPITOR) 80 MG tablet TAKE 1 Tablet BY MOUTH ONCE EVERY DAY (Patient taking differently: Take 80 mg by mouth daily.)   cyclobenzaprine (FLEXERIL) 10 MG tablet Take 1 tablet (10 mg total) by mouth 3 (three) times daily as needed for muscle spasms.   Dulaglutide (TRULICITY) 4.5 MG/0.5ML SOPN Inject 4.5 mg as directed once a week.   furosemide (LASIX) 20 MG tablet Take 1 tablet (20 mg total) by mouth daily.   gabapentin (NEURONTIN) 100 MG capsule Take 1 capsule (100 mg total) by mouth 3 (three) times daily.   glucose blood (TRUE METRIX BLOOD GLUCOSE TEST) test  strip Use as instructed to monitor glucose 4 times daily   insulin aspart (NOVOLOG FLEXPEN) 100 UNIT/ML FlexPen Inject 10-16 Units into the skin 3 (three) times daily with meals.   insulin glargine (LANTUS) 100 UNIT/ML injection Inject 0.8 mLs (80 Units total) into the skin daily. (Patient taking differently: Inject 40 Units into the skin daily.)   metFORMIN (GLUCOPHAGE-XR) 500 MG 24 hr tablet Take 2 tablets (1,000 mg total) by mouth daily with breakfast.   metoCLOPramide (REGLAN) 10 MG tablet Take 1 tablet (10 mg total) by mouth every 8 (eight) hours as needed for nausea.   metoprolol tartrate (LOPRESSOR) 100 MG tablet TAKE 1 Tablet  BY MOUTH TWICE DAILY   naproxen (NAPROSYN) 500 MG tablet Take 1 tablet (500 mg total) by mouth 2 (two) times daily.   NEEDLE, DISP, 18 G 18G X 1-1/2" MISC Use to draw up testosterone for injection   nystatin cream (MYCOSTATIN) Apply 1 application. topically 2 (two) times daily. To penile irritation.   oxyCODONE-acetaminophen (PERCOCET) 10-325 MG tablet Take 1 tablet by mouth every 4 (four) hours as needed for pain.    sildenafil (VIAGRA) 100 MG tablet Take 1 tablet (100 mg total) by mouth daily as needed for erectile dysfunction.   SYRINGE-NEEDLE, DISP, 3 ML 22G X 1" 3 ML MISC Use to inject testosterone   testosterone cypionate (DEPOTESTOSTERONE CYPIONATE) 200 MG/ML injection Inject 0.5 mLs (100 mg total) into the muscle every 7 (seven) days.   [DISCONTINUED] glipiZIDE (GLUCOTROL XL) 5 MG 24 hr tablet Take 1 tablet (5 mg total) by mouth daily with breakfast.   [DISCONTINUED] glucose blood test strip Use as instructed to monitor glucose 4 times daily   Continuous Glucose Sensor (DEXCOM G7 SENSOR) MISC Inject 1 Application into the skin as directed. Change sensor every 10 days as directed. (Patient not taking: Reported on 04/18/2023)   doxycycline (VIBRA-TABS) 100 MG tablet Take 100 mg by mouth 2 (two) times daily. (Patient not taking: Reported on 04/18/2023)   lisinopril (ZESTRIL) 20 MG tablet TAKE 1 Tablet BY MOUTH ONCE EVERY DAY (Patient taking differently: Take 20 mg by mouth daily.)   No facility-administered encounter medications on file as of 04/18/2023.    ALLERGIES: Allergies  Allergen Reactions   Wound Dressing Adhesive     Hives, itching- noted with the G 7 Dexcom Sensor    VACCINATION STATUS: Immunization History  Administered Date(s) Administered   Influenza,inj,Quad PF,6+ Mos 06/30/2017   Moderna Sars-Covid-2 Vaccination 04/01/2020, 04/29/2020    Diabetes He presents for his follow-up diabetic visit. He has type 2 diabetes mellitus. Onset time: He was diagnosed at approximate age of 49 years. His disease course has been improving. Pertinent negatives for hypoglycemia include no confusion, headaches, pallor, seizures, sweats or tremors. Associated symptoms include fatigue and weight loss. Pertinent negatives for diabetes include no blurred vision, no chest pain, no polydipsia, no polyphagia, no polyuria and no weakness. There are no hypoglycemic complications. Symptoms are improving. There are  no diabetic complications. Risk factors for coronary artery disease include dyslipidemia, diabetes mellitus, hypertension, male sex, obesity and sedentary lifestyle. Current diabetic treatment includes intensive insulin program and oral agent (dual therapy) (and Trulicity). He is compliant with treatment most of the time. His weight is decreasing steadily. He is following a generally unhealthy diet. When asked about meal planning, he reported none. He has not had a previous visit with a dietitian. He never participates in exercise. His home blood glucose trend is decreasing steadily. His overall blood glucose  range is 140-180 mg/dl. (He presents today with his meter, and CGM showing mostly at target glycemic profile.  His POCT A1c today is 7.8%, essentially unchanged from previous visit of 7.9%.  Analysis of his CGM shows TIR 73%, TAR 27%, TBR 0% with a GMI of 7.3%.  He notes he stopped using his CGM as he broke out in a rash, starting at the sensor and then spreading to the rest of his arm and back. He did note hypoglycemic event in the 50s on 2 separate occasions since last visit.) An ACE inhibitor/angiotensin II receptor blocker is being taken. He does not see a podiatrist.Eye exam is current.  Hyperlipidemia This is a chronic problem. The current episode started more than 1 year ago. The problem is uncontrolled. Recent lipid tests were reviewed and are high. Exacerbating diseases include diabetes and obesity. Factors aggravating his hyperlipidemia include beta blockers and fatty foods. Pertinent negatives include no chest pain, myalgias or shortness of breath. Current antihyperlipidemic treatment includes statins. The current treatment provides moderate improvement of lipids. Compliance problems include adherence to diet, adherence to exercise and medication cost.  Risk factors for coronary artery disease include dyslipidemia, diabetes mellitus, family history, hypertension, male sex, obesity and a sedentary  lifestyle.  Hypertension This is a chronic problem. The current episode started more than 1 year ago. The problem has been resolved since onset. The problem is controlled. Pertinent negatives include no blurred vision, chest pain, headaches, malaise/fatigue, neck pain, palpitations, shortness of breath or sweats. There are no associated agents to hypertension. Risk factors for coronary artery disease include dyslipidemia, diabetes mellitus, male gender, obesity and sedentary lifestyle. Past treatments include ACE inhibitors, beta blockers, calcium channel blockers and diuretics. The current treatment provides mild improvement. Compliance problems include medication cost, diet and exercise.     Review of systems  Constitutional: + decreasing body weight,  current Body mass index is 48.11 kg/m., + fatigue, no subjective hyperthermia, no subjective hypothermia Eyes: + blurry vision, no xerophthalmia ENT: no sore throat, no nodules palpated in throat, no dysphagia/odynophagia, no hoarseness Cardiovascular: no chest pain, no shortness of breath, no palpitations, no leg swelling Respiratory: no cough, no shortness of breath Gastrointestinal: no nausea/vomiting/diarrhea Musculoskeletal: + generalized muscle/joint aches Skin: no rashes, no hyperemia Neurological: no tremors, no numbness, no tingling, no dizziness Psychiatric: no depression, no anxiety    Objective:    BP (!) 143/88 (BP Location: Left Arm, Patient Position: Sitting, Cuff Size: Large)   Pulse 97   Ht 5\' 8"  (1.727 m)   Wt (!) 316 lb 6.4 oz (143.5 kg)   BMI 48.11 kg/m   Wt Readings from Last 3 Encounters:  04/18/23 (!) 316 lb 6.4 oz (143.5 kg)  03/01/23 (!) 320 lb (145.2 kg)  02/08/23 (!) 320 lb (145.2 kg)    BP Readings from Last 3 Encounters:  04/18/23 (!) 143/88  03/01/23 128/80  02/08/23 (!) 202/123    Physical Exam- Limited  Constitutional:  Body mass index is 48.11 kg/m. , not in acute distress, normal state of  mind Eyes:  EOMI, no exophthalmos Neck: Supple Musculoskeletal: no gross deformities, strength intact in all four extremities, no gross restriction of joint movements Skin:  no rashes, no hyperemia Neurological: no tremor with outstretched hands   CMP ( most recent) CMP     Component Value Date/Time   NA 137 11/10/2022 1833   K 4.2 11/10/2022 1833   CL 99 11/10/2022 1833   CO2 29 11/10/2022 1819  GLUCOSE 131 (H) 11/10/2022 1833   BUN 8 11/10/2022 1833   CREATININE 0.70 11/10/2022 1833   CREATININE 1.42 (H) 09/08/2017 1540   CALCIUM 8.6 (L) 11/10/2022 1819   PROT 7.6 11/10/2022 1819   ALBUMIN 3.3 (L) 11/10/2022 1819   AST 14 (L) 11/10/2022 1819   ALT 16 11/10/2022 1819   ALKPHOS 102 11/10/2022 1819   BILITOT 0.1 (L) 11/10/2022 1819   GFRNONAA >60 11/10/2022 1819   GFRAA >60 03/17/2020 1200     Diabetic Labs (most recent): Lab Results  Component Value Date   HGBA1C 7.8 (A) 04/18/2023   HGBA1C 7.9 (A) 01/12/2023   HGBA1C 9.1 (A) 10/14/2022   MICROALBUR 7.2 (H) 07/19/2022   MICROALBUR <3.0 (H) 07/07/2021   MICROALBUR 80 02/26/2021     Lipid Panel ( most recent) Lipid Panel     Component Value Date/Time   CHOL 263 (H) 07/19/2022 1337   TRIG 77 07/19/2022 1337   HDL 62 07/19/2022 1337   CHOLHDL 4.2 07/19/2022 1337   VLDL 15 07/19/2022 1337   LDLCALC 186 (H) 07/19/2022 1337      Lab Results  Component Value Date   TSH 0.532 06/28/2017       Assessment & Plan:   1) Uncontrolled type 2 diabetes mellitus with hyperglycemia (HCC)  - [name] has currently uncontrolled symptomatic type 2 DM since  57 years of age.  He presents today with his meter, and CGM showing mostly at target glycemic profile.  His POCT A1c today is 7.8%, essentially unchanged from previous visit of 7.9%.  Analysis of his CGM shows TIR 73%, TAR 27%, TBR 0% with a GMI of 7.3%.  He notes he stopped using his CGM as he broke out in a rash, starting at the sensor and then spreading to the  rest of his arm and back. He did note hypoglycemic event in the 50s on 2 separate occasions since last visit.  -Recent labs reviewed.  - I had a long discussion with him about the progressive nature of diabetes and the pathology behind its complications. -his diabetes is complicated by obesity/sedentary life, inadequate insurance coverage,  And he remains at a high risk for more acute and chronic complications which include CAD, CVA, CKD, retinopathy, and neuropathy. These are all discussed in detail with him.  - Nutritional counseling repeated at each appointment due to patients tendency to fall back in to old habits.  - The patient admits there is a room for improvement in their diet and drink choices. -  Suggestion is made for the patient to avoid simple carbohydrates from their diet including Cakes, Sweet Desserts / Pastries, Ice Cream, Soda (diet and regular), Sweet Tea, Candies, Chips, Cookies, Sweet Pastries, Store Bought Juices, Alcohol in Excess of 1-2 drinks a day, Artificial Sweeteners, Coffee Creamer, and "Sugar-free" Products. This will help patient to have stable blood glucose profile and potentially avoid unintended weight gain.   - I encouraged the patient to switch to unprocessed or minimally processed complex starch and increased protein intake (animal or plant source), fruits, and vegetables.   - Patient is advised to stick to a routine mealtimes to eat 3 meals a day and avoid unnecessary snacks (to snack only to correct hypoglycemia).  - he will be scheduled with Norm Salt, RDN, CDE for diabetes education.  - I have approached him with the following individualized plan to manage  his diabetes and patient agrees:   -He is advised to continue Lantus 40 units  SQ nightly, continue Novolog 10-16 units TID with meals if glucose is above 90 and he is eating (Specific instructions on how to titrate insulin dosage based on glucose readings given to patient in writing), continue  Metformin 1000 mg ER PO daily with breakfast, and Trulicity 4.5 mg SQ weekly.  I did stop his Glipizide today given his hypoglycemia.[name]   He is encouraged consistently monitor blood glucose 4 times per day, before meals and at bedtime and report to the clinic if readings are less than 70 or greater than 300 for 3 tests in a row.  He can benefit from CGM device given his MDI and improving glycemic profile.  This will be important in preventing hypoglycemia.  I recommended he try using Flonase and skin tac wipes to help put barrier between him and the adhesive, which may help prevent allergic reaction.  - Specific targets for  A1c;  LDL, HDL,  and Triglycerides were discussed with the patient.  2) Blood Pressure /Hypertension:   His blood pressure is controlled to target.  He is advised to continue Norvasc 5 mg po daily, Lasix 20 mg po daily, Lisinopril 20 mg po daily, and Metoprolol 100 mg po twice daily.    3) Lipids/Hyperlipidemia:   His recent lipid panel from 07/19/22 shows uncontrolled LDL of 186.  He is advised to continue Atorvastatin 40 mg po daily at bedtime.  Side effects and precautions discussed with him.  He has annual physical coming up with PCP in September, I asked he have any labs sent here for our records as well.  4)  Weight/Diet:  His Body mass index is 48.11 kg/m.-   clearly complicating his diabetes care.   he is  a candidate for modest weight loss. I discussed with him the fact that loss of 5 - 10% of his  current body weight will have the most impact on his diabetes management.  Exercise, and detailed carbohydrates information provided  -  detailed on discharge instructions.  He is a good candidate for bariatric surgery, however at this point he has no insurance.  5) Chronic Care/Health Maintenance: -he is on ACEI/ARB and Statin medications and is encouraged to initiate and continue to follow up with Ophthalmology, Dentist,  Podiatrist at least yearly or according to  recommendations, and advised to stay away from smoking. I have recommended yearly flu vaccine and pneumonia vaccine at least every 5 years; moderate intensity exercise for up to 150 minutes weekly; and  sleep for at least 7 hours a day.  - he is advised to maintain close follow up with Mirna Mires, MD for primary care needs, as well as his other providers for optimal and coordinated care.      I spent  41  minutes in the care of the patient today including review of labs from CMP, Lipids, Thyroid Function, Hematology (current and previous including abstractions from other facilities); face-to-face time discussing  his blood glucose readings/logs, discussing hypoglycemia and hyperglycemia episodes and symptoms, medications doses, his options of short and long term treatment based on the latest standards of care / guidelines;  discussion about incorporating lifestyle medicine;  and documenting the encounter. Risk reduction counseling performed per USPSTF guidelines to reduce obesity and cardiovascular risk factors.     Please refer to Patient Instructions for Blood Glucose Monitoring and Insulin/Medications Dosing Guide"  in media tab for additional information. Please  also refer to " Patient Self Inventory" in the Media  tab for reviewed elements of  pertinent patient history.  [name] participated in the discussions, expressed understanding, and voiced agreement with the above plans.  All questions were answered to his satisfaction. he is encouraged to contact clinic should he have any questions or concerns prior to his return visit.   Follow up plan: - Return in about 3 months (around 07/19/2023) for Diabetes F/U with A1c in office, No previsit labs, Bring meter and logs.  Ronny Bacon, [facility] [address] [place], [place] [address] Phone: [phone] Fax: [phone]  04/18/2023, 3:45 PM

## 2023-04-19 ENCOUNTER — Telehealth: Payer: Self-pay | Admitting: Orthopaedic Surgery

## 2023-04-19 MED ORDER — OXYCODONE-ACETAMINOPHEN 10-325 MG PO TABS: 1.0000 | ORAL_TABLET | ORAL | 0 refills | Status: DC | PRN

## 2023-04-19 NOTE — Telephone Encounter (Signed)
Dr. Sanjuan Dame pt - pt lvm requesting a refill on Oxycodone 10-325 to be sent to [facility] [place].  He stated that insurance is giving a them a hard time and to avoid a prior autho to please call in 30 and not 40.

## 2023-04-20 ENCOUNTER — Ambulatory Visit: Payer: Medicaid Other | Admitting: Orthopaedic Surgery

## 2023-04-20 ENCOUNTER — Ambulatory Visit: Payer: Medicaid Other | Admitting: Podiatry

## 2023-04-25 ENCOUNTER — Encounter: Payer: Self-pay | Admitting: Podiatry

## 2023-04-25 ENCOUNTER — Ambulatory Visit: Payer: Medicaid Other | Admitting: Podiatry

## 2023-04-25 ENCOUNTER — Ambulatory Visit (INDEPENDENT_AMBULATORY_CARE_PROVIDER_SITE_OTHER): Payer: Medicaid Other

## 2023-04-25 DIAGNOSIS — B351 Tinea unguium: Secondary | ICD-10-CM

## 2023-04-25 DIAGNOSIS — E0843 Diabetes mellitus due to underlying condition with diabetic autonomic (poly)neuropathy: Secondary | ICD-10-CM

## 2023-04-25 DIAGNOSIS — E230 Hypopituitarism: Secondary | ICD-10-CM

## 2023-04-25 MED ORDER — GABAPENTIN 100 MG PO CAPS: 100.0000 mg | ORAL_CAPSULE | Freq: Three times a day (TID) | ORAL | 3 refills | Status: DC

## 2023-04-25 NOTE — Progress Notes (Signed)
   Chief Complaint  Patient presents with   Diabetes    Rm 7: Patient is here for a follow up for chronic conditions for [facility]     Subjective:  57 y.o. male with PMHx of diabetes mellitus presenting today for routine footcare.  Patient believes that the wound to the plantar aspect of the foot has healed.  His feet in general are very sensitive.  He states that he is going next week to a pedicure salon where they will soak his foot and file his nails and calluses.   Past Medical History:  Diagnosis Date   Arthritis    Diabetes mellitus without complication (HCC)    diagnosed about age 26   Gout    Hypertension    boarderline    Past Surgical History:  Procedure Laterality Date   HERNIA REPAIR  2008   MASS EXCISION Left age 41   L thigh- benign    Allergies  Allergen Reactions   Wound Dressing Adhesive     Hives, itching- noted with the G 7 Dexcom Sensor      RT foot 02/02/2023  Objective/Physical Exam General: The patient is alert and oriented x3 in no acute distress.  Dermatology:  The wound to the plantar aspect of the first MTP of the right foot has healed completely.  Complete reepithelialization has occurred.  There is some hyperkeratotic callus tissue noted to the first MTP bilateral.  No open wounds however. Hyperkeratotic dystrophic elongated thickened nails noted 1-5 bilateral with hypersensitivity to touch  Vascular: Skin is warm to touch.  Capillary refill WNL.  However the patient does experience rest pain especially in the toes concerning for possible ischemia  Neurological: Light touch and protective threshold diminished bilaterally.   Musculoskeletal Exam: Range of motion within normal limits to all pedal and ankle joints bilateral. Muscle strength 5/5 in all groups bilateral.   Assessment: 1.  Ulcer plantar aspect of the first MTP right secondary to diabetes mellitus 2. diabetes mellitus w/ peripheral neuropathy 3.  Pain due to onychomycosis of  toenails both  Plan of Care:  -Patient evaluated -Patient declined any nail debridement or callus debridement today.  He states that he is going to a Land. -Continue wearing good supportive shoes and sneakers -Refill prescription for gabapentin 100 mg 3 times daily -Return to clinic 3 months routine footcare   Felecia Shelling, DPM [facility]  Dr. Felecia Shelling, DPM    [address] [place], [place] [address]                Office [phone]  Fax [phone]

## 2023-04-25 NOTE — Patient Instructions (Signed)

## 2023-04-25 NOTE — Progress Notes (Signed)
Patient presents today for IM injection teaching.  Patient instructed on how to give testosterone injection.  Patient supplied rx, he injected 0.5 Ml (100mg ) of testosterone into left leg.  No complications were noted.  Patient will call if he needs further instruction for a NV apt.

## 2023-04-26 ENCOUNTER — Ambulatory Visit: Payer: Medicaid Other

## 2023-04-26 ENCOUNTER — Other Ambulatory Visit: Payer: Self-pay

## 2023-04-26 MED ORDER — OXYCODONE-ACETAMINOPHEN 10-325 MG PO TABS: 1.0000 | ORAL_TABLET | ORAL | 0 refills | Status: DC | PRN

## 2023-04-26 NOTE — Telephone Encounter (Signed)
Dr. Hilda Lias pt-----Oxycodone-Acetaminophen 10/325 MG   Qty 30 Tablets  Take 1 tablet by mouth every 4 (four) hours as needed for pain.   PATIENT USES [name]

## 2023-04-27 ENCOUNTER — Other Ambulatory Visit: Payer: Self-pay | Admitting: Orthopaedic Surgery

## 2023-04-27 DIAGNOSIS — M545 Low back pain, unspecified: Secondary | ICD-10-CM

## 2023-04-27 NOTE — Progress Notes (Signed)
MRI denied second time, will try OP PT.  Order placed, f/u made with Dr Hilda Lias on 05/10/23.

## 2023-05-03 ENCOUNTER — Telehealth: Payer: Self-pay | Admitting: Orthopaedic Surgery

## 2023-05-03 MED ORDER — OXYCODONE-ACETAMINOPHEN 10-325 MG PO TABS: 1.0000 | ORAL_TABLET | ORAL | 0 refills | Status: DC | PRN

## 2023-05-03 NOTE — Telephone Encounter (Signed)
 Dr. Sanjuan Dame pt - pt lvm requesting a refill on Oxycodone 10-325 to be sent to [facility].

## 2023-05-04 ENCOUNTER — Ambulatory Visit (HOSPITAL_COMMUNITY): Payer: Medicaid Other

## 2023-05-10 ENCOUNTER — Ambulatory Visit: Payer: Medicaid Other | Admitting: Orthopaedic Surgery

## 2023-05-10 ENCOUNTER — Telehealth: Payer: Self-pay | Admitting: Orthopaedic Surgery

## 2023-05-10 ENCOUNTER — Encounter: Payer: Self-pay | Admitting: Orthopaedic Surgery

## 2023-05-10 VITALS — BP 149/89 | HR 89 | Ht 68.0 in | Wt 320.0 lb

## 2023-05-10 DIAGNOSIS — Z6841 Body Mass Index (BMI) 40.0 and over, adult: Secondary | ICD-10-CM | POA: Diagnosis not present

## 2023-05-10 DIAGNOSIS — M545 Low back pain, unspecified: Secondary | ICD-10-CM

## 2023-05-10 MED ORDER — OXYCODONE-ACETAMINOPHEN 10-325 MG PO TABS: 1.0000 | ORAL_TABLET | ORAL | 0 refills | Status: DC | PRN

## 2023-05-10 NOTE — Telephone Encounter (Signed)
 Dr. Sanjuan Dame pt - pt lvm requesting a refill on Oxycodone 10-325 to be sent to [facility].

## 2023-05-10 NOTE — Progress Notes (Signed)
My back hurts more.  His MRI was denied again.  The insurance company wants PT first.  PT is arranged.  He has chronic lower back pain, no new trauma.  ROM of the back is limited secondary to pain, NV intact, SLR weakly positive bilaterally at 30 degrees, gait slow, no spasm, muscle tone and strength normal.  Encounter Diagnoses  Name Primary?   Lumbar pain Yes   Morbid obesity due to excess calories (HCC)    Body mass index 45.0-49.9, adult (HCC)    I have reviewed the [place] Controlled Substance Reporting System web site prior to prescribing narcotic medicine for this patient.  Call if any problem.  Precautions discussed.  Begin PT.  Electronically Signed Darreld Mclean, MD 9/3/20242:17 PM

## 2023-05-17 ENCOUNTER — Ambulatory Visit (HOSPITAL_COMMUNITY): Payer: Medicaid Other | Attending: Orthopaedic Surgery

## 2023-05-17 ENCOUNTER — Other Ambulatory Visit: Payer: Self-pay

## 2023-05-17 ENCOUNTER — Telehealth: Payer: Self-pay | Admitting: Orthopaedic Surgery

## 2023-05-17 DIAGNOSIS — R29898 Other symptoms and signs involving the musculoskeletal system: Secondary | ICD-10-CM | POA: Diagnosis present

## 2023-05-17 DIAGNOSIS — M545 Low back pain, unspecified: Secondary | ICD-10-CM | POA: Diagnosis present

## 2023-05-17 MED ORDER — OXYCODONE-ACETAMINOPHEN 10-325 MG PO TABS: 1.0000 | ORAL_TABLET | ORAL | 0 refills | Status: DC | PRN

## 2023-05-17 NOTE — Telephone Encounter (Signed)
Patient called lvm he needs his pain medicine refilled.    oxyCODONE-acaminophen (PERCOCET) 10-325 MG tablet     [facility]

## 2023-05-17 NOTE — Therapy (Signed)
OUTPATIENT PHYSICAL THERAPY THORACOLUMBAR EVALUATION   Patient Name: [name] MRN: [id number] DOB:08-20-1966, 57 y.o., male Today's Date: 05/17/2023  END OF SESSION:  PT End of Session - 05/17/23 1641     Visit Number 1    Number of Visits 8    Date for PT Re-Evaluation 06/14/23    Authorization Type [place] [employer] (requested 8 visits)    PT Start Time 1520    PT Stop Time 1600    PT Time Calculation (min) 40 min    Activity Tolerance Patient tolerated treatment well    Behavior During Therapy WFL for tasks assessed/performed             Past Medical History:  Diagnosis Date   Arthritis    Diabetes mellitus without complication (HCC)    diagnosed about age 13   Gout    Hypertension    boarderline   Past Surgical History:  Procedure Laterality Date   HERNIA REPAIR  2008   MASS EXCISION Left age 61   L thigh- benign   Patient Active Problem List   Diagnosis Date Noted   Mixed hyperlipidemia 10/23/2019   Exposure to COVID-19 virus 02/15/2019   Encounter for medication monitoring 08/03/2017   Diastolic dysfunction    History of gout    Essential hypertension, benign    Post-operative pain    Bacteremia due to Gram-positive bacteria    Uncontrolled type 2 diabetes mellitus with hyperglycemia (HCC)    Right knee pain 01/08/2016   Left knee pain 01/08/2016   Morbid obesity due to excess calories (HCC) 01/08/2016    PCP: Mirna Mires MD  REFERRING PROVIDER: Darreld Mclean, MD  REFERRING DIAG: M54.50 (ICD-10-CM) - Lumbar pain  Rationale for Evaluation and Treatment: Rehabilitation  THERAPY DIAG:  Lumbar pain  Weakness of both lower extremities  ONSET DATE: several months ago  SUBJECTIVE:                                                                                                                                                                                           SUBJECTIVE STATEMENT: Arrives to the clinic with c/o low back pain.  Condition started several months ago without apparent reason. Pain gradually got worse in time. Took some pain meds, applied cream and a heating pad which only helped a little. Patient then went to see his MD. Cathlean Sauer was done and revealed acute arthritis per patient. Patient was then referred to outpatient PT evaluation and management.  PERTINENT HISTORY:  Hx of gout  PAIN:  Are you having pain? Yes: NPRS scale: 7/10 Pain location: low back  Pain  description: localized, sharp Aggravating factors: bending from standing, laying down Relieving factors: sidelying  PRECAUTIONS: None  RED FLAGS: Bowel or bladder incontinence: No, Cauda equina syndrome: No, and Compression fracture: No   WEIGHT BEARING RESTRICTIONS: No  FALLS:  Has patient fallen in last 6 months? Yes. 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   There is loss of normal lordosis.  There is degenerative changes present of the lumbar vertebra with large anterior superior osteophyte off L4.  No fracture is noted.  Bone quality is good.   Impression: loss of lordosis, degenerative changes of lumbar spine, no acute findings  PATIENT SURVEYS:  Modified Oswestry 21/50 = 42%   SCREENING FOR RED FLAGS: Bowel or bladder incontinence: No Cauda equina syndrome: No Compression fracture: No  COGNITION: Overall cognitive status: Within functional limits for tasks assessed     MUSCLE LENGTH: Hamstrings: moderate restriction on  B Piriformis: moderate restriction on B  POSTURE: decreased lumbar lordosis (standing)  PALPATION: Grade 1 tenderness on major muscle mass of the lumbar spine  LUMBAR ROM:   AROM eval  Flexion 75%  Extension 50%  Right lateral flexion   Left lateral flexion   Right rotation 50%  Left rotation 25%   (Blank rows = not tested)  LOWER EXTREMITY ROM:     Active  Right eval Left eval  Hip flexion [facility]  Hip extension    Hip abduction [facility]  Hip adduction [facility]  Hip internal rotation    Hip external rotation    Knee flexion [facility] WFL  Knee extension [facility]  Ankle dorsiflexion [facility] WFL  Ankle plantarflexion [facility] WFL  Ankle inversion    Ankle eversion     (Blank rows = not tested)  LOWER EXTREMITY MMT:    MMT Right eval Left eval  Hip flexion 4 3+  Hip extension 3+ 3+  Hip abduction 3+ 3+  Hip adduction    Hip internal rotation    Hip external rotation    Knee flexion 4+ 4  Knee extension 4+ 3+  Ankle dorsiflexion 4+ 4+  Ankle plantarflexion 4+ 4+  Ankle inversion    Ankle eversion     (Blank rows = not tested)  LUMBAR SPECIAL TESTS:  Straight leg raise test: Positive (aggravates the low back)  FUNCTIONAL TESTS:  5 times sit to stand: 20.78 sec 2 minute walk test: 307 ft  GAIT: Distance walked: 307 ft Assistive device utilized: None Level of assistance: Complete Independence Comments: done with , decreased step length on B, decreased cadence.  TODAY'S TREATMENT:  DATE:  05/17/23 Evaluation and patient education done    PATIENT EDUCATION:  Education details: Educated on the pathoanatomy of low back pain. Educated on the goals and course of rehab. Person educated: Patient Education method: Explanation Education comprehension: verbalized understanding  HOME EXERCISE PROGRAM: None provided to  date  ASSESSMENT:  CLINICAL IMPRESSION: Patient is a 57 y.o. male who was seen today for physical therapy evaluation and treatment for lumbar pain. Patient was diagnosed with lumbar pain by referring provider further defined by bending over due to pain, weakness, and decreased soft tissue extensibility. Skilled PT is required to address the impairments and functional limitations listed below.     OBJECTIVE IMPAIRMENTS: decreased activity tolerance, decreased mobility, decreased ROM, decreased strength, impaired flexibility, postural dysfunction, and pain.   ACTIVITY LIMITATIONS: carrying, lifting, bending, sitting, standing, and squatting  PARTICIPATION LIMITATIONS: meal prep, cleaning, laundry, shopping, community activity, occupation, and yard work  PERSONAL FACTORS: Time since onset of injury/illness/exacerbation are also affecting patient's functional outcome.   REHAB POTENTIAL: Good  CLINICAL DECISION MAKING: Stable/uncomplicated  EVALUATION COMPLEXITY: Low   GOALS: Goals reviewed with patient? Yes  SHORT TERM GOALS: Target date: 05/31/23  Pt will demonstrate indep in HEP to facilitate carry-over of skilled services and improve functional outcomes Goal status: INITIAL  LONG TERM GOALS: Target date: 06/14/23  Pt will demonstrate a decrease in modified ODI score by 13-14% to demonstrate significant improvement in ADLs Baseline: 42% Goal status: INITIAL  2.  Pt will decrease 5TSTS by at least 3 seconds in order to demonstrate clinically significant improvement in LE strength   Baseline: 20.78 sec Goal status: INITIAL  3.  Pt will increase by at least 40 ft in order to demonstrate clinically significant improvement in community ambulation Baseline: 307 ft Goal status: INITIAL  4.  Pt will demonstrate increase in LE strength to 4 to facilitate ease and safety in ambulation  Baseline: 3+/5 Goal status: INITIAL  5.  Pt will demonstrate improved flexibility to  slight-mild restriction in the LEs to facilitate ease in ambulation and ADLs Baseline: moderate restriction Goal status: INITIAL  PLAN:  PT FREQUENCY: 2x/week  PT DURATION: 4 weeks  PLANNED INTERVENTIONS: Therapeutic exercises, Therapeutic activity, Neuromuscular re-education, Patient/Family education, Self Care, Joint mobilization, and Manual therapy.  PLAN FOR NEXT SESSION: Provide HEP. Begin LE flexibility, core and hip strengthening activities.   Tish Frederickson. Baird Polinski, PT, DPT, OCS Board-Certified Clinical Specialist in Orthopedic PT PT Compact Privilege # ([place]): [id number] T 05/17/2023, 4:44 PM

## 2023-05-24 ENCOUNTER — Encounter (HOSPITAL_COMMUNITY): Payer: Medicaid Other

## 2023-05-24 ENCOUNTER — Telehealth: Payer: Self-pay | Admitting: Orthopaedic Surgery

## 2023-05-24 MED ORDER — OXYCODONE-ACETAMINOPHEN 10-325 MG PO TABS: 1.0000 | ORAL_TABLET | ORAL | 0 refills | Status: DC | PRN

## 2023-05-24 NOTE — Telephone Encounter (Signed)
Dr. Sanjuan Dame pt - pt lvm requesting a refill on Oxycodone 10-325 to be sent to [facility]

## 2023-05-31 ENCOUNTER — Telehealth: Payer: Self-pay | Admitting: Orthopaedic Surgery

## 2023-05-31 MED ORDER — OXYCODONE-ACETAMINOPHEN 10-325 MG PO TABS: 1.0000 | ORAL_TABLET | ORAL | 0 refills | Status: DC | PRN

## 2023-05-31 NOTE — Telephone Encounter (Signed)
Dr. Sanjuan Dame pt - spoke w/the patient, he is requesting a refill on Oxycodone 10-325 to be sent to [facility]

## 2023-06-07 ENCOUNTER — Ambulatory Visit: Payer: Medicaid Other | Admitting: Orthopaedic Surgery

## 2023-06-07 ENCOUNTER — Telehealth: Payer: Self-pay | Admitting: Orthopaedic Surgery

## 2023-06-07 ENCOUNTER — Encounter: Payer: Self-pay | Admitting: Orthopaedic Surgery

## 2023-06-07 VITALS — BP 109/77 | HR 92 | Ht 67.0 in | Wt 318.0 lb

## 2023-06-07 DIAGNOSIS — Z6841 Body Mass Index (BMI) 40.0 and over, adult: Secondary | ICD-10-CM | POA: Diagnosis not present

## 2023-06-07 DIAGNOSIS — M545 Low back pain, unspecified: Secondary | ICD-10-CM | POA: Diagnosis not present

## 2023-06-07 MED ORDER — OXYCODONE-ACETAMINOPHEN 10-325 MG PO TABS: 1.0000 | ORAL_TABLET | ORAL | 0 refills | Status: DC | PRN

## 2023-06-07 NOTE — Progress Notes (Signed)
My back is slightly better.  He has been going to PT and has some slight improvement in his lower back pain.  He has no new trauma.  He has no weakness.  Lower back is tender, ROM is good, NV intact, muscle tone and strength normal.  Encounter Diagnoses  Name Primary?   Lumbar pain Yes   Morbid obesity due to excess calories (HCC)    Body mass index 45.0-49.9, adult (HCC)    I refilled his pain medicine yesterday.  Return in three months.  Finish up PT.  Call if any problem.  Precautions discussed.  Electronically Signed Darreld Mclean, MD 10/1/20241:35 PM

## 2023-06-07 NOTE — Telephone Encounter (Signed)
 Dr. Sanjuan Dame pt - pt lvm requesting a refill on Oxycodone 10-325 to be sent to [facility].

## 2023-06-09 ENCOUNTER — Ambulatory Visit: Payer: Medicaid Other | Admitting: Urology

## 2023-06-09 VITALS — BP 136/82 | HR 97 | Ht 67.0 in | Wt 318.0 lb

## 2023-06-09 DIAGNOSIS — Z87438 Personal history of other diseases of male genital organs: Secondary | ICD-10-CM

## 2023-06-09 DIAGNOSIS — E291 Testicular hypofunction: Secondary | ICD-10-CM

## 2023-06-09 DIAGNOSIS — E230 Hypopituitarism: Secondary | ICD-10-CM

## 2023-06-09 DIAGNOSIS — N5201 Erectile dysfunction due to arterial insufficiency: Secondary | ICD-10-CM

## 2023-06-09 DIAGNOSIS — N4883 Acquired buried penis: Secondary | ICD-10-CM

## 2023-06-09 MED ORDER — TESTOSTERONE CYPIONATE 200 MG/ML IM SOLN: 100.0000 mg | INTRAMUSCULAR | 1 refills | Status: DC

## 2023-06-09 MED ORDER — SILDENAFIL CITRATE 100 MG PO TABS: 100.0000 mg | ORAL_TABLET | Freq: Every day | ORAL | 11 refills | Status: DC | PRN

## 2023-06-09 NOTE — Progress Notes (Signed)
Subjective: 1. Hypogonadotropic hypogonadism (HCC)   2. Acquired buried penis   3. Erectile dysfunction due to arterial insufficiency     06/10/23: Kasch returns today in f/u.  He stopped the TRT with the last dose about a month ago because of some swelling in his feet.  He had no calf pain.  He hasn't had any blood work prior to this visit.  He is voiding ok with an IPSS of 7.  He continues to have issues with ED but couldn't get the Sildenafil because of cost but I don't [facility] was [facility].  He has lost another 15 or so lb's.    4/4/24Ronaldo Miyamoto returns today in f/u.  He has been losing weight on trulicity and is down 100lb.  His buried penis and phimosis have continued to improved.  He has persistent ED but didn't get the sildenafil but would still like to try that.  His testosterone was 140 in 1/24. His  PSA was 0.27.   09/09/22: Divit returns today in f/u.  He has continued to lose some weight and is having less of an issue with the buried penis.  He has stable LUTS with an IPSS of 22 but he attributes most of his symptoms to his diabetes.  UA is ok today.   He has ED and would like to try Viagra again.    5/18/23Ronaldo Miyamoto returns today in f/u.  He has lost 30lb with diet but his glucose was 335 yesterday so he is still having frequency and nocturia.  He still has 3+ glucose in the urine today.   He has been using nystatin cream and needs a refill.  He didn't get the testosterone level that was ordered before and hasn't been able to get to [facility] to see the reconstructive urologist.    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  His PSA on 07/07/21 was stable at 0.26.   He is a very poorly controlled diabetic with a glucose of 539  and Hgb A1c of >15.5 on 09/28/21.  He hasn't had a recent testosterone level.   He is concerned today about a retracted penis and having to sit to void.   He is losing weight.  He has  frequency and urgency with nocturia that is probably secondary to polydipsia from the diabetes.   UA has 3+ glucose but is otherwise clear.  ROS:  Review of Systems  All other systems reviewed and are negative.   Allergies  Allergen Reactions   Wound Dressing Adhesive     Hives, itching- noted with the G 7 Dexcom Sensor    Past Medical History:  Diagnosis Date   Arthritis    Diabetes mellitus without complication (HCC)    diagnosed about age 1   Gout    Hypertension    boarderline    Past Surgical History:  Procedure Laterality Date   HERNIA REPAIR  2008   MASS EXCISION Left age 37   L thigh- benign    Social History   Socioeconomic History   Marital status: Single    Spouse name: Not on file   Number of children: Not on file   Years of education: Not on file   Highest education level: Not on file  Occupational History   Not on file  Tobacco Use   Smoking status: Former    Current packs/day:  0.00    Average packs/day: 0.5 packs/day for 5.0 years (2.5 ttl pk-yrs)    Types: Cigarettes    Start date: 18    Quit date: 5    Years since quitting: 34.7   Smokeless tobacco: Never  Vaping Use   Vaping status: Never Used  Substance and Sexual Activity   Alcohol use: No   Drug use: No   Sexual activity: Not on file  Other Topics Concern   Not on file  Social History Narrative   Not on file   Social Determinants of Health   Financial Resource Strain: Not on file  Food Insecurity: Not on file  Transportation Needs: Not on file  Physical Activity: Not on file  Stress: Not on file  Social Connections: Unknown (02/09/2023)   Received from [facility]   Social Network    Social Network: Not on file  Intimate Partner Violence: Unknown (02/09/2023)   Received from [facility], [facility]   HITS    Physically Hurt: Not on file    Insult or Talk Down To: Not on file    Threaten Physical Harm: Not on file    Scream or Curse: Not on file     Family History  Problem Relation Age of Onset   Cancer Mother    Diabetes Mother    Heart failure Father    Hypertension Father    Diabetes Father    Heart attack Father     Anti-infectives: Anti-infectives (From admission, onward)    None       Current Outpatient Medications  Medication Sig Dispense Refill   allopurinol (ZYLOPRIM) 300 MG tablet Take 1 tablet (300 mg total) by mouth daily. (Patient taking differently: Take 300 mg by mouth daily as needed.) 30 tablet 5   amLODipine (NORVASC) 10 MG tablet TAKE 1 Tablet BY MOUTH ONCE EVERY DAY (Patient taking differently: Take 10 mg by mouth daily.) 90 tablet 0   atorvastatin (LIPITOR) 80 MG tablet TAKE 1 Tablet BY MOUTH ONCE EVERY DAY (Patient taking differently: Take 80 mg by mouth daily.) 90 tablet 0   Continuous Glucose Sensor (DEXCOM G7 SENSOR) MISC Inject 1 Application into the skin as directed. Change sensor every 10 days as directed. 9 each 3   cyclobenzaprine (FLEXERIL) 10 MG tablet Take 1 tablet (10 mg total) by mouth 3 (three) times daily as needed for muscle spasms. 30 tablet 0   doxycycline (VIBRA-TABS) 100 MG tablet Take 100 mg by mouth 2 (two) times daily.     Dulaglutide (TRULICITY) 4.5 MG/0.5ML SOPN Inject 4.5 mg as directed once a week. 3 mL 3   furosemide (LASIX) 20 MG tablet Take 1 tablet (20 mg total) by mouth daily. 30 tablet 1   gabapentin (NEURONTIN) 100 MG capsule Take 1 capsule (100 mg total) by mouth 3 (three) times daily. 270 capsule 3   glucose blood (TRUE METRIX BLOOD GLUCOSE TEST) test strip Use as instructed to monitor glucose 4 times daily 100 each 12   insulin aspart (NOVOLOG FLEXPEN) 100 UNIT/ML FlexPen Inject 10-16 Units into the skin 3 (three) times daily with meals. 45 mL 0   insulin glargine (LANTUS) 100 UNIT/ML injection Inject 0.8 mLs (80 Units total) into the skin daily. (Patient taking differently: Inject 40 Units into the skin daily.) 60 mL 3   lisinopril (ZESTRIL) 20 MG tablet TAKE 1  Tablet BY MOUTH ONCE EVERY DAY (Patient taking differently: Take 20 mg by mouth daily.) 90 tablet 0  metFORMIN (GLUCOPHAGE-XR) 500 MG 24 hr tablet Take 2 tablets (1,000 mg total) by mouth daily with breakfast. 180 tablet 3   metoCLOPramide (REGLAN) 10 MG tablet Take 1 tablet (10 mg total) by mouth every 8 (eight) hours as needed for nausea. 30 tablet 0   metoprolol tartrate (LOPRESSOR) 100 MG tablet TAKE 1 Tablet  BY MOUTH TWICE DAILY 180 tablet 0   naproxen (NAPROSYN) 500 MG tablet Take 1 tablet (500 mg total) by mouth 2 (two) times daily. 20 tablet 0   NEEDLE, DISP, 18 G 18G X 1-1/2" MISC Use to draw up testosterone for injection 2 each 12   nystatin cream (MYCOSTATIN) Apply 1 application. topically 2 (two) times daily. To penile irritation. 30 g 3   oxyCODONE-acetaminophen (PERCOCET) 10-325 MG tablet Take 1 tablet by mouth every 4 (four) hours as needed for pain. 30 tablet 0   SYRINGE-NEEDLE, DISP, 3 ML 22G X 1" 3 ML MISC Use to inject testosterone 2 each 12   sildenafil (VIAGRA) 100 MG tablet Take 1 tablet (100 mg total) by mouth daily as needed for erectile dysfunction. 30 tablet 11   testosterone cypionate (DEPOTESTOSTERONE CYPIONATE) 200 MG/ML injection Inject 0.5 mLs (100 mg total) into the muscle every 7 (seven) days. 10 mL 1   No current facility-administered medications for this visit.     Objective: Vital signs in last 24 hours: BP 136/82   Pulse 97   Ht 5\' 7"  (1.702 m)   Wt (!) 318 lb (144.2 kg)   BMI 49.81 kg/m   Intake/Output from previous day: No intake/output data recorded. Intake/Output this shift: @IOTHISSHIFT @   Physical Exam Vitals reviewed.  Constitutional:      Appearance: Normal appearance. He is obese.  Neurological:     Mental Status: He is alert.     Lab Results:  Results for orders placed or performed in visit on 06/09/23 (from the past 24 hour(s))  Urinalysis, Routine w reflex microscopic     Status: None   Collection Time: 06/09/23  3:14 PM   Result Value Ref Range   Specific Gravity, UA 1.025 1.005 - 1.030   pH, UA 5.5 5.0 - 7.5   Color, UA Yellow Yellow   Appearance Ur Clear Clear   Leukocytes,UA Negative Negative   Protein,UA Trace Negative/Trace   Glucose, UA Negative Negative   Ketones, UA Negative Negative   RBC, UA Negative Negative   Bilirubin, UA Negative Negative   Urobilinogen, Ur 1.0 0.2 - 1.0 mg/dL   Nitrite, UA Negative Negative   Microscopic Examination Comment    Narrative   Performed at:  [id number] [facility] [address] [place], [place]  [id number] Lab Director: Chinita Pester MT, Phone:  [phone]         BMET No results for input(s): "NA", "K", "CL", "CO2", "GLUCOSE", "BUN", "CREATININE", "CALCIUM" in the last 72 hours. PT/INR No results for input(s): "LABPROT", "INR" in the last 72 hours. ABG No results for input(s): "PHART", "HCO3" in the last 72 hours.  Invalid input(s): "PCO2", "PO2" Recent Results (from the past 2160 hour(s))  HgB A1c     Status: Abnormal   Collection Time: 04/18/23  3:28 PM  Result Value Ref Range   Hemoglobin A1C 7.8 (A) 4.0 - 5.6 %   HbA1c POC (<> result, manual entry)     HbA1c, POC (prediabetic range)     HbA1c, POC (controlled diabetic range)    Urinalysis, Routine w reflex microscopic     Status:  None   Collection Time: 06/09/23  3:14 PM  Result Value Ref Range   Specific Gravity, UA 1.025 1.005 - 1.030   pH, UA 5.5 5.0 - 7.5   Color, UA Yellow Yellow   Appearance Ur Clear Clear   Leukocytes,UA Negative Negative   Protein,UA Trace Negative/Trace   Glucose, UA Negative Negative   Ketones, UA Negative Negative   RBC, UA Negative Negative   Bilirubin, UA Negative Negative   Urobilinogen, Ur 1.0 0.2 - 1.0 mg/dL   Nitrite, UA Negative Negative   Microscopic Examination Comment     Comment: Microscopic not indicated and not performed.    Studies/Results: No results found.   Assessment/Plan: History of balanitis and a buried penis.  He  continues to do well with further weight loss.   Hypogonadism.  His T was 140.  I will repeat that and start him on TRT with 100mg  IM qwk.  He will return for injection training and get labs in a month and 6 months.   ED.   He would like to try sildenafil again.  Script printed.   LUTS.  His voiding symptoms are stable.  His UA is unremarkable today and has no glucose.   Meds ordered this encounter  Medications   DISCONTD: sildenafil (VIAGRA) 100 MG tablet    Sig: Take 1 tablet (100 mg total) by mouth daily as needed for erectile dysfunction.    Dispense:  10 tablet    Refill:  11   sildenafil (VIAGRA) 100 MG tablet    Sig: Take 1 tablet (100 mg total) by mouth daily as needed for erectile dysfunction.    Dispense:  30 tablet    Refill:  11   testosterone cypionate (DEPOTESTOSTERONE CYPIONATE) 200 MG/ML injection    Sig: Inject 0.5 mLs (100 mg total) into the muscle every 7 (seven) days.    Dispense:  10 mL    Refill:  1     Orders Placed This Encounter  Procedures   Urinalysis, Routine w reflex microscopic   Testosterone Free, Profile I    Standing Status:   Future    Number of Occurrences:   1    Standing Expiration Date:   06/08/2024   Hemoglobin and hematocrit, blood    Standing Status:   Future    Number of Occurrences:   1    Standing Expiration Date:   06/08/2024   Testosterone    Standing Status:   Future    Number of Occurrences:   1    Standing Expiration Date:   06/08/2024   Hemoglobin and hematocrit, blood    Standing Status:   Future    Number of Occurrences:   1    Standing Expiration Date:   06/08/2024     Return in about 6 months (around 12/08/2023) for with labs.    CC: Jacquelin Hawking PA.      Bjorn Pippin 06/10/2023 [phone]

## 2023-06-10 LAB — URINALYSIS, ROUTINE W REFLEX MICROSCOPIC
Bilirubin, UA: NEGATIVE
Glucose, UA: NEGATIVE
Ketones, UA: NEGATIVE
Leukocytes,UA: NEGATIVE
Nitrite, UA: NEGATIVE
RBC, UA: NEGATIVE
Specific Gravity, UA: 1.025 (ref 1.005–1.030)
Urobilinogen, Ur: 1 mg/dL (ref 0.2–1.0)
pH, UA: 5.5 (ref 5.0–7.5)

## 2023-06-14 ENCOUNTER — Telehealth: Payer: Self-pay | Admitting: Orthopaedic Surgery

## 2023-06-14 MED ORDER — OXYCODONE-ACETAMINOPHEN 10-325 MG PO TABS: 1.0000 | ORAL_TABLET | ORAL | 0 refills | Status: DC | PRN

## 2023-06-14 NOTE — Telephone Encounter (Signed)
Dr. Sanjuan Dame pt - pt lvm requesting a refill on his Oxycodone 10-325 to be sent to [facility] [place]

## 2023-06-17 ENCOUNTER — Encounter (HOSPITAL_COMMUNITY): Payer: Medicaid Other

## 2023-06-20 ENCOUNTER — Ambulatory Visit (HOSPITAL_COMMUNITY): Payer: Medicaid Other | Attending: Podiatry

## 2023-06-21 ENCOUNTER — Telehealth: Payer: Self-pay | Admitting: Orthopaedic Surgery

## 2023-06-21 MED ORDER — OXYCODONE-ACETAMINOPHEN 10-325 MG PO TABS: 1.0000 | ORAL_TABLET | ORAL | 0 refills | Status: DC | PRN

## 2023-06-21 NOTE — Telephone Encounter (Signed)
Dr. Sanjuan Dame pt - pt lvm requesting a refill on Oxycodone 10-325 to be sent to [facility]

## 2023-06-28 ENCOUNTER — Telehealth: Payer: Self-pay | Admitting: Orthopaedic Surgery

## 2023-06-28 MED ORDER — OXYCODONE-ACETAMINOPHEN 10-325 MG PO TABS: 1.0000 | ORAL_TABLET | ORAL | 0 refills | Status: DC | PRN

## 2023-06-28 NOTE — Telephone Encounter (Signed)
Dr. Sanjuan Dame pt - spoke w/the pt, he is requesting a refill on Oxycodone 10-325 to be sent to [facility] [place]

## 2023-07-05 ENCOUNTER — Telehealth: Payer: Self-pay | Admitting: Orthopaedic Surgery

## 2023-07-05 MED ORDER — OXYCODONE-ACETAMINOPHEN 10-325 MG PO TABS: 1.0000 | ORAL_TABLET | ORAL | 0 refills | Status: DC | PRN

## 2023-07-05 NOTE — Telephone Encounter (Signed)
Dr. Sanjuan Dame pt - pt lvm requesting a refill on his Oxycodone 10-325 to be sent to [facility] [place]

## 2023-07-11 ENCOUNTER — Other Ambulatory Visit: Payer: Self-pay | Admitting: Orthopaedic Surgery

## 2023-07-11 NOTE — Telephone Encounter (Signed)
Dr. Sanjuan Dame pt - spoke w/the pt, pt requested a refill on Oxycodone 10-325, 30 tablets, every 4 hours PRN for pain to [facility] [place]

## 2023-07-12 MED ORDER — OXYCODONE-ACETAMINOPHEN 10-325 MG PO TABS: 1.0000 | ORAL_TABLET | ORAL | 0 refills | Status: DC | PRN

## 2023-07-19 ENCOUNTER — Telehealth: Payer: Self-pay | Admitting: Orthopaedic Surgery

## 2023-07-19 MED ORDER — OXYCODONE-ACETAMINOPHEN 10-325 MG PO TABS: 1.0000 | ORAL_TABLET | ORAL | 0 refills | Status: DC | PRN

## 2023-07-19 NOTE — Telephone Encounter (Signed)
Dr. Sanjuan Dame pt - pt lvm requesting a refill on his Oxycodone 10-325 to be sent to [facility] [place]

## 2023-07-21 ENCOUNTER — Ambulatory Visit: Payer: Medicaid Other

## 2023-07-21 ENCOUNTER — Encounter: Payer: Self-pay | Admitting: Nurse Practitioner

## 2023-07-21 ENCOUNTER — Other Ambulatory Visit: Payer: Self-pay | Admitting: *Deleted

## 2023-07-21 ENCOUNTER — Ambulatory Visit (INDEPENDENT_AMBULATORY_CARE_PROVIDER_SITE_OTHER): Payer: Medicaid Other | Admitting: Nurse Practitioner

## 2023-07-21 VITALS — BP 137/76 | HR 92 | Ht 68.0 in | Wt 328.2 lb

## 2023-07-21 DIAGNOSIS — Z7984 Long term (current) use of oral hypoglycemic drugs: Secondary | ICD-10-CM

## 2023-07-21 DIAGNOSIS — Z794 Long term (current) use of insulin: Secondary | ICD-10-CM

## 2023-07-21 DIAGNOSIS — I10 Essential (primary) hypertension: Secondary | ICD-10-CM | POA: Diagnosis not present

## 2023-07-21 DIAGNOSIS — E230 Hypopituitarism: Secondary | ICD-10-CM | POA: Diagnosis not present

## 2023-07-21 DIAGNOSIS — E782 Mixed hyperlipidemia: Secondary | ICD-10-CM | POA: Diagnosis not present

## 2023-07-21 DIAGNOSIS — Z7985 Long-term (current) use of injectable non-insulin antidiabetic drugs: Secondary | ICD-10-CM | POA: Diagnosis not present

## 2023-07-21 DIAGNOSIS — E1165 Type 2 diabetes mellitus with hyperglycemia: Secondary | ICD-10-CM

## 2023-07-21 DIAGNOSIS — Z91199 Patient's noncompliance with other medical treatment and regimen due to unspecified reason: Secondary | ICD-10-CM | POA: Diagnosis not present

## 2023-07-21 LAB — POCT GLYCOSYLATED HEMOGLOBIN (HGB A1C): Hemoglobin A1C: 7.8 % — AB (ref 4.0–5.6)

## 2023-07-21 MED ORDER — DEXCOM G7 SENSOR MISC: 1.0000 | 3 refills | Status: DC

## 2023-07-21 MED ORDER — TRUE METRIX BLOOD GLUCOSE TEST VI STRP: ORAL_STRIP | 12 refills | Status: AC

## 2023-07-21 MED ORDER — NOVOLOG FLEXPEN 100 UNIT/ML ~~LOC~~ SOPN: 10.0000 [IU] | PEN_INJECTOR | Freq: Three times a day (TID) | SUBCUTANEOUS | 0 refills | Status: DC

## 2023-07-21 MED ORDER — GLUCOSE BLOOD VI STRP: ORAL_STRIP | 12 refills | Status: AC

## 2023-07-21 MED ORDER — TESTOSTERONE CYPIONATE 200 MG/ML IM SOLN
200.0000 mg | Freq: Once | INTRAMUSCULAR | Status: AC
  Administered 2023-07-21: 200 mg via INTRAMUSCULAR

## 2023-07-21 MED ORDER — PEN NEEDLES 31G X 8 MM MISC: 6 refills | Status: DC

## 2023-07-21 MED ORDER — TRULICITY 4.5 MG/0.5ML ~~LOC~~ SOAJ: 4.5000 mg | SUBCUTANEOUS | 3 refills | Status: DC

## 2023-07-21 MED ORDER — INSULIN GLARGINE 100 UNIT/ML ~~LOC~~ SOLN: 40.0000 [IU] | Freq: Every day | SUBCUTANEOUS | 3 refills | Status: DC

## 2023-07-21 NOTE — Progress Notes (Signed)
07/21/2023, 3:34 PM      Endocrinology Follow Up Visit  Subjective:    Patient ID: [name], male    DOB: Jul 14, 1966.  [name] is being seen in follow up for management of currently uncontrolled symptomatic diabetes requested by  Mirna Mires, MD.   Past Medical History:  Diagnosis Date   Arthritis    Diabetes mellitus without complication [facility])    diagnosed about age 57   Gout    Hypertension    boarderline    Past Surgical History:  Procedure Laterality Date   HERNIA REPAIR  2008   MASS EXCISION Left age 43   L thigh- benign    Social History   Socioeconomic History   Marital status: Single    Spouse name: Not on file   Number of children: Not on file   Years of education: Not on file   Highest education level: Not on file  Occupational History   Not on file  Tobacco Use   Smoking status: Former    Current packs/day: 0.00    Average packs/day: 0.5 packs/day for 5.0 years (2.5 ttl pk-yrs)    Types: Cigarettes    Start date: 68    Quit date: 8    Years since quitting: 34.8   Smokeless tobacco: Never  Vaping Use   Vaping status: Never Used  Substance and Sexual Activity   Alcohol use: No   Drug use: No   Sexual activity: Not on file  Other Topics Concern   Not on file  Social History Narrative   Not on file   Social Determinants of Health   Financial Resource Strain: Not on file  Food Insecurity: Not on file  Transportation Needs: Not on file  Physical Activity: Not on file  Stress: Not on file  Social Connections: Unknown (02/09/2023)   Received from [facility], [facility]   Social Network    Social Network: Not on file    Family History  Problem Relation Age of Onset   Cancer Mother    Diabetes Mother    Heart failure Father    Hypertension Father    Diabetes Father    Heart attack Father     Outpatient Encounter Medications as of 07/21/2023   Medication Sig   amLODipine (NORVASC) 10 MG tablet TAKE 1 Tablet BY MOUTH ONCE EVERY DAY (Patient taking differently: Take 10 mg by mouth daily.)   cyclobenzaprine (FLEXERIL) 10 MG tablet Take 1 tablet (10 mg total) by mouth 3 (three) times daily as needed for muscle spasms.   furosemide (LASIX) 20 MG tablet Take 1 tablet (20 mg total) by mouth daily.   gabapentin (NEURONTIN) 100 MG capsule Take 1 capsule (100 mg total) by mouth 3 (three) times daily.   glucose blood test strip Use as instructed to monitor glucose 4 times daily   Insulin Pen Needle (PEN NEEDLES) 31G X 8 MM MISC Use to inject insulin 4 times daily   lisinopril (ZESTRIL) 20 MG tablet TAKE 1 Tablet BY MOUTH ONCE EVERY DAY (Patient taking differently: Take 20 mg by mouth daily.)   metFORMIN (GLUCOPHAGE-XR) 500 MG 24 hr tablet Take 2 tablets (1,000 mg total) by  mouth daily with breakfast.   metoCLOPramide (REGLAN) 10 MG tablet Take 1 tablet (10 mg total) by mouth every 8 (eight) hours as needed for nausea.   metoprolol tartrate (LOPRESSOR) 100 MG tablet TAKE 1 Tablet  BY MOUTH TWICE DAILY   NEEDLE, DISP, 18 G 18G X 1-1/2" MISC Use to draw up testosterone for injection   nystatin cream (MYCOSTATIN) Apply 1 application. topically 2 (two) times daily. To penile irritation.   oxyCODONE-acetaminophen (PERCOCET) 10-325 MG tablet Take 1 tablet by mouth every 4 (four) hours as needed for pain.   sildenafil (VIAGRA) 100 MG tablet Take 1 tablet (100 mg total) by mouth daily as needed for erectile dysfunction.   SYRINGE-NEEDLE, DISP, 3 ML 22G X 1" 3 ML MISC Use to inject testosterone   testosterone cypionate (DEPOTESTOSTERONE CYPIONATE) 200 MG/ML injection Inject 0.5 mLs (100 mg total) into the muscle every 7 (seven) days.   [DISCONTINUED] Continuous Glucose Sensor (DEXCOM G7 SENSOR) MISC Inject 1 Application into the skin as directed. Change sensor every 10 days as directed.   [DISCONTINUED] Dulaglutide (TRULICITY) 4.5 MG/0.5ML SOPN Inject 4.5  mg as directed once a week.   [DISCONTINUED] glucose blood (TRUE METRIX BLOOD GLUCOSE TEST) test strip Use as instructed to monitor glucose 4 times daily   [DISCONTINUED] insulin aspart (NOVOLOG FLEXPEN) 100 UNIT/ML FlexPen Inject 10-16 Units into the skin 3 (three) times daily with meals.   [DISCONTINUED] insulin glargine (LANTUS) 100 UNIT/ML injection Inject 0.8 mLs (80 Units total) into the skin daily. (Patient taking differently: Inject 40 Units into the skin daily.)   allopurinol (ZYLOPRIM) 300 MG tablet Take 1 tablet (300 mg total) by mouth daily. (Patient not taking: Reported on 07/21/2023)   atorvastatin (LIPITOR) 80 MG tablet TAKE 1 Tablet BY MOUTH ONCE EVERY DAY (Patient not taking: Reported on 07/21/2023)   Continuous Glucose Sensor (DEXCOM G7 SENSOR) MISC Inject 1 Application into the skin as directed. Change sensor every 10 days as directed.   doxycycline (VIBRA-TABS) 100 MG tablet Take 100 mg by mouth 2 (two) times daily. (Patient not taking: Reported on 07/21/2023)   Dulaglutide (TRULICITY) 4.5 MG/0.5ML SOAJ Inject 4.5 mg as directed once a week.   insulin aspart (NOVOLOG FLEXPEN) 100 UNIT/ML FlexPen Inject 10-16 Units into the skin 3 (three) times daily with meals.   insulin glargine (LANTUS) 100 UNIT/ML injection Inject 0.4 mLs (40 Units total) into the skin daily.   naproxen (NAPROSYN) 500 MG tablet Take 1 tablet (500 mg total) by mouth 2 (two) times daily. (Patient not taking: Reported on 07/21/2023)   No facility-administered encounter medications on file as of 07/21/2023.    ALLERGIES: Allergies  Allergen Reactions   Wound Dressing Adhesive     Hives, itching- noted with the G 7 Dexcom Sensor    VACCINATION STATUS: Immunization History  Administered Date(s) Administered   Influenza Inj Mdck Quad Pf 06/13/2019   Influenza,inj,Quad PF,6+ Mos 06/30/2017, 07/08/2018   Moderna Sars-Covid-2 Vaccination 04/01/2020, 04/29/2020    Diabetes He presents for his follow-up  diabetic visit. He has type 2 diabetes mellitus. Onset time: He was diagnosed at approximate age of 49 years. His disease course has been stable. Pertinent negatives for hypoglycemia include no confusion, headaches, pallor, seizures, sweats or tremors. Associated symptoms include fatigue and weight loss. Pertinent negatives for diabetes include no blurred vision, no chest pain, no polydipsia, no polyphagia, no polyuria and no weakness. There are no hypoglycemic complications. Symptoms are improving. There are no diabetic complications. Risk factors for coronary artery  disease include dyslipidemia, diabetes mellitus, hypertension, male sex, obesity and sedentary lifestyle. Current diabetic treatment includes intensive insulin program and oral agent (monotherapy) (and Trulicity). He is compliant with treatment most of the time. His weight is fluctuating minimally. He is following a generally unhealthy diet. When asked about meal planning, he reported none. He has not had a previous visit with a dietitian. He never participates in exercise. His home blood glucose trend is decreasing steadily. His overall blood glucose range is 140-180 mg/dl. (He presents today with his CGM showing mostly at goal glycemic profile.  His POCT A1c today is 7.8%, unchanged from last visit.  Analysis of his CGM shows TIR 56%, TAR 43%, TBR 1% with a GMI of 7.5%.  He denies any significant hypoglycemia.) An ACE inhibitor/angiotensin II receptor blocker is being taken. He does not see a podiatrist.Eye exam is current.  Hyperlipidemia This is a chronic problem. The current episode started more than 1 year ago. The problem is uncontrolled. Recent lipid tests were reviewed and are high. Exacerbating diseases include diabetes and obesity. Factors aggravating his hyperlipidemia include beta blockers and fatty foods. Pertinent negatives include no chest pain, myalgias or shortness of breath. Current antihyperlipidemic treatment includes statins.  The current treatment provides moderate improvement of lipids. Compliance problems include adherence to diet, adherence to exercise and medication cost.  Risk factors for coronary artery disease include dyslipidemia, diabetes mellitus, family history, hypertension, male sex, obesity and a sedentary lifestyle.  Hypertension This is a chronic problem. The current episode started more than 1 year ago. The problem has been resolved since onset. The problem is controlled. Pertinent negatives include no blurred vision, chest pain, headaches, malaise/fatigue, neck pain, palpitations, shortness of breath or sweats. There are no associated agents to hypertension. Risk factors for coronary artery disease include dyslipidemia, diabetes mellitus, male gender, obesity and sedentary lifestyle. Past treatments include ACE inhibitors, beta blockers, calcium channel blockers and diuretics. The current treatment provides mild improvement. Compliance problems include medication cost, diet and exercise.     Review of systems  Constitutional: + fluctuating body weight,  current Body mass index is 49.9 kg/m., + fatigue, no subjective hyperthermia, no subjective hypothermia Eyes: + blurry vision, no xerophthalmia ENT: no sore throat, no nodules palpated in throat, no dysphagia/odynophagia, no hoarseness Cardiovascular: no chest pain, no shortness of breath, no palpitations, no leg swelling Respiratory: no cough, no shortness of breath Gastrointestinal: no nausea/vomiting/diarrhea Musculoskeletal: + generalized muscle/joint aches Skin: no rashes, no hyperemia Neurological: no tremors, no numbness, no tingling, no dizziness Psychiatric: no depression, no anxiety    Objective:    BP 137/76 (BP Location: Right Arm, Patient Position: Sitting, Cuff Size: Large)   Pulse 92   Ht 5\' 8"  (1.727 m)   Wt (!) 328 lb 3.2 oz (148.9 kg)   BMI 49.90 kg/m   Wt Readings from Last 3 Encounters:  07/21/23 (!) 328 lb 3.2 oz (148.9  kg)  06/09/23 (!) 318 lb (144.2 kg)  06/07/23 (!) 318 lb (144.2 kg)    BP Readings from Last 3 Encounters:  07/21/23 137/76  06/09/23 136/82  06/07/23 109/77    Physical Exam- Limited  Constitutional:  Body mass index is 49.9 kg/m. , not in acute distress, normal state of mind Eyes:  EOMI, no exophthalmos Neck: Supple Musculoskeletal: no gross deformities, strength intact in all four extremities, no gross restriction of joint movements Skin:  no rashes, no hyperemia Neurological: no tremor with outstretched hands   CMP ( most recent)  CMP     Component Value Date/Time   NA 137 11/10/2022 1833   K 4.2 11/10/2022 1833   CL 99 11/10/2022 1833   CO2 29 11/10/2022 1819   GLUCOSE 131 (H) 11/10/2022 1833   BUN 8 11/10/2022 1833   CREATININE 0.70 11/10/2022 1833   CREATININE 1.42 (H) 09/08/2017 1540   CALCIUM 8.6 (L) 11/10/2022 1819   PROT 7.6 11/10/2022 1819   ALBUMIN 3.3 (L) 11/10/2022 1819   AST 14 (L) 11/10/2022 1819   ALT 16 11/10/2022 1819   ALKPHOS 102 11/10/2022 1819   BILITOT 0.1 (L) 11/10/2022 1819   GFRNONAA >60 11/10/2022 1819   GFRAA >60 03/17/2020 1200     Diabetic Labs (most recent): Lab Results  Component Value Date   HGBA1C 7.8 (A) 07/21/2023   HGBA1C 7.8 (A) 04/18/2023   HGBA1C 7.9 (A) 01/12/2023   MICROALBUR 7.2 (H) 07/19/2022   MICROALBUR <3.0 (H) 07/07/2021   MICROALBUR 80 02/26/2021     Lipid Panel ( most recent) Lipid Panel     Component Value Date/Time   CHOL 263 (H) 07/19/2022 1337   TRIG 77 07/19/2022 1337   HDL 62 07/19/2022 1337   CHOLHDL 4.2 07/19/2022 1337   VLDL 15 07/19/2022 1337   LDLCALC 186 (H) 07/19/2022 1337      Lab Results  Component Value Date   TSH 0.532 06/28/2017       Assessment & Plan:   1) Uncontrolled type 2 diabetes mellitus with hyperglycemia (HCC)  - [name] has currently uncontrolled symptomatic type 2 DM since  57 years of age.  He presents today with his CGM showing mostly at goal  glycemic profile.  His POCT A1c today is 7.8%, unchanged from last visit.  Analysis of his CGM shows TIR 56%, TAR 43%, TBR 1% with a GMI of 7.5%.  He denies any significant hypoglycemia.  -Recent labs reviewed.  - I had a long discussion with him about the progressive nature of diabetes and the pathology behind its complications. -his diabetes is complicated by obesity/sedentary life, inadequate insurance coverage,  And he remains at a high risk for more acute and chronic complications which include CAD, CVA, CKD, retinopathy, and neuropathy. These are all discussed in detail with him.  - Nutritional counseling repeated at each appointment due to patients tendency to fall back in to old habits.  - The patient admits there is a room for improvement in their diet and drink choices. -  Suggestion is made for the patient to avoid simple carbohydrates from their diet including Cakes, Sweet Desserts / Pastries, Ice Cream, Soda (diet and regular), Sweet Tea, Candies, Chips, Cookies, Sweet Pastries, Store Bought Juices, Alcohol in Excess of 1-2 drinks a day, Artificial Sweeteners, Coffee Creamer, and "Sugar-free" Products. This will help patient to have stable blood glucose profile and potentially avoid unintended weight gain.   - I encouraged the patient to switch to unprocessed or minimally processed complex starch and increased protein intake (animal or plant source), fruits, and vegetables.   - Patient is advised to stick to a routine mealtimes to eat 3 meals a day and avoid unnecessary snacks (to snack only to correct hypoglycemia).  - he will be scheduled with Norm Salt, RDN, CDE for diabetes education.  - I have approached him with the following individualized plan to manage  his diabetes and patient agrees:   -Based on his stable, mostly at target glycemic profile, no changes will be made to his medications today.  He is advised to continue Lantus 40 units SQ nightly, continue Novolog 10-16  units TID with meals if glucose is above 90 and he is eating (Specific instructions on how to titrate insulin dosage based on glucose readings given to patient in writing), continue Metformin 1000 mg ER PO daily with breakfast, and Trulicity 4.5 mg SQ weekly.    He is encouraged consistently monitor blood glucose 4 times per day, before meals and at bedtime and report to the clinic if readings are less than 70 or greater than 300 for 3 tests in a row.  He can benefit from CGM device given his MDI and improving glycemic profile.  This will be important in preventing hypoglycemia.  I recommended he try using Flonase and skin tac wipes to help put barrier between him and the adhesive, which may help prevent allergic reaction.  - Specific targets for  A1c;  LDL, HDL,  and Triglycerides were discussed with the patient.  2) Blood Pressure /Hypertension:   His blood pressure is controlled to target.  He is advised to continue Norvasc 5 mg po daily, Lasix 20 mg po daily, Lisinopril 20 mg po daily, and Metoprolol 100 mg po twice daily.    3) Lipids/Hyperlipidemia:   His recent lipid panel from 07/19/22 shows uncontrolled LDL of 186.  He is advised to continue Atorvastatin 40 mg po daily at bedtime.  Side effects and precautions discussed with him.  He has annual physical coming up with PCP in September, I asked he have any labs sent here for our records as well.  4)  Weight/Diet:  His Body mass index is 49.9 kg/m.-   clearly complicating his diabetes care.   he is  a candidate for modest weight loss. I discussed with him the fact that loss of 5 - 10% of his  current body weight will have the most impact on his diabetes management.  Exercise, and detailed carbohydrates information provided  -  detailed on discharge instructions.  He is a good candidate for bariatric surgery, however at this point he has no insurance.  5) Chronic Care/Health Maintenance: -he is on ACEI/ARB and Statin medications and is  encouraged to initiate and continue to follow up with Ophthalmology, Dentist,  Podiatrist at least yearly or according to recommendations, and advised to stay away from smoking. I have recommended yearly flu vaccine and pneumonia vaccine at least every 5 years; moderate intensity exercise for up to 150 minutes weekly; and  sleep for at least 7 hours a day.  - he is advised to maintain close follow up with Mirna Mires, MD for primary care needs, as well as his other providers for optimal and coordinated care.      I spent  36  minutes in the care of the patient today including review of labs from CMP, Lipids, Thyroid Function, Hematology (current and previous including abstractions from other facilities); face-to-face time discussing  his blood glucose readings/logs, discussing hypoglycemia and hyperglycemia episodes and symptoms, medications doses, his options of short and long term treatment based on the latest standards of care / guidelines;  discussion about incorporating lifestyle medicine;  and documenting the encounter. Risk reduction counseling performed per USPSTF guidelines to reduce obesity and cardiovascular risk factors.     Please refer to Patient Instructions for Blood Glucose Monitoring and Insulin/Medications Dosing Guide"  in media tab for additional information. Please  also refer to " Patient Self Inventory" in the Media  tab for reviewed elements of pertinent  patient history.  [name] participated in the discussions, expressed understanding, and voiced agreement with the above plans.  All questions were answered to his satisfaction. he is encouraged to contact clinic should he have any questions or concerns prior to his return visit.   Follow up plan: - Return in about 4 months (around 11/18/2023) for Diabetes F/U with A1c in office, No previsit labs, Bring meter and logs.  Ronny Bacon, [facility] [address] [place], [place]  [address] Phone: [phone] Fax: [phone]  07/21/2023, 3:34 PM

## 2023-07-21 NOTE — Progress Notes (Signed)
Patient in office for a re-teaching of IM injection for his testosterone injections. Patient states he stopped giving himself injections and wanted a refresher before he started back.   IM injection  Medication: testosterone Dose: 0.5 ml (100 mg) Location: left thigh Lot: [id number] Exp: 2027/01  Patient was able to demonstrate IM injection well, no complications were noted

## 2023-07-26 ENCOUNTER — Telehealth: Payer: Self-pay | Admitting: Orthopaedic Surgery

## 2023-07-26 MED ORDER — OXYCODONE-ACETAMINOPHEN 10-325 MG PO TABS: 1.0000 | ORAL_TABLET | ORAL | 0 refills | Status: DC | PRN

## 2023-07-26 NOTE — Telephone Encounter (Signed)
 Dr. Sanjuan Dame pt - pt lvm requesting a refill on Oxycodone 10-325 to be sent to [facility].

## 2023-07-27 ENCOUNTER — Ambulatory Visit (INDEPENDENT_AMBULATORY_CARE_PROVIDER_SITE_OTHER): Payer: Medicaid Other | Admitting: Podiatry

## 2023-07-27 DIAGNOSIS — M79674 Pain in right toe(s): Secondary | ICD-10-CM

## 2023-07-27 DIAGNOSIS — B351 Tinea unguium: Secondary | ICD-10-CM

## 2023-07-27 DIAGNOSIS — E0843 Diabetes mellitus due to underlying condition with diabetic autonomic (poly)neuropathy: Secondary | ICD-10-CM | POA: Diagnosis not present

## 2023-07-27 DIAGNOSIS — M79675 Pain in left toe(s): Secondary | ICD-10-CM

## 2023-07-27 NOTE — Progress Notes (Signed)
   Chief Complaint  Patient presents with   Nail Problem    Patient is here for Dfc and callouses    Subjective:  57 y.o. male with PMHx of diabetes mellitus presenting today for routine footcare.  Patient believes that the wound to the plantar aspect of the foot has healed.  His feet in general are very sensitive.  He states that he is going next week to a pedicure salon where they will soak his foot and file his nails and calluses.   Past Medical History:  Diagnosis Date   Arthritis    Diabetes mellitus without complication (HCC)    diagnosed about age 87   Gout    Hypertension    boarderline    Past Surgical History:  Procedure Laterality Date   HERNIA REPAIR  2008   MASS EXCISION Left age 3   L thigh- benign    Allergies  Allergen Reactions   Wound Dressing Adhesive     Hives, itching- noted with the G 7 Dexcom Sensor      RT foot 02/02/2023  Objective/Physical Exam General: The patient is alert and oriented x3 in no acute distress.  Dermatology:  The wound to the plantar aspect of the first MTP of the right foot has healed completely.  Complete reepithelialization has occurred.  There is some hyperkeratotic callus tissue noted to the first MTP bilateral.  No open wounds however. Hyperkeratotic dystrophic elongated thickened nails noted 1-5 bilateral with hypersensitivity to touch  Vascular: Skin is warm to touch.  Capillary refill WNL.  However the patient does experience rest pain especially in the toes concerning for possible ischemia  Neurological: Light touch and protective threshold diminished bilaterally.   Musculoskeletal Exam: Range of motion within normal limits to all pedal and ankle joints bilateral. Muscle strength 5/5 in all groups bilateral.   Assessment: 1.  Ulcer plantar aspect of the first MTP right secondary to diabetes mellitus 2. diabetes mellitus w/ peripheral neuropathy 3.  Pain due to onychomycosis of toenails both  Plan of Care:   -Patient evaluated -Patient declined any nail debridement or callus debridement today.  He states that he is going to a [place]. -Continue wearing good supportive shoes and sneakers -Refill prescription for gabapentin 100 mg 3 times daily -Return to clinic 3 months routine footcare   Felecia Shelling, DPM [facility]  Dr. Felecia Shelling, DPM    [address] [place], [place] [address]                Office [phone]  Fax [phone]

## 2023-08-02 ENCOUNTER — Telehealth: Payer: Self-pay | Admitting: Orthopaedic Surgery

## 2023-08-02 MED ORDER — OXYCODONE-ACETAMINOPHEN 10-325 MG PO TABS: 1.0000 | ORAL_TABLET | ORAL | 0 refills | Status: DC | PRN

## 2023-08-02 NOTE — Telephone Encounter (Signed)
Dr. Sanjuan Dame pt - pt lvm requesting a refill for Oxycodone 10-325 to be sent to [facility] [place].  [phone]

## 2023-08-03 ENCOUNTER — Other Ambulatory Visit: Payer: Self-pay | Admitting: Nurse Practitioner

## 2023-08-03 DIAGNOSIS — Z7985 Long-term (current) use of injectable non-insulin antidiabetic drugs: Secondary | ICD-10-CM

## 2023-08-03 DIAGNOSIS — Z7984 Long term (current) use of oral hypoglycemic drugs: Secondary | ICD-10-CM

## 2023-08-03 DIAGNOSIS — E1165 Type 2 diabetes mellitus with hyperglycemia: Secondary | ICD-10-CM

## 2023-08-03 DIAGNOSIS — Z794 Long term (current) use of insulin: Secondary | ICD-10-CM

## 2023-08-09 ENCOUNTER — Telehealth: Payer: Self-pay | Admitting: Orthopaedic Surgery

## 2023-08-09 MED ORDER — OXYCODONE-ACETAMINOPHEN 10-325 MG PO TABS: 1.0000 | ORAL_TABLET | ORAL | 0 refills | Status: DC | PRN

## 2023-08-09 NOTE — Telephone Encounter (Signed)
Dr. Brooke Bonito pt - pt lvm requesting a refill on Oxycodone 10-325 to be sent to [place].

## 2023-08-16 ENCOUNTER — Telehealth: Payer: Self-pay | Admitting: Orthopaedic Surgery

## 2023-08-16 NOTE — Telephone Encounter (Signed)
Dr. Sanjuan Dame pt - pt lvm requesting a refill on Oxycodone 10-325 to [facility] [place]

## 2023-08-17 MED ORDER — OXYCODONE-ACETAMINOPHEN 10-325 MG PO TABS: 1.0000 | ORAL_TABLET | ORAL | 0 refills | Status: DC | PRN

## 2023-08-23 ENCOUNTER — Telehealth: Payer: Self-pay | Admitting: Orthopaedic Surgery

## 2023-08-23 ENCOUNTER — Telehealth: Payer: Self-pay

## 2023-08-23 NOTE — Telephone Encounter (Signed)
DR. Regis Bill called yesterday and LVM he would like someone to call him about his prescription.   Please call him back at [phone]

## 2023-08-24 MED ORDER — OXYCODONE-ACETAMINOPHEN 10-325 MG PO TABS: 1.0000 | ORAL_TABLET | ORAL | 0 refills | Status: DC | PRN

## 2023-09-01 ENCOUNTER — Telehealth: Payer: Self-pay | Admitting: Orthopaedic Surgery

## 2023-09-01 MED ORDER — OXYCODONE-ACETAMINOPHEN 10-325 MG PO TABS: 1.0000 | ORAL_TABLET | ORAL | 0 refills | Status: DC | PRN

## 2023-09-01 NOTE — Telephone Encounter (Signed)
Refill request received via phone for  Oxycodone

## 2023-09-06 ENCOUNTER — Telehealth: Payer: Self-pay | Admitting: Orthopaedic Surgery

## 2023-09-06 NOTE — Telephone Encounter (Signed)
Dr. Sanjuan Dame pt - pt lvm requesting a refill on Oxycodone 10-325, 30 tablets, every 4 hours PRN for pain to be sent to [facility] [place]

## 2023-09-08 MED ORDER — OXYCODONE-ACETAMINOPHEN 10-325 MG PO TABS: 1.0000 | ORAL_TABLET | ORAL | 0 refills | Status: DC | PRN

## 2023-09-13 ENCOUNTER — Telehealth: Payer: Self-pay

## 2023-09-13 ENCOUNTER — Other Ambulatory Visit (HOSPITAL_COMMUNITY): Payer: Self-pay

## 2023-09-13 ENCOUNTER — Ambulatory Visit: Payer: Medicaid Other | Admitting: Orthopaedic Surgery

## 2023-09-13 NOTE — Telephone Encounter (Signed)
 Pharmacy Patient Advocate Encounter   Received notification from CoverMyMeds that prior authorization for Dexcom G7 sensor is required/requested.   Insurance verification completed.   The patient is insured through [employer] .   Per test claim: PA required; PA submitted to above mentioned insurance via CoverMyMeds Key/confirmation #/EOC [id number] Status is pending

## 2023-09-14 ENCOUNTER — Ambulatory Visit: Payer: Medicaid Other | Admitting: Orthopaedic Surgery

## 2023-09-14 ENCOUNTER — Encounter: Payer: Self-pay | Admitting: Orthopaedic Surgery

## 2023-09-14 VITALS — BP 153/91 | HR 80 | Ht 68.0 in | Wt 330.0 lb

## 2023-09-14 DIAGNOSIS — Z6841 Body Mass Index (BMI) 40.0 and over, adult: Secondary | ICD-10-CM

## 2023-09-14 DIAGNOSIS — M545 Low back pain, unspecified: Secondary | ICD-10-CM

## 2023-09-14 MED ORDER — OXYCODONE-ACETAMINOPHEN 10-325 MG PO TABS: 1.0000 | ORAL_TABLET | ORAL | 0 refills | Status: DC | PRN

## 2023-09-14 NOTE — Progress Notes (Signed)
 My back is sore.  He has more back pain secondary to the cold weather.  He has been taking his medicine and doing his exercises.  He has gained some weight again.  He has no new trauma, no weakness.  He has peripheral neuropathy and I will have him see neurologist for this.  Spine/Pelvis examination:  Inspection:  Overall, sacoiliac joint benign and hips nontender; without crepitus or defects.   Thoracic spine inspection: Alignment normal without kyphosis present   Lumbar spine inspection:  Alignment  with normal lumbar lordosis, without scoliosis apparent.   Thoracic spine palpation:  without tenderness of spinal processes   Lumbar spine palpation: without tenderness of lumbar area; without tightness of lumbar muscles    Range of Motion:   Lumbar flexion, forward flexion is normal without pain or tenderness    Lumbar extension is full without pain or tenderness   Left lateral bend is normal without pain or tenderness   Right lateral bend is normal without pain or tenderness   Straight leg raising is normal  Strength & tone: normal   Stability overall normal stability  Encounter Diagnoses  Name Primary?   Lumbar pain Yes   Morbid obesity due to excess calories (HCC)    Body mass index 50.0-59.9, adult (HCC)    I have reviewed the [place]  Controlled Substance Reporting System web site prior to prescribing narcotic medicine for this patient.  Return in three months.  Call if any problem.  Precautions discussed.  Electronically Signed Lemond Stable, MD 1/8/20251:56 PM

## 2023-09-16 NOTE — Telephone Encounter (Signed)
 Pharmacy Patient Advocate Encounter  Received notification from [facility] that Prior Authorization for Dexcom G7 sensor has been APPROVED through 09/12/2024   PA #/Case ID/Reference #: [id number]

## 2023-09-19 NOTE — Telephone Encounter (Signed)
 Patient was called and made aware that he had been approved.

## 2023-09-21 ENCOUNTER — Telehealth: Payer: Self-pay | Admitting: Orthopaedic Surgery

## 2023-09-21 MED ORDER — OXYCODONE-ACETAMINOPHEN 10-325 MG PO TABS: 1.0000 | ORAL_TABLET | ORAL | 0 refills | Status: DC | PRN

## 2023-09-21 NOTE — Telephone Encounter (Signed)
 Dr. Sanjuan Dame pt - pt lvm requesting a refill for Oxycodone 10-325 to be sent to [facility] [place]

## 2023-09-28 ENCOUNTER — Telehealth: Payer: Self-pay | Admitting: Orthopaedic Surgery

## 2023-09-28 MED ORDER — OXYCODONE-ACETAMINOPHEN 10-325 MG PO TABS: 1.0000 | ORAL_TABLET | ORAL | 0 refills | Status: DC | PRN

## 2023-09-28 NOTE — Telephone Encounter (Signed)
Dr. Sanjuan Dame pt - pt lvm requesting a refill on Oxycodone 10-325 be sent to [facility] [place]

## 2023-10-05 ENCOUNTER — Telehealth: Payer: Self-pay | Admitting: Orthopaedic Surgery

## 2023-10-05 MED ORDER — OXYCODONE-ACETAMINOPHEN 10-325 MG PO TABS: 1.0000 | ORAL_TABLET | ORAL | 0 refills | Status: DC | PRN

## 2023-10-05 NOTE — Telephone Encounter (Signed)
Dr. Sanjuan Dame pt - pt lvm requesting a refill on Oxycodone 10-325 to be sent to [facility] [place].

## 2023-10-11 ENCOUNTER — Telehealth: Payer: Self-pay | Admitting: Orthopaedic Surgery

## 2023-10-11 MED ORDER — OXYCODONE-ACETAMINOPHEN 10-325 MG PO TABS: 1.0000 | ORAL_TABLET | ORAL | 0 refills | Status: DC | PRN

## 2023-10-11 NOTE — Telephone Encounter (Signed)
 Dr. Sanjuan Dame pt - pt lvm requesting a refill on Oxycodone 10-325 to be sent to [facility].

## 2023-10-18 ENCOUNTER — Telehealth: Payer: Self-pay | Admitting: Orthopaedic Surgery

## 2023-10-18 MED ORDER — OXYCODONE-ACETAMINOPHEN 10-325 MG PO TABS: 1.0000 | ORAL_TABLET | ORAL | 0 refills | Status: DC | PRN

## 2023-10-18 NOTE — Telephone Encounter (Signed)
Dr. Sanjuan Dame pt - spoke w/the pt, he is requesting a refill on Oxycodone 10-325 to be sent to [facility] [place]

## 2023-10-24 ENCOUNTER — Telehealth: Payer: Self-pay

## 2023-10-24 NOTE — Telephone Encounter (Signed)
Oxycodone-Acetaminophen 10/325 MG  Qty 30 Tablets  PATIENT USES [facility]

## 2023-10-25 MED ORDER — OXYCODONE-ACETAMINOPHEN 10-325 MG PO TABS: 1.0000 | ORAL_TABLET | ORAL | 0 refills | Status: DC | PRN

## 2023-10-26 ENCOUNTER — Ambulatory Visit: Payer: Medicaid Other | Admitting: Podiatry

## 2023-10-31 ENCOUNTER — Telehealth: Payer: Self-pay | Admitting: Orthopaedic Surgery

## 2023-10-31 MED ORDER — OXYCODONE-ACETAMINOPHEN 10-325 MG PO TABS: 1.0000 | ORAL_TABLET | ORAL | 0 refills | Status: DC | PRN

## 2023-10-31 NOTE — Telephone Encounter (Signed)
 Dr. Sanjuan Dame pt - pt lvm requesting a refill for Oxycodone 10-325 to be sent to [facility] [place]

## 2023-11-07 ENCOUNTER — Encounter: Payer: Self-pay | Admitting: Podiatry

## 2023-11-07 ENCOUNTER — Ambulatory Visit (INDEPENDENT_AMBULATORY_CARE_PROVIDER_SITE_OTHER): Payer: Medicaid Other | Admitting: Podiatry

## 2023-11-07 ENCOUNTER — Telehealth: Payer: Self-pay | Admitting: Orthopaedic Surgery

## 2023-11-07 DIAGNOSIS — M79675 Pain in left toe(s): Secondary | ICD-10-CM

## 2023-11-07 DIAGNOSIS — E119 Type 2 diabetes mellitus without complications: Secondary | ICD-10-CM | POA: Diagnosis not present

## 2023-11-07 DIAGNOSIS — M79674 Pain in right toe(s): Secondary | ICD-10-CM

## 2023-11-07 DIAGNOSIS — B351 Tinea unguium: Secondary | ICD-10-CM

## 2023-11-07 MED ORDER — OXYCODONE-ACETAMINOPHEN 10-325 MG PO TABS: 1.0000 | ORAL_TABLET | ORAL | 0 refills | Status: DC | PRN

## 2023-11-07 NOTE — Telephone Encounter (Signed)
 Dr. Sanjuan Dame pt - pt lvm requesting a refill on Oxycodone 10-325 to be sent to [facility].

## 2023-11-07 NOTE — Progress Notes (Signed)
   Chief Complaint  Patient presents with   Debridement    Trim toenails - diabetic - 7.8    Subjective:  58 y.o. male with PMHx of diabetes mellitus presenting today for routine footcare.  Patient believes that the wound to the plantar aspect of the foot has healed.  His feet in general are very sensitive.  He states that he is going next week to a pedicure salon where they will soak his foot and file his nails and calluses.   Past Medical History:  Diagnosis Date   Arthritis    Diabetes mellitus without complication (HCC)    diagnosed about age 37   Gout    Hypertension    boarderline    Past Surgical History:  Procedure Laterality Date   HERNIA REPAIR  2008   MASS EXCISION Left age 59   L thigh- benign    Allergies  Allergen Reactions   Wound Dressing Adhesive     Hives, itching- noted with the G 7 Dexcom Sensor      Objective/Physical Exam General: The patient is alert and oriented x3 in no acute distress.  Dermatology:  Hyperkeratotic preulcerative callus tissue noted to the plantar aspect of the feet  Vascular: Skin is warm to touch.  Capillary refill WNL.  However the patient does experience rest pain especially in the toes concerning for possible ischemia  Neurological: Light touch and protective threshold diminished bilaterally.   Musculoskeletal Exam: Range of motion within normal limits to all pedal and ankle joints bilateral. Muscle strength 5/5 in all groups bilateral.   Assessment: 1.  Diabetes mellitus with peripheral polyneuropathy 2.  Pain due to onychomycosis of toenails both  Plan of Care:  -Patient evaluated.  Comprehensive diabetic foot exam performed today -Patient declined any nail debridement or callus debridement today.  He states that he is going to a [place]. -Continue wearing good supportive shoes and sneakers -Refill prescription for gabapentin 100 mg 3 times daily -Return to clinic 3 months routine footcare   Felecia Shelling, DPM [facility]  Dr. Felecia Shelling, DPM    [address] [place], [place] [address]                Office [phone]  Fax [phone]

## 2023-11-14 ENCOUNTER — Telehealth: Payer: Self-pay

## 2023-11-14 NOTE — Telephone Encounter (Signed)
 Oxycodone -Acetaminophen10/325 MG Qty 30 Tablets  PATIENT USES [facility]

## 2023-11-15 MED ORDER — OXYCODONE-ACETAMINOPHEN 10-325 MG PO TABS: 1.0000 | ORAL_TABLET | ORAL | 0 refills | Status: DC | PRN

## 2023-11-16 ENCOUNTER — Other Ambulatory Visit: Payer: Self-pay | Admitting: Urology

## 2023-11-16 ENCOUNTER — Telehealth: Payer: Self-pay | Admitting: Urology

## 2023-11-16 DIAGNOSIS — E230 Hypopituitarism: Secondary | ICD-10-CM

## 2023-11-16 MED ORDER — TESTOSTERONE CYPIONATE 200 MG/ML IM SOLN: 100.0000 mg | INTRAMUSCULAR | 1 refills | Status: DC

## 2023-11-16 NOTE — Telephone Encounter (Signed)
 Refill please.

## 2023-11-16 NOTE — Telephone Encounter (Signed)
 Pharmacy [facility]    testosterone cypionate (DEPOTESTOSTERONE CYPIONATE) 200 MG/ML   NEEDLE, DISP, 18 G 18G X 1-1/2" MISC

## 2023-11-21 ENCOUNTER — Telehealth: Payer: Self-pay

## 2023-11-21 ENCOUNTER — Ambulatory Visit: Payer: Medicaid Other | Admitting: Nurse Practitioner

## 2023-11-21 NOTE — Telephone Encounter (Signed)
 Oxycodone -Acetaminophen10/325 MG Qty 30 Tablets  PATIENT USES [facility]

## 2023-11-22 MED ORDER — OXYCODONE-ACETAMINOPHEN 10-325 MG PO TABS
1.0000 | ORAL_TABLET | ORAL | 0 refills | Status: DC | PRN
Start: 2023-11-22 — End: 2023-11-28

## 2023-11-28 ENCOUNTER — Telehealth: Payer: Self-pay | Admitting: Radiology

## 2023-11-28 MED ORDER — OXYCODONE-ACETAMINOPHEN 10-325 MG PO TABS: 1.0000 | ORAL_TABLET | ORAL | 0 refills | Status: DC | PRN

## 2023-11-28 NOTE — Telephone Encounter (Signed)
 [facility]icate[facility] refill request[facility]one[facility]

## 2023-11-29 ENCOUNTER — Ambulatory Visit (INDEPENDENT_AMBULATORY_CARE_PROVIDER_SITE_OTHER): Admitting: Nurse Practitioner

## 2023-11-29 ENCOUNTER — Encounter: Payer: Self-pay | Admitting: Nurse Practitioner

## 2023-11-29 VITALS — BP 128/76 | HR 80 | Ht 68.0 in | Wt 342.0 lb

## 2023-11-29 DIAGNOSIS — E1165 Type 2 diabetes mellitus with hyperglycemia: Secondary | ICD-10-CM | POA: Diagnosis not present

## 2023-11-29 DIAGNOSIS — I10 Essential (primary) hypertension: Secondary | ICD-10-CM | POA: Diagnosis not present

## 2023-11-29 DIAGNOSIS — Z794 Long term (current) use of insulin: Secondary | ICD-10-CM

## 2023-11-29 DIAGNOSIS — Z7985 Long-term (current) use of injectable non-insulin antidiabetic drugs: Secondary | ICD-10-CM

## 2023-11-29 DIAGNOSIS — E782 Mixed hyperlipidemia: Secondary | ICD-10-CM

## 2023-11-29 DIAGNOSIS — Z7984 Long term (current) use of oral hypoglycemic drugs: Secondary | ICD-10-CM

## 2023-11-29 LAB — POCT GLYCOSYLATED HEMOGLOBIN (HGB A1C): Hemoglobin A1C: 8.3 % — AB (ref 4.0–5.6)

## 2023-11-29 MED ORDER — NOVOLOG FLEXPEN 100 UNIT/ML ~~LOC~~ SOPN
10.0000 [IU] | PEN_INJECTOR | Freq: Three times a day (TID) | SUBCUTANEOUS | 3 refills | Status: DC
Start: 2023-11-29 — End: 2024-04-05

## 2023-11-29 MED ORDER — METFORMIN HCL ER 500 MG PO TB24: 1000.0000 mg | ORAL_TABLET | Freq: Every day | ORAL | 3 refills | Status: DC

## 2023-11-29 MED ORDER — TRULICITY 4.5 MG/0.5ML ~~LOC~~ SOAJ: 4.5000 mg | SUBCUTANEOUS | 3 refills | Status: DC

## 2023-11-29 MED ORDER — DEXCOM G7 SENSOR MISC
1.0000 | 3 refills | Status: DC
Start: 2023-11-29 — End: 2024-04-05

## 2023-11-29 NOTE — Progress Notes (Signed)
 11/29/2023, 1:52 PM      Endocrinology Follow Up Visit  Subjective:    Patient ID: [name], male    DOB: [birth year].  [name] is being seen in follow up for management of currently uncontrolled symptomatic diabetes requested by  Mirna Mires, MD.   Past Medical History:  Diagnosis Date   Arthritis    Diabetes mellitus without complication [facility])    diagnosed about age 58   Gout    Hypertension    boarderline    Past Surgical History:  Procedure Laterality Date   HERNIA REPAIR  2008   MASS EXCISION Left age 89   L thigh- benign    Social History   Socioeconomic History   Marital status: Single    Spouse name: Not on file   Number of children: Not on file   Years of education: Not on file   Highest education level: Not on file  Occupational History   Not on file  Tobacco Use   Smoking status: Former    Current packs/day: 0.00    Average packs/day: 0.5 packs/day for 5.0 years (2.5 ttl pk-yrs)    Types: Cigarettes    Start date: 89    Quit date: 75    Years since quitting: 35.2   Smokeless tobacco: Never  Vaping Use   Vaping status: Never Used  Substance and Sexual Activity   Alcohol use: No   Drug use: No   Sexual activity: Not on file  Other Topics Concern   Not on file  Social History Narrative   Not on file   Social Drivers of Health   Financial Resource Strain: Not on file  Food Insecurity: Not on file  Transportation Needs: Not on file  Physical Activity: Not on file  Stress: Not on file  Social Connections: Unknown (02/09/2023)   Received from [facility]   Social Network    Social Network: Not on file    Family History  Problem Relation Age of Onset   Cancer Mother    Diabetes Mother    Heart failure Father    Hypertension Father    Diabetes Father    Heart attack Father     Outpatient Encounter Medications as of 11/29/2023  Medication Sig    allopurinol (ZYLOPRIM) 300 MG tablet Take 1 tablet (300 mg total) by mouth daily.   amLODipine (NORVASC) 10 MG tablet TAKE 1 Tablet BY MOUTH ONCE EVERY DAY (Patient taking differently: Take 10 mg by mouth daily.)   atorvastatin (LIPITOR) 80 MG tablet TAKE 1 Tablet BY MOUTH ONCE EVERY DAY   cyclobenzaprine (FLEXERIL) 10 MG tablet Take 1 tablet (10 mg total) by mouth 3 (three) times daily as needed for muscle spasms.   furosemide (LASIX) 20 MG tablet Take 1 tablet (20 mg total) by mouth daily.   gabapentin (NEURONTIN) 100 MG capsule Take 1 capsule (100 mg total) by mouth 3 (three) times daily.   glucose blood (TRUE METRIX BLOOD GLUCOSE TEST) test strip Patient is to use 1 test strip to monitor blood glucose 4 times daily .Use as instructed   glucose blood test strip Use as instructed to monitor glucose 4 times daily  Insulin Pen Needle (PEN NEEDLES) 31G X 8 MM MISC Use to inject insulin 4 times daily   lisinopril (ZESTRIL) 20 MG tablet TAKE 1 Tablet BY MOUTH ONCE EVERY DAY (Patient taking differently: Take 20 mg by mouth daily.)   metoCLOPramide (REGLAN) 10 MG tablet Take 1 tablet (10 mg total) by mouth every 8 (eight) hours as needed for nausea.   metoprolol tartrate (LOPRESSOR) 100 MG tablet TAKE 1 Tablet  BY MOUTH TWICE DAILY   naproxen (NAPROSYN) 500 MG tablet Take 1 tablet (500 mg total) by mouth 2 (two) times daily.   nystatin cream (MYCOSTATIN) Apply 1 application. topically 2 (two) times daily. To penile irritation.   oxyCODONE-acetaminophen (PERCOCET) 10-325 MG tablet Take 1 tablet by mouth every 4 (four) hours as needed for pain.   sildenafil (VIAGRA) 100 MG tablet Take 1 tablet (100 mg total) by mouth daily as needed for erectile dysfunction.   SYRINGE-NEEDLE, DISP, 3 ML 22G X 1" 3 ML MISC Use to inject testosterone   testosterone cypionate (DEPOTESTOSTERONE CYPIONATE) 200 MG/ML injection Inject 0.5 mLs (100 mg total) into the muscle every 7 (seven) days.   [DISCONTINUED] Continuous  Glucose Sensor (DEXCOM G7 SENSOR) MISC Inject 1 Application into the skin as directed. Change sensor every 10 days as directed.   [DISCONTINUED] Dulaglutide (TRULICITY) 4.5 MG/0.5ML SOAJ Inject 4.5 mg as directed once a week.   [DISCONTINUED] insulin aspart (NOVOLOG FLEXPEN) 100 UNIT/ML FlexPen Inject 10-16 Units into the skin 3 (three) times daily with meals.   [DISCONTINUED] metFORMIN (GLUCOPHAGE-XR) 500 MG 24 hr tablet Take 2 tablets (1,000 mg total) by mouth daily with breakfast.   Continuous Glucose Sensor (DEXCOM G7 SENSOR) MISC Inject 1 Application into the skin as directed. Change sensor every 10 days as directed.   Dulaglutide (TRULICITY) 4.5 MG/0.5ML SOAJ Inject 4.5 mg as directed once a week.   insulin aspart (NOVOLOG FLEXPEN) 100 UNIT/ML FlexPen Inject 10-16 Units into the skin 3 (three) times daily with meals.   insulin glargine (LANTUS) 100 UNIT/ML injection Inject 0.4 mLs (40 Units total) into the skin daily.   metFORMIN (GLUCOPHAGE-XR) 500 MG 24 hr tablet Take 2 tablets (1,000 mg total) by mouth daily with breakfast.   NEEDLE, DISP, 18 G 18G X 1-1/2" MISC Use to draw up testosterone for injection   [DISCONTINUED] doxycycline (VIBRA-TABS) 100 MG tablet Take 100 mg by mouth 2 (two) times daily. (Patient not taking: Reported on 11/29/2023)   [DISCONTINUED] oxyCODONE-acetaminophen (PERCOCET) 10-325 MG tablet Take 1 tablet by mouth every 4 (four) hours as needed for pain.   No facility-administered encounter medications on file as of 11/29/2023.    ALLERGIES: Allergies  Allergen Reactions   Wound Dressing Adhesive     Hives, itching- noted with the G 7 Dexcom Sensor    VACCINATION STATUS: Immunization History  Administered Date(s) Administered   Influenza Inj Mdck Quad Pf 06/13/2019   Influenza,inj,Quad PF,6+ Mos 06/30/2017, 07/08/2018   Moderna Sars-Covid-2 Vaccination 04/01/2020, 04/29/2020    Diabetes He presents for his follow-up diabetic visit. He has type 2 diabetes  mellitus. Onset time: He was diagnosed at approximate age of 49 years. His disease course has been improving. Pertinent negatives for hypoglycemia include no confusion, headaches, pallor, seizures, sweats or tremors. Associated symptoms include fatigue. Pertinent negatives for diabetes include no blurred vision, no chest pain, no polydipsia, no polyphagia, no polyuria, no weakness and no weight loss. There are no hypoglycemic complications. Symptoms are improving. There are no diabetic complications. Risk factors for coronary  artery disease include dyslipidemia, diabetes mellitus, hypertension, male sex, obesity and sedentary lifestyle. Current diabetic treatment includes intensive insulin program and oral agent (monotherapy) (and Trulicity). He is compliant with treatment most of the time. His weight is fluctuating minimally. He is following a generally unhealthy diet. When asked about meal planning, he reported none. He has not had a previous visit with a dietitian. He never participates in exercise. His home blood glucose trend is decreasing steadily. His overall blood glucose range is 140-180 mg/dl. (He presents today with his CGM showing mostly at goal glycemic profile.  His POCT A1c today is 8.3%, increasing from last visit of 7.8%.  Analysis of his CGM shows TIR 62%, TAR 36%, TBR 2% with a GMI of 7.3%.  He denies any significant hypoglycemia.  He notes he has several dexcom sensors fail on him.) An ACE inhibitor/angiotensin II receptor blocker is being taken. He does not see a podiatrist.Eye exam is current.  Hyperlipidemia This is a chronic problem. The current episode started more than 1 year ago. The problem is uncontrolled. Recent lipid tests were reviewed and are high. Exacerbating diseases include diabetes and obesity. Factors aggravating his hyperlipidemia include beta blockers and fatty foods. Pertinent negatives include no chest pain, myalgias or shortness of breath. Current antihyperlipidemic  treatment includes statins. The current treatment provides moderate improvement of lipids. Compliance problems include adherence to diet, adherence to exercise and medication cost.  Risk factors for coronary artery disease include dyslipidemia, diabetes mellitus, family history, hypertension, male sex, obesity and a sedentary lifestyle.  Hypertension This is a chronic problem. The current episode started more than 1 year ago. The problem has been resolved since onset. The problem is controlled. Pertinent negatives include no blurred vision, chest pain, headaches, malaise/fatigue, neck pain, palpitations, shortness of breath or sweats. There are no associated agents to hypertension. Risk factors for coronary artery disease include dyslipidemia, diabetes mellitus, male gender, obesity and sedentary lifestyle. Past treatments include ACE inhibitors, beta blockers, calcium channel blockers and diuretics. The current treatment provides mild improvement. Compliance problems include medication cost, diet and exercise.     Review of systems  Constitutional: + fluctuating body weight,  current Body mass index is 52 kg/m., + fatigue, no subjective hyperthermia, no subjective hypothermia Eyes: + blurry vision, no xerophthalmia ENT: no sore throat, no nodules palpated in throat, no dysphagia/odynophagia, no hoarseness Cardiovascular: no chest pain, no shortness of breath, no palpitations, no leg swelling Respiratory: no cough, no shortness of breath Gastrointestinal: no nausea/vomiting/diarrhea Musculoskeletal: + generalized muscle/joint aches Skin: no rashes, no hyperemia Neurological: no tremors, no numbness, no tingling, no dizziness Psychiatric: no depression, no anxiety    Objective:    BP 128/76 (BP Location: Right Arm, Patient Position: Sitting, Cuff Size: Large)   Ht 5\' 8"  (1.727 m)   Wt (!) 342 lb (155.1 kg)   BMI 52.00 kg/m   Wt Readings from Last 3 Encounters:  11/29/23 (!) 342 lb (155.1  kg)  09/14/23 (!) 330 lb (149.7 kg)  07/21/23 (!) 328 lb 3.2 oz (148.9 kg)    BP Readings from Last 3 Encounters:  11/29/23 128/76  09/14/23 (!) 153/91  07/21/23 137/76     Physical Exam- Limited  Constitutional:  Body mass index is 52 kg/m. , not in acute distress, normal state of mind Eyes:  EOMI, no exophthalmos Musculoskeletal: no gross deformities, strength intact in all four extremities, no gross restriction of joint movements Skin:  no rashes, no hyperemia Neurological: no tremor with  outstretched hands   CMP ( most recent) CMP     Component Value Date/Time   NA 137 11/10/2022 1833   K 4.2 11/10/2022 1833   CL 99 11/10/2022 1833   CO2 29 11/10/2022 1819   GLUCOSE 131 (H) 11/10/2022 1833   BUN 8 11/10/2022 1833   CREATININE 0.70 11/10/2022 1833   CREATININE 1.42 (H) 09/08/2017 1540   CALCIUM 8.6 (L) 11/10/2022 1819   PROT 7.6 11/10/2022 1819   ALBUMIN 3.3 (L) 11/10/2022 1819   AST 14 (L) 11/10/2022 1819   ALT 16 11/10/2022 1819   ALKPHOS 102 11/10/2022 1819   BILITOT 0.1 (L) 11/10/2022 1819   GFRNONAA >60 11/10/2022 1819   GFRAA >60 03/17/2020 1200     Diabetic Labs (most recent): Lab Results  Component Value Date   HGBA1C 8.3 (A) 11/29/2023   HGBA1C 7.8 (A) 07/21/2023   HGBA1C 7.8 (A) 04/18/2023   MICROALBUR 7.2 (H) 07/19/2022   MICROALBUR <3.0 (H) 07/07/2021   MICROALBUR 80 02/26/2021     Lipid Panel ( most recent) Lipid Panel     Component Value Date/Time   CHOL 263 (H) 07/19/2022 1337   TRIG 77 07/19/2022 1337   HDL 62 07/19/2022 1337   CHOLHDL 4.2 07/19/2022 1337   VLDL 15 07/19/2022 1337   LDLCALC 186 (H) 07/19/2022 1337      Lab Results  Component Value Date   TSH 0.532 06/28/2017       Assessment & Plan:   1) Uncontrolled type 2 diabetes mellitus with hyperglycemia (HCC)  - [name] has currently uncontrolled symptomatic type 2 DM since  58 years of age.  He presents today with his CGM showing mostly at goal glycemic  profile.  His POCT A1c today is 8.3%, increasing from last visit of 7.8%.  Analysis of his CGM shows TIR 62%, TAR 36%, TBR 2% with a GMI of 7.3%.  He denies any significant hypoglycemia.  He notes he has several dexcom sensors fail on him.  -Recent labs reviewed.  - I had a long discussion with him about the progressive nature of diabetes and the pathology behind its complications. -his diabetes is complicated by obesity/sedentary life, inadequate insurance coverage,  And he remains at a high risk for more acute and chronic complications which include CAD, CVA, CKD, retinopathy, and neuropathy. These are all discussed in detail with him.  - Nutritional counseling repeated at each appointment due to patients tendency to fall back in to old habits.  - The patient admits there is a room for improvement in their diet and drink choices. -  Suggestion is made for the patient to avoid simple carbohydrates from their diet including Cakes, Sweet Desserts / Pastries, Ice Cream, Soda (diet and regular), Sweet Tea, Candies, Chips, Cookies, Sweet Pastries, Store Bought Juices, Alcohol in Excess of 1-2 drinks a day, Artificial Sweeteners, Coffee Creamer, and "Sugar-free" Products. This will help patient to have stable blood glucose profile and potentially avoid unintended weight gain.   - I encouraged the patient to switch to unprocessed or minimally processed complex starch and increased protein intake (animal or plant source), fruits, and vegetables.   - Patient is advised to stick to a routine mealtimes to eat 3 meals a day and avoid unnecessary snacks (to snack only to correct hypoglycemia).  - he will be scheduled with Norm Salt, RDN, CDE for diabetes education.  - I have approached him with the following individualized plan to manage  his diabetes and patient  agrees:   -Based on his stable, mostly at target glycemic profile, no changes will be made to his medications today. He is advised to continue  Lantus 40 units SQ nightly, continue Novolog 10-16 units TID with meals if glucose is above 90 and he is eating (Specific instructions on how to titrate insulin dosage based on glucose readings given to patient in writing), continue Metformin 1000 mg ER PO daily with breakfast, and Trulicity 4.5 mg SQ weekly.    He is encouraged consistently monitor blood glucose 4 times per day, before meals and at bedtime and report to the clinic if readings are less than 70 or greater than 300 for 3 tests in a row.  He can benefit from CGM device given his MDI and improving glycemic profile.  This will be important in preventing hypoglycemia.  I did encourage him to reach out to [facility] if his sensors fail before the 10 day mark so that a replacement can be sent to him.  - Specific targets for  A1c;  LDL, HDL,  and Triglycerides were discussed with the patient.  2) Blood Pressure /Hypertension:   His blood pressure is controlled to target.  He is advised to continue Norvasc 5 mg po daily, Lasix 20 mg po daily, Lisinopril 20 mg po daily, and Metoprolol 100 mg po twice daily.    3) Lipids/Hyperlipidemia:   His recent lipid panel from 07/19/22 shows uncontrolled LDL of 186.  He is advised to continue Atorvastatin 40 mg po daily at bedtime.  Side effects and precautions discussed with him.  He has annual physical PCP in September, I asked he have any labs sent here for our records.  He also has follow up with his PCP in April.  4)  Weight/Diet:  His Body mass index is 52 kg/m.-   clearly complicating his diabetes care.   he is  a candidate for modest weight loss. I discussed with him the fact that loss of 5 - 10% of his  current body weight will have the most impact on his diabetes management.  Exercise, and detailed carbohydrates information provided  -  detailed on discharge instructions.  He is a good candidate for bariatric surgery, however at this point he has no insurance.  5) Chronic Care/Health  Maintenance: -he is on ACEI/ARB and Statin medications and is encouraged to initiate and continue to follow up with Ophthalmology, Dentist,  Podiatrist at least yearly or according to recommendations, and advised to stay away from smoking. I have recommended yearly flu vaccine and pneumonia vaccine at least every 5 years; moderate intensity exercise for up to 150 minutes weekly; and  sleep for at least 7 hours a day.  - he is advised to maintain close follow up with Mirna Mires, MD for primary care needs, as well as his other providers for optimal and coordinated care.     I spent  43  minutes in the care of the patient today including review of labs from CMP, Lipids, Thyroid Function, Hematology (current and previous including abstractions from other facilities); face-to-face time discussing  his blood glucose readings/logs, discussing hypoglycemia and hyperglycemia episodes and symptoms, medications doses, his options of short and long term treatment based on the latest standards of care / guidelines;  discussion about incorporating lifestyle medicine;  and documenting the encounter. Risk reduction counseling performed per USPSTF guidelines to reduce obesity and cardiovascular risk factors.     Please refer to Patient Instructions for Blood Glucose Monitoring and Insulin/Medications  Dosing Guide"  in media tab for additional information. Please  also refer to " Patient Self Inventory" in the Media  tab for reviewed elements of pertinent patient history.  [name] participated in the discussions, expressed understanding, and voiced agreement with the above plans.  All questions were answered to his satisfaction. he is encouraged to contact clinic should he have any questions or concerns prior to his return visit.   Follow up plan: - Return in about 4 months (around 03/30/2024) for Diabetes F/U with A1c in office, No previsit labs, Bring meter and logs.  Ronny Bacon, [facility]  [facility] [address] [place], [place] [address] Phone: [phone] Fax: [phone]  11/29/2023, 1:52 PM

## 2023-12-05 ENCOUNTER — Telehealth: Payer: Self-pay | Admitting: Orthopaedic Surgery

## 2023-12-05 MED ORDER — OXYCODONE-ACETAMINOPHEN 10-325 MG PO TABS
1.0000 | ORAL_TABLET | ORAL | 0 refills | Status: DC | PRN
Start: 2023-12-05 — End: 2023-12-12

## 2023-12-05 NOTE — Telephone Encounter (Signed)
 Dr. Sanjuan Dame pt - pt lvm requesting a refill on Oxycodone 10-325, 30 tablets, every 4 hours PRN for pain to be sent to [facility]

## 2023-12-08 ENCOUNTER — Ambulatory Visit: Payer: Medicaid Other | Admitting: Urology

## 2023-12-10 ENCOUNTER — Other Ambulatory Visit: Payer: Self-pay | Admitting: Nurse Practitioner

## 2023-12-12 ENCOUNTER — Telehealth: Payer: Self-pay | Admitting: Orthopaedic Surgery

## 2023-12-12 MED ORDER — OXYCODONE-ACETAMINOPHEN 10-325 MG PO TABS: 1.0000 | ORAL_TABLET | ORAL | 0 refills | Status: DC | PRN

## 2023-12-12 NOTE — Telephone Encounter (Signed)
 Dr. Sanjuan Dame pt - spoke w/the pt, he is requesting a refill on Oxycodone 10-325 to be sent to [facility]

## 2023-12-14 ENCOUNTER — Ambulatory Visit: Payer: Medicaid Other | Admitting: Orthopaedic Surgery

## 2023-12-14 ENCOUNTER — Encounter: Payer: Self-pay | Admitting: Orthopaedic Surgery

## 2023-12-14 ENCOUNTER — Ambulatory Visit: Admitting: Orthopaedic Surgery

## 2023-12-14 VITALS — BP 146/86 | HR 74 | Ht 68.0 in | Wt 342.0 lb

## 2023-12-14 DIAGNOSIS — M545 Low back pain, unspecified: Secondary | ICD-10-CM

## 2023-12-14 DIAGNOSIS — Z6841 Body Mass Index (BMI) 40.0 and over, adult: Secondary | ICD-10-CM

## 2023-12-14 NOTE — Progress Notes (Signed)
 My back hurts.  He has had more back pain with the sudden freezing temperatures.  He has no new trauma.  He tries to be active.  He has no weakness.  He is taking his medicine.  Lower back is diffusely tender.  ROM is decreased secondary to pain.  NV intact.  SLR negative,  Muscle tone and strength normal.  Encounter Diagnoses  Name Primary?   Lumbar pain Yes   Morbid obesity due to excess calories (HCC)    Body mass index 50.0-59.9, adult (HCC)    I refilled his medicine yesterday.  Return in three months.  Call if any problem.  Precautions discussed.  Electronically Signed Darreld Mclean, MD 4/9/20252:16 PM

## 2023-12-15 ENCOUNTER — Telehealth: Payer: Self-pay | Admitting: Pharmacy Technician

## 2023-12-15 ENCOUNTER — Other Ambulatory Visit (HOSPITAL_COMMUNITY): Payer: Self-pay

## 2023-12-15 NOTE — Telephone Encounter (Signed)
 Pharmacy Patient Advocate Encounter   Received notification from CoverMyMeds that prior authorization for [facility] G7 Receiver device is required/requested.   Insurance verification completed.   The patient is insured through [facility] .   Per test claim: PA required; PA submitted to above mentioned insurance via CoverMyMeds Key/confirmation #/EOC [id number] Status is pending

## 2023-12-15 NOTE — Telephone Encounter (Signed)
 Pharmacy Patient Advocate Encounter  Received notification from [facility] that Prior Authorization for [facility] G7 Receiver device has been APPROVED from 12/15/2023 to 12/14/2024. Ran test claim, Copay is $0.00. This test claim was processed through [facility]- copay amounts may vary at other pharmacies due to pharmacy/plan contracts, or as the patient moves through the different stages of their insurance plan.   PA #/Case ID/Reference #: [id number]

## 2023-12-19 ENCOUNTER — Telehealth: Payer: Self-pay | Admitting: Orthopaedic Surgery

## 2023-12-19 MED ORDER — OXYCODONE-ACETAMINOPHEN 10-325 MG PO TABS
1.0000 | ORAL_TABLET | ORAL | 0 refills | Status: DC | PRN
Start: 2023-12-19 — End: 2023-12-26

## 2023-12-19 NOTE — Telephone Encounter (Signed)
 Dr. Vicente Graham pt - spoke w/the pt, he is requesting a refill for Oxycodone 10-325 to be sent to [facility].

## 2023-12-20 ENCOUNTER — Encounter: Payer: Self-pay | Admitting: Neurology

## 2023-12-20 ENCOUNTER — Ambulatory Visit: Payer: Medicaid Other | Admitting: Neurology

## 2023-12-20 VITALS — BP 148/88 | HR 80 | Ht 68.0 in | Wt 344.0 lb

## 2023-12-20 DIAGNOSIS — Z7985 Long-term (current) use of injectable non-insulin antidiabetic drugs: Secondary | ICD-10-CM

## 2023-12-20 DIAGNOSIS — M792 Neuralgia and neuritis, unspecified: Secondary | ICD-10-CM | POA: Diagnosis not present

## 2023-12-20 DIAGNOSIS — E1142 Type 2 diabetes mellitus with diabetic polyneuropathy: Secondary | ICD-10-CM | POA: Insufficient documentation

## 2023-12-20 MED ORDER — DULOXETINE HCL 60 MG PO CPEP: 60.0000 mg | ORAL_CAPSULE | Freq: Every day | ORAL | 6 refills | Status: DC

## 2023-12-20 MED ORDER — GABAPENTIN 300 MG PO CAPS: 300.0000 mg | ORAL_CAPSULE | Freq: Three times a day (TID) | ORAL | 6 refills | Status: DC

## 2023-12-20 NOTE — Progress Notes (Signed)
 Chief Complaint  Patient presents with   New Patient (Initial Visit)    Pt in 14, here alone  Pt is referred for peripheral neuropathy.       ASSESSMENT AND PLAN  [name] is a 58 y.o. male   Poorly controlled insulin-dependent diabetes, Evidence of diabetic neuropathy, likely small fiber neuropathy Neuropathic pain laboratory evaluation to rule out other treatable etiology for peripheral neuropathy Higher dose of gabapentin up to 300 mg 3 times a day, higher dose of Cymbalta 60 mg every morning  Will inform his lab result, he is continue follow-up with his primary care  DIAGNOSTIC DATA (LABS, IMAGING, TESTING) - I reviewed patient records, labs, notes, testing and imaging myself where available.   MEDICAL HISTORY:  [name], is a 58 year old male seen in request by his primary care Dr Mirna Mires, for evaluation of neuropathy, initial evaluation December 20, 2023  History is obtained from the patient and review of electronic medical records. I personally reviewed pertinent available imaging films in PACS.   PMHx of  DM-insulin dependent since 2012. HTN HLD Obesity. Gout  He had more than a decade history of diabetes, was not under good control, A1c in November was 14.5, most recent 1 was 8.3  Since 2024 he noticed bilateral toes numbness tingling, stinging pain difficulty falling to sleep, gait abnormality also related to his chronic low back pain, obesity  He denied fingertips paresthesia  He works at home care, describe some difficulties,  He was given gabapentin low-dose 100 mg every night, with limited help  PHYSICAL EXAM:   Vitals:   12/20/23 1046 12/20/23 1051  BP: (!) 166/94 (!) 148/88  Pulse: 76 80  Weight: (!) 344 lb (156 kg)   Height: 5\' 8"  (1.727 m)    Not recorded     Body mass index is 52.31 kg/m.  PHYSICAL EXAMNIATION:  Gen: NAD, conversant, well nourised, well groomed                     Cardiovascular: Regular rate rhythm, no  peripheral edema, warm, nontender. Eyes: Conjunctivae clear without exudates or hemorrhage Neck: Supple, no carotid bruits. Pulmonary: Clear to auscultation bilaterally   NEUROLOGICAL EXAM:  MENTAL STATUS: Speech/cognition: Awake, alert, oriented to history taking and casual conversation CRANIAL NERVES: CN II: Visual fields are full to confrontation. Pupils are round equal and briskly reactive to light. CN III, IV, VI: extraocular movement are normal. No ptosis. CN V: Facial sensation is intact to light touch CN VII: Face is symmetric with normal eye closure  CN VIII: Hearing is normal to causal conversation. CN IX, X: Phonation is normal. CN XI: Head turning and shoulder shrug are intact  MOTOR: There is no pronator drift of out-stretched arms. Muscle bulk and tone are normal. Muscle strength is normal.  REFLEXES: Reflexes are 1 and symmetric at the biceps, triceps, knees, and absent at ankles. Plantar responses are flexor.  SENSORY: Intact to light touch, pinprick and vibratory sensation are intact in fingers and toes.  COORDINATION: There is no trunk or limb dysmetria noted.  GAIT/STANCE: Push-up to get up from seated position, limited by his big body habitus  REVIEW OF SYSTEMS:  Full 14 system review of systems performed and notable only for as above All other review of systems were negative.   ALLERGIES: Allergies  Allergen Reactions   Wound Dressing Adhesive     Hives, itching- noted with the G 7 Dexcom Sensor  HOME MEDICATIONS: Current Outpatient Medications  Medication Sig Dispense Refill   amLODipine (NORVASC) 10 MG tablet TAKE 1 Tablet BY MOUTH ONCE EVERY DAY (Patient taking differently: Take 10 mg by mouth daily.) 90 tablet 0   atorvastatin (LIPITOR) 80 MG tablet TAKE 1 Tablet BY MOUTH ONCE EVERY DAY 90 tablet 0   Continuous Glucose Receiver (DEXCOM G7 RECEIVER) DEVI Use to monitor glucose continuously as directed. 1 each 0   Continuous Glucose Sensor  (DEXCOM G7 SENSOR) MISC Inject 1 Application into the skin as directed. Change sensor every 10 days as directed. 9 each 3   Dulaglutide (TRULICITY) 4.5 MG/0.5ML SOAJ Inject 4.5 mg as directed once a week. 3 mL 3   furosemide (LASIX) 20 MG tablet Take 1 tablet (20 mg total) by mouth daily. 30 tablet 1   gabapentin (NEURONTIN) 100 MG capsule Take 1 capsule (100 mg total) by mouth 3 (three) times daily. 270 capsule 3   glucose blood (TRUE METRIX BLOOD GLUCOSE TEST) test strip Patient is to use 1 test strip to monitor blood glucose 4 times daily .Use as instructed 100 each 12   glucose blood test strip Use as instructed to monitor glucose 4 times daily 100 each 12   insulin aspart (NOVOLOG FLEXPEN) 100 UNIT/ML FlexPen Inject 10-16 Units into the skin 3 (three) times daily with meals. 45 mL 3   insulin glargine (LANTUS) 100 UNIT/ML injection Inject 0.4 mLs (40 Units total) into the skin daily. 36 mL 3   Insulin Pen Needle (PEN NEEDLES) 31G X 8 MM MISC Use to inject insulin 4 times daily 300 each 6   lisinopril (ZESTRIL) 20 MG tablet TAKE 1 Tablet BY MOUTH ONCE EVERY DAY (Patient taking differently: Take 20 mg by mouth daily.) 90 tablet 0   metFORMIN (GLUCOPHAGE-XR) 500 MG 24 hr tablet Take 2 tablets (1,000 mg total) by mouth daily with breakfast. 180 tablet 3   metoCLOPramide (REGLAN) 10 MG tablet Take 1 tablet (10 mg total) by mouth every 8 (eight) hours as needed for nausea. 30 tablet 0   naproxen (NAPROSYN) 500 MG tablet Take 1 tablet (500 mg total) by mouth 2 (two) times daily. 20 tablet 0   NEEDLE, DISP, 18 G 18G X 1-1/2" MISC Use to draw up testosterone for injection 2 each 12   nystatin cream (MYCOSTATIN) Apply 1 application. topically 2 (two) times daily. To penile irritation. 30 g 3   oxyCODONE-acetaminophen (PERCOCET) 10-325 MG tablet Take 1 tablet by mouth every 4 (four) hours as needed for pain. 30 tablet 0   sildenafil (VIAGRA) 100 MG tablet Take 1 tablet (100 mg total) by mouth daily as  needed for erectile dysfunction. 30 tablet 11   SYRINGE-NEEDLE, DISP, 3 ML 22G X 1" 3 ML MISC Use to inject testosterone 2 each 12   testosterone cypionate (DEPOTESTOSTERONE CYPIONATE) 200 MG/ML injection Inject 0.5 mLs (100 mg total) into the muscle every 7 (seven) days. 10 mL 1   allopurinol (ZYLOPRIM) 300 MG tablet Take 1 tablet (300 mg total) by mouth daily. (Patient not taking: Reported on 12/20/2023) 30 tablet 5   No current facility-administered medications for this visit.    PAST MEDICAL HISTORY: Past Medical History:  Diagnosis Date   Arthritis    Diabetes mellitus without complication (HCC)    diagnosed about age 58   Gout    Hypertension    boarderline    PAST SURGICAL HISTORY: Past Surgical History:  Procedure Laterality Date   HERNIA REPAIR  2008  MASS EXCISION Left age 24   L thigh- benign    FAMILY HISTORY: Family History  Problem Relation Age of Onset   Cancer Mother    Diabetes Mother    Heart failure Father    Hypertension Father    Diabetes Father    Heart attack Father     SOCIAL HISTORY: Social History   Socioeconomic History   Marital status: Single    Spouse name: Not on file   Number of children: Not on file   Years of education: Not on file   Highest education level: Not on file  Occupational History   Not on file  Tobacco Use   Smoking status: Former    Current packs/day: 0.00    Average packs/day: 0.5 packs/day for 5.0 years (2.5 ttl pk-yrs)    Types: Cigarettes    Start date: 37    Quit date: 38    Years since quitting: 35.3   Smokeless tobacco: Never  Vaping Use   Vaping status: Never Used  Substance and Sexual Activity   Alcohol use: No   Drug use: No   Sexual activity: Not on file  Other Topics Concern   Not on file  Social History Narrative   Not on file   Social Drivers of Health   Financial Resource Strain: Not on file  Food Insecurity: Not on file  Transportation Needs: Not on file  Physical Activity: Not  on file  Stress: Not on file  Social Connections: Unknown (02/09/2023)   Received from [facility], [facility]   Social Network    Social Network: Not on file  Intimate Partner Violence: Unknown (02/09/2023)   Received from [employer], [facility]   HITS    Physically Hurt: Not on file    Insult or Talk Down To: Not on file    Threaten Physical Harm: Not on file    Scream or Curse: Not on file      Phebe Brasil, M.D. Ph.D.  [facility] [address] [place], [place] [address] Ph: [phone] Fax: [phone]  CC:  Pleasant Brilliant, MD [address] [place],  [place] [address]  Wilburn Handler, MD

## 2023-12-21 ENCOUNTER — Ambulatory Visit: Admitting: Orthopaedic Surgery

## 2023-12-22 LAB — CBC WITH DIFFERENTIAL/PLATELET
Basophils Absolute: 0.1 10*3/uL (ref 0.0–0.2)
Basos: 1 %
EOS (ABSOLUTE): 0.1 10*3/uL (ref 0.0–0.4)
Eos: 1 %
Hematocrit: 48.8 % (ref 37.5–51.0)
Hemoglobin: 15.8 g/dL (ref 13.0–17.7)
Immature Grans (Abs): 0.1 10*3/uL (ref 0.0–0.1)
Immature Granulocytes: 1 %
Lymphocytes Absolute: 2.6 10*3/uL (ref 0.7–3.1)
Lymphs: 26 %
MCH: 27.9 pg (ref 26.6–33.0)
MCHC: 32.4 g/dL (ref 31.5–35.7)
MCV: 86 fL (ref 79–97)
Monocytes Absolute: 1 10*3/uL — ABNORMAL HIGH (ref 0.1–0.9)
Monocytes: 10 %
Neutrophils Absolute: 6.4 10*3/uL (ref 1.4–7.0)
Neutrophils: 61 %
Platelets: 266 10*3/uL (ref 150–450)
RBC: 5.67 x10E6/uL (ref 4.14–5.80)
RDW: 13.5 % (ref 11.6–15.4)
WBC: 10.2 10*3/uL (ref 3.4–10.8)

## 2023-12-22 LAB — C-REACTIVE PROTEIN: CRP: 13 mg/L — ABNORMAL HIGH (ref 0–10)

## 2023-12-22 LAB — COMPREHENSIVE METABOLIC PANEL WITH GFR
ALT: 24 IU/L (ref 0–44)
AST: 15 IU/L (ref 0–40)
Albumin: 4.1 g/dL (ref 3.8–4.9)
Alkaline Phosphatase: 158 IU/L — ABNORMAL HIGH (ref 44–121)
BUN/Creatinine Ratio: 9 (ref 9–20)
BUN: 8 mg/dL (ref 6–24)
Bilirubin Total: 0.4 mg/dL (ref 0.0–1.2)
CO2: 23 mmol/L (ref 20–29)
Calcium: 9.6 mg/dL (ref 8.7–10.2)
Chloride: 104 mmol/L (ref 96–106)
Creatinine, Ser: 0.87 mg/dL (ref 0.76–1.27)
Globulin, Total: 2.9 g/dL (ref 1.5–4.5)
Glucose: 66 mg/dL — ABNORMAL LOW (ref 70–99)
Potassium: 4.7 mmol/L (ref 3.5–5.2)
Sodium: 141 mmol/L (ref 134–144)
Total Protein: 7 g/dL (ref 6.0–8.5)
eGFR: 100 mL/min/{1.73_m2} (ref >59)

## 2023-12-22 LAB — VITAMIN B12: Vitamin B-12: 325 pg/mL (ref 232–1245)

## 2023-12-22 LAB — MULTIPLE MYELOMA PANEL, SERUM
Albumin SerPl Elph-Mcnc: 3.8 g/dL (ref 2.9–4.4)
Albumin/Glob SerPl: 1.2 (ref 0.7–1.7)
Alpha 1: 0.2 g/dL (ref 0.0–0.4)
Alpha2 Glob SerPl Elph-Mcnc: 0.6 g/dL (ref 0.4–1.0)
B-Globulin SerPl Elph-Mcnc: 1.4 g/dL — ABNORMAL HIGH (ref 0.7–1.3)
Gamma Glob SerPl Elph-Mcnc: 1 g/dL (ref 0.4–1.8)
Globulin, Total: 3.2 g/dL (ref 2.2–3.9)
IgA/Immunoglobulin A, Serum: 546 mg/dL — ABNORMAL HIGH (ref 90–386)
IgG (Immunoglobin G), Serum: 1213 mg/dL (ref 603–1613)
IgM (Immunoglobulin M), Srm: 98 mg/dL (ref 20–172)

## 2023-12-22 LAB — TSH: TSH: 0.922 u[IU]/mL (ref 0.450–4.500)

## 2023-12-22 LAB — HGB A1C W/O EAG: Hgb A1c MFr Bld: 8 % — ABNORMAL HIGH (ref 4.8–5.6)

## 2023-12-22 LAB — SEDIMENTATION RATE: Sed Rate: 27 mm/h (ref 0–30)

## 2023-12-22 LAB — ANA W/REFLEX IF POSITIVE: Anti Nuclear Antibody (ANA): NEGATIVE

## 2023-12-22 LAB — RPR: RPR Ser Ql: NONREACTIVE

## 2023-12-22 LAB — CK: Total CK: 61 U/L (ref 41–331)

## 2023-12-22 LAB — FOLATE: Folate: 3.7 ng/mL (ref >3.0)

## 2023-12-26 ENCOUNTER — Telehealth: Payer: Self-pay | Admitting: Orthopaedic Surgery

## 2023-12-26 ENCOUNTER — Encounter: Payer: Self-pay | Admitting: Neurology

## 2023-12-26 MED ORDER — OXYCODONE-ACETAMINOPHEN 10-325 MG PO TABS: 1.0000 | ORAL_TABLET | ORAL | 0 refills | Status: DC | PRN

## 2023-12-26 NOTE — Telephone Encounter (Signed)
 Dr. Vicente Graham pt - spoke w/the pt, he is requesting a refill on Oxycodone  10-325mg  to be sent to [facility].

## 2023-12-26 NOTE — Addendum Note (Signed)
 Addended by: Milinda Allen on: 12/26/2023 12:09 PM   Modules accepted: Orders

## 2024-01-02 ENCOUNTER — Telehealth: Payer: Self-pay | Admitting: Orthopaedic Surgery

## 2024-01-02 MED ORDER — OXYCODONE-ACETAMINOPHEN 10-325 MG PO TABS: 1.0000 | ORAL_TABLET | ORAL | 0 refills | Status: DC | PRN

## 2024-01-02 NOTE — Telephone Encounter (Signed)
 Dr. Sanjuan Dame pt - pt lvm requesting a refill for Oxycodone 10-325 to be sent to [facility] [place]

## 2024-01-02 NOTE — Addendum Note (Signed)
 Addended by: Milinda Allen on: 01/02/2024 01:27 PM   Modules accepted: Orders

## 2024-01-03 ENCOUNTER — Other Ambulatory Visit (HOSPITAL_COMMUNITY)
Admission: RE | Admit: 2024-01-03 | Discharge: 2024-01-03 | Disposition: A | Discharge status: Home / Self Care | Source: Ambulatory Visit | Attending: Urology | Admitting: Urology

## 2024-01-03 DIAGNOSIS — E230 Hypopituitarism: Secondary | ICD-10-CM | POA: Insufficient documentation

## 2024-01-03 LAB — HEMOGLOBIN AND HEMATOCRIT, BLOOD
HCT: 51.9 % (ref 39.0–52.0)
Hemoglobin: 16.5 g/dL (ref 13.0–17.0)

## 2024-01-03 LAB — PSA: Prostatic Specific Antigen: 0.71 ng/mL (ref 0.00–4.00)

## 2024-01-05 ENCOUNTER — Ambulatory Visit: Payer: Medicaid Other | Admitting: Urology

## 2024-01-05 VITALS — BP 121/87 | HR 102

## 2024-01-05 DIAGNOSIS — N5201 Erectile dysfunction due to arterial insufficiency: Secondary | ICD-10-CM

## 2024-01-05 DIAGNOSIS — R351 Nocturia: Secondary | ICD-10-CM

## 2024-01-05 DIAGNOSIS — N4883 Acquired buried penis: Secondary | ICD-10-CM

## 2024-01-05 DIAGNOSIS — E230 Hypopituitarism: Secondary | ICD-10-CM

## 2024-01-05 DIAGNOSIS — Z87438 Personal history of other diseases of male genital organs: Secondary | ICD-10-CM

## 2024-01-05 DIAGNOSIS — N471 Phimosis: Secondary | ICD-10-CM

## 2024-01-05 DIAGNOSIS — E291 Testicular hypofunction: Secondary | ICD-10-CM

## 2024-01-05 DIAGNOSIS — N481 Balanitis: Secondary | ICD-10-CM

## 2024-01-05 LAB — MICROSCOPIC EXAMINATION
Bacteria, UA: NONE SEEN
RBC, Urine: NONE SEEN /HPF (ref 0–2)
WBC, UA: NONE SEEN /HPF (ref 0–5)

## 2024-01-05 LAB — URINALYSIS, ROUTINE W REFLEX MICROSCOPIC
Bilirubin, UA: NEGATIVE
Glucose, UA: NEGATIVE
Leukocytes,UA: NEGATIVE
Nitrite, UA: NEGATIVE
RBC, UA: NEGATIVE
Specific Gravity, UA: 1.025 (ref 1.005–1.030)
Urobilinogen, Ur: 1 mg/dL (ref 0.2–1.0)
pH, UA: 6 (ref 5.0–7.5)

## 2024-01-05 MED ORDER — TADALAFIL 5 MG PO TABS: 5.0000 mg | ORAL_TABLET | Freq: Every day | ORAL | 11 refills | Status: DC | PRN

## 2024-01-05 NOTE — Progress Notes (Signed)
 Subjective: 1. Hypogonadotropic hypogonadism (HCC)   2. Erectile dysfunction due to arterial insufficiency   3. Nocturia   4. Acquired buried penis   5. Phimosis   6. Balanitis     01/05/24: [name] returns today in f/u for his history below.  He is back on TRT and his Hgb is normal.  His PSA remains low.  The testosterone  level doesn't appear to have been done.  His IPSS is 17.  He has nocturia 3-4x and has some straining to void with a reduced stream.  His UA is celar.   10/4/24Jenine [name] returns today in f/u.  He stopped the TRT with the last dose about a month ago because of some swelling in his feet.  He had no calf pain.  He hasn't had any blood work prior to this visit.  He is voiding ok with an IPSS of 7.  He continues to have issues with ED but couldn't get the Sildenafil  because of cost but I don't [facility].  He has lost another 15 or so lb's.    4/4/24Jenine [name] returns today in f/u.  He has been losing weight on trulicity  and is down 100lb.  His buried penis and phimosis have continued to improved.  He has persistent ED but didn't get the sildenafil  but would still like to try that.  His testosterone  was 140 in 1/24. His  PSA was 0.27.   09/09/22: [name] returns today in f/u.  He has continued to lose some weight and is having less of an issue with the buried penis.  He has stable LUTS with an IPSS of 22 but he attributes most of his symptoms to his diabetes.  UA is ok today.   He has ED and would like to try Viagra  again.    5/18/23Jenine [name] returns today in f/u.  He has lost 30lb with diet but his glucose was 335 yesterday so he is still having frequency and nocturia.  He still has 3+ glucose in the urine today.   He has been using nystatin  cream and needs a refill.  He didn't get the testosterone  level that was ordered before and hasn't been able to get to [facility] to see the reconstructive urologist.    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  His PSA on 07/07/21 was stable at 0.26.   He is a very poorly controlled diabetic with a glucose of 539  and Hgb A1c of >15.5 on 09/28/21.  He hasn't had a recent testosterone  level.   He is concerned today about a retracted penis and having to sit to void.   He is losing weight.  He has frequency and urgency with nocturia that is probably secondary to polydipsia from the diabetes.   UA has 3+ glucose but is otherwise clear.  ROS:  Review of Systems  All other systems reviewed and are negative.   Allergies  Allergen Reactions   Wound Dressing Adhesive     Hives, itching- noted with the G 7 Dexcom Sensor    Past Medical History:  Diagnosis Date   Arthritis    Diabetes mellitus without complication (HCC)    diagnosed about age 50   Gout    Hypertension    boarderline    Past Surgical History:  Procedure Laterality Date   HERNIA REPAIR  2008   MASS EXCISION  Left age 53   L thigh- benign    Social History   Socioeconomic History   Marital status: Single    Spouse name: Not on file   Number of children: Not on file   Years of education: Not on file   Highest education level: Not on file  Occupational History   Not on file  Tobacco Use   Smoking status: Former    Current packs/day: 0.00    Average packs/day: 0.5 packs/day for 5.0 years (2.5 ttl pk-yrs)    Types: Cigarettes    Start date: 58    Quit date: 30    Years since quitting: 35.3   Smokeless tobacco: Never  Vaping Use   Vaping status: Never Used  Substance and Sexual Activity   Alcohol use: No   Drug use: No   Sexual activity: Not on file  Other Topics Concern   Not on file  Social History Narrative   Not on file   Social Drivers of Health   Financial Resource Strain: Not on file  Food Insecurity: Not on file  Transportation Needs: Not on file  Physical Activity: Not on file  Stress: Not on file  Social Connections: Unknown (02/09/2023)   Received  from [facility]   Social Network    Social Network: Not on file  Intimate Partner Violence: Unknown (02/09/2023)   Received from [facility]   HITS    Physically Hurt: Not on file    Insult or Talk Down To: Not on file    Threaten Physical Harm: Not on file    Scream or Curse: Not on file    Family History  Problem Relation Age of Onset   Cancer Mother    Diabetes Mother    Heart failure Father    Hypertension Father    Diabetes Father    Heart attack Father     Anti-infectives: Anti-infectives (From admission, onward)    None       Current Outpatient Medications  Medication Sig Dispense Refill   amLODipine  (NORVASC ) 10 MG tablet TAKE 1 Tablet BY MOUTH ONCE EVERY DAY (Patient taking differently: Take 10 mg by mouth daily.) 90 tablet 0   atorvastatin  (LIPITOR) 80 MG tablet TAKE 1 Tablet BY MOUTH ONCE EVERY DAY 90 tablet 0   Continuous Glucose Receiver (DEXCOM G7 RECEIVER) DEVI Use to monitor glucose continuously as directed. 1 each 0   Continuous Glucose Sensor (DEXCOM G7 SENSOR) MISC Inject 1 Application into the skin as directed. Change sensor every 10 days as directed. 9 each 3   Dulaglutide  (TRULICITY ) 4.5 MG/0.5ML SOAJ Inject 4.5 mg as directed once a week. 3 mL 3   DULoxetine  (CYMBALTA ) 60 MG capsule Take 1 capsule (60 mg total) by mouth daily. 30 capsule 6   furosemide  (LASIX ) 20 MG tablet Take 1 tablet (20 mg total) by mouth daily. 30 tablet 1   gabapentin  (NEURONTIN ) 300 MG capsule Take 1 capsule (300 mg total) by mouth 3 (three) times daily. 90 capsule 6   glucose blood (TRUE METRIX BLOOD GLUCOSE TEST) test strip Patient is to use 1 test strip to monitor blood glucose 4 times daily .Use as instructed 100 each 12   glucose blood test strip Use as instructed to monitor glucose 4 times daily 100 each 12   insulin  aspart (NOVOLOG  FLEXPEN) 100 UNIT/ML FlexPen Inject 10-16 Units into the skin 3 (three) times daily with meals. 45 mL 3    insulin  glargine (  LANTUS ) 100 UNIT/ML injection Inject 0.4 mLs (40 Units total) into the skin daily. 36 mL 3   Insulin  Pen Needle (PEN NEEDLES) 31G X 8 MM MISC Use to inject insulin  4 times daily 300 each 6   lisinopril  (ZESTRIL ) 20 MG tablet TAKE 1 Tablet BY MOUTH ONCE EVERY DAY (Patient taking differently: Take 20 mg by mouth daily.) 90 tablet 0   metFORMIN  (GLUCOPHAGE -XR) 500 MG 24 hr tablet Take 2 tablets (1,000 mg total) by mouth daily with breakfast. 180 tablet 3   metoCLOPramide  (REGLAN ) 10 MG tablet Take 1 tablet (10 mg total) by mouth every 8 (eight) hours as needed for nausea. 30 tablet 0   naproxen  (NAPROSYN ) 500 MG tablet Take 1 tablet (500 mg total) by mouth 2 (two) times daily. 20 tablet 0   NEEDLE, DISP, 18 G 18G X 1-1/2" MISC Use to draw up testosterone  for injection 2 each 12   nystatin  cream (MYCOSTATIN ) Apply 1 application. topically 2 (two) times daily. To penile irritation. 30 g 3   oxyCODONE -acetaminophen  (PERCOCET) 10-325 MG tablet Take 1 tablet by mouth every 4 (four) hours as needed for pain. 30 tablet 0   sildenafil  (VIAGRA ) 100 MG tablet Take 1 tablet (100 mg total) by mouth daily as needed for erectile dysfunction. 30 tablet 11   SYRINGE-NEEDLE, DISP, 3 ML 22G X 1" 3 ML MISC Use to inject testosterone  2 each 12   tadalafil  (CIALIS ) 5 MG tablet Take 1 tablet (5 mg total) by mouth daily as needed for erectile dysfunction. 30 tablet 11   testosterone  cypionate (DEPOTESTOSTERONE CYPIONATE) 200 MG/ML injection Inject 0.5 mLs (100 mg total) into the muscle every 7 (seven) days. 10 mL 1   allopurinol  (ZYLOPRIM ) 300 MG tablet Take 1 tablet (300 mg total) by mouth daily. (Patient not taking: Reported on 12/20/2023) 30 tablet 5   No current facility-administered medications for this visit.     Objective: Vital signs in last 24 hours: BP 121/87   Pulse (!) 102   Intake/Output from previous day: No intake/output data recorded. Intake/Output this  shift: @IOTHISSHIFT @   Physical Exam Vitals reviewed.  Constitutional:      Appearance: Normal appearance. He is obese.  Neurological:     Mental Status: He is alert.     Lab Results:  Results for orders placed or performed in visit on 01/05/24 (from the past 24 hours)  Urinalysis, Routine w reflex microscopic     Status: Abnormal   Collection Time: 01/05/24  2:15 PM  Result Value Ref Range   Specific Gravity, UA 1.025 1.005 - 1.030   pH, UA 6.0 5.0 - 7.5   Color, UA Yellow Yellow   Appearance Ur Clear Clear   Leukocytes,UA Negative Negative   Protein,UA 2+ (A) Negative/Trace   Glucose, UA Negative Negative   Ketones, UA Trace (A) Negative   RBC, UA Negative Negative   Bilirubin, UA Negative Negative   Urobilinogen, Ur 1.0 0.2 - 1.0 mg/dL   Nitrite, UA Negative Negative   Microscopic Examination See below:    Narrative   Performed at:  [id number] - [facility] [address], [place], [place]  [id number] Lab Director: Liliana Regulus MT, Phone:  [phone]  Microscopic Examination     Status: Abnormal   Collection Time: 01/05/24  2:15 PM   Urine  Result Value Ref Range   WBC, UA None seen 0 - 5 /hpf   RBC, Urine None seen 0 - 2 /hpf   Epithelial Cells (non  renal) 0-10 0 - 10 /hpf   Casts Present (A) None seen /lpf   Cast Type Granular casts (A) N/A   Mucus, UA Present (A) Not Estab.   Bacteria, UA None seen None seen/Few   Narrative   Performed at:  [address] - [facility] [place] [address] [place], [place]  [id number] Lab Director: Liliana Regulus MT, Phone:  [phone]          BMET No results for input(s): "NA", "K", "CL", "CO2", "GLUCOSE", "BUN", "CREATININE", "CALCIUM " in the last 72 hours. PT/INR No results for input(s): "LABPROT", "INR" in the last 72 hours. ABG No results for input(s): "PHART", "HCO3" in the last 72 hours.  Invalid input(s): "PCO2", "PO2" Recent Results (from the past 2160 hours)  HgB A1c     Status: Abnormal   Collection  Time: 11/29/23  1:40 PM  Result Value Ref Range   Hemoglobin A1C 8.3 (A) 4.0 - 5.6 %   HbA1c POC (<> result, manual entry)     HbA1c, POC (prediabetic range)     HbA1c, POC (controlled diabetic range)    Vitamin B12     Status: None   Collection Time: 12/20/23 11:29 AM  Result Value Ref Range   Vitamin B-12 325 232 - 1,245 pg/mL  RPR     Status: None   Collection Time: 12/20/23 11:29 AM  Result Value Ref Range   RPR Ser Ql Non Reactive Non Reactive  Folate     Status: None   Collection Time: 12/20/23 11:29 AM  Result Value Ref Range   Folate 3.7 >3.0 ng/mL    Comment: A serum folate concentration of less than 3.1 ng/mL is considered to represent clinical deficiency.   TSH     Status: None   Collection Time: 12/20/23 11:29 AM  Result Value Ref Range   TSH 0.922 0.450 - 4.500 uIU/mL  CK     Status: None   Collection Time: 12/20/23 11:29 AM  Result Value Ref Range   Total CK 61 41 - 331 U/L  Sedimentation rate     Status: None   Collection Time: 12/20/23 11:29 AM  Result Value Ref Range   Sed Rate 27 0 - 30 mm/hr  Hgb A1c w/o eAG     Status: Abnormal   Collection Time: 12/20/23 11:29 AM  Result Value Ref Range   Hgb A1c MFr Bld 8.0 (H) 4.8 - 5.6 %    Comment:          Prediabetes: 5.7 - 6.4          Diabetes: >6.4          Glycemic control for adults with diabetes: <7.0   C-reactive protein     Status: Abnormal   Collection Time: 12/20/23 11:29 AM  Result Value Ref Range   CRP 13 (H) 0 - 10 mg/L  ANA w/Reflex if Positive     Status: None   Collection Time: 12/20/23 11:29 AM  Result Value Ref Range   Anti Nuclear Antibody (ANA) Negative Negative  CBC with Differential/Platelet     Status: Abnormal   Collection Time: 12/20/23 11:29 AM  Result Value Ref Range   WBC 10.2 3.4 - 10.8 x10E3/uL   RBC 5.67 4.14 - 5.80 x10E6/uL   Hemoglobin 15.8 13.0 - 17.7 g/dL   Hematocrit 21.3 08.6 - 51.0 %   MCV 86 79 - 97 fL   MCH 27.9 26.6 - 33.0 pg   MCHC 32.4 31.5 - 35.7 g/dL  RDW 13.5 11.6 - 15.4 %   Platelets 266 150 - 450 x10E3/uL   Neutrophils 61 Not Estab. %   Lymphs 26 Not Estab. %   Monocytes 10 Not Estab. %   Eos 1 Not Estab. %   Basos 1 Not Estab. %   Neutrophils Absolute 6.4 1.4 - 7.0 x10E3/uL   Lymphocytes Absolute 2.6 0.7 - 3.1 x10E3/uL   Monocytes Absolute 1.0 (H) 0.1 - 0.9 x10E3/uL   EOS (ABSOLUTE) 0.1 0.0 - 0.4 x10E3/uL   Basophils Absolute 0.1 0.0 - 0.2 x10E3/uL   Immature Granulocytes 1 Not Estab. %   Immature Grans (Abs) 0.1 0.0 - 0.1 x10E3/uL  Multiple Myeloma Panel (SPEP&IFE w/QIG)     Status: Abnormal   Collection Time: 12/20/23 11:29 AM  Result Value Ref Range   IgG (Immunoglobin G), Serum 1,213 603 - 1,613 mg/dL   IgA/Immunoglobulin A, Serum 546 (H) 90 - 386 mg/dL   IgM (Immunoglobulin M), Srm 98 20 - 172 mg/dL   Albumin SerPl Elph-Mcnc 3.8 2.9 - 4.4 g/dL   Alpha 1 0.2 0.0 - 0.4 g/dL   Alpha2 Glob SerPl Elph-Mcnc 0.6 0.4 - 1.0 g/dL   B-Globulin SerPl Elph-Mcnc 1.4 (H) 0.7 - 1.3 g/dL   Gamma Glob SerPl Elph-Mcnc 1.0 0.4 - 1.8 g/dL   M Protein SerPl Elph-Mcnc Not Observed Not Observed g/dL   Globulin, Total 3.2 2.2 - 3.9 g/dL   Albumin/Glob SerPl 1.2 0.7 - 1.7   IFE 1 Comment (A)     Comment: Polyclonal increase detected in one or more immunoglobulins.   Please Note Comment     Comment: Protein electrophoresis scan will follow via computer, mail, or courier delivery.   Comprehensive metabolic panel with GFR     Status: Abnormal   Collection Time: 12/20/23 11:29 AM  Result Value Ref Range   Glucose 66 (L) 70 - 99 mg/dL   BUN 8 6 - 24 mg/dL   Creatinine, Ser 4.09 0.76 - 1.27 mg/dL   eGFR 811 >91 YN/WGN/5.62   BUN/Creatinine Ratio 9 9 - 20   Sodium 141 134 - 144 mmol/L   Potassium 4.7 3.5 - 5.2 mmol/L   Chloride 104 96 - 106 mmol/L   CO2 23 20 - 29 mmol/L   Calcium  9.6 8.7 - 10.2 mg/dL   Total Protein 7.0 6.0 - 8.5 g/dL   Albumin 4.1 3.8 - 4.9 g/dL   Globulin, Total 2.9 1.5 - 4.5 g/dL   Bilirubin Total 0.4 0.0 - 1.2 mg/dL    Alkaline Phosphatase 158 (H) 44 - 121 IU/L   AST 15 0 - 40 IU/L   ALT 24 0 - 44 IU/L  PSA     Status: None   Collection Time: 01/03/24 11:10 AM  Result Value Ref Range   Prostatic Specific Antigen 0.71 0.00 - 4.00 ng/mL    Comment: (NOTE) While PSA levels of <=4.00 ng/ml are reported as reference range, some men with levels below 4.00 ng/ml can have prostate cancer and many men with PSA above 4.00 ng/ml do not have prostate cancer.  Other tests such as free PSA, age specific reference ranges, PSA velocity and PSA doubling time may be helpful especially in men less than 44 years old. Performed at [facility], [address] [place], [place] [address]   Hemoglobin and hematocrit, blood     Status: None   Collection Time: 01/03/24 11:11 AM  Result Value Ref Range   Hemoglobin 16.5 13.0 - 17.0 g/dL  HCT 51.9 39.0 - 52.0 %    Comment: Performed at [facility], [address], [place], [place] [address]  Urinalysis, Routine w reflex microscopic     Status: Abnormal   Collection Time: 01/05/24  2:15 PM  Result Value Ref Range   Specific Gravity, UA 1.025 1.005 - 1.030   pH, UA 6.0 5.0 - 7.5   Color, UA Yellow Yellow   Appearance Ur Clear Clear   Leukocytes,UA Negative Negative   Protein,UA 2+ (A) Negative/Trace   Glucose, UA Negative Negative   Ketones, UA Trace (A) Negative   RBC, UA Negative Negative   Bilirubin, UA Negative Negative   Urobilinogen, Ur 1.0 0.2 - 1.0 mg/dL   Nitrite, UA Negative Negative   Microscopic Examination See below:     Comment: Microscopic was indicated and was performed.  Microscopic Examination     Status: Abnormal   Collection Time: 01/05/24  2:15 PM   Urine  Result Value Ref Range   WBC, UA None seen 0 - 5 /hpf   RBC, Urine None seen 0 - 2 /hpf   Epithelial Cells (non renal) 0-10 0 - 10 /hpf   Casts Present (A) None seen /lpf   Cast Type Granular casts (A) N/A   Mucus, UA Present (A) Not Estab.   Bacteria, UA None seen None  seen/Few    Studies/Results: No results found.   Assessment/Plan: History of balanitis and a buried penis.  He has gained some weight back.    Hypogonadism.  I wilI get a T level today and in 3 months.   He will remain on  TRT with 100mg  IM qwk.   His script is current.   ED.   He didn't respond to sildenafil . I will have him try daily tadalafil  in combination for the ED and LUTS.   LUTS.  His voiding symptoms are stable.  His UA is unremarkable today and has no glucose.   Meds ordered this encounter  Medications   tadalafil  (CIALIS ) 5 MG tablet    Sig: Take 1 tablet (5 mg total) by mouth daily as needed for erectile dysfunction.    Dispense:  30 tablet    Refill:  11     Orders Placed This Encounter  Procedures   Microscopic Examination   Urinalysis, Routine w reflex microscopic   Testosterone    Testosterone     Standing Status:   Future    Expected Date:   04/06/2024    Expiration Date:   07/07/2024   Hemoglobin and hematocrit, blood    Standing Status:   Future    Expected Date:   04/06/2024    Expiration Date:   07/07/2024     No follow-ups on file.    CC: Luane Rumps PA.      Homero Luster 01/06/2024 [phone]

## 2024-01-06 ENCOUNTER — Encounter: Payer: Self-pay | Admitting: Urology

## 2024-01-08 LAB — TESTOSTERONE,FREE AND TOTAL
Testosterone, Free: 10.5 pg/mL (ref 7.2–24.0)
Testosterone: 786 ng/dL (ref 264–916)

## 2024-01-09 ENCOUNTER — Telehealth: Payer: Self-pay | Admitting: Orthopaedic Surgery

## 2024-01-09 MED ORDER — OXYCODONE-ACETAMINOPHEN 10-325 MG PO TABS: 1.0000 | ORAL_TABLET | ORAL | 0 refills | Status: DC | PRN

## 2024-01-09 NOTE — Telephone Encounter (Signed)
 Dr. Vicente Graham pt - spoke w/the pt, he is requesting a refill for Oxycodone 10-325 to be sent to [facility].

## 2024-01-11 ENCOUNTER — Other Ambulatory Visit: Payer: Self-pay | Admitting: Neurology

## 2024-01-13 ENCOUNTER — Telehealth: Payer: Self-pay

## 2024-01-13 NOTE — Telephone Encounter (Signed)
 Medication prior authorization request received.  Completed PA request through cover my meds for drug Tadalafil . KEY: [id number]  Approved: Pending

## 2024-01-16 ENCOUNTER — Telehealth: Payer: Self-pay | Admitting: Orthopaedic Surgery

## 2024-01-16 MED ORDER — OXYCODONE-ACETAMINOPHEN 10-325 MG PO TABS: 1.0000 | ORAL_TABLET | ORAL | 0 refills | Status: DC | PRN

## 2024-01-16 NOTE — Telephone Encounter (Signed)
 Dr. Vicente Graham pt - spoke w/the pt, he is requesting a refill for Oxycodone  10-325 to be sent to [facility]

## 2024-01-23 ENCOUNTER — Telehealth: Payer: Self-pay | Admitting: Orthopaedic Surgery

## 2024-01-23 MED ORDER — OXYCODONE-ACETAMINOPHEN 10-325 MG PO TABS: 1.0000 | ORAL_TABLET | ORAL | 0 refills | Status: DC | PRN

## 2024-01-23 NOTE — Telephone Encounter (Signed)
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

## 2024-01-26 ENCOUNTER — Telehealth: Payer: Self-pay | Admitting: Orthopaedic Surgery

## 2024-01-26 NOTE — Telephone Encounter (Signed)
 Dr. Vicente Graham pt - spoke w/the pt, he is concerned about getting his medication since we will be closed for [facility].  Oxycodone  10-325, [facility].

## 2024-01-31 ENCOUNTER — Telehealth: Payer: Self-pay | Admitting: Orthopaedic Surgery

## 2024-01-31 MED ORDER — OXYCODONE-ACETAMINOPHEN 10-325 MG PO TABS: 1.0000 | ORAL_TABLET | ORAL | 0 refills | Status: DC | PRN

## 2024-01-31 NOTE — Telephone Encounter (Signed)
 Per med list I checked today he has already refilled the oxycodone 

## 2024-01-31 NOTE — Telephone Encounter (Signed)
 Dr. Sanjuan Dame pt - pt lvm requesting a refill for Oxycodone 10-325 to be sent to [facility] [place]

## 2024-02-06 ENCOUNTER — Telehealth: Payer: Self-pay | Admitting: Orthopaedic Surgery

## 2024-02-06 MED ORDER — OXYCODONE-ACETAMINOPHEN 10-325 MG PO TABS: 1.0000 | ORAL_TABLET | ORAL | 0 refills | Status: DC | PRN

## 2024-02-06 NOTE — Telephone Encounter (Signed)
 Dr. Sanjuan Dame pt - pt lvm requesting a refill for Oxycodone 10-325 to be sent to [facility] [place]

## 2024-02-08 ENCOUNTER — Ambulatory Visit: Admitting: Podiatry

## 2024-02-08 ENCOUNTER — Encounter: Payer: Self-pay | Admitting: Podiatry

## 2024-02-08 ENCOUNTER — Ambulatory Visit (INDEPENDENT_AMBULATORY_CARE_PROVIDER_SITE_OTHER): Admitting: Podiatry

## 2024-02-08 VITALS — Ht 68.0 in | Wt 344.0 lb

## 2024-02-08 DIAGNOSIS — B351 Tinea unguium: Secondary | ICD-10-CM | POA: Diagnosis not present

## 2024-02-08 DIAGNOSIS — L97522 Non-pressure chronic ulcer of other part of left foot with fat layer exposed: Secondary | ICD-10-CM

## 2024-02-08 DIAGNOSIS — E08621 Diabetes mellitus due to underlying condition with foot ulcer: Secondary | ICD-10-CM

## 2024-02-08 DIAGNOSIS — M79675 Pain in left toe(s): Secondary | ICD-10-CM

## 2024-02-08 DIAGNOSIS — M79674 Pain in right toe(s): Secondary | ICD-10-CM

## 2024-02-08 NOTE — Progress Notes (Signed)
   Chief Complaint  Patient presents with   Nail Problem    Pt is here for [facility].    Subjective:  58 y.o. male with PMHx of diabetes mellitus presenting today for evaluation of pain and tenderness in the elongated toenails bilateral.  He also has symptomatic callus lesions to the plantar aspect of the bilateral feet.  He has recently noticed some drainage coming from the area.   Past Medical History:  Diagnosis Date   Arthritis    Diabetes mellitus without complication (HCC)    diagnosed about age 48   Gout    Hypertension    boarderline    Past Surgical History:  Procedure Laterality Date   HERNIA REPAIR  2008   MASS EXCISION Left age 67   L thigh- benign    Allergies  Allergen Reactions   Wound Dressing Adhesive     Hives, itching- noted with the G 7 Dexcom Sensor     Objective/Physical Exam General: The patient is alert and oriented x3 in no acute distress.  Dermatology:  Wound #1 noted to the plantar aspect of the first MTP bilaterally.  Approximately 0.5 x 0.5 x 0.2 cm (LxWxD).   To the noted ulceration(s), there is no eschar. There is a moderate amount of slough, fibrin, and necrotic tissue noted. Granulation tissue and wound base is red. There is a minimal amount of serosanguineous drainage noted. There is no exposed bone muscle-tendon ligament or joint. There is no malodor. Periwound integrity is intact. Hyperkeratotic elongated dystrophic symptomatic nails also noted 1-5 bilateral  Vascular: Palpable pedal pulses bilaterally. No edema or erythema noted. Capillary refill within normal limits.  Neurological: Light touch and protective threshold diminished bilaterally.   Musculoskeletal Exam: Range of motion within normal limits to all pedal and ankle joints bilateral. Muscle strength 5/5 in all groups bilateral.   Assessment: 1.  Ulcer bilateral first MTP secondary to diabetes mellitus 2. diabetes mellitus w/ peripheral neuropathy 3.  Pain due to onychomycosis  located above   Plan of Care:  -Patient was evaluated. -Medically necessary excisional debridement including subcutaneous tissue was performed using a tissue nipper and a chisel blade. Excisional debridement of all the necrotic nonviable tissue down to healthy bleeding viable tissue was performed with post-debridement measurements same as pre-. -The wound was cleansed and dry sterile dressing applied. -Recommend triple antibiotic with a Band-Aid daily -Mechanical debridement of nails 1-5 bilateral performed using a nail nipper without incident or bleeding -Return to clinic 3 months routine footcare   Dot Gazella, DPM [facility]  Dr. Dot Gazella, DPM    [address] [place], [place] [address]                Office [phone]  Fax [phone]

## 2024-02-13 ENCOUNTER — Telehealth: Payer: Self-pay | Admitting: Orthopaedic Surgery

## 2024-02-13 NOTE — Telephone Encounter (Signed)
 Dr. Sanjuan Dame pt - pt lvm requesting a refill for Oxycodone 10-325 to be sent to [facility] [place]

## 2024-02-13 NOTE — Addendum Note (Signed)
 Addended by: [place] Snow T on: 02/13/2024 12:11 PM   Modules accepted: Orders

## 2024-02-14 MED ORDER — OXYCODONE-ACETAMINOPHEN 10-325 MG PO TABS: 1.0000 | ORAL_TABLET | ORAL | 0 refills | Status: DC | PRN

## 2024-02-14 NOTE — Addendum Note (Signed)
 Addended by: Milinda Allen on: 02/14/2024 08:01 AM   Modules accepted: Orders

## 2024-02-20 ENCOUNTER — Telehealth: Payer: Self-pay | Admitting: Orthopaedic Surgery

## 2024-02-20 MED ORDER — OXYCODONE-ACETAMINOPHEN 10-325 MG PO TABS: 1.0000 | ORAL_TABLET | ORAL | 0 refills | Status: DC | PRN

## 2024-02-20 NOTE — Telephone Encounter (Signed)
 Dr. Sanjuan Dame pt - pt lvm requesting a refill for Oxycodone 10-325 to be sent to [facility] [place]

## 2024-02-27 ENCOUNTER — Telehealth: Payer: Self-pay | Admitting: Orthopaedic Surgery

## 2024-02-27 MED ORDER — OXYCODONE-ACETAMINOPHEN 10-325 MG PO TABS: 1.0000 | ORAL_TABLET | ORAL | 0 refills | Status: DC | PRN

## 2024-02-27 NOTE — Telephone Encounter (Signed)
 Dr. Sanjuan Dame pt - pt lvm requesting a refill for Oxycodone 10-325 to be sent to [facility] [place]

## 2024-03-05 ENCOUNTER — Telehealth: Payer: Self-pay | Admitting: Orthopaedic Surgery

## 2024-03-05 MED ORDER — OXYCODONE-ACETAMINOPHEN 10-325 MG PO TABS: 1.0000 | ORAL_TABLET | ORAL | 0 refills | Status: DC | PRN

## 2024-03-05 NOTE — Telephone Encounter (Signed)
 DR. BRENNA  PATIENT CALLED LVM REQUESTING REFILL ON HIS PAIN MEDICINE  oxyCODONE -acetaminophen  (PERCOCET) 10-325 MG tablet   [facility]

## 2024-03-12 ENCOUNTER — Telehealth: Payer: Self-pay | Admitting: Orthopaedic Surgery

## 2024-03-12 MED ORDER — OXYCODONE-ACETAMINOPHEN 10-325 MG PO TABS
1.0000 | ORAL_TABLET | ORAL | 0 refills | Status: DC | PRN
Start: 2024-03-12 — End: 2024-03-19

## 2024-03-12 NOTE — Telephone Encounter (Signed)
 Dr. Sanjuan Dame pt - pt lvm requesting a refill for Oxycodone 10-325 to be sent to [facility] [place]

## 2024-03-14 ENCOUNTER — Ambulatory Visit: Admitting: Orthopaedic Surgery

## 2024-03-14 ENCOUNTER — Encounter: Payer: Self-pay | Admitting: Orthopaedic Surgery

## 2024-03-14 VITALS — BP 133/84 | HR 94 | Ht 68.0 in | Wt 344.0 lb

## 2024-03-14 DIAGNOSIS — M545 Low back pain, unspecified: Secondary | ICD-10-CM

## 2024-03-14 DIAGNOSIS — Z6841 Body Mass Index (BMI) 40.0 and over, adult: Secondary | ICD-10-CM

## 2024-03-14 NOTE — Progress Notes (Signed)
 My back hurts.  He has chronic pain of the lower back but no recent trauma or episodes.  He has periods of pain for several days at a time now.  He is taking his pain medicine and trying to be active.  He is trying to lose weight.  ROM of the back is decreased, no spasm, muscle tone and strength normal, NV intact, no limp.  Encounter Diagnoses  Name Primary?   Lumbar pain Yes   Morbid obesity due to excess calories (HCC)    Body mass index 50.0-59.9, adult (HCC)    Continue his exercises and pain medicine.  He will have dental work soon.  I told him to tell the dentist I am giving pain medicine.  If the dentist changes the pain medicine, I will not resume until dentist says OK.  Patient understands.  Return in three months.  Call if any problem.  Precautions discussed.  Electronically Signed Lemond Stable, MD 7/9/20252:16 PM

## 2024-03-19 ENCOUNTER — Telehealth: Payer: Self-pay | Admitting: Orthopaedic Surgery

## 2024-03-19 MED ORDER — OXYCODONE-ACETAMINOPHEN 10-325 MG PO TABS: 1.0000 | ORAL_TABLET | ORAL | 0 refills | Status: DC | PRN

## 2024-03-19 NOTE — Telephone Encounter (Signed)
 Dr. Sanjuan Dame pt - pt lvm requesting a refill for Oxycodone 10-325 to be sent to [facility] [place]

## 2024-03-26 ENCOUNTER — Telehealth: Payer: Self-pay | Admitting: Orthopaedic Surgery

## 2024-03-26 MED ORDER — OXYCODONE-ACETAMINOPHEN 10-325 MG PO TABS: 1.0000 | ORAL_TABLET | ORAL | 0 refills | Status: DC | PRN

## 2024-03-26 NOTE — Telephone Encounter (Signed)
 Dr. Sanjuan Dame pt - pt lvm requesting a refill for Oxycodone 10-325 to be sent to [facility] [place]

## 2024-04-02 ENCOUNTER — Telehealth: Payer: Self-pay | Admitting: Orthopaedic Surgery

## 2024-04-02 MED ORDER — OXYCODONE-ACETAMINOPHEN 10-325 MG PO TABS: 1.0000 | ORAL_TABLET | ORAL | 0 refills | Status: DC | PRN

## 2024-04-02 NOTE — Telephone Encounter (Signed)
 DR. BRENNA   Patient request refill on his pain medicine   oxyCODONE -acetaminophen  (PERCOCET) 10-325 MG tablet    [facility]

## 2024-04-03 ENCOUNTER — Ambulatory Visit: Admitting: Nurse Practitioner

## 2024-04-05 ENCOUNTER — Ambulatory Visit (INDEPENDENT_AMBULATORY_CARE_PROVIDER_SITE_OTHER): Admitting: Nurse Practitioner

## 2024-04-05 ENCOUNTER — Encounter: Payer: Self-pay | Admitting: Nurse Practitioner

## 2024-04-05 VITALS — BP 122/78 | HR 71 | Ht 68.0 in | Wt 342.0 lb

## 2024-04-05 DIAGNOSIS — E1165 Type 2 diabetes mellitus with hyperglycemia: Secondary | ICD-10-CM

## 2024-04-05 DIAGNOSIS — E782 Mixed hyperlipidemia: Secondary | ICD-10-CM | POA: Diagnosis not present

## 2024-04-05 DIAGNOSIS — Z794 Long term (current) use of insulin: Secondary | ICD-10-CM

## 2024-04-05 DIAGNOSIS — Z7984 Long term (current) use of oral hypoglycemic drugs: Secondary | ICD-10-CM

## 2024-04-05 DIAGNOSIS — I10 Essential (primary) hypertension: Secondary | ICD-10-CM | POA: Diagnosis not present

## 2024-04-05 DIAGNOSIS — Z7985 Long-term (current) use of injectable non-insulin antidiabetic drugs: Secondary | ICD-10-CM

## 2024-04-05 MED ORDER — DEXCOM G7 SENSOR MISC: 1.0000 | 3 refills | Status: DC

## 2024-04-05 MED ORDER — INSULIN GLARGINE 100 UNIT/ML ~~LOC~~ SOLN: 40.0000 [IU] | Freq: Every day | SUBCUTANEOUS | 3 refills | Status: DC

## 2024-04-05 MED ORDER — TRULICITY 4.5 MG/0.5ML ~~LOC~~ SOAJ: 4.5000 mg | SUBCUTANEOUS | 3 refills | Status: DC

## 2024-04-05 MED ORDER — NOVOLOG FLEXPEN 100 UNIT/ML ~~LOC~~ SOPN: 10.0000 [IU] | PEN_INJECTOR | Freq: Three times a day (TID) | SUBCUTANEOUS | 3 refills | Status: DC

## 2024-04-05 NOTE — Progress Notes (Signed)
 04/05/2024, 2:04 PM      Endocrinology Follow Up Visit  Subjective:    Patient ID: [name], male    DOB: 1965/11/17.  [name] is being seen in follow up for management of currently uncontrolled symptomatic diabetes requested by  Leigh Lung, MD.   Past Medical History:  Diagnosis Date   Arthritis    Diabetes mellitus without complication [facility])    diagnosed about age 58   Gout    Hypertension    boarderline    Past Surgical History:  Procedure Laterality Date   HERNIA REPAIR  2008   MASS EXCISION Left age 35   L thigh- benign    Social History   Socioeconomic History   Marital status: Single    Spouse name: Not on file   Number of children: Not on file   Years of education: Not on file   Highest education level: Not on file  Occupational History   Not on file  Tobacco Use   Smoking status: Former    Current packs/day: 0.00    Average packs/day: 0.5 packs/day for 5.0 years (2.5 ttl pk-yrs)    Types: Cigarettes    Start date: 11    Quit date: 23    Years since quitting: 35.6   Smokeless tobacco: Never  Vaping Use   Vaping status: Never Used  Substance and Sexual Activity   Alcohol use: No   Drug use: No   Sexual activity: Not on file  Other Topics Concern   Not on file  Social History Narrative   Not on file   Social Drivers of Health   Financial Resource Strain: Not on file  Food Insecurity: Not on file  Transportation Needs: Not on file  Physical Activity: Not on file  Stress: Not on file  Social Connections: Unknown (02/09/2023)   Received from [facility]   Social Network    Social Network: Not on file    Family History  Problem Relation Age of Onset   Cancer Mother    Diabetes Mother    Heart failure Father    Hypertension Father    Diabetes Father    Heart attack Father     Outpatient Encounter Medications as of 04/05/2024  Medication Sig   allopurinol   (ZYLOPRIM ) 300 MG tablet Take 1 tablet (300 mg total) by mouth daily. (Patient taking differently: Take 300 mg by mouth daily. Patient reports that he takes as needed)   amLODipine  (NORVASC ) 10 MG tablet TAKE 1 Tablet BY MOUTH ONCE EVERY DAY (Patient taking differently: Take 10 mg by mouth daily.)   Continuous Glucose Receiver (DEXCOM G7 RECEIVER) DEVI Use to monitor glucose continuously as directed.   DULoxetine  (CYMBALTA ) 60 MG capsule TAKE 1 CAPSULE BY MOUTH EVERY DAY   furosemide  (LASIX ) 20 MG tablet Take 1 tablet (20 mg total) by mouth daily.   gabapentin  (NEURONTIN ) 300 MG capsule Take 1 capsule (300 mg total) by mouth 3 (three) times daily.   glucose blood (TRUE METRIX BLOOD GLUCOSE TEST) test strip Patient is to use 1 test strip to monitor blood glucose 4 times daily .Use as instructed   glucose blood test strip Use as instructed to  monitor glucose 4 times daily   Insulin  Pen Needle (PEN NEEDLES) 31G X 8 MM MISC Use to inject insulin  4 times daily   LINZESS 145 MCG CAPS capsule Take 145 mcg by mouth every morning.   lisinopril  (ZESTRIL ) 20 MG tablet TAKE 1 Tablet BY MOUTH ONCE EVERY DAY   metFORMIN  (GLUCOPHAGE -XR) 500 MG 24 hr tablet Take 2 tablets (1,000 mg total) by mouth daily with breakfast.   metoCLOPramide  (REGLAN ) 10 MG tablet Take 1 tablet (10 mg total) by mouth every 8 (eight) hours as needed for nausea.   naproxen  (NAPROSYN ) 500 MG tablet Take 1 tablet (500 mg total) by mouth 2 (two) times daily.   NEEDLE, DISP, 18 G 18G X 1-1/2 MISC Use to draw up testosterone  for injection   nystatin  cream (MYCOSTATIN ) Apply 1 application. topically 2 (two) times daily. To penile irritation.   oxyCODONE -acetaminophen  (PERCOCET) 10-325 MG tablet Take 1 tablet by mouth every 4 (four) hours as needed for pain.   sildenafil  (VIAGRA ) 100 MG tablet Take 1 tablet (100 mg total) by mouth daily as needed for erectile dysfunction.   SYRINGE-NEEDLE, DISP, 3 ML 22G X 1 3 ML MISC Use to inject testosterone     tadalafil  (CIALIS ) 5 MG tablet Take 1 tablet (5 mg total) by mouth daily as needed for erectile dysfunction.   testosterone  cypionate (DEPOTESTOSTERONE CYPIONATE) 200 MG/ML injection Inject 0.5 mLs (100 mg total) into the muscle every 7 (seven) days.   [DISCONTINUED] Continuous Glucose Sensor (DEXCOM G7 SENSOR) MISC Inject 1 Application into the skin as directed. Change sensor every 10 days as directed.   [DISCONTINUED] Dulaglutide  (TRULICITY ) 4.5 MG/0.5ML SOAJ Inject 4.5 mg as directed once a week.   [DISCONTINUED] insulin  aspart (NOVOLOG  FLEXPEN) 100 UNIT/ML FlexPen Inject 10-16 Units into the skin 3 (three) times daily with meals.   [DISCONTINUED] insulin  glargine (LANTUS ) 100 UNIT/ML injection Inject 0.4 mLs (40 Units total) into the skin daily.   atorvastatin  (LIPITOR) 80 MG tablet TAKE 1 Tablet BY MOUTH ONCE EVERY DAY   Continuous Glucose Sensor (DEXCOM G7 SENSOR) MISC Inject 1 Application into the skin as directed. Change sensor every 10 days as directed.   Dulaglutide  (TRULICITY ) 4.5 MG/0.5ML SOAJ Inject 4.5 mg as directed once a week.   insulin  aspart (NOVOLOG  FLEXPEN) 100 UNIT/ML FlexPen Inject 10-16 Units into the skin 3 (three) times daily with meals.   insulin  glargine (LANTUS ) 100 UNIT/ML injection Inject 0.4 mLs (40 Units total) into the skin daily.   No facility-administered encounter medications on file as of 04/05/2024.    ALLERGIES: Allergies  Allergen Reactions   Wound Dressing Adhesive     Hives, itching- noted with the G 7 Dexcom Sensor    VACCINATION STATUS: Immunization History  Administered Date(s) Administered   Influenza Inj Mdck Quad Pf 06/13/2019   Influenza,inj,Quad PF,6+ Mos 06/30/2017, 07/08/2018   Moderna Sars-Covid-2 Vaccination 04/01/2020, 04/29/2020    Diabetes He presents for his follow-up diabetic visit. He has type 2 diabetes mellitus. Onset time: He was diagnosed at approximate age of 49 years. His disease course has been worsening. Pertinent  negatives for hypoglycemia include no confusion, headaches, pallor, seizures, sweats or tremors. Associated symptoms include fatigue. Pertinent negatives for diabetes include no blurred vision, no chest pain, no polydipsia, no polyphagia, no polyuria, no weakness and no weight loss. There are no hypoglycemic complications. Symptoms are improving. There are no diabetic complications. Risk factors for coronary artery disease include dyslipidemia, diabetes mellitus, hypertension, male sex, obesity and sedentary lifestyle. Current  diabetic treatment includes intensive insulin  program and oral agent (monotherapy) (and Trulicity ). He is compliant with treatment most of the time. His weight is fluctuating minimally. He is following a generally unhealthy diet. When asked about meal planning, he reported none. He has not had a previous visit with a dietitian. He never participates in exercise. His overall blood glucose range is 140-180 mg/dl. (He presents today with his CGM showing fluctuating, above target glycemic profile overall.  His POCT A1c today is 8.5%, increasing from last visit of 8.3%.  Analysis of his CGM shows TIR 84%, TAR 16%, TBR <1% with a GMI of 6.9%.  He denies any significant hypoglycemia.  He admits he has cheated on his diet some but is trying to get back on track.  He notes he will be having oral surgery soon.) An ACE inhibitor/angiotensin II receptor blocker is being taken. He does not see a podiatrist.Eye exam is current.  Hyperlipidemia This is a chronic problem. The current episode started more than 1 year ago. The problem is uncontrolled. Recent lipid tests were reviewed and are high. Exacerbating diseases include diabetes and obesity. Factors aggravating his hyperlipidemia include beta blockers and fatty foods. Pertinent negatives include no chest pain, myalgias or shortness of breath. Current antihyperlipidemic treatment includes statins. The current treatment provides moderate improvement of  lipids. Compliance problems include adherence to diet, adherence to exercise and medication cost.  Risk factors for coronary artery disease include dyslipidemia, diabetes mellitus, family history, hypertension, male sex, obesity and a sedentary lifestyle.  Hypertension This is a chronic problem. The current episode started more than 1 year ago. The problem has been resolved since onset. The problem is controlled. Pertinent negatives include no blurred vision, chest pain, headaches, malaise/fatigue, neck pain, palpitations, shortness of breath or sweats. There are no associated agents to hypertension. Risk factors for coronary artery disease include dyslipidemia, diabetes mellitus, male gender, obesity and sedentary lifestyle. Past treatments include ACE inhibitors, beta blockers, calcium  channel blockers and diuretics. The current treatment provides mild improvement. Compliance problems include medication cost, diet and exercise.     Review of systems  Constitutional: + stable body weight,  current Body mass index is 52 kg/m., + fatigue, no subjective hyperthermia, no subjective hypothermia Eyes: + blurry vision, no xerophthalmia ENT: no sore throat, no nodules palpated in throat, no dysphagia/odynophagia, no hoarseness Cardiovascular: no chest pain, no shortness of breath, no palpitations, no leg swelling Respiratory: no cough, no shortness of breath Gastrointestinal: no nausea/vomiting/diarrhea Musculoskeletal: + generalized muscle/joint aches Skin: no rashes, no hyperemia Neurological: no tremors, no numbness, no tingling, no dizziness Psychiatric: no depression, no anxiety    Objective:    BP 122/78 (BP Location: Left Arm, Patient Position: Sitting, Cuff Size: Large)   Pulse 71   Ht 5' 8 (1.727 m)   Wt (!) 342 lb (155.1 kg)   BMI 52.00 kg/m   Wt Readings from Last 3 Encounters:  04/05/24 (!) 342 lb (155.1 kg)  03/14/24 (!) 344 lb (156 kg)  02/08/24 (!) 344 lb (156 kg)    BP  Readings from Last 3 Encounters:  04/05/24 122/78  03/14/24 133/84  01/05/24 121/87     Physical Exam- Limited  Constitutional:  Body mass index is 52 kg/m. , not in acute distress, normal state of mind Eyes:  EOMI, no exophthalmos Musculoskeletal: no gross deformities, strength intact in all four extremities, no gross restriction of joint movements Skin:  no rashes, no hyperemia Neurological: no tremor with outstretched hands  CMP ( most recent) CMP     Component Value Date/Time   NA 141 12/20/2023 1129   K 4.7 12/20/2023 1129   CL 104 12/20/2023 1129   CO2 23 12/20/2023 1129   GLUCOSE 66 (L) 12/20/2023 1129   GLUCOSE 131 (H) 11/10/2022 1833   BUN 8 12/20/2023 1129   CREATININE 0.87 12/20/2023 1129   CREATININE 1.42 (H) 09/08/2017 1540   CALCIUM  9.6 12/20/2023 1129   PROT 7.0 12/20/2023 1129   ALBUMIN 4.1 12/20/2023 1129   AST 15 12/20/2023 1129   ALT 24 12/20/2023 1129   ALKPHOS 158 (H) 12/20/2023 1129   BILITOT 0.4 12/20/2023 1129   GFRNONAA >60 11/10/2022 1819   GFRAA >60 03/17/2020 1200     Diabetic Labs (most recent): Lab Results  Component Value Date   HGBA1C 8.0 (H) 12/20/2023   HGBA1C 8.3 (A) 11/29/2023   HGBA1C 7.8 (A) 07/21/2023   MICROALBUR 7.2 (H) 07/19/2022   MICROALBUR <3.0 (H) 07/07/2021   MICROALBUR 80 02/26/2021     Lipid Panel ( most recent) Lipid Panel     Component Value Date/Time   CHOL 263 (H) 07/19/2022 1337   TRIG 77 07/19/2022 1337   HDL 62 07/19/2022 1337   CHOLHDL 4.2 07/19/2022 1337   VLDL 15 07/19/2022 1337   LDLCALC 186 (H) 07/19/2022 1337      Lab Results  Component Value Date   TSH 0.922 12/20/2023   TSH 0.532 06/28/2017       Assessment & Plan:   1) Uncontrolled type 2 diabetes mellitus with hyperglycemia (HCC)  - [name] has currently uncontrolled symptomatic type 2 DM since  58 years of age.  He presents today with his CGM showing fluctuating, above target glycemic profile overall.  His POCT A1c  today is 8.5%, increasing from last visit of 8.3%.  Analysis of his CGM shows TIR 84%, TAR 16%, TBR <1% with a GMI of 6.9%.  He denies any significant hypoglycemia.  He admits he has cheated on his diet some but is trying to get back on track.  He notes he will be having oral surgery soon.  -Recent labs reviewed.  - I had a long discussion with him about the progressive nature of diabetes and the pathology behind its complications. -his diabetes is complicated by obesity/sedentary life, inadequate insurance coverage,  And he remains at a high risk for more acute and chronic complications which include CAD, CVA, CKD, retinopathy, and neuropathy. These are all discussed in detail with him.  - Nutritional counseling repeated at each appointment due to patients tendency to fall back in to old habits.  - The patient admits there is a room for improvement in their diet and drink choices. -  Suggestion is made for the patient to avoid simple carbohydrates from their diet including Cakes, Sweet Desserts / Pastries, Ice Cream, Soda (diet and regular), Sweet Tea, Candies, Chips, Cookies, Sweet Pastries, Store Bought Juices, Alcohol in Excess of 1-2 drinks a day, Artificial Sweeteners, Coffee Creamer, and Sugar-free Products. This will help patient to have stable blood glucose profile and potentially avoid unintended weight gain.   - I encouraged the patient to switch to unprocessed or minimally processed complex starch and increased protein intake (animal or plant source), fruits, and vegetables.   - Patient is advised to stick to a routine mealtimes to eat 3 meals a day and avoid unnecessary snacks (to snack only to correct hypoglycemia).  - he will be scheduled with Penny Crumpton,  RDN, CDE for diabetes education.  - I have approached him with the following individualized plan to manage  his diabetes and patient agrees:   -Based on his improving, mostly at target glycemic profile, no changes will be  made to his medications today. He is advised to continue Lantus  40 units SQ nightly, continue Novolog  10-16 units TID with meals if glucose is above 90 and he is eating (Specific instructions on how to titrate insulin  dosage based on glucose readings given to patient in writing), continue Metformin  1000 mg ER PO daily with breakfast, and Trulicity  4.5 mg SQ weekly.    -Although his A1c is slightly worse, his CGM is estimating around a 6.9%, I do believe he is ok for oral surgery from a diabetes standpoint.  He is encouraged consistently monitor blood glucose 4 times per day (using his CGM), before meals and at bedtime and report to the clinic if readings are less than 70 or greater than 300 for 3 tests in a row.   - Specific targets for  A1c;  LDL, HDL,  and Triglycerides were discussed with the patient.  2) Blood Pressure /Hypertension:   His blood pressure is controlled to target.  He is advised to continue meds as prescribed by his PCP.  3) Lipids/Hyperlipidemia:   His recent lipid panel from 07/19/22 shows uncontrolled LDL of 186.  He is advised to continue Atorvastatin  40 mg po daily at bedtime.  Side effects and precautions discussed with him.    4)  Weight/Diet:  His Body mass index is 52 kg/m.-   clearly complicating his diabetes care.   he is  a candidate for modest weight loss. I discussed with him the fact that loss of 5 - 10% of his  current body weight will have the most impact on his diabetes management.  Exercise, and detailed carbohydrates information provided  -  detailed on discharge instructions.  He is a good candidate for bariatric surgery, however at this point he has no insurance.  5) Chronic Care/Health Maintenance: -he is on ACEI/ARB and Statin medications and is encouraged to initiate and continue to follow up with Ophthalmology, Dentist,  Podiatrist at least yearly or according to recommendations, and advised to stay away from smoking. I have recommended yearly flu  vaccine and pneumonia vaccine at least every 5 years; moderate intensity exercise for up to 150 minutes weekly; and  sleep for at least 7 hours a day.  - he is advised to maintain close follow up with Leigh Lung, MD for primary care needs, as well as his other providers for optimal and coordinated care.     I spent  26  minutes in the care of the patient today including review of labs from CMP, Lipids, Thyroid Function, Hematology (current and previous including abstractions from other facilities); face-to-face time discussing  his blood glucose readings/logs, discussing hypoglycemia and hyperglycemia episodes and symptoms, medications doses, his options of short and long term treatment based on the latest standards of care / guidelines;  discussion about incorporating lifestyle medicine;  and documenting the encounter. Risk reduction counseling performed per USPSTF guidelines to reduce obesity and cardiovascular risk factors.     Please refer to Patient Instructions for Blood Glucose Monitoring and Insulin /Medications Dosing Guide  in media tab for additional information. Please  also refer to  Patient Self Inventory in the Media  tab for reviewed elements of pertinent patient history.  [name] participated in the discussions, expressed understanding, and voiced  agreement with the above plans.  All questions were answered to his satisfaction. he is encouraged to contact clinic should he have any questions or concerns prior to his return visit.   Follow up plan: - Return in about 4 months (around 08/05/2024) for Diabetes F/U with A1c in office, No previsit labs, Bring meter and logs.  Benton Rio, [facility] [address] [place], [place] [address] Phone: [phone] Fax: [phone]  04/05/2024, 2:04 PM

## 2024-04-06 ENCOUNTER — Other Ambulatory Visit (HOSPITAL_COMMUNITY): Payer: Self-pay

## 2024-04-06 ENCOUNTER — Telehealth: Payer: Self-pay | Admitting: Pharmacy Technician

## 2024-04-06 NOTE — Telephone Encounter (Signed)
 Pharmacy Patient Advocate Encounter   Received notification from CoverMyMeds that prior authorization for Trulicity  4.5MG /0.5ML auto-injectors is required/requested.   Insurance verification completed.   The patient is insured through [employer] .   Per test claim: PA required; PA submitted to above mentioned insurance via CoverMyMeds Key/confirmation #/EOC [id number] Status is pending

## 2024-04-08 ENCOUNTER — Encounter: Payer: Self-pay | Admitting: Orthopaedic Surgery

## 2024-04-09 ENCOUNTER — Other Ambulatory Visit

## 2024-04-09 ENCOUNTER — Telehealth: Payer: Self-pay | Admitting: Orthopaedic Surgery

## 2024-04-09 MED ORDER — OXYCODONE-ACETAMINOPHEN 10-325 MG PO TABS: 1.0000 | ORAL_TABLET | ORAL | 0 refills | Status: DC | PRN

## 2024-04-09 NOTE — Telephone Encounter (Signed)
 Pharmacy Patient Advocate Encounter  Received notification from [facility] that Prior Authorization for Trulicity  4.5MG /0.5ML auto-injectors has been DENIED.  Full denial letter will be uploaded to the media tab. See denial reason below.     PA #/Case ID/Reference #: [id number]

## 2024-04-09 NOTE — Telephone Encounter (Signed)
 Dr. Sanjuan Dame pt - pt lvm requesting a refill for Oxycodone 10-325 to be sent to [facility] [place]

## 2024-04-16 ENCOUNTER — Other Ambulatory Visit: Payer: Self-pay | Admitting: Orthopaedic Surgery

## 2024-04-16 MED ORDER — OXYCODONE-ACETAMINOPHEN 10-325 MG PO TABS: 1.0000 | ORAL_TABLET | ORAL | 0 refills | Status: DC | PRN

## 2024-04-16 NOTE — Telephone Encounter (Signed)
 Dr. Sanjuan Dame pt - pt lvm requesting a refill for Oxycodone 10-325 to be sent to [facility] [place]

## 2024-04-18 ENCOUNTER — Ambulatory Visit: Admitting: Urology

## 2024-04-18 NOTE — Addendum Note (Signed)
 Addended by: MARCINE HUSBAND T on: 04/18/2024 08:11 AM   Modules accepted: Orders

## 2024-04-23 ENCOUNTER — Telehealth: Payer: Self-pay | Admitting: Orthopaedic Surgery

## 2024-04-23 NOTE — Addendum Note (Signed)
 Addended by: MARCINE HUSBAND T on: 04/23/2024 01:48 PM   Modules accepted: Orders

## 2024-04-23 NOTE — Telephone Encounter (Signed)
 Dr. Sanjuan Dame pt - pt lvm requesting a refill for Oxycodone 10-325 to be sent to [facility] [place]

## 2024-04-24 MED ORDER — OXYCODONE-ACETAMINOPHEN 10-325 MG PO TABS
1.0000 | ORAL_TABLET | ORAL | 0 refills | Status: DC | PRN
Start: 2024-04-24 — End: 2024-04-30

## 2024-04-24 NOTE — Addendum Note (Signed)
 Addended by: BRENNA NORLEEN ORN on: 04/24/2024 08:18 AM   Modules accepted: Orders

## 2024-04-27 ENCOUNTER — Encounter: Payer: Self-pay | Admitting: Radiology

## 2024-04-30 ENCOUNTER — Telehealth: Payer: Self-pay | Admitting: Orthopaedic Surgery

## 2024-04-30 MED ORDER — OXYCODONE-ACETAMINOPHEN 10-325 MG PO TABS: 1.0000 | ORAL_TABLET | ORAL | 0 refills | Status: DC | PRN

## 2024-04-30 NOTE — H&P (Signed)
  Patient: Tulio Facundo  PID: [id number]  DOB: 27-Apr-1966  SEX: Male   Patient referred by  DDS for extraction teeth #'s 2, 4, 5, 6, 7, 8, 9, 10, 11, 12, 16, 17, 19, 20, 29, 30, 32.   CC: Pain lower left in past.   Past Medical History:  HTN, Diabetes, Gout, Arthritis, Morbid Obesity    Medications: Amlodipine , Lipitor, Trulicity , Cymbalta , Lasix , Neurontin , Novolog , Lantus , Zestril , Metformin , Reglan , Oxycodone , Sildenafil , Cialis , allopurinol , Testosterone     Allergies:     NKDA    Surgeries:   Hernia     Social History       Smoking:            Alcohol: Drug use:                             Exam: BMI  50.  Multiple caries teeth #'s 2, 4, 5, 6, 7, 8, 9, 10, 11, 12, 16, 17, 19, 20, 29, 30, 32.  No purulence, edema, fluctuance, trismus. Oral cancer screening negative. Pharynx clear. No lymphadenopathy.  Panorex:teeth #'s 2, 4, 5, 6, 7, 8, 9, 10, 11, 12, 16, 17, 19, 20, 29, 30, 32.   Assessment: ASA 3. Non-restorable   teeth #'s 2, 4, 5, 6, 7, 8, 9, 10, 11, 12, 16, 17, 19, 20, 29, 30, 32.             Plan: 1. MD Clearance  2. Extraction  teeth #'s 2, 4, 5, 6, 7, 8, 9, 10, 11, 12, 16, 17, 19, 20, 29, 30, 32.  Alveoloplasty. Hospital Day surgery.                 Rx: n               Risks and complications explained. Questions answered.   Glendia EMERSON Primrose, DMD

## 2024-04-30 NOTE — Telephone Encounter (Signed)
 Dr. Sanjuan Dame pt - pt lvm requesting a refill for Oxycodone 10-325 to be sent to [facility] [place]

## 2024-05-02 ENCOUNTER — Other Ambulatory Visit: Payer: Self-pay

## 2024-05-02 ENCOUNTER — Encounter (HOSPITAL_COMMUNITY): Payer: Self-pay | Admitting: Oral Surgery

## 2024-05-02 NOTE — Progress Notes (Signed)
 SDW call  Patient was given pre-op instructions over the phone. Patient verbalized understanding of instructions provided.     PCP - Benton Rio, NP Cardiologist -  Pulmonary:    PPM/ICD - denies Device Orders - na Rep Notified - na   Chest x-ray - na EKG -  DOS, 05/04/2024 Stress Test - ECHO - 07/01/2017 Cardiac Cath -   Sleep Study/sleep apnea/CPAP: states diagnosed with sleep apnea, no CPAP  Type II diabetic. CGM Dexcom 7 to left arm. A1C 8.5 on 04/05/2024 Fasting Blood sugar range: 120-135 How often check sugars: continuous Metformin , hold DOS Novolog , zero units DOS, use 1/2 correction dose for blood sugar over 220 Lantus , 20 units the night before surgery, which is 50% of regular dose  GLP1: Trulicity , states last dose 04/23/2024   Blood Thinner Instructions: denies Aspirin Instructions:denies   ERAS Protcol - NPO   Anesthesia review: Yes. BMI 52, HTN, DM   Patient denies shortness of breath, fever, cough and chest pain over the phone call  Your procedure is scheduled on Friday May 04, 2024  Report to [facility] Main Entrance A at 0615   A.M., then check in with the Admitting office.  Call this number if you have problems the morning of surgery:  [phone]   If you have any questions prior to your surgery date call [phone]: Open Monday-Friday 8am-4pm If you experience any cold or flu symptoms such as cough, fever, chills, shortness of breath, etc. between now and your scheduled surgery, please notify us  at the above number    Remember:  Do not eat or drink after midnight the night before your surgery  Take these medicines the morning of surgery with A SIP OF WATER:  Amlodipine , atorvastatin , cymbalta , gabapentin   As needed: Allopurinol , reglan , percocet  As of today, STOP taking any Aspirin (unless otherwise instructed by your surgeon) Aleve , Naproxen , Ibuprofen , Motrin , Advil , Goody's, BC's, all herbal medications, fish oil, and all  vitamins.

## 2024-05-03 NOTE — Anesthesia Preprocedure Evaluation (Signed)
 Anesthesia Evaluation    Airway        Dental   Pulmonary former smoker          Cardiovascular hypertension,      Neuro/Psych    GI/Hepatic   Endo/Other  diabetes    Renal/GU      Musculoskeletal   Abdominal   Peds  Hematology   Anesthesia Other Findings   Reproductive/Obstetrics                              Anesthesia Physical Anesthesia Plan  ASA:   Anesthesia Plan:    Post-op Pain Management:    Induction:   PONV Risk Score and Plan:   Airway Management Planned:   Additional Equipment:   Intra-op Plan:   Post-operative Plan:   Informed Consent:   Plan Discussed with:   Anesthesia Plan Comments: (PAT note written 05/03/2024 by Elainna Eshleman, PA-C.  )        Anesthesia Quick Evaluation

## 2024-05-03 NOTE — Progress Notes (Signed)
 Anesthesia Chart Review: [name]  Case: [id number] Date/Time: 05/04/24 0830   Procedure: DENTAL RESTORATION/EXTRACTIONS   Anesthesia type: General   Pre-op diagnosis: NON RESTORABLE   Location: [facility] ROOM 04 / [facility]   Surgeons: Sheryle Hamilton, DMD       DISCUSSION: Patient is a 58 year old male scheduled for the above procedure.  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  A1c 8.5% on 04/05/2024. He is on metformin , Novolog  TID with meals, Lantus , Trulicity . He reported fasting glucose readings ~ 120-135. He has a Dexcom 7 CGM. Last Trulicity  04/23/2024.   He is a same day work-up.  Updated labs and EKG on arrival as indicated.  Anesthesia team to evaluate on the day of surgery.   VS: Ht 5' 8 (1.727 m)   Wt (!) 155.1 kg   BMI 52.00 kg/m  BP Readings from Last 3 Encounters:  04/05/24 122/78  03/14/24 133/84  01/05/24 121/87   Pulse Readings from Last 3 Encounters:  04/05/24 71  03/14/24 94  01/05/24 (!) 102    PROVIDERS: Leigh Lung, MD is PCP  Therisa Folks, NP is endocrinology provider Watt Rush, MD is urologist Onita Duos, MD is neurologist   LABS: Updated labs on arrival as needed. Most recent lab results in [facility] include: Lab Results  Component Value Date   WBC 10.2 12/20/2023   HGB 16.5 01/03/2024   HCT 51.9 01/03/2024   PLT 266 12/20/2023   GLUCOSE 66 (L) 12/20/2023   ALT 24 12/20/2023   AST 15 12/20/2023   NA 141 12/20/2023   K 4.7 12/20/2023   CL 104 12/20/2023   CREATININE 0.87 12/20/2023   BUN 8 12/20/2023   CO2 23 12/20/2023   TSH 0.922 12/20/2023   HGBA1C 8.0 (H) 12/20/2023     IMAGES: CTA Chest/abd/pelvis 11/10/2022: IMPRESSION: - No evidence of aortic dissection or aneurysm. 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 -Fatty liver. - Large umbilical/periumbilical fat containing hernia stable from the prior exam. This occurs along the inferior aspect of previously placed mesh. No bowel incarceration is noted.   EKG: Last EKG note is > 80 year old. For day of surgery as indicated.   EKG 11/10/2022: Sinus rhythm Consider left atrial enlargement Probable anteroseptal infarct, recent nsst chanes lateral leads Confirmed by Levander Houston [phone]) on 11/11/2022 10:39:15 AM   CV: Echo 07/01/1017: Study Conclusions  - Left ventricle: The cavity size was normal. There was severe    focal basal hypertrophy of the septum. Systolic function was    normal. The estimated ejection fraction was in the range of 60%    to 65%. Wall motion was normal; there were no regional wall    motion abnormalities. Doppler parameters are consistent with    abnormal left ventricular relaxation (grade 1 diastolic    dysfunction).  - Mitral valve: There was trivial regurgitation.   Impressions:  - No cardiac source of emboli was indentified.   Past Medical History:  Diagnosis Date   Arthritis    Diabetes mellitus without complication (HCC)    diagnosed about age 30   Gout    Hypertension    boarderline   Sleep apnea    no CPAP    Past Surgical History:  Procedure Laterality Date   HERNIA REPAIR  2008   MASS  EXCISION Left age 56   L thigh- benign    MEDICATIONS: No current facility-administered medications for this encounter.    allopurinol  (ZYLOPRIM ) 300 MG tablet   amLODipine  (NORVASC ) 10 MG tablet   atorvastatin  (LIPITOR) 80 MG tablet   Dulaglutide  (TRULICITY ) 4.5 MG/0.5ML SOAJ   DULoxetine  (CYMBALTA ) 60 MG capsule   furosemide  (LASIX ) 20 MG tablet   gabapentin  (NEURONTIN ) 300 MG capsule   insulin  aspart (NOVOLOG  FLEXPEN) 100 UNIT/ML FlexPen   insulin  glargine (LANTUS ) 100 UNIT/ML injection   LINZESS 145 MCG CAPS capsule   lisinopril  (ZESTRIL ) 20 MG tablet    metFORMIN  (GLUCOPHAGE -XR) 500 MG 24 hr tablet   metoCLOPramide  (REGLAN ) 10 MG tablet   oxyCODONE -acetaminophen  (PERCOCET) 10-325 MG tablet   sildenafil  (VIAGRA ) 100 MG tablet   tadalafil  (CIALIS ) 5 MG tablet   testosterone  cypionate (DEPOTESTOSTERONE CYPIONATE) 200 MG/ML injection   Continuous Glucose Receiver (DEXCOM G7 RECEIVER) DEVI   Continuous Glucose Sensor (DEXCOM G7 SENSOR) MISC   glucose blood (TRUE METRIX BLOOD GLUCOSE TEST) test strip   glucose blood test strip   Insulin  Pen Needle (PEN NEEDLES) 31G X 8 MM MISC   NEEDLE, DISP, 18 G 18G X 1-1/2 MISC   SYRINGE-NEEDLE, DISP, 3 ML 22G X 1 3 ML MISC    Isaiah Ruder, PA-C Surgical Short Stay/Anesthesiology [facility] Phone [phone] [facility] Phone [phone] 05/03/2024 9:43 AM

## 2024-05-04 ENCOUNTER — Encounter (HOSPITAL_COMMUNITY): Payer: Self-pay | Admitting: Oral Surgery

## 2024-05-04 ENCOUNTER — Encounter (HOSPITAL_COMMUNITY): Payer: Self-pay | Admitting: Vascular Surgery

## 2024-05-04 ENCOUNTER — Encounter (HOSPITAL_COMMUNITY)
Admission: RE | Disposition: A | Discharge status: Home / Self Care | Payer: Self-pay | Source: Home / Self Care | Attending: Oral Surgery

## 2024-05-04 ENCOUNTER — Other Ambulatory Visit: Payer: Self-pay

## 2024-05-04 ENCOUNTER — Ambulatory Visit (HOSPITAL_COMMUNITY)
Admission: RE | Admit: 2024-05-04 | Discharge: 2024-05-04 | Disposition: A | Discharge status: Home / Self Care | Attending: Oral Surgery | Admitting: Oral Surgery

## 2024-05-04 ENCOUNTER — Ambulatory Visit (HOSPITAL_COMMUNITY)

## 2024-05-04 ENCOUNTER — Ambulatory Visit (HOSPITAL_COMMUNITY): Payer: Self-pay | Admitting: Vascular Surgery

## 2024-05-04 DIAGNOSIS — J449 Chronic obstructive pulmonary disease, unspecified: Secondary | ICD-10-CM | POA: Insufficient documentation

## 2024-05-04 DIAGNOSIS — K0850 Unsatisfactory restoration of tooth, unspecified: Secondary | ICD-10-CM

## 2024-05-04 DIAGNOSIS — E1165 Type 2 diabetes mellitus with hyperglycemia: Secondary | ICD-10-CM

## 2024-05-04 DIAGNOSIS — Z7984 Long term (current) use of oral hypoglycemic drugs: Secondary | ICD-10-CM | POA: Insufficient documentation

## 2024-05-04 DIAGNOSIS — K760 Fatty (change of) liver, not elsewhere classified: Secondary | ICD-10-CM | POA: Insufficient documentation

## 2024-05-04 DIAGNOSIS — Z79899 Other long term (current) drug therapy: Secondary | ICD-10-CM | POA: Insufficient documentation

## 2024-05-04 DIAGNOSIS — Z6841 Body Mass Index (BMI) 40.0 and over, adult: Secondary | ICD-10-CM | POA: Insufficient documentation

## 2024-05-04 DIAGNOSIS — I10 Essential (primary) hypertension: Secondary | ICD-10-CM | POA: Diagnosis not present

## 2024-05-04 DIAGNOSIS — Z794 Long term (current) use of insulin: Secondary | ICD-10-CM | POA: Diagnosis not present

## 2024-05-04 DIAGNOSIS — E119 Type 2 diabetes mellitus without complications: Secondary | ICD-10-CM | POA: Diagnosis not present

## 2024-05-04 DIAGNOSIS — Z87891 Personal history of nicotine dependence: Secondary | ICD-10-CM | POA: Insufficient documentation

## 2024-05-04 DIAGNOSIS — K029 Dental caries, unspecified: Secondary | ICD-10-CM | POA: Insufficient documentation

## 2024-05-04 DIAGNOSIS — K429 Umbilical hernia without obstruction or gangrene: Secondary | ICD-10-CM | POA: Diagnosis not present

## 2024-05-04 DIAGNOSIS — M278 Other specified diseases of jaws: Secondary | ICD-10-CM | POA: Insufficient documentation

## 2024-05-04 DIAGNOSIS — E279 Disorder of adrenal gland, unspecified: Secondary | ICD-10-CM | POA: Insufficient documentation

## 2024-05-04 DIAGNOSIS — G4730 Sleep apnea, unspecified: Secondary | ICD-10-CM | POA: Insufficient documentation

## 2024-05-04 HISTORY — DX: Sleep apnea, unspecified: G47.30

## 2024-05-04 HISTORY — PX: TOOTH EXTRACTION: SHX859

## 2024-05-04 LAB — BASIC METABOLIC PANEL WITH GFR
Anion gap: 10 (ref 5–15)
BUN: 9 mg/dL (ref 6–20)
CO2: 24 mmol/L (ref 22–32)
Calcium: 8.4 mg/dL — ABNORMAL LOW (ref 8.9–10.3)
Chloride: 103 mmol/L (ref 98–111)
Creatinine, Ser: 0.78 mg/dL (ref 0.61–1.24)
GFR, Estimated: >60 mL/min (ref >60)
Glucose, Bld: 124 mg/dL — ABNORMAL HIGH (ref 70–99)
Potassium: 4 mmol/L (ref 3.5–5.1)
Sodium: 137 mmol/L (ref 135–145)

## 2024-05-04 LAB — GLUCOSE, CAPILLARY
Glucose-Capillary: 119 mg/dL — ABNORMAL HIGH (ref 70–99)
Glucose-Capillary: 193 mg/dL — ABNORMAL HIGH (ref 70–99)

## 2024-05-04 LAB — CBC
HCT: 46.4 % (ref 39.0–52.0)
Hemoglobin: 14.6 g/dL (ref 13.0–17.0)
MCH: 27.4 pg (ref 26.0–34.0)
MCHC: 31.5 g/dL (ref 30.0–36.0)
MCV: 87.1 fL (ref 80.0–100.0)
Platelets: 215 K/uL (ref 150–400)
RBC: 5.33 MIL/uL (ref 4.22–5.81)
RDW: 15.3 % (ref 11.5–15.5)
WBC: 11.9 K/uL — ABNORMAL HIGH (ref 4.0–10.5)
nRBC: 0 % (ref 0.0–0.2)

## 2024-05-04 SURGERY — DENTAL RESTORATION/EXTRACTIONS
Anesthesia: General | Site: Mouth

## 2024-05-04 MED ORDER — OXYMETAZOLINE HCL 0.05 % NA SOLN
NASAL | Status: DC | PRN
Start: 2024-05-04 — End: 2024-05-04
  Administered 2024-05-04: 1 via TOPICAL

## 2024-05-04 MED ORDER — OXYCODONE HCL 5 MG/5ML PO SOLN: 5.0000 mg | Freq: Once | ORAL | Status: AC | PRN

## 2024-05-04 MED ORDER — PROPOFOL 10 MG/ML IV BOLUS
INTRAVENOUS | Status: DC | PRN
  Administered 2024-05-04: 250 mg via INTRAVENOUS

## 2024-05-04 MED ORDER — ROCURONIUM BROMIDE 10 MG/ML (PF) SYRINGE
PREFILLED_SYRINGE | INTRAVENOUS | Status: AC
  Filled 2024-05-04: qty 10

## 2024-05-04 MED ORDER — ROCURONIUM BROMIDE 10 MG/ML (PF) SYRINGE
PREFILLED_SYRINGE | INTRAVENOUS | Status: DC | PRN
  Administered 2024-05-04: 40 mg via INTRAVENOUS

## 2024-05-04 MED ORDER — 0.9 % SODIUM CHLORIDE (POUR BTL) OPTIME
TOPICAL | Status: DC | PRN
Start: 2024-05-04 — End: 2024-05-04
  Administered 2024-05-04: 1000 mL

## 2024-05-04 MED ORDER — ACETAMINOPHEN 10 MG/ML IV SOLN
INTRAVENOUS | Status: AC
Start: 2024-05-04 — End: 2024-05-04
  Filled 2024-05-04: qty 100

## 2024-05-04 MED ORDER — OXYCODONE HCL 5 MG PO TABS
ORAL_TABLET | ORAL | Status: AC
  Filled 2024-05-04: qty 1

## 2024-05-04 MED ORDER — MIDAZOLAM HCL 2 MG/2ML IJ SOLN
INTRAMUSCULAR | Status: AC
  Filled 2024-05-04: qty 2

## 2024-05-04 MED ORDER — LACTATED RINGERS IV SOLN: INTRAVENOUS | Status: DC

## 2024-05-04 MED ORDER — MIDAZOLAM HCL 2 MG/2ML IJ SOLN
INTRAMUSCULAR | Status: DC | PRN
  Administered 2024-05-04: 2 mg via INTRAVENOUS

## 2024-05-04 MED ORDER — ONDANSETRON HCL 4 MG/2ML IJ SOLN
INTRAMUSCULAR | Status: DC | PRN
  Administered 2024-05-04: 4 mg via INTRAVENOUS

## 2024-05-04 MED ORDER — LIDOCAINE 2% (20 MG/ML) 5 ML SYRINGE
INTRAMUSCULAR | Status: DC | PRN
  Administered 2024-05-04: 100 mg via INTRAVENOUS

## 2024-05-04 MED ORDER — PROPOFOL 10 MG/ML IV BOLUS
INTRAVENOUS | Status: AC
  Filled 2024-05-04: qty 20

## 2024-05-04 MED ORDER — ONDANSETRON HCL 4 MG/2ML IJ SOLN: 4.0000 mg | Freq: Once | INTRAMUSCULAR | Status: DC | PRN

## 2024-05-04 MED ORDER — SODIUM CHLORIDE 0.9 % IR SOLN
Status: DC | PRN
Start: 2024-05-04 — End: 2024-05-04
  Administered 2024-05-04: 1

## 2024-05-04 MED ORDER — LIDOCAINE-EPINEPHRINE 2 %-1:100000 IJ SOLN
INTRAMUSCULAR | Status: DC | PRN
Start: 2024-05-04 — End: 2024-05-04
  Administered 2024-05-04: 20 mL

## 2024-05-04 MED ORDER — EPHEDRINE 5 MG/ML INJ
INTRAVENOUS | Status: AC
Start: 2024-05-04 — End: 2024-05-04
  Filled 2024-05-04: qty 5

## 2024-05-04 MED ORDER — OXYCODONE HCL 5 MG PO TABS
5.0000 mg | ORAL_TABLET | Freq: Four times a day (QID) | ORAL | 0 refills | Status: DC | PRN
  Filled 2024-05-05: qty 16, 4d supply, fill #0

## 2024-05-04 MED ORDER — OXYMETAZOLINE HCL 0.05 % NA SOLN
NASAL | Status: DC | PRN
  Administered 2024-05-04 (×2): 2 via NASAL

## 2024-05-04 MED ORDER — FENTANYL CITRATE (PF) 100 MCG/2ML IJ SOLN
25.0000 ug | INTRAMUSCULAR | Status: DC | PRN
  Administered 2024-05-04: 50 ug via INTRAVENOUS
  Administered 2024-05-04 (×2): 25 ug via INTRAVENOUS

## 2024-05-04 MED ORDER — ACETAMINOPHEN 10 MG/ML IV SOLN
1000.0000 mg | Freq: Once | INTRAVENOUS | Status: DC | PRN
  Administered 2024-05-04: 1000 mg via INTRAVENOUS

## 2024-05-04 MED ORDER — SUCCINYLCHOLINE CHLORIDE 200 MG/10ML IV SOSY
PREFILLED_SYRINGE | INTRAVENOUS | Status: DC | PRN
  Administered 2024-05-04: 200 mg via INTRAVENOUS

## 2024-05-04 MED ORDER — OXYCODONE HCL 5 MG PO TABS
5.0000 mg | ORAL_TABLET | Freq: Once | ORAL | Status: AC | PRN
  Administered 2024-05-04: 5 mg via ORAL

## 2024-05-04 MED ORDER — ORAL CARE MOUTH RINSE: 15.0000 mL | Freq: Once | OROMUCOSAL | Status: AC

## 2024-05-04 MED ORDER — FENTANYL CITRATE (PF) 100 MCG/2ML IJ SOLN
INTRAMUSCULAR | Status: AC
Start: 2024-05-04 — End: 2024-05-04
  Filled 2024-05-04: qty 2

## 2024-05-04 MED ORDER — AMOXICILLIN 500 MG PO CAPS: 500.0000 mg | ORAL_CAPSULE | Freq: Three times a day (TID) | ORAL | 0 refills | Status: DC

## 2024-05-04 MED ORDER — EPHEDRINE SULFATE (PRESSORS) 50 MG/ML IJ SOLN
INTRAMUSCULAR | Status: DC | PRN
  Administered 2024-05-04 (×3): 5 mg via INTRAVENOUS

## 2024-05-04 MED ORDER — SUCCINYLCHOLINE CHLORIDE 200 MG/10ML IV SOSY
PREFILLED_SYRINGE | INTRAVENOUS | Status: AC
  Filled 2024-05-04: qty 10

## 2024-05-04 MED ORDER — PHENYLEPHRINE 80 MCG/ML (10ML) SYRINGE FOR IV PUSH (FOR BLOOD PRESSURE SUPPORT)
PREFILLED_SYRINGE | INTRAVENOUS | Status: DC | PRN
  Administered 2024-05-04: 160 ug via INTRAVENOUS
  Administered 2024-05-04 (×3): 80 ug via INTRAVENOUS
  Administered 2024-05-04: 160 ug via INTRAVENOUS
  Administered 2024-05-04: 80 ug via INTRAVENOUS

## 2024-05-04 MED ORDER — CHLORHEXIDINE GLUCONATE 0.12 % MT SOLN
15.0000 mL | Freq: Once | OROMUCOSAL | Status: AC
  Administered 2024-05-04: 15 mL via OROMUCOSAL
  Filled 2024-05-04: qty 15

## 2024-05-04 MED ORDER — FENTANYL CITRATE (PF) 250 MCG/5ML IJ SOLN
INTRAMUSCULAR | Status: DC | PRN
  Administered 2024-05-04: 100 ug via INTRAVENOUS
  Administered 2024-05-04: 50 ug via INTRAVENOUS

## 2024-05-04 MED ORDER — DEXAMETHASONE SODIUM PHOSPHATE 10 MG/ML IJ SOLN
INTRAMUSCULAR | Status: DC | PRN
  Administered 2024-05-04: 5 mg via INTRAVENOUS

## 2024-05-04 MED ORDER — FENTANYL CITRATE (PF) 250 MCG/5ML IJ SOLN
INTRAMUSCULAR | Status: AC
  Filled 2024-05-04: qty 5

## 2024-05-04 MED ORDER — DEXAMETHASONE SODIUM PHOSPHATE 10 MG/ML IJ SOLN
INTRAMUSCULAR | Status: AC
  Filled 2024-05-04: qty 1

## 2024-05-04 MED ORDER — OXYCODONE HCL 5 MG PO TABS: 5.0000 mg | ORAL_TABLET | ORAL | 0 refills | Status: DC | PRN

## 2024-05-04 MED ORDER — ONDANSETRON HCL 4 MG/2ML IJ SOLN
INTRAMUSCULAR | Status: AC
  Filled 2024-05-04: qty 2

## 2024-05-04 MED ORDER — DEXMEDETOMIDINE HCL IN NACL 80 MCG/20ML IV SOLN
INTRAVENOUS | Status: DC | PRN
  Administered 2024-05-04: 8 ug via INTRAVENOUS

## 2024-05-04 MED ORDER — INSULIN ASPART 100 UNIT/ML IJ SOLN: 0.0000 [IU] | INTRAMUSCULAR | Status: DC | PRN

## 2024-05-04 MED ORDER — CEFAZOLIN SODIUM-DEXTROSE 3-4 GM/150ML-% IV SOLN
3.0000 g | INTRAVENOUS | Status: AC
  Administered 2024-05-04: 3 g via INTRAVENOUS
  Filled 2024-05-04: qty 150

## 2024-05-04 MED ORDER — LIDOCAINE 2% (20 MG/ML) 5 ML SYRINGE
INTRAMUSCULAR | Status: AC
  Filled 2024-05-04: qty 5

## 2024-05-04 MED ORDER — IBUPROFEN 800 MG PO TABS: 800.0000 mg | ORAL_TABLET | Freq: Three times a day (TID) | ORAL | 0 refills | Status: DC | PRN

## 2024-05-04 MED ORDER — SUGAMMADEX SODIUM 200 MG/2ML IV SOLN
INTRAVENOUS | Status: DC | PRN
  Administered 2024-05-04: 200 mg via INTRAVENOUS
  Administered 2024-05-04: 150 mg via INTRAVENOUS

## 2024-05-04 SURGICAL SUPPLY — 26 items
BAG COUNTER SPONGE SURGICOUNT (BAG) IMPLANT
BLADE SURG 15 STRL LF DISP TIS (BLADE) ×2 IMPLANT
BUR CROSS CUT FISSURE 1.6 (BURR) ×2 IMPLANT
BUR EGG ELITE 4.0 (BURR) IMPLANT
CANISTER SUCTION 3000ML PPV (SUCTIONS) ×2 IMPLANT
COVER SURGICAL LIGHT HANDLE (MISCELLANEOUS) ×2 IMPLANT
GAUZE PACKING FOLDED 2 STR (GAUZE/BANDAGES/DRESSINGS) ×2 IMPLANT
GLOVE BIO SURGEON STRL SZ8 (GLOVE) ×2 IMPLANT
GOWN STRL REUS W/ TWL LRG LVL3 (GOWN DISPOSABLE) ×2 IMPLANT
GOWN STRL REUS W/ TWL XL LVL3 (GOWN DISPOSABLE) ×2 IMPLANT
IV NS 1000ML BAXH (IV SOLUTION) ×2 IMPLANT
KIT BASIN OR (CUSTOM PROCEDURE TRAY) ×2 IMPLANT
KIT TURNOVER KIT B (KITS) ×2 IMPLANT
NDL HYPO 25GX1X1/2 BEV (NEEDLE) ×4 IMPLANT
NEEDLE HYPO 25GX1X1/2 BEV (NEEDLE) ×2 IMPLANT
NS IRRIG 1000ML POUR BTL (IV SOLUTION) ×2 IMPLANT
PAD ARMBOARD POSITIONER FOAM (MISCELLANEOUS) ×2 IMPLANT
SLEEVE IRRIGATION ELITE 7 (MISCELLANEOUS) ×2 IMPLANT
SPIKE FLUID TRANSFER (MISCELLANEOUS) IMPLANT
SUT CHROMIC 3 0 SH 27 (SUTURE) IMPLANT
SUT PLAIN 3 0 PS2 27 (SUTURE) IMPLANT
SYR BULB IRRIG 60ML STRL (SYRINGE) ×2 IMPLANT
SYR CONTROL 10ML LL (SYRINGE) ×2 IMPLANT
TRAY ENT MC OR (CUSTOM PROCEDURE TRAY) ×2 IMPLANT
TUBING IRRIGATION (MISCELLANEOUS) ×2 IMPLANT
YANKAUER SUCT BULB TIP NO VENT (SUCTIONS) ×2 IMPLANT

## 2024-05-04 NOTE — Transfer of Care (Signed)
 Immediate Anesthesia Transfer of Care Note  Patient: [name]  Procedure(s) Performed: Dental Extractions of teeth number two, three, four, five, seven eight, nine, ten, elven, twelve, sixteen, seventeen, nineteen, twenty, twenty-nine, thirty, thirty-two; Alveoloplasty (Mouth)  Patient Location: PACU  Anesthesia Type:General  Level of Consciousness: drowsy  Airway & Oxygen Therapy: Patient Spontanous Breathing and Patient connected to nasal cannula oxygen  Post-op Assessment: Report given to RN and Post -op Vital signs reviewed and stable  Post vital signs: Reviewed and stable  Last Vitals:  Vitals Value Taken Time  BP 170/103 05/04/24 10:57  Temp    Pulse 98 05/04/24 11:00  Resp 15 05/04/24 11:00  SpO2 100 % 05/04/24 11:00  Vitals shown include unfiled device data.  Last Pain:  Vitals:   05/04/24 0708  PainSc: 0-No pain      Patients Stated Pain Goal: 0 (05/04/24 0708)  Complications: No notable events documented.

## 2024-05-04 NOTE — H&P (Signed)
 H&P documentation  -History and Physical Reviewed  -Patient has been re-examined  -No change in the plan of care  [name]

## 2024-05-04 NOTE — Op Note (Signed)
 NAME: [name], PUGH MEDICAL RECORD NO: [id number] ACCOUNT NO: [id number] DATE OF BIRTH: March 13, 1966 FACILITY[facility] LOCATION: [facility] PHYSICIAN: Glendia EMERSON Primrose, DDS  Operative Report   DATE OF PROCEDURE: 05/04/2024  PREOPERATIVE DIAGNOSES: 1.  Nonrestorable teeth #2, #4, #5, #6, #7, #8, #9, #10, #11, #12, #16, #17, #19, #20, #29, #30, and #32.  POSTOPERATIVE DIAGNOSES: 1.  Nonrestorable teeth #2, #4, #5, #6, #7, #8, #9, #10, #11, #12, #16, #17, #19, #20, #29, #30, and #32. 2.  Exostosis, right maxilla. 3.  Tooth fragment, tooth #14.  PROCEDURES PERFORMED: 1.  Extraction of teeth #2, #4, #5, #6, #7, #8, #9, #10, #11, #12, #14, #16, #17, #19, #20, #29, #30, and #32. 2.  Alveoloplasty, right and left maxilla and mandible. 3.  Removal of right maxillary exostosis.  SURGEON:  Glendia EMERSON Primrose, DDS  ANESTHESIA:  General, Dr. Darlyn attending, nasal intubation.  DESCRIPTION OF PROCEDURE:  The patient was taken to the operating room.  A nasal endo tube was placed and secured.  The eyes were protected and the patient was draped for surgery.  A timeout was performed.  The posterior pharynx was suctioned and a  throat pack was placed.  2% lidocaine  with 1:100,000 epinephrine  was infiltrated in an inferior alveolar block on the right and left sides and then buccal and passive infiltration in the maxilla.  The left side was operated first.  A #15 blade was used  to make an incision around teeth #17, #18, and #19.  The periosteum was reflected with the periosteal elevator.  The teeth were elevated, but could not be removed.  The Stryker handpiece with fissure bur was used under irrigation to remove bone around  the circumference of the teeth.  The teeth were then elevated with a 301 elevator and removed from the mouth with the dental forceps.  The sockets were curetted.  The periosteum was reflected to expose the alveolar crest, which was irregular in contour.   Alveoloplasty was performed using the Egg  bur followed by the bone file and then the area was closed with 3-0 chromic.  Then, in the maxilla, the #15 blade was used to make an incision around teeth #16 and #15.  Then attention was carried forward around  teeth #12, #11, #10, #9, #8, and #7.  The periosteum was reflected from around these teeth.  Tooth #16 was removed using the 301 elevator and the dental forceps.  Tooth #15 was a tooth fragment, which was removed with a rongeur.  Then, teeth #12, #11,  #10, #8, and #7 could not be removed with the forceps and elevation.  The Stryker handpiece was then used with a fissure bur to remove bone around the roots of the teeth so the teeth could be accessed and then removed with a 301 elevator.  The sockets  were curetted.  The periosteum was reflected.  The left maxilla was irregular in contour.  Alveoloplasty was performed using the Egg burr followed by the bone file.  Then, the area was irrigated and closed with 3-0 chromic.  Attention was turned to the  right side of the mouth.  A #15 blade was used to make an incision around teeth #29, #30, and #32.  The periosteum was reflected.  The teeth were elevated, but could not be removed.  The Stryker handpiece was used with a fissure bur again to remove bone  around the roots and the teeth were removed with a 301 elevator.  Then, the sockets were  curetted.  Alveoloplasty was performed and then the area was closed with 3-0 chromic.  In the right maxilla, a #15 blade was used to make an incision at the  distal-most region of the maxilla.  At the tuberosity region, it was carried forward around the root fragment of tooth #1 over the edentulous space to tooth #4, #5, and #6.  Then, the incision was created buccally and palatally in the gingival sulcus.   The periosteum was reflected.  The teeth were elevated.  Tooth #1 was removed with a rongeur.  Teeth #4, #5, and #6 required removal of interproximal bone with a Stryker handpiece and fissure bur.  The teeth were  then elevated and removed from the mouth.   The sockets were curetted.  Alveoloplasty was performed and the exostosis in the right buccal aspect around #1 was removed with the Stryker handpiece and egg bur.  Then, the area was irrigated and closed with 3-0 chromic.  The oral cavity was irrigated  and suctioned.  The throat pack was removed.  The patient was turned to the care of anesthesia for extubation and transport to recovery with plans for discharge home through day surgery.    ESTIMATED BLOOD LOSS:  Minimum.  COMPLICATIONS:  None.  SPECIMENS:  None.  COUNTS:  Correct.    MUK D: 05/04/2024 10:34:31 am T: 05/04/2024 11:57:00 pm  JOB: [id number]/ [id number]

## 2024-05-04 NOTE — Anesthesia Postprocedure Evaluation (Signed)
 Anesthesia Post Note  Patient: [name]  Procedure(s) Performed: Dental Extractions of teeth number two, three, four, five, seven eight, nine, ten, elven, twelve, sixteen, seventeen, nineteen, twenty, twenty-nine, thirty, thirty-two; Alveoloplasty (Mouth)     Patient location during evaluation: PACU Anesthesia Type: General Level of consciousness: awake and alert Pain management: pain level controlled Vital Signs Assessment: post-procedure vital signs reviewed and stable Respiratory status: spontaneous breathing, nonlabored ventilation, respiratory function stable and patient connected to nasal cannula oxygen Cardiovascular status: blood pressure returned to baseline and stable Postop Assessment: no apparent nausea or vomiting Anesthetic complications: no   No notable events documented.  Last Vitals:  Vitals:   05/04/24 1100 05/04/24 1115  BP: (!) 174/101 (!) 165/101  Pulse: 98 98  Resp: 15 17  Temp:    SpO2: 100% 97%    Last Pain:  Vitals:   05/04/24 1130  PainSc: Asleep                 Rome Ade

## 2024-05-04 NOTE — Anesthesia Procedure Notes (Signed)
 Procedure Name: Intubation Date/Time: 05/04/2024 9:19 AM  Performed by: Thomasina Laurence GRADE, RNPre-anesthesia Checklist: Patient identified, Emergency Drugs available, Suction available and Patient being monitored Patient Re-evaluated:Patient Re-evaluated prior to induction Oxygen Delivery Method: Circle system utilized Preoxygenation: Pre-oxygenation with 100% oxygen Induction Type: IV induction Ventilation: Mask ventilation without difficulty Laryngoscope Size: Glidescope (s3) Grade View: Grade I Nasal Tubes: Nasal prep performed and Nasal Rae Tube size: 7.5 mm Number of attempts: 1 Placement Confirmation: ETT inserted through vocal cords under direct vision, positive ETCO2 and breath sounds checked- equal and bilateral Tube secured with: Tape Dental Injury: Bloody posterior oropharynx

## 2024-05-04 NOTE — Op Note (Signed)
 05/04/2024  10:25 AM  PATIENT:  [name]  58 y.o. male  PRE-OPERATIVE DIAGNOSIS:  NON RESTORABLE teeth number two,  four, five, six, seven eight, nine, ten, eleven, twelve, sixteen, seventeen, nineteen, twenty, twenty-nine, thirty, thirty-two  POST-OPERATIVE DIAGNOSIS:  SAME + Exostosis right maxilla, tooth fragment # 14  PROCEDURE:  Procedure(s): Dental Extractions of teeth number two,  four, five, six, seven eight, nine, ten, eleven, twelve, fifteen,sixteen, seventeen, nineteen, twenty, twenty-nine, thirty, thirty-two; Alveoloplasty. Removal right maxillary exostosis  SURGEON:  Surgeon(s): Sheryle Hamilton, DMD  ANESTHESIA:   local and general  EBL:  minimal  DRAINS: none   SPECIMEN:  No Specimen  COUNTS:  YES  PLAN OF CARE: Discharge to home after PACU  PATIENT DISPOSITION:  PACU - hemodynamically stable.   PROCEDURE DETAILS: Dictation # [id number]  Hamilton EMERSON Sheryle, DMD 05/04/2024 10:25 AM

## 2024-05-05 ENCOUNTER — Other Ambulatory Visit (HOSPITAL_COMMUNITY): Payer: Self-pay

## 2024-05-08 ENCOUNTER — Encounter (HOSPITAL_COMMUNITY): Payer: Self-pay | Admitting: Oral Surgery

## 2024-05-08 ENCOUNTER — Other Ambulatory Visit: Payer: Self-pay | Admitting: Orthopaedic Surgery

## 2024-05-08 NOTE — Telephone Encounter (Signed)
 Dr. Janae pt - pt lvm today requesting a refill for Oxycodone  10-325 to be sent to [facility].

## 2024-05-09 ENCOUNTER — Encounter: Payer: Self-pay | Admitting: Orthopaedic Surgery

## 2024-05-09 ENCOUNTER — Ambulatory Visit: Admitting: Orthopaedic Surgery

## 2024-05-09 DIAGNOSIS — M545 Low back pain, unspecified: Secondary | ICD-10-CM | POA: Diagnosis not present

## 2024-05-09 DIAGNOSIS — Z6841 Body Mass Index (BMI) 40.0 and over, adult: Secondary | ICD-10-CM

## 2024-05-09 NOTE — Progress Notes (Signed)
 My back is sore.  He has chronic lower back pain.  He had dental surgery last week and was given narcotics from that visit.  I told him I could not give Rx this week because of that.  I can resume next week.  He has no weakness, no trauma.  He has pain after standing a while.  Lower back pain is present, limited ROM, muscle tone and strength normal, NV intact, no spasm, good gait.  Encounter Diagnoses  Name Primary?   Lumbar pain Yes   Morbid obesity due to excess calories (HCC)    Body mass index 50.0-59.9, adult (HCC)    Return in three months.  I have informed the patient I will be retiring from medical practice and from this office on June 07, 2024.  The patient has been offered continuing care with Dr. Margrette or Dr. Onesimo of this office.  The patient may choose another provider and the records will be forwarded after proper signature and notification.  Patient understands and agrees.  Call if any problem.  Precautions discussed.  Electronically Signed Lemond Stable, MD 9/3/20253:23 PM

## 2024-05-14 ENCOUNTER — Telehealth: Payer: Self-pay | Admitting: Orthopaedic Surgery

## 2024-05-14 ENCOUNTER — Encounter: Payer: Self-pay | Admitting: Podiatry

## 2024-05-14 ENCOUNTER — Ambulatory Visit (INDEPENDENT_AMBULATORY_CARE_PROVIDER_SITE_OTHER): Admitting: Podiatry

## 2024-05-14 VITALS — Ht 68.0 in | Wt 342.0 lb

## 2024-05-14 DIAGNOSIS — M79675 Pain in left toe(s): Secondary | ICD-10-CM | POA: Diagnosis not present

## 2024-05-14 DIAGNOSIS — B351 Tinea unguium: Secondary | ICD-10-CM | POA: Diagnosis not present

## 2024-05-14 DIAGNOSIS — M79674 Pain in right toe(s): Secondary | ICD-10-CM

## 2024-05-14 MED ORDER — OXYCODONE-ACETAMINOPHEN 10-325 MG PO TABS: 1.0000 | ORAL_TABLET | ORAL | 0 refills | Status: DC | PRN

## 2024-05-14 NOTE — Telephone Encounter (Signed)
 Dr. Sanjuan Dame pt - pt lvm requesting a refill for Oxycodone 10-325 to be sent to [facility] [place]

## 2024-05-14 NOTE — Progress Notes (Signed)
   Chief Complaint  Patient presents with   Nail Problem    Pt is here for [facility].    SUBJECTIVE Patient with a history of diabetes mellitus presents to office today complaining of elongated, thickened nails that cause pain while ambulating in shoes.  Patient is unable to trim their own nails. Patient is here for further evaluation and treatment.  Past Medical History:  Diagnosis Date   Arthritis    Diabetes mellitus without complication (HCC)    diagnosed about age 49   Gout    Hypertension    boarderline   Sleep apnea    no CPAP    No Known Allergies   OBJECTIVE General Patient is awake, alert, and oriented x 3 and in no acute distress. Derm Skin is dry and supple bilateral. Negative open lesions or macerations. Remaining integument unremarkable. Nails are tender, long, thickened and dystrophic with subungual debris, consistent with onychomycosis, 1-5 bilateral. No signs of infection noted. Vasc  DP and PT pedal pulses palpable bilaterally. Temperature gradient within normal limits.  Neuro Epicritic and protective threshold sensation diminished bilaterally.  Musculoskeletal Exam No symptomatic pedal deformities noted bilateral. Muscular strength within normal limits.  ASSESSMENT 1. Diabetes Mellitus w/ peripheral neuropathy 2.  Pain due to onychomycosis of toenails bilateral  PLAN OF CARE 1. Patient evaluated today. 2. Instructed to maintain good pedal hygiene and foot care. Stressed importance of controlling blood sugar.  3. Mechanical debridement of nails 1-5 bilaterally performed using a nail nipper. Filed with dremel without incident.  4. Return to clinic in 3 mos.     Thresa EMERSON Sar, DPM [facility]  Dr. Thresa EMERSON Sar, DPM    [address] [place], [place] [address]                Office [phone]  Fax [phone]

## 2024-05-17 ENCOUNTER — Other Ambulatory Visit: Payer: Self-pay | Admitting: Nurse Practitioner

## 2024-05-21 ENCOUNTER — Telehealth: Payer: Self-pay | Admitting: Orthopaedic Surgery

## 2024-05-21 ENCOUNTER — Other Ambulatory Visit

## 2024-05-21 DIAGNOSIS — E230 Hypopituitarism: Secondary | ICD-10-CM

## 2024-05-21 MED ORDER — OXYCODONE-ACETAMINOPHEN 10-325 MG PO TABS: 1.0000 | ORAL_TABLET | ORAL | 0 refills | Status: DC | PRN

## 2024-05-21 NOTE — Telephone Encounter (Signed)
 Dr. Janae pt - spoke w/the pt, he is requesting a refill for Oxycodone  10-325, 30 tablets, every 4hrs PRN for pain, [facility].  Reminder Dr. MARLA is off this week.

## 2024-05-22 ENCOUNTER — Ambulatory Visit: Payer: Self-pay

## 2024-05-22 LAB — TESTOSTERONE: Testosterone: 635 ng/dL (ref 264–916)

## 2024-05-22 LAB — HEMOGLOBIN AND HEMATOCRIT, BLOOD
Hematocrit: 48.8 % (ref 37.5–51.0)
Hemoglobin: 15.6 g/dL (ref 13.0–17.7)

## 2024-05-23 ENCOUNTER — Ambulatory Visit (INDEPENDENT_AMBULATORY_CARE_PROVIDER_SITE_OTHER): Admitting: Urology

## 2024-05-23 VITALS — BP 153/89 | HR 88

## 2024-05-23 DIAGNOSIS — E230 Hypopituitarism: Secondary | ICD-10-CM | POA: Diagnosis not present

## 2024-05-23 DIAGNOSIS — R351 Nocturia: Secondary | ICD-10-CM | POA: Diagnosis not present

## 2024-05-23 DIAGNOSIS — N5201 Erectile dysfunction due to arterial insufficiency: Secondary | ICD-10-CM | POA: Diagnosis not present

## 2024-05-23 MED ORDER — SILDENAFIL CITRATE 100 MG PO TABS: 100.0000 mg | ORAL_TABLET | Freq: Every day | ORAL | 11 refills | Status: AC | PRN

## 2024-05-23 MED ORDER — TADALAFIL 20 MG PO TABS: 20.0000 mg | ORAL_TABLET | Freq: Every day | ORAL | 5 refills | Status: AC | PRN

## 2024-05-23 MED ORDER — TESTOSTERONE CYPIONATE 200 MG/ML IM SOLN: 100.0000 mg | INTRAMUSCULAR | 1 refills | Status: AC

## 2024-05-23 NOTE — Progress Notes (Signed)
 05/23/2024 1:21 PM   [name] February 06, 1966 [id number]  Referring provider: Leigh Lung, MD [address] [place],  [place] [address]  Followup hypogonadism and erectile dysfunction   HPI: [name] is a 58yo here for followup for erectile dysfunction and hypogonadism. Testosterone  635 and hemoglobin 15.6. Energy is good. He continues to have issues getting a firm erection. He has tried tadalafil  5mg  daily and sildenafil  100mg  prn. IPSS 14 QOl 2 on tadaalfil 5mg  daily. Nocturia 1-3x depending on fluid consumption   PMH: Past Medical History:  Diagnosis Date   Arthritis    Diabetes mellitus without complication (HCC)    diagnosed about age 89   Gout    Hypertension    boarderline   Sleep apnea    no CPAP    Surgical History: Past Surgical History:  Procedure Laterality Date   HERNIA REPAIR  2008   MASS EXCISION Left age 22   L thigh- benign   TOOTH EXTRACTION N/A 05/04/2024   Procedure: Dental Extractions of teeth number two, three, four, five, seven eight, nine, ten, elven, twelve, sixteen, seventeen, nineteen, twenty, twenty-nine, thirty, thirty-two; Alveoloplasty;  Surgeon: [name], DMD;  Location: [facility] OR;  Service: Oral Surgery;  Laterality: N/A;    Home Medications:  Allergies as of 05/23/2024   No Known Allergies      Medication List        Accurate as of May 23, 2024  1:21 PM. If you have any questions, ask your nurse or doctor.          allopurinol  300 MG tablet Commonly known as: ZYLOPRIM  Take 1 tablet (300 mg total) by mouth daily. What changed:  when to take this reasons to take this   amLODipine  10 MG tablet Commonly known as: NORVASC  TAKE 1 Tablet BY MOUTH ONCE EVERY DAY   amoxicillin  500 MG capsule Commonly known as: AMOXIL  Take 1 capsule (500 mg total) by mouth 3 (three) times daily.   atorvastatin  80 MG tablet Commonly known as: LIPITOR TAKE 1 Tablet BY MOUTH ONCE EVERY DAY   Dexcom G7 Receiver Devi USE TO MONITOR  GLUCOSE CONTINUOUSLY AS DIRECTED.   Dexcom G7 Sensor Misc Inject 1 Application into the skin as directed. Change sensor every 10 days as directed.   DULoxetine  60 MG capsule Commonly known as: CYMBALTA  TAKE 1 CAPSULE BY MOUTH EVERY DAY   furosemide  20 MG tablet Commonly known as: LASIX  Take 1 tablet (20 mg total) by mouth daily.   gabapentin  300 MG capsule Commonly known as: NEURONTIN  Take 1 capsule (300 mg total) by mouth 3 (three) times daily.   glucose blood test strip Use as instructed to monitor glucose 4 times daily   True Metrix Blood Glucose Test test strip Generic drug: glucose blood Patient is to use 1 test strip to monitor blood glucose 4 times daily .Use as instructed   ibuprofen  800 MG tablet Commonly known as: ADVIL  Take 1 tablet (800 mg total) by mouth every 8 (eight) hours as needed.   insulin  glargine 100 UNIT/ML injection Commonly known as: Lantus  Inject 0.4 mLs (40 Units total) into the skin daily.   Linzess 145 MCG Caps capsule Generic drug: linaclotide Take 145 mcg by mouth every morning.   lisinopril  20 MG tablet Commonly known as: ZESTRIL  TAKE 1 Tablet BY MOUTH ONCE EVERY DAY   metFORMIN  500 MG 24 hr tablet Commonly known as: GLUCOPHAGE -XR Take 2 tablets (1,000 mg total) by mouth daily with breakfast.  metoCLOPramide  10 MG tablet Commonly known as: REGLAN  Take 1 tablet (10 mg total) by mouth every 8 (eight) hours as needed for nausea.   NEEDLE (DISP) 18 G 18G X 1-1/2 Misc Use to draw up testosterone  for injection   NovoLOG  FlexPen 100 UNIT/ML FlexPen Generic drug: insulin  aspart Inject 10-16 Units into the skin 3 (three) times daily with meals.   oxyCODONE  5 MG immediate release tablet Commonly known as: Oxy IR/ROXICODONE  Take 1 tablet (5 mg total) by mouth every 4 (four) hours as needed.   oxyCODONE  5 MG immediate release tablet Commonly known as: Oxy IR/ROXICODONE  Take 1 tablet (5 mg total) by mouth every 6 (six) hours as  needed.   oxyCODONE -acetaminophen  10-325 MG tablet Commonly known as: PERCOCET Take 1 tablet by mouth every 4 (four) hours as needed for pain.   Pen Needles 31G X 8 MM Misc Use to inject insulin  4 times daily   sildenafil  100 MG tablet Commonly known as: VIAGRA  Take 1 tablet (100 mg total) by mouth daily as needed for erectile dysfunction.   SYRINGE-NEEDLE (DISP) 3 ML 22G X 1 3 ML Misc Use to inject testosterone    tadalafil  5 MG tablet Commonly known as: CIALIS  Take 1 tablet (5 mg total) by mouth daily as needed for erectile dysfunction.   testosterone  cypionate 200 MG/ML injection Commonly known as: DEPOTESTOSTERONE CYPIONATE Inject 0.5 mLs (100 mg total) into the muscle every 7 (seven) days.   Trulicity  4.5 MG/0.5ML Soaj Generic drug: Dulaglutide  Inject 4.5 mg as directed once a week.        Allergies: No Known Allergies  Family History: Family History  Problem Relation Age of Onset   Cancer Mother    Diabetes Mother    Heart failure Father    Hypertension Father    Diabetes Father    Heart attack Father     Social History:  reports that he quit smoking about 35 years ago. His smoking use included cigarettes. He started smoking about 40 years ago. He has a 2.5 [name]-year smoking history. He has never used smokeless tobacco. He reports that he does not drink alcohol and does not use drugs.  ROS: All other review of systems were reviewed and are negative except what is noted above in HPI  Physical Exam: BP (!) 153/89   Pulse 88   Constitutional:  Alert and oriented, No acute distress. HEENT: [place] AT, moist mucus membranes.  Trachea midline, no masses. Cardiovascular: No clubbing, cyanosis, or edema. Respiratory: Normal respiratory effort, no increased work of breathing. GI: Abdomen is soft, nontender, nondistended, no abdominal masses GU: No CVA tenderness.  Lymph: No cervical or inguinal lymphadenopathy. Skin: No rashes, bruises or suspicious  lesions. Neurologic: Grossly intact, no focal deficits, moving all 4 extremities. Psychiatric: Normal mood and affect.  Laboratory Data: Lab Results  Component Value Date   WBC 11.9 (H) 05/04/2024   HGB 15.6 05/21/2024   HCT 48.8 05/21/2024   MCV 87.1 05/04/2024   PLT 215 05/04/2024    Lab Results  Component Value Date   CREATININE 0.78 05/04/2024    No results found for: PSA  Lab Results  Component Value Date   TESTOSTERONE  635 05/21/2024    Lab Results  Component Value Date   HGBA1C 8.0 (H) 12/20/2023    Urinalysis    Component Value Date/Time   COLORURINE YELLOW 11/10/2022 1800   APPEARANCEUR Clear 01/05/2024 1415   LABSPEC 1.019 11/10/2022 1800   PHURINE 6.0 11/10/2022 1800   GLUCOSEU  Negative 01/05/2024 1415   HGBUR NEGATIVE 11/10/2022 1800   BILIRUBINUR Negative 01/05/2024 1415   KETONESUR NEGATIVE 11/10/2022 1800   PROTEINUR 2+ (A) 01/05/2024 1415   PROTEINUR NEGATIVE 11/10/2022 1800   UROBILINOGEN 0.2 09/28/2021 1342   NITRITE Negative 01/05/2024 1415   NITRITE NEGATIVE 11/10/2022 1800   LEUKOCYTESUR Negative 01/05/2024 1415   LEUKOCYTESUR NEGATIVE 11/10/2022 1800    Lab Results  Component Value Date   LABMICR See below: 01/05/2024   WBCUA None seen 01/05/2024   LABEPIT 0-10 01/05/2024   MUCUS Present (A) 01/05/2024   BACTERIA None seen 01/05/2024    Pertinent Imaging:  No results found for this or any previous visit.  No results found for this or any previous visit.  No results found for this or any previous visit.  No results found for this or any previous visit.  No results found for this or any previous visit.  No results found for this or any previous visit.  No results found for this or any previous visit.  No results found for this or any previous visit.   Assessment & Plan:    1. Hypogonadotropic hypogonadism (HCC) (Primary) Continue IM testosterone  100mg  weekly Followup 6 monhts with testosterone  labs  2. Erectile  dysfunction due to arterial insufficiency -sildeanfil 100mg  prn  3. Nocturia -tadalafil  5mg  daily   No follow-ups on file.  Belvie Clara, MD  [facility] [place]

## 2024-05-25 NOTE — Telephone Encounter (Signed)
 Noted

## 2024-05-28 ENCOUNTER — Telehealth: Payer: Self-pay | Admitting: Orthopaedic Surgery

## 2024-05-28 NOTE — Telephone Encounter (Signed)
 Dr. Janae pt - pt lvm requesting for a refill for Oxycodone  10-325 to be sent to [facility] [place]

## 2024-05-29 ENCOUNTER — Encounter: Payer: Self-pay | Admitting: Urology

## 2024-05-29 MED ORDER — TADALAFIL 5 MG PO TABS: 5.0000 mg | ORAL_TABLET | Freq: Every day | ORAL | 11 refills | Status: AC | PRN

## 2024-05-29 MED ORDER — OXYCODONE-ACETAMINOPHEN 10-325 MG PO TABS: 1.0000 | ORAL_TABLET | ORAL | 0 refills | Status: DC | PRN

## 2024-05-29 NOTE — Patient Instructions (Signed)
 Hypogonadism, Male  Male hypogonadism is a condition of having a level of testosterone  that is lower than normal. Testosterone  is a chemical, or hormone, that is made mainly in the testicles. In boys, testosterone  is responsible for the development of male characteristics during puberty. These include: Making the penis bigger. Growing and building the muscles. Growing facial hair. Deepening the voice. In adult men, testosterone  is responsible for maintaining: An interest in sex and the ability to have sex. Muscle mass. Sperm production. Red blood cell production. Bone strength. Testosterone  also gives men energy and a sense of well-being. Testosterone  normally decreases as men age and the testicles make less testosterone . Testosterone  levels can vary from man to man. Not all men will have signs and symptoms of low testosterone . Weight, alcohol use, medicines, and certain medical conditions can affect a man's testosterone  level. What are the causes? This condition is caused by: A natural decrease in testosterone  that occurs as a man grows older. This is the main cause of this condition. Use of medicines, such as antidepressants, steroids, and opioids. Diseases and conditions that affect the testicles or the making of testosterone . These include: Injury or damage to the testicles from trauma, cancer, cancer treatment, or infection. Diabetes. Sleep apnea. Genetic conditions that men are born with. Disease of the pituitary gland. This gland is in the brain. It produces hormones. Obesity. Metabolic syndrome. This is a group of diseases that affect blood pressure, blood sugar, cholesterol, and belly fat. HIV or AIDS. Alcohol abuse. Kidney failure. Other long-term or chronic diseases. What are the signs or symptoms? Common symptoms of this condition include: Loss of interest in sex (low sex drive). Inability to have or maintain an erection (erectile dysfunction). Feeling tired  (fatigue). Mood changes, like irritability or depression. Loss of muscle and body hair. Infertility. Large breasts. Weight gain (obesity). How is this diagnosed? Your health care provider can diagnose hypogonadism based on: Your signs and symptoms. A physical exam to check your testosterone  levels. This includes blood tests. Testosterone  levels can change throughout the day. Levels are highest in the morning. You may need to have repeat blood tests before getting a diagnosis of hypogonadism. Depending on your medical history and test results, your health care provider may also do other tests to find the cause of low testosterone . How is this treated? This condition is treated with testosterone  replacement therapy. Testosterone  can be given by: Injection or through pellets inserted under the skin. Gels or patches placed on the skin or in the mouth. Testosterone  therapy is not for everyone. It has risks and side effects. Your health care provider will consider your medical history, your risk for prostate cancer, your age, and your symptoms before putting you on testosterone  replacement therapy. Follow these instructions at home: Take over-the-counter and prescription medicines only as told by your health care provider. Eat foods that are high in fiber, such as beans, whole grains, and fresh fruits and vegetables. Limit foods that are high in fat and processed sugars, such as fried or sweet foods. If you drink alcohol: Limit how much you have to 0-2 drinks a day. Know how much alcohol is in your drink. In the [place] one drink equals one 12 oz bottle of beer (355 mL), one 5 oz glass of wine (148 mL), or one 1 oz glass of hard liquor (44 mL). Return to your normal activities as told by your health care provider. Ask your health care provider what activities are safe for you. Keep all  follow-up visits. This is important. Contact a health care provider if: You have any of the signs or symptoms of  low testosterone . You have any side effects from testosterone  therapy. Summary Male hypogonadism is a condition of having a level of testosterone  that is lower than normal. The natural drop in testosterone  production that occurs with age is the most common cause of this condition. Low testosterone  can also be caused by many diseases and conditions that affect the testicles and the making of testosterone . This condition is treated with testosterone  replacement therapy. There are risks and side effects of testosterone  therapy. Your health care provider will consider your age, medical history, symptoms, and risks for prostate cancer before putting you on testosterone  therapy. This information is not intended to replace advice given to you by your health care provider. Make sure you discuss any questions you have with your health care provider. Document Revised: 08/01/2023 Document Reviewed: 08/01/2023 Elsevier Patient Education  2025 [employer].

## 2024-06-04 ENCOUNTER — Telehealth: Payer: Self-pay | Admitting: Orthopaedic Surgery

## 2024-06-04 NOTE — Telephone Encounter (Signed)
 Dr. Janae pt - pt lvm requesting for a refill for Oxycodone  10-325 to be sent to [facility] [place]

## 2024-06-05 MED ORDER — OXYCODONE-ACETAMINOPHEN 10-325 MG PO TABS: 1.0000 | ORAL_TABLET | ORAL | 0 refills | Status: DC | PRN

## 2024-06-07 ENCOUNTER — Telehealth: Payer: Self-pay | Admitting: Nurse Practitioner

## 2024-06-07 ENCOUNTER — Telehealth: Payer: Self-pay

## 2024-06-07 ENCOUNTER — Other Ambulatory Visit (HOSPITAL_COMMUNITY): Payer: Self-pay

## 2024-06-07 NOTE — Telephone Encounter (Signed)
 Pt is stating that Trulicity  is needing a PA

## 2024-06-07 NOTE — Telephone Encounter (Signed)
 Pharmacy Patient Advocate Encounter   Received notification from Pt Calls Messages that prior authorization for Trulicity  4.5MG /0.5ML auto-injectors is required/requested.   Insurance verification completed.   The patient is insured through [facility] MEDICAID.   Per test claim: PA required; PA submitted to above mentioned insurance via Latent Key/confirmation #/EOC [id number] Status is pending

## 2024-06-08 NOTE — Telephone Encounter (Signed)
 Addressed in new encounter, called pt and let him know status is pending

## 2024-06-11 ENCOUNTER — Telehealth: Payer: Self-pay

## 2024-06-11 ENCOUNTER — Other Ambulatory Visit: Payer: Self-pay | Admitting: Orthopedic Surgery

## 2024-06-11 NOTE — Telephone Encounter (Signed)
 Oxycodone -Acetaminophen  10/325 MG  Qty 30 Tablets  Take 1 tablet by mouth every 4 (four) hours as needed for pain.   PATIENT US[facility] [place] [facility]

## 2024-06-11 NOTE — Telephone Encounter (Signed)
 Spoke w/the pt and scheduled him an appointment with Dr. JAYSON

## 2024-06-12 ENCOUNTER — Other Ambulatory Visit (INDEPENDENT_AMBULATORY_CARE_PROVIDER_SITE_OTHER): Payer: Self-pay

## 2024-06-12 ENCOUNTER — Other Ambulatory Visit: Payer: Self-pay | Admitting: Orthopedic Surgery

## 2024-06-12 ENCOUNTER — Ambulatory Visit (INDEPENDENT_AMBULATORY_CARE_PROVIDER_SITE_OTHER): Admitting: Orthopedic Surgery

## 2024-06-12 ENCOUNTER — Other Ambulatory Visit: Payer: Self-pay

## 2024-06-12 VITALS — BP 167/102 | HR 92 | Ht 68.0 in | Wt 350.0 lb

## 2024-06-12 DIAGNOSIS — M25562 Pain in left knee: Secondary | ICD-10-CM | POA: Diagnosis not present

## 2024-06-12 DIAGNOSIS — G8929 Other chronic pain: Secondary | ICD-10-CM

## 2024-06-12 DIAGNOSIS — M170 Bilateral primary osteoarthritis of knee: Secondary | ICD-10-CM | POA: Diagnosis not present

## 2024-06-12 DIAGNOSIS — M25561 Pain in right knee: Secondary | ICD-10-CM | POA: Diagnosis not present

## 2024-06-12 MED ORDER — METHOCARBAMOL 500 MG PO TABS: 500.0000 mg | ORAL_TABLET | Freq: Three times a day (TID) | ORAL | 0 refills | Status: AC | PRN

## 2024-06-12 MED ORDER — OXYCODONE-ACETAMINOPHEN 10-325 MG PO TABS: 1.0000 | ORAL_TABLET | ORAL | 0 refills | Status: DC | PRN

## 2024-06-12 NOTE — Patient Instructions (Signed)
 We discussed multiple topics in clinic today.  I can refer you to see a specialist for injections in your back  I am happy to provide injections in your knee.  We discussed steroid injections, as well as gel injections.  If you are interested in gel injections, we will need to obtain authorization through your insurance company.  We will place a referral to a pain management clinic.  I will not continue to provide narcotic prescription medications.  I have sent in a prescription to your pharmacy today.  Follow-up As needed.  Call if interested in injections.

## 2024-06-13 ENCOUNTER — Ambulatory Visit: Admitting: Orthopaedic Surgery

## 2024-06-13 ENCOUNTER — Encounter: Payer: Self-pay | Admitting: Orthopedic Surgery

## 2024-06-13 NOTE — Progress Notes (Signed)
 New Patient Visit  Assessment: [name] is a 58 y.o. male with the following: 1. Chronic pain of right knee 2. Chronic pain of left knee  Plan: [name] has chronic pain in both knees.  In addition, he has chronic back pain.  He has been on narcotics for years.  Repeat radiographs today demonstrates moderate degenerative changes of bilateral knees.  Unfortunately, his BMI is 53, and he is not a candidate for surgery.  This was discussed in clinic today.  He is reluctant to consider injections, as he has had bad experiences in the past.  I am happy to provide injections, if he is interested in either steroid or HA injections.  This was discussed with the patient all questions have been answered.  In regards to his narcotic pain management, I have advised him that we will place a referral for him to be evaluated at a pain clinic.  I provided him with a refill today.  This was sent to his pharmacy as well as a muscle relaxer.  He states understanding, and will contact the clinic if he wishes to proceed with an injection, or needs anything else.  He should contact us  if he has not heard from the pain clinic.  Follow-up: No follow-ups on file.  Subjective:  Chief Complaint  Patient presents with   Knee Pain    Bilat    Back Pain    LBP     History of Present Illness: [name] is a 58 y.o. male who presents for evaluation of chronic low back pain and bilateral knee pain.  He has had pain in both the back and both knees for years.  He has been seeing Dr. Brenna.  He has been getting narcotics for years.  He is aware that he is not a surgical candidate, and primarily due to his weight.   Review of Systems: No fevers or chills No numbness or tingling No chest pain No shortness of breath No bowel or bladder dysfunction No GI distress No headaches   Medical History:  Past Medical History:  Diagnosis Date   Arthritis    Diabetes mellitus without complication (HCC)     diagnosed about age 32   Gout    Hypertension    boarderline   Sleep apnea    no CPAP    Past Surgical History:  Procedure Laterality Date   HERNIA REPAIR  2008   MASS EXCISION Left age 70   L thigh- benign   TOOTH EXTRACTION N/A 05/04/2024   Procedure: Dental Extractions of teeth number two, three, four, five, seven eight, nine, ten, elven, twelve, sixteen, seventeen, nineteen, twenty, twenty-nine, thirty, thirty-two; Alveoloplasty;  Surgeon: Sheryle Hamilton, DMD;  Location: [facility] OR;  Service: Oral Surgery;  Laterality: N/A;    Family History  Problem Relation Age of Onset   Cancer Mother    Diabetes Mother    Heart failure Father    Hypertension Father    Diabetes Father    Heart attack Father    Social History   Tobacco Use   Smoking status: Former    Current packs/day: 0.00    Average packs/day: 0.5 packs/day for 5.0 years (2.5 ttl pk-yrs)    Types: Cigarettes    Start date: 24    Quit date: 1990    Years since quitting: 35.7   Smokeless tobacco: Never  Vaping Use   Vaping status: Never Used  Substance Use Topics   Alcohol use: No  Drug use: No    No Known Allergies  Current Meds  Medication Sig   allopurinol  (ZYLOPRIM ) 300 MG tablet Take 1 tablet (300 mg total) by mouth daily. (Patient taking differently: Take 300 mg by mouth daily as needed (gout).)   amLODipine  (NORVASC ) 10 MG tablet TAKE 1 Tablet BY MOUTH ONCE EVERY DAY   amoxicillin  (AMOXIL ) 500 MG capsule Take 1 capsule (500 mg total) by mouth 3 (three) times daily.   atorvastatin  (LIPITOR) 80 MG tablet TAKE 1 Tablet BY MOUTH ONCE EVERY DAY   Continuous Glucose Receiver (DEXCOM G7 RECEIVER) DEVI USE TO MONITOR GLUCOSE CONTINUOUSLY AS DIRECTED.   Continuous Glucose Sensor (DEXCOM G7 SENSOR) MISC Inject 1 Application into the skin as directed. Change sensor every 10 days as directed.   Dulaglutide  (TRULICITY ) 4.5 MG/0.5ML SOAJ Inject 4.5 mg as directed once a week.   DULoxetine  (CYMBALTA ) 60 MG capsule  TAKE 1 CAPSULE BY MOUTH EVERY DAY   furosemide  (LASIX ) 20 MG tablet Take 1 tablet (20 mg total) by mouth daily.   gabapentin  (NEURONTIN ) 300 MG capsule Take 1 capsule (300 mg total) by mouth 3 (three) times daily.   glucose blood (TRUE METRIX BLOOD GLUCOSE TEST) test strip Patient is to use 1 test strip to monitor blood glucose 4 times daily .Use as instructed   glucose blood test strip Use as instructed to monitor glucose 4 times daily   insulin  aspart (NOVOLOG  FLEXPEN) 100 UNIT/ML FlexPen Inject 10-16 Units into the skin 3 (three) times daily with meals.   insulin  glargine (LANTUS ) 100 UNIT/ML injection Inject 0.4 mLs (40 Units total) into the skin daily.   Insulin  Pen Needle (PEN NEEDLES) 31G X 8 MM MISC Use to inject insulin  4 times daily   LINZESS 145 MCG CAPS capsule Take 145 mcg by mouth every morning.   lisinopril  (ZESTRIL ) 20 MG tablet TAKE 1 Tablet BY MOUTH ONCE EVERY DAY   metFORMIN  (GLUCOPHAGE -XR) 500 MG 24 hr tablet Take 2 tablets (1,000 mg total) by mouth daily with breakfast.   methocarbamol  (ROBAXIN ) 500 MG tablet Take 1 tablet (500 mg total) by mouth every 8 (eight) hours as needed for muscle spasms.   metoCLOPramide  (REGLAN ) 10 MG tablet Take 1 tablet (10 mg total) by mouth every 8 (eight) hours as needed for nausea.   NEEDLE, DISP, 18 G 18G X 1-1/2 MISC Use to draw up testosterone  for injection   oxyCODONE -acetaminophen  (PERCOCET) 10-325 MG tablet Take 1 tablet by mouth every 4 (four) hours as needed for pain.   sildenafil  (VIAGRA ) 100 MG tablet Take 1 tablet (100 mg total) by mouth daily as needed for erectile dysfunction.   SYRINGE-NEEDLE, DISP, 3 ML 22G X 1 3 ML MISC Use to inject testosterone    tadalafil  (CIALIS ) 20 MG tablet Take 1 tablet (20 mg total) by mouth daily as needed.   tadalafil  (CIALIS ) 5 MG tablet Take 1 tablet (5 mg total) by mouth daily as needed for erectile dysfunction.   testosterone  cypionate (DEPOTESTOSTERONE CYPIONATE) 200 MG/ML injection Inject 0.5  mLs (100 mg total) into the muscle every 7 (seven) days.    Objective: BP (!) 167/102   Pulse 92   Ht 5' 8 (1.727 m)   Wt (!) 350 lb (158.8 kg)   BMI 53.22 kg/m   Physical Exam:  General: Alert and oriented., No acute distress., and Obese male.  Gait: Slow, steady gait.  Bilateral knees without deformity.  Tenderness to palpation over the medial and lateral joint line.  Is good  range of motion.  Sensation intact distally.  Lower back is diffusely tender to palpation.  IMAGING: I personally ordered and reviewed the following images   X-rays of bilateral knees were obtained in clinic today.  No acute injuries are noted.  Mild varus alignment bilaterally. Near complete loss of joint space within the medial compartment.  There are associated osteophytes in all 3 compartments. No bony lesions.   Impression: Moderate bilateral knee arthritis.    New Medications:  Meds ordered this encounter  Medications   oxyCODONE -acetaminophen  (PERCOCET) 10-325 MG tablet    Sig: Take 1 tablet by mouth every 4 (four) hours as needed for pain.    Dispense:  30 tablet    Refill:  0   methocarbamol  (ROBAXIN ) 500 MG tablet    Sig: Take 1 tablet (500 mg total) by mouth every 8 (eight) hours as needed for muscle spasms.    Dispense:  60 tablet    Refill:  0      Oneil DELENA Horde, MD  06/13/2024 1:44 PM

## 2024-06-18 ENCOUNTER — Telehealth: Payer: Self-pay

## 2024-06-18 NOTE — Telephone Encounter (Signed)
 Patient stopped in the office to ask if Dr. Onesimo would do a prescription for him until he can get in with the pain clinic.  Oxycodone  10/325 MG   PATIENT [facility] [place] [facility]

## 2024-06-19 MED ORDER — OXYCODONE-ACETAMINOPHEN 10-325 MG PO TABS: 1.0000 | ORAL_TABLET | ORAL | 0 refills | Status: AC | PRN

## 2024-06-20 ENCOUNTER — Telehealth: Payer: Self-pay | Admitting: Nurse Practitioner

## 2024-06-20 ENCOUNTER — Other Ambulatory Visit (HOSPITAL_COMMUNITY): Payer: Self-pay

## 2024-06-20 NOTE — Telephone Encounter (Signed)
 Can you check in to this?  I am not sure what needs to be done here on our end

## 2024-06-20 NOTE — Telephone Encounter (Signed)
 Pt stated insurance is needing this information- How is pt doing on medication, any improvements?  Pt stated he called number on back of card but that he needs his trulicity  and they just told him this over the phone.  [phone]  Pt is completely out of medication

## 2024-06-20 NOTE — Telephone Encounter (Signed)
 PA has been resubmitted. Key: [id number]

## 2024-06-20 NOTE — Telephone Encounter (Signed)
 [facility] [place], he states that the patient needs a PA on the Trulicity . We have not seen anything in our office requesting this.  We will sent to the PA team and ask them to please to a PA on the patient's behalf.

## 2024-06-25 ENCOUNTER — Other Ambulatory Visit (HOSPITAL_COMMUNITY): Payer: Self-pay

## 2024-06-26 NOTE — Telephone Encounter (Signed)
 Pharmacy Patient Advocate Encounter  Received notification from [facility] MEDICAID that Prior Authorization for Trulicity  4.5MG /0.5ML auto-injectors has been APPROVED from 06-21-2024 to 06-21-2025   PA #/Case ID/Reference #: [id number]

## 2024-06-26 NOTE — Telephone Encounter (Signed)
 Patient was called and made aware.

## 2024-07-05 ENCOUNTER — Telehealth: Payer: Self-pay | Admitting: Urology

## 2024-07-05 NOTE — Telephone Encounter (Signed)
 Patient called into the office today with general questions/concerns regarding treatment plan with Dr Sherrilee. Patient said Dr told him to call if treatment plan is not working. He wants to be seen before February. Patient may be reached at [phone] to discuss questions.

## 2024-07-06 NOTE — Telephone Encounter (Signed)
 Return call to pt about his treatment plan. Pt state's Dr. Sherrilee spoke to him about a procedure but not sure of what procedure. Pt was made aware he has an appointment and he was put on the cancellation list for a sooner appointment. Voiced understanding

## 2024-07-09 ENCOUNTER — Encounter: Payer: Self-pay | Admitting: Radiology

## 2024-08-07 ENCOUNTER — Encounter: Payer: Self-pay | Admitting: Nurse Practitioner

## 2024-08-07 ENCOUNTER — Ambulatory Visit: Admitting: Nurse Practitioner

## 2024-08-07 ENCOUNTER — Other Ambulatory Visit: Payer: Self-pay | Admitting: Urology

## 2024-08-07 VITALS — BP 118/82 | HR 88 | Ht 68.0 in | Wt 350.2 lb

## 2024-08-07 DIAGNOSIS — Z7985 Long-term (current) use of injectable non-insulin antidiabetic drugs: Secondary | ICD-10-CM

## 2024-08-07 DIAGNOSIS — E782 Mixed hyperlipidemia: Secondary | ICD-10-CM

## 2024-08-07 DIAGNOSIS — I10 Essential (primary) hypertension: Secondary | ICD-10-CM | POA: Diagnosis not present

## 2024-08-07 DIAGNOSIS — Z7984 Long term (current) use of oral hypoglycemic drugs: Secondary | ICD-10-CM

## 2024-08-07 DIAGNOSIS — E1165 Type 2 diabetes mellitus with hyperglycemia: Secondary | ICD-10-CM

## 2024-08-07 DIAGNOSIS — Z794 Long term (current) use of insulin: Secondary | ICD-10-CM

## 2024-08-07 DIAGNOSIS — N5201 Erectile dysfunction due to arterial insufficiency: Secondary | ICD-10-CM

## 2024-08-07 LAB — POCT GLYCOSYLATED HEMOGLOBIN (HGB A1C): Hemoglobin A1C: 8.3 % — AB (ref 4.0–5.6)

## 2024-08-07 LAB — POCT UA - MICROALBUMIN
Albumin/Creatinine Ratio, Urine, POC: <30
Creatinine, POC: 300 mg/dL
Microalbumin Ur, POC: 30 mg/L

## 2024-08-07 MED ORDER — DEXCOM G7 SENSOR MISC: 1.0000 | 3 refills | Status: AC

## 2024-08-07 MED ORDER — SEMAGLUTIDE (1 MG/DOSE) 4 MG/3ML ~~LOC~~ SOPN: 1.0000 mg | PEN_INJECTOR | SUBCUTANEOUS | 1 refills | Status: DC

## 2024-08-07 MED ORDER — INSULIN GLARGINE 100 UNIT/ML ~~LOC~~ SOLN: 40.0000 [IU] | Freq: Every day | SUBCUTANEOUS | 3 refills | Status: DC

## 2024-08-07 MED ORDER — PEN NEEDLES 31G X 8 MM MISC: 6 refills | Status: AC

## 2024-08-07 MED ORDER — METFORMIN HCL ER 500 MG PO TB24: 1000.0000 mg | ORAL_TABLET | Freq: Every day | ORAL | 3 refills | Status: AC

## 2024-08-07 MED ORDER — NOVOLOG FLEXPEN 100 UNIT/ML ~~LOC~~ SOPN: 10.0000 [IU] | PEN_INJECTOR | Freq: Three times a day (TID) | SUBCUTANEOUS | 3 refills | Status: DC

## 2024-08-07 MED ORDER — INSULIN SYRINGE 29G X 1/2" 0.5 ML MISC: 3 refills | Status: AC

## 2024-08-07 NOTE — Progress Notes (Signed)
 08/07/2024, 2:36 PM      Endocrinology Follow Up Visit  Subjective:    Patient ID: [name], male    DOB: 05-Apr-1966.  [name] is being seen in follow up for management of currently uncontrolled symptomatic diabetes requested by  Leigh Lung, MD.   Past Medical History:  Diagnosis Date   Arthritis    Diabetes mellitus without complication [facility])    diagnosed about age 58   Gout    Hypertension    boarderline   Sleep apnea    no CPAP    Past Surgical History:  Procedure Laterality Date   HERNIA REPAIR  2008   MASS EXCISION Left age 56   L thigh- benign   TOOTH EXTRACTION N/A 05/04/2024   Procedure: Dental Extractions of teeth number two, three, four, five, seven eight, nine, ten, elven, twelve, sixteen, seventeen, nineteen, twenty, twenty-nine, thirty, thirty-two; Alveoloplasty;  Surgeon: Sheryle Hamilton, DMD;  Location: [facility] OR;  Service: Oral Surgery;  Laterality: N/A;    Social History   Socioeconomic History   Marital status: Single    Spouse name: Not on file   Number of children: Not on file   Years of education: Not on file   Highest education level: Not on file  Occupational History   Not on file  Tobacco Use   Smoking status: Former    Current packs/day: 0.00    Average packs/day: 0.5 packs/day for 5.0 years (2.5 ttl pk-yrs)    Types: Cigarettes    Start date: 24    Quit date: 73    Years since quitting: 35.9   Smokeless tobacco: Never  Vaping Use   Vaping status: Never Used  Substance and Sexual Activity   Alcohol use: No   Drug use: No   Sexual activity: Not on file  Other Topics Concern   Not on file  Social History Narrative   Not on file   Social Drivers of Health   Financial Resource Strain: Not on file  Food Insecurity: Not on file  Transportation Needs: Not on file  Physical Activity: Not on file  Stress: Not on file  Social Connections: Unknown (02/09/2023)    Received from [facility]   Social Network    Social Network: Not on file    Family History  Problem Relation Age of Onset   Cancer Mother    Diabetes Mother    Heart failure Father    Hypertension Father    Diabetes Father    Heart attack Father     Outpatient Encounter Medications as of 08/07/2024  Medication Sig   allopurinol  (ZYLOPRIM ) 300 MG tablet Take 1 tablet (300 mg total) by mouth daily. (Patient taking differently: Take 300 mg by mouth daily as needed (gout).)   amLODipine  (NORVASC ) 10 MG tablet TAKE 1 Tablet BY MOUTH ONCE EVERY DAY   atorvastatin  (LIPITOR) 80 MG tablet TAKE 1 Tablet BY MOUTH ONCE EVERY DAY   Continuous Glucose Receiver (DEXCOM G7 RECEIVER) DEVI USE TO MONITOR GLUCOSE CONTINUOUSLY AS DIRECTED.   DULoxetine  (CYMBALTA ) 60 MG capsule TAKE 1 CAPSULE BY MOUTH EVERY DAY   furosemide  (LASIX ) 20 MG tablet Take 1 tablet (20 mg total) by  mouth daily.   gabapentin  (NEURONTIN ) 300 MG capsule Take 1 capsule (300 mg total) by mouth 3 (three) times daily.   glucose blood (TRUE METRIX BLOOD GLUCOSE TEST) test strip Patient is to use 1 test strip to monitor blood glucose 4 times daily .Use as instructed   glucose blood test strip Use as instructed to monitor glucose 4 times daily   INSULIN  SYRINGE .5CC/29G 29G X 1/2 0.5 ML MISC Use to inject insulin  once daily   LINZESS 145 MCG CAPS capsule Take 145 mcg by mouth every morning.   lisinopril  (ZESTRIL ) 20 MG tablet TAKE 1 Tablet BY MOUTH ONCE EVERY DAY   methocarbamol  (ROBAXIN ) 500 MG tablet Take 1 tablet (500 mg total) by mouth every 8 (eight) hours as needed for muscle spasms.   metoCLOPramide  (REGLAN ) 10 MG tablet Take 1 tablet (10 mg total) by mouth every 8 (eight) hours as needed for nausea.   NEEDLE, DISP, 18 G 18G X 1-1/2 MISC Use to draw up testosterone  for injection   oxyCODONE -acetaminophen  (PERCOCET) 10-325 MG tablet Take 1 tablet by mouth every 4 (four) hours as needed for pain.   Semaglutide, 1 MG/DOSE, 4  MG/3ML SOPN Inject 1 mg as directed once a week.   sildenafil  (VIAGRA ) 100 MG tablet Take 1 tablet (100 mg total) by mouth daily as needed for erectile dysfunction.   SYRINGE-NEEDLE, DISP, 3 ML 22G X 1 3 ML MISC Use to inject testosterone    tadalafil  (CIALIS ) 20 MG tablet Take 1 tablet (20 mg total) by mouth daily as needed.   tadalafil  (CIALIS ) 5 MG tablet Take 1 tablet (5 mg total) by mouth daily as needed for erectile dysfunction.   testosterone  cypionate (DEPOTESTOSTERONE CYPIONATE) 200 MG/ML injection Inject 0.5 mLs (100 mg total) into the muscle every 7 (seven) days.   [DISCONTINUED] Continuous Glucose Sensor (DEXCOM G7 SENSOR) MISC Inject 1 Application into the skin as directed. Change sensor every 10 days as directed.   [DISCONTINUED] Dulaglutide  (TRULICITY ) 4.5 MG/0.5ML SOAJ Inject 4.5 mg as directed once a week.   [DISCONTINUED] insulin  aspart (NOVOLOG  FLEXPEN) 100 UNIT/ML FlexPen Inject 10-16 Units into the skin 3 (three) times daily with meals.   [DISCONTINUED] insulin  glargine (LANTUS ) 100 UNIT/ML injection Inject 0.4 mLs (40 Units total) into the skin daily.   [DISCONTINUED] Insulin  Pen Needle (PEN NEEDLES) 31G X 8 MM MISC Use to inject insulin  4 times daily   [DISCONTINUED] metFORMIN  (GLUCOPHAGE -XR) 500 MG 24 hr tablet Take 2 tablets (1,000 mg total) by mouth daily with breakfast.   Continuous Glucose Sensor (DEXCOM G7 SENSOR) MISC Inject 1 Application into the skin as directed. Change sensor every 10 days as directed.   insulin  aspart (NOVOLOG  FLEXPEN) 100 UNIT/ML FlexPen Inject 10-16 Units into the skin 3 (three) times daily with meals.   insulin  glargine (LANTUS ) 100 UNIT/ML injection Inject 0.4 mLs (40 Units total) into the skin daily.   Insulin  Pen Needle (PEN NEEDLES) 31G X 8 MM MISC Use to inject insulin  4 times daily   metFORMIN  (GLUCOPHAGE -XR) 500 MG 24 hr tablet Take 2 tablets (1,000 mg total) by mouth daily with breakfast.   [DISCONTINUED] amoxicillin  (AMOXIL ) 500 MG  capsule Take 1 capsule (500 mg total) by mouth 3 (three) times daily. (Patient not taking: Reported on 08/07/2024)   No facility-administered encounter medications on file as of 08/07/2024.    ALLERGIES: No Known Allergies   VACCINATION STATUS: Immunization History  Administered Date(s) Administered   Influenza Inj Mdck Quad Pf 06/13/2019   Influenza,inj,Quad  PF,6+ Mos 06/30/2017, 07/08/2018   Moderna Sars-Covid-2 Vaccination 04/01/2020, 04/29/2020    Diabetes He presents for his follow-up diabetic visit. He has type 2 diabetes mellitus. Onset time: He was diagnosed at approximate age of 49 years. His disease course has been fluctuating. Pertinent negatives for hypoglycemia include no confusion, pallor, seizures or tremors. Associated symptoms include fatigue. Pertinent negatives for diabetes include no polydipsia, no polyphagia, no polyuria, no weakness and no weight loss. There are no hypoglycemic complications. Symptoms are improving. There are no diabetic complications. Risk factors for coronary artery disease include dyslipidemia, diabetes mellitus, hypertension, male sex, obesity and sedentary lifestyle. Current diabetic treatment includes intensive insulin  program and oral agent (monotherapy) (and Trulicity ). He is compliant with treatment most of the time. His weight is fluctuating minimally. He is following a generally unhealthy diet. When asked about meal planning, he reported none. He has not had a previous visit with a dietitian. He never participates in exercise. His overall blood glucose range is 140-180 mg/dl. (He presents today with his CGM showing fluctuating, but mostly at target glycemic profile overall.  His POCT A1c today is 8.3%, improving from last visit of 8.5%.  Analysis of his CGM shows TIR 69%, TAR 31%, TBR 0% with a GMI of 7.3%.  He denies any significant hypoglycemia.  He did have oral surgery, is waiting on dentures.  He admits to cheating on his diet during  Thanksgiving.) An ACE inhibitor/angiotensin II receptor blocker is being taken. He does not see a podiatrist.Eye exam is current.    Review of systems  Constitutional: + stable body weight,  current Body mass index is 53.25 kg/m., + fatigue, no subjective hyperthermia, no subjective hypothermia Eyes: + blurry vision, no xerophthalmia ENT: no sore throat, no nodules palpated in throat, no dysphagia/odynophagia, no hoarseness Cardiovascular: no chest pain, no shortness of breath, no palpitations, no leg swelling Respiratory: no cough, no shortness of breath Gastrointestinal: no nausea/vomiting/diarrhea Musculoskeletal: + generalized muscle/joint aches Skin: no rashes, no hyperemia Neurological: no tremors, no numbness, no tingling, no dizziness Psychiatric: no depression, no anxiety    Objective:    BP 118/82 (BP Location: Left Arm, Patient Position: Sitting, Cuff Size: Large)   Pulse 88   Ht 5' 8 (1.727 m)   Wt (!) 350 lb 3.2 oz (158.8 kg)   BMI 53.25 kg/m   Wt Readings from Last 3 Encounters:  08/07/24 (!) 350 lb 3.2 oz (158.8 kg)  06/12/24 (!) 350 lb (158.8 kg)  05/14/24 (!) 342 lb (155.1 kg)    BP Readings from Last 3 Encounters:  08/07/24 118/82  06/12/24 (!) 167/102  05/23/24 (!) 153/89      Physical Exam- Limited  Constitutional:  Body mass index is 53.25 kg/m. , not in acute distress, normal state of mind Eyes:  EOMI, no exophthalmos Musculoskeletal: no gross deformities, strength intact in all four extremities, no gross restriction of joint movements Skin:  no rashes, no hyperemia Neurological: no tremor with outstretched hands   CMP ( most recent) CMP     Component Value Date/Time   NA 137 05/04/2024 0651   NA 141 12/20/2023 1129   K 4.0 05/04/2024 0651   CL 103 05/04/2024 0651   CO2 24 05/04/2024 0651   GLUCOSE 124 (H) 05/04/2024 0651   BUN 9 05/04/2024 0651   BUN 8 12/20/2023 1129   CREATININE 0.78 05/04/2024 0651   CREATININE 1.42 (H)  09/08/2017 1540   CALCIUM  8.4 (L) 05/04/2024 0651   PROT 7.0 12/20/2023  1129   ALBUMIN 4.1 12/20/2023 1129   AST 15 12/20/2023 1129   ALT 24 12/20/2023 1129   ALKPHOS 158 (H) 12/20/2023 1129   BILITOT 0.4 12/20/2023 1129   GFRNONAA >60 05/04/2024 0651   GFRAA >60 03/17/2020 1200     Diabetic Labs (most recent): Lab Results  Component Value Date   HGBA1C 8.3 (A) 08/07/2024   HGBA1C 8.0 (H) 12/20/2023   HGBA1C 8.3 (A) 11/29/2023   MICROALBUR 30 mg/L 08/07/2024   MICROALBUR 7.2 (H) 07/19/2022   MICROALBUR <3.0 (H) 07/07/2021     Lipid Panel ( most recent) Lipid Panel     Component Value Date/Time   CHOL 263 (H) 07/19/2022 1337   TRIG 77 07/19/2022 1337   HDL 62 07/19/2022 1337   CHOLHDL 4.2 07/19/2022 1337   VLDL 15 07/19/2022 1337   LDLCALC 186 (H) 07/19/2022 1337      Lab Results  Component Value Date   TSH 0.922 12/20/2023   TSH 0.532 06/28/2017       Assessment & Plan:   1) Uncontrolled type 2 diabetes mellitus with hyperglycemia (HCC)  - [name] has currently uncontrolled symptomatic type 2 DM since  58 years of age.  He presents today with his CGM showing fluctuating, but mostly at target glycemic profile overall.  His POCT A1c today is 8.3%, improving from last visit of 8.5%.  Analysis of his CGM shows TIR 69%, TAR 31%, TBR 0% with a GMI of 7.3%.  He denies any significant hypoglycemia.  He did have oral surgery, is waiting on dentures.  He admits to cheating on his diet during Thanksgiving.  -Recent labs reviewed.  - I had a long discussion with him about the progressive nature of diabetes and the pathology behind its complications. -his diabetes is complicated by obesity/sedentary life, inadequate insurance coverage,  And he remains at a high risk for more acute and chronic complications which include CAD, CVA, CKD, retinopathy, and neuropathy. These are all discussed in detail with him.  - Nutritional counseling repeated/built upon at each  appointment.  - The patient admits there is a room for improvement in their diet and drink choices. -  Suggestion is made for the patient to avoid simple carbohydrates from their diet including Cakes, Sweet Desserts / Pastries, Ice Cream, Soda (diet and regular), Sweet Tea, Candies, Chips, Cookies, Sweet Pastries, Store Bought Juices, Alcohol in Excess of 1-2 drinks a day, Artificial Sweeteners, Coffee Creamer, and Sugar-free Products. This will help patient to have stable blood glucose profile and potentially avoid unintended weight gain.   - I encouraged the patient to switch to unprocessed or minimally processed complex starch and increased protein intake (animal or plant source), fruits, and vegetables.   - Patient is advised to stick to a routine mealtimes to eat 3 meals a day and avoid unnecessary snacks (to snack only to correct hypoglycemia).  - he will be scheduled with Santana Duke, RDN, CDE for diabetes education.  - I have approached him with the following individualized plan to manage  his diabetes and patient agrees:   -He is advised to continue Lantus  40 units SQ nightly, continue Novolog  10-16 units TID with meals if glucose is above 90 and he is eating (Specific instructions on how to titrate insulin  dosage based on glucose readings given to patient in writing), continue Metformin  1000 mg ER PO daily with breakfast.  Will change him from Trulicity  to Ozempic 1 mg SQ weekly (does not need to start  on lowest dose as he has been on max dose of Trulicity ).  He is encouraged consistently monitor blood glucose 4 times per day (using his CGM), before meals and at bedtime and report to the clinic if readings are less than 70 or greater than 300 for 3 tests in a row.   - Specific targets for  A1c;  LDL, HDL,  and Triglycerides were discussed with the patient.  2) Blood Pressure /Hypertension:   His blood pressure is controlled to target.  He is advised to continue meds as prescribed  by his PCP.  3) Lipids/Hyperlipidemia:   His recent lipid panel from 07/19/22 shows uncontrolled LDL of 186.  He is advised to continue Atorvastatin  40 mg po daily at bedtime.  Side effects and precautions discussed with him.    4)  Weight/Diet:  His Body mass index is 53.25 kg/m.-   clearly complicating his diabetes care.   he is  a candidate for modest weight loss. I discussed with him the fact that loss of 5 - 10% of his  current body weight will have the most impact on his diabetes management.  Exercise, and detailed carbohydrates information provided  -  detailed on discharge instructions.  He is a good candidate for bariatric surgery, however at this point he has no insurance.  5) Chronic Care/Health Maintenance: -he is on ACEI/ARB and Statin medications and is encouraged to initiate and continue to follow up with Ophthalmology, Dentist,  Podiatrist at least yearly or according to recommendations, and advised to stay away from smoking. I have recommended yearly flu vaccine and pneumonia vaccine at least every 5 years; moderate intensity exercise for up to 150 minutes weekly; and  sleep for at least 7 hours a day.  - he is advised to maintain close follow up with Leigh Lung, MD for primary care needs, as well as his other providers for optimal and coordinated care.    I spent  35  minutes in the care of the patient today including review of labs from CMP, Lipids, Thyroid Function, Hematology (current and previous including abstractions from other facilities); face-to-face time discussing  his blood glucose readings/logs, discussing hypoglycemia and hyperglycemia episodes and symptoms, medications doses, his options of short and long term treatment based on the latest standards of care / guidelines;  discussion about incorporating lifestyle medicine;  and documenting the encounter. Risk reduction counseling performed per USPSTF guidelines to reduce obesity and cardiovascular risk factors.      Please refer to Patient Instructions for Blood Glucose Monitoring and Insulin /Medications Dosing Guide  in media tab for additional information. Please  also refer to  Patient Self Inventory in the Media  tab for reviewed elements of pertinent patient history.  [name] participated in the discussions, expressed understanding, and voiced agreement with the above plans.  All questions were answered to his satisfaction. he is encouraged to contact clinic should he have any questions or concerns prior to his return visit.   Follow up plan: - Return in about 4 months (around 12/06/2024) for Diabetes F/U with A1c in office, No previsit labs, Bring meter and logs.  Benton Rio, [facility] [address] [place], [place] [address] Phone: [phone] Fax: [phone]  08/07/2024, 2:36 PM

## 2024-08-08 ENCOUNTER — Ambulatory Visit: Admitting: Orthopedic Surgery

## 2024-08-13 ENCOUNTER — Ambulatory Visit: Admitting: Podiatry

## 2024-08-13 ENCOUNTER — Encounter: Payer: Self-pay | Admitting: Podiatry

## 2024-08-13 VITALS — Ht 68.0 in | Wt 350.2 lb

## 2024-08-13 DIAGNOSIS — B351 Tinea unguium: Secondary | ICD-10-CM

## 2024-08-13 DIAGNOSIS — B353 Tinea pedis: Secondary | ICD-10-CM | POA: Diagnosis not present

## 2024-08-13 DIAGNOSIS — M79674 Pain in right toe(s): Secondary | ICD-10-CM | POA: Diagnosis not present

## 2024-08-13 DIAGNOSIS — M79675 Pain in left toe(s): Secondary | ICD-10-CM | POA: Diagnosis not present

## 2024-08-13 MED ORDER — CLOTRIMAZOLE-BETAMETHASONE 1-0.05 % EX CREA: 1.0000 | TOPICAL_CREAM | Freq: Every day | CUTANEOUS | 3 refills | Status: AC

## 2024-08-13 NOTE — Progress Notes (Signed)
   Chief Complaint  Patient presents with   Nail Problem    Pt is here for [facility].    SUBJECTIVE Patient with a history of diabetes mellitus presents to office today complaining of elongated, thickened nails that cause pain while ambulating in shoes.  Patient is unable to trim their own nails. Patient is here for further evaluation and treatment.  Past Medical History:  Diagnosis Date   Arthritis    Diabetes mellitus without complication (HCC)    diagnosed about age 58   Gout    Hypertension    boarderline   Sleep apnea    no CPAP    No Known Allergies   OBJECTIVE General Patient is awake, alert, and oriented x 3 and in no acute distress. Derm diffuse hyperkeratotic peeling skin noted to the bilateral feet nails are tender, long, thickened and dystrophic with subungual debris, consistent with onychomycosis, 1-5 bilateral. No signs of infection noted. Vasc  DP and PT pedal pulses palpable bilaterally. Temperature gradient within normal limits.  Neuro Epicritic and protective threshold sensation diminished bilaterally.  Musculoskeletal Exam No symptomatic pedal deformities noted bilateral. Muscular strength within normal limits.  ASSESSMENT 1. Diabetes Mellitus w/ peripheral neuropathy 2.  Pain due to onychomycosis of toenails bilateral 3.  Chronic tinea pedis bilateral  PLAN OF CARE -Patient evaluated today. -Instructed to maintain good pedal hygiene and foot care. Stressed importance of controlling blood sugar.  -Mechanical debridement of nails 1-5 bilaterally performed using a nail nipper. Filed with dremel without incident. -Prescription for Lotrisone  cream apply twice daily -Return to clinic in 3 mos.     Thresa EMERSON Sar, DPM [facility]  Dr. Thresa EMERSON Sar, DPM    [address] [place], [place] [address]                Office [phone]  Fax [phone]

## 2024-09-12 ENCOUNTER — Other Ambulatory Visit: Payer: Self-pay | Admitting: Neurology

## 2024-09-12 NOTE — Telephone Encounter (Signed)
 Last seen on 12/20/23 No follow up scheduled

## 2024-09-27 ENCOUNTER — Telehealth: Payer: Self-pay | Admitting: Nurse Practitioner

## 2024-09-27 NOTE — Telephone Encounter (Signed)
 Pt has called stating he has been waiting on a PA for Ozempic , as was asking about the status

## 2024-10-01 ENCOUNTER — Telehealth: Payer: Self-pay

## 2024-10-01 ENCOUNTER — Other Ambulatory Visit (HOSPITAL_COMMUNITY): Payer: Self-pay

## 2024-10-01 ENCOUNTER — Other Ambulatory Visit: Payer: Self-pay | Admitting: Nurse Practitioner

## 2024-10-01 NOTE — Telephone Encounter (Signed)
 Pharmacy Patient Advocate Encounter   Received notification from RX Request Messages that prior authorization for Novolog  is required/requested.   Insurance verification completed.   The patient is insured through [facility] MEDICAID.   Per test claim: The current 30 day co-pay is, $4.  No PA needed at this time. This test claim was processed through [facility]- copay amounts may vary at other pharmacies due to pharmacy/plan contracts, or as the patient moves through the different stages of their insurance plan.     Insurance prefers the generic Novolog  (insulin  aspart)

## 2024-10-01 NOTE — Telephone Encounter (Signed)
 Pharmacy Patient Advocate Encounter   Received notification from Pt Calls Messages that prior authorization for Ozempic  (1 MG/DOSE) 4MG /3ML pen-injectors is required/requested.   Insurance verification completed.   The patient is insured through [facility] MEDICAID.   Per test claim: PA required and submitted KEY/EOC/Request #: [id number] due to Prior authorization not required. Copay is $4

## 2024-10-02 NOTE — Telephone Encounter (Signed)
 Addressed in new encounter.

## 2024-10-03 ENCOUNTER — Other Ambulatory Visit: Payer: Self-pay | Admitting: *Deleted

## 2024-10-03 ENCOUNTER — Other Ambulatory Visit (HOSPITAL_COMMUNITY): Payer: Self-pay

## 2024-10-03 DIAGNOSIS — I10 Essential (primary) hypertension: Secondary | ICD-10-CM

## 2024-10-03 DIAGNOSIS — E1165 Type 2 diabetes mellitus with hyperglycemia: Secondary | ICD-10-CM

## 2024-10-03 DIAGNOSIS — Z7984 Long term (current) use of oral hypoglycemic drugs: Secondary | ICD-10-CM

## 2024-10-03 DIAGNOSIS — Z794 Long term (current) use of insulin: Secondary | ICD-10-CM

## 2024-10-03 DIAGNOSIS — Z7985 Long-term (current) use of injectable non-insulin antidiabetic drugs: Secondary | ICD-10-CM

## 2024-10-03 DIAGNOSIS — E782 Mixed hyperlipidemia: Secondary | ICD-10-CM

## 2024-10-03 MED ORDER — INSULIN GLARGINE 100 UNIT/ML ~~LOC~~ SOLN
40.0000 [IU] | Freq: Every day | SUBCUTANEOUS | 3 refills | Status: AC
  Filled 2024-10-03 – 2024-10-10 (×2): qty 40, 100d supply, fill #0

## 2024-10-03 MED ORDER — SEMAGLUTIDE (1 MG/DOSE) 4 MG/3ML ~~LOC~~ SOPN
1.0000 mg | PEN_INJECTOR | SUBCUTANEOUS | 1 refills | Status: AC
  Filled 2024-10-03: qty 3, 28d supply, fill #0
  Filled 2024-10-09 – 2024-10-12 (×4): qty 9, 84d supply, fill #0

## 2024-10-03 MED ORDER — NOVOLOG FLEXPEN 100 UNIT/ML ~~LOC~~ SOPN
10.0000 [IU] | PEN_INJECTOR | Freq: Three times a day (TID) | SUBCUTANEOUS | 1 refills | Status: AC
  Filled 2024-10-03: qty 15, 30d supply, fill #0
  Filled 2024-10-10: qty 45, 93d supply, fill #0
  Filled 2024-10-11: qty 45, 90d supply, fill #0
  Filled 2024-10-12: qty 42, 87d supply, fill #0

## 2024-10-03 NOTE — Telephone Encounter (Signed)
 Per the patient's request, he is asking that his insulin  and Ozempic  be sent to the [facility]. I have called them and talked with Daphne . She is waiting to get the prescriptions, once reviewed she will let us  know if there are any issues. Otherwise , they will process and reach out tot he patient for additional information. She is aware that patient is very interested in the home delivery.

## 2024-10-03 NOTE — Telephone Encounter (Signed)
 Patient was called and made aware, he shares that the [facility] is telling him that a PA is required. He is going to call them back and then will call our office back. Patient has interest in one of the [facility].

## 2024-10-03 NOTE — Telephone Encounter (Signed)
 Pt wants his ozempic  sent to [facility]. Also, can this be mailed to him?

## 2024-10-04 ENCOUNTER — Telehealth (HOSPITAL_COMMUNITY): Payer: Self-pay

## 2024-10-04 ENCOUNTER — Other Ambulatory Visit (HOSPITAL_COMMUNITY): Payer: Self-pay

## 2024-10-04 ENCOUNTER — Telehealth: Payer: Self-pay | Admitting: *Deleted

## 2024-10-04 NOTE — Telephone Encounter (Signed)
 We are resending to the RX PA Team a request for the Ozempic  , it will need a PA per [facility]/Daphne. This is a urgent request,please, as the patient is out of this medication. Refer to previous note for further information. Thank you

## 2024-10-04 NOTE — Telephone Encounter (Signed)
 Noted. [facility] and the patient have been made aware.

## 2024-10-04 NOTE — Telephone Encounter (Addendum)
 I have been informed that generic novolog  has been discontinued. I have submitted a PA for brand. Key: [id number]

## 2024-10-04 NOTE — Telephone Encounter (Signed)
 Please refer to the previous note, dated 10/01/24, for additional information. We are needing urgent PA for the patient's Ozempic . It is needed per [facility] with [facility],

## 2024-10-04 NOTE — Telephone Encounter (Signed)
 The previous PA for this medication was cancelled by the insurance stating a PA was not required, however, when I run the test claim today, it is showing that a PA is required. I have attempted to resubmit. Key: [id number]

## 2024-10-05 NOTE — Telephone Encounter (Signed)
 Pharmacy Patient Advocate Encounter  Received notification from [facility] MEDICAID that Prior Authorization for Ozempic  (1mg /DOSE) 4mg /64ml has been APPROVED from 10/04/24 to 10/04/2025

## 2024-10-05 NOTE — Telephone Encounter (Signed)
 Pharmacy Patient Advocate Encounter  Received notification from [facility] MEDICAID that Prior Authorization for Novolog  has been APPROVED from 10/04/2024 to 10/04/2025

## 2024-10-09 ENCOUNTER — Other Ambulatory Visit (HOSPITAL_COMMUNITY): Payer: Self-pay

## 2024-10-09 ENCOUNTER — Telehealth: Payer: Self-pay | Admitting: *Deleted

## 2024-10-09 NOTE — Telephone Encounter (Signed)
 Any word on the patient's Ozempic ?

## 2024-10-09 NOTE — Telephone Encounter (Signed)
 Patient was called and made aware.

## 2024-10-09 NOTE — Telephone Encounter (Signed)
 We do see that the patient's insulin  and Ozempic  have been approved. Patient was called and made aware. He shares that he has been approved for Dexcom, he remembers getting a letter that he is approved, But he is being told that he needs a PA for the Dexcom G7 again. Patient has 2 left.

## 2024-10-10 ENCOUNTER — Other Ambulatory Visit: Payer: Self-pay

## 2024-10-11 ENCOUNTER — Other Ambulatory Visit (HOSPITAL_COMMUNITY): Payer: Self-pay

## 2024-10-11 ENCOUNTER — Other Ambulatory Visit: Payer: Self-pay

## 2024-10-12 ENCOUNTER — Other Ambulatory Visit: Payer: Self-pay

## 2024-11-12 ENCOUNTER — Ambulatory Visit: Admitting: Podiatry

## 2024-11-15 ENCOUNTER — Other Ambulatory Visit

## 2024-11-21 ENCOUNTER — Ambulatory Visit: Admitting: Urology

## 2024-12-06 ENCOUNTER — Ambulatory Visit: Admitting: Nurse Practitioner
